# Patient Record
Sex: Female | Born: 1950 | Race: White | Hispanic: No | Marital: Single | Smoking: Former smoker
Health system: Southern US, Community
[De-identification: ages and names within clinical notes are randomized; demographics above are authoritative.]

## PROBLEM LIST (undated history)

## (undated) DIAGNOSIS — Z974 Presence of external hearing-aid: Secondary | ICD-10-CM

## (undated) DIAGNOSIS — Z972 Presence of dental prosthetic device (complete) (partial): Secondary | ICD-10-CM

## (undated) DIAGNOSIS — C801 Malignant (primary) neoplasm, unspecified: Secondary | ICD-10-CM

## (undated) DIAGNOSIS — K08109 Complete loss of teeth, unspecified cause, unspecified class: Secondary | ICD-10-CM

## (undated) DIAGNOSIS — G43909 Migraine, unspecified, not intractable, without status migrainosus: Secondary | ICD-10-CM

## (undated) DIAGNOSIS — Z973 Presence of spectacles and contact lenses: Secondary | ICD-10-CM

## (undated) DIAGNOSIS — I219 Acute myocardial infarction, unspecified: Secondary | ICD-10-CM

## (undated) DIAGNOSIS — B1920 Unspecified viral hepatitis C without hepatic coma: Secondary | ICD-10-CM

## (undated) DIAGNOSIS — I639 Cerebral infarction, unspecified: Secondary | ICD-10-CM

## (undated) DIAGNOSIS — I10 Essential (primary) hypertension: Secondary | ICD-10-CM

## (undated) HISTORY — PX: ABDOMINAL HYSTERECTOMY: SHX81

## (undated) HISTORY — PX: MULTIPLE TOOTH EXTRACTIONS: SHX2053

## (undated) HISTORY — PX: BUNIONECTOMY: SHX129

## (undated) HISTORY — PX: COLONOSCOPY: SHX174

---

## 2013-12-28 DIAGNOSIS — I639 Cerebral infarction, unspecified: Secondary | ICD-10-CM

## 2013-12-28 HISTORY — DX: Cerebral infarction, unspecified: I63.9

## 2014-09-11 ENCOUNTER — Encounter (HOSPITAL_COMMUNITY): Payer: Medicare Other | Admitting: Anesthesiology

## 2014-09-11 ENCOUNTER — Inpatient Hospital Stay (HOSPITAL_COMMUNITY): Payer: Medicare Other

## 2014-09-11 ENCOUNTER — Emergency Department (HOSPITAL_COMMUNITY): Payer: Medicare Other

## 2014-09-11 ENCOUNTER — Inpatient Hospital Stay (HOSPITAL_COMMUNITY)
Admission: EM | Admit: 2014-09-11 | Discharge: 2014-09-24 | DRG: 003 | Disposition: A | Discharge status: Rehabilitation Facility | Payer: Medicare Other | Attending: Neurosurgery | Admitting: Neurosurgery

## 2014-09-11 ENCOUNTER — Inpatient Hospital Stay (HOSPITAL_COMMUNITY): Payer: Medicare Other | Admitting: Anesthesiology

## 2014-09-11 ENCOUNTER — Encounter (HOSPITAL_COMMUNITY): Payer: Self-pay | Admitting: Emergency Medicine

## 2014-09-11 ENCOUNTER — Encounter (HOSPITAL_COMMUNITY)
Admission: EM | Disposition: A | Discharge status: Rehabilitation Facility | Payer: Self-pay | Source: Home / Self Care | Attending: Neurosurgery

## 2014-09-11 DIAGNOSIS — I509 Heart failure, unspecified: Secondary | ICD-10-CM | POA: Diagnosis present

## 2014-09-11 DIAGNOSIS — R414 Neurologic neglect syndrome: Secondary | ICD-10-CM | POA: Diagnosis present

## 2014-09-11 DIAGNOSIS — J4489 Other specified chronic obstructive pulmonary disease: Secondary | ICD-10-CM | POA: Diagnosis present

## 2014-09-11 DIAGNOSIS — R633 Feeding difficulties: Secondary | ICD-10-CM

## 2014-09-11 DIAGNOSIS — I5181 Takotsubo syndrome: Secondary | ICD-10-CM | POA: Diagnosis present

## 2014-09-11 DIAGNOSIS — I10 Essential (primary) hypertension: Secondary | ICD-10-CM | POA: Diagnosis present

## 2014-09-11 DIAGNOSIS — J449 Chronic obstructive pulmonary disease, unspecified: Secondary | ICD-10-CM | POA: Diagnosis present

## 2014-09-11 DIAGNOSIS — B1920 Unspecified viral hepatitis C without hepatic coma: Secondary | ICD-10-CM | POA: Diagnosis present

## 2014-09-11 DIAGNOSIS — E872 Acidosis, unspecified: Secondary | ICD-10-CM | POA: Diagnosis present

## 2014-09-11 DIAGNOSIS — I609 Nontraumatic subarachnoid hemorrhage, unspecified: Secondary | ICD-10-CM | POA: Diagnosis present

## 2014-09-11 DIAGNOSIS — G4700 Insomnia, unspecified: Secondary | ICD-10-CM | POA: Diagnosis not present

## 2014-09-11 DIAGNOSIS — E870 Hyperosmolality and hypernatremia: Secondary | ICD-10-CM | POA: Diagnosis not present

## 2014-09-11 DIAGNOSIS — F17200 Nicotine dependence, unspecified, uncomplicated: Secondary | ICD-10-CM | POA: Diagnosis present

## 2014-09-11 DIAGNOSIS — Z8619 Personal history of other infectious and parasitic diseases: Secondary | ICD-10-CM

## 2014-09-11 DIAGNOSIS — I214 Non-ST elevation (NSTEMI) myocardial infarction: Secondary | ICD-10-CM | POA: Diagnosis present

## 2014-09-11 DIAGNOSIS — I2789 Other specified pulmonary heart diseases: Secondary | ICD-10-CM | POA: Diagnosis present

## 2014-09-11 DIAGNOSIS — J810 Acute pulmonary edema: Secondary | ICD-10-CM

## 2014-09-11 DIAGNOSIS — J690 Pneumonitis due to inhalation of food and vomit: Secondary | ICD-10-CM | POA: Diagnosis not present

## 2014-09-11 DIAGNOSIS — G894 Chronic pain syndrome: Secondary | ICD-10-CM | POA: Diagnosis present

## 2014-09-11 DIAGNOSIS — I959 Hypotension, unspecified: Secondary | ICD-10-CM | POA: Diagnosis not present

## 2014-09-11 DIAGNOSIS — IMO0002 Reserved for concepts with insufficient information to code with codable children: Secondary | ICD-10-CM | POA: Diagnosis present

## 2014-09-11 DIAGNOSIS — I498 Other specified cardiac arrhythmias: Secondary | ICD-10-CM | POA: Diagnosis not present

## 2014-09-11 DIAGNOSIS — R7309 Other abnormal glucose: Secondary | ICD-10-CM | POA: Diagnosis present

## 2014-09-11 DIAGNOSIS — I2489 Other forms of acute ischemic heart disease: Secondary | ICD-10-CM | POA: Diagnosis present

## 2014-09-11 DIAGNOSIS — I608 Other nontraumatic subarachnoid hemorrhage: Secondary | ICD-10-CM | POA: Diagnosis present

## 2014-09-11 DIAGNOSIS — F1210 Cannabis abuse, uncomplicated: Secondary | ICD-10-CM | POA: Diagnosis present

## 2014-09-11 DIAGNOSIS — G9340 Encephalopathy, unspecified: Secondary | ICD-10-CM | POA: Diagnosis present

## 2014-09-11 DIAGNOSIS — E876 Hypokalemia: Secondary | ICD-10-CM | POA: Clinically undetermined

## 2014-09-11 DIAGNOSIS — R6339 Other feeding difficulties: Secondary | ICD-10-CM

## 2014-09-11 DIAGNOSIS — Z930 Tracheostomy status: Secondary | ICD-10-CM

## 2014-09-11 DIAGNOSIS — G8190 Hemiplegia, unspecified affecting unspecified side: Secondary | ICD-10-CM | POA: Diagnosis present

## 2014-09-11 DIAGNOSIS — F19939 Other psychoactive substance use, unspecified with withdrawal, unspecified: Secondary | ICD-10-CM | POA: Diagnosis present

## 2014-09-11 DIAGNOSIS — I2589 Other forms of chronic ischemic heart disease: Secondary | ICD-10-CM | POA: Diagnosis present

## 2014-09-11 DIAGNOSIS — I671 Cerebral aneurysm, nonruptured: Secondary | ICD-10-CM | POA: Diagnosis present

## 2014-09-11 DIAGNOSIS — G40309 Generalized idiopathic epilepsy and epileptic syndromes, not intractable, without status epilepticus: Secondary | ICD-10-CM | POA: Diagnosis present

## 2014-09-11 DIAGNOSIS — J9601 Acute respiratory failure with hypoxia: Secondary | ICD-10-CM

## 2014-09-11 DIAGNOSIS — I248 Other forms of acute ischemic heart disease: Secondary | ICD-10-CM | POA: Diagnosis present

## 2014-09-11 DIAGNOSIS — J9620 Acute and chronic respiratory failure, unspecified whether with hypoxia or hypercapnia: Secondary | ICD-10-CM | POA: Diagnosis present

## 2014-09-11 DIAGNOSIS — I428 Other cardiomyopathies: Secondary | ICD-10-CM | POA: Diagnosis present

## 2014-09-11 DIAGNOSIS — R000 Tachycardia, unspecified: Secondary | ICD-10-CM | POA: Diagnosis present

## 2014-09-11 DIAGNOSIS — I429 Cardiomyopathy, unspecified: Secondary | ICD-10-CM

## 2014-09-11 DIAGNOSIS — K449 Diaphragmatic hernia without obstruction or gangrene: Secondary | ICD-10-CM | POA: Diagnosis present

## 2014-09-11 DIAGNOSIS — F1120 Opioid dependence, uncomplicated: Secondary | ICD-10-CM | POA: Diagnosis present

## 2014-09-11 DIAGNOSIS — R4020 Unspecified coma: Secondary | ICD-10-CM

## 2014-09-11 DIAGNOSIS — R4182 Altered mental status, unspecified: Secondary | ICD-10-CM | POA: Diagnosis present

## 2014-09-11 DIAGNOSIS — I4460 Unspecified fascicular block: Secondary | ICD-10-CM | POA: Diagnosis present

## 2014-09-11 DIAGNOSIS — R40244 Other coma, without documented Glasgow coma scale score, or with partial score reported, unspecified time: Secondary | ICD-10-CM

## 2014-09-11 DIAGNOSIS — J9600 Acute respiratory failure, unspecified whether with hypoxia or hypercapnia: Secondary | ICD-10-CM

## 2014-09-11 HISTORY — DX: Malignant (primary) neoplasm, unspecified: C80.1

## 2014-09-11 HISTORY — PX: RADIOLOGY WITH ANESTHESIA: SHX6223

## 2014-09-11 HISTORY — DX: Essential (primary) hypertension: I10

## 2014-09-11 HISTORY — DX: Unspecified viral hepatitis C without hepatic coma: B19.20

## 2014-09-11 HISTORY — DX: Migraine, unspecified, not intractable, without status migrainosus: G43.909

## 2014-09-11 LAB — COMPREHENSIVE METABOLIC PANEL
ALK PHOS: 77 U/L (ref 39–117)
ALT: 10 U/L (ref 0–35)
AST: 16 U/L (ref 0–37)
Albumin: 3.5 g/dL (ref 3.5–5.2)
Anion gap: 16 — ABNORMAL HIGH (ref 5–15)
BUN: 13 mg/dL (ref 6–23)
CHLORIDE: 102 meq/L (ref 96–112)
CO2: 21 meq/L (ref 19–32)
CREATININE: 0.62 mg/dL (ref 0.50–1.10)
Calcium: 8.5 mg/dL (ref 8.4–10.5)
GFR calc Af Amer: >90 mL/min (ref >90)
Glucose, Bld: 249 mg/dL — ABNORMAL HIGH (ref 70–99)
Potassium: 3.1 mEq/L — ABNORMAL LOW (ref 3.7–5.3)
Sodium: 139 mEq/L (ref 137–147)
Total Bilirubin: 0.3 mg/dL (ref 0.3–1.2)
Total Protein: 6.5 g/dL (ref 6.0–8.3)

## 2014-09-11 LAB — I-STAT CHEM 8, ED
BUN: 13 mg/dL (ref 6–23)
BUN: 20 mg/dL (ref 6–23)
CALCIUM ION: 0.96 mmol/L — AB (ref 1.13–1.30)
CALCIUM ION: 1.06 mmol/L — AB (ref 1.13–1.30)
CHLORIDE: 105 meq/L (ref 96–112)
CREATININE: 0.7 mg/dL (ref 0.50–1.10)
Chloride: 104 mEq/L (ref 96–112)
Creatinine, Ser: 0.8 mg/dL (ref 0.50–1.10)
GLUCOSE: 258 mg/dL — AB (ref 70–99)
GLUCOSE: 249 mg/dL — AB (ref 70–99)
HCT: 45 % (ref 36.0–46.0)
HCT: 48 % — ABNORMAL HIGH (ref 36.0–46.0)
HEMOGLOBIN: 15.3 g/dL — AB (ref 12.0–15.0)
Hemoglobin: 16.3 g/dL — ABNORMAL HIGH (ref 12.0–15.0)
Potassium: 3 mEq/L — ABNORMAL LOW (ref 3.7–5.3)
Potassium: 7.5 mEq/L (ref 3.7–5.3)
Sodium: 135 mEq/L — ABNORMAL LOW (ref 137–147)
Sodium: 140 mEq/L (ref 137–147)
TCO2: 21 mmol/L (ref 0–100)
TCO2: 24 mmol/L (ref 0–100)

## 2014-09-11 LAB — POCT I-STAT 7, (LYTES, BLD GAS, ICA,H+H)
Acid-base deficit: 6 mmol/L — ABNORMAL HIGH (ref 0.0–2.0)
Bicarbonate: 22.1 mEq/L (ref 20.0–24.0)
Calcium, Ion: 1.11 mmol/L — ABNORMAL LOW (ref 1.13–1.30)
HCT: 39 % (ref 36.0–46.0)
Hemoglobin: 13.3 g/dL (ref 12.0–15.0)
O2 SAT: 99 %
PH ART: 7.248 — AB (ref 7.350–7.450)
PO2 ART: 144 mmHg — AB (ref 80.0–100.0)
Patient temperature: 36
Potassium: 2.8 mEq/L — CL (ref 3.7–5.3)
Sodium: 141 mEq/L (ref 137–147)
TCO2: 24 mmol/L (ref 0–100)
pCO2 arterial: 50 mmHg — ABNORMAL HIGH (ref 35.0–45.0)

## 2014-09-11 LAB — POCT I-STAT 3, ART BLOOD GAS (G3+)
Acid-base deficit: 9 mmol/L — ABNORMAL HIGH (ref 0.0–2.0)
Bicarbonate: 19.1 mEq/L — ABNORMAL LOW (ref 20.0–24.0)
O2 Saturation: 94 %
PH ART: 7.211 — AB (ref 7.350–7.450)
Patient temperature: 97.9
TCO2: 21 mmol/L (ref 0–100)
pCO2 arterial: 47.5 mmHg — ABNORMAL HIGH (ref 35.0–45.0)
pO2, Arterial: 87 mmHg (ref 80.0–100.0)

## 2014-09-11 LAB — I-STAT TROPONIN, ED: TROPONIN I, POC: 0.31 ng/mL — AB (ref 0.00–0.08)

## 2014-09-11 LAB — CBC WITH DIFFERENTIAL/PLATELET
BASOS ABS: 0.1 10*3/uL (ref 0.0–0.1)
Basophils Relative: 0 % (ref 0–1)
Eosinophils Absolute: 0.3 10*3/uL (ref 0.0–0.7)
Eosinophils Relative: 1 % (ref 0–5)
HEMATOCRIT: 43.3 % (ref 36.0–46.0)
HEMOGLOBIN: 14.3 g/dL (ref 12.0–15.0)
Lymphocytes Relative: 17 % (ref 12–46)
Lymphs Abs: 3.9 10*3/uL (ref 0.7–4.0)
MCH: 30.4 pg (ref 26.0–34.0)
MCHC: 33 g/dL (ref 30.0–36.0)
MCV: 91.9 fL (ref 78.0–100.0)
MONO ABS: 1.2 10*3/uL — AB (ref 0.1–1.0)
MONOS PCT: 5 % (ref 3–12)
NEUTROS ABS: 17.9 10*3/uL — AB (ref 1.7–7.7)
Neutrophils Relative %: 77 % (ref 43–77)
Platelets: 315 10*3/uL (ref 150–400)
RBC: 4.71 MIL/uL (ref 3.87–5.11)
RDW: 13.5 % (ref 11.5–15.5)
WBC: 23.4 10*3/uL — AB (ref 4.0–10.5)

## 2014-09-11 LAB — CBC
HCT: 39.8 % (ref 36.0–46.0)
Hemoglobin: 13.3 g/dL (ref 12.0–15.0)
MCH: 30.2 pg (ref 26.0–34.0)
MCHC: 33.4 g/dL (ref 30.0–36.0)
MCV: 90.5 fL (ref 78.0–100.0)
PLATELETS: 301 10*3/uL (ref 150–400)
RBC: 4.4 MIL/uL (ref 3.87–5.11)
RDW: 13.8 % (ref 11.5–15.5)
WBC: 17.8 10*3/uL — ABNORMAL HIGH (ref 4.0–10.5)

## 2014-09-11 LAB — POCT I-STAT 4, (NA,K, GLUC, HGB,HCT)
GLUCOSE: 197 mg/dL — AB (ref 70–99)
HCT: 39 % (ref 36.0–46.0)
Hemoglobin: 13.3 g/dL (ref 12.0–15.0)
POTASSIUM: 2.9 meq/L — AB (ref 3.7–5.3)
Sodium: 141 mEq/L (ref 137–147)

## 2014-09-11 LAB — PRO B NATRIURETIC PEPTIDE: Pro B Natriuretic peptide (BNP): 3137 pg/mL — ABNORMAL HIGH (ref 0–125)

## 2014-09-11 LAB — PROTIME-INR
INR: 1.04 (ref 0.00–1.49)
PROTHROMBIN TIME: 13.6 s (ref 11.6–15.2)

## 2014-09-11 LAB — APTT: aPTT: 26 seconds (ref 24–37)

## 2014-09-11 LAB — TROPONIN I: Troponin I: 5.1 ng/mL (ref <0.30)

## 2014-09-11 LAB — ETHANOL

## 2014-09-11 LAB — MRSA PCR SCREENING: MRSA by PCR: NEGATIVE

## 2014-09-11 SURGERY — RADIOLOGY WITH ANESTHESIA
Anesthesia: General

## 2014-09-11 MED ORDER — BISACODYL 10 MG RE SUPP: 10.0000 mg | Freq: Every day | RECTAL | Status: DC | PRN

## 2014-09-11 MED ORDER — LIDOCAINE HCL 1 % IJ SOLN
INTRAMUSCULAR | Status: AC
  Filled 2014-09-11: qty 20

## 2014-09-11 MED ORDER — NIMODIPINE 30 MG PO CAPS
60.0000 mg | ORAL_CAPSULE | ORAL | Status: DC
  Administered 2014-09-12 – 2014-09-19 (×13): 60 mg via ORAL
  Filled 2014-09-11 (×82): qty 2

## 2014-09-11 MED ORDER — ACETAMINOPHEN 650 MG RE SUPP
650.0000 mg | RECTAL | Status: DC | PRN
  Administered 2014-09-14 – 2014-09-21 (×7): 650 mg via RECTAL
  Filled 2014-09-11 (×8): qty 1

## 2014-09-11 MED ORDER — ALBUTEROL SULFATE (2.5 MG/3ML) 0.083% IN NEBU
2.5000 mg | INHALATION_SOLUTION | RESPIRATORY_TRACT | Status: DC | PRN
Start: 2014-09-11 — End: 2014-09-24

## 2014-09-11 MED ORDER — LEVETIRACETAM IN NACL 1000 MG/100ML IV SOLN
1000.0000 mg | Freq: Once | INTRAVENOUS | Status: DC
  Filled 2014-09-11: qty 100

## 2014-09-11 MED ORDER — FENTANYL CITRATE 0.05 MG/ML IJ SOLN
25.0000 ug | INTRAMUSCULAR | Status: DC | PRN
  Administered 2014-09-11: 100 ug via INTRAVENOUS
  Administered 2014-09-12: 50 ug via INTRAVENOUS
  Filled 2014-09-11 (×3): qty 2

## 2014-09-11 MED ORDER — PROTAMINE SULFATE 10 MG/ML IV SOLN
INTRAVENOUS | Status: DC | PRN
  Administered 2014-09-11: 20 mg via INTRAVENOUS

## 2014-09-11 MED ORDER — BUDESONIDE 0.25 MG/2ML IN SUSP
0.2500 mg | Freq: Two times a day (BID) | RESPIRATORY_TRACT | Status: DC
  Administered 2014-09-11 – 2014-09-12 (×2): 0.25 mg via RESPIRATORY_TRACT
  Filled 2014-09-11 (×4): qty 2

## 2014-09-11 MED ORDER — HYDROMORPHONE HCL PF 1 MG/ML IJ SOLN: 0.2500 mg | INTRAMUSCULAR | Status: DC | PRN

## 2014-09-11 MED ORDER — PHENYLEPHRINE HCL 10 MG/ML IJ SOLN
INTRAMUSCULAR | Status: DC | PRN
  Administered 2014-09-11 (×4): 80 ug via INTRAVENOUS

## 2014-09-11 MED ORDER — LIDOCAINE HCL (CARDIAC) 20 MG/ML IV SOLN
INTRAVENOUS | Status: DC | PRN
  Administered 2014-09-11: 50 mg via INTRAVENOUS

## 2014-09-11 MED ORDER — PANTOPRAZOLE SODIUM 40 MG IV SOLR
40.0000 mg | Freq: Every day | INTRAVENOUS | Status: DC
  Administered 2014-09-11 – 2014-09-23 (×13): 40 mg via INTRAVENOUS
  Filled 2014-09-11 (×16): qty 40

## 2014-09-11 MED ORDER — LEVETIRACETAM IN NACL 500 MG/100ML IV SOLN
500.0000 mg | Freq: Two times a day (BID) | INTRAVENOUS | Status: DC
  Administered 2014-09-12 – 2014-09-24 (×26): 500 mg via INTRAVENOUS
  Filled 2014-09-11 (×27): qty 100

## 2014-09-11 MED ORDER — HYDROMORPHONE HCL PF 1 MG/ML IJ SOLN
1.0000 mg | INTRAMUSCULAR | Status: DC | PRN
  Administered 2014-09-12 (×2): 1 mg via INTRAVENOUS
  Filled 2014-09-11 (×2): qty 1

## 2014-09-11 MED ORDER — PROPOFOL 10 MG/ML IV EMUL
5.0000 ug/kg/min | INTRAVENOUS | Status: DC
  Administered 2014-09-11: 25 ug/kg/min via INTRAVENOUS
  Filled 2014-09-11 (×2): qty 100

## 2014-09-11 MED ORDER — IPRATROPIUM-ALBUTEROL 0.5-2.5 (3) MG/3ML IN SOLN
RESPIRATORY_TRACT | Status: AC
Start: 2014-09-11 — End: 2014-09-12
  Administered 2014-09-12: 3 mL via RESPIRATORY_TRACT
  Filled 2014-09-11: qty 3

## 2014-09-11 MED ORDER — IPRATROPIUM-ALBUTEROL 0.5-2.5 (3) MG/3ML IN SOLN
3.0000 mL | RESPIRATORY_TRACT | Status: DC
  Administered 2014-09-11 – 2014-09-16 (×31): 3 mL via RESPIRATORY_TRACT
  Filled 2014-09-11 (×30): qty 3

## 2014-09-11 MED ORDER — SODIUM CHLORIDE 0.9 % IV SOLN: INTRAVENOUS | Status: DC

## 2014-09-11 MED ORDER — DOCUSATE SODIUM 100 MG PO CAPS
100.0000 mg | ORAL_CAPSULE | Freq: Two times a day (BID) | ORAL | Status: DC
  Administered 2014-09-12: 100 mg via ORAL
  Filled 2014-09-11 (×7): qty 1

## 2014-09-11 MED ORDER — POTASSIUM CHLORIDE 10 MEQ/100ML IV SOLN
INTRAVENOUS | Status: DC | PRN
  Administered 2014-09-11 (×2): 10 meq via INTRAVENOUS

## 2014-09-11 MED ORDER — LABETALOL HCL 5 MG/ML IV SOLN: 10.0000 mg | INTRAVENOUS | Status: DC | PRN

## 2014-09-11 MED ORDER — POTASSIUM CHLORIDE 20 MEQ/15ML (10%) PO LIQD
40.0000 meq | Freq: Three times a day (TID) | ORAL | Status: DC
  Administered 2014-09-11: 40 meq
  Filled 2014-09-11 (×2): qty 30

## 2014-09-11 MED ORDER — SODIUM CHLORIDE 0.9 % IV SOLN
INTRAVENOUS | Status: DC | PRN
  Administered 2014-09-11: 16:00:00 via INTRAVENOUS

## 2014-09-11 MED ORDER — POTASSIUM CHLORIDE 10 MEQ/100ML IV SOLN
10.0000 meq | INTRAVENOUS | Status: AC
  Filled 2014-09-11 (×2): qty 100

## 2014-09-11 MED ORDER — LEVETIRACETAM IN NACL 1000 MG/100ML IV SOLN
1000.0000 mg | INTRAVENOUS | Status: AC
  Administered 2014-09-11: 1000 mg via INTRAVENOUS
  Filled 2014-09-11: qty 100

## 2014-09-11 MED ORDER — PHENYLEPHRINE HCL 10 MG/ML IJ SOLN
10.0000 mg | INTRAVENOUS | Status: DC | PRN
  Administered 2014-09-11: 15 ug/min via INTRAVENOUS

## 2014-09-11 MED ORDER — PROPOFOL INFUSION 10 MG/ML OPTIME
INTRAVENOUS | Status: DC | PRN
  Administered 2014-09-11: 50 ug/kg/min via INTRAVENOUS

## 2014-09-11 MED ORDER — LEVETIRACETAM IN NACL 500 MG/100ML IV SOLN
500.0000 mg | Freq: Two times a day (BID) | INTRAVENOUS | Status: DC
  Filled 2014-09-11: qty 100

## 2014-09-11 MED ORDER — MORPHINE SULFATE 2 MG/ML IJ SOLN: 1.0000 mg | INTRAMUSCULAR | Status: DC | PRN

## 2014-09-11 MED ORDER — POLYETHYLENE GLYCOL 3350 17 G PO PACK
17.0000 g | PACK | Freq: Every day | ORAL | Status: DC | PRN
  Filled 2014-09-11: qty 1

## 2014-09-11 MED ORDER — ACETAMINOPHEN 325 MG PO TABS
650.0000 mg | ORAL_TABLET | ORAL | Status: DC | PRN
Start: 2014-09-11 — End: 2014-09-15
  Administered 2014-09-12 (×2): 650 mg via ORAL
  Filled 2014-09-11 (×2): qty 2

## 2014-09-11 MED ORDER — IOHEXOL 300 MG/ML  SOLN
150.0000 mL | Freq: Once | INTRAMUSCULAR | Status: AC | PRN
  Administered 2014-09-11: 1 mL via INTRAVENOUS

## 2014-09-11 MED ORDER — SODIUM CHLORIDE 0.9 % IV SOLN
INTRAVENOUS | Status: DC
Start: 2014-09-11 — End: 2014-09-11

## 2014-09-11 MED ORDER — SENNOSIDES-DOCUSATE SODIUM 8.6-50 MG PO TABS
1.0000 | ORAL_TABLET | Freq: Two times a day (BID) | ORAL | Status: DC
  Administered 2014-09-11 – 2014-09-12 (×2): 1 via ORAL
  Filled 2014-09-11 (×7): qty 1

## 2014-09-11 MED ORDER — PROPOFOL 10 MG/ML IV BOLUS
INTRAVENOUS | Status: DC | PRN
  Administered 2014-09-11: 100 mg via INTRAVENOUS

## 2014-09-11 MED ORDER — ROCURONIUM BROMIDE 100 MG/10ML IV SOLN
INTRAVENOUS | Status: DC | PRN
  Administered 2014-09-11 (×2): 10 mg via INTRAVENOUS
  Administered 2014-09-11: 20 mg via INTRAVENOUS
  Administered 2014-09-11: 40 mg via INTRAVENOUS
  Administered 2014-09-11: 10 mg via INTRAVENOUS

## 2014-09-11 MED ORDER — SODIUM CHLORIDE 0.9 % IV SOLN
INTRAVENOUS | Status: DC
  Administered 2014-09-11: 125 mL/h via INTRAVENOUS

## 2014-09-11 MED ORDER — ADULT MULTIVITAMIN W/MINERALS CH
1.0000 | ORAL_TABLET | Freq: Every day | ORAL | Status: DC
  Administered 2014-09-11 – 2014-09-15 (×3): 1 via ORAL
  Filled 2014-09-11 (×5): qty 1

## 2014-09-11 MED ORDER — SIMVASTATIN 80 MG PO TABS
80.0000 mg | ORAL_TABLET | Freq: Every day | ORAL | Status: DC
  Administered 2014-09-11 – 2014-09-12 (×2): 80 mg via ORAL
  Filled 2014-09-11 (×4): qty 1

## 2014-09-11 MED ORDER — ONDANSETRON HCL 4 MG/2ML IJ SOLN
4.0000 mg | Freq: Once | INTRAMUSCULAR | Status: AC
  Administered 2014-09-11: 4 mg via INTRAVENOUS

## 2014-09-11 MED ORDER — ALBUMIN HUMAN 5 % IV SOLN
INTRAVENOUS | Status: DC | PRN
  Administered 2014-09-11: 18:00:00 via INTRAVENOUS

## 2014-09-11 MED ORDER — ONDANSETRON HCL 4 MG/2ML IJ SOLN: 4.0000 mg | Freq: Once | INTRAMUSCULAR | Status: DC | PRN

## 2014-09-11 MED ORDER — NIMODIPINE 60 MG/20ML PO SOLN
60.0000 mg | ORAL | Status: DC
  Administered 2014-09-11 – 2014-09-24 (×46): 60 mg
  Filled 2014-09-11 (×99): qty 20

## 2014-09-11 MED ORDER — ONDANSETRON HCL 4 MG/2ML IJ SOLN
INTRAMUSCULAR | Status: AC
  Filled 2014-09-11: qty 2

## 2014-09-11 MED ORDER — FENTANYL CITRATE 0.05 MG/ML IJ SOLN
INTRAMUSCULAR | Status: DC | PRN
  Administered 2014-09-11: 25 ug via INTRAVENOUS
  Administered 2014-09-11: 50 ug via INTRAVENOUS
  Administered 2014-09-11: 25 ug via INTRAVENOUS
  Administered 2014-09-11: 50 ug via INTRAVENOUS

## 2014-09-11 MED ORDER — SENNA 8.6 MG PO TABS
1.0000 | ORAL_TABLET | Freq: Two times a day (BID) | ORAL | Status: DC
  Administered 2014-09-12 (×2): 8.6 mg via ORAL
  Filled 2014-09-11 (×7): qty 1

## 2014-09-11 NOTE — ED Notes (Addendum)
Per EMS, pt began having seizure like activity. Upon EMS arrival pt was having agonal breathing and assisted with BVM. Pt then starting breathing on her own at a rate of 20. Pt placed on NRB and Nasal trumpet placed, SpO2 remained at 100% in route. Upon pt arrival Dr. Alvino Chapel at the bedside. Pt not moving right side at all at this time. Pt has clonus of the right foot.

## 2014-09-11 NOTE — Progress Notes (Signed)
CRITICAL VALUE ALERT  Critical value received:  Troponin 5.1  Date of notification:  09/11/14  Time of notification:  22:31  Critical value read back:Yes.    Nurse who received alert:  Vita Barley, RN  MD notified (1st page):  Dr. Chase Caller  Time of first page:  22:32  MD notified (2nd page):  Time of second page:  Responding MD:  Dr. Chase Caller  Time MD responded:  22:32

## 2014-09-11 NOTE — Brief Op Note (Signed)
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 Consuella Lose, MD  ANESTHESIA: IV Sedation with Local  EBL: Minimal  SPECIMENS: None  COMPLICATIONS: None  CONDITION: Stable to recovery  FINDINGS: 1. 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 During placement of the framing coil, the coil appeared to be extraluminal, however there is no change in vital signs. Immediate angiogram did not demonstrate contrast extravasation. 2. there is no evidence of vasospasm at this time. 3. No other intracranial aneurysms, AVMs, or Hypofix chills are seen.

## 2014-09-11 NOTE — Progress Notes (Addendum)
[facility] Progress Note Patient Name: Mindy Mills DOB: [birth year] MRN: [id number]   Date of Service  09/11/2014  HPI/Events of Note  Elevated troponin 5.1, in the setting of recent fall, and SAH  eICU Interventions  Demand ischemia possibly, heparin not indicated at this time, trend troponin x 2 q6hrs, ekg     Intervention Category Intermediate Interventions: Other:  Jacoya Bauman 09/11/2014, 11:29 PM

## 2014-09-11 NOTE — Anesthesia Preprocedure Evaluation (Signed)
Anesthesia Evaluation  Patient identified by MRN, date of birth, ID band Patient confused    Reviewed: Patient's Chart, lab work & pertinent test results  Airway Mallampati: II TM Distance: >3 FB     Dental  (+) Edentulous Upper, Edentulous Lower   Pulmonary Current Smoker,  breath sounds clear to auscultation        Cardiovascular hypertension, Rhythm:Regular Rate:Tachycardia     Neuro/Psych    GI/Hepatic   Endo/Other    Renal/GU      Musculoskeletal   Abdominal   Peds  Hematology   Anesthesia Other Findings   Reproductive/Obstetrics                           Anesthesia Physical Anesthesia Plan  ASA: IV and emergent  Anesthesia Plan: General   Post-op Pain Management:    Induction: Intravenous  Airway Management Planned: Oral ETT  Additional Equipment: Arterial line  Intra-op Plan:   Post-operative Plan: Post-operative intubation/ventilation  Informed Consent: I have reviewed the patients History and Physical, chart, labs and discussed the procedure including the risks, benefits and alternatives for the proposed anesthesia with the patient or authorized representative who has indicated his/her understanding and acceptance.     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 Patient presented today with a syncopal episode followed by a grand mal seizure. Now moving all extremities but minimally responsive to commands. She has a history of Hepatitis C, hypertension and migraines per her records. No home meds or allergies on file Labs are notable for glucose 249 and WBC 24,000.  Plan GA with art line and subglottic ETT, with post-op ventilation.  Roberts Gaudy, MD    )        Anesthesia Quick Evaluation

## 2014-09-11 NOTE — Progress Notes (Signed)
I reviewed the patients history and spoke with her son. She apparently was in the car and had a SZ. Her son is an EMT and noted agonal breaths and gave rescue breaths. EMS arrived and brought her to [facility].  Her relevant medical history includes tobacco smoking, hypertension.  EXAM: BP 144/82  Pulse 103  Temp(Src) 98.1 F (36.7 C) (Oral)  Resp 34  SpO2 96%  Drowsy but arousable to voice Oriented to person, hospital Follows simple commands, MAE  IMAGING: CT demonstrates diffuse SAH with thick interhemispheric clot. CTA demonstrates medially and inferiorly projecting right A1-A2 jxn aneurysm, appears to be relatively narrow neck.  IMPRESSION: - 63 y.o. woman, Hunt-Hess 3, Fisher 3 SAH with likely A1-A2 junction aneurysm as source of hemorrhage  PLAN: - Diagnostic cerebral angiogram with likely coil embolization  I spoke at length with the patient and her family regarding the imaging findings thus far. I explained to them that intracranial aneurysm was the most common non-traumatic cause for [facility] and that an Acom aneurysm was seen on CTA. I also explained to them the possible treatment options for intracranial aneurysms including endovascular coiling and open clip ligation. The risks of the angiogram, coiling, and surgical clipping were also reviewed to include stroke and aneurysm re-rupture leading to weakness/paralysis/coma/death, infection, SZ, hydrocephalus. I also talked to them about the possibility of ventriculostomy for CSF drainage and the risks of this procedure.  The patient and her family understood our discussion and the provided consent to proceed with diagnostic angiogram and the appropriate treatment for any identified aneurysm.

## 2014-09-11 NOTE — Progress Notes (Signed)
Escorted to 3M05 via bed with CRNA and RT R femoral groin site c/d/i, + pulses to RLE. Endorsed to [employer]

## 2014-09-11 NOTE — Progress Notes (Addendum)
   RT called with abg results   7.21/47/87 on peep 5, Vt 420, RR 16 on vent with patient breathing at 20  Camera care shows she is sedaetd, and synch with vent without air trapping  Per RT   - patient has hx of copd  -  Plan  continue current vent settings; allow for permissive hypercapnia  start scheduled nebs  Dr. Brand Males, M.D., [facility].C.P Pulmonary and Critical Care Medicine Staff Physician [facility] Pager: [phone], If no answer or between  15:00h - 7:00h: call [phone]  09/11/2014 8:54 PM

## 2014-09-11 NOTE — ED Notes (Signed)
NOTIFIED DR Alvino Chapel OF PATIENTS PANIC LAB RESULTS OF I-STAT TNI = 0.31 ng/ml @13 :05 pm ,09/11/2014.

## 2014-09-11 NOTE — ED Notes (Signed)
Neuro Surgery at the bedside.

## 2014-09-11 NOTE — H&P (Signed)
Mindy Mills is an 63 y.o. female.   Chief Complaint: Subarachnoid hemorrhage secondary to anterior communicating artery aneurysm HPI: The patient is a 63 year old right-handed female who had a sudden syncopal episode with a moment of rigidity followed by a grand mal seizure. She was then noted to be flaccid barely breathing and then had some weakness on her left side. She has been very lethargic and agitated easily with any painful stimuli. She is now moving all 4 extremities. She will not follow commands. A CTA performed at the time of admission demonstrates the presence of a 4 mm anterior communicating artery aneurysm on the right. The CT scan also demonstrates a large amount of subarachnoid hemorrhage casting all the basilar cisterns and extending into both sylvian fissures. The ventricles are prominent but there is no obvious hydrocephalus at this time.  Past Medical History  Diagnosis Date  . Hypertension   . Migraine   . Cancer     Cervicle    Past Surgical History  Procedure Laterality Date  . Abdominal hysterectomy      No family history on file. Social History:  has no tobacco, alcohol, and drug history on file.  Allergies: Not on File   (Not in a hospital admission)  Results for orders placed during the hospital encounter of 09/11/14 (from the past 48 hour(s))  I-STAT TROPOININ, ED     Status: Abnormal   Collection Time    09/11/14 12:51 PM      Result Value Ref Range   Troponin i, poc 0.31 (*) 0.00 - 0.08 ng/mL   Comment NOTIFIED PHYSICIAN     Comment 3            Comment: Due to the release kinetics of cTnI,     a negative result within the first hours     of the onset of symptoms does not rule out     myocardial infarction with certainty.     If myocardial infarction is still suspected,     repeat the test at appropriate intervals.  I-STAT CHEM 8, ED     Status: Abnormal   Collection Time    09/11/14 12:53 PM      Result Value Ref Range   Sodium 135 (*) 137  - 147 mEq/L   Potassium 7.5 (*) 3.7 - 5.3 mEq/L   Chloride 105  96 - 112 mEq/L   BUN 20  6 - 23 mg/dL   Creatinine, Ser 0.80  0.50 - 1.10 mg/dL   Glucose, Bld 258 (*) 70 - 99 mg/dL   Calcium, Ion 0.96 (*) 1.13 - 1.30 mmol/L   TCO2 24  0 - 100 mmol/L   Hemoglobin 16.3 (*) 12.0 - 15.0 g/dL   HCT 48.0 (*) 36.0 - 46.0 %   Comment NOTIFIED PHYSICIAN    CBC WITH DIFFERENTIAL     Status: Abnormal   Collection Time    09/11/14  1:04 PM      Result Value Ref Range   WBC 23.4 (*) 4.0 - 10.5 K/uL   RBC 4.71  3.87 - 5.11 MIL/uL   Hemoglobin 14.3  12.0 - 15.0 g/dL   HCT 43.3  36.0 - 46.0 %   MCV 91.9  78.0 - 100.0 fL   MCH 30.4  26.0 - 34.0 pg   MCHC 33.0  30.0 - 36.0 g/dL   RDW 13.5  11.5 - 15.5 %   Platelets 315  150 - 400 K/uL   Neutrophils Relative % 77  43 - 77 %   Neutro Abs 17.9 (*) 1.7 - 7.7 K/uL   Lymphocytes Relative 17  12 - 46 %   Lymphs Abs 3.9  0.7 - 4.0 K/uL   Monocytes Relative 5  3 - 12 %   Monocytes Absolute 1.2 (*) 0.1 - 1.0 K/uL   Eosinophils Relative 1  0 - 5 %   Eosinophils Absolute 0.3  0.0 - 0.7 K/uL   Basophils Relative 0  0 - 1 %   Basophils Absolute 0.1  0.0 - 0.1 K/uL  COMPREHENSIVE METABOLIC PANEL     Status: Abnormal   Collection Time    09/11/14  1:04 PM      Result Value Ref Range   Sodium 139  137 - 147 mEq/L   Potassium 3.1 (*) 3.7 - 5.3 mEq/L   Chloride 102  96 - 112 mEq/L   CO2 21  19 - 32 mEq/L   Glucose, Bld 249 (*) 70 - 99 mg/dL   BUN 13  6 - 23 mg/dL   Creatinine, Ser 0.62  0.50 - 1.10 mg/dL   Calcium 8.5  8.4 - 10.5 mg/dL   Total Protein 6.5  6.0 - 8.3 g/dL   Albumin 3.5  3.5 - 5.2 g/dL   AST 16  0 - 37 U/L   ALT 10  0 - 35 U/L   Alkaline Phosphatase 77  39 - 117 U/L   Total Bilirubin 0.3  0.3 - 1.2 mg/dL   GFR calc non Af Amer >90  >90 mL/min   GFR calc Af Amer >90  >90 mL/min   Comment: (NOTE)     The eGFR has been calculated using the CKD EPI equation.     This calculation has not been validated in all clinical situations.      eGFR's persistently <90 mL/min signify possible Chronic Kidney     Disease.   Anion gap 16 (*) 5 - 15  ETHANOL     Status: None   Collection Time    09/11/14  1:04 PM      Result Value Ref Range   Alcohol, Ethyl (B) <11  0 - 11 mg/dL   Comment:            LOWEST DETECTABLE LIMIT FOR     SERUM ALCOHOL IS 11 mg/dL     FOR MEDICAL PURPOSES ONLY  I-STAT CHEM 8, ED     Status: Abnormal   Collection Time    09/11/14  1:25 PM      Result Value Ref Range   Sodium 140  137 - 147 mEq/L   Potassium 3.0 (*) 3.7 - 5.3 mEq/L   Chloride 104  96 - 112 mEq/L   BUN 13  6 - 23 mg/dL   Creatinine, Ser 0.70  0.50 - 1.10 mg/dL   Glucose, Bld 249 (*) 70 - 99 mg/dL   Calcium, Ion 1.06 (*) 1.13 - 1.30 mmol/L   TCO2 21  0 - 100 mmol/L   Hemoglobin 15.3 (*) 12.0 - 15.0 g/dL   HCT 45.0  36.0 - 46.0 %   Ct Angio Head W/cm &/or Wo Cm  09/11/2014   CLINICAL DATA:  Code stroke.  Left-sided deficit.  EXAM: CT ANGIOGRAPHY HEAD  TECHNIQUE: Multidetector CT imaging of the head was performed using the standard protocol during bolus administration of intravenous contrast. Multiplanar CT image reconstructions and MIPs were obtained to evaluate the vascular anatomy.  CONTRAST:  50 mL MultiHance IV  COMPARISON:  None.  FINDINGS: Large subarachnoid hemorrhage. Diffuse bilateral subarachnoid hemorrhage is present in the sylvian fissures and in the basilar cisterns. There is clot in the suprasellar cistern extending into the interhemispheric fissure. The largest area of clot is in the region of the anterior communicating artery.  Ventricles are not enlarged. No ventricular hemorrhage. There is calcified choroid in the fourth ventricle.  Negative for acute ischemic infarct. No shift of the midline structures.  Patient is intubated.  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 This has an irregular morphology measures 2.5 x 4 mm. The aneurysm projects inferiorly and is the source of subarachnoid hemorrhage. No  other aneurysm  No significant vasospasm.  Both vertebral arteries are patent to the basilar. PICA patent bilaterally. Basilar widely patent. Superior cerebellar arteries are widely patent  Internal carotid artery patent bilaterally. Mild atherosclerotic disease in the cavernous carotid bilaterally. Anterior and middle cerebral arteries are patent bilaterally.  Review of the MIP images confirms the above findings.  IMPRESSION: Large subarachnoid hemorrhage. 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 No significant vasospasm.  3D imaging was generated and saved to PACS.  Ventricle size is normal.  No acute ischemic infarct.  Critical Value/emergent results were called by telephone at the time of interpretation on 09/11/2014 at 1:16 pm to Dr. Davonna Belling , who verbally acknowledged these results.   Electronically Signed   By: Franchot Gallo M.D.   On: 09/11/2014 13:24   Dg Chest Port 1 View  09/11/2014   CLINICAL DATA:  Altered mental status.  EXAM: PORTABLE CHEST - 1 VIEW  COMPARISON:  None.  FINDINGS: The patient is slightly rotated to the right. Cardiac silhouette is within normal limits for size. Mild thoracic aortic calcification is noted. Multiple monitoring leads overlie the chest, predominantly on the right. There is pulmonary vascular congestion with interstitial densities bilaterally, greatest in the lung bases. No pleural effusion is identified, although the left lateral costophrenic angle is incompletely imaged. No pneumothorax is seen. No acute osseous abnormality is identified.  IMPRESSION: Pulmonary vascular congestion and bilateral lung opacities compatible with mild pulmonary edema.   Electronically Signed   By: Logan Bores   On: 09/11/2014 13:43    Review of Systems  Unable to perform ROS: acuity of condition    Blood pressure 144/82, pulse 103, temperature 98.1 F (36.7 C), temperature source Oral, resp. rate 34,  SpO2 96.00%. Physical Exam  Constitutional: She appears well-developed and well-nourished.  HENT:  Head: Normocephalic and atraumatic.  Eyes: Pupils are equal, round, and reactive to light.  Neck: Normal range of motion.  3+ meningismus  Respiratory: Effort normal and breath sounds normal.  GI: Soft. Bowel sounds are normal.  Musculoskeletal:  Moves all 4 extremities well to painful stimuli.  Neurological:  Arouses to loud voice. Does not follow commands. Localizes pain and discomfort with all 4 extremities. Eye opening to pain.  Skin: Skin is warm and dry.  Psychiatric: She has a normal mood and affect. Her behavior is normal. Judgment and thought content normal.     Assessment/Plan Subarachnoid hemorrhage secondary to right anterior communicating artery aneurysm.  The patient's comorbidities include a long-standing smoking history of a half pack a day, long-standing chronic pain syndrome managed with hydrocodone and oxycodone prescribed by the [place], history of hepatitis C positivity.  I have discussed the situation with Dr. Orson Slick who will assess the potential for endovascular treatment.  She'll be admitted to the intensive care unit in the antrum. Blood pressure appears stable at 140/80. Her neurologic status will be observed.  I discussed the patient's situation with her son and explained the severe nature of this process and the potential for further bleeding neurologic deterioration and/or death. He appears to understand these things.  Latiffany Harwick J 09/11/2014, 2:36 PM

## 2014-09-11 NOTE — ED Notes (Signed)
NOTIFIED DR. PICKERING IN PERSON FOR PATIENTS PANIC LAB RESULTS OF CHEM8+ K = 7.5 mmol/L ,GLU258 mg/dl ,@13 :00 pm ,09/11/2014.

## 2014-09-11 NOTE — Consult Note (Signed)
PULMONARY / CRITICAL CARE MEDICINE   Name: Mindy Mills MRN: [id number] DOB: August 07, 1951    ADMISSION DATE:  09/11/2014 CONSULTATION DATE:  09/11/2014  REFERRING MD :  Kathyrn Sheriff  CHIEF COMPLAINT:  Seizure  INITIAL PRESENTATION: 63 y.o. F brought to [facility] ED on 9/15 after a witnessed syncopal episode followed by a grand mal seizure while in the car.  Upon ED arrival, pt was unresponsive and not moving her right side.  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  She was intubated and underwent endovascular procedure by Dr. Leighton Ruff.  PCCM was consulted.  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  SIGNIFICANT EVENTS: 9/14 - admitted with [facility] and right ACA aneurysm, underwent endovascular repair, returned to ICU on vent.   HISTORY OF PRESENT ILLNESS:  Pt is encephalopathic; therefore, this HPI is obtained from chart review. Mindy Mills is a 63 y.o. F with PMH as outlined below, she presented to [facility] ED on 9/15 after she suffered a syncopal episode followed by a grand mal seizure while in the car earlier in the day.  She had agonal respirations following this and was given rescue breaths by her son who is an EMT.  On arrival to ED, pt was only responsive to painful stimuli.  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 Dr. Kathyrn Sheriff of neurosurgery was consulted for endovascular intervention which pt underwent afternoon of 9/15.  She returned to the ICU on the vent and PCCM was consulted.  PAST MEDICAL HISTORY :  Past Medical History  Diagnosis Date  . Hypertension   . Migraine   . Cancer     Cervicle  . Hepatitis C    Past Surgical History  Procedure Laterality Date  . Abdominal hysterectomy     Prior to Admission medications   Not on File   Allergies  Allergen Reactions  . Bee Venom Hives    FAMILY HISTORY:  No family history on file. SOCIAL HISTORY:  reports that she has been smoking  Cigarettes.  She has been smoking about 0.50 packs per day. She does not have any smokeless tobacco history on file. She reports that she drinks alcohol. She reports that she uses illicit drugs (Marijuana).  REVIEW OF SYSTEMS:  Unable to obtain as pt is encephalopathic.  SUBJECTIVE:   VITAL SIGNS: Temp:  [98.1 F (36.7 C)] 98.1 F (36.7 C) (09/15 1312) Pulse Rate:  [103-121] 114 (09/15 1515) Resp:  [18-34] 20 (09/15 1522) BP: (128-159)/(64-94) 156/86 mmHg (09/15 1515) SpO2:  [91 %-96 %] 96 % (09/15 1515) HEMODYNAMICS:   VENTILATOR SETTINGS:   INTAKE / OUTPUT: Intake/Output   None     PHYSICAL EXAMINATION: General: Chronically ill appearing elderly female, sedated and intubated, in NAD. Neuro: A&O x 3, non-focal.  HEENT: [place]/AT. PERRL, sclerae anicteric. Cardiovascular: RRR, no M/R/G.  Lungs: Respirations even and unlabored.  Coarse BS diffusely. Abdomen: BS x 4, soft, NT/ND.  Musculoskeletal: No gross deformities, no edema.  Skin: Intact, warm, no rashes.  LABS:  CBC  Recent Labs Lab 09/11/14 1253 09/11/14 1304 09/11/14 1325  WBC  --  23.4*  --   HGB 16.3* 14.3 15.3*  HCT 48.0* 43.3 45.0  PLT  --  315  --    Coag's No results found for this basename: APTT, INR,  in the last 168 hours BMET  Recent Labs Lab 09/11/14 1253 09/11/14 1304 09/11/14 1325  NA 135* 139 140  K 7.5* 3.1* 3.0*  CL 105 102 104  CO2  --  21  --   BUN 20 13 13   CREATININE 0.80 0.62 0.70  GLUCOSE 258* 249* 249*   Electrolytes  Recent Labs Lab 09/11/14 1304  CALCIUM 8.5   Sepsis Markers No results found for this basename: LATICACIDVEN, PROCALCITON, O2SATVEN,  in the last 168 hours  ABG No results found for this basename: PHART, PCO2ART, PO2ART,  in the last 168 hours  Liver Enzymes  Recent Labs Lab 09/11/14 1304  AST 16  ALT 10  ALKPHOS 77  BILITOT 0.3  ALBUMIN 3.5   Cardiac Enzymes No results found for this basename: TROPONINI, PROBNP,  in the last 168  hours  Glucose No results found for this basename: GLUCAP,  in the last 168 hours  Imaging Ct Angio Head W/cm &/or Wo Cm  09/11/2014   CLINICAL DATA:  Code stroke.  Left-sided deficit.  EXAM: CT ANGIOGRAPHY HEAD  TECHNIQUE: Multidetector CT imaging of the head was performed using the standard protocol during bolus administration of intravenous contrast. Multiplanar CT image reconstructions and MIPs were obtained to evaluate the vascular anatomy.  CONTRAST:  50 mL MultiHance IV  COMPARISON:  None.  FINDINGS: Large subarachnoid hemorrhage. Diffuse bilateral subarachnoid hemorrhage is present in the sylvian fissures and in the basilar cisterns. There is clot in the suprasellar cistern extending into the interhemispheric fissure. The largest area of clot is in the region of the anterior communicating artery.  Ventricles are not enlarged. No ventricular hemorrhage. There is calcified choroid in the fourth ventricle.  Negative for acute ischemic infarct. No shift of the midline structures.  Patient is intubated.  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 This has an irregular morphology measures 2.5 x 4 mm. The aneurysm projects inferiorly and is the source of subarachnoid hemorrhage. No other aneurysm  No significant vasospasm.  Both vertebral arteries are patent to the basilar. PICA patent bilaterally. Basilar widely patent. Superior cerebellar arteries are widely patent  Internal carotid artery patent bilaterally. Mild atherosclerotic disease in the cavernous carotid bilaterally. Anterior and middle cerebral arteries are patent bilaterally.  Review of the MIP images confirms the above findings.  IMPRESSION: Large subarachnoid hemorrhage. 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 No significant vasospasm.  3D imaging was generated and saved to PACS.  Ventricle size is normal.  No acute ischemic infarct.  Critical  Value/emergent results were called by telephone at the time of interpretation on 09/11/2014 at 1:16 pm to Dr. Davonna Belling , who verbally acknowledged these results.   Electronically Signed   By: Franchot Gallo M.D.   On: 09/11/2014 13:24   Dg Chest Port 1 View  09/11/2014   CLINICAL DATA:  Altered mental status.  EXAM: PORTABLE CHEST - 1 VIEW  COMPARISON:  None.  FINDINGS: The patient is slightly rotated to the right. Cardiac silhouette is within normal limits for size. Mild thoracic aortic calcification is noted. Multiple monitoring leads overlie the chest, predominantly on the right. There is pulmonary vascular congestion with interstitial densities bilaterally, greatest in the lung bases. No pleural effusion is identified, although the left lateral costophrenic angle is incompletely imaged. No pneumothorax is seen. No acute osseous abnormality is identified.  IMPRESSION: Pulmonary vascular congestion and bilateral lung opacities compatible with mild pulmonary edema.   Electronically Signed   By: Logan Bores   On: 09/11/2014  13:43   ASSESSMENT / PLAN:  PULMONARY OETT 9/15 >>> A: VDRF - in setting of acute SAH secondary to right ACA aneurysm Pulmonary edema on CXR P:   Full mechanical support, hold weaning til AM pending neuro exam. Flat on back for 8 hours post procedure. VAP bundle. SBT in AM. DuoNebs / Albuterol. ABG and CXR in AM.  CARDIOVASCULAR A:  Hx HTN P:  BP management per neuro / neurosurgery, target SBP of 160 mmHg. TTE. Pro BNP.  RENAL A:   Hypokalemia P:   NS @ 100, mainly for contrast use but patient already has pulmonary edema on CXR so will use IVF gingerly. BMP in AM. Replace electrolytes as indicated.  GASTROINTESTINAL A:   Nutrition GI prophylaxis Hx HCV P:   NPO. TF if remains NPO > 24 hours. SUP: Pantoprazole.  HEMATOLOGIC A:   VTE Prophylaxis P:  SCD's only. CBC in AM.  INFECTIOUS A:   Leukocytosis - favor stress response / acute  phase reactant as no clear indication of infectious etiology. P:   Monitor clinically.  ENDOCRINE A:   Hyperglycemia  P:   CBG's q4hr. SSI.  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 RASS goal: 0 to -1. Daily WUA. Keppra per neuro recs.   TODAY'S SUMMARY: 63 y.o. 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  Taken to IR for endovascular repair / embolization Kathyrn Sheriff), returned to ICU on vent, PCCM consulted for medical management.  Montey Hora, [place] Pgr: [phone]  or [phone] 09/11/2014, 6:11 PM  I have personally obtained a history, examined the patient, evaluated laboratory and imaging results, formulated the assessment and plan and placed orders.  CRITICAL CARE: The patient is critically ill with multiple organ systems failure and requires high complexity decision making for assessment and support, frequent evaluation and titration of therapies, application of advanced monitoring technologies and extensive interpretation of multiple databases. Critical Care Time devoted to patient care services described in this note is 45 minutes.   Rush Farmer, M.D. [place] [facility]. Pager: [phone]. After hours pager: [phone].

## 2014-09-11 NOTE — Consult Note (Addendum)
NEURO HOSPITALIST CONSULT NOTE    Reason for Consult: seizure  HPI:                                                                                                                                          Mindy Mills is an 63 y.o. female who was brought to the hospital after witnessed seizure followed by AMS.  On arrival she patient was unresponsive, not moving right side.  Initial CT head was obtained which showed a large diffuse subarachnoid hemorrhage is present in the sylvian fissures and in the basilar cisterns. There is clot in the suprasellar cistern extending into the interhemispheric fissure. The largest area of clot is in the region of the anterior communicating artery CTA 2.5 x 4 mm right ACA anurysm Upon returning to ED room she was more responsive, moving bilateral UE and LE, able to state her name but still not following commands. Pateint has shown no further seizure activity while in ED.  Currently receiving 1 gram of Keppra.   Past medical history unable to obtain.  No past surgical history on file.   Family History: Unable to obtain  Social History:  has no tobacco, alcohol, and drug history on file.  Allergies not on file  MEDICATIONS:                                                                                                                     Current Facility-Administered Medications  Medication Dose Route Frequency Provider Last Rate Last Dose  . levETIRAcetam (KEPPRA) IVPB 1000 mg/100 mL premix  1,000 mg Intravenous STAT Marliss Coots, PA-C      . levETIRAcetam (KEPPRA) IVPB 500 mg/100 mL premix  500 mg Intravenous Q12H Marliss Coots, PA-C       No current outpatient prescriptions on file.      ROS:  unobtainable from patient due to mental status  History obtained from unobtainable from patient due to mental status   Blood pressure  159/94, pulse 109, resp. rate 20, SpO2 96.00%.   Neurologic Examination:                                                                                                      General: NAD Mental Status: Alert, not oriented but able to state her name. Does not follow verbal commands and having significant difficulty following visual commands.  Minimal speech but states her name clearly.   Cranial Nerves: II: Discs flat bilaterally; Visual fields grossly normal, pupils equal, round, reactive to light and accommodation III,IV, VI: ptosis not present, eyes do not cross midline to the right.  Has a right gaze preference.  V,VII: face symmetric, facial light touch sensation normal bilaterally VIII: hearing normal bilaterally IX,X: gag reflex present XI: bilateral shoulder shrug XII: midline tongue extension without atrophy or fasciculations Motor: Moving all extremities antigravity with good strength Tone and bulk:normal tone throughout; no atrophy noted Sensory: withdraws from pain in all extremities Deep Tendon Reflexes:  Right: Upper Extremity   Left: Upper extremity   biceps (C-5 to C-6) 2/4   biceps (C-5 to C-6) 2/4 tricep (C7) 2/4    triceps (C7) 2/4 Brachioradialis (C6) 2/4  Brachioradialis (C6) 2/4  Lower Extremity Lower Extremity  quadriceps (L-2 to L-4) 3/4   quadriceps (L-2 to L-4) 3/4 Achilles (S1) 3/4   Achilles (S1) 3/4  Plantars: Up going bilaterally Cerebellar: Unable to obtain Gait: unable to obtain CV: pulses palpable throughout    Lab Results: Basic Metabolic Panel: No results found for this basename: NA, K, CL, CO2, GLUCOSE, BUN, CREATININE, CALCIUM, MG, PHOS,  in the last 168 hours  Liver Function Tests: No results found for this basename: AST, ALT, ALKPHOS, BILITOT, PROT, ALBUMIN,  in the last 168 hours No results found for this basename: LIPASE, AMYLASE,  in the last 168 hours No results found for this basename: AMMONIA,  in the last 168 hours  CBC: No  results found for this basename: WBC, NEUTROABS, HGB, HCT, MCV, PLT,  in the last 168 hours  Cardiac Enzymes: No results found for this basename: CKTOTAL, CKMB, CKMBINDEX, TROPONINI,  in the last 168 hours  Lipid Panel: No results found for this basename: CHOL, TRIG, HDL, CHOLHDL, VLDL, LDLCALC,  in the last 168 hours  CBG: No results found for this basename: GLUCAP,  in the last 168 hours  Microbiology: No results found for this or any previous visit.  Coagulation Studies: No results found for this basename: LABPROT, INR,  in the last 72 hours  Imaging: No results found.     Assessment and plan per attending neurologist  Etta Quill PA-C [facility] [phone]  09/11/2014, 1:04 PM   Assessment/Plan: 63 YO female presenting to ED after witnessed seizure and AMS.  Initial CT scan performed shows a large SAH with follow up  CTA of head shows a large right ACA aneurysm.  Neurosurgery has been consulted for further aneurysm management.  Neurology consulted for seizure. Exam shows no  further seizure activity, improving mentation but still unable to follow commands. Moving all extremities with a right sided neglect.  Recommend: 1) Keppra 500 mg BID IV. 2) Cerebral angiogram with neurosurgery to follow (ED has consulted) 3) BP control. 4) Please, call neurology if further issues with seizure management needed.   Patient seen and examined together with physician assistant and I concur with the assessment and plan.  Dorian Pod, MD

## 2014-09-11 NOTE — Transfer of Care (Signed)
Immediate Anesthesia Transfer of Care Note  Patient: Mindy Mills  Procedure(s) Performed: Procedure(s): Sub Arachnoid Aneurysm Coiling -RADIOLOGY WITH ANESTHESIA  (N/A)  Patient Location: PACU and NICU  Anesthesia Type:General  Level of Consciousness: sedated and Patient remains intubated per anesthesia plan  Airway & Oxygen Therapy: Patient remains intubated per anesthesia plan and Patient placed on Ventilator (see vital sign flow sheet for setting)  Post-op Assessment: Post -op Vital signs reviewed and stable  Post vital signs: Reviewed and stable  Complications: No apparent anesthesia complications

## 2014-09-12 ENCOUNTER — Inpatient Hospital Stay (HOSPITAL_COMMUNITY): Payer: Medicare Other

## 2014-09-12 ENCOUNTER — Encounter (HOSPITAL_COMMUNITY): Payer: Self-pay | Admitting: Radiology

## 2014-09-12 DIAGNOSIS — I517 Cardiomegaly: Secondary | ICD-10-CM

## 2014-09-12 LAB — POCT I-STAT 3, ART BLOOD GAS (G3+)
Acid-base deficit: 3 mmol/L — ABNORMAL HIGH (ref 0.0–2.0)
BICARBONATE: 20.9 meq/L (ref 20.0–24.0)
O2 Saturation: 85 %
PCO2 ART: 32.6 mmHg — AB (ref 35.0–45.0)
PH ART: 7.415 (ref 7.350–7.450)
PO2 ART: 49 mmHg — AB (ref 80.0–100.0)
Patient temperature: 98.6
TCO2: 22 mmol/L (ref 0–100)

## 2014-09-12 LAB — BLOOD GAS, ARTERIAL
Acid-base deficit: 6 mmol/L — ABNORMAL HIGH (ref 0.0–2.0)
BICARBONATE: 19 meq/L — AB (ref 20.0–24.0)
Drawn by: 36496
FIO2: 0.6 %
LHR: 16 {breaths}/min
O2 SAT: 96.5 %
PATIENT TEMPERATURE: 100.1
PEEP/CPAP: 5 cmH2O
TCO2: 20.2 mmol/L (ref 0–100)
VT: 420 mL
pCO2 arterial: 40.1 mmHg (ref 35.0–45.0)
pH, Arterial: 7.303 — ABNORMAL LOW (ref 7.350–7.450)
pO2, Arterial: 96.8 mmHg (ref 80.0–100.0)

## 2014-09-12 LAB — CBC
HEMATOCRIT: 36.1 % (ref 36.0–46.0)
Hemoglobin: 12.1 g/dL (ref 12.0–15.0)
MCH: 30.5 pg (ref 26.0–34.0)
MCHC: 33.5 g/dL (ref 30.0–36.0)
MCV: 90.9 fL (ref 78.0–100.0)
Platelets: 218 10*3/uL (ref 150–400)
RBC: 3.97 MIL/uL (ref 3.87–5.11)
RDW: 14.1 % (ref 11.5–15.5)
WBC: 21.3 10*3/uL — AB (ref 4.0–10.5)

## 2014-09-12 LAB — BASIC METABOLIC PANEL
Anion gap: 15 (ref 5–15)
BUN: 10 mg/dL (ref 6–23)
CO2: 19 meq/L (ref 19–32)
CREATININE: 0.56 mg/dL (ref 0.50–1.10)
Calcium: 7.5 mg/dL — ABNORMAL LOW (ref 8.4–10.5)
Chloride: 110 mEq/L (ref 96–112)
GFR calc Af Amer: >90 mL/min (ref >90)
GLUCOSE: 162 mg/dL — AB (ref 70–99)
Potassium: 4.5 mEq/L (ref 3.7–5.3)
Sodium: 144 mEq/L (ref 137–147)

## 2014-09-12 LAB — PHOSPHORUS: Phosphorus: 2.2 mg/dL — ABNORMAL LOW (ref 2.3–4.6)

## 2014-09-12 LAB — TROPONIN I
TROPONIN I: 5.4 ng/mL — AB (ref <0.30)
Troponin I: 4.43 ng/mL (ref <0.30)

## 2014-09-12 LAB — MAGNESIUM: MAGNESIUM: 1.6 mg/dL (ref 1.5–2.5)

## 2014-09-12 MED ORDER — FENTANYL CITRATE 0.05 MG/ML IJ SOLN
50.0000 ug | INTRAMUSCULAR | Status: DC | PRN
  Administered 2014-09-13 (×2): 50 ug via INTRAVENOUS
  Filled 2014-09-12 (×2): qty 2

## 2014-09-12 MED ORDER — CHLORHEXIDINE GLUCONATE 0.12 % MT SOLN
15.0000 mL | Freq: Two times a day (BID) | OROMUCOSAL | Status: DC
  Administered 2014-09-12 – 2014-09-24 (×25): 15 mL via OROMUCOSAL
  Filled 2014-09-12 (×24): qty 15

## 2014-09-12 MED ORDER — MORPHINE SULFATE 2 MG/ML IJ SOLN
1.0000 mg | INTRAMUSCULAR | Status: DC | PRN
  Administered 2014-09-12 – 2014-09-13 (×5): 2 mg via INTRAVENOUS
  Administered 2014-09-13: 1 mg via INTRAVENOUS
  Administered 2014-09-21 – 2014-09-24 (×6): 2 mg via INTRAVENOUS
  Filled 2014-09-12 (×13): qty 1

## 2014-09-12 MED ORDER — BUDESONIDE 0.25 MG/2ML IN SUSP
0.5000 mg | Freq: Two times a day (BID) | RESPIRATORY_TRACT | Status: DC
  Administered 2014-09-12 – 2014-09-15 (×7): 0.5 mg via RESPIRATORY_TRACT
  Filled 2014-09-12 (×11): qty 4

## 2014-09-12 MED ORDER — CETYLPYRIDINIUM CHLORIDE 0.05 % MT LIQD
7.0000 mL | Freq: Four times a day (QID) | OROMUCOSAL | Status: DC
  Administered 2014-09-12 – 2014-09-24 (×49): 7 mL via OROMUCOSAL

## 2014-09-12 MED ORDER — FUROSEMIDE 10 MG/ML IJ SOLN
40.0000 mg | Freq: Once | INTRAMUSCULAR | Status: AC
  Administered 2014-09-12: 40 mg via INTRAVENOUS
  Filled 2014-09-12: qty 4

## 2014-09-12 NOTE — Progress Notes (Signed)
Pt self extubated-RT placed pt on 6L [place] sats 90. RN aware

## 2014-09-12 NOTE — Progress Notes (Signed)
Placed pt on NRB due to low sats

## 2014-09-12 NOTE — Progress Notes (Signed)
Echocardiogram 2D Echocardiogram has been performed.  Mindy Mills 09/12/2014, 9:47 AM

## 2014-09-12 NOTE — Progress Notes (Addendum)
eLink Physician-Brief Progress Note Patient Name: Mindy Mills DOB: 03/03/1951 MRN: [id number]   Date of Service  09/12/2014  HPI/Events of Note  Patient with agitation and confusion, review of home meds show both oxycodone and narco, possible opoid withdrawal  eICU Interventions  Continue with morphine PRN     Intervention Category Intermediate Interventions: Pain - evaluation and management  Harl Wiechmann 09/12/2014, 11:19 PM

## 2014-09-12 NOTE — Procedures (Signed)
Extubation Procedure Note  Patient Details:   Name: Mindy Mills DOB: [birth year] MRN: [id number]   Airway Documentation:     Evaluation  O2 sats: 90 Complications: Pt self extubated Patient self extubated. Bilateral Breath Sounds: Clear Suctioning: Oral RT called to pts bedside. Pt self extubated. RN aware. Pt removed glove restraint and self extubated. Pt can follow commands. Pt has no stridor. BBS clr. RR 26, sats 90 on 6L [name]. Pt in no distress. RN aware  Constance Holster 09/12/2014, 12:11 PM

## 2014-09-12 NOTE — Progress Notes (Signed)
PULMONARY / CRITICAL CARE MEDICINE   Name: Mindy Mills MRN: [id number] DOB: May 10, 1951    ADMISSION DATE:  09/11/2014 CONSULTATION DATE:  09/12/2014  REFERRING MD :  Kathyrn Sheriff  CHIEF COMPLAINT:  Seizure  INITIAL PRESENTATION: 63 y.o. F brought to [facility] ED on 9/15 after a witnessed syncopal episode followed by a grand mal seizure while in the car.  Upon ED arrival, pt was unresponsive and not moving her right side.  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  She was intubated and underwent endovascular procedure by Dr. Leighton Ruff.  PCCM was consulted. 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  SIGNIFICANT EVENTS: 9/15 - admitted with [facility] and right ACA aneurysm, underwent endovascular repair, returned to ICU on vent. 9/15 - A com aneurysm coiled   SUBJECTIVE: Awake off propofol gtt Low grade fever Good UO  VITAL SIGNS: Temp:  [98.1 F (36.7 C)-100.1 F (37.8 C)] 99.3 F (37.4 C) (09/16 0800) Pulse Rate:  [82-121] 99 (09/16 0900) Resp:  [16-34] 19 (09/16 0900) BP: (96-159)/(57-99) 126/79 mmHg (09/16 0900) SpO2:  [91 %-100 %] 94 % (09/16 0900) Arterial Line BP: (103-145)/(63-99) 117/69 mmHg (09/16 0900) FiO2 (%):  [40 %-60 %] 40 % (09/16 0842) Weight:  [66.7 kg (147 lb 0.8 oz)-68.5 kg (151 lb 0.2 oz)] 68.5 kg (151 lb 0.2 oz) (09/16 0322) HEMODYNAMICS:   VENTILATOR SETTINGS: Vent Mode:  [-] PRVC FiO2 (%):  [40 %-60 %] 40 % Set Rate:  [16 bmp] 16 bmp Vt Set:  [420 mL-500 mL] 420 mL PEEP:  [5 cmH20] 5 cmH20 Plateau Pressure:  [13 cmH20-15 cmH20] 13 cmH20 INTAKE / OUTPUT: Intake/Output     09/15 0701 - 09/16 0700 09/16 0701 - 09/17 0700   I.V. (mL/kg) 3141.4 (45.9) 282 (4.1)   IV Piggyback 350    Total Intake(mL/kg) 3491.4 (51) 282 (4.1)   Urine (mL/kg/hr) 770    Total Output 770     Net +2721.4 +282          PHYSICAL EXAMINATION: General: Chronically ill  appearing elderly female,  intubated, in NAD. Neuro: sedated on propofol, non-focal, wakes up RASS 0 HEENT: [place]/AT. PERRL, sclerae anicteric. Cardiovascular: RRR, no M/R/G.  Lungs: Respirations even and unlabored.  decreased BS BL Abdomen: BS x 4, soft, NT/ND.  Musculoskeletal: No gross deformities, no edema.  Skin: Intact, warm, no rashes.  LABS:  CBC  Recent Labs Lab 09/11/14 1253 09/11/14 1304  09/11/14 1720 09/11/14 1729 09/11/14 2030  WBC  --  23.4*  --   --   --  17.8*  HGB 16.3* 14.3  < > 13.3 13.3 13.3  HCT 48.0* 43.3  < > 39.0 39.0 39.8  PLT  --  315  --   --   --  301  < > = values in this interval not displayed. Coag's  Recent Labs Lab 09/11/14 2030  APTT 26  INR 1.04   BMET  Recent Labs Lab 09/11/14 1253 09/11/14 1304 09/11/14 1325 09/11/14 1720 09/11/14 1729 09/12/14 0450  NA 135* 139 140 141 141 144  K 7.5* 3.1* 3.0* 2.8* 2.9* 4.5  CL 105 102 104  --   --  110  CO2  --  21  --   --   --  19  BUN 20 13 13   --   --  10  CREATININE 0.80 0.62 0.70  --   --  0.56  GLUCOSE 258* 249* 249*  --  197* 162*   Electrolytes  Recent Labs Lab 09/11/14 1304 09/12/14 0450  CALCIUM 8.5 7.5*  MG  --  1.6  PHOS  --  2.2*   Sepsis Markers No results found for this basename: LATICACIDVEN, PROCALCITON, O2SATVEN,  in the last 168 hours  ABG  Recent Labs Lab 09/11/14 1720 09/11/14 2040 09/12/14 0459  PHART 7.248* 7.211* 7.303*  PCO2ART 50.0* 47.5* 40.1  PO2ART 144.0* 87.0 96.8    Liver Enzymes  Recent Labs Lab 09/11/14 1304  AST 16  ALT 10  ALKPHOS 77  BILITOT 0.3  ALBUMIN 3.5   Cardiac Enzymes  Recent Labs Lab 09/11/14 2030 09/12/14 0215 09/12/14 0455  TROPONINI 5.10* 5.40* 4.43*  PROBNP 3137.0*  --   --     Glucose No results found for this basename: GLUCAP,  in the last 168 hours  Imaging Ct Angio Head W/cm &/or Wo Cm  09/11/2014   CLINICAL DATA:  Code stroke.  Left-sided deficit.  EXAM: CT ANGIOGRAPHY HEAD  TECHNIQUE:  Multidetector CT imaging of the head was performed using the standard protocol during bolus administration of intravenous contrast. Multiplanar CT image reconstructions and MIPs were obtained to evaluate the vascular anatomy.  CONTRAST:  50 mL MultiHance IV  COMPARISON:  None.  FINDINGS: Large subarachnoid hemorrhage. Diffuse bilateral subarachnoid hemorrhage is present in the sylvian fissures and in the basilar cisterns. There is clot in the suprasellar cistern extending into the interhemispheric fissure. The largest area of clot is in the region of the anterior communicating artery.  Ventricles are not enlarged. No ventricular hemorrhage. There is calcified choroid in the fourth ventricle.  Negative for acute ischemic infarct. No shift of the midline structures.  Patient is intubated.  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 This has an irregular morphology measures 2.5 x 4 mm. The aneurysm projects inferiorly and is the source of subarachnoid hemorrhage. No other aneurysm  No significant vasospasm.  Both vertebral arteries are patent to the basilar. PICA patent bilaterally. Basilar widely patent. Superior cerebellar arteries are widely patent  Internal carotid artery patent bilaterally. Mild atherosclerotic disease in the cavernous carotid bilaterally. Anterior and middle cerebral arteries are patent bilaterally.  Review of the MIP images confirms the above findings.  IMPRESSION: Large subarachnoid hemorrhage. 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 No significant vasospasm.  3D imaging was generated and saved to PACS.  Ventricle size is normal.  No acute ischemic infarct.  Critical Value/emergent results were called by telephone at the time of interpretation on 09/11/2014 at 1:16 pm to Dr. Davonna Belling , who verbally acknowledged these results.   Electronically Signed   By: Franchot Gallo M.D.   On: 09/11/2014 13:24    Ct Head Wo Contrast  09/12/2014   CLINICAL DATA:  63 year old female with subarachnoid hemorrhage post coiling of anterior communicating artery aneurysm.  EXAM: CT HEAD WITHOUT CONTRAST  TECHNIQUE: Contiguous axial images were obtained from the base of the skull through the vertex without intravenous contrast.  COMPARISON:  09/11/2014.  FINDINGS: Diffuse subarachnoid hemorrhage with clot seen adjacent to the coiled anterior communicating artery aneurysm.  Intraventricular blood now noted. The ventricular size may be minimally more prominent than on the prior examination and close continued surveillance recommended to help determine if the patient develops hydrocephalus.  No large thrombotic infarct currently detected.  No intracranial mass lesion noted on this  unenhanced exam.  IMPRESSION: Diffuse subarachnoid hemorrhage with clot seen adjacent to the coiled anterior communicating artery aneurysm.  Intraventricular blood now noted. The ventricular size may be minimally more prominent than on the prior examination and close continued surveillance recommended to help determine if the patient develops hydrocephalus.   Electronically Signed   By: Chauncey Cruel M.D.   On: 09/12/2014 06:24   Dg Chest Port 1 View  09/12/2014   CLINICAL DATA:  Evaluate endotracheal tube position  EXAM: PORTABLE CHEST - 1 VIEW  COMPARISON:  Portable chest x-ray of 09/11/2014  FINDINGS: There is little change in probable pulmonary vascular congestion with effusions. The heart is mildly enlarged and stable. The tip of the endotracheal tube is approximately 5.4 cm above the carina. NG tube remains.  IMPRESSION: 1. Moderate pulmonary vascular congestion with effusions. 2. Tip of endotracheal tube approximately 5.4 cm above the carina.   Electronically Signed   By: Ivar Drape M.D.   On: 09/12/2014 08:19   Dg Chest Port 1 View  09/11/2014   CLINICAL DATA:  Check endotracheal tube position.  EXAM: PORTABLE CHEST - 1 VIEW  COMPARISON:   09/11/2014 at 1332 hr.  FINDINGS: Her radiographs at 1939 hr.  Endotracheal tube is in satisfactory position. The distal tip is at the level of the clavicular heads, and 6.5 cm above the carina. Nasogastric tube courses into the stomach and below the edge of the image. Pulmonary vascular congestion and mild pulmonary edema pattern is unchanged. Underlying emphysematous changes suspected. Negative for pneumothorax. No visible pleural effusion.  IMPRESSION: Satisfactory position of endotracheal tube. No significant change in mild pulmonary edema and vascular congestion. Suspect underlying emphysema.   Electronically Signed   By: Curlene Dolphin M.D.   On: 09/11/2014 20:07   Dg Chest Port 1 View  09/11/2014   CLINICAL DATA:  Altered mental status.  EXAM: PORTABLE CHEST - 1 VIEW  COMPARISON:  None.  FINDINGS: The patient is slightly rotated to the right. Cardiac silhouette is within normal limits for size. Mild thoracic aortic calcification is noted. Multiple monitoring leads overlie the chest, predominantly on the right. There is pulmonary vascular congestion with interstitial densities bilaterally, greatest in the lung bases. No pleural effusion is identified, although the left lateral costophrenic angle is incompletely imaged. No pneumothorax is seen. No acute osseous abnormality is identified.  IMPRESSION: Pulmonary vascular congestion and bilateral lung opacities compatible with mild pulmonary edema.   Electronically Signed   By: Logan Bores   On: 09/11/2014 13:43   Dg Abd Portable 1v  09/11/2014   CLINICAL DATA:  Orogastric tube placement  EXAM: PORTABLE ABDOMEN - 1 VIEW  COMPARISON:  None.  FINDINGS: The tip of the gastric tube is in the fundus of the stomach. Iodinated contrast is present in the collecting system of the kidneys and bladder. No disproportionate dilatation of bowel. Stomach is decompressed.  IMPRESSION: NG tube tip is in the fundus of the stomach. Stomach is decompressed.   Electronically  Signed   By: Maryclare Bean M.D.   On: 09/11/2014 20:24   ASSESSMENT / PLAN:  PULMONARY OETT 9/15 >>> A: VDRF - in setting of acute SAH secondary to right ACA aneurysm Likely COPD, compensated hypercarbia P:   VAP bundle. SBT in AM - extubate when awake DuoNebs / Albuterol.   CARDIOVASCULAR A:  Hx HTN Incomplete LBBB on EKG, nstemi Acute Pulmonary edema on CXR , high BNP P:  BP management per neuro / neurosurgery, target SBP  of 160 mmHg. Fu echo   RENAL A:   Hypokalemia Mild metabolic acidosis P:   NS @ 50, mainly for contrast use but patient already has pulmonary edema on CXR so will use IVF gingerly. Lasix x1 Replace electrolytes as indicated.  GASTROINTESTINAL A:   Nutrition GI prophylaxis Hx HCV P:   NPO. SUP: Pantoprazole.  HEMATOLOGIC A:   VTE Prophylaxis P:  SCD's only. CBC in AM.  INFECTIOUS A:   Leukocytosis - favor stress response / acute phase reactant as no clear indication of infectious etiology. P:   Monitor clinically.  ENDOCRINE A:   Hyperglycemia  P:   CBG's q4hr. SSI.  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 Daily WUA. Keppra per neuro recs.  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  CRITICAL CARE: The patient is critically ill with multiple organ systems failure and requires high complexity decision making for assessment and support, frequent evaluation and titration of therapies, application of advanced monitoring technologies and extensive interpretation of multiple  databases. Critical Care Time devoted to patient care services described in this note is 40 minutes.   Kara Mead MD. Shade Flood. [facility] Pager [phone] If no response call [phone]

## 2014-09-12 NOTE — Progress Notes (Signed)
Inpatient Diabetes Program Recommendations  AACE/ADA: New Consensus Statement on Inpatient Glycemic Control (2013)  Target Ranges:  Prepandial:   less than 140 mg/dL      Peak postprandial:   less than 180 mg/dL (1-2 hours)      Critically ill patients:  140 - 180 mg/dL     Results for CLEDA, IMEL (MRN [id number]) as of 09/12/2014 08:08  Ref. Range 09/11/2014 12:53 09/11/2014 13:04 09/11/2014 13:25 09/11/2014 17:29 09/12/2014 04:50  Glucose Latest Range: 70-99 mg/dL 258 (H) 249 (H) 249 (H) 197 (H) 162 (H)    Patient admitted with Subarachnoid Hemorrhage with Aneurysm.  No History of DM noted.  Patient with elevated glucose levels by BMETs.  No insulin ordered at present.    MD- Please place order for CBG checks Q4 hours and Start Novolog Sensitive SSI Q4 hours if CBGs are elevated May want to consider ICU Glycemic Control Protocol if condition worsens   Will follow Mindy Mills Lowella Dell RN, MSN, CDE Diabetes Coordinator Inpatient Diabetes Program Team Pager: [phone] (8a-10p)

## 2014-09-12 NOTE — Progress Notes (Signed)
SLP Cancellation Note  Patient Details Name: Mindy Mills MRN: [id number] DOB: Aug 26, 1951   Cancelled treatment:       Reason Eval/Treat Not Completed: Patient not medically ready. Orders received for bedside swallow evaluation, however patient remains on the ventilator at this time. Will continue to follow to determine readiness. Discussed with RN.    Germain Osgood, M.A. CCC-SLP [phone]  Germain Osgood 09/12/2014, 9:10 AM

## 2014-09-12 NOTE — Progress Notes (Addendum)
Spoke with Dr. Kathyrn Sheriff about pt's status concerning her Nimotop.  Waiting on SLP to see pt.  If she does not pass,  orders to put in NG tube.  OK'd fluids at 50cc/hr.  Will continue to monitor pt.

## 2014-09-12 NOTE — Progress Notes (Signed)
Per dr Elsworth Soho wean to extubate

## 2014-09-12 NOTE — Evaluation (Signed)
Clinical/Bedside Swallow Evaluation Patient Details  Name: Mindy Mills MRN: [id number] Date of Birth: March 22, 1951  Today's Date: 09/12/2014 Time: 2426-8341 SLP Time Calculation (min): 24 min  Past Medical History:  Past Medical History  Diagnosis Date  . Hypertension   . Migraine   . Cancer     Cervicle  . Hepatitis C    Past Surgical History:  Past Surgical History  Procedure Laterality Date  . Abdominal hysterectomy     HPI:  63 y.o. F brought to [facility] ED on 9/15 after a witnessed syncopal episode followed by a grand mal seizure while in the car. Upon ED arrival, pt was unresponsive and not moving her right side. 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 She was intubated and underwent endovascular procedure by Dr. Leighton Ruff.   Assessment / Plan / Recommendation Clinical Impression  Pt was lethargic, however was easily aroused and agreeable to PO trials. SLP provided Mod multimodal cueing and stimulation to maintain alert state throughout intake. Pt exhibited consistent throat clearing with ice chips and thin liquids, which was not observed with nectar thick liquids or solids. Pt did not have her dentures present, and therefore did not want to try solids more advanced than Dys 2. Recommend to initiate Dys 2 textures and nectar thick liquids, to be consumed when maximally alert.    Aspiration Risk  Moderate    Diet Recommendation Dysphagia 2 (Fine chop);Nectar-thick liquid   Liquid Administration via: Cup;No straw Medication Administration: Whole meds with puree Supervision: Patient able to self feed;Full supervision/cueing for compensatory strategies Compensations: Slow rate;Small sips/bites Postural Changes and/or Swallow Maneuvers: Seated upright 90 degrees;Upright 30-60 min after meal    Other  Recommendations Oral Care Recommendations: Oral care BID Other Recommendations: Order thickener from pharmacy;Prohibited food (jello, ice cream, thin soups);Remove water  pitcher   Follow Up Recommendations  None (none anticipated)    Frequency and Duration min 2x/week  2 weeks   Pertinent Vitals/Pain n/a    SLP Swallow Goals     Swallow Study Prior Functional Status       General Date of Onset: 09/11/14 HPI: 63 y.o. F brought to [facility] ED on 9/15 after a witnessed syncopal episode followed by a grand mal seizure while in the car. Upon ED arrival, pt was unresponsive and not moving her right side. 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 She was intubated and underwent endovascular procedure by Dr. Leighton Ruff. Type of Study: Bedside swallow evaluation Previous Swallow Assessment: none in chart Diet Prior to this Study: NPO Temperature Spikes Noted: Yes Respiratory Status: Nasal cannula History of Recent Intubation: Yes Length of Intubations (days): 1 days Date extubated: 09/12/14 (self-extubated) Behavior/Cognition: Lethargic;Cooperative;Pleasant mood Oral Cavity - Dentition: Dentures, not available;Edentulous Self-Feeding Abilities: Able to feed self;Needs assist Patient Positioning: Upright in bed Baseline Vocal Quality: Low vocal intensity;Other (comment) (glottal fry) Volitional Cough: Strong Volitional Swallow: Able to elicit    Oral/Motor/Sensory Function Overall Oral Motor/Sensory Function: Impaired (mild, generalized weakness)   Ice Chips Ice chips: Impaired Presentation: Spoon Pharyngeal Phase Impairments: Suspected delayed Swallow;Throat Clearing - Immediate   Thin Liquid Thin Liquid: Impaired Presentation: Cup;Self Fed;Straw Pharyngeal  Phase Impairments: Suspected delayed Swallow;Throat Clearing - Immediate;Cough - Delayed    Nectar Thick Nectar Thick Liquid: Impaired Presentation: Cup;Self Fed Pharyngeal Phase Impairments: Suspected delayed Swallow   Honey Thick Honey Thick Liquid: Not tested   Puree Puree: Impaired Presentation: Self Fed;Spoon Oral Phase Functional Implications: Prolonged oral transit Pharyngeal  Phase Impairments: Suspected  delayed Swallow   Solid   GO    Solid: Impaired Presentation: Self Fed;Spoon Oral Phase Impairments: Impaired mastication (dentures not available) Pharyngeal Phase Impairments: Suspected delayed Swallow      Germain Osgood, M.A. CCC-SLP [phone]  Germain Osgood 09/12/2014,4:45 PM

## 2014-09-12 NOTE — Progress Notes (Signed)
Patient ID: Mindy Mills, female   DOB: 17-May-1951, 63 y.o.   MRN: [id number] Extubated, awake, follows commands. Somnolent but combative when stimulated.

## 2014-09-12 NOTE — Progress Notes (Signed)
Pt seen and examined. No issues overnight, she remained intubated on low-dose propofol drip.  EXAM: Temp:  [98.1 F (36.7 C)-100.1 F (37.8 C)] 99.3 F (37.4 C) (09/16 0800) Pulse Rate:  [82-121] 88 (09/16 0842) Resp:  [16-34] 20 (09/16 0700) BP: (96-159)/(57-99) 122/73 mmHg (09/16 0842) SpO2:  [91 %-100 %] 97 % (09/16 0842) Arterial Line BP: (103-145)/(63-99) 103/63 mmHg (09/16 0700) FiO2 (%):  [40 %-60 %] 40 % (09/16 0842) Weight:  [66.7 kg (147 lb 0.8 oz)-68.5 kg (151 lb 0.2 oz)] 68.5 kg (151 lb 0.2 oz) (09/16 0322) Intake/Output     09/15 0701 - 09/16 0700 09/16 0701 - 09/17 0700   I.V. (mL/kg) 3141.4 (45.9)    IV Piggyback 350    Total Intake(mL/kg) 3491.4 (51)    Urine (mL/kg/hr) 770    Total Output 770     Net +2721.4           On propofol : Opens eyes to voice Nods to questions Tracks to both sides Follows commands BUE/BLE Right groin site soft  LABS: Lab Results  Component Value Date   CREATININE 0.56 09/12/2014   BUN 10 09/12/2014   NA 144 09/12/2014   K 4.5 09/12/2014   CL 110 09/12/2014   CO2 19 09/12/2014   Lab Results  Component Value Date   WBC 17.8* 09/11/2014   HGB 13.3 09/11/2014   HCT 39.8 09/11/2014   MCV 90.5 09/11/2014   PLT 301 09/11/2014    IMAGING: CTH reviewed demonstrating slight layering of blood in the occipital horns. Essentially stable size of ventricles. Diffuse SAH still present.  IMPRESSION: - 63 y.o. female SAHd#2 s/p Acom coiling, neurologically stable  PLAN: - Cont vent mgmt per PCCM. With essentially stable CT and good neurologic exam, I believe we could attempt extubation if pulmonary mechanics are ok. - Spasm prophylaxis: Nimotop/Zocor, MWF TCD - Troponin bump likely sec to [facility]. Vitals remain relatively stable. Would not start anti-platelet or anticoagulation regardless. May consider cardiac echo.

## 2014-09-12 NOTE — Progress Notes (Signed)
VASCULAR LAB PRELIMINARY  PRELIMINARY  PRELIMINARY  PRELIMINARY  Transcranial Doppler  Date POD PCO2 HCT BP  MCA ACA PCA OPHT SIPH VERT Basilar  9-16- 15 SB     Right  Left   42  27   -35  -31   28  24   15  13    36  45   -18  --     -31         Right  Left                                            Right  Left                                             Right  Left                                             Right  Left                                            Right  Left                                            Right  Left                                        MCA = Middle Cerebral Artery      OPHT = Opthalmic Artery     BASILAR = Basilar Artery   ACA = Anterior Cerebral Artery     SIPH = Carotid Siphon PCA = Posterior Cerebral Artery   VERT = Verterbral Artery                   Normal MCA = 62+\-12 ACA = 50+\-12 PCA = 42+\-23         Lavaris Sexson, RVT 09/12/2014, 2:38 PM

## 2014-09-12 NOTE — Progress Notes (Signed)
UR completed.  Trevione Wert, RN BSN MHA CCM Trauma/Neuro ICU Case Manager [phone]  

## 2014-09-13 ENCOUNTER — Inpatient Hospital Stay (HOSPITAL_COMMUNITY): Payer: Medicare Other

## 2014-09-13 ENCOUNTER — Encounter (HOSPITAL_COMMUNITY): Payer: Self-pay | Admitting: Neurosurgery

## 2014-09-13 LAB — BASIC METABOLIC PANEL
Anion gap: 16 — ABNORMAL HIGH (ref 5–15)
BUN: 11 mg/dL (ref 6–23)
CHLORIDE: 105 meq/L (ref 96–112)
CO2: 21 meq/L (ref 19–32)
Calcium: 8.5 mg/dL (ref 8.4–10.5)
Creatinine, Ser: 0.45 mg/dL — ABNORMAL LOW (ref 0.50–1.10)
GFR calc Af Amer: >90 mL/min (ref >90)
GFR calc non Af Amer: >90 mL/min (ref >90)
GLUCOSE: 139 mg/dL — AB (ref 70–99)
POTASSIUM: 3.2 meq/L — AB (ref 3.7–5.3)
Sodium: 142 mEq/L (ref 137–147)

## 2014-09-13 LAB — POCT I-STAT, CHEM 8
BUN: 20 mg/dL (ref 6–23)
CREATININE: 0.8 mg/dL (ref 0.50–1.10)
Calcium, Ion: 0.96 mmol/L — ABNORMAL LOW (ref 1.13–1.30)
Chloride: 105 mEq/L (ref 96–112)
Glucose, Bld: 258 mg/dL — ABNORMAL HIGH (ref 70–99)
HCT: 48 % — ABNORMAL HIGH (ref 36.0–46.0)
Hemoglobin: 16.3 g/dL — ABNORMAL HIGH (ref 12.0–15.0)
POTASSIUM: 7.5 meq/L — AB (ref 3.7–5.3)
Sodium: 135 mEq/L — ABNORMAL LOW (ref 137–147)
TCO2: 24 mmol/L (ref 0–100)

## 2014-09-13 LAB — POCT I-STAT 3, ART BLOOD GAS (G3+)
ACID-BASE EXCESS: 3 mmol/L — AB (ref 0.0–2.0)
Bicarbonate: 26.9 mEq/L — ABNORMAL HIGH (ref 20.0–24.0)
O2 SAT: 93 %
PCO2 ART: 37.2 mmHg (ref 35.0–45.0)
TCO2: 28 mmol/L (ref 0–100)
pH, Arterial: 7.467 — ABNORMAL HIGH (ref 7.350–7.450)
pO2, Arterial: 64 mmHg — ABNORMAL LOW (ref 80.0–100.0)

## 2014-09-13 LAB — TROPONIN I: TROPONIN I: 1.62 ng/mL — AB (ref <0.30)

## 2014-09-13 MED ORDER — VITAL AF 1.2 CAL PO LIQD
1000.0000 mL | ORAL | Status: DC
  Filled 2014-09-13 (×3): qty 1000

## 2014-09-13 MED ORDER — FENTANYL CITRATE 0.05 MG/ML IJ SOLN: 50.0000 ug | Freq: Once | INTRAMUSCULAR | Status: DC

## 2014-09-13 MED ORDER — ETOMIDATE 2 MG/ML IV SOLN
0.3000 mg/kg | Freq: Once | INTRAVENOUS | Status: AC
  Administered 2014-09-13: 20 mg via INTRAVENOUS

## 2014-09-13 MED ORDER — FUROSEMIDE 10 MG/ML IJ SOLN
40.0000 mg | Freq: Once | INTRAMUSCULAR | Status: AC
  Administered 2014-09-13: 40 mg via INTRAVENOUS

## 2014-09-13 MED ORDER — CEFTRIAXONE SODIUM 1 G IJ SOLR
1.0000 g | INTRAMUSCULAR | Status: DC
  Administered 2014-09-13 – 2014-09-16 (×4): 1 g via INTRAVENOUS
  Filled 2014-09-13 (×5): qty 10

## 2014-09-13 MED ORDER — SODIUM CHLORIDE 0.9 % IV SOLN
0.0000 ug/h | INTRAVENOUS | Status: DC
  Administered 2014-09-13: 50 ug/h via INTRAVENOUS
  Administered 2014-09-14: 150 ug/h via INTRAVENOUS
  Administered 2014-09-16: 50 ug/h via INTRAVENOUS
  Filled 2014-09-13 (×3): qty 50

## 2014-09-13 MED ORDER — FENTANYL CITRATE 0.05 MG/ML IJ SOLN
INTRAMUSCULAR | Status: AC
  Administered 2014-09-13: 100 ug
  Filled 2014-09-13: qty 2

## 2014-09-13 MED ORDER — POTASSIUM CHLORIDE 10 MEQ/50ML IV SOLN
INTRAVENOUS | Status: AC
  Administered 2014-09-13: 16:00:00
  Administered 2014-09-13: 10 meq
  Filled 2014-09-13: qty 100

## 2014-09-13 MED ORDER — METHYLPREDNISOLONE SODIUM SUCC 40 MG IJ SOLR
40.0000 mg | Freq: Three times a day (TID) | INTRAMUSCULAR | Status: DC
  Administered 2014-09-13 – 2014-09-16 (×9): 40 mg via INTRAVENOUS
  Filled 2014-09-13 (×12): qty 1

## 2014-09-13 MED ORDER — FENTANYL CITRATE 0.05 MG/ML IJ SOLN
INTRAMUSCULAR | Status: AC
  Filled 2014-09-13: qty 2

## 2014-09-13 MED ORDER — VITAL HIGH PROTEIN PO LIQD
1000.0000 mL | ORAL | Status: DC
  Filled 2014-09-13 (×3): qty 1000

## 2014-09-13 MED ORDER — DEXMEDETOMIDINE HCL IN NACL 200 MCG/50ML IV SOLN
0.0000 ug/kg/h | INTRAVENOUS | Status: AC
  Administered 2014-09-13: 0.7 ug/kg/h via INTRAVENOUS
  Administered 2014-09-13: 0.1 ug/kg/h via INTRAVENOUS
  Administered 2014-09-13: 0.6 ug/kg/h via INTRAVENOUS
  Administered 2014-09-14: 0.7 ug/kg/h via INTRAVENOUS
  Administered 2014-09-14: 0 ug/kg/h via INTRAVENOUS
  Administered 2014-09-14 – 2014-09-15 (×2): 0.3 ug/kg/h via INTRAVENOUS
  Administered 2014-09-15 – 2014-09-16 (×3): 0.4 ug/kg/h via INTRAVENOUS
  Filled 2014-09-13 (×10): qty 50

## 2014-09-13 MED ORDER — DOCUSATE SODIUM 50 MG/5ML PO LIQD
100.0000 mg | Freq: Two times a day (BID) | ORAL | Status: DC | PRN
  Filled 2014-09-13: qty 10

## 2014-09-13 MED ORDER — FENTANYL BOLUS VIA INFUSION
50.0000 ug | INTRAVENOUS | Status: DC | PRN
  Administered 2014-09-14 – 2014-09-15 (×2): 100 ug via INTRAVENOUS
  Filled 2014-09-13: qty 100

## 2014-09-13 MED ORDER — HALOPERIDOL LACTATE 5 MG/ML IJ SOLN
5.0000 mg | Freq: Once | INTRAMUSCULAR | Status: DC
  Filled 2014-09-13: qty 1

## 2014-09-13 MED ORDER — POTASSIUM CHLORIDE 10 MEQ/100ML IV SOLN: 10.0000 meq | INTRAVENOUS | Status: AC

## 2014-09-13 MED ORDER — POTASSIUM CHLORIDE CRYS ER 20 MEQ PO TBCR
40.0000 meq | EXTENDED_RELEASE_TABLET | Freq: Once | ORAL | Status: AC
  Administered 2014-09-13: 40 meq via ORAL
  Filled 2014-09-13: qty 2

## 2014-09-13 MED ORDER — FENTANYL CITRATE 0.05 MG/ML IJ SOLN
25.0000 ug | INTRAMUSCULAR | Status: DC | PRN
  Administered 2014-09-13 – 2014-09-16 (×2): 50 ug via INTRAVENOUS
  Filled 2014-09-13: qty 2

## 2014-09-13 MED ORDER — MIDAZOLAM HCL 2 MG/2ML IJ SOLN
INTRAMUSCULAR | Status: AC
  Administered 2014-09-13: 4 mg
  Filled 2014-09-13: qty 4

## 2014-09-13 MED ORDER — FUROSEMIDE 10 MG/ML IJ SOLN
INTRAMUSCULAR | Status: AC
  Administered 2014-09-13: 40 mg
  Filled 2014-09-13: qty 4

## 2014-09-13 NOTE — ED Provider Notes (Signed)
CSN: [id number]     Arrival date & time 09/11/14  1219 History   First MD Initiated Contact with Patient 09/11/14 1222     Chief Complaint  Patient presents with  . Seizures   LEVEL 5 caveat due to ams  (Consider location/radiation/quality/duration/timing/severity/associated sxs/prior Treatment) Patient is a 63 y.o. female presenting with seizures. The history is provided by the patient and the EMS personnel.  Seizures  patient brought in by EMS after being unresponsive and having a seizure. Reportedly had been coughing and then tensed up and had tonic-clonic activity. Initially unresponsive. Upon my arrival she was nonverbal but did have some purposeful movements of the left side. Minimal movements of the right side. Pupils are mostly shut. Very weak gag reflex. Patient needed assisted ventilations prehospital  Past Medical History  Diagnosis Date  . Hypertension   . Migraine   . Cancer     Cervicle  . Hepatitis C    Past Surgical History  Procedure Laterality Date  . Abdominal hysterectomy     No family history on file. History  Substance Use Topics  . Smoking status: Current Every Day Smoker -- 0.50 packs/day    Types: Cigarettes  . Smokeless tobacco: Not on file  . Alcohol Use: Yes     Comment: rarely   OB History   Grav Para Term Preterm Abortions TAB SAB Ect Mult Living                 Review of Systems  Unable to perform ROS Neurological: Positive for seizures.      Allergies  Bee venom  Home Medications   Prior to Admission medications   Medication Sig Start Date End Date Taking? Authorizing Provider  butorphanol (STADOL) 10 MG/ML nasal spray Place 1 spray into the nose every 4 (four) hours as needed for headache or migraine.   Yes Historical Provider, MD  Cholecalciferol 2000 UNITS CAPS Take 2,000 Units by mouth daily.   Yes Historical Provider, MD  EPINEPHrine (EPIPEN) 0.3 mg/0.3 mL IJ SOAJ injection Inject 0.3 mg into the muscle once as needed (for  allergic reaction).   Yes Historical Provider, MD  FLUoxetine (PROZAC) 20 MG capsule Take 80 mg by mouth daily. For depression / anxiety   Yes Historical Provider, MD  HYDROcodone-acetaminophen (NORCO) 10-325 MG per tablet Take 1-2 tablets by mouth every 4 (four) hours as needed for moderate pain.   Yes Historical Provider, MD  meloxicam (MOBIC) 15 MG tablet Take 15 mg by mouth daily as needed (for joint pain).   Yes Historical Provider, MD  nortriptyline (PAMELOR) 10 MG capsule Take 10 mg by mouth daily.   Yes Historical Provider, MD  oxyCODONE (ROXICODONE) 15 MG immediate release tablet Take 15 mg by mouth 3 (three) times daily as needed (for severe / breakthrough pain).   Yes Historical Provider, MD  promethazine (PHENERGAN) 25 MG tablet Take 50 mg by mouth 2 (two) times daily as needed for nausea or vomiting.   Yes Historical Provider, MD  traZODone (DESYREL) 50 MG tablet Take 25 mg by mouth at bedtime as needed (for sleep / anxiety).   Yes Historical Provider, MD  verapamil (CALAN-SR) 180 MG CR tablet Take 180 mg by mouth daily. For migraines   Yes Historical Provider, MD   BP 112/78  Pulse 87  Temp(Src) 98.7 F (37.1 C) (Oral)  Resp 20  Ht 5\' 3"  (1.6 m)  Wt 151 lb 0.2 oz (68.5 kg)  BMI 26.76 kg/m2  SpO2  89% Physical Exam  Constitutional: She appears well-developed.  HENT:  Head: Normocephalic.  Eyes:  Pupils somewhat sluggish  Neck: Neck supple.  Cardiovascular: Normal rate.   Pulmonary/Chest: Effort normal.  Transmitted upper airway sounds  Abdominal: Soft.  Musculoskeletal: She exhibits no edema.  Neurological:  Patient will not follow commands. She will move the left side purposely but won't minimally move right side. She has clonus on bilateral lower extremities. Weak gag reflex. Breathing spontaneously  Skin: Skin is warm.    ED Course  Procedures (including critical care time) Labs Review Labs Reviewed  CBC WITH DIFFERENTIAL - Abnormal; Notable for the following:     WBC 23.4 (*)    Neutro Abs 17.9 (*)    Monocytes Absolute 1.2 (*)    All other components within normal limits  COMPREHENSIVE METABOLIC PANEL - Abnormal; Notable for the following:    Potassium 3.1 (*)    Glucose, Bld 249 (*)    Anion gap 16 (*)    All other components within normal limits  TROPONIN I - Abnormal; Notable for the following:    Troponin I 5.10 (*)    All other components within normal limits  PRO B NATRIURETIC PEPTIDE - Abnormal; Notable for the following:    Pro B Natriuretic peptide (BNP) 3137.0 (*)    All other components within normal limits  BASIC METABOLIC PANEL - Abnormal; Notable for the following:    Glucose, Bld 162 (*)    Calcium 7.5 (*)    All other components within normal limits  PHOSPHORUS - Abnormal; Notable for the following:    Phosphorus 2.2 (*)    All other components within normal limits  BLOOD GAS, ARTERIAL - Abnormal; Notable for the following:    pH, Arterial 7.303 (*)    Bicarbonate 19.0 (*)    Acid-base deficit 6.0 (*)    All other components within normal limits  CBC - Abnormal; Notable for the following:    WBC 17.8 (*)    All other components within normal limits  CBC - Abnormal; Notable for the following:    WBC 21.3 (*)    All other components within normal limits  TROPONIN I - Abnormal; Notable for the following:    Troponin I 4.43 (*)    All other components within normal limits  TROPONIN I - Abnormal; Notable for the following:    Troponin I 5.40 (*)    All other components within normal limits  I-STAT CHEM 8, ED - Abnormal; Notable for the following:    Sodium 135 (*)    Potassium 7.5 (*)    Glucose, Bld 258 (*)    Calcium, Ion 0.96 (*)    Hemoglobin 16.3 (*)    HCT 48.0 (*)    All other components within normal limits  I-STAT TROPOININ, ED - Abnormal; Notable for the following:    Troponin i, poc 0.31 (*)    All other components within normal limits  I-STAT CHEM 8, ED - Abnormal; Notable for the following:     Potassium 3.0 (*)    Glucose, Bld 249 (*)    Calcium, Ion 1.06 (*)    Hemoglobin 15.3 (*)    All other components within normal limits  POCT I-STAT 7, (LYTES, BLD GAS, ICA,H+H) - Abnormal; Notable for the following:    pH, Arterial 7.248 (*)    pCO2 arterial 50.0 (*)    pO2, Arterial 144.0 (*)    Acid-base deficit 6.0 (*)  Potassium 2.8 (*)    Calcium, Ion 1.11 (*)    All other components within normal limits  POCT I-STAT 4, (NA,K, GLUC, HGB,HCT) - Abnormal; Notable for the following:    Potassium 2.9 (*)    Glucose, Bld 197 (*)    All other components within normal limits  POCT I-STAT 3, ART BLOOD GAS (G3+) - Abnormal; Notable for the following:    pH, Arterial 7.211 (*)    pCO2 arterial 47.5 (*)    Bicarbonate 19.1 (*)    Acid-base deficit 9.0 (*)    All other components within normal limits  POCT I-STAT 3, ART BLOOD GAS (G3+) - Abnormal; Notable for the following:    pCO2 arterial 32.6 (*)    pO2, Arterial 49.0 (*)    Acid-base deficit 3.0 (*)    All other components within normal limits  MRSA PCR SCREENING  ETHANOL  MAGNESIUM  PROTIME-INR  APTT  URINE RAPID DRUG SCREEN (HOSP PERFORMED)  BASIC METABOLIC PANEL  TROPONIN I    Imaging Review Ct Angio Head W/cm &/or Wo Cm  09/11/2014   CLINICAL DATA:  Code stroke.  Left-sided deficit.  EXAM: CT ANGIOGRAPHY HEAD  TECHNIQUE: Multidetector CT imaging of the head was performed using the standard protocol during bolus administration of intravenous contrast. Multiplanar CT image reconstructions and MIPs were obtained to evaluate the vascular anatomy.  CONTRAST:  50 mL MultiHance IV  COMPARISON:  None.  FINDINGS: Large subarachnoid hemorrhage. Diffuse bilateral subarachnoid hemorrhage is present in the sylvian fissures and in the basilar cisterns. There is clot in the suprasellar cistern extending into the interhemispheric fissure. The largest area of clot is in the region of the anterior communicating artery.  Ventricles are not  enlarged. No ventricular hemorrhage. There is calcified choroid in the fourth ventricle.  Negative for acute ischemic infarct. No shift of the midline structures.  Patient is intubated.  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 This has an irregular morphology measures 2.5 x 4 mm. The aneurysm projects inferiorly and is the source of subarachnoid hemorrhage. No other aneurysm  No significant vasospasm.  Both vertebral arteries are patent to the basilar. PICA patent bilaterally. Basilar widely patent. Superior cerebellar arteries are widely patent  Internal carotid artery patent bilaterally. Mild atherosclerotic disease in the cavernous carotid bilaterally. Anterior and middle cerebral arteries are patent bilaterally.  Review of the MIP images confirms the above findings.  IMPRESSION: Large subarachnoid hemorrhage. 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 No significant vasospasm.  3D imaging was generated and saved to PACS.  Ventricle size is normal.  No acute ischemic infarct.  Critical Value/emergent results were called by telephone at the time of interpretation on 09/11/2014 at 1:16 pm to Dr. Davonna Belling , who verbally acknowledged these results.   Electronically Signed   By: Franchot Gallo M.D.   On: 09/11/2014 13:24   Ct Head Wo Contrast  09/12/2014   CLINICAL DATA:  63 year old female with subarachnoid hemorrhage post coiling of anterior communicating artery aneurysm.  EXAM: CT HEAD WITHOUT CONTRAST  TECHNIQUE: Contiguous axial images were obtained from the base of the skull through the vertex without intravenous contrast.  COMPARISON:  09/11/2014.  FINDINGS: Diffuse subarachnoid hemorrhage with clot seen adjacent to the coiled anterior communicating artery aneurysm.  Intraventricular blood now noted. The ventricular size may be minimally more prominent than on the prior examination and close continued  surveillance recommended to  help determine if the patient develops hydrocephalus.  No large thrombotic infarct currently detected.  No intracranial mass lesion noted on this unenhanced exam.  IMPRESSION: Diffuse subarachnoid hemorrhage with clot seen adjacent to the coiled anterior communicating artery aneurysm.  Intraventricular blood now noted. The ventricular size may be minimally more prominent than on the prior examination and close continued surveillance recommended to help determine if the patient develops hydrocephalus.   Electronically Signed   By: Chauncey Cruel M.D.   On: 09/12/2014 06:24   Ir Transcath/emboliz  09/12/2014   PROCEDURE: 1. DIAGNOSTIC CEREBRAL ANGIOGRAM  2. COIL EMBOLIZATION OF RIGHT ANTERIOR CEREBRAL ARTERY ANEURYSM  OPERATORS:  Dr. Consuella Lose, MD.  HISTORY:  The patient is a 63 year old woman who presented to the emergency department with altered mental status. CT scan demonstrated diffuse subarachnoid hemorrhage, and CTA demonstrated the possibility of an anterior communicating artery aneurysm which may be amenable to coil embolization. The patient now presents for diagnostic cerebral angiogram with possible coil embolization.  APPROACH:  The technical aspects of the procedure as well as its potential risks and benefits were reviewed with the patient's family. These risks included but were not limited to bleeding, infection, allergic reaction, damage to organs/vital structures, stroke, inability to repair vessel/place stent, delayed aneurysm rupture, and catastrophic outcomes of heart attack, coma, and death. With the understanding of these risks, informed consent was obtained and witnessed.  The patient was placed in the supine position on the angiography table and the skin of the right groin was prepped in the usual sterile fashion. The procedure was performed under general anesthesia provided by the anesthesiology department.  A 5 French sheath was introduced in the right  femoral artery using Seldinger technique.  HEPARIN: 0 Units  FLUOROSCOPY TIME: 37.6 combined AP and Lateral minutes.  CONTRAST: 100cc Omnipaque 300  CATHETERS AND WIRES:  5-French JB-1 glide catheter  180 cm 0.035" glidewire  6-French 90cm Shuttle sheath  6-French Shuttle Select JB-1 catheter  0.072" Navien guidecatheter  150 cm Marksman microcatheter  Synchro 2 standard microwire  Excelsior SL-10 45 degree microcatheter  56mm x 47mm TransForm [place] balloon catheter  COILS USED (MRI compatible):  3.66mm x 8cm Target 360 Ultra  71mm x 6cm Target 360 Ultra  VESSELS CATHETERIZED:  Right common femoral artery  Right common carotid artery  Right internal carotid artery  Right vertebral artery  Left vertebral artery  Left common carotid artery  Right middle cerebral artery, M3 segment  Right anterior cerebral artery, A3 segment  VESSELS STUDIED:  Right common carotid: neck: AP and lateral  Right internal carotid: head: AP, lateral, obliques  Right vertebral: AP, lateral and trans-facial  Left common carotid: neck: AP and lateral  Left common carotid: head: AP, lateral, obliques  Left vertebral: AP, lateral  Right internal carotid artery, head: magnified obliques (pre-embolization)  Right internal carotid artery, head: magnified obliques (during coiling)  Right internal carotid artery, head: magnified obliques (immediate post-embolization)  Right internal carotid artery, head: AP, lateral (final control)  PROCEDURAL NARRATIVE:  A 5-Fr JB-1 terumo glide catheter was then advanced over a 0.035 glidewire into the aortic arch and the innominate and right common carotid artery followed by the internal carotid artery were selected. Cervical and cerebral angiography were performed. The JB-1 was then withdrawn into the innominate artery and the right subclavian artery followed by the right vertebral artery were selected. Cerebral angiography was performed. The JB-1 was then withdrawn into the aortic arch and the left common carotid  artery was selected. Cervical and cerebral angiography were performed. The JB-1 catheter was then withdrawn into the aortic arch and the left subclavian artery was selected followed by the left vertebral artery. Cerebral angiography was performed. The JB-1 catheter was removed without incident  Under real-time fluoroscopy, the shuttle select catheter was advanced into the right common carotid artery over an 0.035" guidewire. The guide sheath was then advanced into the right common carotid artery. After roadmap was taken, the sheath was advanced into the proximal internal carotid artery over the support of the select catheter and guidewire.The select catheter was removed and a 072 Navien guidecatheter was coaxially introduced over the Marksman microcatheter and microwire. Pre-embolization angiography was performed.  Under roadmap guidance, the Baltazar Apo was advanced over a Synchro 2 standard microwire into the M3 segment of the superior division. The Navien catheter was then tracked over the [facility] to its final position in the horizontal cavernous internal carotid artery. The Marksman was then removed and the coiling catheter and balloon catheter were introduced over the microwire in a biaxial fashion. The balloon catheter was then navigated into the A1 A2 junction, across the neck of the aneurysm over the support of a microwire which was positioned in the A3 segment. The coiling catheter was then navigated into the aneurysm lumen over the microwire. The wire was then removed. During placement of the framing coil, a portion of the coil appear to be extraluminal. There were no changes in vital signs noted at this point. The balloon catheter was then inflated, and 20 mg of protamine was immediately administered by anesthesia. This coil was then deployed. The balloon was then deflated, and angiogram was taken which did not demonstrate any contrast extravasation. The second above coil was then deployed.  Cerebral  angiography was performed immediately post embolization. The coiling and balloon catheters were then removed and the Navien withdrawn into the cervical internal carotid artery.Final control angiography was then performed from the Navien guide catheter.  The Navien catheter and sheath were then synchronously removed without incident. The 8-Fr sheath was removed and exchanged for a 7 [place] ExoSeal closure device which was deployed successfully.  INTERPRETATION:  Right femoral artery:  The right femoral artery is unremarkable, arterial sheath in adequate position.  Right common carotid: neck:  There is no significant stenosis, occlusion, aneurysm or plaque visualized on this injection.  Right internal carotid: head:  Injection reveals the presence of a widely patent ICA, M1, and A1 segments and their branches. There is an irregularly shaped inferiorly and leftward projecting aneurysm arising from the right A1 A2 junction. Flash filling across the anterior communicating artery of the contralateral A2 segment is seen. This aneurysm measures approximately 4.2 x 3.8 mm. It appears to have a relatively narrow neck. The parenchymal and venous phases are normal. The venous sinuses are widely patent.  Left common carotid: neck:  There is no significant stenosis, occlusion, aneurysm or plaque visualized on this injection. 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  Left common carotid: head:  Injection reveals the presence of a widely patent ICA, A1, and M1 segments and their branches. There is no significant stenosis, occlusion, aneurysm, or high flow vascular malformation visualized. Filling of the anterior communicating artery aneurysm is not seen on this left-sided injection. The parenchymal and venous phases are normal. The venous sinuses are widely patent.  Left vertebral:  Injection reveals the presence of a widely patent vertebral artery. This leads to a  widely patent basilar artery  that terminates in bilateral P1. The basilar apex is normal. There is no significant stenosis, occlusion, aneurysm, or vascular malformation visualized. The parenchymal and venous phases are normal. The venous sinuses are widely patent.  Right vertebral:  Normal vessel. No PICA aneurysm. See basilar description above.  Right internal carotid artery, head (pre-embolization):  Injection reveals the presence of a widely patent ICA that leads to a patent ACA and MCA. The ICA aneurysm is again visualized as described in the diagnostic portion of the procedure.  Right internal carotid artery, head (during embolization):  Balloon catheter position is seen across the neck of the aneurysm. Coil mass is also seen within the aneurysm, however a portion of the coil mass appears to extend more inferiorly, and may be extraluminal. No contrast filling of the actual aneurysm is seen. No contrast extravasation is seen.  Right internal carotid artery, head (immediate post-embolization):  Injection reveals the presence of a widely patent ICA that leads to a patent ACA and MCA. Coil mass within the aneurysm is stable, without coil prolapse or filling defect to suggest thrombus. No aneurysm filling is seen. The distal right anterior cerebral artery is widely patent, and flash filling across the communicating artery of the left ACA is also seen.  Right internal carotid artery, head (final control):  Injection reveals the presence of a widely patent ICA that leads to a patent ACA and MCA. No thrombus is visualized. Coil mass is seen within the aneurysm without any aneurysm filling seen. The parenchymal phase does not demonstrate any perfusion deficits. Venous phase is unremarkable.  DISPOSITION:  After completion of the procedure, the femoral sheath was replaced with a closure device which was deployed successfully.  The patient was kept intubated and transferred to the neuro critical care unit for further care.  IMPRESSION:  1.  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 No aneurysm filling is seen post-embolization.   Electronically Signed   By: Consuella Lose   On: 09/12/2014 19:52   Ir Angiogram Follow Up Study  09/12/2014   PROCEDURE: 1. DIAGNOSTIC CEREBRAL ANGIOGRAM  2. COIL EMBOLIZATION OF RIGHT ANTERIOR CEREBRAL ARTERY ANEURYSM  OPERATORS:  Dr. Consuella Lose, MD.  HISTORY:  The patient is a 63 year old woman who presented to the emergency department with altered mental status. CT scan demonstrated diffuse subarachnoid hemorrhage, and CTA demonstrated the possibility of an anterior communicating artery aneurysm which may be amenable to coil embolization. The patient now presents for diagnostic cerebral angiogram with possible coil embolization.  APPROACH:  The technical aspects of the procedure as well as its potential risks and benefits were reviewed with the patient's family. These risks included but were not limited to bleeding, infection, allergic reaction, damage to organs/vital structures, stroke, inability to repair vessel/place stent, delayed aneurysm rupture, and catastrophic outcomes of heart attack, coma, and death. With the understanding of these risks, informed consent was obtained and witnessed.  The patient was placed in the supine position on the angiography table and the skin of the right groin was prepped in the usual sterile fashion. The procedure was performed under general anesthesia provided by the anesthesiology department.  A 5 French sheath was introduced in the right femoral artery using Seldinger technique.  HEPARIN: 0 Units  FLUOROSCOPY TIME: 37.6 combined AP and Lateral minutes.  CONTRAST: 100cc Omnipaque 300  CATHETERS AND WIRES:  5-French JB-1 glide catheter  180 cm 0.035" glidewire  6-French 90cm Shuttle sheath  6-French Shuttle Select JB-1 catheter  0.072" Navien guidecatheter  150 cm Marksman microcatheter  Synchro 2 standard microwire   Excelsior SL-10 45 degree microcatheter  86mm x 45mm TransForm [place] balloon catheter  COILS USED (MRI compatible):  3.41mm x 8cm Target 360 Ultra  64mm x 6cm Target 360 Ultra  VESSELS CATHETERIZED:  Right common femoral artery  Right common carotid artery  Right internal carotid artery  Right vertebral artery  Left vertebral artery  Left common carotid artery  Right middle cerebral artery, M3 segment  Right anterior cerebral artery, A3 segment  VESSELS STUDIED:  Right common carotid: neck: AP and lateral  Right internal carotid: head: AP, lateral, obliques  Right vertebral: AP, lateral and trans-facial  Left common carotid: neck: AP and lateral  Left common carotid: head: AP, lateral, obliques  Left vertebral: AP, lateral  Right internal carotid artery, head: magnified obliques (pre-embolization)  Right internal carotid artery, head: magnified obliques (during coiling)  Right internal carotid artery, head: magnified obliques (immediate post-embolization)  Right internal carotid artery, head: AP, lateral (final control)  PROCEDURAL NARRATIVE:  A 5-Fr JB-1 terumo glide catheter was then advanced over a 0.035 glidewire into the aortic arch and the innominate and right common carotid artery followed by the internal carotid artery were selected. Cervical and cerebral angiography were performed. The JB-1 was then withdrawn into the innominate artery and the right subclavian artery followed by the right vertebral artery were selected. Cerebral angiography was performed. The JB-1 was then withdrawn into the aortic arch and the left common carotid artery was selected. Cervical and cerebral angiography were performed. The JB-1 catheter was then withdrawn into the aortic arch and the left subclavian artery was selected followed by the left vertebral artery. Cerebral angiography was performed. The JB-1 catheter was removed without incident  Under real-time fluoroscopy, the shuttle select catheter was advanced into the right common  carotid artery over an 0.035" guidewire. The guide sheath was then advanced into the right common carotid artery. After roadmap was taken, the sheath was advanced into the proximal internal carotid artery over the support of the select catheter and guidewire.The select catheter was removed and a 072 Navien guidecatheter was coaxially introduced over the Marksman microcatheter and microwire. Pre-embolization angiography was performed.  Under roadmap guidance, the Baltazar Apo was advanced over a Synchro 2 standard microwire into the M3 segment of the superior division. The Navien catheter was then tracked over the [facility] to its final position in the horizontal cavernous internal carotid artery. The Marksman was then removed and the coiling catheter and balloon catheter were introduced over the microwire in a biaxial fashion. The balloon catheter was then navigated into the A1 A2 junction, across the neck of the aneurysm over the support of a microwire which was positioned in the A3 segment. The coiling catheter was then navigated into the aneurysm lumen over the microwire. The wire was then removed. During placement of the framing coil, a portion of the coil appear to be extraluminal. There were no changes in vital signs noted at this point. The balloon catheter was then inflated, and 20 mg of protamine was immediately administered by anesthesia. This coil was then deployed. The balloon was then deflated, and angiogram was taken which did not demonstrate any contrast extravasation. The second above coil was then deployed.  Cerebral angiography was performed immediately post embolization. The coiling and balloon catheters were then removed and the Navien withdrawn into the cervical internal carotid artery.Final control angiography was then performed  from the Navien guide catheter.  The Navien catheter and sheath were then synchronously removed without incident. The 8-Fr sheath was removed and exchanged for a 7 [place]  ExoSeal closure device which was deployed successfully.  INTERPRETATION:  Right femoral artery:  The right femoral artery is unremarkable, arterial sheath in adequate position.  Right common carotid: neck:  There is no significant stenosis, occlusion, aneurysm or plaque visualized on this injection.  Right internal carotid: head:  Injection reveals the presence of a widely patent ICA, M1, and A1 segments and their branches. There is an irregularly shaped inferiorly and leftward projecting aneurysm arising from the right A1 A2 junction. Flash filling across the anterior communicating artery of the contralateral A2 segment is seen. This aneurysm measures approximately 4.2 x 3.8 mm. It appears to have a relatively narrow neck. The parenchymal and venous phases are normal. The venous sinuses are widely patent.  Left common carotid: neck:  There is no significant stenosis, occlusion, aneurysm or plaque visualized on this injection. 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  Left common carotid: head:  Injection reveals the presence of a widely patent ICA, A1, and M1 segments and their branches. There is no significant stenosis, occlusion, aneurysm, or high flow vascular malformation visualized. Filling of the anterior communicating artery aneurysm is not seen on this left-sided injection. The parenchymal and venous phases are normal. The venous sinuses are widely patent.  Left vertebral:  Injection reveals the presence of a widely patent vertebral artery. This leads to a widely patent basilar artery that terminates in bilateral P1. The basilar apex is normal. There is no significant stenosis, occlusion, aneurysm, or vascular malformation visualized. The parenchymal and venous phases are normal. The venous sinuses are widely patent.  Right vertebral:  Normal vessel. No PICA aneurysm. See basilar description above.  Right internal carotid artery, head (pre-embolization):  Injection  reveals the presence of a widely patent ICA that leads to a patent ACA and MCA. The ICA aneurysm is again visualized as described in the diagnostic portion of the procedure.  Right internal carotid artery, head (during embolization):  Balloon catheter position is seen across the neck of the aneurysm. Coil mass is also seen within the aneurysm, however a portion of the coil mass appears to extend more inferiorly, and may be extraluminal. No contrast filling of the actual aneurysm is seen. No contrast extravasation is seen.  Right internal carotid artery, head (immediate post-embolization):  Injection reveals the presence of a widely patent ICA that leads to a patent ACA and MCA. Coil mass within the aneurysm is stable, without coil prolapse or filling defect to suggest thrombus. No aneurysm filling is seen. The distal right anterior cerebral artery is widely patent, and flash filling across the communicating artery of the left ACA is also seen.  Right internal carotid artery, head (final control):  Injection reveals the presence of a widely patent ICA that leads to a patent ACA and MCA. No thrombus is visualized. Coil mass is seen within the aneurysm without any aneurysm filling seen. The parenchymal phase does not demonstrate any perfusion deficits. Venous phase is unremarkable.  DISPOSITION:  After completion of the procedure, the femoral sheath was replaced with a closure device which was deployed successfully.  The patient was kept intubated and transferred to the neuro critical care unit for further care.  IMPRESSION:  1. 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 No aneurysm filling  is seen post-embolization.   Electronically Signed   By: Consuella Lose   On: 09/12/2014 19:52   Ir Angiogram Follow Up Study  09/12/2014   PROCEDURE: 1. DIAGNOSTIC CEREBRAL ANGIOGRAM  2. COIL EMBOLIZATION OF RIGHT ANTERIOR CEREBRAL ARTERY ANEURYSM  OPERATORS:  Dr. Consuella Lose, MD.  HISTORY:  The patient is a 63 year old woman who presented to the emergency department with altered mental status. CT scan demonstrated diffuse subarachnoid hemorrhage, and CTA demonstrated the possibility of an anterior communicating artery aneurysm which may be amenable to coil embolization. The patient now presents for diagnostic cerebral angiogram with possible coil embolization.  APPROACH:  The technical aspects of the procedure as well as its potential risks and benefits were reviewed with the patient's family. These risks included but were not limited to bleeding, infection, allergic reaction, damage to organs/vital structures, stroke, inability to repair vessel/place stent, delayed aneurysm rupture, and catastrophic outcomes of heart attack, coma, and death. With the understanding of these risks, informed consent was obtained and witnessed.  The patient was placed in the supine position on the angiography table and the skin of the right groin was prepped in the usual sterile fashion. The procedure was performed under general anesthesia provided by the anesthesiology department.  A 5 French sheath was introduced in the right femoral artery using Seldinger technique.  HEPARIN: 0 Units  FLUOROSCOPY TIME: 37.6 combined AP and Lateral minutes.  CONTRAST: 100cc Omnipaque 300  CATHETERS AND WIRES:  5-French JB-1 glide catheter  180 cm 0.035" glidewire  6-French 90cm Shuttle sheath  6-French Shuttle Select JB-1 catheter  0.072" Navien guidecatheter  150 cm Marksman microcatheter  Synchro 2 standard microwire  Excelsior SL-10 45 degree microcatheter  48mm x 5mm TransForm Artemus balloon catheter  COILS USED (MRI compatible):  3.34mm x 8cm Target 360 Ultra  34mm x 6cm Target 360 Ultra  VESSELS CATHETERIZED:  Right common femoral artery  Right common carotid artery  Right internal carotid artery  Right vertebral artery  Left vertebral artery  Left common carotid artery  Right middle cerebral artery, M3 segment   Right anterior cerebral artery, A3 segment  VESSELS STUDIED:  Right common carotid: neck: AP and lateral  Right internal carotid: head: AP, lateral, obliques  Right vertebral: AP, lateral and trans-facial  Left common carotid: neck: AP and lateral  Left common carotid: head: AP, lateral, obliques  Left vertebral: AP, lateral  Right internal carotid artery, head: magnified obliques (pre-embolization)  Right internal carotid artery, head: magnified obliques (during coiling)  Right internal carotid artery, head: magnified obliques (immediate post-embolization)  Right internal carotid artery, head: AP, lateral (final control)  PROCEDURAL NARRATIVE:  A 5-Fr JB-1 terumo glide catheter was then advanced over a 0.035 glidewire into the aortic arch and the innominate and right common carotid artery followed by the internal carotid artery were selected. Cervical and cerebral angiography were performed. The JB-1 was then withdrawn into the innominate artery and the right subclavian artery followed by the right vertebral artery were selected. Cerebral angiography was performed. The JB-1 was then withdrawn into the aortic arch and the left common carotid artery was selected. Cervical and cerebral angiography were performed. The JB-1 catheter was then withdrawn into the aortic arch and the left subclavian artery was selected followed by the left vertebral artery. Cerebral angiography was performed. The JB-1 catheter was removed without incident  Under real-time fluoroscopy, the shuttle select catheter was advanced into the right common carotid artery over an 0.035" guidewire. The guide  sheath was then advanced into the right common carotid artery. After roadmap was taken, the sheath was advanced into the proximal internal carotid artery over the support of the select catheter and guidewire.The select catheter was removed and a 072 Navien guidecatheter was coaxially introduced over the Marksman microcatheter and microwire.  Pre-embolization angiography was performed.  Under roadmap guidance, the Baltazar Apo was advanced over a Synchro 2 standard microwire into the M3 segment of the superior division. The Navien catheter was then tracked over the [facility] to its final position in the horizontal cavernous internal carotid artery. The Marksman was then removed and the coiling catheter and balloon catheter were introduced over the microwire in a biaxial fashion. The balloon catheter was then navigated into the A1 A2 junction, across the neck of the aneurysm over the support of a microwire which was positioned in the A3 segment. The coiling catheter was then navigated into the aneurysm lumen over the microwire. The wire was then removed. During placement of the framing coil, a portion of the coil appear to be extraluminal. There were no changes in vital signs noted at this point. The balloon catheter was then inflated, and 20 mg of protamine was immediately administered by anesthesia. This coil was then deployed. The balloon was then deflated, and angiogram was taken which did not demonstrate any contrast extravasation. The second above coil was then deployed.  Cerebral angiography was performed immediately post embolization. The coiling and balloon catheters were then removed and the Navien withdrawn into the cervical internal carotid artery.Final control angiography was then performed from the Navien guide catheter.  The Navien catheter and sheath were then synchronously removed without incident. The 8-Fr sheath was removed and exchanged for a 7 [place] ExoSeal closure device which was deployed successfully.  INTERPRETATION:  Right femoral artery:  The right femoral artery is unremarkable, arterial sheath in adequate position.  Right common carotid: neck:  There is no significant stenosis, occlusion, aneurysm or plaque visualized on this injection.  Right internal carotid: head:  Injection reveals the presence of a widely patent ICA, M1, and  A1 segments and their branches. There is an irregularly shaped inferiorly and leftward projecting aneurysm arising from the right A1 A2 junction. Flash filling across the anterior communicating artery of the contralateral A2 segment is seen. This aneurysm measures approximately 4.2 x 3.8 mm. It appears to have a relatively narrow neck. The parenchymal and venous phases are normal. The venous sinuses are widely patent.  Left common carotid: neck:  There is no significant stenosis, occlusion, aneurysm or plaque visualized on this injection. 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  Left common carotid: head:  Injection reveals the presence of a widely patent ICA, A1, and M1 segments and their branches. There is no significant stenosis, occlusion, aneurysm, or high flow vascular malformation visualized. Filling of the anterior communicating artery aneurysm is not seen on this left-sided injection. The parenchymal and venous phases are normal. The venous sinuses are widely patent.  Left vertebral:  Injection reveals the presence of a widely patent vertebral artery. This leads to a widely patent basilar artery that terminates in bilateral P1. The basilar apex is normal. There is no significant stenosis, occlusion, aneurysm, or vascular malformation visualized. The parenchymal and venous phases are normal. The venous sinuses are widely patent.  Right vertebral:  Normal vessel. No PICA aneurysm. See basilar description above.  Right internal carotid artery, head (pre-embolization):  Injection reveals the presence of  a widely patent ICA that leads to a patent ACA and MCA. The ICA aneurysm is again visualized as described in the diagnostic portion of the procedure.  Right internal carotid artery, head (during embolization):  Balloon catheter position is seen across the neck of the aneurysm. Coil mass is also seen within the aneurysm, however a portion of the coil mass appears to extend  more inferiorly, and may be extraluminal. No contrast filling of the actual aneurysm is seen. No contrast extravasation is seen.  Right internal carotid artery, head (immediate post-embolization):  Injection reveals the presence of a widely patent ICA that leads to a patent ACA and MCA. Coil mass within the aneurysm is stable, without coil prolapse or filling defect to suggest thrombus. No aneurysm filling is seen. The distal right anterior cerebral artery is widely patent, and flash filling across the communicating artery of the left ACA is also seen.  Right internal carotid artery, head (final control):  Injection reveals the presence of a widely patent ICA that leads to a patent ACA and MCA. No thrombus is visualized. Coil mass is seen within the aneurysm without any aneurysm filling seen. The parenchymal phase does not demonstrate any perfusion deficits. Venous phase is unremarkable.  DISPOSITION:  After completion of the procedure, the femoral sheath was replaced with a closure device which was deployed successfully.  The patient was kept intubated and transferred to the neuro critical care unit for further care.  IMPRESSION:  1. 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 No aneurysm filling is seen post-embolization.   Electronically Signed   By: Consuella Lose   On: 09/12/2014 19:52   Dg Chest Port 1 View  09/12/2014   CLINICAL DATA:  Evaluate endotracheal tube position  EXAM: PORTABLE CHEST - 1 VIEW  COMPARISON:  Portable chest x-ray of 09/11/2014  FINDINGS: There is little change in probable pulmonary vascular congestion with effusions. The heart is mildly enlarged and stable. The tip of the endotracheal tube is approximately 5.4 cm above the carina. NG tube remains.  IMPRESSION: 1. Moderate pulmonary vascular congestion with effusions. 2. Tip of endotracheal tube approximately 5.4 cm above the carina.   Electronically Signed   By: Ivar Drape M.D.   On:  09/12/2014 08:19   Dg Chest Port 1 View  09/11/2014   CLINICAL DATA:  Check endotracheal tube position.  EXAM: PORTABLE CHEST - 1 VIEW  COMPARISON:  09/11/2014 at 1332 hr.  FINDINGS: Her radiographs at 1939 hr.  Endotracheal tube is in satisfactory position. The distal tip is at the level of the clavicular heads, and 6.5 cm above the carina. Nasogastric tube courses into the stomach and below the edge of the image. Pulmonary vascular congestion and mild pulmonary edema pattern is unchanged. Underlying emphysematous changes suspected. Negative for pneumothorax. No visible pleural effusion.  IMPRESSION: Satisfactory position of endotracheal tube. No significant change in mild pulmonary edema and vascular congestion. Suspect underlying emphysema.   Electronically Signed   By: Curlene Dolphin M.D.   On: 09/11/2014 20:07   Dg Chest Port 1 View  09/11/2014   CLINICAL DATA:  Altered mental status.  EXAM: PORTABLE CHEST - 1 VIEW  COMPARISON:  None.  FINDINGS: The patient is slightly rotated to the right. Cardiac silhouette is within normal limits for size. Mild thoracic aortic calcification is noted. Multiple monitoring leads overlie the chest, predominantly on the right. There is pulmonary vascular congestion with interstitial densities bilaterally, greatest in the lung  bases. No pleural effusion is identified, although the left lateral costophrenic angle is incompletely imaged. No pneumothorax is seen. No acute osseous abnormality is identified.  IMPRESSION: Pulmonary vascular congestion and bilateral lung opacities compatible with mild pulmonary edema.   Electronically Signed   By: Logan Bores   On: 09/11/2014 13:43   Dg Abd Portable 1v  09/11/2014   CLINICAL DATA:  Orogastric tube placement  EXAM: PORTABLE ABDOMEN - 1 VIEW  COMPARISON:  None.  FINDINGS: The tip of the gastric tube is in the fundus of the stomach. Iodinated contrast is present in the collecting system of the kidneys and bladder. No  disproportionate dilatation of bowel. Stomach is decompressed.  IMPRESSION: NG tube tip is in the fundus of the stomach. Stomach is decompressed.   Electronically Signed   By: Maryclare Bean M.D.   On: 09/11/2014 20:24   Ir Angio Intra Extracran Sel Internal Carotid Uni R Mod Sed  09/12/2014   PROCEDURE: 1. DIAGNOSTIC CEREBRAL ANGIOGRAM  2. COIL EMBOLIZATION OF RIGHT ANTERIOR CEREBRAL ARTERY ANEURYSM  OPERATORS:  Dr. Consuella Lose, MD.  HISTORY:  The patient is a 63 year old woman who presented to the emergency department with altered mental status. CT scan demonstrated diffuse subarachnoid hemorrhage, and CTA demonstrated the possibility of an anterior communicating artery aneurysm which may be amenable to coil embolization. The patient now presents for diagnostic cerebral angiogram with possible coil embolization.  APPROACH:  The technical aspects of the procedure as well as its potential risks and benefits were reviewed with the patient's family. These risks included but were not limited to bleeding, infection, allergic reaction, damage to organs/vital structures, stroke, inability to repair vessel/place stent, delayed aneurysm rupture, and catastrophic outcomes of heart attack, coma, and death. With the understanding of these risks, informed consent was obtained and witnessed.  The patient was placed in the supine position on the angiography table and the skin of the right groin was prepped in the usual sterile fashion. The procedure was performed under general anesthesia provided by the anesthesiology department.  A 5 French sheath was introduced in the right femoral artery using Seldinger technique.  HEPARIN: 0 Units  FLUOROSCOPY TIME: 37.6 combined AP and Lateral minutes.  CONTRAST: 100cc Omnipaque 300  CATHETERS AND WIRES:  5-French JB-1 glide catheter  180 cm 0.035" glidewire  6-French 90cm Shuttle sheath  6-French Shuttle Select JB-1 catheter  0.072" Navien guidecatheter  150 cm Marksman microcatheter   Synchro 2 standard microwire  Excelsior SL-10 45 degree microcatheter  62mm x 65mm TransForm Remy balloon catheter  COILS USED (MRI compatible):  3.39mm x 8cm Target 360 Ultra  88mm x 6cm Target 360 Ultra  VESSELS CATHETERIZED:  Right common femoral artery  Right common carotid artery  Right internal carotid artery  Right vertebral artery  Left vertebral artery  Left common carotid artery  Right middle cerebral artery, M3 segment  Right anterior cerebral artery, A3 segment  VESSELS STUDIED:  Right common carotid: neck: AP and lateral  Right internal carotid: head: AP, lateral, obliques  Right vertebral: AP, lateral and trans-facial  Left common carotid: neck: AP and lateral  Left common carotid: head: AP, lateral, obliques  Left vertebral: AP, lateral  Right internal carotid artery, head: magnified obliques (pre-embolization)  Right internal carotid artery, head: magnified obliques (during coiling)  Right internal carotid artery, head: magnified obliques (immediate post-embolization)  Right internal carotid artery, head: AP, lateral (final control)  PROCEDURAL NARRATIVE:  A 5-Fr JB-1 terumo glide catheter was then  advanced over a 0.035 glidewire into the aortic arch and the innominate and right common carotid artery followed by the internal carotid artery were selected. Cervical and cerebral angiography were performed. The JB-1 was then withdrawn into the innominate artery and the right subclavian artery followed by the right vertebral artery were selected. Cerebral angiography was performed. The JB-1 was then withdrawn into the aortic arch and the left common carotid artery was selected. Cervical and cerebral angiography were performed. The JB-1 catheter was then withdrawn into the aortic arch and the left subclavian artery was selected followed by the left vertebral artery. Cerebral angiography was performed. The JB-1 catheter was removed without incident  Under real-time fluoroscopy, the shuttle select catheter was  advanced into the right common carotid artery over an 0.035" guidewire. The guide sheath was then advanced into the right common carotid artery. After roadmap was taken, the sheath was advanced into the proximal internal carotid artery over the support of the select catheter and guidewire.The select catheter was removed and a 072 Navien guidecatheter was coaxially introduced over the Marksman microcatheter and microwire. Pre-embolization angiography was performed.  Under roadmap guidance, the Baltazar Apo was advanced over a Synchro 2 standard microwire into the M3 segment of the superior division. The Navien catheter was then tracked over the [facility] to its final position in the horizontal cavernous internal carotid artery. The Marksman was then removed and the coiling catheter and balloon catheter were introduced over the microwire in a biaxial fashion. The balloon catheter was then navigated into the A1 A2 junction, across the neck of the aneurysm over the support of a microwire which was positioned in the A3 segment. The coiling catheter was then navigated into the aneurysm lumen over the microwire. The wire was then removed. During placement of the framing coil, a portion of the coil appear to be extraluminal. There were no changes in vital signs noted at this point. The balloon catheter was then inflated, and 20 mg of protamine was immediately administered by anesthesia. This coil was then deployed. The balloon was then deflated, and angiogram was taken which did not demonstrate any contrast extravasation. The second above coil was then deployed.  Cerebral angiography was performed immediately post embolization. The coiling and balloon catheters were then removed and the Navien withdrawn into the cervical internal carotid artery.Final control angiography was then performed from the Navien guide catheter.  The Navien catheter and sheath were then synchronously removed without incident. The 8-Fr sheath was removed  and exchanged for a 7 [place] ExoSeal closure device which was deployed successfully.  INTERPRETATION:  Right femoral artery:  The right femoral artery is unremarkable, arterial sheath in adequate position.  Right common carotid: neck:  There is no significant stenosis, occlusion, aneurysm or plaque visualized on this injection.  Right internal carotid: head:  Injection reveals the presence of a widely patent ICA, M1, and A1 segments and their branches. There is an irregularly shaped inferiorly and leftward projecting aneurysm arising from the right A1 A2 junction. Flash filling across the anterior communicating artery of the contralateral A2 segment is seen. This aneurysm measures approximately 4.2 x 3.8 mm. It appears to have a relatively narrow neck. The parenchymal and venous phases are normal. The venous sinuses are widely patent.  Left common carotid: neck:  There is no significant stenosis, occlusion, aneurysm or plaque visualized on this injection. 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  Left common carotid: head:  Injection reveals  the presence of a widely patent ICA, A1, and M1 segments and their branches. There is no significant stenosis, occlusion, aneurysm, or high flow vascular malformation visualized. Filling of the anterior communicating artery aneurysm is not seen on this left-sided injection. The parenchymal and venous phases are normal. The venous sinuses are widely patent.  Left vertebral:  Injection reveals the presence of a widely patent vertebral artery. This leads to a widely patent basilar artery that terminates in bilateral P1. The basilar apex is normal. There is no significant stenosis, occlusion, aneurysm, or vascular malformation visualized. The parenchymal and venous phases are normal. The venous sinuses are widely patent.  Right vertebral:  Normal vessel. No PICA aneurysm. See basilar description above.  Right internal carotid artery, head  (pre-embolization):  Injection reveals the presence of a widely patent ICA that leads to a patent ACA and MCA. The ICA aneurysm is again visualized as described in the diagnostic portion of the procedure.  Right internal carotid artery, head (during embolization):  Balloon catheter position is seen across the neck of the aneurysm. Coil mass is also seen within the aneurysm, however a portion of the coil mass appears to extend more inferiorly, and may be extraluminal. No contrast filling of the actual aneurysm is seen. No contrast extravasation is seen.  Right internal carotid artery, head (immediate post-embolization):  Injection reveals the presence of a widely patent ICA that leads to a patent ACA and MCA. Coil mass within the aneurysm is stable, without coil prolapse or filling defect to suggest thrombus. No aneurysm filling is seen. The distal right anterior cerebral artery is widely patent, and flash filling across the communicating artery of the left ACA is also seen.  Right internal carotid artery, head (final control):  Injection reveals the presence of a widely patent ICA that leads to a patent ACA and MCA. No thrombus is visualized. Coil mass is seen within the aneurysm without any aneurysm filling seen. The parenchymal phase does not demonstrate any perfusion deficits. Venous phase is unremarkable.  DISPOSITION:  After completion of the procedure, the femoral sheath was replaced with a closure device which was deployed successfully.  The patient was kept intubated and transferred to the neuro critical care unit for further care.  IMPRESSION:  1. 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 No aneurysm filling is seen post-embolization.   Electronically Signed   By: Consuella Lose   On: 09/12/2014 19:52   Ir Angio Vertebral Sel Vertebral Bilat Mod Sed  09/12/2014   PROCEDURE: 1. DIAGNOSTIC CEREBRAL ANGIOGRAM  2. COIL EMBOLIZATION OF RIGHT ANTERIOR  CEREBRAL ARTERY ANEURYSM  OPERATORS:  Dr. Consuella Lose, MD.  HISTORY:  The patient is a 63 year old woman who presented to the emergency department with altered mental status. CT scan demonstrated diffuse subarachnoid hemorrhage, and CTA demonstrated the possibility of an anterior communicating artery aneurysm which may be amenable to coil embolization. The patient now presents for diagnostic cerebral angiogram with possible coil embolization.  APPROACH:  The technical aspects of the procedure as well as its potential risks and benefits were reviewed with the patient's family. These risks included but were not limited to bleeding, infection, allergic reaction, damage to organs/vital structures, stroke, inability to repair vessel/place stent, delayed aneurysm rupture, and catastrophic outcomes of heart attack, coma, and death. With the understanding of these risks, informed consent was obtained and witnessed.  The patient was placed in the supine position on the angiography table and the skin of  the right groin was prepped in the usual sterile fashion. The procedure was performed under general anesthesia provided by the anesthesiology department.  A 5 French sheath was introduced in the right femoral artery using Seldinger technique.  HEPARIN: 0 Units  FLUOROSCOPY TIME: 37.6 combined AP and Lateral minutes.  CONTRAST: 100cc Omnipaque 300  CATHETERS AND WIRES:  5-French JB-1 glide catheter  180 cm 0.035" glidewire  6-French 90cm Shuttle sheath  6-French Shuttle Select JB-1 catheter  0.072" Navien guidecatheter  150 cm Marksman microcatheter  Synchro 2 standard microwire  Excelsior SL-10 45 degree microcatheter  34mm x 50mm TransForm [place] balloon catheter  COILS USED (MRI compatible):  3.20mm x 8cm Target 360 Ultra  72mm x 6cm Target 360 Ultra  VESSELS CATHETERIZED:  Right common femoral artery  Right common carotid artery  Right internal carotid artery  Right vertebral artery  Left vertebral artery  Left common carotid  artery  Right middle cerebral artery, M3 segment  Right anterior cerebral artery, A3 segment  VESSELS STUDIED:  Right common carotid: neck: AP and lateral  Right internal carotid: head: AP, lateral, obliques  Right vertebral: AP, lateral and trans-facial  Left common carotid: neck: AP and lateral  Left common carotid: head: AP, lateral, obliques  Left vertebral: AP, lateral  Right internal carotid artery, head: magnified obliques (pre-embolization)  Right internal carotid artery, head: magnified obliques (during coiling)  Right internal carotid artery, head: magnified obliques (immediate post-embolization)  Right internal carotid artery, head: AP, lateral (final control)  PROCEDURAL NARRATIVE:  A 5-Fr JB-1 terumo glide catheter was then advanced over a 0.035 glidewire into the aortic arch and the innominate and right common carotid artery followed by the internal carotid artery were selected. Cervical and cerebral angiography were performed. The JB-1 was then withdrawn into the innominate artery and the right subclavian artery followed by the right vertebral artery were selected. Cerebral angiography was performed. The JB-1 was then withdrawn into the aortic arch and the left common carotid artery was selected. Cervical and cerebral angiography were performed. The JB-1 catheter was then withdrawn into the aortic arch and the left subclavian artery was selected followed by the left vertebral artery. Cerebral angiography was performed. The JB-1 catheter was removed without incident  Under real-time fluoroscopy, the shuttle select catheter was advanced into the right common carotid artery over an 0.035" guidewire. The guide sheath was then advanced into the right common carotid artery. After roadmap was taken, the sheath was advanced into the proximal internal carotid artery over the support of the select catheter and guidewire.The select catheter was removed and a 072 Navien guidecatheter was coaxially introduced over  the Marksman microcatheter and microwire. Pre-embolization angiography was performed.  Under roadmap guidance, the Baltazar Apo was advanced over a Synchro 2 standard microwire into the M3 segment of the superior division. The Navien catheter was then tracked over the [facility] to its final position in the horizontal cavernous internal carotid artery. The Marksman was then removed and the coiling catheter and balloon catheter were introduced over the microwire in a biaxial fashion. The balloon catheter was then navigated into the A1 A2 junction, across the neck of the aneurysm over the support of a microwire which was positioned in the A3 segment. The coiling catheter was then navigated into the aneurysm lumen over the microwire. The wire was then removed. During placement of the framing coil, a portion of the coil appear to be extraluminal. There were no changes in vital signs noted at this point.  The balloon catheter was then inflated, and 20 mg of protamine was immediately administered by anesthesia. This coil was then deployed. The balloon was then deflated, and angiogram was taken which did not demonstrate any contrast extravasation. The second above coil was then deployed.  Cerebral angiography was performed immediately post embolization. The coiling and balloon catheters were then removed and the Navien withdrawn into the cervical internal carotid artery.Final control angiography was then performed from the Navien guide catheter.  The Navien catheter and sheath were then synchronously removed without incident. The 8-Fr sheath was removed and exchanged for a 7 [place] ExoSeal closure device which was deployed successfully.  INTERPRETATION:  Right femoral artery:  The right femoral artery is unremarkable, arterial sheath in adequate position.  Right common carotid: neck:  There is no significant stenosis, occlusion, aneurysm or plaque visualized on this injection.  Right internal carotid: head:  Injection reveals the  presence of a widely patent ICA, M1, and A1 segments and their branches. There is an irregularly shaped inferiorly and leftward projecting aneurysm arising from the right A1 A2 junction. Flash filling across the anterior communicating artery of the contralateral A2 segment is seen. This aneurysm measures approximately 4.2 x 3.8 mm. It appears to have a relatively narrow neck. The parenchymal and venous phases are normal. The venous sinuses are widely patent.  Left common carotid: neck:  There is no significant stenosis, occlusion, aneurysm or plaque visualized on this injection. 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  Left common carotid: head:  Injection reveals the presence of a widely patent ICA, A1, and M1 segments and their branches. There is no significant stenosis, occlusion, aneurysm, or high flow vascular malformation visualized. Filling of the anterior communicating artery aneurysm is not seen on this left-sided injection. The parenchymal and venous phases are normal. The venous sinuses are widely patent.  Left vertebral:  Injection reveals the presence of a widely patent vertebral artery. This leads to a widely patent basilar artery that terminates in bilateral P1. The basilar apex is normal. There is no significant stenosis, occlusion, aneurysm, or vascular malformation visualized. The parenchymal and venous phases are normal. The venous sinuses are widely patent.  Right vertebral:  Normal vessel. No PICA aneurysm. See basilar description above.  Right internal carotid artery, head (pre-embolization):  Injection reveals the presence of a widely patent ICA that leads to a patent ACA and MCA. The ICA aneurysm is again visualized as described in the diagnostic portion of the procedure.  Right internal carotid artery, head (during embolization):  Balloon catheter position is seen across the neck of the aneurysm. Coil mass is also seen within the aneurysm, however a  portion of the coil mass appears to extend more inferiorly, and may be extraluminal. No contrast filling of the actual aneurysm is seen. No contrast extravasation is seen.  Right internal carotid artery, head (immediate post-embolization):  Injection reveals the presence of a widely patent ICA that leads to a patent ACA and MCA. Coil mass within the aneurysm is stable, without coil prolapse or filling defect to suggest thrombus. No aneurysm filling is seen. The distal right anterior cerebral artery is widely patent, and flash filling across the communicating artery of the left ACA is also seen.  Right internal carotid artery, head (final control):  Injection reveals the presence of a widely patent ICA that leads to a patent ACA and MCA. No thrombus is visualized. Coil mass is seen within the aneurysm  without any aneurysm filling seen. The parenchymal phase does not demonstrate any perfusion deficits. Venous phase is unremarkable.  DISPOSITION:  After completion of the procedure, the femoral sheath was replaced with a closure device which was deployed successfully.  The patient was kept intubated and transferred to the neuro critical care unit for further care.  IMPRESSION:  1. 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 No aneurysm filling is seen post-embolization.   Electronically Signed   By: Consuella Lose   On: 09/12/2014 19:52   Ir Neuro Each Add'l After Basic Uni Right (ms)  09/12/2014   PROCEDURE: 1. DIAGNOSTIC CEREBRAL ANGIOGRAM  2. COIL EMBOLIZATION OF RIGHT ANTERIOR CEREBRAL ARTERY ANEURYSM  OPERATORS:  Dr. Consuella Lose, MD.  HISTORY:  The patient is a 63 year old woman who presented to the emergency department with altered mental status. CT scan demonstrated diffuse subarachnoid hemorrhage, and CTA demonstrated the possibility of an anterior communicating artery aneurysm which may be amenable to coil embolization. The patient now presents for  diagnostic cerebral angiogram with possible coil embolization.  APPROACH:  The technical aspects of the procedure as well as its potential risks and benefits were reviewed with the patient's family. These risks included but were not limited to bleeding, infection, allergic reaction, damage to organs/vital structures, stroke, inability to repair vessel/place stent, delayed aneurysm rupture, and catastrophic outcomes of heart attack, coma, and death. With the understanding of these risks, informed consent was obtained and witnessed.  The patient was placed in the supine position on the angiography table and the skin of the right groin was prepped in the usual sterile fashion. The procedure was performed under general anesthesia provided by the anesthesiology department.  A 5 French sheath was introduced in the right femoral artery using Seldinger technique.  HEPARIN: 0 Units  FLUOROSCOPY TIME: 37.6 combined AP and Lateral minutes.  CONTRAST: 100cc Omnipaque 300  CATHETERS AND WIRES:  5-French JB-1 glide catheter  180 cm 0.035" glidewire  6-French 90cm Shuttle sheath  6-French Shuttle Select JB-1 catheter  0.072" Navien guidecatheter  150 cm Marksman microcatheter  Synchro 2 standard microwire  Excelsior SL-10 45 degree microcatheter  67mm x 20mm TransForm [place] balloon catheter  COILS USED (MRI compatible):  3.63mm x 8cm Target 360 Ultra  9mm x 6cm Target 360 Ultra  VESSELS CATHETERIZED:  Right common femoral artery  Right common carotid artery  Right internal carotid artery  Right vertebral artery  Left vertebral artery  Left common carotid artery  Right middle cerebral artery, M3 segment  Right anterior cerebral artery, A3 segment  VESSELS STUDIED:  Right common carotid: neck: AP and lateral  Right internal carotid: head: AP, lateral, obliques  Right vertebral: AP, lateral and trans-facial  Left common carotid: neck: AP and lateral  Left common carotid: head: AP, lateral, obliques  Left vertebral: AP, lateral  Right  internal carotid artery, head: magnified obliques (pre-embolization)  Right internal carotid artery, head: magnified obliques (during coiling)  Right internal carotid artery, head: magnified obliques (immediate post-embolization)  Right internal carotid artery, head: AP, lateral (final control)  PROCEDURAL NARRATIVE:  A 5-Fr JB-1 terumo glide catheter was then advanced over a 0.035 glidewire into the aortic arch and the innominate and right common carotid artery followed by the internal carotid artery were selected. Cervical and cerebral angiography were performed. The JB-1 was then withdrawn into the innominate artery and the right subclavian artery followed by the right vertebral artery were selected. Cerebral angiography was performed.  The JB-1 was then withdrawn into the aortic arch and the left common carotid artery was selected. Cervical and cerebral angiography were performed. The JB-1 catheter was then withdrawn into the aortic arch and the left subclavian artery was selected followed by the left vertebral artery. Cerebral angiography was performed. The JB-1 catheter was removed without incident  Under real-time fluoroscopy, the shuttle select catheter was advanced into the right common carotid artery over an 0.035" guidewire. The guide sheath was then advanced into the right common carotid artery. After roadmap was taken, the sheath was advanced into the proximal internal carotid artery over the support of the select catheter and guidewire.The select catheter was removed and a 072 Navien guidecatheter was coaxially introduced over the Marksman microcatheter and microwire. Pre-embolization angiography was performed.  Under roadmap guidance, the Baltazar Apo was advanced over a Synchro 2 standard microwire into the M3 segment of the superior division. The Navien catheter was then tracked over the [facility] to its final position in the horizontal cavernous internal carotid artery. The Marksman was then removed and  the coiling catheter and balloon catheter were introduced over the microwire in a biaxial fashion. The balloon catheter was then navigated into the A1 A2 junction, across the neck of the aneurysm over the support of a microwire which was positioned in the A3 segment. The coiling catheter was then navigated into the aneurysm lumen over the microwire. The wire was then removed. During placement of the framing coil, a portion of the coil appear to be extraluminal. There were no changes in vital signs noted at this point. The balloon catheter was then inflated, and 20 mg of protamine was immediately administered by anesthesia. This coil was then deployed. The balloon was then deflated, and angiogram was taken which did not demonstrate any contrast extravasation. The second above coil was then deployed.  Cerebral angiography was performed immediately post embolization. The coiling and balloon catheters were then removed and the Navien withdrawn into the cervical internal carotid artery.Final control angiography was then performed from the Navien guide catheter.  The Navien catheter and sheath were then synchronously removed without incident. The 8-Fr sheath was removed and exchanged for a 7 [place] ExoSeal closure device which was deployed successfully.  INTERPRETATION:  Right femoral artery:  The right femoral artery is unremarkable, arterial sheath in adequate position.  Right common carotid: neck:  There is no significant stenosis, occlusion, aneurysm or plaque visualized on this injection.  Right internal carotid: head:  Injection reveals the presence of a widely patent ICA, M1, and A1 segments and their branches. There is an irregularly shaped inferiorly and leftward projecting aneurysm arising from the right A1 A2 junction. Flash filling across the anterior communicating artery of the contralateral A2 segment is seen. This aneurysm measures approximately 4.2 x 3.8 mm. It appears to have a relatively narrow neck. The  parenchymal and venous phases are normal. The venous sinuses are widely patent.  Left common carotid: neck:  There is no significant stenosis, occlusion, aneurysm or plaque visualized on this injection. 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  Left common carotid: head:  Injection reveals the presence of a widely patent ICA, A1, and M1 segments and their branches. There is no significant stenosis, occlusion, aneurysm, or high flow vascular malformation visualized. Filling of the anterior communicating artery aneurysm is not seen on this left-sided injection. The parenchymal and venous phases are normal. The venous sinuses are widely patent.  Left vertebral:  Injection reveals the presence of a widely patent vertebral artery. This leads to a widely patent basilar artery that terminates in bilateral P1. The basilar apex is normal. There is no significant stenosis, occlusion, aneurysm, or vascular malformation visualized. The parenchymal and venous phases are normal. The venous sinuses are widely patent.  Right vertebral:  Normal vessel. No PICA aneurysm. See basilar description above.  Right internal carotid artery, head (pre-embolization):  Injection reveals the presence of a widely patent ICA that leads to a patent ACA and MCA. The ICA aneurysm is again visualized as described in the diagnostic portion of the procedure.  Right internal carotid artery, head (during embolization):  Balloon catheter position is seen across the neck of the aneurysm. Coil mass is also seen within the aneurysm, however a portion of the coil mass appears to extend more inferiorly, and may be extraluminal. No contrast filling of the actual aneurysm is seen. No contrast extravasation is seen.  Right internal carotid artery, head (immediate post-embolization):  Injection reveals the presence of a widely patent ICA that leads to a patent ACA and MCA. Coil mass within the aneurysm is stable, without coil  prolapse or filling defect to suggest thrombus. No aneurysm filling is seen. The distal right anterior cerebral artery is widely patent, and flash filling across the communicating artery of the left ACA is also seen.  Right internal carotid artery, head (final control):  Injection reveals the presence of a widely patent ICA that leads to a patent ACA and MCA. No thrombus is visualized. Coil mass is seen within the aneurysm without any aneurysm filling seen. The parenchymal phase does not demonstrate any perfusion deficits. Venous phase is unremarkable.  DISPOSITION:  After completion of the procedure, the femoral sheath was replaced with a closure device which was deployed successfully.  The patient was kept intubated and transferred to the neuro critical care unit for further care.  IMPRESSION:  1. 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 No aneurysm filling is seen post-embolization.   Electronically Signed   By: Consuella Lose   On: 09/12/2014 19:52     EKG Interpretation None      MDM   Final diagnoses:  Subarachnoid hemorrhage    Patient presented after seizure and unresponsiveness. Focal neurologic deficits. Neurology immediately consult in. Head CT done and showed subarachnoid hemorrhage. CTA also done immediately and showed aneurysm. Neurosurgery consult in. They will admit the patient. Taken to interventional for embolization. During the patient's visit the mental status improved somewhat. She was moving both sides after the CT. Still minimally following commands. Still had moderate confusion. Troponin mildly elevated, likely due to intracranial hemorrhage. EKG reassuring but does have LVH. Initial troponin elevated, but likely due to hemolysis. Repeat was not elevated  CRITICAL CARE Performed by: Mackie Pai. Total critical care time: 30 Critical care time was exclusive of separately billable procedures and treating other  patients. Critical care was necessary to treat or prevent imminent or life-threatening deterioration. Critical care was time spent personally by me on the following activities: development of treatment plan with patient and/or surrogate as well as nursing, discussions with consultants, evaluation of patient's response to treatment, examination of patient, obtaining history from patient or surrogate, ordering and performing treatments and interventions, ordering and review of laboratory studies, ordering and review of radiographic studies, pulse oximetry and re-evaluation of patient's condition.    Jasper Riling. Alvino Chapel, MD 09/13/14 0020

## 2014-09-13 NOTE — Progress Notes (Signed)
[facility] Progress Note Patient Name: Mindy Mills DOB: 1951-01-20 MRN: [id number]   Date of Service  09/13/2014  HPI/Events of Note  Arterial line, not flushing, not working, attempted redressing and reposition, still not working  eICU Interventions  Arterial line discontinued        Marcos Ruelas 09/13/2014, 5:09 AM

## 2014-09-13 NOTE — Progress Notes (Signed)
Pt seen and examined. Pt self-extubated yesterday. Has been somewhat agitated overnight.  EXAM: Temp:  [97.4 F (36.3 C)-98.7 F (37.1 C)] 97.4 F (36.3 C) (09/17 0020) Pulse Rate:  [69-109] 101 (09/17 0730) Resp:  [18-31] 29 (09/17 0730) BP: (99-179)/(53-112) 139/72 mmHg (09/17 0600) SpO2:  [77 %-100 %] 100 % (09/17 0730) Arterial Line BP: (99-174)/(58-120) 141/110 mmHg (09/17 0500) FiO2 (%):  [40 %-100 %] 100 % (09/17 0730) Intake/Output     09/16 0701 - 09/17 0700 09/17 0701 - 09/18 0700   I.V. (mL/kg) 1495.5 (21.8)    IV Piggyback 200    Total Intake(mL/kg) 1695.5 (24.8)    Urine (mL/kg/hr) 2950 (1.8)    Total Output 2950     Net -1254.5          Stool Occurrence 2 x     Awake, alert, oriented to person, hospital, says year is 2007 Tracks to both sides Moves all extremities with 5/5 strength  LABS: Lab Results  Component Value Date   CREATININE 0.45* 09/12/2014   BUN 11 09/12/2014   NA 142 09/12/2014   K 3.2* 09/12/2014   CL 105 09/12/2014   CO2 21 09/12/2014   Lab Results  Component Value Date   WBC 21.3* 09/12/2014   HGB 12.1 09/12/2014   HCT 36.1 09/12/2014   MCV 90.9 09/12/2014   PLT 218 09/12/2014    IMAGING: TTE demonstrates EF 81-82%, diastolic dysfunction with moderate pulmonary HTN  TCD shows normal flow velocities  IMPRESSION: - 63 y.o. female SAHd#3 s/p Acom coiling, neurologically stable - Hypoxia and tachycardia may be related to diastolic dysfunction with fluid overload and/or narcotic withdrawl  PLAN: - Cont close neurologic observation and spasm prophylaxis with Nimotop/Zocor. - Cont MWF TCD - Diuresis per PCCM. May require re-intubation if she remains in distress - Will likely require anxiolytic, possibly low-dose precedex gtt - Cardiology c/s

## 2014-09-13 NOTE — Progress Notes (Signed)
Diuresed well with lasix Tolerated Bipap x 1 hr but then got agitated again Discussed with cards - no interventions  With persistent agitation & tachycardia, decision made to intubate Intubated uneventfully Will sedate with - fent gtt & precedex (if tolerated hemodynamically) FU CXR & ABG Will place OGT & start TFs  Addn cc time x 30 mins, independent of procedures.  Rigoberto Noel MD

## 2014-09-13 NOTE — Progress Notes (Signed)
D/w Dr Shon Hale in radiolog   NG tube stick in GE junction, prox stomach area - suspects large hiatal hernia   Plan  - ok to give nimotop solution - low risk for complication  - otherwise maintain NPO  - repeat ABd Port in AM 09/14/14 and if tube not migrated - call GI consult for pulling down panda   Dr. Brand Males, M.D., [facility].C.P Pulmonary and Critical Care Medicine Staff Physician [facility] Pager: [phone], If no answer or between  15:00h - 7:00h: call [phone]  09/13/2014 9:31 PM

## 2014-09-13 NOTE — Progress Notes (Addendum)
PULMONARY / CRITICAL CARE MEDICINE   Name: Mindy Mills MRN: [id number] DOB: 1951-07-02    ADMISSION DATE:  09/11/2014 CONSULTATION DATE:  09/13/2014  REFERRING MD :  Kathyrn Sheriff  CHIEF COMPLAINT:  Seizure  INITIAL PRESENTATION: 63 y.o. F brought to [facility] ED on 9/15 after a witnessed syncopal episode followed by a grand mal seizure while in the car.  Upon ED arrival, pt was unresponsive and not moving her right side.  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  She was intubated and underwent endovascular procedure by Dr. Leighton Ruff.  PCCM was consulted. PMH - hepc, smoker, chronic headaches on narcotics  STUDIES:  CTA Head 9/15 >>> large SAH, 2.5 x 70mm right ACA aneurysm as source of SAH. Echo 9/16 >>EF 25-30%, apical akinesis- LAD infarction pattern.Moderate diastolic dysfunction .  RVSP 50  SIGNIFICANT EVENTS: 9/15 - admitted with [facility] and right ACA aneurysm, underwent endovascular repair, returned to ICU on vent. 9/15 - A com aneurysm coiled   SUBJECTIVE: Awake & alert resp distress this am afebrile Good UO  VITAL SIGNS: Temp:  [97.4 F (36.3 C)-98.7 F (37.1 C)] 97.4 F (36.3 C) (09/17 0020) Pulse Rate:  [69-140] 140 (09/17 0900) Resp:  [17-31] 21 (09/17 0900) BP: (99-179)/(53-120) 161/97 mmHg (09/17 0900) SpO2:  [75 %-100 %] 79 % (09/17 0900) Arterial Line BP: (99-174)/(58-120) 141/110 mmHg (09/17 0500) FiO2 (%):  [40 %-100 %] 100 % (09/17 0730) HEMODYNAMICS:   VENTILATOR SETTINGS: Vent Mode:  [-] PSV;CPAP FiO2 (%):  [40 %-100 %] 100 % PEEP:  [5 cmH20] 5 cmH20 Pressure Support:  [5 cmH20] 5 cmH20 INTAKE / OUTPUT: Intake/Output     09/16 0701 - 09/17 0700 09/17 0701 - 09/18 0700   I.V. (mL/kg) 1545.5 (22.6) 100 (1.5)   Other  125   IV Piggyback 200    Total Intake(mL/kg) 1745.5 (25.5) 225 (3.3)   Urine (mL/kg/hr) 2950 (1.8)    Total Output 2950     Net -1204.5 +225        Stool Occurrence 2 x      PHYSICAL EXAMINATION: General: Chronically ill  appearing elderly female,  intubated, in NAD. Neuro: awake, alert, mild agitated, keeps taking O2 mask off, non-focal, RASS 0 to +2 HEENT: [place]/AT. PERRL, sclerae anicteric. Cardiovascular: RRR, no M/R/G.  Lungs: Respirations even and unlabored. BL diffuse rhonchi Abdomen: BS x 4, soft, NT/ND.  Musculoskeletal: No gross deformities, no edema.  Skin: Intact, warm, no rashes.  LABS:  CBC  Recent Labs Lab 09/11/14 1304  09/11/14 1729 09/11/14 2030 09/12/14 2300  WBC 23.4*  --   --  17.8* 21.3*  HGB 14.3  < > 13.3 13.3 12.1  HCT 43.3  < > 39.0 39.8 36.1  PLT 315  --   --  301 218  < > = values in this interval not displayed. Coag's  Recent Labs Lab 09/11/14 2030  APTT 26  INR 1.04   BMET  Recent Labs Lab 09/11/14 1304 09/11/14 1325  09/11/14 1729 09/12/14 0450 09/12/14 2300  NA 139 140  < > 141 144 142  K 3.1* 3.0*  < > 2.9* 4.5 3.2*  CL 102 104  --   --  110 105  CO2 21  --   --   --  19 21  BUN 13 13  --   --  10 11  CREATININE 0.62 0.70  --   --  0.56 0.45*  GLUCOSE 249* 249*  --  197* 162* 139*  < > =  values in this interval not displayed. Electrolytes  Recent Labs Lab 09/11/14 1304 09/12/14 0450 09/12/14 2300  CALCIUM 8.5 7.5* 8.5  MG  --  1.6  --   PHOS  --  2.2*  --    Sepsis Markers No results found for this basename: LATICACIDVEN, PROCALCITON, O2SATVEN,  in the last 168 hours  ABG  Recent Labs Lab 09/11/14 2040 09/12/14 0459 09/12/14 1306  PHART 7.211* 7.303* 7.415  PCO2ART 47.5* 40.1 32.6*  PO2ART 87.0 96.8 49.0*    Liver Enzymes  Recent Labs Lab 09/11/14 1304  AST 16  ALT 10  ALKPHOS 77  BILITOT 0.3  ALBUMIN 3.5   Cardiac Enzymes  Recent Labs Lab 09/11/14 2030 09/12/14 0215 09/12/14 0455 09/13/14 0524  TROPONINI 5.10* 5.40* 4.43* 1.62*  PROBNP 3137.0*  --   --   --     Glucose No results found for this basename: GLUCAP,  in the last 168 hours  Imaging CXR - BL ASD -edema pattern  ASSESSMENT /  PLAN:  PULMONARY OETT 9/15 >>>9/16 A: VDRF - in setting of acute SAH secondary to right ACA aneurysm Likely COPD Acute pulmonary edema P:   Bipap DuoNebs Lasix stat - if fails, proceed with intubation  NEUROLOGIC A:   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 Precedex gtt Keppra per neuro recs.  CARDIOVASCULAR A:  Hx HTN Incomplete LBBB on EKG, nstemi Acute Pulmonary edema on CXR , high BNP NSTEMI - LAD pattern WMA on echo P:  Cards consult Lasix now & q 12h, dc IVFs  RENAL A:   Hypokalemia Mild metabolic acidosis- resolved P:  Replace electrolytes as indicated.  GASTROINTESTINAL A:   Hx HCV P:   SUP: Pantoprazole. Npo for now  HEMATOLOGIC A:   VTE Prophylaxis P:  SCD's only. CBC in AM.  INFECTIOUS A:  Aspiration pneumonia  P:   Add ceftx/ clinda  ENDOCRINE A:   Hyperglycemia  P:   CBG's q4hr. SSI.    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  CRITICAL CARE: The patient is critically ill with multiple organ systems failure and requires high complexity decision making for assessment and support, frequent evaluation and titration of therapies, application of advanced monitoring technologies and extensive interpretation of multiple databases. Critical Care Time devoted to patient care services described in this note is 60 minutes.   Kara Mead MD. [facility] Flood. [place] [facility] Pager [phone] If no response call [phone]    09/13/2014, 9:19 AM

## 2014-09-13 NOTE — Procedures (Signed)
Central Venous Catheter Insertion Procedure Note Shiquita Collignon [id number] 1951/04/16  Procedure: Insertion of Central Venous Catheter Indications: Assessment of intravascular volume, Drug and/or fluid administration and Frequent blood sampling  Procedure Details Consent: Risks of procedure as well as the alternatives and risks of each were explained to the (patient/caregiver).  Consent for procedure obtained. Time Out: Verified patient identification, verified procedure, site/side was marked, verified correct patient position, special equipment/implants available, medications/allergies/relevent history reviewed, required imaging and test results available.  Performed  Maximum sterile technique was used including antiseptics, cap, gloves, gown, hand hygiene, mask and sheet. Skin prep: Chlorhexidine; local anesthetic administered A antimicrobial bonded/coated triple lumen catheter was placed in the right subclavian vein using the Seldinger technique.  Evaluation Blood flow good Complications: No apparent complications Patient did tolerate procedure well. Chest X-ray ordered to verify placement.  CXR: pending.  Maritssa Haughton,PETE 09/13/2014, 12:29 PM

## 2014-09-13 NOTE — Consult Note (Signed)
CARDIOLOGY CONSULT  Aliah Eriksson    [id number] [birth year]  Reason for Consult: NSTEMI  Requesting Physician: Elsworth Soho  Primary Cardiologist: new  HPI:  Ms Chipley is a 63 y.o. F who was admitted on 09/11/14 after a witnessed syncopal episode followed by a grand mal seizure while in the car. Upon ED arrival, pt was unresponsive and not moving her right side. 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  She was intubated and underwent coil embolization of the AHA aneurysm by Dr. Leighton Ruff. She has ruled in for a NSTEMI with peak troponin 5.4. ECG has shown poor anterior R wave progression and echo reveals EF 25- 30% with LAD infarct pattern and grade 2 diastolic dysfunction. BNP was elevated suggesting acute systolic and diastolic dysfunction at 5993.    Past Medical History  Diagnosis Date  . Hypertension   . Migraine   . Cancer     Cervicle  . Hepatitis C    Past Surgical History  Procedure Laterality Date  . Abdominal hysterectomy      FAMHx: No family history on file.  SOCHx:  reports that she has been smoking Cigarettes.  She has been smoking about 0.50 packs per day. She does not have any smokeless tobacco history on file. She reports that she drinks alcohol. She reports that she uses illicit drugs (Marijuana).  ALLERGIES: Allergies  Allergen Reactions  . Bee Venom Anaphylaxis and Hives    ROS:  A comprehensive review of systems was negative except for: COPD, tobacco history, h/o migraines, cervicle cancer, hepatitis C.  HOME MEDICATIONS: Prescriptions prior to admission  Medication Sig Dispense Refill  . butorphanol (STADOL) 10 MG/ML nasal spray Place 1 spray into the nose every 4 (four) hours as needed for headache or migraine.      . Cholecalciferol 2000 UNITS CAPS Take 2,000 Units by mouth daily.      Marland Kitchen EPINEPHrine (EPIPEN) 0.3 mg/0.3 mL IJ SOAJ injection Inject 0.3 mg into the muscle once as needed (for allergic  reaction).      Marland Kitchen FLUoxetine (PROZAC) 20 MG capsule Take 80 mg by mouth daily. For depression / anxiety      . HYDROcodone-acetaminophen (NORCO) 10-325 MG per tablet Take 1-2 tablets by mouth every 4 (four) hours as needed for moderate pain.      . meloxicam (MOBIC) 15 MG tablet Take 15 mg by mouth daily as needed (for joint pain).      . nortriptyline (PAMELOR) 10 MG capsule Take 10 mg by mouth daily.      Marland Kitchen oxyCODONE (ROXICODONE) 15 MG immediate release tablet Take 15 mg by mouth 3 (three) times daily as needed (for severe / breakthrough pain).      . promethazine (PHENERGAN) 25 MG tablet Take 50 mg by mouth 2 (two) times daily as needed for nausea or vomiting.      . traZODone (DESYREL) 50 MG tablet Take 25 mg by mouth at bedtime as needed (for sleep / anxiety).      . verapamil (CALAN-SR) 180 MG CR tablet Take 180 mg by mouth daily. For migraines        HOSPITAL MEDICATIONS: . antiseptic oral rinse  7 mL Mouth Rinse QID  . budesonide  0.5 mg Nebulization BID  . cefTRIAXone (ROCEPHIN)  IV  1 g Intravenous Q24H  . chlorhexidine  15 mL Mouth Rinse BID  . docusate sodium  100 mg Oral BID  .  fentaNYL      . furosemide      . haloperidol lactate  5 mg Intravenous Once  . ipratropium-albuterol  3 mL Nebulization Q4H  . levETIRAcetam  500 mg Intravenous Q12H  . multivitamin with minerals  1 tablet Oral Daily  . niMODipine  60 mg Oral Q4H   Or  . NiMODipine  60 mg Per Tube Q4H  . pantoprazole (PROTONIX) IV  40 mg Intravenous QHS  . potassium chloride  10 mEq Intravenous Q1 Hr x 2  . senna  1 tablet Oral BID  . senna-docusate  1 tablet Oral BID  . simvastatin  80 mg Oral QHS   . dexmedetomidine 0.3 mcg/kg/hr (09/13/14 1023)   VITALS: Blood pressure 161/97, pulse 140, temperature 97.2 F (36.2 C), temperature source Axillary, resp. rate 21, height _0  (1.6 m), weight 151 lb 0.2 oz (68.5 kg), SpO2 79.00%.   Physical Exam BP 161/97  Pulse 140  Temp(Src) 97.2 F (36.2 C) (Axillary)   Resp 21  Ht _1  (1.6 m)  Wt 151 lb 0.2 oz (68.5 kg)  BMI 26.76 kg/m2  SpO2 79% General: Chronically ill appearing elderly female, sedated; now on BiPAP Skin: normal turgor, multiple tattoos HEENT: Normocephalic, atraumatic. Pupils equal round and reactive to light; sclera anicteric; extraocular muscles intact;  Neck: No JVD, no carotid bruits; normal carotid upstroke Lungs:Coarse BS diffusely; diffuse rhonchi Chest wall: without tenderness to palpitation Heart: PMI not displaced, RRR, s1 s2 normal, 1/6 systolic murmur, no diastolic murmur, no rubs, gallops, thrills, or heaves Abdomen: soft, nontender; no hepatosplenomehaly, BS+; abdominal aorta nontender and not dilated by palpation. Pulses 2+ Musculoskeletal: full range of motion, normal strength, no joint deformities Extremities: no clubbing cyanosis or edema, Homan's sign negative     09/11/14 ECG: Normal sinus rhythm Left anterior fascicular block Anteroseptal infarct , age undetermined Prolonged QT - QT interval is markedly prolonged.  ECG now 09/13/14: NSR at 93 with short PR, LVH Diffuse T wave inversion with markedly prolonged QTc at 668   Echo Study Conclusions:  - Left ventricle: The cavity size was normal. Wall thickness was increased in a pattern of mild LVH. Systolic function was severely reduced. The estimated ejection fraction was in the range of 25% to 30%. Mid to apical anterior/anteroseptal/inferoseptal/inferior akinesis. Apical lateral akinesis. Akinesis of the apex. Features are consistent with a pseudonormal left ventricular filling pattern, with concomitant abnormal relaxation and increased filling pressure (grade 2 diastolic dysfunction). - Aortic valve: There was no stenosis. - Mitral valve: Moderately calcified annulus. Mildly calcified leaflets . There was trivial regurgitation. - Right ventricle: The cavity size was normal. Systolic function was normal. - Tricuspid valve: Peak RV-RA gradient  (S): 47 mm Hg. - Pulmonary arteries: PA peak pressure: 50 mm Hg (S). - Inferior vena cava: The vessel was normal in size. The respirophasic diameter changes were in the normal range (= 50%), consistent with normal central venous pressure.  Impressions:  - Normal LV size with mild LV hypertrophy. EF 25-30% with wall motion abnormalities as noted above in LAD infarction pattern. Moderate diastolic dysfunction . Normal RV size and systolic function. No significant valvular abnormalities. Moderate pulmonary hypertension.    LABS: Results for orders placed during the hospital encounter of 09/11/14 (from the past 48 hour(s))  I-STAT TROPOININ, ED     Status: Abnormal   Collection Time    09/11/14 12:51 PM      Result Value Ref Range   Troponin i, poc 0.31 (*)  0.00 - 0.08 ng/mL   Comment NOTIFIED PHYSICIAN     Comment 3            Comment: Due to the release kinetics of cTnI,     a negative result within the first hours     of the onset of symptoms does not rule out     myocardial infarction with certainty.     If myocardial infarction is still suspected,     repeat the test at appropriate intervals.  I-STAT CHEM 8, ED     Status: Abnormal   Collection Time    09/11/14 12:53 PM      Result Value Ref Range   Sodium 135 (*) 137 - 147 mEq/L   Potassium 7.5 (*) 3.7 - 5.3 mEq/L   Chloride 105  96 - 112 mEq/L   BUN 20  6 - 23 mg/dL   Creatinine, Ser 0.80  0.50 - 1.10 mg/dL   Glucose, Bld 258 (*) 70 - 99 mg/dL   Calcium, Ion 0.96 (*) 1.13 - 1.30 mmol/L   TCO2 24  0 - 100 mmol/L   Hemoglobin 16.3 (*) 12.0 - 15.0 g/dL   HCT 48.0 (*) 36.0 - 46.0 %   Comment NOTIFIED PHYSICIAN    CBC WITH DIFFERENTIAL     Status: Abnormal   Collection Time    09/11/14  1:04 PM      Result Value Ref Range   WBC 23.4 (*) 4.0 - 10.5 K/uL   RBC 4.71  3.87 - 5.11 MIL/uL   Hemoglobin 14.3  12.0 - 15.0 g/dL   HCT 43.3  36.0 - 46.0 %   MCV 91.9  78.0 - 100.0 fL   MCH 30.4  26.0 - 34.0 pg   MCHC 33.0   30.0 - 36.0 g/dL   RDW 13.5  11.5 - 15.5 %   Platelets 315  150 - 400 K/uL   Neutrophils Relative % 77  43 - 77 %   Neutro Abs 17.9 (*) 1.7 - 7.7 K/uL   Lymphocytes Relative 17  12 - 46 %   Lymphs Abs 3.9  0.7 - 4.0 K/uL   Monocytes Relative 5  3 - 12 %   Monocytes Absolute 1.2 (*) 0.1 - 1.0 K/uL   Eosinophils Relative 1  0 - 5 %   Eosinophils Absolute 0.3  0.0 - 0.7 K/uL   Basophils Relative 0  0 - 1 %   Basophils Absolute 0.1  0.0 - 0.1 K/uL  COMPREHENSIVE METABOLIC PANEL     Status: Abnormal   Collection Time    09/11/14  1:04 PM      Result Value Ref Range   Sodium 139  137 - 147 mEq/L   Potassium 3.1 (*) 3.7 - 5.3 mEq/L   Chloride 102  96 - 112 mEq/L   CO2 21  19 - 32 mEq/L   Glucose, Bld 249 (*) 70 - 99 mg/dL   BUN 13  6 - 23 mg/dL   Creatinine, Ser 0.62  0.50 - 1.10 mg/dL   Calcium 8.5  8.4 - 10.5 mg/dL   Total Protein 6.5  6.0 - 8.3 g/dL   Albumin 3.5  3.5 - 5.2 g/dL   AST 16  0 - 37 U/L   ALT 10  0 - 35 U/L   Alkaline Phosphatase 77  39 - 117 U/L   Total Bilirubin 0.3  0.3 - 1.2 mg/dL   GFR calc non Af Amer >90  >90  mL/min   GFR calc Af Amer >90  >90 mL/min   Comment: (NOTE)     The eGFR has been calculated using the CKD EPI equation.     This calculation has not been validated in all clinical situations.     eGFR's persistently <90 mL/min signify possible Chronic Kidney     Disease.   Anion gap 16 (*) 5 - 15  ETHANOL     Status: None   Collection Time    09/11/14  1:04 PM      Result Value Ref Range   Alcohol, Ethyl (B) <11  0 - 11 mg/dL   Comment:            LOWEST DETECTABLE LIMIT FOR     SERUM ALCOHOL IS 11 mg/dL     FOR MEDICAL PURPOSES ONLY  I-STAT CHEM 8, ED     Status: Abnormal   Collection Time    09/11/14  1:25 PM      Result Value Ref Range   Sodium 140  137 - 147 mEq/L   Potassium 3.0 (*) 3.7 - 5.3 mEq/L   Chloride 104  96 - 112 mEq/L   BUN 13  6 - 23 mg/dL   Creatinine, Ser 0.70  0.50 - 1.10 mg/dL   Glucose, Bld 249 (*) 70 - 99 mg/dL    Calcium, Ion 1.06 (*) 1.13 - 1.30 mmol/L   TCO2 21  0 - 100 mmol/L   Hemoglobin 15.3 (*) 12.0 - 15.0 g/dL   HCT 45.0  36.0 - 46.0 %  POCT I-STAT 7, (LYTES, BLD GAS, ICA,H+H)     Status: Abnormal   Collection Time    09/11/14  5:20 PM      Result Value Ref Range   pH, Arterial 7.248 (*) 7.350 - 7.450   pCO2 arterial 50.0 (*) 35.0 - 45.0 mmHg   pO2, Arterial 144.0 (*) 80.0 - 100.0 mmHg   Bicarbonate 22.1  20.0 - 24.0 mEq/L   TCO2 24  0 - 100 mmol/L   O2 Saturation 99.0     Acid-base deficit 6.0 (*) 0.0 - 2.0 mmol/L   Sodium 141  137 - 147 mEq/L   Potassium 2.8 (*) 3.7 - 5.3 mEq/L   Calcium, Ion 1.11 (*) 1.13 - 1.30 mmol/L   HCT 39.0  36.0 - 46.0 %   Hemoglobin 13.3  12.0 - 15.0 g/dL   Patient temperature 36.0 C     Sample type ARTERIAL    POCT I-STAT 4, (NA,K, GLUC, HGB,HCT)     Status: Abnormal   Collection Time    09/11/14  5:29 PM      Result Value Ref Range   Sodium 141  137 - 147 mEq/L   Potassium 2.9 (*) 3.7 - 5.3 mEq/L   Glucose, Bld 197 (*) 70 - 99 mg/dL   HCT 39.0  36.0 - 46.0 %   Hemoglobin 13.3  12.0 - 15.0 g/dL  MRSA PCR SCREENING     Status: None   Collection Time    09/11/14  7:44 PM      Result Value Ref Range   MRSA by PCR NEGATIVE  NEGATIVE   Comment:            The GeneXpert MRSA Assay (FDA     approved for NASAL specimens     only), is one component of a     comprehensive MRSA colonization     surveillance program. It is not  intended to diagnose MRSA     infection nor to guide or     monitor treatment for     MRSA infections.  TROPONIN I     Status: Abnormal   Collection Time    09/11/14  8:30 PM      Result Value Ref Range   Troponin I 5.10 (*) <0.30 ng/mL   Comment:            Due to the release kinetics of cTnI,     a negative result within the first hours     of the onset of symptoms does not rule out     myocardial infarction with certainty.     If myocardial infarction is still suspected,     repeat the test at appropriate intervals.      CRITICAL RESULT CALLED TO, READ BACK BY AND VERIFIED WITH:     C CRAVER,RN [id number] [address] [place]  PRO B NATRIURETIC PEPTIDE     Status: Abnormal   Collection Time    09/11/14  8:30 PM      Result Value Ref Range   Pro B Natriuretic peptide (BNP) 3137.0 (*) 0 - 125 pg/mL  PROTIME-INR     Status: None   Collection Time    09/11/14  8:30 PM      Result Value Ref Range   Prothrombin Time 13.6  11.6 - 15.2 seconds   INR 1.04  0.00 - 1.49  APTT     Status: None   Collection Time    09/11/14  8:30 PM      Result Value Ref Range   aPTT 26  24 - 37 seconds  CBC     Status: Abnormal   Collection Time    09/11/14  8:30 PM      Result Value Ref Range   WBC 17.8 (*) 4.0 - 10.5 K/uL   RBC 4.40  3.87 - 5.11 MIL/uL   Hemoglobin 13.3  12.0 - 15.0 g/dL   HCT 39.8  36.0 - 46.0 %   MCV 90.5  78.0 - 100.0 fL   MCH 30.2  26.0 - 34.0 pg   MCHC 33.4  30.0 - 36.0 g/dL   RDW 13.8  11.5 - 15.5 %   Platelets 301  150 - 400 K/uL  POCT I-STAT 3, ART BLOOD GAS (G3+)     Status: Abnormal   Collection Time    09/11/14  8:40 PM      Result Value Ref Range   pH, Arterial 7.211 (*) 7.350 - 7.450   pCO2 arterial 47.5 (*) 35.0 - 45.0 mmHg   pO2, Arterial 87.0  80.0 - 100.0 mmHg   Bicarbonate 19.1 (*) 20.0 - 24.0 mEq/L   TCO2 21  0 - 100 mmol/L   O2 Saturation 94.0     Acid-base deficit 9.0 (*) 0.0 - 2.0 mmol/L   Patient temperature 97.9 F     Collection site RADIAL, ALLEN'S TEST ACCEPTABLE     Drawn by RT     Sample type ARTERIAL    TROPONIN I     Status: Abnormal   Collection Time    09/12/14  2:15 AM      Result Value Ref Range   Troponin I 5.40 (*) <0.30 ng/mL   Comment:            Due to the release kinetics of cTnI,     a negative result within the first hours     of  the onset of symptoms does not rule out     myocardial infarction with certainty.     If myocardial infarction is still suspected,     repeat the test at appropriate intervals.     CRITICAL VALUE NOTED.  VALUE IS CONSISTENT  WITH PREVIOUSLY REPORTED AND CALLED VALUE.  BASIC METABOLIC PANEL     Status: Abnormal   Collection Time    09/12/14  4:50 AM      Result Value Ref Range   Sodium 144  137 - 147 mEq/L   Potassium 4.5  3.7 - 5.3 mEq/L   Comment: DELTA CHECK NOTED     NO VISIBLE HEMOLYSIS   Chloride 110  96 - 112 mEq/L   CO2 19  19 - 32 mEq/L   Glucose, Bld 162 (*) 70 - 99 mg/dL   BUN 10  6 - 23 mg/dL   Creatinine, Ser 0.56  0.50 - 1.10 mg/dL   Calcium 7.5 (*) 8.4 - 10.5 mg/dL   GFR calc non Af Amer >90  >90 mL/min   GFR calc Af Amer >90  >90 mL/min   Comment: (NOTE)     The eGFR has been calculated using the CKD EPI equation.     This calculation has not been validated in all clinical situations.     eGFR's persistently <90 mL/min signify possible Chronic Kidney     Disease.   Anion gap 15  5 - 15  MAGNESIUM     Status: None   Collection Time    09/12/14  4:50 AM      Result Value Ref Range   Magnesium 1.6  1.5 - 2.5 mg/dL  PHOSPHORUS     Status: Abnormal   Collection Time    09/12/14  4:50 AM      Result Value Ref Range   Phosphorus 2.2 (*) 2.3 - 4.6 mg/dL  TROPONIN I     Status: Abnormal   Collection Time    09/12/14  4:55 AM      Result Value Ref Range   Troponin I 4.43 (*) <0.30 ng/mL   Comment:            Due to the release kinetics of cTnI,     a negative result within the first hours     of the onset of symptoms does not rule out     myocardial infarction with certainty.     If myocardial infarction is still suspected,     repeat the test at appropriate intervals.     CRITICAL VALUE NOTED.  VALUE IS CONSISTENT WITH PREVIOUSLY REPORTED AND CALLED VALUE.  BLOOD GAS, ARTERIAL     Status: Abnormal   Collection Time    09/12/14  4:59 AM      Result Value Ref Range   FIO2 0.60     Delivery systems VENTILATOR     Mode PRESSURE REGULATED VOLUME CONTROL     VT 420     Rate 16     Peep/cpap 5.0     pH, Arterial 7.303 (*) 7.350 - 7.450   pCO2 arterial 40.1  35.0 - 45.0 mmHg   pO2,  Arterial 96.8  80.0 - 100.0 mmHg   Bicarbonate 19.0 (*) 20.0 - 24.0 mEq/L   TCO2 20.2  0 - 100 mmol/L   Acid-base deficit 6.0 (*) 0.0 - 2.0 mmol/L   O2 Saturation 96.5     Patient temperature 100.1     Collection site ARTERIAL LINE  Drawn by [phone]     Sample type ARTERIAL DRAW     Allens test (pass/fail) PASS  PASS  POCT I-STAT 3, ART BLOOD GAS (G3+)     Status: Abnormal   Collection Time    09/12/14  1:06 PM      Result Value Ref Range   pH, Arterial 7.415  7.350 - 7.450   pCO2 arterial 32.6 (*) 35.0 - 45.0 mmHg   pO2, Arterial 49.0 (*) 80.0 - 100.0 mmHg   Bicarbonate 20.9  20.0 - 24.0 mEq/L   TCO2 22  0 - 100 mmol/L   O2 Saturation 85.0     Acid-base deficit 3.0 (*) 0.0 - 2.0 mmol/L   Patient temperature 98.6 F     Collection site ARTERIAL LINE     Drawn by RT     Sample type ARTERIAL    CBC     Status: Abnormal   Collection Time    09/12/14 11:00 PM      Result Value Ref Range   WBC 21.3 (*) 4.0 - 10.5 K/uL   RBC 3.97  3.87 - 5.11 MIL/uL   Hemoglobin 12.1  12.0 - 15.0 g/dL   HCT 36.1  36.0 - 46.0 %   MCV 90.9  78.0 - 100.0 fL   MCH 30.5  26.0 - 34.0 pg   MCHC 33.5  30.0 - 36.0 g/dL   RDW 14.1  11.5 - 15.5 %   Platelets 218  150 - 400 K/uL   Comment: DELTA CHECK NOTED     REPEATED TO VERIFY  BASIC METABOLIC PANEL     Status: Abnormal   Collection Time    09/12/14 11:00 PM      Result Value Ref Range   Sodium 142  137 - 147 mEq/L   Potassium 3.2 (*) 3.7 - 5.3 mEq/L   Comment: DELTA CHECK NOTED   Chloride 105  96 - 112 mEq/L   CO2 21  19 - 32 mEq/L   Glucose, Bld 139 (*) 70 - 99 mg/dL   BUN 11  6 - 23 mg/dL   Creatinine, Ser 0.45 (*) 0.50 - 1.10 mg/dL   Calcium 8.5  8.4 - 10.5 mg/dL   GFR calc non Af Amer >90  >90 mL/min   GFR calc Af Amer >90  >90 mL/min   Comment: (NOTE)     The eGFR has been calculated using the CKD EPI equation.     This calculation has not been validated in all clinical situations.     eGFR's persistently <90 mL/min signify possible  Chronic Kidney     Disease.   Anion gap 16 (*) 5 - 15  TROPONIN I     Status: Abnormal   Collection Time    09/13/14  5:24 AM      Result Value Ref Range   Troponin I 1.62 (*) <0.30 ng/mL   Comment:            Due to the release kinetics of cTnI,     a negative result within the first hours     of the onset of symptoms does not rule out     myocardial infarction with certainty.     If myocardial infarction is still suspected,     repeat the test at appropriate intervals.     CRITICAL VALUE NOTED.  VALUE IS CONSISTENT WITH PREVIOUSLY REPORTED AND CALLED VALUE.    IMAGING: Ct Angio Head W/cm &/or Wo Cm  09/11/2014  CLINICAL DATA:  Code stroke.  Left-sided deficit.  EXAM: CT ANGIOGRAPHY HEAD  TECHNIQUE: Multidetector CT imaging of the head was performed using the standard protocol during bolus administration of intravenous contrast. Multiplanar CT image reconstructions and MIPs were obtained to evaluate the vascular anatomy.  CONTRAST:  50 mL MultiHance IV  COMPARISON:  None.  FINDINGS: Large subarachnoid hemorrhage. Diffuse bilateral subarachnoid hemorrhage is present in the sylvian fissures and in the basilar cisterns. There is clot in the suprasellar cistern extending into the interhemispheric fissure. The largest area of clot is in the region of the anterior communicating artery.  Ventricles are not enlarged. No ventricular hemorrhage. There is calcified choroid in the fourth ventricle.  Negative for acute ischemic infarct. No shift of the midline structures.  Patient is intubated.  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 This has an irregular morphology measures 2.5 x 4 mm. The aneurysm projects inferiorly and is the source of subarachnoid hemorrhage. No other aneurysm  No significant vasospasm.  Both vertebral arteries are patent to the basilar. PICA patent bilaterally. Basilar widely patent. Superior cerebellar arteries are widely patent  Internal carotid artery  patent bilaterally. Mild atherosclerotic disease in the cavernous carotid bilaterally. Anterior and middle cerebral arteries are patent bilaterally.  Review of the MIP images confirms the above findings.  IMPRESSION: Large subarachnoid hemorrhage. 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 No significant vasospasm.  3D imaging was generated and saved to PACS.  Ventricle size is normal.  No acute ischemic infarct.  Critical Value/emergent results were called by telephone at the time of interpretation on 09/11/2014 at 1:16 pm to Dr. Davonna Belling , who verbally acknowledged these results.   Electronically Signed   By: Franchot Gallo M.D.   On: 09/11/2014 13:24   Ct Head Wo Contrast  09/12/2014   CLINICAL DATA:  63 year old female with subarachnoid hemorrhage post coiling of anterior communicating artery aneurysm.  EXAM: CT HEAD WITHOUT CONTRAST  TECHNIQUE: Contiguous axial images were obtained from the base of the skull through the vertex without intravenous contrast.  COMPARISON:  09/11/2014.  FINDINGS: Diffuse subarachnoid hemorrhage with clot seen adjacent to the coiled anterior communicating artery aneurysm.  Intraventricular blood now noted. The ventricular size may be minimally more prominent than on the prior examination and close continued surveillance recommended to help determine if the patient develops hydrocephalus.  No large thrombotic infarct currently detected.  No intracranial mass lesion noted on this unenhanced exam.  IMPRESSION: Diffuse subarachnoid hemorrhage with clot seen adjacent to the coiled anterior communicating artery aneurysm.  Intraventricular blood now noted. The ventricular size may be minimally more prominent than on the prior examination and close continued surveillance recommended to help determine if the patient develops hydrocephalus.   Electronically Signed   By: Chauncey Cruel M.D.   On: 09/12/2014 06:24    Ir Transcath/emboliz  09/12/2014   PROCEDURE: 1. DIAGNOSTIC CEREBRAL ANGIOGRAM  2. COIL EMBOLIZATION OF RIGHT ANTERIOR CEREBRAL ARTERY ANEURYSM  OPERATORS:  Dr. Consuella Lose, MD.  HISTORY:  The patient is a 63 year old woman who presented to the emergency department with altered mental status. CT scan demonstrated diffuse subarachnoid hemorrhage, and CTA demonstrated the possibility of an anterior communicating artery aneurysm which may be amenable to coil embolization. The patient now presents for diagnostic cerebral angiogram with possible coil embolization.  APPROACH:  The technical aspects of the procedure as well as its potential risks and benefits were  reviewed with the patient's family. These risks included but were not limited to bleeding, infection, allergic reaction, damage to organs/vital structures, stroke, inability to repair vessel/place stent, delayed aneurysm rupture, and catastrophic outcomes of heart attack, coma, and death. With the understanding of these risks, informed consent was obtained and witnessed.  The patient was placed in the supine position on the angiography table and the skin of the right groin was prepped in the usual sterile fashion. The procedure was performed under general anesthesia provided by the anesthesiology department.  A 5 French sheath was introduced in the right femoral artery using Seldinger technique.  HEPARIN: 0 Units  FLUOROSCOPY TIME: 37.6 combined AP and Lateral minutes.  CONTRAST: 100cc Omnipaque 300  CATHETERS AND WIRES:  5-French JB-1 glide catheter  180 cm 0.035" glidewire  6-French 90cm Shuttle sheath  6-French Shuttle Select JB-1 catheter  0.072" Navien guidecatheter  150 cm Marksman microcatheter  Synchro 2 standard microwire  Excelsior SL-10 45 degree microcatheter  40m x 561mTransForm [place] balloon catheter  COILS USED (MRI compatible):  3.6m56m 8cm Target 360 Ultra  3mm36m6cm Target 360 Ultra  VESSELS CATHETERIZED:  Right common femoral artery   Right common carotid artery  Right internal carotid artery  Right vertebral artery  Left vertebral artery  Left common carotid artery  Right middle cerebral artery, M3 segment  Right anterior cerebral artery, A3 segment  VESSELS STUDIED:  Right common carotid: neck: AP and lateral  Right internal carotid: head: AP, lateral, obliques  Right vertebral: AP, lateral and trans-facial  Left common carotid: neck: AP and lateral  Left common carotid: head: AP, lateral, obliques  Left vertebral: AP, lateral  Right internal carotid artery, head: magnified obliques (pre-embolization)  Right internal carotid artery, head: magnified obliques (during coiling)  Right internal carotid artery, head: magnified obliques (immediate post-embolization)  Right internal carotid artery, head: AP, lateral (final control)  PROCEDURAL NARRATIVE:  A 5-Fr JB-1 terumo glide catheter was then advanced over a 0.035 glidewire into the aortic arch and the innominate and right common carotid artery followed by the internal carotid artery were selected. Cervical and cerebral angiography were performed. The JB-1 was then withdrawn into the innominate artery and the right subclavian artery followed by the right vertebral artery were selected. Cerebral angiography was performed. The JB-1 was then withdrawn into the aortic arch and the left common carotid artery was selected. Cervical and cerebral angiography were performed. The JB-1 catheter was then withdrawn into the aortic arch and the left subclavian artery was selected followed by the left vertebral artery. Cerebral angiography was performed. The JB-1 catheter was removed without incident  Under real-time fluoroscopy, the shuttle select catheter was advanced into the right common carotid artery over an 0.035" guidewire. The guide sheath was then advanced into the right common carotid artery. After roadmap was taken, the sheath was advanced into the proximal internal carotid artery over the support of  the select catheter and guidewire.The select catheter was removed and a 072 Navien guidecatheter was coaxially introduced over the Marksman microcatheter and microwire. Pre-embolization angiography was performed.  Under roadmap guidance, the MarkBaltazar Apo advanced over a Synchro 2 standard microwire into the M3 segment of the superior division. The Navien catheter was then tracked over the [facility] final position in the horizontal cavernous internal carotid artery. The Marksman was then removed and the coiling catheter and balloon catheter were introduced over the microwire in a biaxial fashion. The balloon catheter was then navigated  into the A1 A2 junction, across the neck of the aneurysm over the support of a microwire which was positioned in the A3 segment. The coiling catheter was then navigated into the aneurysm lumen over the microwire. The wire was then removed. During placement of the framing coil, a portion of the coil appear to be extraluminal. There were no changes in vital signs noted at this point. The balloon catheter was then inflated, and 20 mg of protamine was immediately administered by anesthesia. This coil was then deployed. The balloon was then deflated, and angiogram was taken which did not demonstrate any contrast extravasation. The second above coil was then deployed.  Cerebral angiography was performed immediately post embolization. The coiling and balloon catheters were then removed and the Navien withdrawn into the cervical internal carotid artery.Final control angiography was then performed from the Navien guide catheter.  The Navien catheter and sheath were then synchronously removed without incident. The 8-Fr sheath was removed and exchanged for a 7 [place] ExoSeal closure device which was deployed successfully.  INTERPRETATION:  Right femoral artery:  The right femoral artery is unremarkable, arterial sheath in adequate position.  Right common carotid: neck:  There is no  significant stenosis, occlusion, aneurysm or plaque visualized on this injection.  Right internal carotid: head:  Injection reveals the presence of a widely patent ICA, M1, and A1 segments and their branches. There is an irregularly shaped inferiorly and leftward projecting aneurysm arising from the right A1 A2 junction. Flash filling across the anterior communicating artery of the contralateral A2 segment is seen. This aneurysm measures approximately 4.2 x 3.8 mm. It appears to have a relatively narrow neck. The parenchymal and venous phases are normal. The venous sinuses are widely patent.  Left common carotid: neck:  There is no significant stenosis, occlusion, aneurysm or plaque visualized on this injection. 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  Left common carotid: head:  Injection reveals the presence of a widely patent ICA, A1, and M1 segments and their branches. There is no significant stenosis, occlusion, aneurysm, or high flow vascular malformation visualized. Filling of the anterior communicating artery aneurysm is not seen on this left-sided injection. The parenchymal and venous phases are normal. The venous sinuses are widely patent.  Left vertebral:  Injection reveals the presence of a widely patent vertebral artery. This leads to a widely patent basilar artery that terminates in bilateral P1. The basilar apex is normal. There is no significant stenosis, occlusion, aneurysm, or vascular malformation visualized. The parenchymal and venous phases are normal. The venous sinuses are widely patent.  Right vertebral:  Normal vessel. No PICA aneurysm. See basilar description above.  Right internal carotid artery, head (pre-embolization):  Injection reveals the presence of a widely patent ICA that leads to a patent ACA and MCA. The ICA aneurysm is again visualized as described in the diagnostic portion of the procedure.  Right internal carotid artery, head (during  embolization):  Balloon catheter position is seen across the neck of the aneurysm. Coil mass is also seen within the aneurysm, however a portion of the coil mass appears to extend more inferiorly, and may be extraluminal. No contrast filling of the actual aneurysm is seen. No contrast extravasation is seen.  Right internal carotid artery, head (immediate post-embolization):  Injection reveals the presence of a widely patent ICA that leads to a patent ACA and MCA. Coil mass within the aneurysm is stable, without coil prolapse or filling defect  to suggest thrombus. No aneurysm filling is seen. The distal right anterior cerebral artery is widely patent, and flash filling across the communicating artery of the left ACA is also seen.  Right internal carotid artery, head (final control):  Injection reveals the presence of a widely patent ICA that leads to a patent ACA and MCA. No thrombus is visualized. Coil mass is seen within the aneurysm without any aneurysm filling seen. The parenchymal phase does not demonstrate any perfusion deficits. Venous phase is unremarkable.  DISPOSITION:  After completion of the procedure, the femoral sheath was replaced with a closure device which was deployed successfully.  The patient was kept intubated and transferred to the neuro critical care unit for further care.  IMPRESSION:  1. 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 No aneurysm filling is seen post-embolization.   Electronically Signed   By: Consuella Lose   On: 09/12/2014 19:52   Ir Angiogram Follow Up Study  09/12/2014   PROCEDURE: 1. DIAGNOSTIC CEREBRAL ANGIOGRAM  2. COIL EMBOLIZATION OF RIGHT ANTERIOR CEREBRAL ARTERY ANEURYSM  OPERATORS:  Dr. Consuella Lose, MD.  HISTORY:  The patient is a 63 year old woman who presented to the emergency department with altered mental status. CT scan demonstrated diffuse subarachnoid hemorrhage, and CTA demonstrated the possibility of  an anterior communicating artery aneurysm which may be amenable to coil embolization. The patient now presents for diagnostic cerebral angiogram with possible coil embolization.  APPROACH:  The technical aspects of the procedure as well as its potential risks and benefits were reviewed with the patient's family. These risks included but were not limited to bleeding, infection, allergic reaction, damage to organs/vital structures, stroke, inability to repair vessel/place stent, delayed aneurysm rupture, and catastrophic outcomes of heart attack, coma, and death. With the understanding of these risks, informed consent was obtained and witnessed.  The patient was placed in the supine position on the angiography table and the skin of the right groin was prepped in the usual sterile fashion. The procedure was performed under general anesthesia provided by the anesthesiology department.  A 5 French sheath was introduced in the right femoral artery using Seldinger technique.  HEPARIN: 0 Units  FLUOROSCOPY TIME: 37.6 combined AP and Lateral minutes.  CONTRAST: 100cc Omnipaque 300  CATHETERS AND WIRES:  5-French JB-1 glide catheter  180 cm 0.035" glidewire  6-French 90cm Shuttle sheath  6-French Shuttle Select JB-1 catheter  0.072" Navien guidecatheter  150 cm Marksman microcatheter  Synchro 2 standard microwire  Excelsior SL-10 45 degree microcatheter  6m x 570mTransForm [place] balloon catheter  COILS USED (MRI compatible):  3.77m74m 8cm Target 360 Ultra  3mm46m6cm Target 360 Ultra  VESSELS CATHETERIZED:  Right common femoral artery  Right common carotid artery  Right internal carotid artery  Right vertebral artery  Left vertebral artery  Left common carotid artery  Right middle cerebral artery, M3 segment  Right anterior cerebral artery, A3 segment  VESSELS STUDIED:  Right common carotid: neck: AP and lateral  Right internal carotid: head: AP, lateral, obliques  Right vertebral: AP, lateral and trans-facial  Left common  carotid: neck: AP and lateral  Left common carotid: head: AP, lateral, obliques  Left vertebral: AP, lateral  Right internal carotid artery, head: magnified obliques (pre-embolization)  Right internal carotid artery, head: magnified obliques (during coiling)  Right internal carotid artery, head: magnified obliques (immediate post-embolization)  Right internal carotid artery, head: AP, lateral (final control)  PROCEDURAL NARRATIVE:  A  5-Fr JB-1 terumo glide catheter was then advanced over a 0.035 glidewire into the aortic arch and the innominate and right common carotid artery followed by the internal carotid artery were selected. Cervical and cerebral angiography were performed. The JB-1 was then withdrawn into the innominate artery and the right subclavian artery followed by the right vertebral artery were selected. Cerebral angiography was performed. The JB-1 was then withdrawn into the aortic arch and the left common carotid artery was selected. Cervical and cerebral angiography were performed. The JB-1 catheter was then withdrawn into the aortic arch and the left subclavian artery was selected followed by the left vertebral artery. Cerebral angiography was performed. The JB-1 catheter was removed without incident  Under real-time fluoroscopy, the shuttle select catheter was advanced into the right common carotid artery over an 0.035" guidewire. The guide sheath was then advanced into the right common carotid artery. After roadmap was taken, the sheath was advanced into the proximal internal carotid artery over the support of the select catheter and guidewire.The select catheter was removed and a 072 Navien guidecatheter was coaxially introduced over the Marksman microcatheter and microwire. Pre-embolization angiography was performed.  Under roadmap guidance, the Baltazar Apo was advanced over a Synchro 2 standard microwire into the M3 segment of the superior division. The Navien catheter was then tracked over the  [facility] to its final position in the horizontal cavernous internal carotid artery. The Marksman was then removed and the coiling catheter and balloon catheter were introduced over the microwire in a biaxial fashion. The balloon catheter was then navigated into the A1 A2 junction, across the neck of the aneurysm over the support of a microwire which was positioned in the A3 segment. The coiling catheter was then navigated into the aneurysm lumen over the microwire. The wire was then removed. During placement of the framing coil, a portion of the coil appear to be extraluminal. There were no changes in vital signs noted at this point. The balloon catheter was then inflated, and 20 mg of protamine was immediately administered by anesthesia. This coil was then deployed. The balloon was then deflated, and angiogram was taken which did not demonstrate any contrast extravasation. The second above coil was then deployed.  Cerebral angiography was performed immediately post embolization. The coiling and balloon catheters were then removed and the Navien withdrawn into the cervical internal carotid artery.Final control angiography was then performed from the Navien guide catheter.  The Navien catheter and sheath were then synchronously removed without incident. The 8-Fr sheath was removed and exchanged for a 7 [place] ExoSeal closure device which was deployed successfully.  INTERPRETATION:  Right femoral artery:  The right femoral artery is unremarkable, arterial sheath in adequate position.  Right common carotid: neck:  There is no significant stenosis, occlusion, aneurysm or plaque visualized on this injection.  Right internal carotid: head:  Injection reveals the presence of a widely patent ICA, M1, and A1 segments and their branches. There is an irregularly shaped inferiorly and leftward projecting aneurysm arising from the right A1 A2 junction. Flash filling across the anterior communicating artery of the contralateral  A2 segment is seen. This aneurysm measures approximately 4.2 x 3.8 mm. It appears to have a relatively narrow neck. The parenchymal and venous phases are normal. The venous sinuses are widely patent.  Left common carotid: neck:  There is no significant stenosis, occlusion, aneurysm or plaque visualized on this injection. 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  Left common carotid: head:  Injection reveals the presence of a widely patent ICA, A1, and M1 segments and their branches. There is no significant stenosis, occlusion, aneurysm, or high flow vascular malformation visualized. Filling of the anterior communicating artery aneurysm is not seen on this left-sided injection. The parenchymal and venous phases are normal. The venous sinuses are widely patent.  Left vertebral:  Injection reveals the presence of a widely patent vertebral artery. This leads to a widely patent basilar artery that terminates in bilateral P1. The basilar apex is normal. There is no significant stenosis, occlusion, aneurysm, or vascular malformation visualized. The parenchymal and venous phases are normal. The venous sinuses are widely patent.  Right vertebral:  Normal vessel. No PICA aneurysm. See basilar description above.  Right internal carotid artery, head (pre-embolization):  Injection reveals the presence of a widely patent ICA that leads to a patent ACA and MCA. The ICA aneurysm is again visualized as described in the diagnostic portion of the procedure.  Right internal carotid artery, head (during embolization):  Balloon catheter position is seen across the neck of the aneurysm. Coil mass is also seen within the aneurysm, however a portion of the coil mass appears to extend more inferiorly, and may be extraluminal. No contrast filling of the actual aneurysm is seen. No contrast extravasation is seen.  Right internal carotid artery, head (immediate post-embolization):  Injection reveals the  presence of a widely patent ICA that leads to a patent ACA and MCA. Coil mass within the aneurysm is stable, without coil prolapse or filling defect to suggest thrombus. No aneurysm filling is seen. The distal right anterior cerebral artery is widely patent, and flash filling across the communicating artery of the left ACA is also seen.  Right internal carotid artery, head (final control):  Injection reveals the presence of a widely patent ICA that leads to a patent ACA and MCA. No thrombus is visualized. Coil mass is seen within the aneurysm without any aneurysm filling seen. The parenchymal phase does not demonstrate any perfusion deficits. Venous phase is unremarkable.  DISPOSITION:  After completion of the procedure, the femoral sheath was replaced with a closure device which was deployed successfully.  The patient was kept intubated and transferred to the neuro critical care unit for further care.  IMPRESSION:  1. 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 No aneurysm filling is seen post-embolization.   Electronically Signed   By: Consuella Lose   On: 09/12/2014 19:52   Ir Angiogram Follow Up Study  09/12/2014   PROCEDURE: 1. DIAGNOSTIC CEREBRAL ANGIOGRAM  2. COIL EMBOLIZATION OF RIGHT ANTERIOR CEREBRAL ARTERY ANEURYSM  OPERATORS:  Dr. Consuella Lose, MD.  HISTORY:  The patient is a 63 year old woman who presented to the emergency department with altered mental status. CT scan demonstrated diffuse subarachnoid hemorrhage, and CTA demonstrated the possibility of an anterior communicating artery aneurysm which may be amenable to coil embolization. The patient now presents for diagnostic cerebral angiogram with possible coil embolization.  APPROACH:  The technical aspects of the procedure as well as its potential risks and benefits were reviewed with the patient's family. These risks included but were not limited to bleeding, infection, allergic reaction,  damage to organs/vital structures, stroke, inability to repair vessel/place stent, delayed aneurysm rupture, and catastrophic outcomes of heart attack, coma, and death. With the understanding of these risks, informed consent was obtained and witnessed.  The patient was placed in the supine position on the angiography table  and the skin of the right groin was prepped in the usual sterile fashion. The procedure was performed under general anesthesia provided by the anesthesiology department.  A 5 French sheath was introduced in the right femoral artery using Seldinger technique.  HEPARIN: 0 Units  FLUOROSCOPY TIME: 37.6 combined AP and Lateral minutes.  CONTRAST: 100cc Omnipaque 300  CATHETERS AND WIRES:  5-French JB-1 glide catheter  180 cm 0.035" glidewire  6-French 90cm Shuttle sheath  6-French Shuttle Select JB-1 catheter  0.072" Navien guidecatheter  150 cm Marksman microcatheter  Synchro 2 standard microwire  Excelsior SL-10 45 degree microcatheter  63m x 5650mTransForm [place] balloon catheter  COILS USED (MRI compatible):  3.50m50m 8cm Target 360 Ultra  3mm850m6cm Target 360 Ultra  VESSELS CATHETERIZED:  Right common femoral artery  Right common carotid artery  Right internal carotid artery  Right vertebral artery  Left vertebral artery  Left common carotid artery  Right middle cerebral artery, M3 segment  Right anterior cerebral artery, A3 segment  VESSELS STUDIED:  Right common carotid: neck: AP and lateral  Right internal carotid: head: AP, lateral, obliques  Right vertebral: AP, lateral and trans-facial  Left common carotid: neck: AP and lateral  Left common carotid: head: AP, lateral, obliques  Left vertebral: AP, lateral  Right internal carotid artery, head: magnified obliques (pre-embolization)  Right internal carotid artery, head: magnified obliques (during coiling)  Right internal carotid artery, head: magnified obliques (immediate post-embolization)  Right internal carotid artery, head: AP, lateral (final  control)  PROCEDURAL NARRATIVE:  A 5-Fr JB-1 terumo glide catheter was then advanced over a 0.035 glidewire into the aortic arch and the innominate and right common carotid artery followed by the internal carotid artery were selected. Cervical and cerebral angiography were performed. The JB-1 was then withdrawn into the innominate artery and the right subclavian artery followed by the right vertebral artery were selected. Cerebral angiography was performed. The JB-1 was then withdrawn into the aortic arch and the left common carotid artery was selected. Cervical and cerebral angiography were performed. The JB-1 catheter was then withdrawn into the aortic arch and the left subclavian artery was selected followed by the left vertebral artery. Cerebral angiography was performed. The JB-1 catheter was removed without incident  Under real-time fluoroscopy, the shuttle select catheter was advanced into the right common carotid artery over an 0.035" guidewire. The guide sheath was then advanced into the right common carotid artery. After roadmap was taken, the sheath was advanced into the proximal internal carotid artery over the support of the select catheter and guidewire.The select catheter was removed and a 072 Navien guidecatheter was coaxially introduced over the Marksman microcatheter and microwire. Pre-embolization angiography was performed.  Under roadmap guidance, the MarkBaltazar Apo advanced over a Synchro 2 standard microwire into the M3 segment of the superior division. The Navien catheter was then tracked over the [facility] final position in the horizontal cavernous internal carotid artery. The Marksman was then removed and the coiling catheter and balloon catheter were introduced over the microwire in a biaxial fashion. The balloon catheter was then navigated into the A1 A2 junction, across the neck of the aneurysm over the support of a microwire which was positioned in the A3 segment. The coiling  catheter was then navigated into the aneurysm lumen over the microwire. The wire was then removed. During placement of the framing coil, a portion of the coil appear to be extraluminal. There were no changes in vital signs  noted at this point. The balloon catheter was then inflated, and 20 mg of protamine was immediately administered by anesthesia. This coil was then deployed. The balloon was then deflated, and angiogram was taken which did not demonstrate any contrast extravasation. The second above coil was then deployed.  Cerebral angiography was performed immediately post embolization. The coiling and balloon catheters were then removed and the Navien withdrawn into the cervical internal carotid artery.Final control angiography was then performed from the Navien guide catheter.  The Navien catheter and sheath were then synchronously removed without incident. The 8-Fr sheath was removed and exchanged for a 7 [place] ExoSeal closure device which was deployed successfully.  INTERPRETATION:  Right femoral artery:  The right femoral artery is unremarkable, arterial sheath in adequate position.  Right common carotid: neck:  There is no significant stenosis, occlusion, aneurysm or plaque visualized on this injection.  Right internal carotid: head:  Injection reveals the presence of a widely patent ICA, M1, and A1 segments and their branches. There is an irregularly shaped inferiorly and leftward projecting aneurysm arising from the right A1 A2 junction. Flash filling across the anterior communicating artery of the contralateral A2 segment is seen. This aneurysm measures approximately 4.2 x 3.8 mm. It appears to have a relatively narrow neck. The parenchymal and venous phases are normal. The venous sinuses are widely patent.  Left common carotid: neck:  There is no significant stenosis, occlusion, aneurysm or plaque visualized on this injection. Note is made of a pharyngeal loop in the mid cervical internal carotid  artery.  Left common carotid: head:  Injection reveals the presence of a widely patent ICA, A1, and M1 segments and their branches. There is no significant stenosis, occlusion, aneurysm, or high flow vascular malformation visualized. Filling of the anterior communicating artery aneurysm is not seen on this left-sided injection. The parenchymal and venous phases are normal. The venous sinuses are widely patent.  Left vertebral:  Injection reveals the presence of a widely patent vertebral artery. This leads to a widely patent basilar artery that terminates in bilateral P1. The basilar apex is normal. There is no significant stenosis, occlusion, aneurysm, or vascular malformation visualized. The parenchymal and venous phases are normal. The venous sinuses are widely patent.  Right vertebral:  Normal vessel. No PICA aneurysm. See basilar description above.  Right internal carotid artery, head (pre-embolization):  Injection reveals the presence of a widely patent ICA that leads to a patent ACA and MCA. The ICA aneurysm is again visualized as described in the diagnostic portion of the procedure.  Right internal carotid artery, head (during embolization):  Balloon catheter position is seen across the neck of the aneurysm. Coil mass is also seen within the aneurysm, however a portion of the coil mass appears to extend more inferiorly, and may be extraluminal. No contrast filling of the actual aneurysm is seen. No contrast extravasation is seen.  Right internal carotid artery, head (immediate post-embolization):  Injection reveals the presence of a widely patent ICA that leads to a patent ACA and MCA. Coil mass within the aneurysm is stable, without coil prolapse or filling defect to suggest thrombus. No aneurysm filling is seen. The distal right anterior cerebral artery is widely patent, and flash filling across the communicating artery of the left ACA is also seen.  Right internal carotid artery, head (final control):   Injection reveals the presence of a widely patent ICA that leads to a patent ACA and MCA. No thrombus is visualized. Coil mass is  seen within the aneurysm without any aneurysm filling seen. The parenchymal phase does not demonstrate any perfusion deficits. Venous phase is unremarkable.  DISPOSITION:  After completion of the procedure, the femoral sheath was replaced with a closure device which was deployed successfully.  The patient was kept intubated and transferred to the neuro critical care unit for further care.  IMPRESSION:  1. 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 No aneurysm filling is seen post-embolization.   Electronically Signed   By: Consuella Lose   On: 09/12/2014 19:52   Dg Chest Port 1 View  09/13/2014   CLINICAL DATA:  Pneumonitis  EXAM: PORTABLE CHEST - 1 VIEW  COMPARISON:  09/12/2014  FINDINGS: Interval extubation.  Multifocal patchy airspace opacities bilaterally, in a perihilar distribution, favored to reflect moderate interstitial edema. This appears to have mildly progressed from the recent prior. Suspected small left pleural effusion. No pneumothorax.  The heart is top-normal in size.  IMPRESSION: Interval extubation.  Multifocal patchy airspace opacities, favored to reflect moderate interstitial edema, mildly progressed.  Suspected small left pleural effusion.   Electronically Signed   By: Julian Hy M.D.   On: 09/13/2014 08:01   Dg Chest Port 1 View  09/12/2014   CLINICAL DATA:  Evaluate endotracheal tube position  EXAM: PORTABLE CHEST - 1 VIEW  COMPARISON:  Portable chest x-ray of 09/11/2014  FINDINGS: There is little change in probable pulmonary vascular congestion with effusions. The heart is mildly enlarged and stable. The tip of the endotracheal tube is approximately 5.4 cm above the carina. NG tube remains.  IMPRESSION: 1. Moderate pulmonary vascular congestion with effusions. 2. Tip of endotracheal tube approximately  5.4 cm above the carina.   Electronically Signed   By: Ivar Drape M.D.   On: 09/12/2014 08:19   Dg Chest Port 1 View  09/11/2014   CLINICAL DATA:  Check endotracheal tube position.  EXAM: PORTABLE CHEST - 1 VIEW  COMPARISON:  09/11/2014 at 1332 hr.  FINDINGS: Her radiographs at 1939 hr.  Endotracheal tube is in satisfactory position. The distal tip is at the level of the clavicular heads, and 6.5 cm above the carina. Nasogastric tube courses into the stomach and below the edge of the image. Pulmonary vascular congestion and mild pulmonary edema pattern is unchanged. Underlying emphysematous changes suspected. Negative for pneumothorax. No visible pleural effusion.  IMPRESSION: Satisfactory position of endotracheal tube. No significant change in mild pulmonary edema and vascular congestion. Suspect underlying emphysema.   Electronically Signed   By: Curlene Dolphin M.D.   On: 09/11/2014 20:07   Dg Chest Port 1 View  09/11/2014   CLINICAL DATA:  Altered mental status.  EXAM: PORTABLE CHEST - 1 VIEW  COMPARISON:  None.  FINDINGS: The patient is slightly rotated to the right. Cardiac silhouette is within normal limits for size. Mild thoracic aortic calcification is noted. Multiple monitoring leads overlie the chest, predominantly on the right. There is pulmonary vascular congestion with interstitial densities bilaterally, greatest in the lung bases. No pleural effusion is identified, although the left lateral costophrenic angle is incompletely imaged. No pneumothorax is seen. No acute osseous abnormality is identified.  IMPRESSION: Pulmonary vascular congestion and bilateral lung opacities compatible with mild pulmonary edema.   Electronically Signed   By: Logan Bores   On: 09/11/2014 13:43   Dg Abd Portable 1v  09/11/2014   CLINICAL DATA:  Orogastric tube placement  EXAM: PORTABLE ABDOMEN - 1 VIEW  COMPARISON:  None.  FINDINGS: The tip of the gastric tube is in the fundus of the stomach. Iodinated contrast is  present in the collecting system of the kidneys and bladder. No disproportionate dilatation of bowel. Stomach is decompressed.  IMPRESSION: NG tube tip is in the fundus of the stomach. Stomach is decompressed.   Electronically Signed   By: Maryclare Bean M.D.   On: 09/11/2014 20:24   Ir Angio Intra Extracran Sel Internal Carotid Uni R Mod Sed  09/12/2014   PROCEDURE: 1. DIAGNOSTIC CEREBRAL ANGIOGRAM  2. COIL EMBOLIZATION OF RIGHT ANTERIOR CEREBRAL ARTERY ANEURYSM  OPERATORS:  Dr. Consuella Lose, MD.  HISTORY:  The patient is a 63 year old woman who presented to the emergency department with altered mental status. CT scan demonstrated diffuse subarachnoid hemorrhage, and CTA demonstrated the possibility of an anterior communicating artery aneurysm which may be amenable to coil embolization. The patient now presents for diagnostic cerebral angiogram with possible coil embolization.  APPROACH:  The technical aspects of the procedure as well as its potential risks and benefits were reviewed with the patient's family. These risks included but were not limited to bleeding, infection, allergic reaction, damage to organs/vital structures, stroke, inability to repair vessel/place stent, delayed aneurysm rupture, and catastrophic outcomes of heart attack, coma, and death. With the understanding of these risks, informed consent was obtained and witnessed.  The patient was placed in the supine position on the angiography table and the skin of the right groin was prepped in the usual sterile fashion. The procedure was performed under general anesthesia provided by the anesthesiology department.  A 5 French sheath was introduced in the right femoral artery using Seldinger technique.  HEPARIN: 0 Units  FLUOROSCOPY TIME: 37.6 combined AP and Lateral minutes.  CONTRAST: 100cc Omnipaque 300  CATHETERS AND WIRES:  5-French JB-1 glide catheter  180 cm 0.035" glidewire  6-French 90cm Shuttle sheath  6-French Shuttle Select JB-1  catheter  0.072" Navien guidecatheter  150 cm Marksman microcatheter  Synchro 2 standard microwire  Excelsior SL-10 45 degree microcatheter  73m x 551mTransForm [place] balloon catheter  COILS USED (MRI compatible):  3.45m72m 8cm Target 360 Ultra  3mm61m6cm Target 360 Ultra  VESSELS CATHETERIZED:  Right common femoral artery  Right common carotid artery  Right internal carotid artery  Right vertebral artery  Left vertebral artery  Left common carotid artery  Right middle cerebral artery, M3 segment  Right anterior cerebral artery, A3 segment  VESSELS STUDIED:  Right common carotid: neck: AP and lateral  Right internal carotid: head: AP, lateral, obliques  Right vertebral: AP, lateral and trans-facial  Left common carotid: neck: AP and lateral  Left common carotid: head: AP, lateral, obliques  Left vertebral: AP, lateral  Right internal carotid artery, head: magnified obliques (pre-embolization)  Right internal carotid artery, head: magnified obliques (during coiling)  Right internal carotid artery, head: magnified obliques (immediate post-embolization)  Right internal carotid artery, head: AP, lateral (final control)  PROCEDURAL NARRATIVE:  A 5-Fr JB-1 terumo glide catheter was then advanced over a 0.035 glidewire into the aortic arch and the innominate and right common carotid artery followed by the internal carotid artery were selected. Cervical and cerebral angiography were performed. The JB-1 was then withdrawn into the innominate artery and the right subclavian artery followed by the right vertebral artery were selected. Cerebral angiography was performed. The JB-1 was then withdrawn into the aortic arch and the left common carotid artery was selected. Cervical and cerebral angiography were performed.  The JB-1 catheter was then withdrawn into the aortic arch and the left subclavian artery was selected followed by the left vertebral artery. Cerebral angiography was performed. The JB-1 catheter was removed without  incident  Under real-time fluoroscopy, the shuttle select catheter was advanced into the right common carotid artery over an 0.035" guidewire. The guide sheath was then advanced into the right common carotid artery. After roadmap was taken, the sheath was advanced into the proximal internal carotid artery over the support of the select catheter and guidewire.The select catheter was removed and a 072 Navien guidecatheter was coaxially introduced over the Marksman microcatheter and microwire. Pre-embolization angiography was performed.  Under roadmap guidance, the Baltazar Apo was advanced over a Synchro 2 standard microwire into the M3 segment of the superior division. The Navien catheter was then tracked over the [facility] to its final position in the horizontal cavernous internal carotid artery. The Marksman was then removed and the coiling catheter and balloon catheter were introduced over the microwire in a biaxial fashion. The balloon catheter was then navigated into the A1 A2 junction, across the neck of the aneurysm over the support of a microwire which was positioned in the A3 segment. The coiling catheter was then navigated into the aneurysm lumen over the microwire. The wire was then removed. During placement of the framing coil, a portion of the coil appear to be extraluminal. There were no changes in vital signs noted at this point. The balloon catheter was then inflated, and 20 mg of protamine was immediately administered by anesthesia. This coil was then deployed. The balloon was then deflated, and angiogram was taken which did not demonstrate any contrast extravasation. The second above coil was then deployed.  Cerebral angiography was performed immediately post embolization. The coiling and balloon catheters were then removed and the Navien withdrawn into the cervical internal carotid artery.Final control angiography was then performed from the Navien guide catheter.  The Navien catheter and sheath were  then synchronously removed without incident. The 8-Fr sheath was removed and exchanged for a 7 [place] ExoSeal closure device which was deployed successfully.  INTERPRETATION:  Right femoral artery:  The right femoral artery is unremarkable, arterial sheath in adequate position.  Right common carotid: neck:  There is no significant stenosis, occlusion, aneurysm or plaque visualized on this injection.  Right internal carotid: head:  Injection reveals the presence of a widely patent ICA, M1, and A1 segments and their branches. There is an irregularly shaped inferiorly and leftward projecting aneurysm arising from the right A1 A2 junction. Flash filling across the anterior communicating artery of the contralateral A2 segment is seen. This aneurysm measures approximately 4.2 x 3.8 mm. It appears to have a relatively narrow neck. The parenchymal and venous phases are normal. The venous sinuses are widely patent.  Left common carotid: neck:  There is no significant stenosis, occlusion, aneurysm or plaque visualized on this injection. 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  Left common carotid: head:  Injection reveals the presence of a widely patent ICA, A1, and M1 segments and their branches. There is no significant stenosis, occlusion, aneurysm, or high flow vascular malformation visualized. Filling of the anterior communicating artery aneurysm is not seen on this left-sided injection. The parenchymal and venous phases are normal. The venous sinuses are widely patent.  Left vertebral:  Injection reveals the presence of a widely patent vertebral artery. This leads to a widely patent basilar artery that terminates in bilateral  P1. The basilar apex is normal. There is no significant stenosis, occlusion, aneurysm, or vascular malformation visualized. The parenchymal and venous phases are normal. The venous sinuses are widely patent.  Right vertebral:  Normal vessel. No PICA aneurysm.  See basilar description above.  Right internal carotid artery, head (pre-embolization):  Injection reveals the presence of a widely patent ICA that leads to a patent ACA and MCA. The ICA aneurysm is again visualized as described in the diagnostic portion of the procedure.  Right internal carotid artery, head (during embolization):  Balloon catheter position is seen across the neck of the aneurysm. Coil mass is also seen within the aneurysm, however a portion of the coil mass appears to extend more inferiorly, and may be extraluminal. No contrast filling of the actual aneurysm is seen. No contrast extravasation is seen.  Right internal carotid artery, head (immediate post-embolization):  Injection reveals the presence of a widely patent ICA that leads to a patent ACA and MCA. Coil mass within the aneurysm is stable, without coil prolapse or filling defect to suggest thrombus. No aneurysm filling is seen. The distal right anterior cerebral artery is widely patent, and flash filling across the communicating artery of the left ACA is also seen.  Right internal carotid artery, head (final control):  Injection reveals the presence of a widely patent ICA that leads to a patent ACA and MCA. No thrombus is visualized. Coil mass is seen within the aneurysm without any aneurysm filling seen. The parenchymal phase does not demonstrate any perfusion deficits. Venous phase is unremarkable.  DISPOSITION:  After completion of the procedure, the femoral sheath was replaced with a closure device which was deployed successfully.  The patient was kept intubated and transferred to the neuro critical care unit for further care.  IMPRESSION:  1. 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 No aneurysm filling is seen post-embolization.   Electronically Signed   By: Consuella Lose   On: 09/12/2014 19:52   Ir Angio Vertebral Sel Vertebral Bilat Mod Sed  09/12/2014   PROCEDURE: 1.  DIAGNOSTIC CEREBRAL ANGIOGRAM  2. COIL EMBOLIZATION OF RIGHT ANTERIOR CEREBRAL ARTERY ANEURYSM  OPERATORS:  Dr. Consuella Lose, MD.  HISTORY:  The patient is a 63 year old woman who presented to the emergency department with altered mental status. CT scan demonstrated diffuse subarachnoid hemorrhage, and CTA demonstrated the possibility of an anterior communicating artery aneurysm which may be amenable to coil embolization. The patient now presents for diagnostic cerebral angiogram with possible coil embolization.  APPROACH:  The technical aspects of the procedure as well as its potential risks and benefits were reviewed with the patient's family. These risks included but were not limited to bleeding, infection, allergic reaction, damage to organs/vital structures, stroke, inability to repair vessel/place stent, delayed aneurysm rupture, and catastrophic outcomes of heart attack, coma, and death. With the understanding of these risks, informed consent was obtained and witnessed.  The patient was placed in the supine position on the angiography table and the skin of the right groin was prepped in the usual sterile fashion. The procedure was performed under general anesthesia provided by the anesthesiology department.  A 5 French sheath was introduced in the right femoral artery using Seldinger technique.  HEPARIN: 0 Units  FLUOROSCOPY TIME: 37.6 combined AP and Lateral minutes.  CONTRAST: 100cc Omnipaque 300  CATHETERS AND WIRES:  5-French JB-1 glide catheter  180 cm 0.035" glidewire  6-French 90cm Shuttle sheath  6-French Shuttle Select JB-1 catheter  0.072" Navien guidecatheter  150 cm Marksman microcatheter  Synchro 2 standard microwire  Excelsior SL-10 45 degree microcatheter  57m x 536mTransForm [place] balloon catheter  COILS USED (MRI compatible):  3.52m80m 8cm Target 360 Ultra  3mm552m6cm Target 360 Ultra  VESSELS CATHETERIZED:  Right common femoral artery  Right common carotid artery  Right internal carotid  artery  Right vertebral artery  Left vertebral artery  Left common carotid artery  Right middle cerebral artery, M3 segment  Right anterior cerebral artery, A3 segment  VESSELS STUDIED:  Right common carotid: neck: AP and lateral  Right internal carotid: head: AP, lateral, obliques  Right vertebral: AP, lateral and trans-facial  Left common carotid: neck: AP and lateral  Left common carotid: head: AP, lateral, obliques  Left vertebral: AP, lateral  Right internal carotid artery, head: magnified obliques (pre-embolization)  Right internal carotid artery, head: magnified obliques (during coiling)  Right internal carotid artery, head: magnified obliques (immediate post-embolization)  Right internal carotid artery, head: AP, lateral (final control)  PROCEDURAL NARRATIVE:  A 5-Fr JB-1 terumo glide catheter was then advanced over a 0.035 glidewire into the aortic arch and the innominate and right common carotid artery followed by the internal carotid artery were selected. Cervical and cerebral angiography were performed. The JB-1 was then withdrawn into the innominate artery and the right subclavian artery followed by the right vertebral artery were selected. Cerebral angiography was performed. The JB-1 was then withdrawn into the aortic arch and the left common carotid artery was selected. Cervical and cerebral angiography were performed. The JB-1 catheter was then withdrawn into the aortic arch and the left subclavian artery was selected followed by the left vertebral artery. Cerebral angiography was performed. The JB-1 catheter was removed without incident  Under real-time fluoroscopy, the shuttle select catheter was advanced into the right common carotid artery over an 0.035" guidewire. The guide sheath was then advanced into the right common carotid artery. After roadmap was taken, the sheath was advanced into the proximal internal carotid artery over the support of the select catheter and guidewire.The select  catheter was removed and a 072 Navien guidecatheter was coaxially introduced over the Marksman microcatheter and microwire. Pre-embolization angiography was performed.  Under roadmap guidance, the MarkBaltazar Apo advanced over a Synchro 2 standard microwire into the M3 segment of the superior division. The Navien catheter was then tracked over the [facility] final position in the horizontal cavernous internal carotid artery. The Marksman was then removed and the coiling catheter and balloon catheter were introduced over the microwire in a biaxial fashion. The balloon catheter was then navigated into the A1 A2 junction, across the neck of the aneurysm over the support of a microwire which was positioned in the A3 segment. The coiling catheter was then navigated into the aneurysm lumen over the microwire. The wire was then removed. During placement of the framing coil, a portion of the coil appear to be extraluminal. There were no changes in vital signs noted at this point. The balloon catheter was then inflated, and 20 mg of protamine was immediately administered by anesthesia. This coil was then deployed. The balloon was then deflated, and angiogram was taken which did not demonstrate any contrast extravasation. The second above coil was then deployed.  Cerebral angiography was performed immediately post embolization. The coiling and balloon catheters were then removed and the Navien withdrawn into the cervical internal carotid artery.Final control angiography was then performed from the Navien guide catheter.  The Navien catheter and sheath were then synchronously removed without incident. The 8-Fr sheath was removed and exchanged for a 7 [place] ExoSeal closure device which was deployed successfully.  INTERPRETATION:  Right femoral artery:  The right femoral artery is unremarkable, arterial sheath in adequate position.  Right common carotid: neck:  There is no significant stenosis, occlusion, aneurysm or plaque  visualized on this injection.  Right internal carotid: head:  Injection reveals the presence of a widely patent ICA, M1, and A1 segments and their branches. There is an irregularly shaped inferiorly and leftward projecting aneurysm arising from the right A1 A2 junction. Flash filling across the anterior communicating artery of the contralateral A2 segment is seen. This aneurysm measures approximately 4.2 x 3.8 mm. It appears to have a relatively narrow neck. The parenchymal and venous phases are normal. The venous sinuses are widely patent.  Left common carotid: neck:  There is no significant stenosis, occlusion, aneurysm or plaque visualized on this injection. 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  Left common carotid: head:  Injection reveals the presence of a widely patent ICA, A1, and M1 segments and their branches. There is no significant stenosis, occlusion, aneurysm, or high flow vascular malformation visualized. Filling of the anterior communicating artery aneurysm is not seen on this left-sided injection. The parenchymal and venous phases are normal. The venous sinuses are widely patent.  Left vertebral:  Injection reveals the presence of a widely patent vertebral artery. This leads to a widely patent basilar artery that terminates in bilateral P1. The basilar apex is normal. There is no significant stenosis, occlusion, aneurysm, or vascular malformation visualized. The parenchymal and venous phases are normal. The venous sinuses are widely patent.  Right vertebral:  Normal vessel. No PICA aneurysm. See basilar description above.  Right internal carotid artery, head (pre-embolization):  Injection reveals the presence of a widely patent ICA that leads to a patent ACA and MCA. The ICA aneurysm is again visualized as described in the diagnostic portion of the procedure.  Right internal carotid artery, head (during embolization):  Balloon catheter position is seen across  the neck of the aneurysm. Coil mass is also seen within the aneurysm, however a portion of the coil mass appears to extend more inferiorly, and may be extraluminal. No contrast filling of the actual aneurysm is seen. No contrast extravasation is seen.  Right internal carotid artery, head (immediate post-embolization):  Injection reveals the presence of a widely patent ICA that leads to a patent ACA and MCA. Coil mass within the aneurysm is stable, without coil prolapse or filling defect to suggest thrombus. No aneurysm filling is seen. The distal right anterior cerebral artery is widely patent, and flash filling across the communicating artery of the left ACA is also seen.  Right internal carotid artery, head (final control):  Injection reveals the presence of a widely patent ICA that leads to a patent ACA and MCA. No thrombus is visualized. Coil mass is seen within the aneurysm without any aneurysm filling seen. The parenchymal phase does not demonstrate any perfusion deficits. Venous phase is unremarkable.  DISPOSITION:  After completion of the procedure, the femoral sheath was replaced with a closure device which was deployed successfully.  The patient was kept intubated and transferred to the neuro critical care unit for further care.  IMPRESSION:  1. 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 No aneurysm filling is seen post-embolization.   Electronically  Signed   By: Consuella Lose   On: 09/12/2014 19:52   Ir Neuro Each Add'l After Basic Uni Right (ms)  09/12/2014   PROCEDURE: 1. DIAGNOSTIC CEREBRAL ANGIOGRAM  2. COIL EMBOLIZATION OF RIGHT ANTERIOR CEREBRAL ARTERY ANEURYSM  OPERATORS:  Dr. Consuella Lose, MD.  HISTORY:  The patient is a 63 year old woman who presented to the emergency department with altered mental status. CT scan demonstrated diffuse subarachnoid hemorrhage, and CTA demonstrated the possibility of an anterior communicating artery  aneurysm which may be amenable to coil embolization. The patient now presents for diagnostic cerebral angiogram with possible coil embolization.  APPROACH:  The technical aspects of the procedure as well as its potential risks and benefits were reviewed with the patient's family. These risks included but were not limited to bleeding, infection, allergic reaction, damage to organs/vital structures, stroke, inability to repair vessel/place stent, delayed aneurysm rupture, and catastrophic outcomes of heart attack, coma, and death. With the understanding of these risks, informed consent was obtained and witnessed.  The patient was placed in the supine position on the angiography table and the skin of the right groin was prepped in the usual sterile fashion. The procedure was performed under general anesthesia provided by the anesthesiology department.  A 5 French sheath was introduced in the right femoral artery using Seldinger technique.  HEPARIN: 0 Units  FLUOROSCOPY TIME: 37.6 combined AP and Lateral minutes.  CONTRAST: 100cc Omnipaque 300  CATHETERS AND WIRES:  5-French JB-1 glide catheter  180 cm 0.035" glidewire  6-French 90cm Shuttle sheath  6-French Shuttle Select JB-1 catheter  0.072" Navien guidecatheter  150 cm Marksman microcatheter  Synchro 2 standard microwire  Excelsior SL-10 45 degree microcatheter  37m x 531mTransForm [place] balloon catheter  COILS USED (MRI compatible):  3.83m48m 8cm Target 360 Ultra  3mm13m6cm Target 360 Ultra  VESSELS CATHETERIZED:  Right common femoral artery  Right common carotid artery  Right internal carotid artery  Right vertebral artery  Left vertebral artery  Left common carotid artery  Right middle cerebral artery, M3 segment  Right anterior cerebral artery, A3 segment  VESSELS STUDIED:  Right common carotid: neck: AP and lateral  Right internal carotid: head: AP, lateral, obliques  Right vertebral: AP, lateral and trans-facial  Left common carotid: neck: AP and lateral  Left  common carotid: head: AP, lateral, obliques  Left vertebral: AP, lateral  Right internal carotid artery, head: magnified obliques (pre-embolization)  Right internal carotid artery, head: magnified obliques (during coiling)  Right internal carotid artery, head: magnified obliques (immediate post-embolization)  Right internal carotid artery, head: AP, lateral (final control)  PROCEDURAL NARRATIVE:  A 5-Fr JB-1 terumo glide catheter was then advanced over a 0.035 glidewire into the aortic arch and the innominate and right common carotid artery followed by the internal carotid artery were selected. Cervical and cerebral angiography were performed. The JB-1 was then withdrawn into the innominate artery and the right subclavian artery followed by the right vertebral artery were selected. Cerebral angiography was performed. The JB-1 was then withdrawn into the aortic arch and the left common carotid artery was selected. Cervical and cerebral angiography were performed. The JB-1 catheter was then withdrawn into the aortic arch and the left subclavian artery was selected followed by the left vertebral artery. Cerebral angiography was performed. The JB-1 catheter was removed without incident  Under real-time fluoroscopy, the shuttle select catheter was advanced into the right common carotid artery over an 0.035" guidewire. The guide sheath was  then advanced into the right common carotid artery. After roadmap was taken, the sheath was advanced into the proximal internal carotid artery over the support of the select catheter and guidewire.The select catheter was removed and a 072 Navien guidecatheter was coaxially introduced over the Marksman microcatheter and microwire. Pre-embolization angiography was performed.  Under roadmap guidance, the [place]zar Apo was advanced over a Synchro 2 standard microwire into the M3 segment of the superior division. The Navien catheter was then tracked over the [facility] to its final position in the  horizontal cavernous internal carotid artery. The Marksman was then removed and the coiling catheter and balloon catheter were introduced over the microwire in a biaxial fashion. The balloon catheter was then navigated into the A1 A2 junction, across the neck of the aneurysm over the support of a microwire which was positioned in the A3 segment. The coiling catheter was then navigated into the aneurysm lumen over the microwire. The wire was then removed. During placement of the framing coil, a portion of the coil appear to be extraluminal. There were no changes in vital signs noted at this point. The balloon catheter was then inflated, and 20 mg of protamine was immediately administered by anesthesia. This coil was then deployed. The balloon was then deflated, and angiogram was taken which did not demonstrate any contrast extravasation. The second above coil was then deployed.  Cerebral angiography was performed immediately post embolization. The coiling and balloon catheters were then removed and the Navien withdrawn into the cervical internal carotid artery.Final control angiography was then performed from the Navien guide catheter.  The Navien catheter and sheath were then synchronously removed without incident. The 8-Fr sheath was removed and exchanged for a 7 [place] ExoSeal closure device which was deployed successfully.  INTERPRETATION:  Right femoral artery:  The right femoral artery is unremarkable, arterial sheath in adequate position.  Right common carotid: neck:  There is no significant stenosis, occlusion, aneurysm or plaque visualized on this injection.  Right internal carotid: head:  Injection reveals the presence of a widely patent ICA, M1, and A1 segments and their branches. There is an irregularly shaped inferiorly and leftward projecting aneurysm arising from the right A1 A2 junction. Flash filling across the anterior communicating artery of the contralateral A2 segment is seen. This aneurysm  measures approximately 4.2 x 3.8 mm. It appears to have a relatively narrow neck. The parenchymal and venous phases are normal. The venous sinuses are widely patent.  Left common carotid: neck:  There is no significant stenosis, occlusion, aneurysm or plaque visualized on this injection. 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  Left common carotid: head:  Injection reveals the presence of a widely patent ICA, A1, and M1 segments and their branches. There is no significant stenosis, occlusion, aneurysm, or high flow vascular malformation visualized. Filling of the anterior communicating artery aneurysm is not seen on this left-sided injection. The parenchymal and venous phases are normal. The venous sinuses are widely patent.  Left vertebral:  Injection reveals the presence of a widely patent vertebral artery. This leads to a widely patent basilar artery that terminates in bilateral P1. The basilar apex is normal. There is no significant stenosis, occlusion, aneurysm, or vascular malformation visualized. The parenchymal and venous phases are normal. The venous sinuses are widely patent.  Right vertebral:  Normal vessel. No PICA aneurysm. See basilar description above.  Right internal carotid artery, head (pre-embolization):  Injection reveals the presence of a widely  patent ICA that leads to a patent ACA and MCA. The ICA aneurysm is again visualized as described in the diagnostic portion of the procedure.  Right internal carotid artery, head (during embolization):  Balloon catheter position is seen across the neck of the aneurysm. Coil mass is also seen within the aneurysm, however a portion of the coil mass appears to extend more inferiorly, and may be extraluminal. No contrast filling of the actual aneurysm is seen. No contrast extravasation is seen.  Right internal carotid artery, head (immediate post-embolization):  Injection reveals the presence of a widely patent ICA that  leads to a patent ACA and MCA. Coil mass within the aneurysm is stable, without coil prolapse or filling defect to suggest thrombus. No aneurysm filling is seen. The distal right anterior cerebral artery is widely patent, and flash filling across the communicating artery of the left ACA is also seen.  Right internal carotid artery, head (final control):  Injection reveals the presence of a widely patent ICA that leads to a patent ACA and MCA. No thrombus is visualized. Coil mass is seen within the aneurysm without any aneurysm filling seen. The parenchymal phase does not demonstrate any perfusion deficits. Venous phase is unremarkable.  DISPOSITION:  After completion of the procedure, the femoral sheath was replaced with a closure device which was deployed successfully.  The patient was kept intubated and transferred to the neuro critical care unit for further care.  IMPRESSION:  1. 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 No aneurysm filling is seen post-embolization.   Electronically Signed   By: Consuella Lose   On: 09/12/2014 19:52    IMPRESSION:  1. NSTEM: with LAD infarction pattern on ECG and wall motion abnormalities on echo 2. Ischemic cardiomyopathy with LAD infarction and CHF 3. Respiratory distress/Pulmonary edema now on Bi-PAP 4. Diffuse T wave changes ischemic vs contributed by [facility] 5. Markedly prolonged QTc; Continues to be hypokalemic, check Ca, Mg 6. 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 Hypokalemia 8. Aspiration Pneumonia  Had discussion with son. At present no cardiac catheterization but ultimately once stable will need to pursue. Will like to start ACE-I when BP is appropriate. Will need to watch for vasospasm and avoid progressive CHF if increased volume status is necessary. With recent SAH, no anticoagulation. K replete. Avoid QT prolonging  drugs. F/U BNP in am.  Will ultimately need cardioselective beta-blockade once appropriate from pulmonary standpoint.   Troy Sine, MD, [facility] 09/13/2014 10:32 AM

## 2014-09-13 NOTE — Progress Notes (Signed)
RN tried throuh nasal route - tube coling  CCM PA tried through oral route via glidescope - but unclear if he is able to pass through or passed and then resistance  Plan D./w radiology Dr Shon Hale - radiology to try tonight under fluoro   Dr. Brand Males, M.D., [facility].C.P Pulmonary and Critical Care Medicine Staff Physician [place] [facility] Pager: [phone], If no answer or between  15:00h - 7:00h: call [phone]  09/13/2014 7:19 PM

## 2014-09-13 NOTE — Procedures (Signed)
Supervised by me

## 2014-09-13 NOTE — Procedures (Signed)
Intubation Procedure Note Mindy Mills [id number] Jan 22, 1951  Procedure: Intubation Indications: Respiratory insufficiency  Procedure Details Consent: Risks of procedure as well as the alternatives and risks of each were explained to the (patient/caregiver).  Consent for procedure obtained. Time Out: Verified patient identification, verified procedure, site/side was marked, verified correct patient position, special equipment/implants available, medications/allergies/relevent history reviewed, required imaging and test results available.  Performed  Maximum sterile technique was used including antiseptics, cap, gloves, hand hygiene and mask.  MAC and 3    Evaluation Hemodynamic Status: BP stable throughout; O2 sats: stable throughout Patient's Current Condition: stable Complications: No apparent complications Patient did tolerate procedure well. Chest X-ray ordered to verify placement.  CXR: pending.   [name],PETE 09/13/2014

## 2014-09-13 NOTE — Progress Notes (Signed)
SLP Cancellation Note  Patient Details Name: [name] MRN: [id number] DOB: 1951/10/03   Cancelled treatment:       Reason Eval/Treat Not Completed: Medical issues which prohibited therapy. Per chart review, patient required reintubation. Will sign off at this time. Please re-order SLP when patient is extubated and ready for swallowing evaluation.    Germain Osgood, M.A. CCC-SLP [phone]  Germain Osgood 09/13/2014, 2:06 PM

## 2014-09-13 NOTE — Progress Notes (Signed)
OG placement no longer adequate. 3 RNs tried to advance and/or replace with no success.  Called Elink for new order for Panda tube.  2 separate RNs tried with no success.  Calling Elink for further advice.  Schedule nimodipine dose needs to be given. [place], [place]

## 2014-09-13 NOTE — Progress Notes (Signed)
INITIAL NUTRITION ASSESSMENT  DOCUMENTATION CODES Per approved criteria  -Not Applicable   INTERVENTION: Initiate Vital AF 1.2 @ 20 ml/hr via OG tube and increase by 10 ml every 4 hours to goal rate of 50 ml/hr.   Tube feeding regimen provides 1440 kcal (98% of needs), 90 grams of protein, and 973 ml of H2O.   NUTRITION DIAGNOSIS: Inadequate oral intake related to inability to eat as evidenced by NPO status  Goal: Pt to meet >/= 90% of their estimated nutrition needs   Monitor:  Respiratory status, TF initiation and tolerance, labs  Reason for Assessment: Consult received to initiate and manage enteral nutrition support.  63 y.o. female  Admitting Dx: SAH  ASSESSMENT: Pt admitted after witnessed grand mal seizure. 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 Pt self extubated 9/16 but re-intubated 9/17.  Patient is currently intubated on ventilator support MV: 12.2 L/min Temp (24hrs), Avg:98.1 F (36.7 C), Min:97.2 F (36.2 C), Max:98.7 F (37.1 C)  OG tube in place.  No fat or muscle depletion noted.  Potassium low on replacement.   Height: Ht Readings from Last 1 Encounters:  09/11/14 5\' 3"  (1.6 m)    Weight: Wt Readings from Last 1 Encounters:  09/12/14 151 lb 0.2 oz (68.5 kg)    Ideal Body Weight: 52.2 kg   % Ideal Body Weight: 131%  Wt Readings from Last 10 Encounters:  09/12/14 151 lb 0.2 oz (68.5 kg)  09/12/14 151 lb 0.2 oz (68.5 kg)    Usual Body Weight: unknown  % Usual Body Weight: -  BMI:  Body mass index is 26.76 kg/(m^2).  Estimated Nutritional Needs: Kcal: 1464 Protein: 85-100 grams Fluid: > 1.5 L/day  Skin: ecchymosis  Diet Order: NPO  EDUCATION NEEDS: -No education needs identified at this time   Intake/Output Summary (Last 24 hours) at 09/13/14 1546 Last data filed at 09/13/14 1506  Gross per 24 hour  Intake 1371.51 ml  Output   1925 ml  Net -553.49 ml    Last BM: 9/17    Labs:   Recent Labs Lab 09/11/14 1304 09/11/14 1325  09/11/14 1729 09/12/14 0450 09/12/14 2300  NA 139 140  < > 141 144 142  K 3.1* 3.0*  < > 2.9* 4.5 3.2*  CL 102 104  --   --  110 105  CO2 21  --   --   --  19 21  BUN 13 13  --   --  10 11  CREATININE 0.62 0.70  --   --  0.56 0.45*  CALCIUM 8.5  --   --   --  7.5* 8.5  MG  --   --   --   --  1.6  --   PHOS  --   --   --   --  2.2*  --   GLUCOSE 249* 249*  --  197* 162* 139*  < > = values in this interval not displayed.  CBG (last 3)  No results found for this basename: GLUCAP,  in the last 72 hours  Scheduled Meds: . antiseptic oral rinse  7 mL Mouth Rinse QID  . budesonide  0.5 mg Nebulization BID  . cefTRIAXone (ROCEPHIN)  IV  1 g Intravenous Q24H  . chlorhexidine  15 mL Mouth Rinse BID  . docusate sodium  100 mg Oral BID  . feeding supplement (VITAL HIGH PROTEIN)  1,000 mL Per Tube Q24H  .  fentaNYL      . fentaNYL  50 mcg Intravenous Once  . haloperidol lactate  5 mg Intravenous Once  . ipratropium-albuterol  3 mL Nebulization Q4H  . levETIRAcetam  500 mg Intravenous Q12H  . methylPREDNISolone (SOLU-MEDROL) injection  40 mg Intravenous 3 times per day  . multivitamin with minerals  1 tablet Oral Daily  . niMODipine  60 mg Oral Q4H   Or  . NiMODipine  60 mg Per Tube Q4H  . pantoprazole (PROTONIX) IV  40 mg Intravenous QHS  . senna  1 tablet Oral BID  . senna-docusate  1 tablet Oral BID  . simvastatin  80 mg Oral QHS    Continuous Infusions: . dexmedetomidine 0.6 mcg/kg/hr (09/13/14 1500)  . fentaNYL infusion INTRAVENOUS 100 mcg/hr (09/13/14 1500)    Past Medical History  Diagnosis Date  . Hypertension   . Migraine   . Cancer     Cervicle  . Hepatitis C     Past Surgical History  Procedure Laterality Date  . Abdominal hysterectomy    . Radiology with anesthesia N/A 09/11/2014    Procedure: Sub Arachnoid Aneurysm Coiling -RADIOLOGY WITH ANESTHESIA ;  Surgeon: Consuella Lose, MD;  Location: [facility];  Service: Radiology;  Laterality: N/A;    Maylon Peppers RD, [place], [place] Pager [phone] After Hours Pager

## 2014-09-13 NOTE — Progress Notes (Signed)
   Unable to pass OG due to cough  Plan Insert NG   Dr. Brand Males, M.D., [facility].C.P Pulmonary and Critical Care Medicine Staff Physician [place] [facility] Pager: [phone], If no answer or between  15:00h - 7:00h: call [phone]  09/13/2014 5:09 PM

## 2014-09-13 NOTE — Anesthesia Postprocedure Evaluation (Signed)
  Anesthesia Post-op Note  Patient: Mindy Mills  Procedure(s) Performed: Procedure(s): Sub Arachnoid Aneurysm Coiling -RADIOLOGY WITH ANESTHESIA  (N/A)  Patient Location: ICU  Anesthesia Type:General  Level of Consciousness: sedated and Patient remains intubated per anesthesia plan  Airway and Oxygen Therapy: Patient remains intubated per anesthesia plan and Patient placed on Ventilator (see vital sign flow sheet for setting)  Post-op Pain: none  Post-op Assessment: Post-op Vital signs reviewed, Patient's Cardiovascular Status Stable, Respiratory Function Stable, Patent Airway, No signs of Nausea or vomiting and Pain level controlled, pt remains intubated and sedated s/p coiling  Post-op Vital Signs: Reviewed and stable  Last Vitals:  Filed Vitals:   09/13/14 1237  BP:   Pulse: 88  Temp:   Resp: 24    Complications: No apparent anesthesia complications

## 2014-09-14 ENCOUNTER — Inpatient Hospital Stay (HOSPITAL_COMMUNITY): Payer: Medicare Other

## 2014-09-14 DIAGNOSIS — J9600 Acute respiratory failure, unspecified whether with hypoxia or hypercapnia: Secondary | ICD-10-CM

## 2014-09-14 DIAGNOSIS — I214 Non-ST elevation (NSTEMI) myocardial infarction: Secondary | ICD-10-CM

## 2014-09-14 DIAGNOSIS — R633 Feeding difficulties, unspecified: Secondary | ICD-10-CM

## 2014-09-14 DIAGNOSIS — J810 Acute pulmonary edema: Secondary | ICD-10-CM

## 2014-09-14 LAB — BASIC METABOLIC PANEL
Anion gap: 12 (ref 5–15)
BUN: 31 mg/dL — AB (ref 6–23)
CALCIUM: 8.4 mg/dL (ref 8.4–10.5)
CO2: 27 mEq/L (ref 19–32)
CREATININE: 0.85 mg/dL (ref 0.50–1.10)
Chloride: 110 mEq/L (ref 96–112)
GFR calc non Af Amer: 72 mL/min — ABNORMAL LOW (ref >90)
GFR, EST AFRICAN AMERICAN: 83 mL/min — AB (ref >90)
Glucose, Bld: 139 mg/dL — ABNORMAL HIGH (ref 70–99)
Potassium: 3.8 mEq/L (ref 3.7–5.3)
Sodium: 149 mEq/L — ABNORMAL HIGH (ref 137–147)

## 2014-09-14 LAB — GLUCOSE, CAPILLARY
GLUCOSE-CAPILLARY: 143 mg/dL — AB (ref 70–99)
Glucose-Capillary: 142 mg/dL — ABNORMAL HIGH (ref 70–99)
Glucose-Capillary: 154 mg/dL — ABNORMAL HIGH (ref 70–99)
Glucose-Capillary: 138 mg/dL — ABNORMAL HIGH (ref 70–99)
Glucose-Capillary: 151 mg/dL — ABNORMAL HIGH (ref 70–99)

## 2014-09-14 LAB — CBC
HCT: 34.8 % — ABNORMAL LOW (ref 36.0–46.0)
Hemoglobin: 11.2 g/dL — ABNORMAL LOW (ref 12.0–15.0)
MCH: 30.2 pg (ref 26.0–34.0)
MCHC: 32.2 g/dL (ref 30.0–36.0)
MCV: 93.8 fL (ref 78.0–100.0)
Platelets: 161 10*3/uL (ref 150–400)
RBC: 3.71 MIL/uL — ABNORMAL LOW (ref 3.87–5.11)
RDW: 14.7 % (ref 11.5–15.5)
WBC: 14.1 10*3/uL — ABNORMAL HIGH (ref 4.0–10.5)

## 2014-09-14 NOTE — Progress Notes (Signed)
Patient Name: Mindy Mills Date of Encounter: 09/14/2014  Active Problems:   Subarachnoid hemorrhage due to ruptured aneurysm   Acute respiratory failure   Hypokalemia   Altered mental status   Subarachnoid hemorrhage   Length of Stay: 3  SUBJECTIVE  Intubated, sedated, follows commands when allowed to awake. Troponin peaked at 5 and is rapidly decreasing. ECG shows very broad and deep anterior T wave inversion and massive QT prolongation. Echo with very extensive wall motion abnormalities involving multiple coronary territories.  CURRENT MEDS . antiseptic oral rinse  7 mL Mouth Rinse QID  . budesonide  0.5 mg Nebulization BID  . cefTRIAXone (ROCEPHIN)  IV  1 g Intravenous Q24H  . chlorhexidine  15 mL Mouth Rinse BID  . fentaNYL  50 mcg Intravenous Once  . haloperidol lactate  5 mg Intravenous Once  . ipratropium-albuterol  3 mL Nebulization Q4H  . levETIRAcetam  500 mg Intravenous Q12H  . methylPREDNISolone (SOLU-MEDROL) injection  40 mg Intravenous 3 times per day  . multivitamin with minerals  1 tablet Oral Daily  . niMODipine  60 mg Oral Q4H   Or  . NiMODipine  60 mg Per Tube Q4H  . pantoprazole (PROTONIX) IV  40 mg Intravenous QHS    OBJECTIVE   Intake/Output Summary (Last 24 hours) at 09/14/14 1040 Last data filed at 09/14/14 0800  Gross per 24 hour  Intake  771.4 ml  Output   1570 ml  Net -798.6 ml   Filed Weights   09/12/14 0322 09/13/14 1700 09/14/14 0425  Weight: 68.5 kg (151 lb 0.2 oz) 61.1 kg (134 lb 11.2 oz) 63.3 kg (139 lb 8.8 oz)    PHYSICAL EXAM Filed Vitals:   09/14/14 0427 09/14/14 0500 09/14/14 0600 09/14/14 0713  BP:  97/61 93/63   Pulse:  79 77 73  Temp:    100 F (37.8 C)  TempSrc:    Oral  Resp:  13 13 14   Height:      Weight:      SpO2: 96% 95% 95% 96%   General: Alert, oriented x3, no distress Head: no evidence of trauma, PERRL, EOMI, no exophtalmos or lid lag, no myxedema, no xanthelasma; normal ears, nose and  oropharynx Neck: normal jugular venous pulsations and no hepatojugular reflux; brisk carotid pulses without delay and no carotid bruits Chest: clear to auscultation, no signs of consolidation by percussion or palpation, normal fremitus, symmetrical and full respiratory excursions Cardiovascular: normal position and quality of the apical impulse, regular rhythm, normal first and second heart sounds, no rubs or gallops, 4-4/0 systolic murmur Abdomen: no tenderness or distention, no masses by palpation, no abnormal pulsatility or arterial bruits, normal bowel sounds, no hepatosplenomegaly Extremities: no clubbing, cyanosis or edema; 2+ radial, ulnar and brachial pulses bilaterally; 2+ right femoral, posterior tibial and dorsalis pedis pulses; 2+ left femoral, posterior tibial and dorsalis pedis pulses; no subclavian or femoral bruits Neurological: sedated  LABS  CBC  Recent Labs  09/11/14 1253 09/11/14 1304  09/12/14 2300 09/14/14 0540  WBC  --  23.4*  < > 21.3* 14.1*  NEUTROABS  --  17.9*  --   --   --   HGB 16.3*  16.3* 14.3  < > 12.1 11.2*  HCT 48.0*  48.0* 43.3  < > 36.1 34.8*  MCV  --  91.9  < > 90.9 93.8  PLT  --  315  < > 218 161  < > = values in this interval not displayed. Basic  Metabolic Panel  Recent Labs  09/11/14 1729 09/12/14 0450 09/12/14 2300 09/14/14 0540  NA 141 144 142 149*  K 2.9* 4.5 3.2* 3.8  CL  --  110 105 110  CO2  --  19 21 27   GLUCOSE 197* 162* 139* 139*  BUN  --  10 11 31*  CREATININE  --  0.56 0.45* 0.85  CALCIUM  --  7.5* 8.5 8.4  MG  --  1.6  --   --   PHOS  --  2.2*  --   --    Liver Function Tests  Recent Labs  09/11/14 1304  AST 16  ALT 10  ALKPHOS 77  BILITOT 0.3  PROT 6.5  ALBUMIN 3.5   No results found for this basename: LIPASE, AMYLASE,  in the last 72 hours Cardiac Enzymes  Recent Labs  09/12/14 0215 09/12/14 0455 09/13/14 0524  TROPONINI 5.40* 4.43* 1.62*    Radiology Studies Imaging results have been reviewed  and Dg Chest Port 1 View  09/14/2014   CLINICAL DATA:  Orogastric tube placement  EXAM: PORTABLE CHEST - 1 VIEW  COMPARISON:  Same date, 5:39 a.m.  FINDINGS: Interstitial pulmonary edema is minimally improved. Heart size normal. Endotracheal tube tip projects 1.6 cm inferior to the carina. Orogastric tube is coiled upon itself over the mid esophagus with tip projecting over the expected location of a hiatal hernia. Right subclavian central line tip terminates over the distal esophagus.  IMPRESSION: Endotracheal tube tip 1.6 cm below the carina over the right mainstem bronchus. Consider pulling back to 3 cm.  Orogastric tube coiled over itself over the esophagus.  Repositioning is recommended.  Critical Value/emergent results were called by telephone at the time of interpretation on 09/14/2014 at 9:51 am to nurse [facility] for Dr. Kara Mead , who verbally acknowledged these results.   Electronically Signed   By: Conchita Paris M.D.   On: 09/14/2014 09:51   Dg Chest Port 1 View  09/14/2014   CLINICAL DATA:  pneumonitis  EXAM: PORTABLE CHEST - 1 VIEW  COMPARISON:  the previous day's study  FINDINGS: Endotracheal tube stable. Feeding tube is pulled back to the mid esophagus. Right subclavian central line stable. No pneumothorax. Coarse airspace opacities involving bases more than apices bilaterally, perhaps slightly improved since previous exam. Suspect layering pleural effusions. Mild cardiomegaly. Retrocardiac gas density suggesting hiatal hernia.  Visualized skeletal structures are unremarkable.  IMPRESSION: 1. Malpositioned feeding tube tip, in the mid esophagus. 2. Marginal improvement in bilateral infiltrates/edema   Electronically Signed   By: Arne Cleveland M.D.   On: 09/14/2014 08:17   Portable Chest Xray  09/13/2014   CLINICAL DATA:  Endotracheal tube placement.  EXAM: PORTABLE CHEST - 1 VIEW  COMPARISON:  Same day.  FINDINGS: Endotracheal tube is in grossly good position with distal tip 5.7 cm above  the carina. Nasogastric tube is seen passing through the esophagus. Stable bilateral perihilar and basilar interstitial densities are noted consistent with pulmonary edema. Increased bilateral pleural effusions are noted. No pneumothorax is noted. Interval placement of right subclavian catheter line with distal tip in the expected position of the SVC.  IMPRESSION: Endotracheal tube in grossly good position. Right subclavian catheter line seen with distal tip in expected position of the SVC. Stable bilateral pulmonary edema.   Electronically Signed   By: Sabino Dick M.D.   On: 09/13/2014 13:51   Dg Chest Port 1 View  09/13/2014   CLINICAL DATA:  Pneumonitis  EXAM:  PORTABLE CHEST - 1 VIEW  COMPARISON:  09/12/2014  FINDINGS: Interval extubation.  Multifocal patchy airspace opacities bilaterally, in a perihilar distribution, favored to reflect moderate interstitial edema. This appears to have mildly progressed from the recent prior. Suspected small left pleural effusion. No pneumothorax.  The heart is top-normal in size.  IMPRESSION: Interval extubation.  Multifocal patchy airspace opacities, favored to reflect moderate interstitial edema, mildly progressed.  Suspected small left pleural effusion.   Electronically Signed   By: Julian Hy M.D.   On: 09/13/2014 08:01   Dg Abd Portable 1v  09/14/2014   CLINICAL DATA:  Nasogastric tube position.  EXAM: PORTABLE ABDOMEN - 1 VIEW  COMPARISON:  September 13, 2014.  FINDINGS: The bowel gas pattern is normal. Distal tip of feeding tube is seen in distal esophagus. It is well above the gastroesophageal junction and significant advancement is required. Mild dextroscoliosis of lumbar spine is noted. Left renal calculus is noted.  IMPRESSION: Tip of feeding tube is seen in distal esophagus well above the gastroesophageal junction. Significant advancement is recommended.   Electronically Signed   By: Sabino Dick M.D.   On: 09/14/2014 08:02   Dg Abd Portable  1v  09/13/2014   CLINICAL DATA:  NG tube placement verification.  EXAM: PORTABLE ABDOMEN - 1 VIEW  COMPARISON:  09/11/2014  FINDINGS: Examination is limited by prominent rightward patient rotation. An enteric tube projects just below the level of the hemidiaphragms, more proximal than on the prior study. It is difficult to precisely locate its tip due to patient rotation, however the tip is most likely in the region of the GE junction or distal esophagus. There is mild gaseous distention of a loop of bowel in the left mid abdomen without other evidence of obstruction. Left greater than right lung opacities are better evaluated on concurrent chest radiograph.  IMPRESSION: Suboptimal examination due to patient rotation. Enteric tube tip in the region of the distal esophagus/ GE junction. Recommend advancement by at least 5 cm and obtaining a repeat radiograph. If placement of side hole in the stomach is desired, advancement by at least 10 cm is recommended.   Electronically Signed   By: Logan Bores   On: 09/13/2014 13:44    TELE NSR. No ventricular arrhythmia  ECG SR versus accelerated high junctional rhythm (short PR), massive QT prolongation (QTc 670 ms)  ASSESSMENT AND PLAN  Overall, pattern is very suggestive of stress cardiomyopathy. The wall motion abnormalities are way out of proportion to the relatively small enzyme leak and they do not respect typical coronary anatomy. She is vulnerable to acute HF until this resolves and vulnerable to ventricular arrhythmia until QT shortens. Keep tight control on in/out balance and keep K and Mg well above lower limit of normal. Recheck echo early next week. If she has full recovery of LV function, may not need a heart cath, but a noninvasive evaluation for CAD. At this point, USE of CHF meds is limited by inability to administer meds enterally and the need to maintain high perfusion/flow to reduce risk of cerebral arterial spasm.   Sanda Klein, MD,  [facility] [phone] office [phone] pager 09/14/2014 10:40 AM

## 2014-09-14 NOTE — Progress Notes (Signed)
PULMONARY / CRITICAL CARE MEDICINE   Name: Mindy Mills MRN: [id number] DOB: 06-17-1951    ADMISSION DATE:  09/11/2014 CONSULTATION DATE:  09/14/2014  REFERRING MD :  Kathyrn Sheriff  CHIEF COMPLAINT:  Seizure  INITIAL PRESENTATION: 63 y.o. F brought to [facility] ED on 9/15 after a witnessed syncopal episode followed by a grand mal seizure while in the car.  Upon ED arrival, pt was unresponsive and not moving her right side.  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  She was intubated and underwent endovascular procedure by Dr. Leighton Ruff.  PCCM was consulted. PMH - hepc, smoker, chronic headaches on narcotics  STUDIES:  CTA Head 9/15 >>> large SAH, 2.5 x 85mm right ACA aneurysm as source of SAH. Echo 9/16 >>EF 25-30%, apical akinesis- LAD infarction pattern.Moderate diastolic dysfunction .  RVSP 50  SIGNIFICANT EVENTS: 9/15 - admitted with [facility] and right ACA aneurysm, underwent endovascular repair, returned to ICU on vent. 9/15 - A com aneurysm coiled 9/16 difficulty with panda   SUBJECTIVE: Awake off drips this am febrile Good UO with lasix  VITAL SIGNS: Temp:  [97.8 F (36.6 C)-102.8 F (39.3 C)] 100 F (37.8 C) (09/18 0713) Pulse Rate:  [49-108] 73 (09/18 0713) Resp:  [12-30] 14 (09/18 0713) BP: (68-148)/(38-86) 93/63 mmHg (09/18 0600) SpO2:  [91 %-100 %] 96 % (09/18 0713) FiO2 (%):  [60 %] 60 % (09/18 0713) Weight:  [61.1 kg (134 lb 11.2 oz)-63.3 kg (139 lb 8.8 oz)] 63.3 kg (139 lb 8.8 oz) (09/18 0425) HEMODYNAMICS: CVP:  [0 mmHg-14 mmHg] 6 mmHg VENTILATOR SETTINGS: Vent Mode:  [-] PRVC FiO2 (%):  [60 %] 60 % Set Rate:  [15 bmp] 15 bmp Vt Set:  [420 mL] 420 mL PEEP:  [5 cmH20] 5 cmH20 Plateau Pressure:  [3 cmH20-16 cmH20] 11 cmH20 INTAKE / OUTPUT: Intake/Output     09/17 0701 - 09/18 0700 09/18 0701 - 09/19 0700   I.V. (mL/kg) 574.1 (9.1)    Other 125    IV Piggyback 300    Total Intake(mL/kg) 999.1 (15.8)    Urine (mL/kg/hr) 1370 (0.9) 200 (1.2)   Total Output  1370 200   Net -370.9 -200        Stool Occurrence 1 x      PHYSICAL EXAMINATION: General: Chronically ill appearing elderly female,  intubated, in NAD. Neuro: awake off drips, mild agitated, non-focal, RASS 0 to +1 HEENT: [place]/AT. PERRL, sclerae anicteric. Cardiovascular: RRR, no M/R/G.  Lungs: Respirations even and unlabored. BL decreased rhonchi Abdomen: BS x 4, soft, NT/ND.  Musculoskeletal: No gross deformities, no edema.  Skin: Intact, warm, no rashes.  LABS:  CBC  Recent Labs Lab 09/11/14 2030 09/12/14 2300 09/14/14 0540  WBC 17.8* 21.3* 14.1*  HGB 13.3 12.1 11.2*  HCT 39.8 36.1 34.8*  PLT 301 218 161   Coag's  Recent Labs Lab 09/11/14 2030  APTT 26  INR 1.04   BMET  Recent Labs Lab 09/12/14 0450 09/12/14 2300 09/14/14 0540  NA 144 142 149*  K 4.5 3.2* 3.8  CL 110 105 110  CO2 19 21 27   BUN 10 11 31*  CREATININE 0.56 0.45* 0.85  GLUCOSE 162* 139* 139*   Electrolytes  Recent Labs Lab 09/12/14 0450 09/12/14 2300 09/14/14 0540  CALCIUM 7.5* 8.5 8.4  MG 1.6  --   --   PHOS 2.2*  --   --    Sepsis Markers No results found for this basename: LATICACIDVEN, PROCALCITON, O2SATVEN,  in the  last 168 hours  ABG  Recent Labs Lab 09/12/14 0459 09/12/14 1306 09/13/14 1313  PHART 7.303* 7.415 7.467*  PCO2ART 40.1 32.6* 37.2  PO2ART 96.8 49.0* 64.0*    Liver Enzymes  Recent Labs Lab 09/11/14 1304  AST 16  ALT 10  ALKPHOS 77  BILITOT 0.3  ALBUMIN 3.5   Cardiac Enzymes  Recent Labs Lab 09/11/14 2030 09/12/14 0215 09/12/14 0455 09/13/14 0524  TROPONINI 5.10* 5.40* 4.43* 1.62*  PROBNP 3137.0*  --   --   --     Glucose  Recent Labs Lab 09/14/14 0756  GLUCAP 142*    Imaging CXR - BL ASD -decreased edema pattern  ASSESSMENT / PLAN:  PULMONARY OETT 9/15 >>>9/16, 9/17 >> A: VDRF - in setting of 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 Precedex gtt Keppra per neuro recs. Ct nimodipine  CARDIOVASCULAR A:  Hx HTN Incomplete LBBB on EKG, nstemi Acute Pulmonary edema on CXR , high BNP NSTEMI - LAD pattern WMA on echo P:  Cards consult Hold Lasix , CVP goal 6-8 today, will let rise in 24h when in vasospasm range  RENAL A:   Hypokalemia Mild metabolic acidosis- resolved P:  Replace electrolytes as indicated.  GASTROINTESTINAL A:  Hx HCV Difficult panda placement P:   SUP: Pantoprazole. Npo for now - only use pAnda for nimotop  HEMATOLOGIC A:   VTE Prophylaxis P:  SCD's only.   INFECTIOUS A:  Aspiration pneumonia resp cult 9/17 >> P:   ct ceftx/ clinda  ENDOCRINE A:   Hyperglycemia  P:   CBG's q4hr. SSI.    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  CRITICAL CARE: The patient is critically ill with multiple organ systems failure and requires high complexity decision making for assessment and support, frequent evaluation and titration of therapies, application of advanced monitoring technologies and extensive interpretation of multiple databases. Critical Care Time devoted to patient care services described in this note is 35 minutes.   Kara Mead MD. Shade Flood. [place] [facility] Pager [phone] If no response call [phone]     09/13/2014, 9:19 AM

## 2014-09-14 NOTE — Procedures (Signed)
Oral gastric tube iserted - checked with air insufflation - position seems good. Will get KUB to confirm given prior difficulties.

## 2014-09-14 NOTE — Progress Notes (Signed)
VASCULAR LAB PRELIMINARY  PRELIMINARY  PRELIMINARY  PRELIMINARY  Transcranial Doppler  Date POD PCO2 HCT BP  MCA ACA PCA OPHT SIPH VERT Basilar  9-16- 15 SB     Right  Left   42  27   -35  -31   28  24   15  13    36  45   -18  --     -31    9-18- 15 SB     Right  Left   62  39   --  -41   --  --   12  12   38  39   --  -29     --         Right  Left                                             Right  Left                                             Right  Left                                            Right  Left                                            Right  Left                                        MCA = Middle Cerebral Artery      OPHT = Opthalmic Artery     BASILAR = Basilar Artery   ACA = Anterior Cerebral Artery     SIPH = Carotid Siphon PCA = Posterior Cerebral Artery   VERT = Verterbral Artery                   Normal MCA = 62+\-12 ACA = 50+\-12 PCA = 42+\-23         Barby Colvard, RVT 09/14/2014, 11:13 AM

## 2014-09-15 ENCOUNTER — Inpatient Hospital Stay (HOSPITAL_COMMUNITY): Payer: Medicare Other

## 2014-09-15 LAB — GLUCOSE, CAPILLARY
GLUCOSE-CAPILLARY: 146 mg/dL — AB (ref 70–99)
GLUCOSE-CAPILLARY: 152 mg/dL — AB (ref 70–99)
Glucose-Capillary: 139 mg/dL — ABNORMAL HIGH (ref 70–99)
Glucose-Capillary: 147 mg/dL — ABNORMAL HIGH (ref 70–99)
Glucose-Capillary: 159 mg/dL — ABNORMAL HIGH (ref 70–99)

## 2014-09-15 LAB — RAPID URINE DRUG SCREEN, HOSP PERFORMED
AMPHETAMINES: NOT DETECTED
Barbiturates: NOT DETECTED
Benzodiazepines: NOT DETECTED
Cocaine: NOT DETECTED
Opiates: NOT DETECTED
TETRAHYDROCANNABINOL: POSITIVE — AB

## 2014-09-15 LAB — BASIC METABOLIC PANEL
ANION GAP: 14 (ref 5–15)
BUN: 24 mg/dL — ABNORMAL HIGH (ref 6–23)
CO2: 25 mEq/L (ref 19–32)
Calcium: 8.5 mg/dL (ref 8.4–10.5)
Chloride: 113 mEq/L — ABNORMAL HIGH (ref 96–112)
Creatinine, Ser: 0.59 mg/dL (ref 0.50–1.10)
GFR calc Af Amer: >90 mL/min (ref >90)
Glucose, Bld: 156 mg/dL — ABNORMAL HIGH (ref 70–99)
POTASSIUM: 3.8 meq/L (ref 3.7–5.3)
Sodium: 152 mEq/L — ABNORMAL HIGH (ref 137–147)

## 2014-09-15 LAB — CBC
HCT: 35 % — ABNORMAL LOW (ref 36.0–46.0)
Hemoglobin: 11.2 g/dL — ABNORMAL LOW (ref 12.0–15.0)
MCH: 29.9 pg (ref 26.0–34.0)
MCHC: 32 g/dL (ref 30.0–36.0)
MCV: 93.3 fL (ref 78.0–100.0)
PLATELETS: 203 10*3/uL (ref 150–400)
RBC: 3.75 MIL/uL — AB (ref 3.87–5.11)
RDW: 15 % (ref 11.5–15.5)
WBC: 15.6 10*3/uL — AB (ref 4.0–10.5)

## 2014-09-15 MED ORDER — FREE WATER
200.0000 mL | Freq: Three times a day (TID) | Status: DC
  Administered 2014-09-15: 200 mL

## 2014-09-15 NOTE — Progress Notes (Signed)
PULMONARY / CRITICAL CARE MEDICINE   Name: Mindy Mills MRN: [id number] DOB: 1951-01-11    ADMISSION DATE:  09/11/2014 CONSULTATION DATE:  09/15/2014  REFERRING MD :  Kathyrn Sheriff  CHIEF COMPLAINT:  Seizure  INITIAL PRESENTATION: 63 y.o. F brought to [facility] ED on 9/15 after a witnessed syncopal episode followed by a grand mal seizure while in the car.  Upon ED arrival, pt was unresponsive and not moving her right side.  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  She was intubated and underwent endovascular procedure by Dr. Leighton Ruff.  PCCM was consulted. PMH - hepc, smoker, chronic headaches on narcotics  STUDIES:  CTA Head 9/15 >>> large SAH, 2.5 x 84mm right ACA aneurysm as source of SAH. Echo 9/16 >>EF 25-30%, apical akinesis- LAD infarction pattern.Moderate diastolic dysfunction .  RVSP 50  SIGNIFICANT EVENTS: 9/15 - admitted with [facility] and right ACA aneurysm, underwent endovascular repair, returned to ICU on vent. 9/15 - A com aneurysm coiled 9/16 difficulty with panda 9/18 OGT placed by GI   SUBJECTIVE: waking up off drips this am febrile Good UO off lasix  VITAL SIGNS: Temp:  [98.7 F (37.1 C)-101.4 F (38.6 C)] 99.5 F (37.5 C) (09/19 0337) Pulse Rate:  [63-90] 82 (09/19 0700) Resp:  [12-22] 22 (09/19 0700) BP: (101-139)/(60-97) 135/81 mmHg (09/19 0700) SpO2:  [91 %-100 %] 98 % (09/19 0739) FiO2 (%):  [50 %] 50 % (09/19 0739) Weight:  [60 kg (132 lb 4.4 oz)-61 kg (134 lb 7.7 oz)] 60 kg (132 lb 4.4 oz) (09/19 0500) HEMODYNAMICS: CVP:  [6 mmHg-18 mmHg] 10 mmHg VENTILATOR SETTINGS: Vent Mode:  [-] PRVC FiO2 (%):  [50 %] 50 % Set Rate:  [15 bmp] 15 bmp Vt Set:  [420 mL] 420 mL PEEP:  [5 cmH20] 5 cmH20 Pressure Support:  [10 cmH20] 10 cmH20 Plateau Pressure:  [10 cmH20-12 cmH20] 12 cmH20 INTAKE / OUTPUT: Intake/Output     09/18 0701 - 09/19 0700 09/19 0701 - 09/20 0700   I.V. (mL/kg) 244.4 (4.1)    Other 117.5    NG/GT 180    IV Piggyback 150    Total  Intake(mL/kg) 691.9 (11.5)    Urine (mL/kg/hr) 1525 (1.1)    Total Output 1525     Net -833.1            PHYSICAL EXAMINATION: General: Chronically ill appearing elderly female,  intubated, in NAD. Neuro: awake off drips, mild agitated, non-focal, RASS 0 to +1 HEENT: [place]/AT. PERRL, sclerae anicteric. Cardiovascular: RRR, no M/R/G.  Lungs: Respirations even and unlabored. BL decreased rhonchi Abdomen: BS x 4, soft, NT/ND.  Musculoskeletal: No gross deformities, no edema.  Skin: Intact, warm, no rashes.  LABS:  CBC  Recent Labs Lab 09/12/14 2300 09/14/14 0540 09/15/14 0520  WBC 21.3* 14.1* 15.6*  HGB 12.1 11.2* 11.2*  HCT 36.1 34.8* 35.0*  PLT 218 161 203   Coag's  Recent Labs Lab 09/11/14 2030  APTT 26  INR 1.04   BMET  Recent Labs Lab 09/12/14 2300 09/14/14 0540 09/15/14 0520  NA 142 149* 152*  K 3.2* 3.8 3.8  CL 105 110 113*  CO2 21 27 25   BUN 11 31* 24*  CREATININE 0.45* 0.85 0.59  GLUCOSE 139* 139* 156*   Electrolytes  Recent Labs Lab 09/12/14 0450 09/12/14 2300 09/14/14 0540 09/15/14 0520  CALCIUM 7.5* 8.5 8.4 8.5  MG 1.6  --   --   --   PHOS 2.2*  --   --   --  Sepsis Markers No results found for this basename: LATICACIDVEN, PROCALCITON, O2SATVEN,  in the last 168 hours  ABG  Recent Labs Lab 09/12/14 0459 09/12/14 1306 09/13/14 1313  PHART 7.303* 7.415 7.467*  PCO2ART 40.1 32.6* 37.2  PO2ART 96.8 49.0* 64.0*    Liver Enzymes  Recent Labs Lab 09/11/14 1304  AST 16  ALT 10  ALKPHOS 77  BILITOT 0.3  ALBUMIN 3.5   Cardiac Enzymes  Recent Labs Lab 09/11/14 2030 09/12/14 0215 09/12/14 0455 09/13/14 0524  TROPONINI 5.10* 5.40* 4.43* 1.62*  PROBNP 3137.0*  --   --   --     Glucose  Recent Labs Lab 09/14/14 0756 09/14/14 1210 09/14/14 1653 09/14/14 2002 09/14/14 2310 09/15/14 0336  GLUCAP 142* 143* 154* 138* 151* 139*    Imaging CXR - BL ASD -decreased edema pattern  ASSESSMENT /  PLAN:  PULMONARY OETT 9/15 >>>9/16, 9/17 >> A: VDRF - in setting of 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 Precedex gtt Keppra per neuro recs. Ct nimodipine  CARDIOVASCULAR A:  Hx HTN Incomplete LBBB on EKG, nstemi Acute Pulmonary edema on CXR , high BNP NSTEMI - LAD pattern WMA on echo P:  Cards consult Hold Lasix , CVP goal 10-12 now that in vasospasm range  RENAL A:   Hypokalemia Mild metabolic acidosis- resolved Hypernatremia   P:  Replace electrolytes as indicated. Add free water  GASTROINTESTINAL A:  Hx HCV Difficult panda placement P:   SUP: Pantoprazole. Npo for now - better placed ,only use OGT for nimotop  HEMATOLOGIC A:   VTE Prophylaxis P:  SCD's only.   INFECTIOUS A:  Aspiration pneumonia resp cult 9/17 >> P:   ct ceftx/ clinda  ENDOCRINE A:   Hyperglycemia  P:   CBG's q4hr. SSI.    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  CRITICAL CARE: The patient is critically ill with multiple organ systems failure and requires high complexity decision making for assessment and support, frequent evaluation and titration of therapies, application of advanced monitoring technologies and extensive interpretation of multiple databases. Critical Care Time devoted to  patient care services described in this note is 35 minutes.   Kara Mead MD. Shade Flood. [place] [facility] Pager [phone] If no response call [phone]   09/15/2014  7:48 AM

## 2014-09-15 NOTE — Progress Notes (Signed)
Pt seen and examined. No issues overnight. Had gastric tube placed yesterday. Remains intubated.  EXAM: Temp:  [97.9 F (36.6 C)-101.3 F (38.5 C)] 101.3 F (38.5 C) (09/19 1605) Pulse Rate:  [67-109] 97 (09/19 1600) Resp:  [14-25] 22 (09/19 1600) BP: (109-147)/(60-97) 144/85 mmHg (09/19 1600) SpO2:  [90 %-100 %] 98 % (09/19 1600) FiO2 (%):  [40 %-50 %] 50 % (09/19 1600) Weight:  [60 kg (132 lb 4.4 oz)] 60 kg (132 lb 4.4 oz) (09/19 0500) Intake/Output     09/18 0701 - 09/19 0700 09/19 0701 - 09/20 0700   I.V. (mL/kg) 244.4 (4.1) 131.1 (2.2)   Other 117.5 90   NG/GT 180 200   IV Piggyback 150 150   Total Intake(mL/kg) 691.9 (11.5) 571.1 (9.5)   Urine (mL/kg/hr) 1525 (1.1) 600 (1)   Total Output 1525 600   Net -833.1 -28.9         On low-dose precedex gtt: Opens eyes spontaneously Tracks Nods to questions Follows commands all extremities  LABS: Lab Results  Component Value Date   CREATININE 0.59 09/15/2014   BUN 24* 09/15/2014   NA 152* 09/15/2014   K 3.8 09/15/2014   CL 113* 09/15/2014   CO2 25 09/15/2014   Lab Results  Component Value Date   WBC 15.6* 09/15/2014   HGB 11.2* 09/15/2014   HCT 35.0* 09/15/2014   MCV 93.3 09/15/2014   PLT 203 09/15/2014    TCD: Velocities remain WNL  IMPRESSION: - 63 y.o. female [place] d# 5 s/p Acom coiling, appears neurologically stable - Stress cardiomyopathy  PLAN: - Extubate when able from airway/pulmonary standpoint - Will be difficult to manage fluid status given need for euvolemia or hypervolemia for spasm but diastolic dysfunction and risk of fluid overload. - Cont Nimotop/Zocor and TCD monitoring

## 2014-09-15 NOTE — Progress Notes (Signed)
[facility] Progress Note Patient Name: Lizabeth Fellner DOB: 03/09/1951 MRN: [id number]   Date of Service  09/15/2014  HPI/Events of Note   Diarrhea.   eICU Interventions   Check C. Dif      Intervention Category Minor Interventions: OtherLowry Ram, Marielouise Amey R. 09/15/2014, 9:45 PM

## 2014-09-15 NOTE — Progress Notes (Signed)
Patient Name: Mindy Mills Date of Encounter: 09/15/2014  Active Problems:   Subarachnoid hemorrhage due to ruptured aneurysm   Acute respiratory failure   Hypokalemia   Altered mental status   Subarachnoid hemorrhage   Length of Stay: 4  SUBJECTIVE  Remains intubated and sedated. No further ventricular arrhythmia, occ PACs. Marked improvement in QT interval and overall repol abnormalities. No reliable enteral access, feeding tube looped up. Mild pulmonary interstitial edema by CXR. No real problems oxygenating. Balanced in/out fluid  CURRENT MEDS . antiseptic oral rinse  7 mL Mouth Rinse QID  . budesonide  0.5 mg Nebulization BID  . cefTRIAXone (ROCEPHIN)  IV  1 g Intravenous Q24H  . chlorhexidine  15 mL Mouth Rinse BID  . fentaNYL  50 mcg Intravenous Once  . free water  200 mL Per Tube 3 times per day  . haloperidol lactate  5 mg Intravenous Once  . ipratropium-albuterol  3 mL Nebulization Q4H  . levETIRAcetam  500 mg Intravenous Q12H  . methylPREDNISolone (SOLU-MEDROL) injection  40 mg Intravenous 3 times per day  . niMODipine  60 mg Oral Q4H   Or  . NiMODipine  60 mg Per Tube Q4H  . pantoprazole (PROTONIX) IV  40 mg Intravenous QHS    OBJECTIVE   Intake/Output Summary (Last 24 hours) at 09/15/14 1158 Last data filed at 09/15/14 1003  Gross per 24 hour  Intake  755.2 ml  Output   1750 ml  Net -994.8 ml   Filed Weights   09/14/14 0425 09/14/14 1300 09/15/14 0500  Weight: 63.3 kg (139 lb 8.8 oz) 61 kg (134 lb 7.7 oz) 60 kg (132 lb 4.4 oz)    PHYSICAL EXAM Filed Vitals:   09/15/14 1000 09/15/14 1025 09/15/14 1100 09/15/14 1143  BP: 131/63  142/81   Pulse: 94 98 89   Temp:    97.9 F (36.6 C)  TempSrc:    Axillary  Resp: 19 20 18    Height:      Weight:      SpO2: 94% 98% 90%    General: intubated, sedated, easy to awake Head: no evidence of trauma, PERRL, EOMI, no exophtalmos or lid lag, no myxedema, no xanthelasma; normal ears, nose and  oropharynx Neck: normal jugular venous pulsations and no hepatojugular reflux; brisk carotid pulses without delay and no carotid bruits Chest: clear to auscultation, no signs of consolidation by percussion or palpation, normal fremitus, symmetrical and full respiratory excursions Cardiovascular: normal position and quality of the apical impulse, regular rhythm, normal first and second heart sounds, no rubs or gallops, no murmur Abdomen: no tenderness or distention, no masses by palpation, no abnormal pulsatility or arterial bruits, normal bowel sounds, no hepatosplenomegaly Extremities: no clubbing, cyanosis or edema; 2+ radial, ulnar and brachial pulses bilaterally; 2+ right femoral, posterior tibial and dorsalis pedis pulses; 2+ left femoral, posterior tibial and dorsalis pedis pulses; no subclavian or femoral bruits   LABS  CBC  Recent Labs  09/14/14 0540 09/15/14 0520  WBC 14.1* 15.6*  HGB 11.2* 11.2*  HCT 34.8* 35.0*  MCV 93.8 93.3  PLT 161 469   Basic Metabolic Panel  Recent Labs  09/14/14 0540 09/15/14 0520  NA 149* 152*  K 3.8 3.8  CL 110 113*  CO2 27 25  GLUCOSE 139* 156*  BUN 31* 24*  CREATININE 0.85 0.59  CALCIUM 8.4 8.5   Liver Function Tests No results found for this basename: AST, ALT, ALKPHOS, BILITOT, PROT, ALBUMIN,  in the last  72 hours No results found for this basename: LIPASE, AMYLASE,  in the last 72 hours Cardiac Enzymes  Recent Labs  09/13/14 0524  TROPONINI 1.62*   Radiology Studies   TELE NSR, PACs  ECG Improved QTc still prolonged  ASSESSMENT AND PLAN ECG evolution c/w SAH-related "cerebral T waves" / stress cardiomyopathy. Recheck echo in a few days - if EF normal would fit this diagnosis and may not need a cardiac cath. She is a little "wet", but avoid diuretics and reduction in CO - no diuretics now, until risk of cerebral spasm is over.   Sanda Klein, MD, [facility] [phone] office [phone]  pager 09/15/2014 11:58 AM

## 2014-09-16 ENCOUNTER — Inpatient Hospital Stay (HOSPITAL_COMMUNITY): Payer: Medicare Other

## 2014-09-16 LAB — BASIC METABOLIC PANEL
ANION GAP: 12 (ref 5–15)
Anion gap: 11 (ref 5–15)
BUN: 23 mg/dL (ref 6–23)
BUN: 20 mg/dL (ref 6–23)
CALCIUM: 8.5 mg/dL (ref 8.4–10.5)
CALCIUM: 8.3 mg/dL — AB (ref 8.4–10.5)
CO2: 26 mEq/L (ref 19–32)
CO2: 24 mEq/L (ref 19–32)
CREATININE: 0.58 mg/dL (ref 0.50–1.10)
CREATININE: 0.5 mg/dL (ref 0.50–1.10)
Chloride: 117 mEq/L — ABNORMAL HIGH (ref 96–112)
Chloride: 118 mEq/L — ABNORMAL HIGH (ref 96–112)
GFR calc Af Amer: >90 mL/min (ref >90)
GFR calc non Af Amer: >90 mL/min (ref >90)
Glucose, Bld: 165 mg/dL — ABNORMAL HIGH (ref 70–99)
Glucose, Bld: 157 mg/dL — ABNORMAL HIGH (ref 70–99)
Potassium: 3.9 mEq/L (ref 3.7–5.3)
Potassium: 3.3 mEq/L — ABNORMAL LOW (ref 3.7–5.3)
SODIUM: 154 meq/L — AB (ref 137–147)
Sodium: 154 mEq/L — ABNORMAL HIGH (ref 137–147)

## 2014-09-16 LAB — POCT I-STAT 3, ART BLOOD GAS (G3+)
Acid-base deficit: 1 mmol/L (ref 0.0–2.0)
Bicarbonate: 24.3 mEq/L — ABNORMAL HIGH (ref 20.0–24.0)
O2 Saturation: 96 %
PH ART: 7.393 (ref 7.350–7.450)
TCO2: 26 mmol/L (ref 0–100)
pCO2 arterial: 39.9 mmHg (ref 35.0–45.0)
pO2, Arterial: 80 mmHg (ref 80.0–100.0)

## 2014-09-16 LAB — CBC
HCT: 34.8 % — ABNORMAL LOW (ref 36.0–46.0)
Hemoglobin: 11 g/dL — ABNORMAL LOW (ref 12.0–15.0)
MCH: 30 pg (ref 26.0–34.0)
MCHC: 31.6 g/dL (ref 30.0–36.0)
MCV: 94.8 fL (ref 78.0–100.0)
Platelets: 198 10*3/uL (ref 150–400)
RBC: 3.67 MIL/uL — ABNORMAL LOW (ref 3.87–5.11)
RDW: 15.1 % (ref 11.5–15.5)
WBC: 12.1 10*3/uL — AB (ref 4.0–10.5)

## 2014-09-16 LAB — CLOSTRIDIUM DIFFICILE BY PCR: CDIFFPCR: NEGATIVE

## 2014-09-16 LAB — GLUCOSE, CAPILLARY
Glucose-Capillary: 135 mg/dL — ABNORMAL HIGH (ref 70–99)
Glucose-Capillary: 135 mg/dL — ABNORMAL HIGH (ref 70–99)
Glucose-Capillary: 143 mg/dL — ABNORMAL HIGH (ref 70–99)
Glucose-Capillary: 143 mg/dL — ABNORMAL HIGH (ref 70–99)
Glucose-Capillary: 155 mg/dL — ABNORMAL HIGH (ref 70–99)
Glucose-Capillary: 159 mg/dL — ABNORMAL HIGH (ref 70–99)

## 2014-09-16 MED ORDER — DEXTROSE 5 % IV SOLN
INTRAVENOUS | Status: DC
  Administered 2014-09-17 – 2014-09-20 (×2): via INTRAVENOUS
  Administered 2014-09-21: 10 mL via INTRAVENOUS
  Administered 2014-09-24: 06:00:00 via INTRAVENOUS

## 2014-09-16 MED ORDER — FUROSEMIDE 10 MG/ML IJ SOLN
40.0000 mg | Freq: Four times a day (QID) | INTRAMUSCULAR | Status: AC
  Administered 2014-09-16 – 2014-09-17 (×2): 40 mg via INTRAVENOUS
  Filled 2014-09-16 (×2): qty 4

## 2014-09-16 MED ORDER — ACETAMINOPHEN 160 MG/5ML PO SOLN
650.0000 mg | ORAL | Status: DC | PRN
  Administered 2014-09-17 – 2014-09-18 (×2): 650 mg
  Filled 2014-09-16 (×2): qty 20.3

## 2014-09-16 MED ORDER — DEXTROSE-NACL 5-0.45 % IV SOLN
INTRAVENOUS | Status: DC
  Administered 2014-09-16: 07:00:00 via INTRAVENOUS

## 2014-09-16 MED ORDER — METHYLPREDNISOLONE SODIUM SUCC 40 MG IJ SOLR
40.0000 mg | Freq: Two times a day (BID) | INTRAMUSCULAR | Status: DC
  Administered 2014-09-16 – 2014-09-24 (×16): 40 mg via INTRAVENOUS
  Filled 2014-09-16 (×18): qty 1

## 2014-09-16 MED ORDER — IPRATROPIUM-ALBUTEROL 0.5-2.5 (3) MG/3ML IN SOLN
3.0000 mL | Freq: Four times a day (QID) | RESPIRATORY_TRACT | Status: DC
  Administered 2014-09-17 – 2014-09-24 (×28): 3 mL via RESPIRATORY_TRACT
  Filled 2014-09-16 (×30): qty 3

## 2014-09-16 MED ORDER — BUDESONIDE 0.5 MG/2ML IN SUSP
0.5000 mg | Freq: Two times a day (BID) | RESPIRATORY_TRACT | Status: DC
  Administered 2014-09-16 – 2014-09-24 (×17): 0.5 mg via RESPIRATORY_TRACT
  Filled 2014-09-16 (×19): qty 2

## 2014-09-16 NOTE — Progress Notes (Signed)
Patient ID: Mindy Mills, female   DOB: [birth year], 63 y.o.   MRN: [id number] Neuro doing much better. Moves all4 extremities, talking, f/c

## 2014-09-16 NOTE — Progress Notes (Signed)
Pt's 02 sats in the mid 80s on [place] 4L.  [place] increased to 6L without improvement.  Pt not complaining of pain or shortness of breath.  Pt placed on venti mask at 55% on 14L with 02 sats increasing to 89-90%.  MD (CCM- Joya Gaskins) notified.  Orders given for Bipap and stat PCXR.  RT at bedside.  Bipap applied and pt's 02 sats increased to upper 90s.  Will continue to monitor.

## 2014-09-16 NOTE — Progress Notes (Signed)
PULMONARY / CRITICAL CARE MEDICINE   Name: Mindy Mills MRN: [id number] DOB: Jan 12, 1951    ADMISSION DATE:  09/11/2014 CONSULTATION DATE:  09/16/2014  REFERRING MD :  Kathyrn Sheriff  CHIEF COMPLAINT:  Seizure  INITIAL PRESENTATION: 63 y.o. F brought to [facility] ED on 9/15 after a witnessed syncopal episode followed by a grand mal seizure while in the car.  Upon ED arrival, pt was unresponsive and not moving her right side.  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  She was intubated and underwent A com aneurysm coiling by  Dr. Leighton Ruff.  PCCM was consulted. She was reintubated 9/17 for pulmonary edema & hypoxia induced delerium PMH - hepc, smoker, chronic headaches on narcotics  STUDIES:  CTA Head 9/15 >>> large SAH, 2.5 x 74mm right ACA aneurysm as source of SAH. Echo 9/16 >>EF 25-30%, apical akinesis- LAD infarction pattern.Moderate diastolic dysfunction .  RVSP 50  SIGNIFICANT EVENTS: 9/15 - admitted with [facility] and right ACA aneurysm, underwent endovascular repair, returned to ICU on vent. 9/15 - A com aneurysm coiled 9/16 difficulty with panda 9/18 OGT placed by GI   SUBJECTIVE: Awake off drips this am febrile Good UO off lasix CVP 1-12 range  VITAL SIGNS: Temp:  [98.1 F (36.7 C)-101.3 F (38.5 C)] 98.3 F (36.8 C) (09/20 1152) Pulse Rate:  [69-103] 73 (09/20 1100) Resp:  [13-23] 14 (09/20 1300) BP: (101-144)/(44-111) 140/75 mmHg (09/20 1300) SpO2:  [94 %-100 %] 94 % (09/20 1300) FiO2 (%):  [40 %-50 %] 40 % (09/20 0900) Weight:  [58.9 kg (129 lb 13.6 oz)] 58.9 kg (129 lb 13.6 oz) (09/20 0500) HEMODYNAMICS: CVP:  [8 mmHg-13 mmHg] 11 mmHg VENTILATOR SETTINGS: Vent Mode:  [-] PSV;CPAP FiO2 (%):  [40 %-50 %] 40 % Set Rate:  [15 bmp] 15 bmp Vt Set:  [420 mL] 420 mL PEEP:  [5 cmH20-8 cmH20] 5 cmH20 Pressure Support:  [5 cmH20] 5 cmH20 Plateau Pressure:  [14 cmH20-17 cmH20] 14 cmH20 INTAKE / OUTPUT: Intake/Output     09/19 0701 - 09/20 0700 09/20 0701 - 09/21 0700    I.V. (mL/kg) 309.6 (5.3) 300 (5.1)   Other 230    NG/GT 500    IV Piggyback 250 150   Total Intake(mL/kg) 1289.6 (21.9) 450 (7.6)   Urine (mL/kg/hr) 1500 (1.1) 250 (0.6)   Total Output 1500 250   Net -210.4 +200        Stool Occurrence 5 x      PHYSICAL EXAMINATION: General: Chronically ill appearing elderly female,  intubated, in NAD. Neuro: awake off drips, mild agitated, non-focal, RASS 0 to +1 HEENT: [place]/AT. PERRL, sclerae anicteric. Cardiovascular: RRR, no M/R/G.  Lungs: Respirations even and unlabored. BL decreased, no rhonchi Abdomen: BS x 4, soft, NT/ND.  Musculoskeletal: No gross deformities, no edema.  Skin: Intact, warm, no rashes.  LABS:  CBC  Recent Labs Lab 09/14/14 0540 09/15/14 0520 09/16/14 0415  WBC 14.1* 15.6* 12.1*  HGB 11.2* 11.2* 11.0*  HCT 34.8* 35.0* 34.8*  PLT 161 203 198   Coag's  Recent Labs Lab 09/11/14 2030  APTT 26  INR 1.04   BMET  Recent Labs Lab 09/14/14 0540 09/15/14 0520 09/16/14 0415  NA 149* 152* 154*  K 3.8 3.8 3.9  CL 110 113* 117*  CO2 27 25 26   BUN 31* 24* 23  CREATININE 0.85 0.59 0.58  GLUCOSE 139* 156* 165*   Electrolytes  Recent Labs Lab 09/12/14 0450  09/14/14 0540 09/15/14 0520 09/16/14 4098  CALCIUM 7.5*  < > 8.4 8.5 8.5  MG 1.6  --   --   --   --   PHOS 2.2*  --   --   --   --   < > = values in this interval not displayed. Sepsis Markers No results found for this basename: LATICACIDVEN, PROCALCITON, O2SATVEN,  in the last 168 hours  ABG  Recent Labs Lab 09/12/14 1306 09/13/14 1313 09/16/14 0840  PHART 7.415 7.467* 7.393  PCO2ART 32.6* 37.2 39.9  PO2ART 49.0* 64.0* 80.0    Liver Enzymes  Recent Labs Lab 09/11/14 1304  AST 16  ALT 10  ALKPHOS 77  BILITOT 0.3  ALBUMIN 3.5   Cardiac Enzymes  Recent Labs Lab 09/11/14 2030 09/12/14 0215 09/12/14 0455 09/13/14 0524  TROPONINI 5.10* 5.40* 4.43* 1.62*  PROBNP 3137.0*  --   --   --     Glucose  Recent Labs Lab  09/15/14 1602 09/15/14 2003 09/15/14 2358 09/16/14 0401 09/16/14 0813 09/16/14 1150  GLUCAP 152* 159* 155* 159* 135* 135*    Imaging CXR - BL ASD -decreased edema pattern  ASSESSMENT / PLAN:  PULMONARY OETT 9/15 >>>9/16, 9/17 >> A: VDRF - in setting of 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 Precedex gtt -can taper to off  Keppra per neuro recs. Ct nimodipine  CARDIOVASCULAR A:  Hx HTN Incomplete LBBB on EKG, nstemi Acute Pulmonary edema  NSTEMI - LAD pattern WMA on echo vs Takatsubo's P:  Cards consult Hold Lasix , CVP goal 10-12 now that in vasospasm range  RENAL A:   Hypokalemia Mild metabolic acidosis- resolved Hypernatremia   P:  Replace electrolytes as indicated. D5W @ 50/h  GASTROINTESTINAL A:  Hx HCV Difficult panda placement P:   SUP: Pantoprazole.   HEMATOLOGIC A:   VTE Prophylaxis P:  SCD's only.   INFECTIOUS A:  Aspiration pneumonia resp cult 9/17 >> P:   ct ceftx/ clinda  ENDOCRINE A:   Hyperglycemia  P:   CBG's q4hr. SSI.    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 stress cardiomyopathy)in this smoker with likely COPD -re-intubated   I have personally obtained a history, examined the patient, evaluated laboratory and imaging results, formulated the assessment and plan and placed orders.  CRITICAL CARE: The patient is critically ill with multiple organ systems failure and requires high complexity decision making for assessment and support, frequent evaluation and titration of therapies, application of advanced monitoring technologies  and extensive interpretation of multiple databases. Critical Care Time devoted to patient care services described in this note is 35 minutes.   Kara Mead MD. [place] Flood. [facility] Pager [phone] If no response call [phone]   09/16/2014  2:11 PM

## 2014-09-16 NOTE — Progress Notes (Signed)
[facility] Progress Note Patient Name: Mindy Mills DOB: Aug 30, 1951 MRN: [id number]   Date of Service  09/16/2014  HPI/Events of Note  Pt now desaturating, acute pulm edema on cxr  eICU Interventions  ivf kvo, give lasix      Intervention Category Major Interventions: Respiratory failure - evaluation and management  Asencion Noble 09/16/2014, 10:35 PM

## 2014-09-16 NOTE — Procedures (Signed)
Extubation Procedure Note  Patient Details:   Name: Mindy Mills DOB: 1951-07-06 MRN: [id number]   Airway Documentation:     Evaluation  O2 sats: stable throughout Complications: No apparent complications Patient did tolerate procedure well. Bilateral Breath Sounds: Clear;Diminished Suctioning: Oral Yes.  Extubated per Dr. Bari Mantis orders.  Placed on 5L sats are 98.  Instructed on IS.  Abrahm Mancia V 09/16/2014, 9:47 AM

## 2014-09-16 NOTE — Progress Notes (Signed)
Patient Name: Mindy Mills Date of Encounter: 09/16/2014  Active Problems:   Subarachnoid hemorrhage due to ruptured aneurysm   Acute respiratory failure   Hypokalemia   Altered mental status   Subarachnoid hemorrhage   Length of Stay: 5  SUBJECTIVE  Just extubated, looks comfortable,but on 5L O2  CURRENT MEDS . antiseptic oral rinse  7 mL Mouth Rinse QID  . budesonide  0.5 mg Nebulization BID  . cefTRIAXone (ROCEPHIN)  IV  1 g Intravenous Q24H  . chlorhexidine  15 mL Mouth Rinse BID  . haloperidol lactate  5 mg Intravenous Once  . ipratropium-albuterol  3 mL Nebulization Q4H  . levETIRAcetam  500 mg Intravenous Q12H  . methylPREDNISolone (SOLU-MEDROL) injection  40 mg Intravenous 3 times per day  . niMODipine  60 mg Oral Q4H   Or  . NiMODipine  60 mg Per Tube Q4H  . pantoprazole (PROTONIX) IV  40 mg Intravenous QHS    OBJECTIVE   Intake/Output Summary (Last 24 hours) at 09/16/14 1103 Last data filed at 09/16/14 0900  Gross per 24 hour  Intake 1045.2 ml  Output   1025 ml  Net   20.2 ml   Filed Weights   09/14/14 1300 09/15/14 0500 09/16/14 0500  Weight: 61 kg (134 lb 7.7 oz) 60 kg (132 lb 4.4 oz) 58.9 kg (129 lb 13.6 oz)    PHYSICAL EXAM Filed Vitals:   09/16/14 0700 09/16/14 0741 09/16/14 0800 09/16/14 0900  BP: 108/64  114/73 120/71  Pulse: 73  74 69  Temp:   98.1 F (36.7 C)   TempSrc:   Oral   Resp: 17  19 14   Height:      Weight:      SpO2: 100% 100% 100% 99%   General: Sleepy, no distress Head: no evidence of trauma, PERRL, EOMI, no exophtalmos or lid lag, no myxedema, no xanthelasma; normal ears, nose and oropharynx Neck: normal jugular venous pulsations and no hepatojugular reflux; brisk carotid pulses without delay and no carotid bruits Chest: clear to auscultation, no signs of consolidation by percussion or palpation, normal fremitus, symmetrical and full respiratory excursions Cardiovascular: normal position and quality of the apical  impulse, regular rhythm, normal first and second heart sounds, no rubs or gallops, no murmur Abdomen: no tenderness or distention, no masses by palpation, no abnormal pulsatility or arterial bruits, normal bowel sounds, no hepatosplenomegaly Extremities: no clubbing, cyanosis or edema; 2+ radial, ulnar and brachial pulses bilaterally; 2+ right femoral, posterior tibial and dorsalis pedis pulses; 2+ left femoral, posterior tibial and dorsalis pedis pulses; no subclavian or femoral bruits   LABS  CBC  Recent Labs  09/15/14 0520 09/16/14 0415  WBC 15.6* 12.1*  HGB 11.2* 11.0*  HCT 35.0* 34.8*  MCV 93.3 94.8  PLT 203 382   Basic Metabolic Panel  Recent Labs  09/15/14 0520 09/16/14 0415  NA 152* 154*  K 3.8 3.9  CL 113* 117*  CO2 25 26  GLUCOSE 156* 165*  BUN 24* 23  CREATININE 0.59 0.58  CALCIUM 8.5 8.5    Radiology Studies Imaging results have been reviewed and Dg Chest Port 1 View  09/16/2014   CLINICAL DATA:  Ventilator dependent respiratory failure. Followup interstitial edema. Subarachnoid hemorrhage secondary to aneurysm rupture this admission.  EXAM: PORTABLE CHEST - 1 VIEW  COMPARISON:  Portable chest x-rays 09/14/2014 dating back to 09/11/2014.  FINDINGS: Endotracheal tube tip in satisfactory position projecting approximately 6-7 cm above the carina. Right subclavian central venous catheter  tip projects over the mid SVC, unchanged. Nasogastric tube looped in the stomach with its tip in the distal esophagus, though the side hole does appear to lie within the proximal stomach.  Cardiac silhouette mildly enlarged but stable. Interstitial opacities throughout both lungs most consistent with edema, slightly improved since yesterday. No new pulmonary parenchymal abnormalities.  IMPRESSION: 1. Nasogastric tube looped in the stomach with its tip up in the distal esophagus. The side hole appears to be within the proximal stomach. 2. Remaining support apparatus satisfactory. 3.  Interval slight improvement in interstitial and airspace edema, though mild edema persists. 4. No new abnormalities.   Electronically Signed   By: Evangeline Dakin M.D.   On: 09/16/2014 07:28   Dg Chest Port 1 View  09/15/2014   CLINICAL DATA:  Pneumonitis.  EXAM: PORTABLE CHEST - 1 VIEW  COMPARISON:  09/14/2014  FINDINGS: Endotracheal tube remains in place with tip approximately 3.5 cm above the carina. Right subclavian central venous catheter remains with tip overlying the lower SVC. Enteric tube is incompletely visualized but looped on itself with tip directed cranially overlying the lower mediastinum. Mild interstitial pulmonary edema does not appear significantly changed. No definite pleural effusion or pneumothorax is identified. Old left clavicle fracture is noted.  IMPRESSION: 1. Endotracheal tube 3.5 cm above the carina. 2. Unchanged appearance of interstitial edema. 3. Enteric tube looped on itself with tip over the lower mediastinum. Please see report from concurrent abdominal radiograph for further evaluation.   Electronically Signed   By: Logan Bores   On: 09/15/2014 07:56   Dg Abd Portable 1v  09/15/2014   CLINICAL DATA:  Evaluate OG tube placement  EXAM: PORTABLE ABDOMEN - 1 VIEW  COMPARISON:  09/14/2014 (multiple prior radiographs); chest radiograph-earlier same day  FINDINGS: Interval advancement of enteric however the tip now points retrograde overlying the distal esophagus with side-port overlies the expected location of the gastroesophageal junction.  Nonobstructive bowel-gas pattern. No supine evidence of pneumoperitoneum. No pneumatosis or portal venous gas.  Limited visualization lower thorax demonstrates the tip of a central venous catheter overlying the superior cavoatrial junction. Grossly unchanged enlarged cardiac silhouette. Ill-defined heterogeneous nodular opacities are unchanged.  No acute osseus abnormalities.  IMPRESSION: 1. Interval advancement of enteric tube however tip  and side-port overlying the distal esophagus and gastroesophageal junction. Repositioning is advised. 2. Nonobstructive bowel gas pattern.   Electronically Signed   By: Sandi Mariscal M.D.   On: 09/15/2014 07:54   Dg Abd Portable 1v  09/14/2014   CLINICAL DATA:  Advancement of the orogastric tube  EXAM: PORTABLE ABDOMEN - 1 VIEW  COMPARISON:  Portable chest x-ray of today's date at 8:33 p.m.  FINDINGS: The orogastric tube tip lies at the level of the GE junction approximately 2 cm more distal than on the previous study. The proximal port remains above the level of the GE junction.  IMPRESSION: The nasogastric tube has been advanced approximately 2 cm more distally than on the previous study. Advancement by an additional 10 cm is recommended to assure that the proximal port lies well below the GE junction.   Electronically Signed   By: David  Martinique   On: 09/14/2014 20:54   Dg Abd Portable 1v  09/14/2014   CLINICAL DATA:  Orogastric tube placement  EXAM: PORTABLE ABDOMEN - 1 VIEW  COMPARISON:  09/14/2014 at 2023 hr  FINDINGS: Orogastric tube tip now lies in the distal esophagus just above the gastroesophageal junction.  IMPRESSION: Orogastric tube tip is  in the distal esophagus. Tube no longer curls on itself. It will need to be inserted at least 15 cm to allow the tip to fully into the stomach.   Electronically Signed   By: Lajean Manes M.D.   On: 09/14/2014 20:45   Dg Abd Portable 1v  09/14/2014   CLINICAL DATA:  Repositioning of nasogastric to.  EXAM: PORTABLE ABDOMEN - 1 VIEW 2023 hr:  COMPARISON:  Portable abdomen x-ray earlier same date 1916 hr.  FINDINGS: Nasogastric tube remains looped in the proximal stomach with its tip back up in the distal esophagus. Visualized bowel gas pattern unremarkable.  IMPRESSION: Nasogastric tube looped in the proximal stomach with its tip back up in the distal esophagus. It may be difficult to eliminate the loop without removing and replacing the tube.  Results were  telephoned to [facility], the nurse caring for the patient in the neuro ICU at the time of interpretation on 09/14/2014 at 2035 hr.   Electronically Signed   By: Evangeline Dakin M.D.   On: 09/14/2014 20:36   Dg Abd Portable 1v  09/14/2014   CLINICAL DATA:  Orogastric tube placement.  EXAM: PORTABLE ABDOMEN - 1 VIEW  COMPARISON:  09/14/2014 at 5:42 a.m.  FINDINGS: Orogastric tube passes below the diaphragm into the stomach but thin curls upon itself to have the tip extend back into the distal esophagus.  Patchy areas of airspace and interstitial opacity noted in the lungs, without change. Normal bowel gas pattern.  IMPRESSION: 1. Orogastric tube curls on itself within the stomach to have its tip extend back into the distal esophagus.   Electronically Signed   By: Lajean Manes M.D.   On: 09/14/2014 19:31    TELE NSR  ECG NSR, LVH. ST-T changes gradually improving and QTc almost back to normal.  ASSESSMENT AND PLAN Repeat echo tomorrow. Suspect improved LVEF / recovering SAH-related cardiomyopathy. BP rather low for recent [facility], will refrain from additional CHF meds/diuetics until echo reviewed.   Sanda Klein, MD, [facility] [phone] office [phone] pager 09/16/2014 11:03 AM

## 2014-09-16 NOTE — Progress Notes (Signed)
Pt. Extubated at [place] without difficulty. Pt. Tolerated action well. Restraints removed. Oriented, following commands, and no longer interfering with lines, drains, or care. Restraint orders discontinued after removal of restraints. Will continue to monitor.

## 2014-09-17 ENCOUNTER — Inpatient Hospital Stay (HOSPITAL_COMMUNITY): Payer: Medicare Other

## 2014-09-17 DIAGNOSIS — I2589 Other forms of chronic ischemic heart disease: Secondary | ICD-10-CM

## 2014-09-17 DIAGNOSIS — I2489 Other forms of acute ischemic heart disease: Secondary | ICD-10-CM

## 2014-09-17 DIAGNOSIS — I248 Other forms of acute ischemic heart disease: Secondary | ICD-10-CM

## 2014-09-17 DIAGNOSIS — I429 Cardiomyopathy, unspecified: Secondary | ICD-10-CM | POA: Diagnosis present

## 2014-09-17 DIAGNOSIS — I428 Other cardiomyopathies: Secondary | ICD-10-CM

## 2014-09-17 LAB — CBC
HEMATOCRIT: 40.6 % (ref 36.0–46.0)
HEMOGLOBIN: 12.8 g/dL (ref 12.0–15.0)
MCH: 30.2 pg (ref 26.0–34.0)
MCHC: 31.5 g/dL (ref 30.0–36.0)
MCV: 95.8 fL (ref 78.0–100.0)
Platelets: 274 10*3/uL (ref 150–400)
RBC: 4.24 MIL/uL (ref 3.87–5.11)
RDW: 14.9 % (ref 11.5–15.5)
WBC: 19.1 10*3/uL — ABNORMAL HIGH (ref 4.0–10.5)

## 2014-09-17 LAB — BLOOD GAS, ARTERIAL
ACID-BASE EXCESS: 3.6 mmol/L — AB (ref 0.0–2.0)
BICARBONATE: 27.4 meq/L — AB (ref 20.0–24.0)
Drawn by: 313941
FIO2: 1 %
O2 Saturation: 99.9 %
PCO2 ART: 42.2 mmHg (ref 35.0–45.0)
PEEP: 5 cmH2O
PH ART: 7.434 (ref 7.350–7.450)
PO2 ART: 230 mmHg — AB (ref 80.0–100.0)
Patient temperature: 100.6
RATE: 14 resp/min
TCO2: 28.6 mmol/L (ref 0–100)
VT: 500 mL

## 2014-09-17 LAB — URINALYSIS, ROUTINE W REFLEX MICROSCOPIC
BILIRUBIN URINE: NEGATIVE
Glucose, UA: NEGATIVE mg/dL
KETONES UR: NEGATIVE mg/dL
Leukocytes, UA: NEGATIVE
NITRITE: NEGATIVE
PH: 6 (ref 5.0–8.0)
Protein, ur: NEGATIVE mg/dL
Specific Gravity, Urine: 1.015 (ref 1.005–1.030)
Urobilinogen, UA: 0.2 mg/dL (ref 0.0–1.0)

## 2014-09-17 LAB — GLUCOSE, CAPILLARY
GLUCOSE-CAPILLARY: 122 mg/dL — AB (ref 70–99)
GLUCOSE-CAPILLARY: 127 mg/dL — AB (ref 70–99)
Glucose-Capillary: 145 mg/dL — ABNORMAL HIGH (ref 70–99)
Glucose-Capillary: 149 mg/dL — ABNORMAL HIGH (ref 70–99)
Glucose-Capillary: 152 mg/dL — ABNORMAL HIGH (ref 70–99)
Glucose-Capillary: 170 mg/dL — ABNORMAL HIGH (ref 70–99)

## 2014-09-17 LAB — BASIC METABOLIC PANEL
Anion gap: 14 (ref 5–15)
BUN: 19 mg/dL (ref 6–23)
CO2: 29 mEq/L (ref 19–32)
CREATININE: 0.6 mg/dL (ref 0.50–1.10)
Calcium: 8.8 mg/dL (ref 8.4–10.5)
Chloride: 109 mEq/L (ref 96–112)
GFR calc Af Amer: >90 mL/min (ref >90)
Glucose, Bld: 148 mg/dL — ABNORMAL HIGH (ref 70–99)
Potassium: 3.1 mEq/L — ABNORMAL LOW (ref 3.7–5.3)
SODIUM: 152 meq/L — AB (ref 137–147)

## 2014-09-17 LAB — URINE MICROSCOPIC-ADD ON: RBC / HPF: NONE SEEN RBC/hpf (ref <3)

## 2014-09-17 LAB — MAGNESIUM: Magnesium: 1.8 mg/dL (ref 1.5–2.5)

## 2014-09-17 LAB — PHOSPHORUS: PHOSPHORUS: 4.1 mg/dL (ref 2.3–4.6)

## 2014-09-17 MED ORDER — PROPOFOL 10 MG/ML IV EMUL
INTRAVENOUS | Status: AC
  Administered 2014-09-17: 10 ug/kg/min via INTRAVENOUS
  Filled 2014-09-17: qty 100

## 2014-09-17 MED ORDER — POTASSIUM CHLORIDE 10 MEQ/50ML IV SOLN
10.0000 meq | INTRAVENOUS | Status: AC
  Administered 2014-09-17 (×4): 10 meq via INTRAVENOUS
  Filled 2014-09-17 (×4): qty 50

## 2014-09-17 MED ORDER — MIDAZOLAM HCL 2 MG/2ML IJ SOLN
INTRAMUSCULAR | Status: AC
  Administered 2014-09-17: 2 mg via INTRAVENOUS
  Filled 2014-09-17: qty 4

## 2014-09-17 MED ORDER — PROPOFOL 10 MG/ML IV EMUL
5.0000 ug/kg/min | INTRAVENOUS | Status: DC
  Administered 2014-09-17 – 2014-09-19 (×3): 10 ug/kg/min via INTRAVENOUS
  Filled 2014-09-17: qty 100

## 2014-09-17 MED ORDER — ETOMIDATE 2 MG/ML IV SOLN
20.0000 mg | Freq: Once | INTRAVENOUS | Status: AC
  Administered 2014-09-17: 20 mg via INTRAVENOUS

## 2014-09-17 MED ORDER — VANCOMYCIN HCL IN DEXTROSE 750-5 MG/150ML-% IV SOLN
750.0000 mg | Freq: Two times a day (BID) | INTRAVENOUS | Status: AC
  Administered 2014-09-17 – 2014-09-23 (×14): 750 mg via INTRAVENOUS
  Filled 2014-09-17 (×14): qty 150

## 2014-09-17 MED ORDER — FENTANYL CITRATE 0.05 MG/ML IJ SOLN
100.0000 ug | Freq: Once | INTRAMUSCULAR | Status: AC
  Administered 2014-09-17: 100 ug via INTRAVENOUS

## 2014-09-17 MED ORDER — VITAL HIGH PROTEIN PO LIQD: 1000.0000 mL | ORAL | Status: DC

## 2014-09-17 MED ORDER — PIPERACILLIN-TAZOBACTAM 3.375 G IVPB
3.3750 g | Freq: Three times a day (TID) | INTRAVENOUS | Status: AC
  Administered 2014-09-17 – 2014-09-23 (×20): 3.375 g via INTRAVENOUS
  Filled 2014-09-17 (×20): qty 50

## 2014-09-17 MED ORDER — FENTANYL CITRATE 0.05 MG/ML IJ SOLN
25.0000 ug/h | INTRAMUSCULAR | Status: DC
  Administered 2014-09-17: 25 ug/h via INTRAVENOUS
  Filled 2014-09-17: qty 50

## 2014-09-17 MED ORDER — SODIUM CHLORIDE 0.9 % IV BOLUS (SEPSIS)
500.0000 mL | INTRAVENOUS | Status: DC | PRN
  Administered 2014-09-17 (×4): 500 mL via INTRAVENOUS
  Administered 2014-09-18: via INTRAVENOUS

## 2014-09-17 MED ORDER — ROCURONIUM BROMIDE 50 MG/5ML IV SOLN
50.0000 mg | Freq: Once | INTRAVENOUS | Status: AC
  Administered 2014-09-17: 50 mg via INTRAVENOUS

## 2014-09-17 MED ORDER — FENTANYL CITRATE 0.05 MG/ML IJ SOLN
INTRAMUSCULAR | Status: AC
  Administered 2014-09-17: 100 ug via INTRAVENOUS
  Filled 2014-09-17: qty 4

## 2014-09-17 MED ORDER — MIDAZOLAM HCL 2 MG/2ML IJ SOLN
2.0000 mg | Freq: Once | INTRAMUSCULAR | Status: AC
  Administered 2014-09-17: 2 mg via INTRAVENOUS

## 2014-09-17 MED ORDER — INSULIN ASPART 100 UNIT/ML ~~LOC~~ SOLN
0.0000 [IU] | SUBCUTANEOUS | Status: DC
  Administered 2014-09-17: 3 [IU] via SUBCUTANEOUS
  Administered 2014-09-17: 2 [IU] via SUBCUTANEOUS
  Administered 2014-09-18: 3 [IU] via SUBCUTANEOUS
  Administered 2014-09-18: 2 [IU] via SUBCUTANEOUS
  Administered 2014-09-18: 3 [IU] via SUBCUTANEOUS
  Administered 2014-09-18 – 2014-09-23 (×11): 2 [IU] via SUBCUTANEOUS
  Administered 2014-09-23: 3 [IU] via SUBCUTANEOUS
  Administered 2014-09-23 – 2014-09-24 (×2): 2 [IU] via SUBCUTANEOUS
  Administered 2014-09-24: 3 [IU] via SUBCUTANEOUS
  Administered 2014-09-24 (×2): 2 [IU] via SUBCUTANEOUS

## 2014-09-17 MED ORDER — VITAL AF 1.2 CAL PO LIQD
1000.0000 mL | ORAL | Status: DC
  Administered 2014-09-17 – 2014-09-23 (×4): 1000 mL
  Filled 2014-09-17 (×10): qty 1000

## 2014-09-17 MED ORDER — SODIUM CHLORIDE 0.9 % IV BOLUS (SEPSIS)
1000.0000 mL | Freq: Once | INTRAVENOUS | Status: AC
  Administered 2014-09-17: 1000 mL via INTRAVENOUS

## 2014-09-17 NOTE — Progress Notes (Signed)
[facility] ADULT ICU REPLACEMENT PROTOCOL FOR AM LAB REPLACEMENT ONLY  The patient does apply for the [facility] Adult ICU Electrolyte Replacment Protocol based on the criteria listed below:   1. Is GFR >/= 40 ml/min? Yes.    Patient's GFR today is >90 2. Is urine output >/= 0.5 ml/kg/hr for the last 6 hours? Yes.   Patient's UOP is 3.2 ml/kg/hr 3. Is BUN < 60 mg/dL? Yes.    Patient's BUN today is 19 4. Abnormal electrolyte(s): K 3.1 5. Ordered repletion with: per protocol 6. If a panic level lab has been reported, has the CCM MD in charge been notified? No..   Physician:    Ronda Fairly A 09/17/2014 6:24 AM

## 2014-09-17 NOTE — Progress Notes (Signed)
Pt seen and examined. Pt was extubated yesterday, but has gotten progressively labored and generally weak.   EXAM: Temp:  [97.8 F (36.6 C)-102.2 F (39 C)] 102.2 F (39 C) (09/21 0800) Pulse Rate:  [73-105] 94 (09/21 0815) Resp:  [13-27] 22 (09/21 0815) BP: (112-155)/(55-111) 138/75 mmHg (09/21 0700) SpO2:  [86 %-100 %] 96 % (09/21 0700) FiO2 (%):  [50 %-60 %] 50 % (09/21 0815) Weight:  [56.1 kg (123 lb 10.9 oz)] 56.1 kg (123 lb 10.9 oz) (09/21 0400) Intake/Output     09/20 0701 - 09/21 0700 09/21 0701 - 09/22 0700   I.V. (mL/kg) 1055 (18.8)    Other     NG/GT     IV Piggyback 300    Total Intake(mL/kg) 1355 (24.2)    Urine (mL/kg/hr) 3975 (3)    Stool 350 (0.3)    Total Output 4325     Net -2970           Awake, alert Responds appropriately to questions Moves all extremities with good strength, symetric  LABS: Lab Results  Component Value Date   CREATININE 0.60 09/17/2014   BUN 19 09/17/2014   NA 152* 09/17/2014   K 3.1* 09/17/2014   CL 109 09/17/2014   CO2 29 09/17/2014   Lab Results  Component Value Date   WBC 19.1* 09/17/2014   HGB 12.8 09/17/2014   HCT 40.6 09/17/2014   MCV 95.8 09/17/2014   PLT 274 09/17/2014    IMPRESSION: - 63 y.o. female Nelson d# 7 s/p Acom coiling with likely stress-cardiomyopathy. Neurologically stable without clinical vasospasm - Progressive dyspnea requiring re-intubation poss sec to Aspiration Pna - Now likely hypovolemic  PLAN: - Cont close neurologic observation for spasm with MWF TCD. Nimotop/Zocor for prophylaxis - Plan on increasing IVF, monitor CVP - Broaden abx coverage - Gastric tube placement

## 2014-09-17 NOTE — Progress Notes (Addendum)
Patient Profile: Mindy Mills is a 63 y.o. F who was admitted on 09/11/14 after a witnessed syncopal episode followed by a grand mal seizure while in the car. Upon ED arrival, pt was unresponsive and not moving her right side. 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 She was intubated and underwent coil embolization of the AHA aneurysm by Dr. Leighton Ruff. She ruled in for a NSTEMI with peak troponin 5.4. ECG has shown poor anterior R wave progression and echo reveals EF 25- 30% with LAD infarct pattern and grade 2 diastolic dysfunction.  BNP was elevated suggesting acute systolic and diastolic dysfunction at 1517.    Subjective: Patient re-intubated this am.   Objective: Vital signs in last 24 hours: Temp:  [97.8 F (36.6 C)-102.2 F (39 C)] 101 F (38.3 C) (09/21 1141) Pulse Rate:  [86-121] 86 (09/21 1300) Resp:  [14-27] 21 (09/21 1300) BP: (105-155)/(53-95) 114/70 mmHg (09/21 1300) SpO2:  [86 %-100 %] 98 % (09/21 1300) FiO2 (%):  [50 %-100 %] 50 % (09/21 1300) Weight:  [123 lb 10.9 oz (56.1 kg)] 123 lb 10.9 oz (56.1 kg) (09/21 0400) Last BM Date: 09/17/14  Intake/Output from previous day: 09/20 0701 - 09/21 0700 In: 1355 [I.V.:1055; IV Piggyback:300] Out: 4325 [Urine:3975; Stool:350] Intake/Output this shift: Total I/O In: 1387.2 [I.V.:37.2; IV Piggyback:1350] Out: 975 [Urine:975]  Medications Current Facility-Administered Medications  Medication Dose Route Frequency Provider Last Rate Last Dose  . acetaminophen (TYLENOL) solution 650 mg  650 mg Per Tube Q4H PRN Elsie Stain, MD   650 mg at 09/17/14 1343  . acetaminophen (TYLENOL) suppository 650 mg  650 mg Rectal Q4H PRN Consuella Lose, MD   650 mg at 09/16/14 0419  . albuterol (PROVENTIL) (2.5 MG/3ML) 0.083% nebulizer solution 2.5 mg  2.5 mg Nebulization Q2H PRN Kristeen Miss, MD      . antiseptic oral rinse (CPC / CETYLPYRIDINIUM CHLORIDE 0.05%) solution 7 mL  7 mL Mouth  Rinse QID Consuella Lose, MD   7 mL at 09/17/14 1200  . bisacodyl (DULCOLAX) suppository 10 mg  10 mg Rectal Daily PRN Kristeen Miss, MD      . budesonide (PULMICORT) nebulizer solution 0.5 mg  0.5 mg Nebulization BID Consuella Lose, MD   0.5 mg at 09/17/14 0815  . chlorhexidine (PERIDEX) 0.12 % solution 15 mL  15 mL Mouth Rinse BID Consuella Lose, MD   15 mL at 09/17/14 0828  . dextrose 5 % solution   Intravenous Continuous Elsie Stain, MD      . feeding supplement (VITAL AF 1.2 CAL) liquid 1,000 mL  1,000 mL Per Tube Continuous Heather Cornelison Pitts, RD      . fentaNYL (SUBLIMAZE) 2,500 mcg in sodium chloride 0.9 % 250 mL (10 mcg/mL) infusion  25-400 mcg/hr Intravenous Continuous Rush Farmer, MD 2.5 mL/hr at 09/17/14 1032 25 mcg/hr at 09/17/14 1032  . fentaNYL (SUBLIMAZE) injection 25-50 mcg  25-50 mcg Intravenous Q2H PRN Kara Mead V, MD   50 mcg at 09/16/14 1641  . haloperidol lactate (HALDOL) injection 5 mg  5 mg Intravenous Once Kara Mead V, MD      . ipratropium-albuterol (DUONEB) 0.5-2.5 (3) MG/3ML nebulizer solution 3 mL  3 mL Nebulization Q6H Consuella Lose, MD   3 mL at 09/17/14 0813  . labetalol (NORMODYNE,TRANDATE) injection 10-40 mg  10-40 mg Intravenous Q10 min PRN Consuella Lose, MD      . levETIRAcetam (  KEPPRA) IVPB 500 mg/100 mL premix  500 mg Intravenous Q12H Nathan R. Pickering, MD   500 mg at 09/17/14 1356  . methylPREDNISolone sodium succinate (SOLU-MEDROL) 40 mg/mL injection 40 mg  40 mg Intravenous Q12H Kara Mead V, MD   40 mg at 09/17/14 0557  . morphine 2 MG/ML injection 1-2 mg  1-2 mg Intravenous Q2H PRN Rigoberto Noel, MD   2 mg at 09/13/14 0858  . niMODipine (NIMOTOP) capsule 60 mg  60 mg Oral Q4H Consuella Lose, MD   60 mg at 09/17/14 0442   Or  . NiMODipine (NYMALIZE) 60 MG/20ML oral solution 60 mg  60 mg Per Tube Q4H Consuella Lose, MD   60 mg at 09/17/14 1343  . pantoprazole (PROTONIX) injection 40 mg  40 mg Intravenous QHS Consuella Lose, MD   40 mg at 09/16/14 2155  . piperacillin-tazobactam (ZOSYN) IVPB 3.375 g  3.375 g Intravenous Q8H Consuella Lose, MD   3.375 g at 09/17/14 1143  . propofol (DIPRIVAN) 10 mg/ml infusion  5-70 mcg/kg/min Intravenous Continuous Rush Farmer, MD 3.4 mL/hr at 09/17/14 0934 10 mcg/kg/min at 09/17/14 0934  . vancomycin (VANCOCIN) IVPB 750 mg/150 ml premix  750 mg Intravenous Q12H Consuella Lose, MD   750 mg at 09/17/14 1037    PE: General appearance: intubated Lungs: clear to auscultation bilaterally Heart: regular rate and rhythm Extremities: no LEE Pulses: 2+ and symmetric Skin: warm and dry Neurologic: intubated  Lab Results:   Recent Labs  09/15/14 0520 09/16/14 0415 09/17/14 0459  WBC 15.6* 12.1* 19.1*  HGB 11.2* 11.0* 12.8  HCT 35.0* 34.8* 40.6  PLT 203 198 274   BMET  Recent Labs  09/16/14 0415 09/16/14 1830 09/17/14 0459  NA 154* 154* 152*  K 3.9 3.3* 3.1*  CL 117* 118* 109  CO2 26 24 29   GLUCOSE 165* 157* 148*  BUN 23 20 19   CREATININE 0.58 0.50 0.60  CALCIUM 8.5 8.3* 8.8   Assessment/Plan  Active Problems:   Subarachnoid hemorrhage due to ruptured aneurysm   Cardiomyopathy, 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 Cardiomyopathy: EF 25-30% on admit. Initial 2D echo 9/16 demonstrated a pattern very suggestive of stress cardiomyopathy with wall motion abnormalities way out of proportion to the relatively small enzyme leak and they do not respect typical coronary anatomy.  Plan is for repeat 2D echo today to reassess LVF. CHF meds were placed on hold yesterday due to low BP. Will re-evaluate need for continuation of meds based on echo results.   2. Elevated Cardiac Enzymes: Further eval will depend on repeat 2D echo results. If EF not significantly improved  then will plan for LHC. If significantly improved, can opt for a noninvasive NST once acute illness resolves.    3. Progressive Dyspnea: Pt required re-intubation earlier today. Suspected aspiration PNA. On antibiotics per critical care.      LOS: 6 days    Mindy Mills 09/17/2014 2:30 PM  I have seen and evaluated the patient this PM along with Mindy Henri, PA. I agree with her findings, examination as well as impression recommendations.  Admitted with SAH - troponin + with low EF --> plan will be to recheck Echo - ? If LAD WMA & + Trop is related to CAD vs.  [place] CM.  Would very much like to delay cardiac cath until [facility] stable as PCI would only be possible if no active bleeding.  With low EF - pulmonary edema must be considered, low threshold for trial of diuretic.  BP & HR are stable - avoid hypotension. - pending results of relook Echo - will need to consider +/- addition of CHF meds.   Will follow.  Mindy Mills, M.D., M.S. Interventional Cardiologist   Pager # [phone]

## 2014-09-17 NOTE — Progress Notes (Signed)
NUTRITION FOLLOW-UP  INTERVENTION: Vital AF 1.2 @ 50 ml/hr via OG tube   Tube feeding regimen provides 1440 kcal (100% of needs), 90 grams of protein, and 973 ml of H2O.   NUTRITION DIAGNOSIS: Inadequate oral intake related to inability to eat as evidenced by NPO status; ongoing.   Goal: Pt to meet >/= 90% of their estimated nutrition needs; not met.    Monitor:  Respiratory status, TF tolerance, labs  ASSESSMENT: Pt admitted after witnessed grand mal seizure. 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 Pt self extubated 9/16 but re-intubated 9/17. Pt extubated 9/20 but re-intubated 9/21.   Patient is currently intubated on ventilator support MV: 7.5 L/min Temp (24hrs), Avg:100.3 F (37.9 C), Min:99 F (37.2 C), Max:102.2 F (39 C)  OG tube in place.  No fat or muscle depletion noted.  Potassium low on replacement.   Height: Ht Readings from Last 1 Encounters:  09/13/14 $RemoveB'5\' 3"'[id number]$  (1.6 m)    Weight: Wt Readings from Last 1 Encounters:  09/17/14 123 lb 10.9 oz (56.1 kg)    BMI:  Body mass index is 21.91 kg/(m^2).  Estimated Nutritional Needs: Kcal: 1400 Protein: 85-100 grams Fluid: > 1.5 L/day  Skin: ecchymosis  Diet Order: NPO   Intake/Output Summary (Last 24 hours) at 09/17/14 1639 Last data filed at 09/17/14 1600  Gross per 24 hour  Intake 2259.87 ml  Output   5050 ml  Net -2790.13 ml    Last BM: 9/20  Labs:   Recent Labs Lab 09/11/14 1729 09/12/14 0450  09/16/14 0415 09/16/14 1830 09/17/14 0459 09/17/14 1050  NA 141 144  < > 154* 154* 152*  --   K 2.9* 4.5  < > 3.9 3.3* 3.1*  --   CL  --  110  < > 117* 118* 109  --   CO2  --  19  < > $R'26 24 29  'aB$ --   BUN  --  10  < > $R'23 20 19  'wU$ --   CREATININE  --  0.56  < > 0.58 0.50 0.60  --   CALCIUM  --  7.5*  < > 8.5 8.3* 8.8  --   MG  --  1.6  --   --   --   --  1.8  PHOS  --  2.2*  --   --   --   --  4.1  GLUCOSE 197* 162*  < > 165* 157* 148*  --   < > = values in  this interval not displayed.  CBG (last 3)   Recent Labs  09/17/14 0434 09/17/14 1153 09/17/14 1558  GLUCAP 145* 170* 152*    Scheduled Meds: . antiseptic oral rinse  7 mL Mouth Rinse QID  . budesonide  0.5 mg Nebulization BID  . chlorhexidine  15 mL Mouth Rinse BID  . haloperidol lactate  5 mg Intravenous Once  . insulin aspart  0-15 Units Subcutaneous 6 times per day  . ipratropium-albuterol  3 mL Nebulization Q6H  . levETIRAcetam  500 mg Intravenous Q12H  . methylPREDNISolone (SOLU-MEDROL) injection  40 mg Intravenous Q12H  . niMODipine  60 mg Oral Q4H   Or  . NiMODipine  60 mg Per Tube Q4H  . pantoprazole (PROTONIX) IV  40 mg Intravenous QHS  . piperacillin-tazobactam (ZOSYN)  IV  3.375 g Intravenous Q8H  . vancomycin  750 mg Intravenous Q12H    Continuous Infusions: . dextrose Stopped (  09/17/14 0700)  . feeding supplement (VITAL AF 1.2 CAL) 1,000 mL (09/17/14 1552)  . fentaNYL infusion INTRAVENOUS 25 mcg/hr (09/17/14 1032)  . propofol 10 mcg/kg/min (09/17/14 0934)    [place], [place], [place] Pager [phone] After Hours Pager

## 2014-09-17 NOTE — Progress Notes (Signed)
SLP Cancellation Note  Patient Details Name: Mindy Mills MRN: [id number] DOB: [birth year]   Cancelled treatment:       Reason Eval/Treat Not Completed: Medical issues which prohibited therapy. Patient on Bipap. Requiring re-intubation per RN. Signing off. Please re-consult when appropriate.  [place], CCC-SLP [phone]   Kadance Mccuistion Meryl 09/17/2014, 9:01 AM

## 2014-09-17 NOTE — Progress Notes (Signed)
PT Cancellation Note  Patient Details Name: Tyger Oka MRN: [id number] DOB: [birth year]   Cancelled Treatment:    Reason Eval/Treat Not Completed: Medical issues which prohibited therapy (pt to be reintubated, will sign off and request new orders). Please re-consult when medically appropriate.   Duncan Dull 09/17/2014, 9:01 AM Alben Deeds, PT DPT  [phone]

## 2014-09-17 NOTE — Progress Notes (Signed)
Sputum sample obtained (small, tan, thick) and sent down to lab by RT.

## 2014-09-17 NOTE — Procedures (Signed)
Intubation Procedure Note Mindy Mills [id number] 22-Oct-1951  Procedure: Intubation Indications: Airway protection and maintenance  Procedure Details Consent: Risks of procedure as well as the alternatives and risks of each were explained to the (patient/caregiver).  Consent for procedure obtained. Time Out: Verified patient identification, verified procedure, site/side was marked, verified correct patient position, special equipment/implants available, medications/allergies/relevent history reviewed, required imaging and test results available.  Performed  Maximum sterile technique was used including antiseptics, gloves, hand hygiene and mask.  MAC    Evaluation Hemodynamic Status: BP stable throughout; O2 sats: stable throughout Patient's Current Condition: stable Complications: No apparent complications Patient did tolerate procedure well. Chest X-ray ordered to verify placement.  CXR: pending.   Mindy Mills 09/17/2014

## 2014-09-17 NOTE — Progress Notes (Signed)
ANTIBIOTIC CONSULT NOTE - INITIAL  Pharmacy Consult for Vancomycin, Zosyn Indication: pneumonia  Allergies  Allergen Reactions  . Bee Venom Anaphylaxis and Hives    Patient Measurements: Height: 5\' 3"  (160 cm) Weight: 123 lb 10.9 oz (56.1 kg) IBW/kg (Calculated) : 52.4  Labs:  Recent Labs  09/15/14 0520 09/16/14 0415 09/16/14 1830 09/17/14 0459  WBC 15.6* 12.1*  --  19.1*  HGB 11.2* 11.0*  --  12.8  PLT 203 198  --  274  CREATININE 0.59 0.58 0.50 0.60   Microbiology: Recent Results (from the past 720 hour(s))  MRSA PCR SCREENING     Status: None   Collection Time    09/11/14  7:44 PM      Result Value Ref Range Status   MRSA by PCR NEGATIVE  NEGATIVE Final   Comment:            The GeneXpert MRSA Assay (FDA     approved for NASAL specimens     only), is one component of a     comprehensive MRSA colonization     surveillance program. It is not     intended to diagnose MRSA     infection nor to guide or     monitor treatment for     MRSA infections.  CULTURE, RESPIRATORY (NON-EXPECTORATED)     Status: None   Collection Time    09/15/14  5:24 PM      Result Value Ref Range Status   Specimen Description ENDOTRACHEAL   Final   Special Requests Normal   Final   Gram Stain     Final   Value: RARE WBC PRESENT, PREDOMINANTLY PMN     MODERATE SQUAMOUS EPITHELIAL CELLS PRESENT     FEW YEAST     ABUNDANT GRAM NEGATIVE RODS     MODERATE GRAM POSITIVE RODS   Culture     Final   Value: Culture reincubated for better growth     Performed at [facility]   Report Status PENDING   Incomplete  CLOSTRIDIUM DIFFICILE BY PCR     Status: None   Collection Time    09/15/14 10:09 PM      Result Value Ref Range Status   C difficile by pcr NEGATIVE  NEGATIVE Final    Medical History: Past Medical History  Diagnosis Date  . Hypertension   . Migraine   . Cancer     Cervicle  . Hepatitis C     Assessment: 63 year old female [place] now pod #7 Acom  coiling Extubated 9/20 Now with progressive dyspnea requiring re-intubation today 9/21 possibly secondary to aspiration pneumonia  Pharmacy asked to begin Vancomycin and Zosyn  Goal of Therapy:  Vancomycin trough level 15-20 mcg/ml Appropriate Zosyn dosing  Plan:  1) Zosyn 3.375 grams iv Q 8 hours 2) Vancomycin 750 mg iv Q 12 hours 3) Continue to follow progress, fever trend, cultures  Thank you. Anette Guarneri, PharmD [phone]  09/17/2014,9:49 AM

## 2014-09-17 NOTE — Progress Notes (Signed)
Patient intubated by Dr. Nelda Marseille.  Intubated with 7.5 ETT secured at 24 at the lip.  Patient tolerated well.  No complications noted.  Will continue to monitor.

## 2014-09-17 NOTE — Progress Notes (Signed)
eLink Physician-Brief Progress Note Patient Name: Mindy Mills DOB: 15-Sep-1951 MRN: [id number]   Date of Service  09/17/2014  HPI/Events of Note  Mild hyperglycemia Relative hypotension in setting of abn CTDs Low CVP   eICU Interventions  SSI ordered NS boluses PRN to maintain CVP > 8     Intervention Category Intermediate Interventions: Hyperglycemia - evaluation and treatment;Hypotension - evaluation and management  Merton Border 09/17/2014, 4:08 PM

## 2014-09-17 NOTE — Progress Notes (Signed)
PULMONARY / CRITICAL CARE MEDICINE   Name: Mindy Mills MRN: [id number] DOB: September 22, 1951    ADMISSION DATE:  09/11/2014 CONSULTATION DATE:  09/17/2014  REFERRING MD :  Kathyrn Sheriff  CHIEF COMPLAINT:  Seizure  INITIAL PRESENTATION: 63 y.o. F brought to [facility] ED on 9/15 after a witnessed syncopal episode followed by a grand mal seizure while in the car.  Upon ED arrival, pt was unresponsive and not moving her right side.  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  She was intubated and underwent A com aneurysm coiling by  Dr. Leighton Ruff.  PCCM was consulted.  She was reintubated 9/17 for pulmonary edema & hypoxia induced delerium PMH - hepc, smoker, chronic headaches on narcotics  STUDIES:  CTA Head 9/15 >>> large SAH, 2.5 x 59mm right ACA aneurysm as source of SAH. Echo 9/16 >>EF 25-30%, apical akinesis- LAD infarction pattern.Moderate diastolic dysfunction .  RVSP 50  SIGNIFICANT EVENTS: 9/15 - admitted with [facility] and right ACA aneurysm, underwent endovascular repair, returned to ICU on vent. 9/15 - A com aneurysm coiled 9/16 difficulty with panda 9/18 OGT placed by GI 9/21 VDRF, BiPAP overnight, required   SUBJECTIVE: Awake off drips this am Febrile Good UO off lasix CVP 1-12 range  VITAL SIGNS: Temp:  [97.8 F (36.6 C)-102.2 F (39 C)] 102.2 F (39 C) (09/21 0800) Pulse Rate:  [69-105] 94 (09/21 0815) Resp:  [13-27] 22 (09/21 0815) BP: (112-155)/(55-111) 138/75 mmHg (09/21 0700) SpO2:  [86 %-100 %] 96 % (09/21 0700) FiO2 (%):  [40 %-60 %] 50 % (09/21 0815) Weight:  [56.1 kg (123 lb 10.9 oz)] 56.1 kg (123 lb 10.9 oz) (09/21 0400)  HEMODYNAMICS: CVP:  [0 mmHg-13 mmHg] 8 mmHg  VENTILATOR SETTINGS: Vent Mode:  [-] BIPAP FiO2 (%):  [40 %-60 %] 50 % Set Rate:  [8 bmp] 8 bmp PEEP:  [5 cmH20] 5 cmH20  INTAKE / OUTPUT: Intake/Output     09/20 0701 - 09/21 0700 09/21 0701 - 09/22 0700   I.V. (mL/kg) 1055 (18.8)    Other     NG/GT     IV Piggyback 300    Total  Intake(mL/kg) 1355 (24.2)    Urine (mL/kg/hr) 3975 (3)    Stool 350 (0.3)    Total Output 4325     Net -2970           PHYSICAL EXAMINATION: General: Chronically ill appearing elderly female,  Extubated but lethargic, on BiPAP. Neuro: lethargic off drips, non-focal, RASS +1 HEENT: [place]/AT. PERRL, sclerae anicteric. Cardiovascular: RRR, no M/R/G.  Lungs: Respirations even and unlabored. BL decreased, no rhonchi, on BiPAP Abdomen: BS x 4, soft, NT/ND.  Musculoskeletal: No gross deformities, no edema.  Skin: Intact, warm, no rashes.  LABS:  CBC  Recent Labs Lab 09/15/14 0520 09/16/14 0415 09/17/14 0459  WBC 15.6* 12.1* 19.1*  HGB 11.2* 11.0* 12.8  HCT 35.0* 34.8* 40.6  PLT 203 198 274   Coag's  Recent Labs Lab 09/11/14 2030  APTT 26  INR 1.04   BMET  Recent Labs Lab 09/16/14 0415 09/16/14 1830 09/17/14 0459  NA 154* 154* 152*  K 3.9 3.3* 3.1*  CL 117* 118* 109  CO2 26 24 29   BUN 23 20 19   CREATININE 0.58 0.50 0.60  GLUCOSE 165* 157* 148*   Electrolytes  Recent Labs Lab 09/12/14 0450  09/16/14 0415 09/16/14 1830 09/17/14 0459  CALCIUM 7.5*  < > 8.5 8.3* 8.8  MG 1.6  --   --   --   --  PHOS 2.2*  --   --   --   --   < > = values in this interval not displayed. Sepsis Markers No results found for this basename: LATICACIDVEN, PROCALCITON, O2SATVEN,  in the last 168 hours  ABG  Recent Labs Lab 09/12/14 1306 09/13/14 1313 09/16/14 0840  PHART 7.415 7.467* 7.393  PCO2ART 32.6* 37.2 39.9  PO2ART 49.0* 64.0* 80.0    Liver Enzymes  Recent Labs Lab 09/11/14 1304  AST 16  ALT 10  ALKPHOS 77  BILITOT 0.3  ALBUMIN 3.5   Cardiac Enzymes  Recent Labs Lab 09/11/14 2030 09/12/14 0215 09/12/14 0455 09/13/14 0524  TROPONINI 5.10* 5.40* 4.43* 1.62*  PROBNP 3137.0*  --   --   --     Glucose  Recent Labs Lab 09/16/14 0813 09/16/14 1150 09/16/14 1541 09/16/14 1916 09/17/14 0032 09/17/14 0434  GLUCAP 135* 135* 143* 143* 149* 145*     Imaging CXR - BL ASD -decreased edema pattern  ASSESSMENT / PLAN:  PULMONARY OETT 9/15 >>>9/16, 9/17 >> A: VDRF - in setting of 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 Aspiration pneumonia P:   Reintubate today. DuoNebs. Ct solumedrol 40 q 24 for now.  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 Propofol drip. Keppra per neuro recs. Ct nimodipine  CARDIOVASCULAR A:  Hx HTN Incomplete LBBB on EKG, nstemi Acute Pulmonary edema  NSTEMI - LAD pattern WMA on echo vs Takatsubo's P:  Cards consult appreciated Hold Lasix , CVP goal 10-12 now that in vasospasm range 1L NS fluid bolus now  RENAL A:   Hypokalemia Mild metabolic acidosis- resolved Hypernatremia   P:  Replace electrolytes as indicated. KVO IVF BMET in AM.  GASTROINTESTINAL A:  Hx HCV Difficult panda placement P:   SUP: Pantoprazole. Consult nutrition for TF  HEMATOLOGIC A:   VTE Prophylaxis P:  SCD's only.  INFECTIOUS A:  Aspiration pneumonia resp cult 9/17 >> P:   D/C ceftx/ clinda Vanc 9/21>>> Zosyn 9/21>>> Pan culture 9/21>>>  ENDOCRINE A:   Hyperglycemia  P:   CBG's q4hr. SSI.  Summary - Suspect aspiration event, will reintubate, mechanically ventilate, broaden abx spectrum, hold further diuresis, give some fluid back and follow CVP.  Son updated bedside.  I have personally obtained a history, examined the patient, evaluated laboratory and imaging results, formulated the assessment and plan and placed orders.  CRITICAL CARE: The patient is critically ill with multiple organ systems failure and requires high complexity decision making for assessment and support, frequent evaluation and titration of therapies, application of advanced monitoring technologies and extensive interpretation of multiple  databases. Critical Care Time devoted to patient care services described in this note is 35 minutes.   Rush Farmer, M.D. [facility] Pulmonary/Critical Care Medicine. Pager: [phone]. After hours pager: [phone].  09/17/2014  8:44 AM

## 2014-09-17 NOTE — Progress Notes (Signed)
PT Cancellation Note  Patient Details Name: Mindy Mills MRN: [id number] DOB: February 12, 1951   Cancelled Treatment:    Reason Eval/Treat Not Completed: Patient not medically ready (currently on BiPap, spoke with nsg, will hold for now) Will follow up when appropriate for therapies.   Duncan Dull 09/17/2014, 7:54 AM Alben Deeds, PT DPT  [phone]

## 2014-09-17 NOTE — Progress Notes (Signed)
VASCULAR LAB PRELIMINARY  PRELIMINARY  PRELIMINARY  PRELIMINARY  Transcranial Doppler  Date POD PCO2 HCT BP  MCA ACA PCA OPHT SIPH VERT Basilar  9-16- 15 SB     Right  Left   42  27   -35  -31   28  24   15  13    36  45   -18  --     -31    9-18- 15 SB     Right  Left   62  39   --  -41   --  --   12  12   38  39   --  -29     --    9/21/ 15 MS  42.2 40.6 113/62 Right  Left   54  66   -100  -25   42  44   19  28   47  36   -22  -28     -28          Right  Left                                             Right  Left                                            Right  Left                                            Right  Left                                        MCA = Middle Cerebral Artery      OPHT = Opthalmic Artery     BASILAR = Basilar Artery   ACA = Anterior Cerebral Artery     SIPH = Carotid Siphon PCA = Posterior Cerebral Artery   VERT = Verterbral Artery                   Normal MCA = 62+\-12 ACA = 50+\-12 PCA = 42+\-23       09/17/2014 12:34 PM Maudry Mayhew, RVT, RDCS, RDMS

## 2014-09-17 NOTE — Progress Notes (Signed)
  Echocardiogram 2D Echocardiogram has been performed.  Mindy Mills M 09/17/2014, 3:09 PM

## 2014-09-17 NOTE — Progress Notes (Signed)
Dr. Kathyrn Sheriff notified of [place] . Pt awake, MAE against gravity.

## 2014-09-17 NOTE — Progress Notes (Deleted)
Echocardiogram 2D Echocardiogram has been performed.  Mindy Mills M 09/17/2014, 2:42 PM

## 2014-09-18 ENCOUNTER — Inpatient Hospital Stay (HOSPITAL_COMMUNITY): Payer: Medicare Other

## 2014-09-18 LAB — GLUCOSE, CAPILLARY
GLUCOSE-CAPILLARY: 141 mg/dL — AB (ref 70–99)
GLUCOSE-CAPILLARY: 120 mg/dL — AB (ref 70–99)
Glucose-Capillary: 115 mg/dL — ABNORMAL HIGH (ref 70–99)
Glucose-Capillary: 186 mg/dL — ABNORMAL HIGH (ref 70–99)
Glucose-Capillary: 122 mg/dL — ABNORMAL HIGH (ref 70–99)

## 2014-09-18 LAB — BASIC METABOLIC PANEL
Anion gap: 10 (ref 5–15)
BUN: 18 mg/dL (ref 6–23)
CHLORIDE: 118 meq/L — AB (ref 96–112)
CO2: 25 meq/L (ref 19–32)
CREATININE: 0.56 mg/dL (ref 0.50–1.10)
Calcium: 7.6 mg/dL — ABNORMAL LOW (ref 8.4–10.5)
GFR calc non Af Amer: >90 mL/min (ref >90)
Glucose, Bld: 125 mg/dL — ABNORMAL HIGH (ref 70–99)
POTASSIUM: 3.3 meq/L — AB (ref 3.7–5.3)
SODIUM: 153 meq/L — AB (ref 137–147)

## 2014-09-18 LAB — CULTURE, RESPIRATORY W GRAM STAIN: Special Requests: NORMAL

## 2014-09-18 LAB — BLOOD GAS, ARTERIAL
ACID-BASE DEFICIT: 0.2 mmol/L (ref 0.0–2.0)
Bicarbonate: 23.6 mEq/L (ref 20.0–24.0)
Drawn by: 40415
FIO2: 0.4 %
O2 Saturation: 92.4 %
PATIENT TEMPERATURE: 98.6
PCO2 ART: 35.7 mmHg (ref 35.0–45.0)
PEEP: 5 cmH2O
RATE: 14 resp/min
TCO2: 24.6 mmol/L (ref 0–100)
VT: 500 mL
pH, Arterial: 7.435 (ref 7.350–7.450)
pO2, Arterial: 65.4 mmHg — ABNORMAL LOW (ref 80.0–100.0)

## 2014-09-18 LAB — CULTURE, RESPIRATORY

## 2014-09-18 LAB — CBC
HEMATOCRIT: 36.5 % (ref 36.0–46.0)
HEMOGLOBIN: 11.4 g/dL — AB (ref 12.0–15.0)
MCH: 30.1 pg (ref 26.0–34.0)
MCHC: 31.2 g/dL (ref 30.0–36.0)
MCV: 96.3 fL (ref 78.0–100.0)
Platelets: 228 10*3/uL (ref 150–400)
RBC: 3.79 MIL/uL — ABNORMAL LOW (ref 3.87–5.11)
RDW: 15.2 % (ref 11.5–15.5)
WBC: 17.3 10*3/uL — AB (ref 4.0–10.5)

## 2014-09-18 LAB — URINE CULTURE
CULTURE: NO GROWTH
Colony Count: NO GROWTH

## 2014-09-18 LAB — PHOSPHORUS: PHOSPHORUS: 2.6 mg/dL (ref 2.3–4.6)

## 2014-09-18 LAB — MAGNESIUM: MAGNESIUM: 1.9 mg/dL (ref 1.5–2.5)

## 2014-09-18 MED ORDER — FREE WATER
250.0000 mL | Freq: Four times a day (QID) | Status: DC
  Administered 2014-09-18 – 2014-09-24 (×14): 250 mL

## 2014-09-18 MED ORDER — POTASSIUM CHLORIDE 20 MEQ/15ML (10%) PO LIQD
40.0000 meq | Freq: Three times a day (TID) | ORAL | Status: AC
  Administered 2014-09-18 (×2): 40 meq
  Filled 2014-09-18 (×2): qty 30

## 2014-09-18 NOTE — Progress Notes (Signed)
Pt seen and examined. No issues overnight, was reintubated yesterday am.  EXAM: Temp:  [99.1 F (37.3 C)-101 F (38.3 C)] 99.5 F (37.5 C) (09/22 0340) Pulse Rate:  [69-121] 74 (09/22 0730) Resp:  [12-24] 17 (09/22 0730) BP: (87-131)/(48-79) 115/59 mmHg (09/22 0700) SpO2:  [94 %-100 %] 96 % (09/22 0803) FiO2 (%):  [40 %-100 %] 40 % (09/22 0803) Weight:  [60.4 kg (133 lb 2.5 oz)] 60.4 kg (133 lb 2.5 oz) (09/22 0356) Intake/Output     09/21 0701 - 09/22 0700 09/22 0701 - 09/23 0700   I.V. (mL/kg) 93.3 (1.5)    NG/GT 540    IV Piggyback 4800    Total Intake(mL/kg) 5433.3 (90)    Urine (mL/kg/hr) 2125 (1.5)    Stool 100 (0.1)    Total Output 2225     Net +3208.3           Awake, alert, nods to questions Tracks on both sides Intubated, breathes over vent Moving all extremities symetrically to command  LABS: Lab Results  Component Value Date   CREATININE 0.56 09/18/2014   BUN 18 09/18/2014   NA 153* 09/18/2014   K 3.3* 09/18/2014   CL 118* 09/18/2014   CO2 25 09/18/2014   Lab Results  Component Value Date   WBC 17.3* 09/18/2014   HGB 11.4* 09/18/2014   HCT 36.5 09/18/2014   MCV 96.3 09/18/2014   PLT 228 09/18/2014    IMAGING: TCD - increased LACA mean velocity to 100cm/s 2D ECHO - improvement in LV hypokinesis, EF~35%  IMPRESSION: - 63 y.o. female SAHd# 8 s/p Acom coiling, neurologically stable and intact. - Increased ACA velocity on TCD but exam remains stable - ?Aspiration Pna with VDRF - Slightly improved cardiomyopathy c/w Takotsubo  PLAN: - Cont close neurologic observation. Would attempt to d/c all sedatives if she can tolerate it for vent synchrony and agitation to better assess changes in neurologic exam given increase in TCD velocity yesterday. - Fluid mgmt for euvolemia, CVP goal > 8 - On broad spectrum Abx for pna

## 2014-09-18 NOTE — Progress Notes (Signed)
Patient Profile:  Mindy Mills is a 63 y.o. F who was admitted on 09/11/14 after a witnessed syncopal episode followed by a grand mal seizure while in the car. Upon ED arrival, pt was unresponsive and not moving her right side. 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 She was intubated and underwent coil embolization of the AHA aneurysm by Dr. Leighton Ruff. She ruled in for a NSTEMI with peak troponin 5.4. ECG has shown poor anterior R wave progression and echo reveals EF 25- 30% with LAD infarct pattern and grade 2 diastolic dysfunction. BNP was elevated suggesting acute systolic and diastolic dysfunction at 1594   Subjective: Intubated, full vent support- adjusting to bipap, on solumedrol on sedation   Objective: Vital signs in last 24 hours: Temp:  [99.1 F (37.3 C)-101.9 F (38.8 C)] 100 F (37.8 C) (09/22 1200) Pulse Rate:  [66-97] 66 (09/22 1159) Resp:  [12-25] 20 (09/22 1159) BP: (87-126)/(46-70) 105/55 mmHg (09/22 1159) SpO2:  [94 %-100 %] 98 % (09/22 1159) FiO2 (%):  [40 %-50 %] 40 % (09/22 1159) Weight:  [133 lb 2.5 oz (60.4 kg)] 133 lb 2.5 oz (60.4 kg) (09/22 0356) Weight change: 9 lb 7.7 oz (4.3 kg) Last BM Date: 09/17/14 Intake/Output from previous day: +3218 wt up 10 pounds since yesterday.    09/21 0701 - 09/22 0700 In: 5433.3 [I.V.:93.3; NG/GT:540; IV Piggyback:4800] Out: 2225 [Urine:2125; Stool:100] Intake/Output this shift: Total I/O In: 750 [Other:500; NG/GT:50; IV Piggyback:200] Out: 500 [Urine:500]  PE: General: NAD, sedated but opens eyes when name called Skin:Warm and dry, brisk capillary refill HEENT:normocephalic, mucus membranes moist Heart:S1S2 RRR without murmur, gallup, rub or click Lungs:clear without rales, rhonchi, or wheezes, diminished in bases VOP:FYTW, non tender, + BS, do not palpate liver spleen or masses Ext:no lower ext edema, 2+ pedal pulses, 2+ radial pulses Neuro:lethargic on sedation,     Lab Results:  Recent Labs  09/17/14 0459 09/18/14 0520  WBC 19.1* 17.3*  HGB 12.8 11.4*  HCT 40.6 36.5  PLT 274 228   BMET  Recent Labs  09/17/14 0459 09/18/14 0520  NA 152* 153*  K 3.1* 3.3*  CL 109 118*  CO2 29 25  GLUCOSE 148* 125*  BUN 19 18  CREATININE 0.60 0.56  CALCIUM 8.8 7.6*      Studies/Results: Dg Chest Port 1 View  09/18/2014   CLINICAL DATA:  Acute respiratory failure.  EXAM: PORTABLE CHEST - 1 VIEW  COMPARISON:  09/17/2014  FINDINGS: Endotracheal tube, central catheter, and NG tube appear in good position. Heart size and vascularity are normal. Interstitial accentuation has resolved. Small bilateral pleural effusions, essentially unchanged.  IMPRESSION: Interstitial accentuation seen on the prior study which may have represented slight pulmonary edema has resolved. Small bilateral pleural effusions.   Electronically Signed   By: Rozetta Nunnery M.D.   On: 09/18/2014 07:50   Dg Chest Port 1 View  09/17/2014   CLINICAL DATA:  Intubation.  EXAM: PORTABLE CHEST - 1 VIEW  COMPARISON:  09/17/2014.  FINDINGS: Endotracheal tube noted with its tip 4.6 cm above the carina. Right subclavian central line in stable position. NG tube noted with tip below the hemidiaphragm. Although the NG tube tip is noted projecting to the lateral upper abdominal wall, it is most likely in the fundus of the stomach. Progressive bilateral pulmonary interstitial edema and pleural effusions noted consistent with congestive heart failure. No pneumothorax.  No acute bony abnormality. Old left clavicular fracture is present.  IMPRESSION: 1. Progressive congestive heart failure and interstitial edema. 2. Endotracheal tube tip in good anatomic position. NG tube as described above. Right subclavian central line in stable position.   Electronically Signed   By: Marcello Moores  Register   On: 09/17/2014 10:42   Dg Chest Port 1 View  09/17/2014   CLINICAL DATA:  Pneumonitis  EXAM: PORTABLE CHEST - 1 VIEW   COMPARISON:  09/16/2014  FINDINGS: A right central venous line is again identified and stable appearance. The cardiac shadow is stable. The degree of vascular congestion is improve slightly in the interval. Persistent bibasilar densities are seen. Portion of this is related to pleural effusion. No pneumothorax is noted.  IMPRESSION: Slight decrease in the degree of vascular congestion. Persistent pleural effusions are seen.   Electronically Signed   By: Inez Catalina M.D.   On: 09/17/2014 07:41   Dg Chest Port 1 View  09/16/2014   CLINICAL DATA:  Oxygen desaturation.  EXAM: PORTABLE CHEST - 1 VIEW  COMPARISON:  Chest x-ray from the same day at 519 am  FINDINGS: Interval tracheal and esophageal extubation. Right subclavian central line remains in good position.  Diffuse worsening in lung aeration with interstitial and airspace opacities increase in the lower lungs. Small bilateral pleural effusions which are increasing. No pneumothorax.  IMPRESSION: Worsening pulmonary edema and increasing pleural effusions.   Electronically Signed   By: Jorje Guild M.D.   On: 09/16/2014 22:26   Dg Abd Portable 1v  09/17/2014   CLINICAL DATA:  OG tube placement  EXAM: PORTABLE ABDOMEN - 1 VIEW  COMPARISON:  09/15/2014 451 hr  FINDINGS: NG tube extends into the gastric fundus. No evidence of bowel obstruction.  IMPRESSION: NG tube in gastric fundus in good position.   Electronically Signed   By: Suzy Bouchard M.D.   On: 09/17/2014 11:06   2 D Echo: 09/12/14 Left ventricle: The cavity size was normal. Wall thickness was increased in a pattern of mild LVH. Systolic function was severely reduced. The estimated ejection fraction was in the range of 25% to 30%. Mid to apical anterior/anteroseptal/inferoseptal/inferior akinesis. Apical lateral akinesis. Akinesis of the apex. Features are consistent with a pseudonormal left ventricular filling pattern, with concomitant abnormal relaxation and increased filling  pressure (grade 2 diastolic dysfunction). - Aortic valve: There was no stenosis. - Mitral valve: Moderately calcified annulus. Mildly calcified leaflets . There was trivial regurgitation. - Right ventricle: The cavity size was normal. Systolic function was normal. - Tricuspid valve: Peak RV-RA gradient (S): 47 mm Hg. - Pulmonary arteries: PA peak pressure: 50 mm Hg (S). - Inferior vena cava: The vessel was normal in size. The respirophasic diameter changes were in the normal range (= 50%), consistent with normal central venous pressure.  Impressions:  - Normal LV size with mild LV hypertrophy. EF 25-30% with wall motion abnormalities as noted above in LAD infarction pattern. Moderate diastolic dysfunction . Normal RV size and systolic function. No significant valvular abnormalities. Moderate pulmonary hypertension.   2 D Echo: 09/17/14 Left ventricle: The base of the LV moves. There is hypokinesis of the mid ventricle and the apex. There has been some improvement since the recent echo. The motion abnormalities may could be consistent with a Takotsubo event. The estimated ejection fraction was 35%. - Mitral valve: Calcified annulus.    Medications: I have reviewed the patient's current medications. Scheduled Meds: . antiseptic oral rinse  7 mL Mouth  Rinse QID  . budesonide  0.5 mg Nebulization BID  . chlorhexidine  15 mL Mouth Rinse BID  . free water  250 mL Per Tube Q6H  . haloperidol lactate  5 mg Intravenous Once  . insulin aspart  0-15 Units Subcutaneous 6 times per day  . ipratropium-albuterol  3 mL Nebulization Q6H  . levETIRAcetam  500 mg Intravenous Q12H  . methylPREDNISolone (SOLU-MEDROL) injection  40 mg Intravenous Q12H  . niMODipine  60 mg Oral Q4H   Or  . NiMODipine  60 mg Per Tube Q4H  . pantoprazole (PROTONIX) IV  40 mg Intravenous QHS  . piperacillin-tazobactam (ZOSYN)  IV  3.375 g Intravenous Q8H  . potassium chloride  40 mEq Per Tube TID  . vancomycin  750  mg Intravenous Q12H   Continuous Infusions: . dextrose 10 mL/hr at 09/17/14 1742  . feeding supplement (VITAL AF 1.2 CAL) 1,000 mL (09/17/14 1552)  . fentaNYL infusion INTRAVENOUS 50 mcg/hr (09/18/14 1100)  . 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 Cardiomyopathy: EF 25-30% on admit. Initial 2D echo 9/16 demonstrated a pattern very suggestive of stress cardiomyopathy with wall motion abnormalities way out of proportion to the relatively small enzyme leak and they do not respect typical coronary anatomy. CHF meds were placed on hold yesterday due to low BP. BP still borderline. Will re-evaluate need for continuation of meds based on echo results. Follow up echo with improvement of EF to 35%  The motion abnormalities may could be consistent with a Takotsubo event  2. Elevated Cardiac Enzymes: Pk troponin 5.40 If EF not significantly improved then will plan for LHC once pt has improved.  We could just do diagnostic cath to eval.  If significantly improves, can opt for a noninvasive NST once acute illness resolves.   3. Progressive Dyspnea: Pt required re-intubation yesterday. Suspected aspiration PNA. On antibiotics per critical care.     LOS: 7 days   Time spent with pt. :15 minutes. [facility] R  Nurse Practitioner Certified Pager [phone] or after 5pm and on weekends call [phone] 09/18/2014, 12:08  PM  I have seen and evaluated the patient this PM  along with Cecilie Kicks, NP-C. I agree with her findings, examination as well as impression recommendations.  Remains intubated - but weaning well. Echo results reviewed - EF has improved some -- WMA is suggestive of possible TakoTsubo CM although cannot r/o LAD infarct. With no active CHF Sx - I am more inclined to think non-ischemic CM vs. Ischemic despite Troponin.   Would not consider LHC-Angio until we are sure that she is stable from a [facility] & respiratory status.  The difficult decision would be what to do if there is a LAD lesion with recent [place].     Recommendation for now is expectant management.  As BP increases will consider adding afterload reduction (but would need significant increase in BP to do this post CVA-SAH.  No plan to cath for now.  If no  c/o Anginal CP once stable & extubated - can discuss LHC-Angio vs. Myoview ST. NO ASA or anticoagulation with recent SAH.   Leonie Man, M.D., M.S. Interventional Cardiologist   Pager # [phone]

## 2014-09-18 NOTE — Progress Notes (Signed)
PT Cancellation Note  Patient Details Name: Mindy Mills MRN: [id number] DOB: [birth year]   Cancelled Treatment:    Reason Eval/Treat Not Completed: Medical issues which prohibited therapy (will sign off, request new orders when medically appropriate)   Duncan Dull 09/18/2014, 8:10 AM Alben Deeds, PT DPT  [phone]

## 2014-09-18 NOTE — Progress Notes (Signed)
PULMONARY / CRITICAL CARE MEDICINE   Name: Mindy Mills MRN: [id number] DOB: April 20, 1951    ADMISSION DATE:  09/11/2014 CONSULTATION DATE:  09/18/2014  REFERRING MD :  Kathyrn Sheriff  CHIEF COMPLAINT:  Seizure  INITIAL PRESENTATION: 63 y.o. F brought to [facility] ED on 9/15 after a witnessed syncopal episode followed by a grand mal seizure while in the car.  Upon ED arrival, pt was unresponsive and not moving her right side.  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  She was intubated and underwent A com aneurysm coiling by  Dr. Leighton Ruff.  PCCM was consulted.  She was reintubated 9/17 for pulmonary edema & hypoxia induced delerium PMH - hepc, smoker, chronic headaches on narcotics  STUDIES:  CTA Head 9/15 >>> large SAH, 2.5 x 45mm right ACA aneurysm as source of SAH. Echo 9/16 >>EF 25-30%, apical akinesis- LAD infarction pattern.Moderate diastolic dysfunction .  RVSP 50  SIGNIFICANT EVENTS: 9/15 - admitted with [facility] and right ACA aneurysm, underwent endovascular repair, returned to ICU on vent. 9/15 - A com aneurysm coiled 9/16 difficulty with panda 9/18 OGT placed by GI 9/21 VDRF, BiPAP overnight, required   SUBJECTIVE: Awake off drips this am Febrile Good UO off lasix CVP 1-12 range  VITAL SIGNS: Temp:  [99.1 F (37.3 C)-102.2 F (39 C)] 99.5 F (37.5 C) (09/22 0340) Pulse Rate:  [69-121] 69 (09/22 0700) Resp:  [12-24] 18 (09/22 0700) BP: (87-140)/(48-79) 115/59 mmHg (09/22 0700) SpO2:  [94 %-100 %] 99 % (09/22 0700) FiO2 (%):  [40 %-100 %] 40 % (09/22 0700) Weight:  [60.4 kg (133 lb 2.5 oz)] 60.4 kg (133 lb 2.5 oz) (09/22 0356)  HEMODYNAMICS: CVP:  [3 mmHg-7 mmHg] 6 mmHg  VENTILATOR SETTINGS: Vent Mode:  [-] PRVC FiO2 (%):  [40 %-100 %] 40 % Set Rate:  [8 bmp-14 bmp] 14 bmp Vt Set:  [500 mL] 500 mL PEEP:  [5 cmH20] 5 cmH20 Plateau Pressure:  [12 cmH20-16 cmH20] 14 cmH20  INTAKE / OUTPUT: Intake/Output     09/21 0701 - 09/22 0700 09/22 0701 - 09/23 0700   I.V.  (mL/kg) 93.3 (1.5)    NG/GT 490    IV Piggyback 4800    Total Intake(mL/kg) 5383.3 (89.1)    Urine (mL/kg/hr) 2075 (1.4)    Stool 100 (0.1)    Total Output 2175     Net +3208.3           PHYSICAL EXAMINATION: General: Chronically ill appearing elderly female,  Extubated but lethargic, on BiPAP. Neuro: lethargic off drips, non-focal, RASS +1 HEENT: [place]/AT. PERRL, sclerae anicteric. Cardiovascular: RRR, no M/R/G.  Lungs: Respirations even and unlabored. BL decreased, no rhonchi, on BiPAP Abdomen: BS x 4, soft, NT/ND.  Musculoskeletal: No gross deformities, no edema.  Skin: Intact, warm, no rashes.  LABS:  CBC  Recent Labs Lab 09/16/14 0415 09/17/14 0459 09/18/14 0520  WBC 12.1* 19.1* 17.3*  HGB 11.0* 12.8 11.4*  HCT 34.8* 40.6 36.5  PLT 198 274 228   Coag's  Recent Labs Lab 09/11/14 2030  APTT 26  INR 1.04   BMET  Recent Labs Lab 09/16/14 1830 09/17/14 0459 09/18/14 0520  NA 154* 152* 153*  K 3.3* 3.1* 3.3*  CL 118* 109 118*  CO2 24 29 25   BUN 20 19 18   CREATININE 0.50 0.60 0.56  GLUCOSE 157* 148* 125*   Electrolytes  Recent Labs Lab 09/12/14 0450  09/16/14 1830 09/17/14 0459 09/17/14 1050 09/18/14 0520  CALCIUM 7.5*  < >  8.3* 8.8  --  7.6*  MG 1.6  --   --   --  1.8 1.9  PHOS 2.2*  --   --   --  4.1 2.6  < > = values in this interval not displayed. Sepsis Markers No results found for this basename: LATICACIDVEN, PROCALCITON, O2SATVEN,  in the last 168 hours  ABG  Recent Labs Lab 09/16/14 0840 09/17/14 1035 09/18/14 0503  PHART 7.393 7.434 7.435  PCO2ART 39.9 42.2 35.7  PO2ART 80.0 230.0* 65.4*    Liver Enzymes  Recent Labs Lab 09/11/14 1304  AST 16  ALT 10  ALKPHOS 77  BILITOT 0.3  ALBUMIN 3.5   Cardiac Enzymes  Recent Labs Lab 09/11/14 2030 09/12/14 0215 09/12/14 0455 09/13/14 0524  TROPONINI 5.10* 5.40* 4.43* 1.62*  PROBNP 3137.0*  --   --   --     Glucose  Recent Labs Lab 09/17/14 0434 09/17/14 1153  09/17/14 1558 09/17/14 1956 09/17/14 2334 09/18/14 0339  GLUCAP 145* 170* 152* 122* 127* 141*    Imaging CXR - BL ASD -decreased edema pattern  ASSESSMENT / PLAN:  PULMONARY OETT 9/15 >>>9/16, 9/17 >>9/19, 9/21>>> A: VDRF - in setting of 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 Aspiration pneumonia P:   Reintubated, will continue full vent support for now. DuoNebs. Ct solumedrol 40 q 24 for now, once respiratory status improves will consider tapering to off.  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 Propofol drip. Keppra per neuro recs. Ct nimodipine Further recommendations per neuro surg.  CARDIOVASCULAR A:  Hx HTN Incomplete LBBB on EKG, nstemi Acute Pulmonary edema  NSTEMI - LAD pattern WMA on echo vs Takatsubo's P:  Cards consult appreciated Continue to hold lasix to avoid vasospasm.  RENAL A:   Hypokalemia Mild metabolic acidosis- resolved Hypernatremia   P:  Replace electrolytes as indicated. KVO IVF BMET in AM. Start free water for hypernatremia, allow to drift down slowly.  GASTROINTESTINAL A:  Hx HCV Difficult panda placement P:   SUP: Pantoprazole. TF per nutrition  HEMATOLOGIC A:   VTE Prophylaxis P:  SCD's only.  INFECTIOUS A:  Aspiration pneumonia resp cult 9/17 >> P:   D/C ceftx/ clinda Vanc 9/21>>> Zosyn 9/21>>> Pan culture 9/21>>>  ENDOCRINE A:   Hyperglycemia  P:   CBG's q4hr. SSI.  Summary - Suspect aspiration event, reintubated, now more stable from a respiratory standpoint, suspect will need a tracheostomy before this is over, continue broad spectrum abx as above, address metabolic concerns and will f/u, hold weaning trials for today.  I have personally obtained a history, examined the patient, evaluated laboratory and imaging results,  formulated the assessment and plan and placed orders.  CRITICAL CARE: The patient is critically ill with multiple organ systems failure and requires high complexity decision making for assessment and support, frequent evaluation and titration of therapies, application of advanced monitoring technologies and extensive interpretation of multiple databases. Critical Care Time devoted to patient care services described in this note is 35 minutes.   Rush Farmer, M.D. [facility] Pulmonary/Critical Care Medicine. Pager: [phone]. After hours pager: [phone].  09/18/2014  7:26 AM

## 2014-09-19 ENCOUNTER — Inpatient Hospital Stay (HOSPITAL_COMMUNITY): Payer: Medicare Other

## 2014-09-19 ENCOUNTER — Encounter (HOSPITAL_COMMUNITY): Payer: Self-pay | Admitting: *Deleted

## 2014-09-19 DIAGNOSIS — J9600 Acute respiratory failure, unspecified whether with hypoxia or hypercapnia: Secondary | ICD-10-CM

## 2014-09-19 LAB — PHOSPHORUS: Phosphorus: 3.3 mg/dL (ref 2.3–4.6)

## 2014-09-19 LAB — BLOOD GAS, ARTERIAL
Acid-base deficit: 0.2 mmol/L (ref 0.0–2.0)
Acid-base deficit: 0.3 mmol/L (ref 0.0–2.0)
BICARBONATE: 24.1 meq/L — AB (ref 20.0–24.0)
Bicarbonate: 23.8 mEq/L (ref 20.0–24.0)
Drawn by: 404151
Drawn by: 40662
FIO2: 0.4 %
FIO2: 0.4 %
MECHVT: 500 mL
MECHVT: 500 mL
O2 Saturation: 99.1 %
O2 Saturation: 97.8 %
PATIENT TEMPERATURE: 98.6
PCO2 ART: 38.3 mmHg (ref 35.0–45.0)
PEEP/CPAP: 5 cmH2O
PEEP/CPAP: 5 cmH2O
PH ART: 7.383 (ref 7.350–7.450)
PO2 ART: 152 mmHg — AB (ref 80.0–100.0)
PO2 ART: 112 mmHg — AB (ref 80.0–100.0)
Patient temperature: 98.6
RATE: 14 resp/min
RATE: 14 resp/min
TCO2: 25 mmol/L (ref 0–100)
TCO2: 25.4 mmol/L (ref 0–100)
pCO2 arterial: 41.4 mmHg (ref 35.0–45.0)
pH, Arterial: 7.41 (ref 7.350–7.450)

## 2014-09-19 LAB — BASIC METABOLIC PANEL
Anion gap: 11 (ref 5–15)
BUN: 16 mg/dL (ref 6–23)
CALCIUM: 8.1 mg/dL — AB (ref 8.4–10.5)
CO2: 25 mEq/L (ref 19–32)
Chloride: 116 mEq/L — ABNORMAL HIGH (ref 96–112)
Creatinine, Ser: 0.56 mg/dL (ref 0.50–1.10)
GFR calc Af Amer: >90 mL/min (ref >90)
Glucose, Bld: 98 mg/dL (ref 70–99)
Potassium: 4.3 mEq/L (ref 3.7–5.3)
SODIUM: 152 meq/L — AB (ref 137–147)

## 2014-09-19 LAB — GLUCOSE, CAPILLARY
Glucose-Capillary: 106 mg/dL — ABNORMAL HIGH (ref 70–99)
Glucose-Capillary: 111 mg/dL — ABNORMAL HIGH (ref 70–99)
Glucose-Capillary: 115 mg/dL — ABNORMAL HIGH (ref 70–99)
Glucose-Capillary: 129 mg/dL — ABNORMAL HIGH (ref 70–99)
Glucose-Capillary: 138 mg/dL — ABNORMAL HIGH (ref 70–99)
Glucose-Capillary: 161 mg/dL — ABNORMAL HIGH (ref 70–99)
Glucose-Capillary: 177 mg/dL — ABNORMAL HIGH (ref 70–99)

## 2014-09-19 LAB — CBC
HCT: 37.5 % (ref 36.0–46.0)
Hemoglobin: 11.5 g/dL — ABNORMAL LOW (ref 12.0–15.0)
MCH: 30.4 pg (ref 26.0–34.0)
MCHC: 30.7 g/dL (ref 30.0–36.0)
MCV: 99.2 fL (ref 78.0–100.0)
PLATELETS: 215 10*3/uL (ref 150–400)
RBC: 3.78 MIL/uL — ABNORMAL LOW (ref 3.87–5.11)
RDW: 15.2 % (ref 11.5–15.5)
WBC: 18.2 10*3/uL — AB (ref 4.0–10.5)

## 2014-09-19 LAB — MAGNESIUM: Magnesium: 1.9 mg/dL (ref 1.5–2.5)

## 2014-09-19 LAB — CULTURE, RESPIRATORY

## 2014-09-19 LAB — CULTURE, RESPIRATORY W GRAM STAIN: Gram Stain: NONE SEEN

## 2014-09-19 MED ORDER — FENTANYL BOLUS VIA INFUSION
50.0000 ug | INTRAVENOUS | Status: DC | PRN
  Filled 2014-09-19: qty 100

## 2014-09-19 MED ORDER — ETOMIDATE 2 MG/ML IV SOLN
0.3000 mg/kg | Freq: Once | INTRAVENOUS | Status: AC
  Administered 2014-09-19: 20 mg via INTRAVENOUS

## 2014-09-19 MED ORDER — CARVEDILOL 3.125 MG PO TABS
3.1250 mg | ORAL_TABLET | Freq: Two times a day (BID) | ORAL | Status: DC
  Filled 2014-09-19 (×4): qty 1

## 2014-09-19 MED ORDER — IOHEXOL 350 MG/ML SOLN
50.0000 mL | Freq: Once | INTRAVENOUS | Status: AC | PRN
  Administered 2014-09-19: 50 mL via INTRAVENOUS

## 2014-09-19 MED ORDER — MIDAZOLAM HCL 2 MG/2ML IJ SOLN
INTRAMUSCULAR | Status: AC
  Administered 2014-09-19: 4 mg via INTRAVENOUS
  Filled 2014-09-19: qty 2

## 2014-09-19 MED ORDER — SODIUM CHLORIDE 0.9 % IV SOLN
0.0000 ug/h | INTRAVENOUS | Status: DC
  Administered 2014-09-19: 75 ug/h via INTRAVENOUS
  Administered 2014-09-21: 100 ug/h via INTRAVENOUS
  Administered 2014-09-21: 50 ug/h via INTRAVENOUS
  Administered 2014-09-21: 75 ug/h via INTRAVENOUS
  Filled 2014-09-19 (×2): qty 50

## 2014-09-19 MED ORDER — FUROSEMIDE 20 MG PO TABS
20.0000 mg | ORAL_TABLET | Freq: Every day | ORAL | Status: DC
  Filled 2014-09-19 (×3): qty 1

## 2014-09-19 MED ORDER — FENTANYL CITRATE 0.05 MG/ML IJ SOLN
INTRAMUSCULAR | Status: AC
  Filled 2014-09-19: qty 2

## 2014-09-19 MED ORDER — ACETAMINOPHEN 10 MG/ML IV SOLN
1000.0000 mg | Freq: Once | INTRAVENOUS | Status: AC
  Administered 2014-09-19: 1000 mg via INTRAVENOUS
  Filled 2014-09-19: qty 100

## 2014-09-19 MED ORDER — MIDAZOLAM HCL 2 MG/2ML IJ SOLN
4.0000 mg | Freq: Once | INTRAMUSCULAR | Status: AC
  Administered 2014-09-19: 4 mg via INTRAVENOUS

## 2014-09-19 MED ORDER — MIDAZOLAM HCL 2 MG/2ML IJ SOLN
INTRAMUSCULAR | Status: AC
  Filled 2014-09-19: qty 2

## 2014-09-19 MED ORDER — FENTANYL CITRATE 0.05 MG/ML IJ SOLN
50.0000 ug | Freq: Once | INTRAMUSCULAR | Status: AC
  Administered 2014-09-19: 50 ug via INTRAVENOUS

## 2014-09-19 MED ORDER — FENTANYL CITRATE 0.05 MG/ML IJ SOLN: 200.0000 ug | Freq: Once | INTRAMUSCULAR | Status: DC

## 2014-09-19 NOTE — Progress Notes (Signed)
VASCULAR LAB PRELIMINARY  PRELIMINARY  PRELIMINARY  PRELIMINARY  Transcranial Doppler  Date POD PCO2 HCT BP  MCA ACA PCA OPHT SIPH VERT Basilar  9-16- 15 SB     Right  Left   42  27   -35  -31   28  24   15  13    36  45   -18  --     -31    9-18- 15 SB     Right  Left   62  39   --  -41   --  --   12  12   38  39   --  -29     --    9/21/ 15 MS  42.2 40.6 113/62 Right  Left   54  66   -100  -25   42  44   19  28   47  36   -22  -28     -28     09/19/14 MS  38.3 37.5 128/65 Right  Left   55  42   -35  -37   37  34   34  21   *  26   -21  -38   -31            Right  Left                                            Right  Left                                            Right  Left                                        MCA = Middle Cerebral Artery      OPHT = Opthalmic Artery     BASILAR = Basilar Artery   ACA = Anterior Cerebral Artery     SIPH = Carotid Siphon PCA = Posterior Cerebral Artery   VERT = Verterbral Artery                   Normal MCA = 62+\-12 ACA = 50+\-12 PCA = 42+\-23   *Unable to insonate    09/19/2014 2:32 PM Maudry Mayhew, RVT, RDCS, RDMS

## 2014-09-19 NOTE — Procedures (Signed)
Extubation Procedure Note  Patient Details:   Name: Mindy Mills DOB: 08/10/1951 MRN: [id number]   Airway Documentation:     Evaluation  O2 sats: stable throughout Complications: No apparent complications Patient did tolerate procedure well. Bilateral Breath Sounds: Rhonchi Suctioning: Oral;Airway Yes  Pt extubated to 3L [place]. No complications. Pt able to speak her name and DOB post extubation. No stridor noted. Diminished BS bilaterally. Little to no secretions post extubation. Incentive Spirometer Initiated by Stage manager aware.   [name] 09/19/2014, 12:13 PM

## 2014-09-19 NOTE — Progress Notes (Signed)
Pt not on bipap at this time

## 2014-09-19 NOTE — Evaluation (Signed)
Clinical/Bedside Swallow Evaluation Patient Details  Name: Mindy Mills MRN: [id number] Date of Birth: [birth year]  Today's Date: 09/19/2014 Time: 2458-0998 SLP Time Calculation (min): 0 min  Past Medical History:  Past Medical History  Diagnosis Date  . Hypertension   . Migraine   . Cancer     Cervicle  . Hepatitis C    Past Surgical History:  Past Surgical History  Procedure Laterality Date  . Abdominal hysterectomy    . Radiology with anesthesia N/A 09/11/2014    Procedure: Sub Arachnoid Aneurysm Coiling -RADIOLOGY WITH ANESTHESIA ;  Surgeon: Consuella Lose, MD;  Location: [place];  Service: Radiology;  Laterality: N/A;   HPI:  63 y.o. F brought to [facility] ED on 9/15 after a witnessed syncopal episode followed by a grand mal seizure while in the car. Upon ED arrival, pt was unresponsive and not moving her right side. 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 She was intubated and underwent endovascular procedure by Dr. Leighton Ruff.  Pt was intubated on admit, self extubated 9/16, reintubated 9/17, weaned and extubated 9/20, and reintubated 9/21, and weaned/extubated this am 09/19/14.     Assessment / Plan / Recommendation Clinical Impression  Pt was noted to be drooling from the right while sleeping.  Pt very lethargic, requiring max stimulation to arouse, then quickly falls back to sleep.  Pt did not f/c for oral motor exam, and required constant stimulation with wet cloth to face and sternal rub to remain awake for po's.  Held initial bolus orally and would not swallow even with max encouragement, and verbal/tactile cues.  SLP suctioned bolus from mouth.  Pt did swallow a small piece of ice, with delayed throat clearing noted.  Pt took 1/2 tsp of water from spoon with immediate wet/congested cough.  Recommend NPO with MBS or FEES when pt is alert and able to participate.  SLP will f/u 9/24 to assess for readiness for objective swallow study.  MD, please orderMBS or FEES if you  agree.    Aspiration Risk  Severe    Diet Recommendation NPO        Other  Recommendations Recommended Consults: MBS;FEES   Follow Up Recommendations       Frequency and Duration        Pertinent Vitals/Pain Temp spikes noted; Mild bibasilar pulmonary edema and small pleural effusions. Per CXR         Swallow Study Prior Functional Status       General HPI: 63 y.o. F brought to [facility] ED on 9/15 after a witnessed syncopal episode followed by a grand mal seizure while in the car. Upon ED arrival, pt was unresponsive and not moving her right side. 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 She was intubated and underwent endovascular procedure by Dr. Leighton Ruff.  Pt was intubated on admit, self extubated 9/16, reintubated 9/17, weaned and extubated 9/20, and reintubated 9/21, and weaned/extubated this am 09/19/14.   Type of Study: Bedside swallow evaluation Previous Swallow Assessment: BSE 9/16, (after only 1 intubation) Dys 2 Nectar recommended Diet Prior to this Study: NPO Temperature Spikes Noted: Yes Respiratory Status: Nasal cannula History of Recent Intubation: Yes Length of Intubations (days): 7 days Date extubated: 09/19/14 Behavior/Cognition: Lethargic;Requires cueing;Doesn't follow directions;Decreased sustained attention Oral Cavity - Dentition: Dentures, not available;Edentulous Self-Feeding Abilities: Total assist Patient Positioning: Upright in bed Baseline Vocal Quality: Low vocal intensity;Other (comment) Volitional Cough: Cognitively unable to elicit Volitional Swallow: Unable to elicit    Oral/Motor/Sensory  Function Overall Oral Motor/Sensory Function: Impaired Labial ROM: Reduced right;Reduced left Labial Symmetry:  (Unable to fully assess due to lethargy) Labial Strength: Reduced Lingual ROM: Reduced right;Reduced left Lingual Strength: Reduced   Ice Chips Ice chips: Impaired Presentation: Spoon Oral Phase Impairments: Reduced labial  seal;Reduced lingual movement/coordination;Impaired anterior to posterior transit Oral Phase Functional Implications: Oral holding;Oral residue Pharyngeal Phase Impairments: Suspected delayed Swallow;Decreased hyoid-laryngeal movement;Throat Clearing - Delayed   Thin Liquid Thin Liquid: Impaired Presentation: Spoon Pharyngeal  Phase Impairments: Suspected delayed Swallow;Decreased hyoid-laryngeal movement;Cough - Immediate    Nectar Thick Nectar Thick Liquid: Not tested   Honey Thick Honey Thick Liquid: Not tested   Puree Puree: Impaired Presentation: Spoon Oral Phase Impairments: Impaired anterior to posterior transit;Reduced lingual movement/coordination;Poor awareness of bolus Oral Phase Functional Implications: Prolonged oral transit;Oral holding;Oral residue Pharyngeal Phase Impairments: Suspected delayed Swallow;Multiple swallows   Solid   GO    Solid: Not tested       Quinn Axe T 09/19/2014,4:44 PM

## 2014-09-19 NOTE — Progress Notes (Signed)
Pt seen and examined this am prior to extubation. No issues overnight.   EXAM: Temp:  [97.7 F (36.5 C)-102.6 F (39.2 C)] 102.6 F (39.2 C) (09/23 1207) Pulse Rate:  [66-86] 86 (09/23 1207) Resp:  [12-19] 19 (09/23 1207) BP: (103-131)/(50-70) 116/54 mmHg (09/23 1207) SpO2:  [89 %-100 %] 89 % (09/23 1207) FiO2 (%):  [40 %] 40 % (09/23 1100) Weight:  [58.9 kg (129 lb 13.6 oz)] 58.9 kg (129 lb 13.6 oz) (09/23 0500) Intake/Output     09/22 0701 - 09/23 0700 09/23 0701 - 09/24 0700   I.V. (mL/kg) 604.3 (10.3) 70 (1.2)   Other 1000    NG/GT 1500 200   IV Piggyback 650 175   Total Intake(mL/kg) 3754.3 (63.7) 445 (7.6)   Urine (mL/kg/hr) 1690 (1.2) 425 (1.3)   Stool     Total Output 1690 425   Net +2064.3 +20         On low-dose sedation: Drowsy but arousable Opens eyes, nods to questions. Moves all extremities to command ?slightly weaker in RUE/RLE but generlized weakness is present  LABS: Lab Results  Component Value Date   CREATININE 0.56 09/19/2014   BUN 16 09/19/2014   NA 152* 09/19/2014   K 4.3 09/19/2014   CL 116* 09/19/2014   CO2 25 09/19/2014   Lab Results  Component Value Date   WBC 18.2* 09/19/2014   HGB 11.5* 09/19/2014   HCT 37.5 09/19/2014   MCV 99.2 09/19/2014   PLT 215 09/19/2014    IMPRESSION:  - 63 y.o. female SAHd# 9 s/p Acom coiling, appears generally stable from neurologic standpoint, however subtle asymetry in strength is more apparent today.  - ?Aspiration Pna with VDRF, extubated today - Slightly improved cardiomyopathy c/w Takotsubo   PLAN:  - Cont close neurologic observation. Now extubated, seadatives discontinued will allow more accurate neurologic exam. - I have ordered CTA/CTP to evaluate for spasm - Fluid mgmt for euvolemia, CVP goal > 8  - On broad spectrum Abx for pna

## 2014-09-19 NOTE — Progress Notes (Signed)
CTA reviewed. Demonstrates widely patent bilateral ICA, bilateral MCA, and bilateral VA/BA. There does appear to be signal loss in the mid-distal right A1 but this may be related to artifact from the adjacent coils. There is good filling of the bilateral distal A2 segments.  Will plan on maintaining euvolemia and normotension for now. I suspect her depressed mental status is due more to possible infectious etiology and/or sedatives she has been on for the past few days. Will hold sedatives unless she requires them for adequate ventilator synchrony.

## 2014-09-19 NOTE — Procedures (Signed)
Procedure Note:  Orotracheal Intubation Implied consent due to emergent nature of patient's condition. Correct Patient, Name & ID confirmed.  The patient was pre-oxygenated and then, under direct visualization, a 7.5 mm cuffed endotracheal tube was placed through the vocal cords into the trachea. Total attempts made 1, using glidescope, no swelling for VC, mild edema of false cords, thick\white\prulent secretions from the ETT.  During intubation an assistant applied gentle pressure to the cricoid cartilage.  Position confirmed by auscultation of lungs (good breath sounds bilaterally) and no stomach sounds. Tube secured at 23 cm. Pulse ox 100 %. CO2 detector in place with appropriate color change.  Pt tolerated procedure well.  No complications were noted.   Intubation medication Versed - 4mg  Fentanyl - 226mcg Etomidate - 20mg     Vilinda Boehringer, MD [place] [facility] Pager [phone] On Call Pager [phone]

## 2014-09-19 NOTE — Progress Notes (Signed)
Transported pt to CT and radiology for CXR on ventilator. No complications.

## 2014-09-19 NOTE — Progress Notes (Signed)
OG/NG attempted x3 with no success. Dr. Alva Garnet and Dr. Christella Noa notified. MD's both okay with waiting until morning to address.

## 2014-09-19 NOTE — Progress Notes (Addendum)
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 F admitted on 09/11/14 after a witnessed syncopal episode followed by a grand mal seizure. Unresponsive w/ R hemiparesis on admit. CTA head revealed large SAH and 2.5 x 4 mm right ACA aneurysm as the source of the [facility] s/p intubation & coil embolization of the aneurysm 09/15. She ruled in for a NSTEMI with peak troponin 5.4. ECG has shown poor anterior R wave progression and initial echo w/ EF 25- 30% with LAD infarct pattern and grade 2 diastolic dysfunction. BNP 3137. Cards following for NSTEMI, CM.    SUBJECTIVE: Pt just extubated, not talking yet. Awake and responds to questions.  OBJECTIVE Filed Vitals:   09/19/14 0700 09/19/14 0800 09/19/14 0811 09/19/14 0900  BP: 115/50 107/62 107/62 118/56  Pulse: 73 73 78 72  Temp: 97.7 F (36.5 C)     TempSrc: Oral     Resp: 18 19 19 17   Height:      Weight:      SpO2: 96% 95% 98% 94%    Intake/Output Summary (Last 24 hours) at 09/19/14 1145 Last data filed at 09/19/14 1100  Gross per 24 hour  Intake 3429.3 ml  Output   1615 ml  Net 1814.3 ml   Filed Weights   09/17/14 0400 09/18/14 0356 09/19/14 0500  Weight: 123 lb 10.9 oz (56.1 kg) 133 lb 2.5 oz (60.4 kg) 129 lb 13.6 oz (58.9 kg)    PHYSICAL EXAM General: Well developed, well nourished, female in no acute distress. Head: Normocephalic, atraumatic.  Neck: Supple without bruits, JVD 9-10 cm. Lungs:  Resp regular and unlabored, rales bases. Heart: RRR, S1, S2, no S3, S4, or murmur; no rub. Abdomen: Soft, non-tender, non-distended, BS + x 4.  Extremities: No clubbing, cyanosis,  edema.  Neuro: Alert and oriented X 3. Moves all extremities spontaneously. Psych: Normal affect.  LABS: CBC: Recent Labs  09/18/14 0520 09/19/14 0613  WBC 17.3* 18.2*  HGB 11.4* 11.5*  HCT 36.5 37.5  MCV 96.3 99.2  PLT 228 716   Basic Metabolic Panel: Recent Labs  09/18/14 0520 09/19/14 0613  NA 153* 152*  K 3.3* 4.3  CL 118* 116*  CO2 25 25  GLUCOSE 125* 98  BUN 18 16  CREATININE 0.56 0.56  CALCIUM 7.6* 8.1*  MG 1.9 1.9  PHOS 2.6 3.3   BNP: Pro B Natriuretic peptide (BNP)  Date/Time Value Ref Range Status  09/11/2014  8:30 PM 3137.0* 0 - 125 pg/mL Final   TELE:  SR, ST, 4 bt run NSVT, but very little ectopy in general    ECHO: 09/17/2014  Study Conclusions - Left ventricle: The base of the LV moves. There is hypokinesis of the mid ventricle and the apex. There has been some improvement since the recent echo. The motion abnormalities may could be consistent with a Takotsubo event. The estimated ejection fraction was 35%. - Mitral valve: Calcified annulus.   Radiology/Studies: Dg Chest Port 1 View 09/19/2014   CLINICAL DATA:  Intubation.  EXAM: PORTABLE CHEST - 1 VIEW  COMPARISON:  09/19/2014.  09/16/2014.  FINDINGS: Endotracheal tube, NG tube,  right PICC are in stable position. Small bilateral pulmonary edema and small pleural effusions. Stable cardiomegaly. Note pneumothorax. Old left clavicular fracture.  IMPRESSION: 1.  Lines and tubes in stable position.  2.  Mild bibasilar pulmonary edema and small pleural effusions.   Electronically Signed   By: Marcello Moores  Register   On: 09/19/2014 07:59   Dg Chest Port 1 View 09/18/2014   CLINICAL DATA:  Acute respiratory failure.  EXAM: PORTABLE CHEST - 1 VIEW  COMPARISON:  09/17/2014  FINDINGS: Endotracheal tube, central catheter, and NG tube appear in good position. Heart size and vascularity are normal. Interstitial accentuation has resolved. Small bilateral pleural effusions, essentially unchanged.  IMPRESSION:  Interstitial accentuation seen on the prior study which may have represented slight pulmonary edema has resolved. Small bilateral pleural effusions.   Electronically Signed   By: Rozetta Nunnery M.D.   On: 09/18/2014 07:50     Current Medications:  . antiseptic oral rinse  7 mL Mouth Rinse QID  . budesonide  0.5 mg Nebulization BID  . chlorhexidine  15 mL Mouth Rinse BID  . free water  250 mL Per Tube Q6H  . haloperidol lactate  5 mg Intravenous Once  . insulin aspart  0-15 Units Subcutaneous 6 times per day  . ipratropium-albuterol  3 mL Nebulization Q6H  . levETIRAcetam  500 mg Intravenous Q12H  . methylPREDNISolone (SOLU-MEDROL) injection  40 mg Intravenous Q12H  . niMODipine  60 mg Oral Q4H   Or  . NiMODipine  60 mg Per Tube Q4H  . pantoprazole (PROTONIX) IV  40 mg Intravenous QHS  . piperacillin-tazobactam (ZOSYN)  IV  3.375 g Intravenous Q8H  . vancomycin  750 mg Intravenous Q12H   . dextrose 10 mL/hr at 09/19/14 0500  . feeding supplement (VITAL AF 1.2 CAL) 1,000 mL (09/19/14 0500)  . fentaNYL infusion INTRAVENOUS Stopped (09/19/14 1115)  . propofol Stopped (09/19/14 0800)    ASSESSMENT AND PLAN: Active Problems:   Subarachnoid hemorrhage due to ruptured aneurysm - per CCM, NS    Acute respiratory failure - per CCM, high risk for re-intubation (again); has some extra volume by CXR and exam, diuretics per MD.    Hypokalemia - per CCM, improved    Altered mental status - per CCM    Subarachnoid hemorrhage - see above    Demand ischemia of myocardium: in setting of CVA - No ASA/anticoagulation due to [place], BP has been low at times, so not previously on BB, MD advise on starting Lopressor 2.5 mg q 6 hr till taking POs. No statin since NPO.    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  Otherwise, per CCM.  Signed, Rosaria Ferries , PA-C 11:45  AM 09/19/2014  have seen and evaluated the patient this PM along with Rosaria Ferries , PA-C. I agree with her findings, examination as well as impression recommendations.  Extubated today - but weaning well.   Echo results reviewed - EF has improved some -- WMA is suggestive of possible TakoTsubo CM although cannot r/o LAD infarct.  With no active CHF Sx - I am more inclined to think non-ischemic CM vs. Ischemic despite Troponin.  Would not consider LHC-Angio until we are sure that she is stable from a [facility] & respiratory status. The difficult decision would be what to do if there is a LAD lesion with recent [place].  Recommendation for now  is expectant management.   CXR this AM with mild edema - may need low dose standing diuretic to avoid volume overload..  As BP starts to stabilized -- consider adding low dose BB   No plan to cath for now. If no c/o Anginal CP once stable & extubated - can discuss LHC-Angio vs. Myoview ST -- most prudent COA would be recheck Echo +/- Myoview ~ 1 month post d/c, provided she remains stable from a CV standpoint. .  NO ASA or anticoagulation with recent SAH.   Leonie Man, M.D., M.S. Interventional Cardiologist   Pager # [phone]

## 2014-09-19 NOTE — Procedures (Signed)
Intubation Procedure Note Mindy Mills [id number] Oct 18, 1951  Procedure: Intubation Indications: Airway protection and maintenance  Procedure Details Consent: Unable to obtain consent because of altered level of consciousness. Time Out: Verified patient identification, verified procedure, site/side was marked, verified correct patient position, special equipment/implants available, medications/allergies/relevent history reviewed, required imaging and test results available.  Performed  Maximum sterile technique was used including gloves, gown and hand hygiene.  3    Evaluation Hemodynamic Status: BP stable throughout; O2 sats: stable throughout Patient's Current Condition: stable Complications: No apparent complications Patient did tolerate procedure well. Chest X-ray ordered to verify placement.  CXR: pending.   Pt reintubated for airway protection by Dr Shearon Stalls using a glidescope #3 with 7.5 ett on 1st attempt. No complications. Pt has copious amounts of pale yellow/white secretions through ett post intubation. Ett placement confirmed with etco2 detector, bilateral breath sounds, with direct visualization through the vocal cords. CXR pending. ETT secured with tube holder.  Pt resumed on previous settings PRVC 500/R14/+5/40%.    Mindy Mills 09/19/2014

## 2014-09-19 NOTE — Progress Notes (Signed)
PULMONARY / CRITICAL CARE MEDICINE   Name: Mindy Mills MRN: [id number] DOB: 09/15/1951    ADMISSION DATE:  09/11/2014 CONSULTATION DATE:  09/19/2014  REFERRING MD :  Kathyrn Sheriff  CHIEF COMPLAINT:  Seizure  INITIAL PRESENTATION: 63 y.o. F brought to [facility] ED on 9/15 after a witnessed syncopal episode followed by a grand mal seizure while in the car.  Upon ED arrival, pt was unresponsive and not moving her right side.  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  She was intubated and underwent A com aneurysm coiling by  Dr. Leighton Ruff.  PCCM was consulted.  She was reintubated 9/17 for pulmonary edema & hypoxia induced delerium PMH - hepc, smoker, chronic headaches on narcotics  STUDIES:  CTA Head 9/15 >>> large SAH, 2.5 x 84mm right ACA aneurysm as source of SAH. Echo 9/16 >>EF 25-30%, apical akinesis- LAD infarction pattern.Moderate diastolic dysfunction .  RVSP 50  SIGNIFICANT EVENTS: 9/15 - admitted with [facility] and right ACA aneurysm, underwent endovascular repair, returned to ICU on vent. 9/15 - A com aneurysm coiled 9/16 difficulty with panda 9/18 OGT placed by GI 9/21 VDRF, BiPAP overnight, required   SUBJECTIVE: Awake off drips this am Febrile Good UO off lasix  VITAL SIGNS: Temp:  [97.7 F (36.5 C)-100.1 F (37.8 C)] 97.7 F (36.5 C) (09/23 0700) Pulse Rate:  [65-78] 72 (09/23 0900) Resp:  [12-21] 17 (09/23 0900) BP: (103-131)/(50-70) 118/56 mmHg (09/23 0900) SpO2:  [94 %-100 %] 94 % (09/23 0900) FiO2 (%):  [40 %] 40 % (09/23 0900) Weight:  [58.9 kg (129 lb 13.6 oz)] 58.9 kg (129 lb 13.6 oz) (09/23 0500)  HEMODYNAMICS: CVP:  [4 mmHg-6 mmHg] 5 mmHg  VENTILATOR SETTINGS: Vent Mode:  [-] CPAP;PSV FiO2 (%):  [40 %] 40 % Set Rate:  [14 bmp] 14 bmp Vt Set:  [500 mL] 500 mL PEEP:  [5 cmH20] 5 cmH20 Pressure Support:  [8 cmH20] 8 cmH20 Plateau Pressure:  [11 cmH20-14 cmH20] 12 cmH20  INTAKE / OUTPUT: Intake/Output     09/22 0701 - 09/23 0700 09/23 0701 - 09/24  0700   I.V. (mL/kg) 604.3 (10.3) 20 (0.3)   Other 1000    NG/GT 1500 100   IV Piggyback 650    Total Intake(mL/kg) 3754.3 (63.7) 120 (2)   Urine (mL/kg/hr) 1690 (1.2) 325 (1.4)   Stool     Total Output 1690 325   Net +2064.3 -205         PHYSICAL EXAMINATION: General: Chronically ill appearing elderly female,  Intubated but following commands. Neuro: Follows commands, alert and interactive HEENT: [place]/AT. PERRL, sclerae anicteric. Cardiovascular: RRR, no M/R/G.  Lungs: Coarse BS diffusely but clearing Abdomen: BS x 4, soft, NT/ND.  Musculoskeletal: No gross deformities, no edema.  Skin: Intact, warm, no rashes.  LABS:  CBC  Recent Labs Lab 09/17/14 0459 09/18/14 0520 09/19/14 0613  WBC 19.1* 17.3* 18.2*  HGB 12.8 11.4* 11.5*  HCT 40.6 36.5 37.5  PLT 274 228 215   Coag's No results found for this basename: APTT, INR,  in the last 168 hours  BMET  Recent Labs Lab 09/17/14 0459 09/18/14 0520 09/19/14 0613  NA 152* 153* 152*  K 3.1* 3.3* 4.3  CL 109 118* 116*  CO2 29 25 25   BUN 19 18 16   CREATININE 0.60 0.56 0.56  GLUCOSE 148* 125* 98   Electrolytes  Recent Labs Lab 09/17/14 0459 09/17/14 1050 09/18/14 0520 09/19/14 0613  CALCIUM 8.8  --  7.6* 8.1*  MG  --  1.8 1.9 1.9  PHOS  --  4.1 2.6 3.3   Sepsis Markers No results found for this basename: LATICACIDVEN, PROCALCITON, O2SATVEN,  in the last 168 hours  ABG  Recent Labs Lab 09/17/14 1035 09/18/14 0503 09/19/14 0358  PHART 7.434 7.435 7.410  PCO2ART 42.2 35.7 38.3  PO2ART 230.0* 65.4* 152.0*    Liver Enzymes No results found for this basename: AST, ALT, ALKPHOS, BILITOT, ALBUMIN,  in the last 168 hours Cardiac Enzymes  Recent Labs Lab 09/13/14 0524  TROPONINI 1.62*   Glucose  Recent Labs Lab 09/18/14 0748 09/18/14 1158 09/18/14 1558 09/18/14 2325 09/19/14 0333 09/19/14 0742  GLUCAP 115* 186* 120* 122* 129* 138*    Imaging CXR - BL ASD -decreased edema  pattern  ASSESSMENT / PLAN:  PULMONARY OETT 9/15 >>>9/16, 9/17 >>9/19, 9/21>>> A: VDRF - in setting of 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 Aspiration pneumonia P:   Extubate today, spoke with family, ok with trach, if fails will reintubate and trach later this week. DuoNebs. Ct solumedrol 40 q 12 for now, once respiratory status improves will consider tapering to off.  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 Keppra per neuro recs. Ct nimodipine Further recommendations per neuro surg.  CARDIOVASCULAR A:  Hx HTN Incomplete LBBB on EKG, nstemi Acute Pulmonary edema  NSTEMI - LAD pattern WMA on echo vs Takatsubo's P:  Cards consult appreciated Continue to hold lasix to avoid vasospasm.  RENAL A:   Hypokalemia Mild metabolic acidosis- resolved Hypernatremia   P:  Replace electrolytes as indicated. KVO IVF BMET in AM. Cont free water for hypernatremia, allow to drift down slowly.  GASTROINTESTINAL A:  Hx HCV Difficult panda placement P:   SUP: Pantoprazole. Hold TF for extubation, will need swallow evaluation today  HEMATOLOGIC A:   VTE Prophylaxis P:  SCD's only.  INFECTIOUS A:  Aspiration pneumonia resp cult 9/17 >> P:   D/C ceftx/ clinda Vanc 9/21>>> Zosyn 9/21>>> Pan culture 9/21>>>NTD  ENDOCRINE A:   Hyperglycemia  P:   CBG's q4hr. SSI.  Summary - Extubate today, if fails, will reintubate then trach later this week per family's wishes.  I have personally obtained a history, examined the patient, evaluated laboratory and imaging results, formulated the assessment and plan and placed orders.  CRITICAL CARE: The patient is critically ill with multiple organ systems failure and requires high complexity decision making for assessment and support, frequent evaluation  and titration of therapies, application of advanced monitoring technologies and extensive interpretation of multiple databases. Critical Care Time devoted to patient care services described in this note is 35 minutes.   Rush Farmer, M.D. [facility] Pulmonary/Critical Care Medicine. Pager: [phone]. After hours pager: [phone].  09/19/2014  11:00 AM

## 2014-09-19 NOTE — Progress Notes (Signed)
Patient with worsening mentation and respiratory status. She was very somnolent, would wake up the sternal rub, but quickly fall back asleep during mid conversation.  Noted snoring and pursed lip breathing.   Patient intubated due to inability to protect her airway, worsening mentation, unable to clear secretions.     Vilinda Boehringer, MD [place] [facility] Pager [phone] On Call Pager [phone]

## 2014-09-19 NOTE — Progress Notes (Signed)
Patient was extubated and sedation orders discontinued. Fentanyl drip stopped. 40 ml of Fentanyl, with a concentration of 50 mg in 250 ml bag, wasted in sink and witnessed by Tammy B., RN.

## 2014-09-20 ENCOUNTER — Inpatient Hospital Stay (HOSPITAL_COMMUNITY): Payer: Medicare Other

## 2014-09-20 LAB — GLUCOSE, CAPILLARY
GLUCOSE-CAPILLARY: 145 mg/dL — AB (ref 70–99)
GLUCOSE-CAPILLARY: 117 mg/dL — AB (ref 70–99)
Glucose-Capillary: 108 mg/dL — ABNORMAL HIGH (ref 70–99)
Glucose-Capillary: 111 mg/dL — ABNORMAL HIGH (ref 70–99)
Glucose-Capillary: 146 mg/dL — ABNORMAL HIGH (ref 70–99)
Glucose-Capillary: 102 mg/dL — ABNORMAL HIGH (ref 70–99)

## 2014-09-20 LAB — CBC
HEMATOCRIT: 36.4 % (ref 36.0–46.0)
Hemoglobin: 11.2 g/dL — ABNORMAL LOW (ref 12.0–15.0)
MCH: 29.3 pg (ref 26.0–34.0)
MCHC: 30.8 g/dL (ref 30.0–36.0)
MCV: 95.3 fL (ref 78.0–100.0)
Platelets: 237 10*3/uL (ref 150–400)
RBC: 3.82 MIL/uL — ABNORMAL LOW (ref 3.87–5.11)
RDW: 14.5 % (ref 11.5–15.5)
WBC: 15.5 10*3/uL — ABNORMAL HIGH (ref 4.0–10.5)

## 2014-09-20 LAB — BASIC METABOLIC PANEL
Anion gap: 10 (ref 5–15)
BUN: 13 mg/dL (ref 6–23)
CHLORIDE: 110 meq/L (ref 96–112)
CO2: 27 mEq/L (ref 19–32)
CREATININE: 0.51 mg/dL (ref 0.50–1.10)
Calcium: 8.1 mg/dL — ABNORMAL LOW (ref 8.4–10.5)
GFR calc non Af Amer: >90 mL/min (ref >90)
Glucose, Bld: 105 mg/dL — ABNORMAL HIGH (ref 70–99)
Potassium: 3.7 mEq/L (ref 3.7–5.3)
Sodium: 147 mEq/L (ref 137–147)

## 2014-09-20 LAB — MAGNESIUM: Magnesium: 1.8 mg/dL (ref 1.5–2.5)

## 2014-09-20 LAB — PHOSPHORUS: Phosphorus: 3.5 mg/dL (ref 2.3–4.6)

## 2014-09-20 MED ORDER — ETOMIDATE 2 MG/ML IV SOLN
40.0000 mg | Freq: Once | INTRAVENOUS | Status: AC
Start: 2014-09-21 — End: 2014-09-21
  Administered 2014-09-21: 20 mg via INTRAVENOUS
  Filled 2014-09-20: qty 20

## 2014-09-20 MED ORDER — VECURONIUM BROMIDE 10 MG IV SOLR
10.0000 mg | Freq: Once | INTRAVENOUS | Status: AC
Start: 2014-09-21 — End: 2014-09-21
  Administered 2014-09-21: 7 mg via INTRAVENOUS
  Filled 2014-09-20: qty 10

## 2014-09-20 MED ORDER — PROPOFOL 10 MG/ML IV EMUL: 5.0000 ug/kg/min | Freq: Once | INTRAVENOUS | Status: DC

## 2014-09-20 MED ORDER — MIDAZOLAM HCL 2 MG/2ML IJ SOLN: 4.0000 mg | Freq: Once | INTRAMUSCULAR | Status: DC

## 2014-09-20 MED ORDER — FENTANYL CITRATE 0.05 MG/ML IJ SOLN: 200.0000 ug | Freq: Once | INTRAMUSCULAR | Status: DC

## 2014-09-20 MED ORDER — PROPOFOL 10 MG/ML IV EMUL
5.0000 ug/kg/min | Freq: Once | INTRAVENOUS | Status: AC
  Administered 2014-09-21: 10 ug/kg/min via INTRAVENOUS

## 2014-09-20 MED ORDER — ETOMIDATE 2 MG/ML IV SOLN
40.0000 mg | Freq: Once | INTRAVENOUS | Status: DC
  Filled 2014-09-20: qty 20

## 2014-09-20 MED ORDER — MIDAZOLAM HCL 2 MG/2ML IJ SOLN
4.0000 mg | Freq: Once | INTRAMUSCULAR | Status: AC
Start: 2014-09-21 — End: 2014-09-21
  Administered 2014-09-21: 2 mg via INTRAVENOUS
  Filled 2014-09-20: qty 4

## 2014-09-20 MED ORDER — FENTANYL CITRATE 0.05 MG/ML IJ SOLN
200.0000 ug | Freq: Once | INTRAMUSCULAR | Status: AC
  Filled 2014-09-20: qty 4

## 2014-09-20 MED ORDER — VECURONIUM BROMIDE 10 MG IV SOLR
10.0000 mg | Freq: Once | INTRAVENOUS | Status: AC
  Administered 2014-09-21: 10 mg via INTRAVENOUS
  Filled 2014-09-20 (×2): qty 10

## 2014-09-20 NOTE — Progress Notes (Signed)
[place] for Vancomycin, Zosyn Indication: pneumonia  Allergies  Allergen Reactions  . Bee Venom Anaphylaxis and Hives   Assessment: 63 year old female [place] now s/p Acom coiling Extubated 9/20 Now with progressive dyspnea requiring re-intubation today 9/21 possibly secondary to aspiration pneumonia  Trach and PEG planned for 9/25 Cultures negative   Goal of Therapy:  Vancomycin trough level 15-20 mcg/ml Appropriate Zosyn dosing  Plan:  1) Continue Zosyn 3.375 grams iv Q 8 hours 2) Continue Vancomycin 750 mg iv Q 12 hours - consider dc? 3) Continue to follow progress, fever trend, cultures      Patient Measurements: Height: 5\' 3"  (160 cm) Weight: 127 lb 10.3 oz (57.9 kg) IBW/kg (Calculated) : 52.4  Labs:  Recent Labs  09/18/14 0520 09/19/14 0613 09/20/14 0540  WBC 17.3* 18.2* 15.5*  HGB 11.4* 11.5* 11.2*  PLT 228 215 237  CREATININE 0.56 0.56 0.51    Thank you. Anette Guarneri, PharmD [phone]  09/20/2014,11:11 AM

## 2014-09-20 NOTE — Progress Notes (Signed)
PT Cancellation Note  Patient Details Name: Mindy Mills MRN: [id number] DOB: Apr 04, 1951   Cancelled Treatment:    Reason Eval/Treat Not Completed: Medical issues which prohibited therapy (remains intubated)   Duncan Dull 09/20/2014, 3:04 PM Alben Deeds, [place] DPT  [phone]

## 2014-09-20 NOTE — Progress Notes (Signed)
Patient Name: Mindy Mills Date of Encounter: 09/20/2014   Patient Profile: 63 y.o. F admitted on 09/11/14 after a witnessed syncopal episode followed by a grand mal seizure. Unresponsive w/ R hemiparalysis on admit. 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 She ruled in for a NSTEMI with peak troponin 5.4. ECG has shown poor anterior R wave progression and initial echo w/ EF 25- 30% with LAD infarct pattern and grade 2 diastolic dysfunction. BNP 3137. Cards following for NSTEMI, CM.  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 No ASA in setting of SAH, MD advise on statin. No ischemic eval planned until after recovery.    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  -- if BP tolerates, may consider afterload reduction.    Bradycardia - MD review telemetry, bradycardia is sinus at times, other times P waves are inverted or PR is narrow. D/c BB for now.     SUBJECTIVE: Awake on the vent, breathing OK and no chest pain  OBJECTIVE Filed Vitals:   09/20/14 0720 09/20/14 0721 09/20/14 0800 09/20/14  0900  BP: 114/56  118/53 123/63  Pulse: 59  65 67  Temp:   100.9 F (38.3 C)   TempSrc:   Axillary   Resp: 15  15 12   Height:      Weight:      SpO2: 96% 96% 97% 97%    Intake/Output Summary (Last 24 hours) at 09/20/14 1024 Last data filed at 09/20/14 0900  Gross per 24 hour  Intake 911.25 ml  Output   1085 ml  Net -173.75 ml   Filed Weights   09/18/14 0356 09/19/14 0500 09/20/14 0050  Weight: 133 lb 2.5 oz (60.4 kg) 129 lb 13.6 oz (58.9 kg) 127 lb 10.3 oz (57.9 kg)    PHYSICAL EXAM General: Well developed, well nourished, female in no acute distress. Head: Normocephalic, atraumatic.  Neck: Supple without bruits, JVD not elevated. Lungs:  Resp regular and unlabored, few rales bases. Heart: RRR, S1, S2, no S3, S4, or murmur; no rub. Abdomen: Soft, non-tender, non-distended, BS + x 4.  Extremities: No clubbing, cyanosis, edema.  Neuro: Alert and oriented X 3. Moves all extremities spontaneously. Psych: Normal affect.  LABS: CBC: Recent Labs  09/19/14 0613 09/20/14 0540  WBC 18.2* 15.5*  HGB 11.5* 11.2*  HCT 37.5 36.4  MCV 99.2 95.3  PLT 215 425   Basic Metabolic Panel: Recent Labs  09/19/14 0613 09/20/14 0540  NA 152* 147  K 4.3 3.7  CL 116*  110  CO2 25 27  GLUCOSE 98 105*  BUN 16 13  CREATININE 0.56 0.51  CALCIUM 8.1* 8.1*  MG 1.9 1.8  PHOS 3.3 3.5   BNP: Pro B Natriuretic peptide (BNP)  Date/Time Value Ref Range Status  09/11/2014  8:30 PM 3137.0* 0 - 125 pg/mL Final    TELE:  SR, ST, also with short PR complexes and inverted P waves at times. HR not sustained < 50.    Radiology/Studies: Ct Angio Head W/cm &/or Wo Cm 09/19/2014   CLINICAL DATA:  Vasospasm. Recent subarachnoid hemorrhage with coil embolization of right ACA aneurysm.  EXAM: CT ANGIOGRAPHY HEAD  TECHNIQUE: Multidetector CT imaging of the head was performed using the standard protocol during bolus administration of intravenous contrast. Multiplanar CT image reconstructions and MIPs  were obtained to evaluate the vascular anatomy.  CONTRAST:  38mL OMNIPAQUE IOHEXOL 350 MG/ML SOLN  COMPARISON:  Head CTA 09/11/2014. Cerebral angiogram 09/11/2014. Head CT 09/12/2014.  FINDINGS: Diffuse subarachnoid hemorrhage has decreased from the prior CT. Blood in the occipital horns of the lateral ventricles has also mildly decreased. Ventricles are stable to minimally decreased in size compared to the prior CT. There is no midline shift. No definite acute large territory infarct is identified. Aneurysm coils are seen in the region the proximal ACAs. No abnormal enhancement is identified. Orbits are unremarkable. Endotracheal tube is partially visualized. Mastoid air cells and paranasal sinuses are clear.  Visualized distal vertebral arteries are patent and codominant. PICA origins are patent. A ICA and SCA origins are patent. Basilar artery is patent without stenosis. PCAs are unremarkable. Posterior communicating arteries are not clearly identified.  Internal carotid arteries are patent from skullbase to carotid termini. Mild narrowing of the distal features/proximal cavernous ICA on the left is unchanged. Mild atherosclerotic calcification is noted of the cavernous carotids bilaterally, with mild diffuse left cavernous carotid irregularity.  Evaluation of the proximal ACAs is partially limited by streak artifact from coils at the site of previously described anterior communicating artery aneurysm. There is moderate, diffuse narrowing of the mid and distal A1 segments bilaterally which appears more prominent than on the prior CTA, particularly on the right. No clear residual aneurysm is identified at the site of coronal embolization, but evaluation is limited by artifact. A2 segments appear patent. There is moderate irregular narrowing of the proximal left A2 segment which is slightly more prominent than on the prior CTA. Mild to moderate irregular narrowing is present in both A2 segments more distally. The  MCAs are unremarkable.  Review of the MIP images confirms the above findings.  IMPRESSION: 1. 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 2. No major intracranial arterial occlusion. Moderate narrowing of the mid to distal A1 segments and intermittent narrowing of both A2 segments appears mildly increased from the prior CTA and may reflect a degree of vasospasm.   Electronically Signed   By: Logan Bores   On: 09/19/2014 20:02   Dg Chest 1 View 09/19/2014   CLINICAL DATA:  63 year old female with intubation.  EXAM: CHEST - 1 VIEW  COMPARISON:  09/19/2014.  Multiple prior plain film  FINDINGS: Cardiomediastinal silhouette unchanged in size and contour. Atherosclerotic calcifications of the aortic arch.  Endotracheal tube remains in position, terminating approximately 5 cm above the carina. Right-sided subclavian central line appears to terminate in superior vena cava. Interval removal of gastric tube. Overlying EKG leads.  Persisting left basilar opacity. Improving aeration at the right  base compared to the prior.  No displaced fracture.  IMPRESSION: Endotracheal tube remains in place, terminating suitably above the carina.  Persisting left basilar opacity, likely combination of pleural effusion, atelectasis, and/or consolidation.  Improving aeration at the right base.  Interval removal of gastric tube.  Signed,  Dulcy Fanny. Earleen Newport, DO  [facility]  [facility]   Electronically Signed   By: Corrie Mckusick O.D.   On: 09/19/2014 19:07   Portable Chest Xray In Am 09/20/2014   CLINICAL DATA:  Hypoxia  EXAM: PORTABLE CHEST - 1 VIEW  COMPARISON:  September 19, 2014  FINDINGS: Endotracheal tube tip is 4.6 cm above the carina. Central catheter tip is in the superior vena cava. No apparent pneumothorax. There is left effusion with left base atelectasis. Lungs elsewhere clear. Heart is upper normal  in size with pulmonary vascularity within normal limits. No adenopathy.  IMPRESSION: Tube and catheter positions as described without pneumothorax. Left effusion with left base atelectatic change. Right lung clear.   Electronically Signed   By: Lowella Grip M.D.   On: 09/20/2014 07:08   Dg Chest Port 1 View 09/19/2014   CLINICAL DATA:  Intubation.  EXAM: PORTABLE CHEST - 1 VIEW  COMPARISON:  09/19/2014.  09/16/2014.  FINDINGS: Endotracheal tube, NG tube, right PICC are in stable position. Small bilateral pulmonary edema and small pleural effusions. Stable cardiomegaly. Note pneumothorax. Old left clavicular fracture.  IMPRESSION: 1.  Lines and tubes in stable position.  2.  Mild bibasilar pulmonary edema and small pleural effusions.   Electronically Signed   By: Marcello Moores  Register   On: 09/19/2014 07:59     Current Medications:  . antiseptic oral rinse  7 mL Mouth Rinse QID  . budesonide  0.5 mg Nebulization BID  . carvedilol  3.125 mg Oral BID WC  . chlorhexidine  15 mL Mouth Rinse BID  . fentaNYL  200 mcg Intravenous Once  . free water  250 mL Per Tube Q6H  . furosemide  20 mg Oral Daily  . haloperidol lactate  5 mg Intravenous Once  . insulin aspart  0-15 Units Subcutaneous 6 times per day  . ipratropium-albuterol  3 mL Nebulization Q6H  . levETIRAcetam  500 mg Intravenous Q12H  . methylPREDNISolone (SOLU-MEDROL) injection  40 mg Intravenous Q12H  . niMODipine  60 mg Oral Q4H   Or  . NiMODipine  60 mg Per Tube Q4H  . pantoprazole (PROTONIX) IV  40 mg Intravenous QHS  . piperacillin-tazobactam (ZOSYN)  IV  3.375 g Intravenous Q8H  . vancomycin  750 mg Intravenous Q12H   . dextrose 10 mL/hr at 09/20/14 0118  . feeding supplement (VITAL AF 1.2 CAL) Stopped (09/19/14 1100)  . fentaNYL infusion INTRAVENOUS Stopped (09/20/14 0830)     Signed, Rosaria Ferries , PA-C 10:24 AM 09/20/2014  Pt. Seen & examined along with Ms. Ahmed Prima, PA-C.   Available data in the Epic Chart reviewed.     Re-intubated yesterday PM.  Tele with what appears to be ? Retrograde P waves from probably junctional foci -- BB d/c'd BP has remained more stable - may be able to consider afterload reduction with ACE/ARB, but wait until post Trach/Peg.  Still unsure of etiology for + Troponin -- ? True ACE NSTEMI vs. Demand Ischemia mediated myonecrosis.  At this point, would defer evaluation until she proves more stable.  Depending upon her clinical condition, consider Myoview prior to d/c vs. Re-look echo in ~3-4 weeks.  No  arrhythmias.  Leonie Man, M.D., M.S. Interventional Cardiologist   Pager # [phone]

## 2014-09-20 NOTE — Progress Notes (Signed)
Pt's son discussed plan for tracheostomy w/ Dr. Nelda Marseille & PEG w/ Dr. Grandville Silos. Consent obtained w/ RN witness.   Latrelle Dodrill

## 2014-09-20 NOTE — Progress Notes (Signed)
No gastric access for meds. D/w CCM & neurosurgery. Plan for PEG tomorrow per MD.

## 2014-09-20 NOTE — Consult Note (Signed)
Reason for Consult:Need for enteral access Referring Physician: Odena Mcquaid is an 63 y.o. female.  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 She underwent endovascular treatment of the aneurysm. Her stay is been complicated by a non-STEMI. She remains ventilator dependent. She failed a trial of extubation recently. CCM is proceeding with tracheostomy tomorrow. She needs enteral access and we were asked to consult for PEG placement.  Past Medical History  Diagnosis Date  . Hypertension   . Migraine   . Cancer     Cervicle  . Hepatitis C     Past Surgical History  Procedure Laterality Date  . Abdominal hysterectomy    . Radiology with anesthesia N/A 09/11/2014    Procedure: Sub Arachnoid Aneurysm Coiling -RADIOLOGY WITH ANESTHESIA ;  Surgeon: Consuella Lose, MD;  Location: [facility];  Service: Radiology;  Laterality: N/A;    No family history on file.  Social History:  reports that she has been smoking Cigarettes.  She has been smoking about 0.50 packs per day. She does not have any smokeless tobacco history on file. She reports that she drinks alcohol. She reports that she uses illicit drugs (Marijuana).  Allergies:  Allergies  Allergen Reactions  . Bee Venom Anaphylaxis and Hives    Medications:  Scheduled: . antiseptic oral rinse  7 mL Mouth Rinse QID  . budesonide  0.5 mg Nebulization BID  . carvedilol  3.125 mg Oral BID WC  . chlorhexidine  15 mL Mouth Rinse BID  . fentaNYL  200 mcg Intravenous Once  . free water  250 mL Per Tube Q6H  . furosemide  20 mg Oral Daily  . haloperidol lactate  5 mg Intravenous Once  . insulin aspart  0-15 Units Subcutaneous 6 times per day  . ipratropium-albuterol  3 mL Nebulization Q6H  . levETIRAcetam  500 mg Intravenous Q12H  . methylPREDNISolone (SOLU-MEDROL) injection  40 mg Intravenous Q12H  . niMODipine  60 mg Oral Q4H   Or  . NiMODipine  60 mg Per Tube Q4H  . pantoprazole  (PROTONIX) IV  40 mg Intravenous QHS  . piperacillin-tazobactam (ZOSYN)  IV  3.375 g Intravenous Q8H  . vancomycin  750 mg Intravenous Q12H   Continuous: . dextrose 10 mL/hr at 09/20/14 0118  . feeding supplement (VITAL AF 1.2 CAL) Stopped (09/19/14 1100)  . fentaNYL infusion INTRAVENOUS Stopped (09/20/14 0830)   MGN:[id number], albuterol, bisacodyl, fentaNYL, labetalol, morphine injection, sodium chloride  Results for orders placed during the hospital encounter of 09/11/14 (from the past 48 hour(s))  GLUCOSE, CAPILLARY     Status: Abnormal   Collection Time    09/18/14 11:58 AM      Result Value Ref Range   Glucose-Capillary 186 (*) 70 - 99 mg/dL   Comment 1 Notify RN     Comment 2 Documented in Chart    GLUCOSE, CAPILLARY     Status: Abnormal   Collection Time    09/18/14  3:58 PM      Result Value Ref Range   Glucose-Capillary 120 (*) 70 - 99 mg/dL   Comment 1 Notify RN     Comment 2 Documented in Chart    GLUCOSE, CAPILLARY     Status: Abnormal   Collection Time    09/18/14  9:12 PM      Result Value Ref Range   Glucose-Capillary 177 (*) 70 - 99 mg/dL  GLUCOSE, CAPILLARY     Status: Abnormal   Collection Time  09/18/14 11:25 PM      Result Value Ref Range   Glucose-Capillary 122 (*) 70 - 99 mg/dL   Comment 1 Documented in Chart     Comment 2 Notify RN    GLUCOSE, CAPILLARY     Status: Abnormal   Collection Time    09/19/14  3:33 AM      Result Value Ref Range   Glucose-Capillary 129 (*) 70 - 99 mg/dL   Comment 1 Documented in Chart     Comment 2 Notify RN    BLOOD GAS, ARTERIAL     Status: Abnormal   Collection Time    09/19/14  3:58 AM      Result Value Ref Range   FIO2 0.40     Delivery systems VENTILATOR     Mode PRESSURE REGULATED VOLUME CONTROL     VT 500     Rate 14     Peep/cpap 5.0     pH, Arterial 7.410  7.350 - 7.450   pCO2 arterial 38.3  35.0 - 45.0 mmHg   pO2, Arterial 152.0 (*) 80.0 - 100.0 mmHg   Bicarbonate 23.8  20.0 - 24.0 mEq/L    TCO2 25.0  0 - 100 mmol/L   Acid-base deficit 0.2  0.0 - 2.0 mmol/L   O2 Saturation 99.1     Patient temperature 98.6     Collection site RIGHT RADIAL     Drawn by [id number]     Sample type ARTERIAL     Allens test (pass/fail) PASS  PASS   Comment: Performed at [facility] PANEL     Status: Abnormal   Collection Time    09/19/14  6:13 AM      Result Value Ref Range   Sodium 152 (*) 137 - 147 mEq/L   Potassium 4.3  3.7 - 5.3 mEq/L   Chloride 116 (*) 96 - 112 mEq/L   CO2 25  19 - 32 mEq/L   Glucose, Bld 98  70 - 99 mg/dL   BUN 16  6 - 23 mg/dL   Creatinine, Ser 0.56  0.50 - 1.10 mg/dL   Calcium 8.1 (*) 8.4 - 10.5 mg/dL   GFR calc non Af Amer >90  >90 mL/min   GFR calc Af Amer >90  >90 mL/min   Comment: (NOTE)     The eGFR has been calculated using the CKD EPI equation.     This calculation has not been validated in all clinical situations.     eGFR's persistently <90 mL/min signify possible Chronic Kidney     Disease.   Anion gap 11  5 - 15  CBC     Status: Abnormal   Collection Time    09/19/14  6:13 AM      Result Value Ref Range   WBC 18.2 (*) 4.0 - 10.5 K/uL   RBC 3.78 (*) 3.87 - 5.11 MIL/uL   Hemoglobin 11.5 (*) 12.0 - 15.0 g/dL   HCT 37.5  36.0 - 46.0 %   MCV 99.2  78.0 - 100.0 fL   MCH 30.4  26.0 - 34.0 pg   MCHC 30.7  30.0 - 36.0 g/dL   RDW 15.2  11.5 - 15.5 %   Platelets 215  150 - 400 K/uL  MAGNESIUM     Status: None   Collection Time    09/19/14  6:13 AM      Result Value Ref Range   Magnesium 1.9  1.5 -  2.5 mg/dL  PHOSPHORUS     Status: None   Collection Time    09/19/14  6:13 AM      Result Value Ref Range   Phosphorus 3.3  2.3 - 4.6 mg/dL  GLUCOSE, CAPILLARY     Status: Abnormal   Collection Time    09/19/14  7:42 AM      Result Value Ref Range   Glucose-Capillary 138 (*) 70 - 99 mg/dL  GLUCOSE, CAPILLARY     Status: Abnormal   Collection Time    09/19/14 12:05 PM      Result Value Ref Range   Glucose-Capillary  161 (*) 70 - 99 mg/dL  GLUCOSE, CAPILLARY     Status: Abnormal   Collection Time    09/19/14  4:06 PM      Result Value Ref Range   Glucose-Capillary 106 (*) 70 - 99 mg/dL  BLOOD GAS, ARTERIAL     Status: Abnormal   Collection Time    09/19/14  6:00 PM      Result Value Ref Range   FIO2 0.40     Delivery systems VENTILATOR     Mode PRESSURE REGULATED VOLUME CONTROL     VT 500     Rate 14     Peep/cpap 5.0     pH, Arterial 7.383  7.350 - 7.450   pCO2 arterial 41.4  35.0 - 45.0 mmHg   pO2, Arterial 112.0 (*) 80.0 - 100.0 mmHg   Bicarbonate 24.1 (*) 20.0 - 24.0 mEq/L   TCO2 25.4  0 - 100 mmol/L   Acid-base deficit 0.3  0.0 - 2.0 mmol/L   O2 Saturation 97.8     Patient temperature 98.6     Collection site RIGHT RADIAL     Drawn by [id number]     Sample type ARTERIAL     Allens test (pass/fail) PASS  PASS  GLUCOSE, CAPILLARY     Status: Abnormal   Collection Time    09/19/14  8:15 PM      Result Value Ref Range   Glucose-Capillary 115 (*) 70 - 99 mg/dL   Comment 1 Notify RN    GLUCOSE, CAPILLARY     Status: Abnormal   Collection Time    09/19/14 11:42 PM      Result Value Ref Range   Glucose-Capillary 111 (*) 70 - 99 mg/dL   Comment 1 Documented in Chart     Comment 2 Notify RN    GLUCOSE, CAPILLARY     Status: Abnormal   Collection Time    09/20/14  3:27 AM      Result Value Ref Range   Glucose-Capillary 108 (*) 70 - 99 mg/dL   Comment 1 Documented in Chart     Comment 2 Notify RN    CBC     Status: Abnormal   Collection Time    09/20/14  5:40 AM      Result Value Ref Range   WBC 15.5 (*) 4.0 - 10.5 K/uL   RBC 3.82 (*) 3.87 - 5.11 MIL/uL   Hemoglobin 11.2 (*) 12.0 - 15.0 g/dL   HCT 36.4  36.0 - 46.0 %   MCV 95.3  78.0 - 100.0 fL   MCH 29.3  26.0 - 34.0 pg   MCHC 30.8  30.0 - 36.0 g/dL   RDW 14.5  11.5 - 15.5 %   Platelets 237  150 - 400 K/uL  BASIC METABOLIC PANEL     Status: Abnormal  Collection Time    09/20/14  5:40 AM      Result Value Ref Range   Sodium  147  137 - 147 mEq/L   Potassium 3.7  3.7 - 5.3 mEq/L   Chloride 110  96 - 112 mEq/L   CO2 27  19 - 32 mEq/L   Glucose, Bld 105 (*) 70 - 99 mg/dL   BUN 13  6 - 23 mg/dL   Creatinine, Ser 0.51  0.50 - 1.10 mg/dL   Calcium 8.1 (*) 8.4 - 10.5 mg/dL   GFR calc non Af Amer >90  >90 mL/min   GFR calc Af Amer >90  >90 mL/min   Comment: (NOTE)     The eGFR has been calculated using the CKD EPI equation.     This calculation has not been validated in all clinical situations.     eGFR's persistently <90 mL/min signify possible Chronic Kidney     Disease.   Anion gap 10  5 - 15  MAGNESIUM     Status: None   Collection Time    09/20/14  5:40 AM      Result Value Ref Range   Magnesium 1.8  1.5 - 2.5 mg/dL  PHOSPHORUS     Status: None   Collection Time    09/20/14  5:40 AM      Result Value Ref Range   Phosphorus 3.5  2.3 - 4.6 mg/dL  GLUCOSE, CAPILLARY     Status: Abnormal   Collection Time    09/20/14  8:16 AM      Result Value Ref Range   Glucose-Capillary 111 (*) 70 - 99 mg/dL   Comment 1 Notify RN     Comment 2 Documented in Chart      Ct Angio Head W/cm &/or Wo Cm  09/19/2014   CLINICAL DATA:  Vasospasm. Recent subarachnoid hemorrhage with coil embolization of right ACA aneurysm.  EXAM: CT ANGIOGRAPHY HEAD  TECHNIQUE: Multidetector CT imaging of the head was performed using the standard protocol during bolus administration of intravenous contrast. Multiplanar CT image reconstructions and MIPs were obtained to evaluate the vascular anatomy.  CONTRAST:  89m OMNIPAQUE IOHEXOL 350 MG/ML SOLN  COMPARISON:  Head CTA 09/11/2014. Cerebral angiogram 09/11/2014. Head CT 09/12/2014.  FINDINGS: Diffuse subarachnoid hemorrhage has decreased from the prior CT. Blood in the occipital horns of the lateral ventricles has also mildly decreased. Ventricles are stable to minimally decreased in size compared to the prior CT. There is no midline shift. No definite acute large territory infarct is identified.  Aneurysm coils are seen in the region the proximal ACAs. No abnormal enhancement is identified. Orbits are unremarkable. Endotracheal tube is partially visualized. Mastoid air cells and paranasal sinuses are clear.  Visualized distal vertebral arteries are patent and codominant. PICA origins are patent. A ICA and SCA origins are patent. Basilar artery is patent without stenosis. PCAs are unremarkable. Posterior communicating arteries are not clearly identified.  Internal carotid arteries are patent from skullbase to carotid termini. Mild narrowing of the distal features/proximal cavernous ICA on the left is unchanged. Mild atherosclerotic calcification is noted of the cavernous carotids bilaterally, with mild diffuse left cavernous carotid irregularity.  Evaluation of the proximal ACAs is partially limited by streak artifact from coils at the site of previously described anterior communicating artery aneurysm. There is moderate, diffuse narrowing of the mid and distal A1 segments bilaterally which appears more prominent than on the prior CTA, particularly on the right. No clear residual  aneurysm is identified at the site of coronal embolization, but evaluation is limited by artifact. A2 segments appear patent. There is moderate irregular narrowing of the proximal left A2 segment which is slightly more prominent than on the prior CTA. Mild to moderate irregular narrowing is present in both A2 segments more distally. The MCAs are unremarkable.  Review of the MIP images confirms the above findings.  IMPRESSION: 1. 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 2. No major intracranial arterial occlusion. Moderate narrowing of the mid to distal A1 segments and intermittent narrowing of both A2 segments appears mildly increased from the prior CTA and may reflect a degree of vasospasm.   Electronically Signed   By: Logan Bores   On:  09/19/2014 20:02   Dg Chest 1 View  09/19/2014   CLINICAL DATA:  63 year old female with intubation.  EXAM: CHEST - 1 VIEW  COMPARISON:  09/19/2014.  Multiple prior plain film  FINDINGS: Cardiomediastinal silhouette unchanged in size and contour. Atherosclerotic calcifications of the aortic arch.  Endotracheal tube remains in position, terminating approximately 5 cm above the carina. Right-sided subclavian central line appears to terminate in superior vena cava. Interval removal of gastric tube. Overlying EKG leads.  Persisting left basilar opacity. Improving aeration at the right base compared to the prior.  No displaced fracture.  IMPRESSION: Endotracheal tube remains in place, terminating suitably above the carina.  Persisting left basilar opacity, likely combination of pleural effusion, atelectasis, and/or consolidation.  Improving aeration at the right base.  Interval removal of gastric tube.  Signed,  Dulcy Fanny. Earleen Newport, DO  [facility]  [facility]   Electronically Signed   By: Corrie Mckusick O.D.   On: 09/19/2014 19:07   Portable Chest Xray In Am  09/20/2014   CLINICAL DATA:  Hypoxia  EXAM: PORTABLE CHEST - 1 VIEW  COMPARISON:  September 19, 2014  FINDINGS: Endotracheal tube tip is 4.6 cm above the carina. Central catheter tip is in the superior vena cava. No apparent pneumothorax. There is left effusion with left base atelectasis. Lungs elsewhere clear. Heart is upper normal in size with pulmonary vascularity within normal limits. No adenopathy.  IMPRESSION: Tube and catheter positions as described without pneumothorax. Left effusion with left base atelectatic change. Right lung clear.   Electronically Signed   By: Lowella Grip M.D.   On: 09/20/2014 07:08   Dg Chest Port 1 View  09/19/2014   CLINICAL DATA:  Intubation.  EXAM: PORTABLE CHEST - 1 VIEW  COMPARISON:  09/19/2014.  09/16/2014.  FINDINGS: Endotracheal tube, NG tube, right PICC are in stable  position. Small bilateral pulmonary edema and small pleural effusions. Stable cardiomegaly. Note pneumothorax. Old left clavicular fracture.  IMPRESSION: 1.  Lines and tubes in stable position.  2.  Mild bibasilar pulmonary edema and small pleural effusions.   Electronically Signed   By: Marcello Moores  Register   On: 09/19/2014 07:59    Review of Systems  Unable to perform ROS: intubated   Blood pressure 123/63, pulse 67, temperature 100.9 F (38.3 C), temperature source Axillary, resp. rate 12, height _0  (1.6 m), weight 127 lb 10.3 oz (57.9 kg), SpO2 97.00%. Physical Exam  Constitutional: She appears well-developed and well-nourished. No distress.  HENT:  Head: Normocephalic.  Endotracheal tube orally  Neck: Normal range of motion. Neck supple.  Cardiovascular: Normal rate and normal heart sounds.   Respiratory: Effort normal and breath sounds normal. No respiratory  distress. She has no wheezes. She has no rales.  GI: Soft. She exhibits no distension. There is no tenderness.  No significant scars in the upper midline  Neurological:  Awake on vent, follows commands to move extremities  Skin: Skin is warm.    Assessment/Plan: Status post subarachnoid hemorrhage with need for enteral access. We'll proceed with percutaneous endoscopic gastrostomy tube placement tomorrow. We'll obtain consent. I did speak with the patient about this as well and she seems to have some understanding.  Toron Bowring E 09/20/2014, 10:00 AM

## 2014-09-20 NOTE — Progress Notes (Signed)
PULMONARY / CRITICAL CARE MEDICINE   Name: Mindy Mills MRN: [id number] DOB: September 03, 1951    ADMISSION DATE:  09/11/2014 CONSULTATION DATE:  09/20/2014  REFERRING MD :  Kathyrn Sheriff  CHIEF COMPLAINT:  Seizure  INITIAL PRESENTATION: 63 y.o. F brought to [facility] ED on 9/15 after a witnessed syncopal episode followed by a grand mal seizure while in the car.  Upon ED arrival, pt was unresponsive and not moving her right side.  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  She was intubated and underwent A com aneurysm coiling by  Dr. Leighton Ruff.  PCCM was consulted.  She was reintubated 9/17 for pulmonary edema & hypoxia induced delerium PMH - hepc, smoker, chronic headaches on narcotics  STUDIES:  CTA Head 9/15 >>> large SAH, 2.5 x 1mm right ACA aneurysm as source of SAH. Echo 9/16 >>EF 25-30%, apical akinesis- LAD infarction pattern.Moderate diastolic dysfunction .  RVSP 50  SIGNIFICANT EVENTS: 9/15 - admitted with [facility] and right ACA aneurysm, underwent endovascular repair, returned to ICU on vent. 9/15 - A com aneurysm coiled 9/16 difficulty with panda 9/18 OGT placed by GI 9/21 VDRF, BiPAP overnight, required   SUBJECTIVE: Failed extubation overnight, reintubated.  VITAL SIGNS: Temp:  [99.4 F (37.4 C)-102.6 F (39.2 C)] 100.9 F (38.3 C) (09/24 0800) Pulse Rate:  [52-89] 67 (09/24 0900) Resp:  [8-23] 12 (09/24 0900) BP: (102-134)/(51-72) 123/63 mmHg (09/24 0900) SpO2:  [90 %-100 %] 97 % (09/24 0900) FiO2 (%):  [40 %] 40 % (09/24 0900) Weight:  [57.9 kg (127 lb 10.3 oz)] 57.9 kg (127 lb 10.3 oz) (09/24 0050)  HEMODYNAMICS: CVP:  [4 mmHg-6 mmHg] 6 mmHg  VENTILATOR SETTINGS: Vent Mode:  [-] CPAP;PSV FiO2 (%):  [40 %] 40 % Set Rate:  [14 bmp] 14 bmp Vt Set:  [500 mL] 500 mL PEEP:  [5 cmH20] 5 cmH20 Pressure Support:  [8 cmH20] 8 cmH20 Plateau Pressure:  [12 cmH20-14 cmH20] 13 cmH20  INTAKE / OUTPUT: Intake/Output     09/23 0701 - 09/24 0700 09/24 0701 - 09/25 0700   I.V.  (mL/kg) 313.8 (5.4) 25 (0.4)   Other     NG/GT 450    IV Piggyback 575    Total Intake(mL/kg) 1338.8 (23.1) 25 (0.4)   Urine (mL/kg/hr) 1350 (1) 60 (0.3)   Total Output 1350 60   Net -11.3 -35         PHYSICAL EXAMINATION: General: Chronically ill appearing elderly female,  Intubated but following commands. Neuro: Follows commands, alert and interactive HEENT: Albert/AT. PERRL, sclerae anicteric. Cardiovascular: RRR, no M/R/G.  Lungs: Coarse BS diffusely but clearing Abdomen: BS x 4, soft, NT/ND.  Musculoskeletal: No gross deformities, no edema.  Skin: Intact, warm, no rashes.  LABS:  CBC  Recent Labs Lab 09/18/14 0520 09/19/14 0613 09/20/14 0540  WBC 17.3* 18.2* 15.5*  HGB 11.4* 11.5* 11.2*  HCT 36.5 37.5 36.4  PLT 228 215 237   Coag's No results found for this basename: APTT, INR,  in the last 168 hours  BMET  Recent Labs Lab 09/18/14 0520 09/19/14 0613 09/20/14 0540  NA 153* 152* 147  K 3.3* 4.3 3.7  CL 118* 116* 110  CO2 25 25 27   BUN 18 16 13   CREATININE 0.56 0.56 0.51  GLUCOSE 125* 98 105*   Electrolytes  Recent Labs Lab 09/18/14 0520 09/19/14 0613 09/20/14 0540  CALCIUM 7.6* 8.1* 8.1*  MG 1.9 1.9 1.8  PHOS 2.6 3.3 3.5   Sepsis Markers No results  found for this basename: LATICACIDVEN, PROCALCITON, O2SATVEN,  in the last 168 hours  ABG  Recent Labs Lab 09/18/14 0503 09/19/14 0358 09/19/14 1800  PHART 7.435 7.410 7.383  PCO2ART 35.7 38.3 41.4  PO2ART 65.4* 152.0* 112.0*    Liver Enzymes No results found for this basename: AST, ALT, ALKPHOS, BILITOT, ALBUMIN,  in the last 168 hours Cardiac Enzymes No results found for this basename: TROPONINI, PROBNP,  in the last 168 hours Glucose  Recent Labs Lab 09/19/14 1205 09/19/14 1606 09/19/14 2015 09/19/14 2342 09/20/14 0327 09/20/14 0816  GLUCAP 161* 106* 115* 111* 108* 111*    Imaging CXR - BL ASD -decreased edema pattern  ASSESSMENT / PLAN:  PULMONARY OETT 9/15 >>>9/16,  9/17 >>9/19, 9/21>>> A: VDRF - in setting of 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 Aspiration pneumonia P:   Extubated then reintubated due to inability to protect airway, will plan on tracheostomy in AM. D/C solumedrol, this is an airway protection issue. PS for now and hopefully to come of the vent in AM post trach at 10:30.  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 Ct nimodipine Further recommendations per neuro surg. PRN sedation, appears comfortable.  CARDIOVASCULAR A:  Hx HTN Incomplete LBBB on EKG, nstemi Acute Pulmonary edema  NSTEMI - LAD pattern WMA on echo vs Takatsubo's P:  Cards consult appreciated Continue to hold lasix to avoid vasospasm.  RENAL A:   Hypokalemia Mild metabolic acidosis- resolved Hypernatremia   P:  Replace electrolytes as indicated. KVO IVF BMET in AM. Hold free water given no OGT, will likely replace post trach in AM for TF and free water.  GASTROINTESTINAL A:  Hx HCV Difficult panda placement P:   SUP: Pantoprazole. Keep off TF for today, will restart post trach in AM.  HEMATOLOGIC A:   VTE Prophylaxis P:  SCD's only.  INFECTIOUS A:  Aspiration pneumonia resp cult 9/17 >> P:   D/C ceftx/ clinda Vanc 9/21>>> Zosyn 9/21>>> Pan culture 9/21>>>NTD  ENDOCRINE A:   Hyperglycemia  P:   CBG's q4hr. SSI.  Summary - Failed extubation again, plan for trach in AM then PEG after.  I have personally obtained a history, examined the patient, evaluated laboratory and imaging results, formulated the assessment and plan and placed orders.  CRITICAL CARE: The patient is critically ill with multiple organ systems failure and requires high complexity decision making for assessment and support, frequent evaluation and titration of therapies,  application of advanced monitoring technologies and extensive interpretation of multiple databases. Critical Care Time devoted to patient care services described in this note is 35 minutes.   Rush Farmer, M.D. [facility] Pulmonary/Critical Care Medicine. Pager: [phone]. After hours pager: [phone].  09/20/2014  10:17 AM

## 2014-09-20 NOTE — Progress Notes (Signed)
UR completed.  Trach/PEG planned for patient.   Sandi Mariscal, RN BSN [place] CCM Trauma/Neuro ICU Case Manager [phone]

## 2014-09-20 NOTE — Progress Notes (Signed)
SLP Cancellation Note  Patient Details Name: Mindy Mills MRN: [id number] DOB: Jun 29, 1951   Cancelled treatment:       Reason Eval/Treat Not Completed: Medical issues which prohibited therapy   Elmo Shumard, Katherene Ponto 09/20/2014, 9:04 AM

## 2014-09-20 NOTE — Progress Notes (Signed)
Pt seen and examined. No issues overnight. Pt required re-intubation yesterday.  EXAM: Temp:  [99.4 F (37.4 C)-102.6 F (39.2 C)] 100.9 F (38.3 C) (09/24 0800) Pulse Rate:  [52-89] 65 (09/24 0800) Resp:  [8-23] 15 (09/24 0800) BP: (102-134)/(51-72) 118/53 mmHg (09/24 0800) SpO2:  [90 %-100 %] 97 % (09/24 0800) FiO2 (%):  [40 %] 40 % (09/24 0800) Weight:  [57.9 kg (127 lb 10.3 oz)] 57.9 kg (127 lb 10.3 oz) (09/24 0050) Intake/Output     09/23 0701 - 09/24 0700 09/24 0701 - 09/25 0700   I.V. (mL/kg) 313.8 (5.4) 15 (0.3)   Other     NG/GT 450    IV Piggyback 575    Total Intake(mL/kg) 1338.8 (23.1) 15 (0.3)   Urine (mL/kg/hr) 1350 (1) 60 (0.6)   Total Output 1350 60   Net -11.3 -45         Awake, alert Nods appropriately to questions Good strength BLE, appears symmetric Good strength LUE, appears slightly weaker RUE  LABS: Lab Results  Component Value Date   CREATININE 0.51 09/20/2014   BUN 13 09/20/2014   NA 147 09/20/2014   K 3.7 09/20/2014   CL 110 09/20/2014   CO2 27 09/20/2014   Lab Results  Component Value Date   WBC 15.5* 09/20/2014   HGB 11.2* 09/20/2014   HCT 36.4 09/20/2014   MCV 95.3 09/20/2014   PLT 237 09/20/2014    IMAGING: CTA yesterday demonstrates some narrowing of right A1 around coil mass, may represent some spasm vs artifact. No significant spasm of either MCA or posterior circulation  IMPRESSION: - 63 y.o. female SAHd# 10 s/p Acom coiling, neurologically stable - Will require trach/PEG given inability to protect airway  PLAN: - Cont close observation. - Plan on trach/PEG tomorrow - Net I/O overnight +2L. Will keep IVF at [facility] for now - Hypernatremia has resolved, can stop free H2O.

## 2014-09-20 NOTE — Progress Notes (Signed)
Pt currently intubated and on vent. bipap on S/B

## 2014-09-21 ENCOUNTER — Inpatient Hospital Stay (HOSPITAL_COMMUNITY): Payer: Medicare Other

## 2014-09-21 ENCOUNTER — Encounter (HOSPITAL_COMMUNITY)
Admission: EM | Disposition: A | Discharge status: Rehabilitation Facility | Payer: Self-pay | Source: Home / Self Care | Attending: Neurosurgery

## 2014-09-21 DIAGNOSIS — I609 Nontraumatic subarachnoid hemorrhage, unspecified: Principal | ICD-10-CM

## 2014-09-21 HISTORY — PX: PEG PLACEMENT: SHX5437

## 2014-09-21 LAB — GLUCOSE, CAPILLARY
GLUCOSE-CAPILLARY: 107 mg/dL — AB (ref 70–99)
GLUCOSE-CAPILLARY: 124 mg/dL — AB (ref 70–99)
GLUCOSE-CAPILLARY: 129 mg/dL — AB (ref 70–99)
GLUCOSE-CAPILLARY: 143 mg/dL — AB (ref 70–99)
Glucose-Capillary: 133 mg/dL — ABNORMAL HIGH (ref 70–99)
Glucose-Capillary: 89 mg/dL (ref 70–99)
Glucose-Capillary: 120 mg/dL — ABNORMAL HIGH (ref 70–99)

## 2014-09-21 LAB — BLOOD GAS, ARTERIAL
Acid-Base Excess: 2.5 mmol/L — ABNORMAL HIGH (ref 0.0–2.0)
Bicarbonate: 26.2 mEq/L — ABNORMAL HIGH (ref 20.0–24.0)
Drawn by: 31101
FIO2: 0.4 %
MECHVT: 500 mL
O2 SAT: 96.9 %
PATIENT TEMPERATURE: 98.6
PEEP: 5 cmH2O
RATE: 14 resp/min
TCO2: 27.4 mmol/L (ref 0–100)
pCO2 arterial: 38.8 mmHg (ref 35.0–45.0)
pH, Arterial: 7.445 (ref 7.350–7.450)
pO2, Arterial: 91 mmHg (ref 80.0–100.0)

## 2014-09-21 LAB — BASIC METABOLIC PANEL
Anion gap: 10 (ref 5–15)
BUN: 13 mg/dL (ref 6–23)
CO2: 29 mEq/L (ref 19–32)
Calcium: 8.3 mg/dL — ABNORMAL LOW (ref 8.4–10.5)
Chloride: 107 mEq/L (ref 96–112)
Creatinine, Ser: 0.5 mg/dL (ref 0.50–1.10)
GFR calc Af Amer: >90 mL/min (ref >90)
GFR calc non Af Amer: >90 mL/min (ref >90)
GLUCOSE: 97 mg/dL (ref 70–99)
POTASSIUM: 3.8 meq/L (ref 3.7–5.3)
SODIUM: 146 meq/L (ref 137–147)

## 2014-09-21 LAB — CBC
HEMATOCRIT: 34.6 % — AB (ref 36.0–46.0)
Hemoglobin: 11.2 g/dL — ABNORMAL LOW (ref 12.0–15.0)
MCH: 30.3 pg (ref 26.0–34.0)
MCHC: 32.4 g/dL (ref 30.0–36.0)
MCV: 93.5 fL (ref 78.0–100.0)
Platelets: 249 10*3/uL (ref 150–400)
RBC: 3.7 MIL/uL — AB (ref 3.87–5.11)
RDW: 13.6 % (ref 11.5–15.5)
WBC: 15.1 10*3/uL — ABNORMAL HIGH (ref 4.0–10.5)

## 2014-09-21 LAB — PROTIME-INR
INR: 1.12 (ref 0.00–1.49)
Prothrombin Time: 14.4 seconds (ref 11.6–15.2)

## 2014-09-21 LAB — APTT: aPTT: 27 seconds (ref 24–37)

## 2014-09-21 LAB — MAGNESIUM: MAGNESIUM: 1.9 mg/dL (ref 1.5–2.5)

## 2014-09-21 LAB — PHOSPHORUS: PHOSPHORUS: 3.1 mg/dL (ref 2.3–4.6)

## 2014-09-21 SURGERY — INSERTION, PEG TUBE
Anesthesia: Choice

## 2014-09-21 SURGERY — INSERTION, PEG TUBE

## 2014-09-21 MED ORDER — MAGNESIUM SULFATE 40 MG/ML IJ SOLN
2.0000 g | Freq: Once | INTRAMUSCULAR | Status: AC
  Administered 2014-09-21: 2 g via INTRAVENOUS
  Filled 2014-09-21: qty 50

## 2014-09-21 MED ORDER — FENTANYL CITRATE 0.05 MG/ML IJ SOLN
50.0000 ug | INTRAMUSCULAR | Status: DC | PRN
  Administered 2014-09-21 – 2014-09-24 (×14): 50 ug via INTRAVENOUS
  Filled 2014-09-21 (×14): qty 2

## 2014-09-21 NOTE — Procedures (Signed)
Percutaneous Tracheostomy Placement  Consent from family.  Patient sedated, paralyzed and positioned.  Area cleaned, lidocaine/epi injected, skin incision done followed by blunt dissection, airway entered and wire passed and visualized bronchoscopically, airway crushed and dilated.  Size 6 cuffed shiley trach was placed without difficulty.  Minimal blood loss.  Patient tolerated the procedure well.  Post trach orderset placed.  Rush Farmer, M.D. [facility] Pulmonary/Critical Care Medicine. Pager: [phone]. After hours pager: [phone].

## 2014-09-21 NOTE — Progress Notes (Signed)
NUTRITION FOLLOW-UP  INTERVENTION: Once PEG ready for use re-start Vital AF 1.2 @ 50 ml/hr via PEG.   Tube feeding regimen provides 1440 kcal (100% of needs), 90 grams of protein, and 973 ml of H2O.   NUTRITION DIAGNOSIS: Inadequate oral intake related to inability to eat as evidenced by NPO status; ongoing.   Goal: Pt to meet >/= 90% of their estimated nutrition needs; currently not met.    Monitor:  Respiratory status, TF tolerance, labs  ASSESSMENT: Pt admitted after witnessed grand mal seizure. 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 Pt self extubated 9/16 but re-intubated 9/17. Pt extubated 9/20 but re-intubated 9/21.   Patient is currently intubated on ventilator support MV: 7.5 L/min Temp (24hrs), Avg:99 F (37.2 C), Min:98.6 F (37 C), Max:100.4 F (38 C)  Pt extubated 9/23 but was re-intubated same day. TF held in anticipation of extubation on 9/23 and not restarted with planned trach/PEG.  Pt is now s/p trach and PEG 9/25.   Height: Ht Readings from Last 1 Encounters:  09/13/14 $RemoveB'5\' 3"'[id number]$  (1.6 m)    Weight: Wt Readings from Last 1 Encounters:  09/21/14 127 lb 3.3 oz (57.7 kg)    BMI:  Body mass index is 22.54 kg/(m^2).  Estimated Nutritional Needs: Kcal: 1400 Protein: 85-100 grams Fluid: > 1.5 L/day  Skin: ecchymosis  Diet Order: NPO   Intake/Output Summary (Last 24 hours) at 09/21/14 1411 Last data filed at 09/21/14 0700  Gross per 24 hour  Intake    540 ml  Output   1250 ml  Net   -710 ml    Last BM: 9/23  Labs:   Recent Labs Lab 09/19/14 0613 09/20/14 0540 09/21/14 0508  NA 152* 147 146  K 4.3 3.7 3.8  CL 116* 110 107  CO2 $Re'25 27 29  'JFx$ BUN $R'16 13 13  'nj$ CREATININE 0.56 0.51 0.50  CALCIUM 8.1* 8.1* 8.3*  MG 1.9 1.8 1.9  PHOS 3.3 3.5 3.1  GLUCOSE 98 105* 97    CBG (last 3)   Recent Labs  09/21/14 0422 09/21/14 0808 09/21/14 1152  GLUCAP 107* 89 143*    Scheduled Meds: . antiseptic oral  rinse  7 mL Mouth Rinse QID  . budesonide  0.5 mg Nebulization BID  . chlorhexidine  15 mL Mouth Rinse BID  . etomidate  40 mg Intravenous Once  . fentaNYL  200 mcg Intravenous Once  . fentaNYL  200 mcg Intravenous Once  . free water  250 mL Per Tube Q6H  . furosemide  20 mg Oral Daily  . haloperidol lactate  5 mg Intravenous Once  . insulin aspart  0-15 Units Subcutaneous 6 times per day  . ipratropium-albuterol  3 mL Nebulization Q6H  . levETIRAcetam  500 mg Intravenous Q12H  . magnesium sulfate 1 - 4 g bolus IVPB  2 g Intravenous Once  . methylPREDNISolone (SOLU-MEDROL) injection  40 mg Intravenous Q12H  . midazolam  4 mg Intravenous Once  . niMODipine  60 mg Oral Q4H   Or  . NiMODipine  60 mg Per Tube Q4H  . pantoprazole (PROTONIX) IV  40 mg Intravenous QHS  . piperacillin-tazobactam (ZOSYN)  IV  3.375 g Intravenous Q8H  . propofol  5-70 mcg/kg/min Intravenous Once  . vancomycin  750 mg Intravenous Q12H    Continuous Infusions: . dextrose 10 mL/hr at 09/20/14 0118  . feeding supplement (VITAL AF 1.2 CAL) Stopped (09/19/14 1100)  . fentaNYL  infusion INTRAVENOUS Stopped (09/21/14 1350)    [place], [place], [place] Pager [phone] After Hours Pager

## 2014-09-21 NOTE — Progress Notes (Signed)
Pt seen and examined. No issues overnight. Pt just had bedside trach  EXAM: Temp:  [98.6 F (37 C)-100.5 F (38.1 C)] 98.8 F (37.1 C) (09/25 0800) Pulse Rate:  [42-105] 65 (09/25 1119) Resp:  [12-20] 14 (09/25 1119) BP: (99-134)/(53-66) 112/63 mmHg (09/25 1119) SpO2:  [89 %-100 %] 100 % (09/25 1119) FiO2 (%):  [40 %] 40 % (09/25 1119) Weight:  [57.7 kg (127 lb 3.3 oz)] 57.7 kg (127 lb 3.3 oz) (09/25 0428) Intake/Output     09/24 0701 - 09/25 0700 09/25 0701 - 09/26 0700   I.V. (mL/kg) 305 (5.3)    NG/GT     IV Piggyback 625    Total Intake(mL/kg) 930 (16.1)    Urine (mL/kg/hr) 1635 (1.2)    Total Output 1635     Net -705           Pt sedated from trach  Per RN, pt was awake, alert, FC prior to trach. Slight RUE weakness compared to LUE, stable from yesterday  LABS: Lab Results  Component Value Date   CREATININE 0.50 09/21/2014   BUN 13 09/21/2014   NA 146 09/21/2014   K 3.8 09/21/2014   CL 107 09/21/2014   CO2 29 09/21/2014   Lab Results  Component Value Date   WBC 15.1* 09/21/2014   HGB 11.2* 09/21/2014   HCT 34.6* 09/21/2014   MCV 93.5 09/21/2014   PLT 249 09/21/2014   IMPRESSION: - 63 y.o. female SAHd# 11 s/p Acom coiling, neurologically stable - VDRF - Stress cardiomyopathy  PLAN: - Cont observation in ICU for entire vasospasm period (through [facility] 14) - Trach done at bedside. Plan on PEG this pm - Cont to maintain normotension and euvolemia.

## 2014-09-21 NOTE — Procedures (Signed)
Bedside Tracheostomy Insertion Procedure Note   Patient Details:   Name: Mindy Mills DOB: 1951-05-17 MRN: [id number]  Procedure: Tracheostomy  Pre Procedure Assessment: ET Tube Size:7.5 ET Tube secured at lip (cm):23 Bite block in place: No Breath Sounds: Clear  Post Procedure Assessment: BP 112/63  Pulse 65  Temp(Src) 98.8 F (37.1 C) (Axillary)  Resp 14  Ht 5\' 3"  (1.6 m)  Wt 127 lb 3.3 oz (57.7 kg)  BMI 22.54 kg/m2  SpO2 100% O2 sats: stable throughout Complications: No apparent complications Patient did tolerate procedure well Tracheostomy Brand:Shiley Tracheostomy Style:Cuffed Tracheostomy Size: 6.0 Tracheostomy Secured YSA:[id number], velcro Tracheostomy Placement Confirmation:Trach cuff visualized and in place, cxr taken for placement    Lorianna Spadaccini Ann 09/21/2014, 11:34 AM

## 2014-09-21 NOTE — Progress Notes (Signed)
PULMONARY / CRITICAL CARE MEDICINE   Name: Mindy Mills MRN: [id number] DOB: 06-28-1951    ADMISSION DATE:  09/11/2014 CONSULTATION DATE:  09/21/2014  REFERRING MD :  Kathyrn Sheriff  CHIEF COMPLAINT:  Seizure  INITIAL PRESENTATION: 63 y.o. F brought to [facility] ED on 9/15 after a witnessed syncopal episode followed by a grand mal seizure while in the car.  Upon ED arrival, pt was unresponsive and not moving her right side.  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  She was intubated and underwent A com aneurysm coiling by  Dr. Leighton Ruff.  PCCM was consulted.  She was reintubated 9/17 for pulmonary edema & hypoxia induced delerium PMH - hepc, smoker, chronic headaches on narcotics  STUDIES:  CTA Head 9/15 >>> large SAH, 2.5 x 68mm right ACA aneurysm as source of SAH. Echo 9/16 >>EF 25-30%, apical akinesis- LAD infarction pattern.Moderate diastolic dysfunction .  RVSP 50  SIGNIFICANT EVENTS: 9/15 - admitted with [facility] and right ACA aneurysm, underwent endovascular repair, returned to ICU on vent. 9/15 - A com aneurysm coiled 9/16 difficulty with panda 9/18 OGT placed by GI 9/21 VDRF, BiPAP overnight, required  9/23 extubated and reintubated 9/25 trach (JY)  SUBJECTIVE: Failed extubation overnight, reintubated, plan to trach today.  VITAL SIGNS: Temp:  [98.6 F (37 C)-100.5 F (38.1 C)] 98.8 F (37.1 C) (09/25 0800) Pulse Rate:  [42-105] 50 (09/25 0900) Resp:  [12-20] 16 (09/25 0900) BP: (99-134)/(53-66) 114/60 mmHg (09/25 0900) SpO2:  [89 %-100 %] 97 % (09/25 0900) FiO2 (%):  [40 %] 40 % (09/25 0719) Weight:  [57.7 kg (127 lb 3.3 oz)] 57.7 kg (127 lb 3.3 oz) (09/25 0428)  HEMODYNAMICS: CVP:  [0 mmHg-9 mmHg] 3 mmHg  VENTILATOR SETTINGS: Vent Mode:  [-] PRVC FiO2 (%):  [40 %] 40 % Set Rate:  [14 bmp] 14 bmp Vt Set:  [500 mL] 500 mL PEEP:  [5 cmH20] 5 cmH20 Pressure Support:  [8 cmH20] 8 cmH20 Plateau Pressure:  [12 cmH20-24 cmH20] 14 cmH20  INTAKE /  OUTPUT: Intake/Output     09/24 0701 - 09/25 0700 09/25 0701 - 09/26 0700   I.V. (mL/kg) 305 (5.3)    NG/GT     IV Piggyback 625    Total Intake(mL/kg) 930 (16.1)    Urine (mL/kg/hr) 1635 (1.2)    Total Output 1635     Net -705           PHYSICAL EXAMINATION: General: Chronically ill appearing elderly female,  Intubated but following commands. Neuro: Follows commands, alert and interactive HEENT: [place]/AT. PERRL, sclerae anicteric. Cardiovascular: RRR, no M/R/G.  Lungs: Coarse BS diffusely but clearing Abdomen: BS x 4, soft, NT/ND.  Musculoskeletal: No gross deformities, no edema.  Skin: Intact, warm, no rashes.  LABS:  CBC  Recent Labs Lab 09/19/14 0613 09/20/14 0540 09/21/14 0508  WBC 18.2* 15.5* 15.1*  HGB 11.5* 11.2* 11.2*  HCT 37.5 36.4 34.6*  PLT 215 237 249   Coag's  Recent Labs Lab 09/21/14 0508  APTT 27  INR 1.12   BMET  Recent Labs Lab 09/19/14 0613 09/20/14 0540 09/21/14 0508  NA 152* 147 146  K 4.3 3.7 3.8  CL 116* 110 107  CO2 25 27 29   BUN 16 13 13   CREATININE 0.56 0.51 0.50  GLUCOSE 98 105* 97   Electrolytes  Recent Labs Lab 09/19/14 0613 09/20/14 0540 09/21/14 0508  CALCIUM 8.1* 8.1* 8.3*  MG 1.9 1.8 1.9  PHOS 3.3 3.5 3.1  Sepsis Markers No results found for this basename: LATICACIDVEN, PROCALCITON, O2SATVEN,  in the last 168 hours  ABG  Recent Labs Lab 09/19/14 0358 09/19/14 1800 09/21/14 0254  PHART 7.410 7.383 7.445  PCO2ART 38.3 41.4 38.8  PO2ART 152.0* 112.0* 91.0   Liver Enzymes No results found for this basename: AST, ALT, ALKPHOS, BILITOT, ALBUMIN,  in the last 168 hours  Cardiac Enzymes No results found for this basename: TROPONINI, PROBNP,  in the last 168 hours  Glucose  Recent Labs Lab 09/20/14 1323 09/20/14 1609 09/20/14 2011 09/21/14 0015 09/21/14 0422 09/21/14 0808  GLUCAP 146* 102* 117* 133* 107* 89    Imaging CXR - BL ASD -decreased edema pattern  ASSESSMENT /  PLAN:  PULMONARY OETT 9/15 >>>9/16, 9/17 >>9/19, 9/21>>> A: VDRF - in setting of 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 Aspiration pneumonia P:   Hold weaning for now, trach today then proceed to TC quickly. D/Ced solumedrol, this is an airway protection issue. TC post trach once paralytics are off.  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 Ct nimodipine Further recommendations per neuro surg. PRN sedation until post trach.  CARDIOVASCULAR A:  Hx HTN Incomplete LBBB on EKG, nstemi Acute Pulmonary edema  NSTEMI - LAD pattern WMA on echo vs Takatsubo's P:  Cards consult appreciated. Continue to hold lasix to avoid vasospasm.  RENAL A:   Hypokalemia Mild metabolic acidosis- resolved Hypernatremia   P:  Replace electrolytes as indicated. KVO IVF BMET in AM.  GASTROINTESTINAL A:  Hx HCV Difficult panda placement P:   SUP: Pantoprazole. Restart TF post PEG placement today when OK with surgery.  HEMATOLOGIC A:   VTE Prophylaxis P:  SCD's only.  INFECTIOUS A:  Aspiration pneumonia resp cult 9/17 >> P:   D/Ced ceftx/ clinda Vanc 9/21>>> Zosyn 9/21>>> Pan culture 9/21>>>NTD  ENDOCRINE A:   Hyperglycemia  P:   CBG's q4hr. SSI.  Summary - Trach and PEG placement today, consult social work for LTAC placement once ok with NS.  Proceed with weaning to TC quickly post trach.  I have personally obtained a history, examined the patient, evaluated laboratory and imaging results, formulated the assessment and plan and placed orders.  CRITICAL CARE: The patient is critically ill with multiple organ systems failure and requires high complexity decision making for assessment and support, frequent evaluation and titration of therapies, application of advanced monitoring technologies  and extensive interpretation of multiple databases. Critical Care Time devoted to patient care services described in this note is 35 minutes.   Rush Farmer, M.D. [facility] Pulmonary/Critical Care Medicine. Pager: [phone]. After hours pager: [phone].  09/21/2014  9:43 AM

## 2014-09-21 NOTE — Progress Notes (Signed)
SLP Cancellation Note  Patient Details Name: [name] MRN: [id number] DOB: January 16, 1951   Cancelled treatment:       Reason Eval/Treat Not Completed: Medical issues which prohibited therapy.  Will sign off, await new orders.    Rey Dansby, Katherene Ponto 09/21/2014, 7:22 AM

## 2014-09-21 NOTE — Op Note (Signed)
09/11/2014  1:28 PM  PATIENT:  Mindy Mills  63 y.o. female  PRE-OPERATIVE DIAGNOSIS:  Status post CVA with need for enteral access  POST-OPERATIVE DIAGNOSIS:  Status post CVA with need for enteral access  PROCEDURE:  Procedure(s): Percutaneous endoscopic gastrostomy tube placement  SURGEON:  Georganna Skeans, M.D.  ASSISTANTS: Coralie Keens, PAC   ANESTHESIA:   local and IV sedation  EBL:     BLOOD ADMINISTERED:none  DRAINS: none   SPECIMEN:  No Specimen  DISPOSITION OF SPECIMEN:  N/A  COUNTS:  YES  DICTATION: .Dragon Dictation patient was identified in the neurotrauma intensive care unit. She remained monitored throughout. She received intravenous sedation, pain medication, and muscle relaxation. Time out procedure was performed. Esophagogastroduodenoscope was inserted via her mouth into her esophagus. There were no gross lesions. Scope was advanced under her stomach. The stomach was insufflated. First portion the duodenum was entered and there were no ulcers or lesions. The scope was withdrawn into the stomach and it was further insufflated. An excellent poke site was noted. This area was prepped and draped in sterile fashion. Local anesthetic was injected. Angiocath was inserted under direct vision followed by a guidewire which was grasped with the endoscopic snare without difficulty. Guidewire was brought out through the mouth and the PEG tube was attached. PEG tube was pulled out through the abdominal wall in standard fashion under direct endoscopic visualization. The flange was attached and appropriate snugness was achieved. Picture was taken. Stomach was evacuated. It was placed to gravity. Sterile dressing and antibiotic ointment were applied.OK to use for meds today, tube feeds tomorrow. We will place an abdominal binder as well. This completed the procedure and she tolerated without any complications.  PATIENT DISPOSITION:  ICU - intubated and hemodynamically stable.   Delay  start of Pharmacological VTE agent (>24hrs) due to surgical blood loss or risk of bleeding:  not applicable  Georganna Skeans, MD, MPH, FACS Pager: [phone]  9/25/20151:28 PM

## 2014-09-21 NOTE — Progress Notes (Signed)
PT Cancellation Note  Patient Details Name: Mindy Mills MRN: [id number] DOB: 11-Sep-1951   Cancelled Treatment:    Reason Eval/Treat Not Completed: Medical issues which prohibited therapy; undergoing PEG and trach today.  RN reports likely ready for OOB tomorrow.  Will check then.   WYNN,CYNDI 09/21/2014, 12:04 PM

## 2014-09-21 NOTE — Progress Notes (Signed)
[place] for Vancomycin, Zosyn Indication: pneumonia  Allergies  Allergen Reactions  . Bee Venom Anaphylaxis and Hives   Assessment: 63 year old female [place] now s/p Acom coiling Extubated 9/20 Now with progressive dyspnea requiring re-intubation today 9/21 possibly secondary to aspiration pneumonia  Trach and PEG planned for 9/25 / today Cultures negative   Goal of Therapy:  Vancomycin trough level 15-20 mcg/ml Appropriate Zosyn dosing  Plan:  1) Continue Zosyn 3.375 grams iv Q 8 hours 2) Continue Vancomycin 750 mg iv Q 12 hours - consider dc? 3) Continue to follow progress, fever trend, cultures      Patient Measurements: Height: 5\' 3"  (160 cm) Weight: 127 lb 3.3 oz (57.7 kg) IBW/kg (Calculated) : 52.4  Labs:  Recent Labs  09/19/14 0613 09/20/14 0540 09/21/14 0508  WBC 18.2* 15.5* 15.1*  HGB 11.5* 11.2* 11.2*  PLT 215 237 249  CREATININE 0.56 0.51 0.50    Thank you. Anette Guarneri, PharmD [phone]  09/21/2014,8:56 AM

## 2014-09-21 NOTE — Progress Notes (Signed)
MD spoke with pt's family about LTACH referral.  I continued the explanation and presented choices to them.  They chose [facility].  I have notified them of the referral and MD's preference of Monday for transfer day in order to allow other consulting MD's to consent as well.  Family is aware of the potential timing of the transfer.  Also spoke with them about the possibilities for care after LTACH, CIR vs SNF.

## 2014-09-21 NOTE — Progress Notes (Signed)
VASCULAR LAB PRELIMINARY  PRELIMINARY  PRELIMINARY  PRELIMINARY  Transcranial Doppler  Date POD PCO2 HCT BP  MCA ACA PCA OPHT SIPH VERT Basilar  9-16- 15 SB     Right  Left   42  27   -35  -31   28  24   15  13    36  45   -18  --     -31    9-18- 15 SB     Right  Left   62  39   --  -41   --  --   12  12   38  39   --  -29     --    9/21/ 15 MS  42.2 40.6 113/62 Right  Left   54  66   -100  -25   42  44   19  28   47  36   -22  -28     -28     09/19/14 MS  38.3 37.5 128/65 Right  Left   55  42   -35  -37   37  34   34  21   *  26   -21  -38   -31      9-25- 15 SB      Right  Left   72  37   --  -25   --  --   17  8   34  44   --  -20     -47         Right  Left                                            Right  Left                                        MCA = Middle Cerebral Artery      OPHT = Opthalmic Artery     BASILAR = Basilar Artery   ACA = Anterior Cerebral Artery     SIPH = Carotid Siphon PCA = Posterior Cerebral Artery   VERT = Verterbral Artery                   Normal MCA = 62+\-12 ACA = 50+\-12 PCA = 42+\-23   *Unable to insonate    09/21/2014 1:56 PM Michelle Simonetti, RVT, RDCS, RDMS

## 2014-09-21 NOTE — Procedures (Signed)
Procedure done by P Babcock ACNP-BC, under direct supervision of Dr Lake Bells. At first bronch was introduce through ET tube and structures of tracheal rings, carina identified for operator of tracheostomy who was Dr Nelda Marseille. Light of bronch passed through trachea and skin for indentification of tracheal rings for tracheostomy puncture. After this, under bronchoscopy guidance,  ET tube was pulled back sufficiently and very carefully. The ET tube was  pulled back enough to give room for tracheostomy operator and yet at same time to to ensure a secured airway. After this was accomplished, bronchoscope was withdrawn into the ET tube. After this,  Dr Nelda Marseille then performed tracheostomy under video visual provided by flexible video bronchoscopy. Followng introduction of tracheostomy,  the bronchoscope was removed from ET tube and introduced through tracheostomy. Correct position of tracheostomy was ensured, with enough room between carina and distal tracheostomy and no evidence of bleeding. The bronchoscope was then withdrawn. Respiratory therapist was then instructed to remove the ET tube.  Dr Nelda Marseille then proceeded to complete the tracheostomy with stay sutures   No complications   I supervised the procedure  Roselie Awkward, MD [place] PCCM Pager: [phone] Cell: [phone] If no response, call [phone]

## 2014-09-22 DIAGNOSIS — Z930 Tracheostomy status: Secondary | ICD-10-CM

## 2014-09-22 LAB — CBC
HCT: 37.2 % (ref 36.0–46.0)
HEMOGLOBIN: 11.7 g/dL — AB (ref 12.0–15.0)
MCH: 30.1 pg (ref 26.0–34.0)
MCHC: 31.5 g/dL (ref 30.0–36.0)
MCV: 95.6 fL (ref 78.0–100.0)
Platelets: 282 10*3/uL (ref 150–400)
RBC: 3.89 MIL/uL (ref 3.87–5.11)
RDW: 13.9 % (ref 11.5–15.5)
WBC: 18.2 10*3/uL — ABNORMAL HIGH (ref 4.0–10.5)

## 2014-09-22 LAB — BASIC METABOLIC PANEL
Anion gap: 12 (ref 5–15)
BUN: 14 mg/dL (ref 6–23)
CALCIUM: 8.2 mg/dL — AB (ref 8.4–10.5)
CO2: 28 mEq/L (ref 19–32)
Chloride: 107 mEq/L (ref 96–112)
Creatinine, Ser: 0.49 mg/dL — ABNORMAL LOW (ref 0.50–1.10)
GFR calc Af Amer: >90 mL/min (ref >90)
GFR calc non Af Amer: >90 mL/min (ref >90)
Glucose, Bld: 103 mg/dL — ABNORMAL HIGH (ref 70–99)
Potassium: 3.1 mEq/L — ABNORMAL LOW (ref 3.7–5.3)
SODIUM: 147 meq/L (ref 137–147)

## 2014-09-22 LAB — GLUCOSE, CAPILLARY
GLUCOSE-CAPILLARY: 114 mg/dL — AB (ref 70–99)
GLUCOSE-CAPILLARY: 134 mg/dL — AB (ref 70–99)
GLUCOSE-CAPILLARY: 96 mg/dL (ref 70–99)
Glucose-Capillary: 107 mg/dL — ABNORMAL HIGH (ref 70–99)
Glucose-Capillary: 99 mg/dL (ref 70–99)
Glucose-Capillary: 118 mg/dL — ABNORMAL HIGH (ref 70–99)

## 2014-09-22 LAB — PHOSPHORUS: PHOSPHORUS: 3.3 mg/dL (ref 2.3–4.6)

## 2014-09-22 LAB — MAGNESIUM: MAGNESIUM: 2.1 mg/dL (ref 1.5–2.5)

## 2014-09-22 MED ORDER — LOPERAMIDE HCL 1 MG/5ML PO LIQD
4.0000 mg | ORAL | Status: DC | PRN
  Administered 2014-09-22 – 2014-09-23 (×2): 4 mg
  Filled 2014-09-22 (×3): qty 20

## 2014-09-22 MED ORDER — ACETAMINOPHEN 160 MG/5ML PO SOLN
650.0000 mg | ORAL | Status: DC | PRN
  Administered 2014-09-22 – 2014-09-23 (×2): 650 mg
  Filled 2014-09-22 (×2): qty 20.3

## 2014-09-22 MED ORDER — HALOPERIDOL LACTATE 5 MG/ML IJ SOLN
2.0000 mg | Freq: Four times a day (QID) | INTRAMUSCULAR | Status: DC | PRN
  Administered 2014-09-22 (×2): 2 mg via INTRAVENOUS
  Administered 2014-09-23: 09:00:00 via INTRAVENOUS
  Administered 2014-09-23 – 2014-09-24 (×2): 2 mg via INTRAVENOUS
  Filled 2014-09-22 (×4): qty 1

## 2014-09-22 MED ORDER — POTASSIUM CHLORIDE 20 MEQ/15ML (10%) PO LIQD
40.0000 meq | Freq: Three times a day (TID) | ORAL | Status: AC
  Administered 2014-09-22 (×2): 40 meq
  Filled 2014-09-22 (×3): qty 30

## 2014-09-22 NOTE — Progress Notes (Signed)
Subjective: Patient reports On trach collar appears agitated.  Objective: Vital signs in last 24 hours: Temp:  [98.2 F (36.8 C)-100.2 F (37.9 C)] 98.4 F (36.9 C) (09/26 0800) Pulse Rate:  [50-89] 89 (09/26 0852) Resp:  [11-28] 19 (09/26 0852) BP: (90-129)/(46-69) 116/68 mmHg (09/26 0852) SpO2:  [92 %-100 %] 99 % (09/26 0852) FiO2 (%):  [40 %] 40 % (09/26 0852) Weight:  [57.5 kg (126 lb 12.2 oz)] 57.5 kg (126 lb 12.2 oz) (09/26 0347)  Intake/Output from previous day: 09/25 0701 - 09/26 0700 In: 1070 [I.V.:250; IV Piggyback:700] Out: 1750 [Urine:1650; Stool:100] Intake/Output this shift: Total I/O In: 510 [I.V.:10; NG/GT:250; IV Piggyback:250] Out: -   Moves extremities well. Has been requiring significant pain medication.  Lab Results:  Recent Labs  09/21/14 0508 09/22/14 0419  WBC 15.1* 18.2*  HGB 11.2* 11.7*  HCT 34.6* 37.2  PLT 249 282   BMET  Recent Labs  09/21/14 0508 09/22/14 0419  NA 146 147  K 3.8 3.1*  CL 107 107  CO2 29 28  GLUCOSE 97 103*  BUN 13 14  CREATININE 0.50 0.49*  CALCIUM 8.3* 8.2*    Studies/Results: Chest Portable 1 View To Assess Tube Placement And Rule-out Pneumothorax  09/21/2014   CLINICAL DATA:  Status post tube placement; assess for pneumothorax.  EXAM: PORTABLE CHEST - 1 VIEW  COMPARISON:  Portable chest x-ray of today's date at September 21, 2014  FINDINGS: The endotracheal tube is been removed and replaced with a tracheostomy appliance. The tip of the tracheostomy appliance lies at the level of the superior margin of the clavicular heads and is 6.7 cm above the crotch of the carina. There is no pneumothorax. The right subclavian venous catheter tip overlies the midportion of the SVC and is unchanged. The lungs are adequately inflated. The heart and pulmonary vascularity are normal. There is no pleural effusion.  IMPRESSION: There is no postprocedure complication following placement of the tracheostomy appliance. No significant  changes demonstrated within the chest elsewhere.   Electronically Signed   By: David  Martinique   On: 09/21/2014 12:50   Dg Chest Port 1 View  09/21/2014   CLINICAL DATA:  Syncope, seizure.  Subarachnoid hemorrhage.  EXAM: PORTABLE CHEST - 1 VIEW  COMPARISON:  Single view of the chest 09/20/2014 and 09/19/2014.  FINDINGS: Endotracheal tube and right subclavian catheter remain in place. There is a trace right pleural effusion. Small left pleural effusion and basilar atelectasis have decreased. No pneumothorax. Heart size is normal.  IMPRESSION: Decrease left pleural effusion. No other change. Decreased left pleural effusion and basilar atelectasis.   Electronically Signed   By: Inge Rise M.D.   On: 09/21/2014 07:24    Assessment/Plan: Status post subarachnoid hemorrhage day 11. Status post coiling of anterior communicating artery aneurysm. Mobilizing slowly. Observe for development of vasospasm  LOS: 11 days  Continue supportive care with fluid management ventilatory support and treatment for vasospasm.   Valina Maes J 09/22/2014, 10:03 AM

## 2014-09-22 NOTE — Progress Notes (Signed)
PT Cancellation Note  Patient Details Name: Mindy Mills MRN: [id number] DOB: April 16, 1951   Cancelled Treatment:    Reason Eval/Treat Not Completed: Patient's level of consciousness. Spoke with RN, pt recently administered haldol due to agitation. Will re-attempt to see pt when pt is more alert.   Elie Confer [place], [place] 09/22/2014, 12:29 PM

## 2014-09-22 NOTE — Progress Notes (Signed)
PULMONARY / CRITICAL CARE MEDICINE   Name: Mindy Mills MRN: [id number] DOB: 1951-05-30    ADMISSION DATE:  09/11/2014 CONSULTATION DATE:  09/22/2014  REFERRING MD :  Kathyrn Sheriff  CHIEF COMPLAINT:  Seizure  INITIAL PRESENTATION: 63 y.o. F brought to [facility] ED on 9/15 after a witnessed syncopal episode followed by a grand mal seizure while in the car.  Upon ED arrival, pt was unresponsive and not moving her right side.  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  She was intubated and underwent A com aneurysm coiling by  Dr. Leighton Ruff.  PCCM was consulted.  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 Echo 9/16 >>EF 25-30%, apical akinesis- LAD infarction pattern.Moderate diastolic dysfunction .  RVSP 50  SIGNIFICANT EVENTS: 9/15 - admitted with [facility] and right ACA aneurysm, underwent endovascular repair, returned to ICU on vent. 9/15 - A com aneurysm coiled 9/16 difficulty with panda 9/18 OGT placed by GI 9/21 VDRF, BiPAP overnight, required  9/23 extubated and reintubated 9/25 trach Hyman Bible)  SUBJECTIVE: Tolerating PS, alert and interactive.  VITAL SIGNS: Temp:  [98.2 F (36.8 C)-100.2 F (37.9 C)] 98.4 F (36.9 C) (09/26 0800) Pulse Rate:  [50-89] 89 (09/26 0852) Resp:  [11-28] 19 (09/26 0852) BP: (90-129)/(46-69) 116/68 mmHg (09/26 0852) SpO2:  [92 %-100 %] 99 % (09/26 0852) FiO2 (%):  [40 %] 40 % (09/26 0852) Weight:  [57.5 kg (126 lb 12.2 oz)] 57.5 kg (126 lb 12.2 oz) (09/26 0347)  HEMODYNAMICS:    VENTILATOR SETTINGS: Vent Mode:  [-] CPAP;PSV FiO2 (%):  [40 %] 40 % Set Rate:  [14 bmp] 14 bmp Vt Set:  [500 mL] 500 mL PEEP:  [5 cmH20] 5 cmH20 Pressure Support:  [8 cmH20] 8 cmH20 Plateau Pressure:  [13 cmH20-14 cmH20] 14 cmH20  INTAKE / OUTPUT: Intake/Output     09/25 0701 - 09/26 0700 09/26 0701 -  09/27 0700   I.V. (mL/kg) 250 (4.3) 10 (0.2)   Other 120    NG/GT  250   IV Piggyback 700 250   Total Intake(mL/kg) 1070 (18.6) 510 (8.9)   Urine (mL/kg/hr) 1650 (1.2)    Stool 100 (0.1)    Total Output 1750     Net -680 +510         PHYSICAL EXAMINATION: General: Chronically ill appearing elderly female, trach in place, tolerating PS well this AM. Neuro: Follows commands, alert and interactive. HEENT: [place]/AT. PERRL, sclerae anicteric. Cardiovascular: RRR, no M/R/G.  Lungs: Coarse BS diffusely but clearing Abdomen: BS x 4, soft, NT/ND.  Musculoskeletal: No gross deformities, no edema.  Skin: Intact, warm, no rashes.  LABS:  CBC  Recent Labs Lab 09/20/14 0540 09/21/14 0508 09/22/14 0419  WBC 15.5* 15.1* 18.2*  HGB 11.2* 11.2* 11.7*  HCT 36.4 34.6* 37.2  PLT 237 249 282   Coag's  Recent Labs Lab 09/21/14 0508  APTT 27  INR 1.12   BMET  Recent Labs Lab 09/20/14 0540 09/21/14 0508 09/22/14 0419  NA 147 146 147  K 3.7 3.8 3.1*  CL 110 107 107  CO2 27 29 28   BUN 13 13 14   CREATININE 0.51 0.50 0.49*  GLUCOSE 105* 97 103*   Electrolytes  Recent Labs Lab 09/20/14 0540 09/21/14 0508 09/22/14 0419  CALCIUM 8.1* 8.3* 8.2*  MG 1.8 1.9 2.1  PHOS 3.5 3.1 3.3   Sepsis Markers No results found for this basename: LATICACIDVEN, PROCALCITON, O2SATVEN,  in the last 168 hours  ABG  Recent Labs Lab 09/19/14 0358 09/19/14 1800 09/21/14 0254  PHART 7.410 7.383 7.445  PCO2ART 38.3 41.4 38.8  PO2ART 152.0* 112.0* 91.0   Liver Enzymes No results found for this basename: AST, ALT, ALKPHOS, BILITOT, ALBUMIN,  in the last 168 hours  Cardiac Enzymes No results found for this basename: TROPONINI, PROBNP,  in the last 168 hours  Glucose  Recent Labs Lab 09/21/14 1152 09/21/14 1535 09/21/14 2030 09/21/14 2335 09/22/14 0346 09/22/14 0809  GLUCAP 143* 124* 120* 129* 107* 99   Imaging CXR - BL ASD -decreased edema pattern  ASSESSMENT /  PLAN:  PULMONARY OETT 9/15 >>>9/16, 9/17 >>9/19, 9/21>>> A: VDRF - in setting of 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 Aspiration pneumonia P:   Attempt to get to [facility] today. D/Ced solumedrol, this is an airway protection issue.  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 Ct nimodipine. Further recommendations per neuro surg. PRN Haldol ordered.  CARDIOVASCULAR A:  Hx HTN Incomplete LBBB on EKG, nstemi Acute Pulmonary edema  NSTEMI - LAD pattern WMA on echo vs Takatsubo's P:  Cards consult appreciated. D/C lasix to avoid vasospasm.  RENAL A:   Hypokalemia Mild metabolic acidosis- resolved Hypernatremia   P:  Replace electrolytes as indicated. KVO IVF. BMET in AM.  GASTROINTESTINAL A:  Hx HCV Difficult panda placement P:   SUP: Pantoprazole. Continue TF.  HEMATOLOGIC A:   VTE Prophylaxis P:  SCD's only.  INFECTIOUS A:  Aspiration pneumonia resp cult 9/17 >> P:   D/Ced ceftx/ clinda Vanc 9/21>>> Zosyn 9/21>>> Pan culture 9/21>>>NTD  ENDOCRINE A:   Hyperglycemia  P:   CBG's q4hr. SSI.  Summary - Trach in place, advance to TC as tolerated, restart TF through PEG and will continue to monitor.  I have personally obtained a history, examined the patient, evaluated laboratory and imaging results, formulated the assessment and plan and placed orders.  CRITICAL CARE: The patient is critically ill with multiple organ systems failure and requires high complexity decision making for assessment and support, frequent evaluation and titration of therapies, application of advanced monitoring technologies and extensive interpretation of multiple databases. Critical Care Time devoted to patient care services described in this note is 35 minutes.   Rush Farmer, M.D. [facility]  Pulmonary/Critical Care Medicine. Pager: [phone]. After hours pager: [phone].  09/22/2014  10:05 AM

## 2014-09-22 NOTE — Progress Notes (Signed)
Pt placed on 28% ATC and tolerating well.  Vital signs stable, sats 97%.  RT will continue to monitor.

## 2014-09-22 NOTE — Progress Notes (Signed)
Patient ID: Mindy Mills, female   DOB: 12/19/1951, 63 y.o.   MRN: [id number] PEG site OK. Abdomen soft. OK to start TF. Keep binder on at all times. Georganna Skeans, MD, MPH, FACS Trauma: [phone] General Surgery: [phone]

## 2014-09-22 NOTE — Evaluation (Signed)
Physical Therapy Evaluation Patient Details Name: Mindy Mills MRN: [id number] DOB: February 24, 1951 Today's Date: 09/22/2014   History of Present Illness  63 y.o. F brought to [facility] ED on 9/15 after a witnessed syncopal episode followed by a grand mal seizure while in the car.  Upon ED arrival, pt was unresponsive and not moving her right side.  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  She was intubated and underwent A com aneurysm coiling by  Dr. Leighton Ruff.  PCCM was consulted.  She was reintubated 9/17 for pulmonary edema & hypoxia induced delerium. Pt with trach placed on 9/25.   Clinical Impression  Pt adm due to above. PTA pt was independent and working. Pt presents with deficits indicated below (see PT problem list). Pt to benefit from skilled acute PT to address deficits and maximize functional mobility. Pt reports she has good family support and will live with son upon D/C. Recommend CIR for further rehab to increase independence and decrease caregiver burden.     Follow Up Recommendations CIR    Equipment Recommendations  Other (comment) (TBD)    Recommendations for Other Services Rehab consult;OT consult     Precautions / Restrictions Precautions Precautions: Fall Precaution Comments: multiple lines/leads Restrictions Weight Bearing Restrictions: No      Mobility  Bed Mobility Overal bed mobility: Needs Assistance Bed Mobility: Supine to Sit     Supine to sit: Min assist;HOB elevated     General bed mobility comments: cues for safety and sequencing; pt appears to be neglectful and lack of purposeful movements on Rt side  Transfers Overall transfer level: Needs assistance Equipment used: 2 person hand held assist Transfers: Sit to/from [facility] Sit to Stand: +2 physical assistance;Mod assist Stand pivot transfers: +2 physical assistance;Mod assist       General transfer comment: pt with Rt LE blocking; required blocking of Rt LE; max  cues for safety and 2 person (A) to facilitate pivotal steps to chair  Ambulation/Gait             General Gait Details: pivotal steps only  Stairs            Wheelchair Mobility    Modified Rankin (Stroke Patients Only) Modified Rankin (Stroke Patients Only) Pre-Morbid Rankin Score: No symptoms Modified Rankin: Moderately severe disability     Balance Overall balance assessment: Needs assistance Sitting-balance support: Feet supported;Single extremity supported Sitting balance-Leahy Scale: Poor Sitting balance - Comments: required min (A) to maintain upright posture; pt with poor trunk control and LOB to Lt and Rt; sat EOB ~5 min; VSS Postural control: Left lateral lean;Right lateral lean Standing balance support: During functional activity;Bilateral upper extremity supported Standing balance-Leahy Scale: Zero Standing balance comment: 2 person (A) to balance ; Rt LE buckling                             Pertinent Vitals/Pain Pain Assessment: No/denies pain    Home Living Family/patient expects to be discharged to:: Inpatient rehab Living Arrangements: Children               Additional Comments: PTA pt lives with son     Prior Function Level of Independence: Independent         Comments: pt was working in [employer]" and independent with all ADLs     Hand Dominance        Extremity/Trunk Assessment   Upper Extremity Assessment: Defer to OT  evaluation           Lower Extremity Assessment: Difficult to assess due to impaired cognition (pt moving LEs non-purposeflly; neglectful of Lt UE/LE)      Cervical / Trunk Assessment: Normal  Communication   Communication: Other (comment) (trach collar; pt mouthing responses)  Cognition Arousal/Alertness: Awake/alert Behavior During Therapy: Impulsive;Restless Overall Cognitive Status: Impaired/Different from baseline Area of Impairment: Orientation;Attention;Following  commands;Safety/judgement;Awareness;Problem solving Orientation Level: Disoriented to;Place;Situation Current Attention Level: Sustained   Following Commands: Follows one step commands with increased time Safety/Judgement: Decreased awareness of deficits;Decreased awareness of safety Awareness: Intellectual Problem Solving: Slow processing;Difficulty sequencing;Requires verbal cues;Requires tactile cues General Comments: pt attempting to exit bed with all lines and leads upon PT entering room; pt restless and pulling at lines but can be calemd and refocused with cueing; verbalized by mouthing words due to trach collar    General Comments General comments (skin integrity, edema, etc.): pt with difficulty scanning room on Rt side; Looking Lt majority of session    Exercises        Assessment/Plan    PT Assessment Patient needs continued PT services  PT Diagnosis Difficulty walking;Generalized weakness   PT Problem List Decreased strength;Decreased activity tolerance;Decreased balance;Decreased mobility;Decreased cognition;Decreased knowledge of use of DME;Decreased safety awareness  PT Treatment Interventions DME instruction;Gait training;Stair training;Functional mobility training;Therapeutic activities;Therapeutic exercise;Balance training;Neuromuscular re-education;Cognitive remediation;Patient/family education;Wheelchair mobility training   PT Goals (Current goals can be found in the Care Plan section) Acute Rehab PT Goals Patient Stated Goal: none stated PT Goal Formulation: Patient unable to participate in goal setting Time For Goal Achievement: 09/29/14 Potential to Achieve Goals: Good    Frequency Min 4X/week   Barriers to discharge Inaccessible home environment has 8 STE house; reports son can be with ehr 24/7     Co-evaluation               End of Session Equipment Utilized During Treatment: Gait belt;Oxygen Activity Tolerance: Patient tolerated treatment  well Patient left: in chair;with call bell/phone within reach;with chair alarm set Nurse Communication: Mobility status;Precautions         Time: 2620-3559 PT Time Calculation (min): 22 min   Charges:   PT Evaluation $Initial PT Evaluation Tier I: 1 Procedure PT Treatments $Therapeutic Activity: 8-22 mins   PT G CodesGustavus Bryant, [place]  [phone] 09/22/2014, 4:02 PM

## 2014-09-23 LAB — GLUCOSE, CAPILLARY
GLUCOSE-CAPILLARY: 142 mg/dL — AB (ref 70–99)
GLUCOSE-CAPILLARY: 85 mg/dL (ref 70–99)
Glucose-Capillary: 109 mg/dL — ABNORMAL HIGH (ref 70–99)
Glucose-Capillary: 153 mg/dL — ABNORMAL HIGH (ref 70–99)
Glucose-Capillary: 126 mg/dL — ABNORMAL HIGH (ref 70–99)

## 2014-09-23 LAB — BASIC METABOLIC PANEL
Anion gap: 14 (ref 5–15)
BUN: 13 mg/dL (ref 6–23)
CHLORIDE: 107 meq/L (ref 96–112)
CO2: 25 meq/L (ref 19–32)
CREATININE: 0.51 mg/dL (ref 0.50–1.10)
Calcium: 8.6 mg/dL (ref 8.4–10.5)
GFR calc Af Amer: >90 mL/min (ref >90)
GFR calc non Af Amer: >90 mL/min (ref >90)
GLUCOSE: 140 mg/dL — AB (ref 70–99)
POTASSIUM: 3.2 meq/L — AB (ref 3.7–5.3)
Sodium: 146 mEq/L (ref 137–147)

## 2014-09-23 LAB — CULTURE, BLOOD (ROUTINE X 2)
CULTURE: NO GROWTH
Culture: NO GROWTH

## 2014-09-23 LAB — CBC
HEMATOCRIT: 36.1 % (ref 36.0–46.0)
HEMOGLOBIN: 11.9 g/dL — AB (ref 12.0–15.0)
MCH: 30.4 pg (ref 26.0–34.0)
MCHC: 33 g/dL (ref 30.0–36.0)
MCV: 92.1 fL (ref 78.0–100.0)
Platelets: 348 10*3/uL (ref 150–400)
RBC: 3.92 MIL/uL (ref 3.87–5.11)
RDW: 14.2 % (ref 11.5–15.5)
WBC: 22.5 10*3/uL — AB (ref 4.0–10.5)

## 2014-09-23 LAB — PHOSPHORUS: PHOSPHORUS: 3.3 mg/dL (ref 2.3–4.6)

## 2014-09-23 LAB — MAGNESIUM: Magnesium: 1.9 mg/dL (ref 1.5–2.5)

## 2014-09-23 MED ORDER — POTASSIUM CHLORIDE 20 MEQ/15ML (10%) PO LIQD
30.0000 meq | ORAL | Status: AC
  Administered 2014-09-23 (×2): 30 meq
  Filled 2014-09-23 (×2): qty 22.5

## 2014-09-23 NOTE — Progress Notes (Signed)
PULMONARY / CRITICAL CARE MEDICINE   Name: Mindy Mills MRN: [id number] DOB: 1951/07/14    ADMISSION DATE:  09/11/2014 CONSULTATION DATE:  09/23/2014  REFERRING MD :  Kathyrn Sheriff  CHIEF COMPLAINT:  Seizure  INITIAL PRESENTATION: 63 y.o. F brought to [facility] ED on 9/15 after a witnessed syncopal episode followed by a grand mal seizure while in the car.  Upon ED arrival, pt was unresponsive and not moving her right side.  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  She was intubated and underwent A com aneurysm coiling by  Dr. Leighton Ruff.  PCCM was consulted.  She was reintubated 9/17 for pulmonary edema & hypoxia induced delerium PMH - hepc, smoker, chronic headaches on narcotics  STUDIES:  CTA Head 9/15 >>> large SAH, 2.5 x 44mm right ACA aneurysm as source of SAH. Echo 9/16 >>EF 25-30%, apical akinesis- LAD infarction pattern.Moderate diastolic dysfunction .  RVSP 50  SIGNIFICANT EVENTS: 9/15 - admitted with [facility] and right ACA aneurysm, underwent endovascular repair, returned to ICU on vent. 9/15 - A com aneurysm coiled 9/16 difficulty with panda 9/18 OGT placed by GI 9/21 VDRF, BiPAP overnight, required  9/23 extubated and reintubated 9/25 trach Hyman Bible)  SUBJECTIVE: Tolerating PS, alert and interactive.  VITAL SIGNS: Temp:  [98 F (36.7 C)-100.5 F (38.1 C)] 98 F (36.7 C) (09/27 0744) Pulse Rate:  [28-86] 70 (09/27 0900) Resp:  [14-22] 21 (09/27 1000) BP: (97-134)/(43-67) 114/46 mmHg (09/27 1000) SpO2:  [82 %-100 %] 91 % (09/27 0900) FiO2 (%):  [21 %-40 %] 28 % (09/27 0853) Weight:  [56.2 kg (123 lb 14.4 oz)] 56.2 kg (123 lb 14.4 oz) (09/27 0408)  HEMODYNAMICS:    VENTILATOR SETTINGS: Vent Mode:  [-] Stand-by FiO2 (%):  [21 %-40 %] 28 % Set Rate:  [14 bmp] 14 bmp Vt Set:  [500 mL] 500 mL PEEP:  [5 cmH20] 5 cmH20 Plateau Pressure:  [13 cmH20-14 cmH20] 14 cmH20  INTAKE / OUTPUT: Intake/Output     09/26 0701 - 09/27 0700 09/27 0701 - 09/28 0700   I.V. (mL/kg) 240  (4.3)    Other 90 60   NG/GT 1070 400   IV Piggyback 700 200   Total Intake(mL/kg) 2100 (37.4) 660 (11.7)   Urine (mL/kg/hr) 1660 (1.2)    Stool     Total Output 1660     Net +440 +660         PHYSICAL EXAMINATION: General: Chronically ill appearing elderly female, trach in place, tolerating TC well this AM. Neuro: Follows commands, alert and interactive. HEENT: /AT. PERRL, sclerae anicteric. Cardiovascular: RRR, no M/R/G.  Lungs: Coarse BS diffusely but clearing Abdomen: BS x 4, soft, NT/ND.  Musculoskeletal: No gross deformities, no edema.  Skin: Intact, warm, no rashes.  LABS:  CBC  Recent Labs Lab 09/21/14 0508 09/22/14 0419 09/23/14 0358  WBC 15.1* 18.2* 22.5*  HGB 11.2* 11.7* 11.9*  HCT 34.6* 37.2 36.1  PLT 249 282 348   Coag's  Recent Labs Lab 09/21/14 0508  APTT 27  INR 1.12   BMET  Recent Labs Lab 09/21/14 0508 09/22/14 0419 09/23/14 0358  NA 146 147 146  K 3.8 3.1* 3.2*  CL 107 107 107  CO2 29 28 25   BUN 13 14 13   CREATININE 0.50 0.49* 0.51  GLUCOSE 97 103* 140*   Electrolytes  Recent Labs Lab 09/21/14 0508 09/22/14 0419 09/23/14 0358  CALCIUM 8.3* 8.2* 8.6  MG 1.9 2.1 1.9  PHOS 3.1 3.3 3.3  Sepsis Markers No results found for this basename: LATICACIDVEN, PROCALCITON, O2SATVEN,  in the last 168 hours  ABG  Recent Labs Lab 09/19/14 0358 09/19/14 1800 09/21/14 0254  PHART 7.410 7.383 7.445  PCO2ART 38.3 41.4 38.8  PO2ART 152.0* 112.0* 91.0   Liver Enzymes No results found for this basename: AST, ALT, ALKPHOS, BILITOT, ALBUMIN,  in the last 168 hours  Cardiac Enzymes No results found for this basename: TROPONINI, PROBNP,  in the last 168 hours  Glucose  Recent Labs Lab 09/22/14 1213 09/22/14 1559 09/22/14 2026 09/22/14 2338 09/23/14 0438 09/23/14 0742  GLUCAP 118* 114* 134* 96 142* 109*   Imaging CXR - BL ASD -decreased edema pattern  ASSESSMENT / PLAN:  PULMONARY OETT 9/15 >>>9/16, 9/17 >>9/19,  9/21>>> A: VDRF - in setting of 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 Aspiration pneumonia P:   Maintain on TC as tolerated PT/OT OOB D/Ced solumedrol, this is an airway protection issue.  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 Ct nimodipine. Further recommendations per neuro surg. PRN Haldol ordered. Hold in the ICU til Tuesday then will likely be ready for LTAC/SDU as vasospasm will no longer be an issue.  CARDIOVASCULAR A:  Hx HTN Incomplete LBBB on EKG, nstemi Acute Pulmonary edema  NSTEMI - LAD pattern WMA on echo vs Takatsubo's P:  Cards consult appreciated. D/C lasix to avoid vasospasm.  RENAL A:   Hypokalemia Mild metabolic acidosis- resolved Hypernatremia   P:  Replace electrolytes as indicated. KVO IVF. BMET in AM.  GASTROINTESTINAL A:  Hx HCV Difficult panda placement P:   SUP: Pantoprazole. Continue TF.  HEMATOLOGIC A:   VTE Prophylaxis P:  SCD's only.  INFECTIOUS A:  Aspiration pneumonia resp cult 9/17 >> P:   D/Ced ceftx/ clinda Vanc 9/21>>> Zosyn 9/21>>> Pan culture 9/21>>>NTD  ENDOCRINE A:   Hyperglycemia  P:   CBG's q4hr. SSI.  Summary - Trach in place, attempt TC again today and hopefully will be able to continue for 24 hours.  Replace electrolytes, LTAC consult in place but not ready to leave til Tuesday.  I have personally obtained a history, examined the patient, evaluated laboratory and imaging results, formulated the assessment and plan and placed orders.  CRITICAL CARE: The patient is critically ill with multiple organ systems failure and requires high complexity decision making for assessment and support, frequent evaluation and titration of therapies, application of advanced monitoring technologies and extensive interpretation of  multiple databases. Critical Care Time devoted to patient care services described in this note is 35 minutes.   Rush Farmer, M.D. [facility] Pulmonary/Critical Care Medicine. Pager: [phone]. After hours pager: [phone].  09/23/2014  11:04 AM

## 2014-09-23 NOTE — Progress Notes (Signed)
Placed pt. Back on vent due to pt. desatting and pt. Appeared to be breathing heavier. Pt. Resting well now on full support.

## 2014-09-23 NOTE — Progress Notes (Signed)
[facility] ADULT ICU REPLACEMENT PROTOCOL FOR AM LAB REPLACEMENT ONLY  The patient does apply for the [facility] Adult ICU Electrolyte Replacment Protocol based on the criteria listed below:   1. Is GFR >/= 40 ml/min? Yes.    Patient's GFR today is >90 2. Is urine output >/= 0.5 ml/kg/hr for the last 6 hours? Yes.   Patient's UOP is 0.9 ml/kg/hr 3. Is BUN < 60 mg/dL? Yes.    Patient's BUN today is 13 4. Abnormal electrolyte(s): K 3.2 5. Ordered repletion with: per protocol 6. If a panic level lab has been reported, has the CCM MD in charge been notified? No..   Physician:    Ronda Fairly A 09/23/2014 4:56 AM

## 2014-09-23 NOTE — Progress Notes (Signed)
Patient ID: Mindy Mills, female   DOB: 29-May-1951, 63 y.o.   MRN: [id number] Patient appears alert oriented but agitated Breathing comfortably through trach Being observed for potential of vasospasm. Continues fluid supplementation.

## 2014-09-24 ENCOUNTER — Encounter (HOSPITAL_COMMUNITY): Payer: Self-pay | Admitting: General Surgery

## 2014-09-24 ENCOUNTER — Inpatient Hospital Stay
Admit: 2014-09-24 | Discharge: 2014-10-15 | Disposition: A | Discharge status: Skilled Nursing Facility | Payer: Self-pay | Source: Ambulatory Visit | Attending: Internal Medicine | Admitting: Internal Medicine

## 2014-09-24 ENCOUNTER — Other Ambulatory Visit (HOSPITAL_COMMUNITY): Payer: Self-pay

## 2014-09-24 DIAGNOSIS — I2489 Other forms of acute ischemic heart disease: Secondary | ICD-10-CM | POA: Diagnosis present

## 2014-09-24 DIAGNOSIS — I608 Other nontraumatic subarachnoid hemorrhage: Secondary | ICD-10-CM | POA: Diagnosis present

## 2014-09-24 DIAGNOSIS — I428 Other cardiomyopathies: Secondary | ICD-10-CM

## 2014-09-24 DIAGNOSIS — I248 Other forms of acute ischemic heart disease: Secondary | ICD-10-CM | POA: Diagnosis present

## 2014-09-24 DIAGNOSIS — R58 Hemorrhage, not elsewhere classified: Secondary | ICD-10-CM

## 2014-09-24 DIAGNOSIS — I609 Nontraumatic subarachnoid hemorrhage, unspecified: Secondary | ICD-10-CM

## 2014-09-24 DIAGNOSIS — R4182 Altered mental status, unspecified: Secondary | ICD-10-CM | POA: Diagnosis present

## 2014-09-24 DIAGNOSIS — J9600 Acute respiratory failure, unspecified whether with hypoxia or hypercapnia: Secondary | ICD-10-CM | POA: Diagnosis present

## 2014-09-24 DIAGNOSIS — J9620 Acute and chronic respiratory failure, unspecified whether with hypoxia or hypercapnia: Secondary | ICD-10-CM | POA: Diagnosis present

## 2014-09-24 DIAGNOSIS — Z930 Tracheostomy status: Secondary | ICD-10-CM

## 2014-09-24 DIAGNOSIS — I429 Cardiomyopathy, unspecified: Secondary | ICD-10-CM

## 2014-09-24 LAB — BASIC METABOLIC PANEL
ANION GAP: 13 (ref 5–15)
BUN: 14 mg/dL (ref 6–23)
CHLORIDE: 110 meq/L (ref 96–112)
CO2: 23 mEq/L (ref 19–32)
Calcium: 8.5 mg/dL (ref 8.4–10.5)
Creatinine, Ser: 0.43 mg/dL — ABNORMAL LOW (ref 0.50–1.10)
GFR calc Af Amer: >90 mL/min (ref >90)
GFR calc non Af Amer: >90 mL/min (ref >90)
Glucose, Bld: 140 mg/dL — ABNORMAL HIGH (ref 70–99)
Potassium: 3.1 mEq/L — ABNORMAL LOW (ref 3.7–5.3)
Sodium: 146 mEq/L (ref 137–147)

## 2014-09-24 LAB — GLUCOSE, CAPILLARY
Glucose-Capillary: 124 mg/dL — ABNORMAL HIGH (ref 70–99)
Glucose-Capillary: 139 mg/dL — ABNORMAL HIGH (ref 70–99)
Glucose-Capillary: 147 mg/dL — ABNORMAL HIGH (ref 70–99)
Glucose-Capillary: 154 mg/dL — ABNORMAL HIGH (ref 70–99)

## 2014-09-24 LAB — COMPREHENSIVE METABOLIC PANEL
ALBUMIN: 2.9 g/dL — AB (ref 3.5–5.2)
ALK PHOS: 60 U/L (ref 39–117)
ALT: 20 U/L (ref 0–35)
AST: 17 U/L (ref 0–37)
Anion gap: 15 (ref 5–15)
BUN: 11 mg/dL (ref 6–23)
CALCIUM: 8.7 mg/dL (ref 8.4–10.5)
CO2: 22 mEq/L (ref 19–32)
Chloride: 108 mEq/L (ref 96–112)
Creatinine, Ser: 0.4 mg/dL — ABNORMAL LOW (ref 0.50–1.10)
GFR calc non Af Amer: >90 mL/min (ref >90)
Glucose, Bld: 122 mg/dL — ABNORMAL HIGH (ref 70–99)
POTASSIUM: 3.7 meq/L (ref 3.7–5.3)
SODIUM: 145 meq/L (ref 137–147)
TOTAL PROTEIN: 6.8 g/dL (ref 6.0–8.3)
Total Bilirubin: 0.6 mg/dL (ref 0.3–1.2)

## 2014-09-24 LAB — PHOSPHORUS
PHOSPHORUS: 2.6 mg/dL (ref 2.3–4.6)
Phosphorus: 3 mg/dL (ref 2.3–4.6)

## 2014-09-24 LAB — CBC
HCT: 35 % — ABNORMAL LOW (ref 36.0–46.0)
HEMOGLOBIN: 11.3 g/dL — AB (ref 12.0–15.0)
MCH: 30 pg (ref 26.0–34.0)
MCHC: 32.3 g/dL (ref 30.0–36.0)
MCV: 92.8 fL (ref 78.0–100.0)
PLATELETS: 391 10*3/uL (ref 150–400)
RBC: 3.77 MIL/uL — AB (ref 3.87–5.11)
RDW: 14.6 % (ref 11.5–15.5)
WBC: 23.5 10*3/uL — ABNORMAL HIGH (ref 4.0–10.5)

## 2014-09-24 LAB — MAGNESIUM
MAGNESIUM: 1.8 mg/dL (ref 1.5–2.5)
Magnesium: 2 mg/dL (ref 1.5–2.5)

## 2014-09-24 MED ORDER — LEVETIRACETAM 100 MG/ML PO SOLN
500.0000 mg | Freq: Two times a day (BID) | ORAL | Status: DC
  Filled 2014-09-24: qty 5

## 2014-09-24 MED ORDER — VITAL AF 1.2 CAL PO LIQD: 1000.0000 mL | ORAL | Status: DC

## 2014-09-24 MED ORDER — BUDESONIDE 0.5 MG/2ML IN SUSP: 0.5000 mg | Freq: Two times a day (BID) | RESPIRATORY_TRACT | Status: DC

## 2014-09-24 MED ORDER — PANTOPRAZOLE SODIUM 40 MG PO PACK
40.0000 mg | PACK | Freq: Every day | ORAL | Status: DC
  Filled 2014-09-24: qty 20

## 2014-09-24 MED ORDER — ZOLPIDEM TARTRATE 5 MG PO TABS: 5.0000 mg | ORAL_TABLET | Freq: Every evening | ORAL | Status: DC | PRN

## 2014-09-24 MED ORDER — LEVETIRACETAM IN NACL 500 MG/100ML IV SOLN: 500.0000 mg | Freq: Two times a day (BID) | INTRAVENOUS | Status: DC

## 2014-09-24 MED ORDER — FREE WATER: 250.0000 mL | Freq: Four times a day (QID) | Status: DC

## 2014-09-24 MED ORDER — POTASSIUM CHLORIDE 20 MEQ/15ML (10%) PO LIQD
40.0000 meq | Freq: Once | ORAL | Status: AC
  Administered 2014-09-24: 40 meq
  Filled 2014-09-24: qty 30

## 2014-09-24 MED ORDER — PANTOPRAZOLE SODIUM 40 MG IV SOLR: 40.0000 mg | Freq: Every day | INTRAVENOUS | Status: DC

## 2014-09-24 MED ORDER — ALBUTEROL SULFATE (2.5 MG/3ML) 0.083% IN NEBU: 2.5000 mg | INHALATION_SOLUTION | RESPIRATORY_TRACT | Status: DC | PRN

## 2014-09-24 MED ORDER — BISACODYL 10 MG RE SUPP: 10.0000 mg | Freq: Every day | RECTAL | Status: DC | PRN

## 2014-09-24 MED ORDER — IPRATROPIUM-ALBUTEROL 0.5-2.5 (3) MG/3ML IN SOLN: 3.0000 mL | Freq: Four times a day (QID) | RESPIRATORY_TRACT | Status: DC

## 2014-09-24 MED ORDER — MAGNESIUM SULFATE 40 MG/ML IJ SOLN
2.0000 g | Freq: Once | INTRAMUSCULAR | Status: AC
  Administered 2014-09-24: 2 g via INTRAVENOUS
  Filled 2014-09-24 (×2): qty 50

## 2014-09-24 MED ORDER — NIMODIPINE 60 MG/20ML PO SOLN: 60.0000 mg | ORAL | Status: DC

## 2014-09-24 NOTE — Progress Notes (Signed)
Pt seen and examined. No issues overnight.   EXAM: Temp:  [98.1 F (36.7 C)-98.8 F (37.1 C)] 98.8 F (37.1 C) (09/28 0800) Pulse Rate:  [56-98] 98 (09/28 0700) Resp:  [15-35] 27 (09/28 0700) BP: (76-150)/(42-105) 134/55 mmHg (09/28 0700) SpO2:  [91 %-100 %] 94 % (09/28 0700) FiO2 (%):  [28 %-40 %] 28 % (09/28 0644) Weight:  [57.6 kg (126 lb 15.8 oz)] 57.6 kg (126 lb 15.8 oz) (09/28 0455) Intake/Output     09/27 0701 - 09/28 0700 09/28 0701 - 09/29 0700   I.V. (mL/kg) 140 (2.4)    Other 250    NG/GT 950    IV Piggyback 450    Total Intake(mL/kg) 1790 (31.1)    Urine (mL/kg/hr) 2050 (1.5)    Stool 400 (0.3)    Total Output 2450     Net -660           On trach collar Awake, alert Nods appropriately to questions CN grossly intact, Face symmetric Good strength throughout, subtle RUE drift  LABS: Lab Results  Component Value Date   CREATININE 0.43* 09/24/2014   BUN 14 09/24/2014   NA 146 09/24/2014   K 3.1* 09/24/2014   CL 110 09/24/2014   CO2 23 09/24/2014   Lab Results  Component Value Date   WBC 23.5* 09/24/2014   HGB 11.3* 09/24/2014   HCT 35.0* 09/24/2014   MCV 92.8 09/24/2014   PLT 391 09/24/2014    IMPRESSION: - 62 y.o. female SAH d#14 s/p Acom coiling, neurologically stable - VDRF, s/p trach. Tolerating TC for now - Stress cardiomyopathy  PLAN: - Has done well from neurologic standpoint. Can consider transfer from ICU to stepdown/LTACH once stable from respiratory standpoint - Cont nimotop for total of 21 days - Will cont Keppra

## 2014-09-24 NOTE — Progress Notes (Addendum)
PULMONARY / CRITICAL CARE MEDICINE   Name: Mindy Mills MRN: [id number] DOB: 04-20-1951    ADMISSION DATE:  09/11/2014 CONSULTATION DATE:  09/24/2014  REFERRING MD :  Kathyrn Sheriff  CHIEF COMPLAINT:  Seizure  INITIAL PRESENTATION: 63 y.o. F brought to [facility] ED on 9/15 after a witnessed syncopal episode followed by a grand mal seizure while in the car.  Upon ED arrival, pt was unresponsive and not moving her right side.  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  She was intubated and underwent A com aneurysm coiling by  Dr. Leighton Ruff.  PCCM was consulted.  She was reintubated 9/17 for pulmonary edema & hypoxia induced delerium PMH - hepc, smoker, chronic headaches on narcotics  STUDIES:  CTA Head 9/15 >>> large SAH, 2.5 x 64mm right ACA aneurysm as source of SAH. Echo 9/16 >>EF 25-30%, apical akinesis- LAD infarction pattern.Moderate diastolic dysfunction .  RVSP 50  SIGNIFICANT EVENTS: 9/15 - admitted with [facility] and right ACA aneurysm, underwent endovascular repair, returned to ICU on vent. 9/15 - A com aneurysm coiled 9/16 difficulty with panda 9/18 OGT placed by GI 9/21 VDRF, BiPAP overnight, required  9/23 extubated and reintubated 9/25 trach Hyman Bible) 9/27:  Tolerating PS, alert and interactive.    SUBJECTIVE/OVERNIGHT/INTERVAL HX 09/24/14: Insomnia overnight and restless this AM.RX with opioids but ddid not help.  But this is unchanged past few days. However, WBC up. Rested on vent overnight. TC this Am  VITAL SIGNS: Temp:  [98.1 F (36.7 C)-98.8 F (37.1 C)] 98.8 F (37.1 C) (09/28 0800) Pulse Rate:  [56-98] 98 (09/28 0700) Resp:  [15-35] 27 (09/28 0700) BP: (76-150)/(42-105) 134/55 mmHg (09/28 0700) SpO2:  [92 %-100 %] 94 % (09/28 0700) FiO2 (%):  [28 %-40 %] 28 % (09/28 0644) Weight:  [57.6 kg (126 lb 15.8 oz)] 57.6 kg (126 lb 15.8 oz) (09/28 0455)  HEMODYNAMICS:    VENTILATOR SETTINGS: Vent Mode:  [-] PRVC FiO2 (%):  [28 %-40 %] 28 % Set Rate:  [14 bmp] 14  bmp Vt Set:  [500 mL] 500 mL PEEP:  [5 cmH20] 5 cmH20 Plateau Pressure:  [9 cmH20-19 cmH20] 9 cmH20  INTAKE / OUTPUT: Intake/Output     09/27 0701 - 09/28 0700 09/28 0701 - 09/29 0700   I.V. (mL/kg) 140 (2.4)    Other 250    NG/GT 950    IV Piggyback 450    Total Intake(mL/kg) 1790 (31.1)    Urine (mL/kg/hr) 2050 (1.5)    Stool 400 (0.3)    Total Output 2450     Net -660           PHYSICAL EXAMINATION: General: Chronically ill appearing elderly female, trach in place, tolerating TC well this AM. Neuro: Follows commands, alert and interactive but at same time restless and moveing all 4s - RASS + 2 equivalent HEENT: [place]/AT. PERRL, sclerae anicteric. TRach site clean Cardiovascular: RRR, no M/R/G.  Lungs: Coarse BS diffusely , no distress Abdomen: BS x 4, soft, NT/ND.  Musculoskeletal: No gross deformities, no edema.  Skin: Intact, warm, no rashes.  LABS:  PULMONARY  Recent Labs Lab 09/17/14 1035 09/18/14 0503 09/19/14 0358 09/19/14 1800 09/21/14 0254  PHART 7.434 7.435 7.410 7.383 7.445  PCO2ART 42.2 35.7 38.3 41.4 38.8  PO2ART 230.0* 65.4* 152.0* 112.0* 91.0  HCO3 27.4* 23.6 23.8 24.1* 26.2*  TCO2 28.6 24.6 25.0 25.4 27.4  O2SAT 99.9 92.4 99.1 97.8 96.9    CBC  Recent Labs Lab 09/22/14 0419 09/23/14 0358  09/24/14 0510  HGB 11.7* 11.9* 11.3*  HCT 37.2 36.1 35.0*  WBC 18.2* 22.5* 23.5*  PLT 282 348 391    COAGULATION  Recent Labs Lab 09/21/14 0508  INR 1.12    CARDIAC  No results found for this basename: TROPONINI,  in the last 168 hours No results found for this basename: PROBNP,  in the last 168 hours   CHEMISTRY  Recent Labs Lab 09/20/14 0540 09/21/14 0508 09/22/14 0419 09/23/14 0358 09/24/14 0510  NA 147 146 147 146 146  K 3.7 3.8 3.1* 3.2* 3.1*  CL 110 107 107 107 110  CO2 27 29 28 25 23   GLUCOSE 105* 97 103* 140* 140*  BUN 13 13 14 13 14   CREATININE 0.51 0.50 0.49* 0.51 0.43*  CALCIUM 8.1* 8.3* 8.2* 8.6 8.5  MG 1.8 1.9 2.1  1.9 1.8  PHOS 3.5 3.1 3.3 3.3 3.0   Estimated Creatinine Clearance: 60.3 ml/min (by C-G formula based on Cr of 0.43).   LIVER  Recent Labs Lab 09/21/14 0508  INR 1.12     INFECTIOUS No results found for this basename: LATICACIDVEN, PROCALCITON,  in the last 168 hours   ENDOCRINE CBG (last 3)   Recent Labs  09/24/14 0017 09/24/14 0452 09/24/14 0814  GLUCAP 147* 124* 139*         IMAGING x48h No results found.     ASSESSMENT / PLAN:  PULMONARY OETT 9/15 >>>9/16, 9/17 >>9/19, 9/21 >>>09/21/14 (Trach by Nelda Marseille) A: Baseline: Likely COPD VDRF - in setting of acute SAH secondary to right ACA aneurysm  - course complicated by Acute pulmonary edema and ? 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 Ct nimodipine. Further recommendations per neuro surg. PRN Haldol ordered. Prn ambient at night starting 09/24/14 Hold in the ICU til Tuesday then will likely be ready for LTAC/SDU as vasospasm will no longer be an issue.  CARDIOVASCULAR A:  Hx HTN Incomplete LBBB on EKG, nstemi Acute Pulmonary edema  NSTEMI - LAD pattern WMA on echo vs Takatsubo's.    - [facility] cards consult 09/20/14 recommends relook echo around 10/11/14 - 10/20/14 and Myoview at some point in future   P:  Off  lasix to avoid vasospasm.  RENAL A:   Hypokalemia Hypomag  P:  Replace electrolytes as indicated; done 09/24/14 [facility] IVF. BMET as needed  GASTROINTESTINAL A:  Hx HCV Difficult panda placement - due to ? Hiatal hernia at admit S/p PEG 09/21/14  P:   SUP:  Pantoprazole. Continue TF.  HEMATOLOGIC A:   VTE Prophylaxis P:  SCD's only.  INFECTIOUS A:  Aspiration pneumonia resp cult 9/17 >>negative     P:   cefti 9/17 >.9/27 Vanc 9/21>>> 9/27 Zosyn 9/21>>> 9/27    ENDOCRINE A:   Hyperglycemia  P:   CBG's q4hr. SSI.  Summary - For LTAC 09/24/2014. CCM [facility] Updated son over phone: he wants to make sure echo is repeated in few weeks    Dr. Brand Males, M.D., [facility].C.P Pulmonary and Critical Care Medicine  Staff [place] [facility] Pager: [phone], If no answer or between  15:00h - 7:00h: call [phone]  09/24/2014 9:45 AM

## 2014-09-24 NOTE — Progress Notes (Signed)
Rehab Admissions Coordinator Note:  Patient was screened by Retta Diones for appropriateness for an Inpatient Acute Rehab Consult.  At this time, we are recommending Inpatient Rehab consult.  Retta Diones 09/24/2014, 9:49 AM  I can be reached at [phone].

## 2014-09-24 NOTE — Progress Notes (Signed)
Patient discharged to [facility] per wheelchair with oxygen via trach collar with trach inplace   with nurses x 2 with no noted distress.

## 2014-09-24 NOTE — Progress Notes (Signed)
Physical Therapy Treatment Patient Details Name: Mindy Mills MRN: [id number] DOB: [birth year] Today's Date: 09/24/2014    History of Present Illness 64 y.o. F brought to [facility] ED on 9/15 after a witnessed syncopal episode followed by a grand mal seizure while in the car.  Upon ED arrival, pt was unresponsive and not moving her right side.  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  She was intubated and underwent A com aneurysm coiling by  Dr. Leighton Ruff.  PCCM was consulted.  She was reintubated 9/17 for pulmonary edema & hypoxia induced delerium. Pt with trach placed on 9/25.     PT Comments    Pt continues to be restless and impulsive.  Pt is eager for mobility, but with poor awareness of deficits.  Feel pt would be great CIR candidate for continued rehab.  Will continue to follow.    Follow Up Recommendations  CIR     Equipment Recommendations   (TBD)    Recommendations for Other Services Rehab consult;OT consult     Precautions / Restrictions Precautions Precautions: Fall Precaution Comments: G-tube with abdominal binder and flexiseal.   Restrictions Weight Bearing Restrictions: No    Mobility  Bed Mobility Overal bed mobility: Needs Assistance Bed Mobility: Supine to Sit     Supine to sit: Min assist;HOB elevated     General bed mobility comments: A with management of lines and for balance.    Transfers Overall transfer level: Needs assistance Equipment used: 2 person hand held assist Transfers: Sit to/from [facility] Sit to Stand: +2 physical assistance;Mod assist Stand pivot transfers: +2 physical assistance;Mod assist       General transfer comment: pt impulsive and begins to lean anteriorly with mention of coming to stand.  Needs cueing to wait for A.  pt able to stand x 13mins for peri hygiene as flexiseal was leaking.  R LE blocked.  A with wt shifting and movement of R LE through pivot towards L side.    Ambulation/Gait                  Stairs            Wheelchair Mobility    Modified Rankin (Stroke Patients Only) Modified Rankin (Stroke Patients Only) Pre-Morbid Rankin Score: No symptoms Modified Rankin: Moderately severe disability     Balance Overall balance assessment: Needs assistance Sitting-balance support: Feet supported;Bilateral upper extremity supported Sitting balance-Leahy Scale: Poor Sitting balance - Comments: pt only able to maintain balance for <62min without A.  pt tends to fluctuate between leaning anteriorly and posteriorly.     Standing balance support: Bilateral upper extremity supported;During functional activity Standing balance-Leahy Scale: Poor                      Cognition Arousal/Alertness: Awake/alert Behavior During Therapy: Restless;Impulsive Overall Cognitive Status: Impaired/Different from baseline                 General Comments: Difficult to assess cognition as pt with trach, however appears impulsive and restless.  pt attempts to mouth words to PT and only some understandable.  pt needs increased time to follow directions.      Exercises      General Comments        Pertinent Vitals/Pain Pain Assessment: No/denies pain    Home Living                      Prior  Function            PT Goals (current goals can now be found in the care plan section) Acute Rehab PT Goals Patient Stated Goal: none stated PT Goal Formulation: Patient unable to participate in goal setting Time For Goal Achievement: 09/29/14 Potential to Achieve Goals: Good Progress towards PT goals: Progressing toward goals    Frequency  Min 4X/week    PT Plan Current plan remains appropriate    Co-evaluation             End of Session Equipment Utilized During Treatment: Gait belt;Oxygen Activity Tolerance: Patient tolerated treatment well Patient left: in chair;with call bell/phone within reach;with chair alarm set     Time:  6945-0388 PT Time Calculation (min): 29 min  Charges:  $Therapeutic Activity: 23-37 mins                    G CodesCatarina Hartshorn, [place] 09/24/2014, 12:27 PM

## 2014-09-24 NOTE — Progress Notes (Signed)
UR completed. Medicare IM (Important Message) delivered to patient's son today by me in anticipation of discharge.    Sandi Mariscal, RN BSN [place] CCM Trauma/Neuro ICU Case Manager [phone]

## 2014-09-24 NOTE — Progress Notes (Signed)
NUTRITION FOLLOW-UP  INTERVENTION: Continue Vital AF 1.2 @ 50 ml/hr via PEG.   Tube feeding regimen provides 1440 kcal (100% of needs), 90 grams of protein, and 973 ml of H2O.   NUTRITION DIAGNOSIS: Inadequate oral intake related to inability to eat as evidenced by NPO status; ongoing.   Goal: Pt to meet >/= 90% of their estimated nutrition needs; met.    Monitor:  Respiratory status, TF tolerance, labs  ASSESSMENT: Pt admitted after witnessed grand mal seizure. 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 Pt self extubated 9/16 but re-intubated 9/17. Pt extubated 9/20 but re-intubated 9/21.   Pt extubated 9/23 but was re-intubated same day. TF held in anticipation of extubation on 9/23 and not restarted with planned trach/PEG.  Pt is now s/p trach and PEG 9/25, TF resumed 9/26. Per RT pt doing well on trach collar, today's goal is trach collar x 24 hrs.  Pt discussed during ICU rounds and with RN.  Per RN pt keeps taking off her trach collar.    Potassium low.  Vital AF 1.2 @ 50 provides: 1440 kcal, 90 grams protein, and 973 ml H2O. Free water: 200 ml every 6 hours Total free water: 1773 ml    Height: Ht Readings from Last 1 Encounters:  09/13/14 '5\' 3"'  (1.6 m)    Weight: Wt Readings from Last 1 Encounters:  09/24/14 126 lb 15.8 oz (57.6 kg)  Weight range: 123-151 lb   BMI:  Body mass index is 22.5 kg/(m^2).  Estimated Nutritional Needs: Kcal: 1400-1600 Protein: 85-100 grams Fluid: > 1.5 L/day  Skin: intact  Diet Order: NPO   Intake/Output Summary (Last 24 hours) at 09/24/14 1023 Last data filed at 09/24/14 3335  Gross per 24 hour  Intake   1130 ml  Output   2450 ml  Net  -1320 ml    Last BM: 9/27 via rectal pouch (inserted 9/25) 400 ml 9/27 0 documented 9/26 100 ml 9/25  Labs:   Recent Labs Lab 09/22/14 0419 09/23/14 0358 09/24/14 0510  NA 147 146 146  K 3.1* 3.2* 3.1*  CL 107 107 110  CO2 '28 25 23  ' BUN  '14 13 14  ' CREATININE 0.49* 0.51 0.43*  CALCIUM 8.2* 8.6 8.5  MG 2.1 1.9 1.8  PHOS 3.3 3.3 3.0  GLUCOSE 103* 140* 140*    CBG (last 3)   Recent Labs  09/24/14 0017 09/24/14 0452 09/24/14 0814  GLUCAP 147* 124* 139*    Scheduled Meds: . antiseptic oral rinse  7 mL Mouth Rinse QID  . budesonide  0.5 mg Nebulization BID  . chlorhexidine  15 mL Mouth Rinse BID  . etomidate  40 mg Intravenous Once  . fentaNYL  200 mcg Intravenous Once  . free water  250 mL Per Tube Q6H  . haloperidol lactate  5 mg Intravenous Once  . insulin aspart  0-15 Units Subcutaneous 6 times per day  . ipratropium-albuterol  3 mL Nebulization Q6H  . levETIRAcetam  500 mg Intravenous Q12H  . magnesium sulfate 1 - 4 g bolus IVPB  2 g Intravenous Once  . methylPREDNISolone (SOLU-MEDROL) injection  40 mg Intravenous Q12H  . midazolam  4 mg Intravenous Once  . niMODipine  60 mg Oral Q4H   Or  . NiMODipine  60 mg Per Tube Q4H  . pantoprazole (PROTONIX) IV  40 mg Intravenous QHS  . propofol  5-70 mcg/kg/min Intravenous Once    Continuous Infusions: .  dextrose 10 mL/hr at 09/24/14 0624  . feeding supplement (VITAL AF 1.2 CAL) 1,000 mL (09/23/14 1106)    [place], [place], [place] Pager [phone] After Hours Pager

## 2014-09-24 NOTE — Discharge Summary (Signed)
Physician Discharge Summary  Patient ID: Mindy Mills MRN: [id number] DOB/AGE: Apr 16, 1951 63 y.o.  Admit date: 09/11/2014 Discharge date: 09/24/2014  Admission Diagnoses:  1. Subarachnoid Hemorrhage  Discharge Diagnoses: Same Active Problems:   Subarachnoid hemorrhage due to ruptured aneurysm   Acute respiratory failure   Hypokalemia   Altered mental status   Subarachnoid hemorrhage   Demand ischemia of myocardium: in setting of CVA   Cardiomyopathy, idiopathic - in setting of [place], LAD WMA on Echo & demand ischemia-infarction   Tracheostomy status   Acute on chronic respiratory failure   Discharged Condition: Stable  Hospital Course:  Mindy Mills is a 63 y.o. female initially admitted with onset of a seizure and headache.  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  She underwent coil embolization of this aneurysm.  She remained in the neuro intensive care unit for close observation.  Neurologically, she may remained relatively stable, without evidence of delayed ischemic neurologic deficit.  She did however require multiple repeat intubations after failing extubation, eventually leading to tracheostomy tube and PEG tube placement.  This was likely related to her underlying COPD, as well as possible aspiration pneumonia.  After 14 days of observation in the intensive care unit, she remained neurologically stable, tolerating trach collar, and was therefore stable for transfer to a longer term acute care hospital.  Treatments: Surgery - coil embolization of anterior communicating artery aneurysm  Discharge Exam: Blood pressure 126/65, pulse 100, temperature 99.9 F (37.7 C), temperature source Oral, resp. rate 28, height 5\' 3"  (1.6 m), weight 57.6 kg (126 lb 15.8 oz), SpO2 100.00%. Awake, alert,  Nods to questions  appropriates Breathing spontaneously on trach collar CN grossly intact 5/5 BUE/BLE with subtle RUE drift  Follow-up: Follow-up in my office [facility] [phone]) in 4 weeks  Disposition: Final discharge disposition not confirmed     Medication List         albuterol (2.5 MG/3ML) 0.083% nebulizer solution  Commonly known as:  PROVENTIL  Take 3 mLs (2.5 mg total) by nebulization every 2 (two) hours as needed for wheezing.     bisacodyl 10 MG suppository  Commonly known as:  DULCOLAX  Place 1 suppository (10 mg total) rectally daily as needed for moderate constipation.     budesonide 0.5 MG/2ML nebulizer solution  Commonly known as:  PULMICORT  Take 2 mLs (0.5 mg total) by nebulization 2 (two) times daily.     butorphanol 10 MG/ML nasal spray  Commonly known as:  STADOL  Place 1 spray into the nose every 4 (four) hours as needed for headache or migraine.     Cholecalciferol 2000 UNITS Caps  Take 2,000 Units by mouth daily.     EPIPEN 0.3 mg/0.3 mL Soaj injection  Generic drug:  EPINEPHrine  Inject 0.3 mg into the muscle once as needed (for allergic reaction).     feeding supplement (VITAL AF 1.2 CAL) Liqd  Place 1,000 mLs into feeding tube continuous.     FLUoxetine 20 MG capsule  Commonly known as:  PROZAC  Take 80 mg by mouth daily. For depression / anxiety     free water Soln  Place 250 mLs into feeding tube every 6 (six) hours.     HYDROcodone-acetaminophen 10-325 MG per tablet  Commonly known as:  NORCO  Take 1-2 tablets by mouth every 4 (four) hours as needed  for moderate pain.     ipratropium-albuterol 0.5-2.5 (3) MG/3ML Soln  Commonly known as:  DUONEB  Take 3 mLs by nebulization every 6 (six) hours.     levETIRAcetam 500 MG/100ML Soln  Commonly known as:  KEPRRA  Inject 100 mLs (500 mg total) into the vein every 12 (twelve) hours.     meloxicam 15 MG tablet  Commonly known as:  MOBIC  Take 15 mg by mouth daily as needed (for  joint pain).     NiMODipine 60 MG/20ML Soln  Commonly known as:  NYMALIZE  Place 20 mLs (60 mg total) into feeding tube every 4 (four) hours.     nortriptyline 10 MG capsule  Commonly known as:  PAMELOR  Take 10 mg by mouth daily.     oxyCODONE 15 MG immediate release tablet  Commonly known as:  ROXICODONE  Take 15 mg by mouth 3 (three) times daily as needed (for severe / breakthrough pain).     pantoprazole 40 MG injection  Commonly known as:  PROTONIX  Inject 40 mg into the vein at bedtime.     promethazine 25 MG tablet  Commonly known as:  PHENERGAN  Take 50 mg by mouth 2 (two) times daily as needed for nausea or vomiting.     traZODone 50 MG tablet  Commonly known as:  DESYREL  Take 25 mg by mouth at bedtime as needed (for sleep / anxiety).     verapamil 180 MG CR tablet  Commonly known as:  CALAN-SR  Take 180 mg by mouth daily. For migraines         Signed: Consuella Lose, C 09/24/2014, 1:23 PM

## 2014-09-24 NOTE — Progress Notes (Signed)
Placed pt. On trache collar. Pt. Tolerating well at this time.

## 2014-09-25 ENCOUNTER — Other Ambulatory Visit (HOSPITAL_COMMUNITY): Payer: Self-pay

## 2014-09-25 LAB — FERRITIN: Ferritin: 198 ng/mL (ref 10–291)

## 2014-09-25 LAB — CBC WITH DIFFERENTIAL/PLATELET
BASOS ABS: 0 10*3/uL (ref 0.0–0.1)
Basophils Relative: 0 % (ref 0–1)
Eosinophils Absolute: 0 10*3/uL (ref 0.0–0.7)
Eosinophils Relative: 0 % (ref 0–5)
HCT: 37 % (ref 36.0–46.0)
Hemoglobin: 12 g/dL (ref 12.0–15.0)
LYMPHS ABS: 0.3 10*3/uL — AB (ref 0.7–4.0)
LYMPHS PCT: 2 % — AB (ref 12–46)
MCH: 30 pg (ref 26.0–34.0)
MCHC: 32.4 g/dL (ref 30.0–36.0)
MCV: 92.5 fL (ref 78.0–100.0)
Monocytes Absolute: 0.4 10*3/uL (ref 0.1–1.0)
Monocytes Relative: 3 % (ref 3–12)
NEUTROS ABS: 17 10*3/uL — AB (ref 1.7–7.7)
Neutrophils Relative %: 95 % — ABNORMAL HIGH (ref 43–77)
Platelets: 439 10*3/uL — ABNORMAL HIGH (ref 150–400)
RBC: 4 MIL/uL (ref 3.87–5.11)
RDW: 14.8 % (ref 11.5–15.5)
WBC: 17.8 10*3/uL — AB (ref 4.0–10.5)

## 2014-09-25 LAB — LIPID PANEL
CHOLESTEROL: 231 mg/dL — AB (ref 0–200)
HDL: 39 mg/dL — ABNORMAL LOW (ref >39)
LDL CALC: 170 mg/dL — AB (ref 0–99)
Total CHOL/HDL Ratio: 5.9 RATIO
Triglycerides: 110 mg/dL (ref <150)
VLDL: 22 mg/dL (ref 0–40)

## 2014-09-25 LAB — HEMOGLOBIN A1C
Hgb A1c MFr Bld: 5.9 % — ABNORMAL HIGH (ref <5.7)
MEAN PLASMA GLUCOSE: 123 mg/dL — AB (ref <117)

## 2014-09-25 LAB — BLOOD GAS, ARTERIAL
Acid-base deficit: 0.4 mmol/L (ref 0.0–2.0)
Bicarbonate: 22.8 mEq/L (ref 20.0–24.0)
FIO2: 0.28 %
O2 SAT: 95 %
PCO2 ART: 30.5 mmHg — AB (ref 35.0–45.0)
PO2 ART: 67 mmHg — AB (ref 80.0–100.0)
Patient temperature: 97.8
TCO2: 23.7 mmol/L (ref 0–100)
pH, Arterial: 7.483 — ABNORMAL HIGH (ref 7.350–7.450)

## 2014-09-25 LAB — COMPREHENSIVE METABOLIC PANEL
ALK PHOS: 60 U/L (ref 39–117)
ALT: 19 U/L (ref 0–35)
AST: 13 U/L (ref 0–37)
Albumin: 2.8 g/dL — ABNORMAL LOW (ref 3.5–5.2)
Anion gap: 12 (ref 5–15)
BILIRUBIN TOTAL: 0.5 mg/dL (ref 0.3–1.2)
BUN: 14 mg/dL (ref 6–23)
CHLORIDE: 107 meq/L (ref 96–112)
CO2: 25 mEq/L (ref 19–32)
Calcium: 9.1 mg/dL (ref 8.4–10.5)
Creatinine, Ser: 0.34 mg/dL — ABNORMAL LOW (ref 0.50–1.10)
GFR calc Af Amer: >90 mL/min (ref >90)
Glucose, Bld: 178 mg/dL — ABNORMAL HIGH (ref 70–99)
POTASSIUM: 4 meq/L (ref 3.7–5.3)
Sodium: 144 mEq/L (ref 137–147)
Total Protein: 6.3 g/dL (ref 6.0–8.3)

## 2014-09-25 LAB — IRON AND TIBC
Iron: 24 ug/dL — ABNORMAL LOW (ref 42–135)
SATURATION RATIOS: 11 % — AB (ref 20–55)
TIBC: 227 ug/dL — ABNORMAL LOW (ref 250–470)
UIBC: 203 ug/dL (ref 125–400)

## 2014-09-25 LAB — CK TOTAL AND CKMB (NOT AT ARMC)
CK, MB: 2.3 ng/mL (ref 0.3–4.0)
RELATIVE INDEX: INVALID (ref 0.0–2.5)
Total CK: 59 U/L (ref 7–177)

## 2014-09-25 LAB — TSH: TSH: 0.119 u[IU]/mL — AB (ref 0.350–4.500)

## 2014-09-25 LAB — PROTIME-INR
INR: 1.02 (ref 0.00–1.49)
PROTHROMBIN TIME: 13.4 s (ref 11.6–15.2)

## 2014-09-25 LAB — TROPONIN I: Troponin I: <0.3 ng/mL (ref <0.30)

## 2014-09-25 LAB — VITAMIN B12: VITAMIN B 12: 482 pg/mL (ref 211–911)

## 2014-09-25 LAB — T4, FREE: Free T4: 1.27 ng/dL (ref 0.80–1.80)

## 2014-09-25 LAB — PROCALCITONIN: PROCALCITONIN: 0.13 ng/mL

## 2014-09-25 LAB — PREALBUMIN: PREALBUMIN: 22.9 mg/dL (ref 17.0–34.0)

## 2014-09-25 LAB — MAGNESIUM: Magnesium: 2 mg/dL (ref 1.5–2.5)

## 2014-09-26 LAB — CBC WITH DIFFERENTIAL/PLATELET
BASOS ABS: 0 10*3/uL (ref 0.0–0.1)
BASOS PCT: 0 % (ref 0–1)
EOS ABS: 0 10*3/uL (ref 0.0–0.7)
EOS PCT: 0 % (ref 0–5)
HCT: 37.6 % (ref 36.0–46.0)
Hemoglobin: 12.1 g/dL (ref 12.0–15.0)
Lymphocytes Relative: 2 % — ABNORMAL LOW (ref 12–46)
Lymphs Abs: 0.4 10*3/uL — ABNORMAL LOW (ref 0.7–4.0)
MCH: 29.9 pg (ref 26.0–34.0)
MCHC: 32.2 g/dL (ref 30.0–36.0)
MCV: 92.8 fL (ref 78.0–100.0)
Monocytes Absolute: 0.8 10*3/uL (ref 0.1–1.0)
Monocytes Relative: 4 % (ref 3–12)
NEUTROS PCT: 94 % — AB (ref 43–77)
Neutro Abs: 16.7 10*3/uL — ABNORMAL HIGH (ref 1.7–7.7)
PLATELETS: 468 10*3/uL — AB (ref 150–400)
RBC: 4.05 MIL/uL (ref 3.87–5.11)
RDW: 15.1 % (ref 11.5–15.5)
WBC: 17.9 10*3/uL — ABNORMAL HIGH (ref 4.0–10.5)

## 2014-09-26 LAB — BASIC METABOLIC PANEL
ANION GAP: 11 (ref 5–15)
BUN: 13 mg/dL (ref 6–23)
CALCIUM: 8.9 mg/dL (ref 8.4–10.5)
CO2: 28 mEq/L (ref 19–32)
Chloride: 105 mEq/L (ref 96–112)
Creatinine, Ser: 0.34 mg/dL — ABNORMAL LOW (ref 0.50–1.10)
GLUCOSE: 146 mg/dL — AB (ref 70–99)
POTASSIUM: 4.3 meq/L (ref 3.7–5.3)
SODIUM: 144 meq/L (ref 137–147)

## 2014-09-26 LAB — MAGNESIUM: MAGNESIUM: 1.9 mg/dL (ref 1.5–2.5)

## 2014-09-26 LAB — PHOSPHORUS: PHOSPHORUS: 3.4 mg/dL (ref 2.3–4.6)

## 2014-09-26 LAB — FOLATE RBC: RBC Folate: 569 ng/mL (ref >280)

## 2014-09-27 ENCOUNTER — Other Ambulatory Visit (HOSPITAL_COMMUNITY): Payer: Self-pay

## 2014-09-29 LAB — BASIC METABOLIC PANEL
Anion gap: 12 (ref 5–15)
BUN: 17 mg/dL (ref 6–23)
CALCIUM: 8.8 mg/dL (ref 8.4–10.5)
CO2: 27 mEq/L (ref 19–32)
Chloride: 104 mEq/L (ref 96–112)
Creatinine, Ser: 0.48 mg/dL — ABNORMAL LOW (ref 0.50–1.10)
GFR calc non Af Amer: >90 mL/min (ref >90)
GLUCOSE: 166 mg/dL — AB (ref 70–99)
Potassium: 4.3 mEq/L (ref 3.7–5.3)
SODIUM: 143 meq/L (ref 137–147)

## 2014-09-29 LAB — CBC
HCT: 37.3 % (ref 36.0–46.0)
HEMOGLOBIN: 12.2 g/dL (ref 12.0–15.0)
MCH: 30.3 pg (ref 26.0–34.0)
MCHC: 32.7 g/dL (ref 30.0–36.0)
MCV: 92.8 fL (ref 78.0–100.0)
Platelets: 411 10*3/uL — ABNORMAL HIGH (ref 150–400)
RBC: 4.02 MIL/uL (ref 3.87–5.11)
RDW: 14.5 % (ref 11.5–15.5)
WBC: 19 10*3/uL — ABNORMAL HIGH (ref 4.0–10.5)

## 2014-10-01 DIAGNOSIS — I609 Nontraumatic subarachnoid hemorrhage, unspecified: Secondary | ICD-10-CM

## 2014-10-01 DIAGNOSIS — Z930 Tracheostomy status: Secondary | ICD-10-CM

## 2014-10-01 LAB — PROCALCITONIN

## 2014-10-01 LAB — COMPREHENSIVE METABOLIC PANEL
ALK PHOS: 76 U/L (ref 39–117)
ALT: 35 U/L (ref 0–35)
ANION GAP: 11 (ref 5–15)
AST: 20 U/L (ref 0–37)
Albumin: 2.9 g/dL — ABNORMAL LOW (ref 3.5–5.2)
BUN: 18 mg/dL (ref 6–23)
CALCIUM: 9.4 mg/dL (ref 8.4–10.5)
CO2: 29 meq/L (ref 19–32)
Chloride: 102 mEq/L (ref 96–112)
Creatinine, Ser: 0.48 mg/dL — ABNORMAL LOW (ref 0.50–1.10)
GFR calc Af Amer: >90 mL/min (ref >90)
GFR calc non Af Amer: >90 mL/min (ref >90)
Glucose, Bld: 131 mg/dL — ABNORMAL HIGH (ref 70–99)
Potassium: 4.5 mEq/L (ref 3.7–5.3)
Sodium: 142 mEq/L (ref 137–147)
TOTAL PROTEIN: 7 g/dL (ref 6.0–8.3)
Total Bilirubin: 0.4 mg/dL (ref 0.3–1.2)

## 2014-10-01 LAB — CBC WITH DIFFERENTIAL/PLATELET
Basophils Absolute: 0 10*3/uL (ref 0.0–0.1)
Basophils Relative: 0 % (ref 0–1)
EOS ABS: 0 10*3/uL (ref 0.0–0.7)
Eosinophils Relative: 0 % (ref 0–5)
HEMATOCRIT: 38.9 % (ref 36.0–46.0)
Hemoglobin: 12.6 g/dL (ref 12.0–15.0)
LYMPHS ABS: 0.6 10*3/uL — AB (ref 0.7–4.0)
LYMPHS PCT: 3 % — AB (ref 12–46)
MCH: 30.3 pg (ref 26.0–34.0)
MCHC: 32.4 g/dL (ref 30.0–36.0)
MCV: 93.5 fL (ref 78.0–100.0)
MONO ABS: 0.6 10*3/uL (ref 0.1–1.0)
MONOS PCT: 3 % (ref 3–12)
Neutro Abs: 17.5 10*3/uL — ABNORMAL HIGH (ref 1.7–7.7)
Neutrophils Relative %: 94 % — ABNORMAL HIGH (ref 43–77)
Platelets: 390 10*3/uL (ref 150–400)
RBC: 4.16 MIL/uL (ref 3.87–5.11)
RDW: 14.1 % (ref 11.5–15.5)
WBC: 18.7 10*3/uL — ABNORMAL HIGH (ref 4.0–10.5)

## 2014-10-01 LAB — PREALBUMIN: Prealbumin: 27.5 mg/dL (ref 17.0–34.0)

## 2014-10-01 NOTE — Consult Note (Signed)
Name: Mindy Mills MRN: [id number] DOB: 11/18/1951    ADMISSION DATE:  09/24/2014 CONSULTATION DATE: 10/5 REFERRING MD : [facility]   BRIEF PATIENT DESCRIPTION: 63 yo post Rt ACA with SAH now with trach not requiring vent support.   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  Echo 9/16 >>EF 25-30%, apical akinesis- LAD infarction pattern.Moderate diastolic dysfunction . RVSP 50     HISTORY OF PRESENT ILLNESS:   63 y.o. F brought to [facility] ED on 9/15 after a witnessed syncopal episode followed by a grand mal seizure while in the car. Upon ED arrival, pt was unresponsive and not moving her right side. 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 She was intubated and underwent A com aneurysm coiling by Dr. Leighton Ruff. PCCM was consulted. She was reintubated 9/17 for pulmonary edema & hypoxia induced delerium  PMH - hepc, smoker, chronic headaches on narcotics  Discharged on 9/28 to [facility] with tracheostomy. PCCM consulted 10/5 for possible decannulation.    SIGNIFICANT EVENTS:  9/15 - admitted with [facility] and right ACA aneurysm, underwent endovascular repair, returned to ICU on vent.  9/15 - A com aneurysm coiled  9/16 difficulty with panda  9/18 OGT placed by GI  9/21 VDRF, BiPAP overnight, required  9/23 extubated and reintubated  9/25 trach [facility])   PAST MEDICAL HISTORY :   has a past medical history of Hypertension; Migraine; Cancer; and Hepatitis C.  has past surgical history that includes Abdominal hysterectomy; Radiology with anesthesia (N/A, 09/11/2014); and PEG placement (N/A, 09/21/2014). Prior to Admission medications   Medication Sig Start Date End Date Taking? Authorizing Provider  albuterol (PROVENTIL) (2.5 MG/3ML) 0.083% nebulizer solution Take 3 mLs (2.5 mg total) by nebulization every 2 (two) hours as needed for wheezing. 09/24/14   Consuella Lose, MD  bisacodyl (DULCOLAX) 10 MG suppository Place 1 suppository (10 mg total)  rectally daily as needed for moderate constipation. 09/24/14   Consuella Lose, MD  budesonide (PULMICORT) 0.5 MG/2ML nebulizer solution Take 2 mLs (0.5 mg total) by nebulization 2 (two) times daily. 09/24/14   Consuella Lose, MD  butorphanol (STADOL) 10 MG/ML nasal spray Place 1 spray into the nose every 4 (four) hours as needed for headache or migraine.    Historical Provider, MD  Cholecalciferol 2000 UNITS CAPS Take 2,000 Units by mouth daily.    Historical Provider, MD  EPINEPHrine (EPIPEN) 0.3 mg/0.3 mL IJ SOAJ injection Inject 0.3 mg into the muscle once as needed (for allergic reaction).    Historical Provider, MD  FLUoxetine (PROZAC) 20 MG capsule Take 80 mg by mouth daily. For depression / anxiety    Historical Provider, MD  HYDROcodone-acetaminophen (NORCO) 10-325 MG per tablet Take 1-2 tablets by mouth every 4 (four) hours as needed for moderate pain.    Historical Provider, MD  ipratropium-albuterol (DUONEB) 0.5-2.5 (3) MG/3ML SOLN Take 3 mLs by nebulization every 6 (six) hours. 09/24/14   Consuella Lose, MD  levETIRAcetam (KEPRRA) 500 MG/100ML SOLN Inject 100 mLs (500 mg total) into the vein every 12 (twelve) hours. 09/24/14   Consuella Lose, MD  meloxicam (MOBIC) 15 MG tablet Take 15 mg by mouth daily as needed (for joint pain).    Historical Provider, MD  NiMODipine (NYMALIZE) 60 MG/20ML SOLN Place 20 mLs (60 mg total) into feeding tube every 4 (four) hours. 09/24/14 10/01/14  Consuella Lose, MD  nortriptyline (PAMELOR) 10 MG capsule Take 10 mg by mouth  daily.    Historical Provider, MD  Nutritional Supplements (FEEDING SUPPLEMENT, VITAL AF 1.2 CAL,) LIQD Place 1,000 mLs into feeding tube continuous. 09/24/14   Consuella Lose, MD  oxyCODONE (ROXICODONE) 15 MG immediate release tablet Take 15 mg by mouth 3 (three) times daily as needed (for severe / breakthrough pain).    Historical Provider, MD  pantoprazole (PROTONIX) 40 MG injection Inject 40 mg into the vein at bedtime.  09/24/14   Consuella Lose, MD  promethazine (PHENERGAN) 25 MG tablet Take 50 mg by mouth 2 (two) times daily as needed for nausea or vomiting.    Historical Provider, MD  traZODone (DESYREL) 50 MG tablet Take 25 mg by mouth at bedtime as needed (for sleep / anxiety).    Historical Provider, MD  verapamil (CALAN-SR) 180 MG CR tablet Take 180 mg by mouth daily. For migraines    Historical Provider, MD  Water For Irrigation, Sterile (FREE WATER) SOLN Place 250 mLs into feeding tube every 6 (six) hours. 09/24/14   Consuella Lose, MD   Allergies  Allergen Reactions  . Bee Venom Anaphylaxis and Hives    FAMILY HISTORY:  family history is not on file. SOCIAL HISTORY:  reports that she has been smoking Cigarettes.  She has been smoking about 0.50 packs per day. She does not have any smokeless tobacco history on file. She reports that she drinks alcohol. She reports that she uses illicit drugs (Marijuana).  REVIEW OF SYSTEMS:   Vital signs reviewed. Abnormal values will appear under impression plan section.   SUBJECTIVE:  NAD VITAL SIGNS: Vital signs reviewed. Abnormal values will appear under impression plan section.   PHYSICAL EXAMINATION: General:  Awake and alert. Confused at times. Restless Neuro: Intact as to person, place , time but strange affect HEENT:  Trach with PMV and on room air Cardiovascular:  HSR RRR Lungs: CTA Abdomen:+bs Musculoskeletal: intact Skin:  Warm and dry   Recent Labs Lab 09/26/14 0600 09/29/14 0500 10/01/14 0530  NA 144 143 142  K 4.3 4.3 4.5  CL 105 104 102  CO2 28 27 29   BUN 13 17 18   CREATININE 0.34* 0.48* 0.48*  GLUCOSE 146* 166* 131*    Recent Labs Lab 09/26/14 0600 09/29/14 0500 10/01/14 0530  HGB 12.1 12.2 12.6  HCT 37.6 37.3 38.9  WBC 17.9* 19.0* 18.7*  PLT 468* 411* 390   No results found.  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 Decannulate as she has pulled trach out and had it replaced. She should tolerate trach removal.All other issues per [facility] and Neurology.  Richardson Landry Minor ACNP Maryanna Shape PCCM Pager [phone] till 3 pm If no answer page [phone] 10/01/2014, 10:58 AM  If tolerates capping of trach tube, may be decannulated  Merton Border, MD ; [facility] service Mobile [phone].  After 5:30 PM or weekends, call [phone]

## 2014-10-02 NOTE — Progress Notes (Signed)
   Name: Mindy Mills MRN: [id number] DOB: 12/09/1951    ADMISSION DATE:  09/24/2014 CONSULTATION DATE: 10/5 REFERRING MD : [facility]   BRIEF PATIENT DESCRIPTION: 63 yo post Rt ACA with SAH now with trach not requiring vent support.   STUDIES:  CTA Head 9/15 >>> large SAH, 2.5 x 96mm right ACA aneurysm as source of SAH.  Echo 9/16 >>EF 25-30%, apical akinesis- LAD infarction pattern.Moderate diastolic dysfunction . RVSP 50     HISTORY OF PRESENT ILLNESS:   63 y.o. F brought to [facility] ED on 9/15 after a witnessed syncopal episode followed by a grand mal seizure while in the car. Upon ED arrival, pt was unresponsive and not moving her right side. 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 She was intubated and underwent A com aneurysm coiling by Dr. Leighton Ruff. PCCM was consulted. She was reintubated 9/17 for pulmonary edema & hypoxia induced delerium  PMH - hepc, smoker, chronic headaches on narcotics  Discharged on 9/28 to [facility] with tracheostomy. PCCM consulted 10/5 for possible decannulation.    SIGNIFICANT EVENTS:  9/15 - admitted with [facility] and right ACA aneurysm, underwent endovascular repair, returned to ICU on vent.  9/15 - A com aneurysm coiled  9/16 difficulty with panda  9/18 OGT placed by GI  9/21 VDRF, BiPAP overnight, required  9/23 extubated and reintubated  9/25 trach (JY)>>10/6 10/6 order for decannulation written  SUBJECTIVE:  NAD VITAL SIGNS: Vital signs reviewed. Abnormal values will appear under impression plan section.   PHYSICAL EXAMINATION: General:  Awake and alert. Confused at times. Restless Neuro: Intact as to person, place , time but strange affect HEENT:  Trach capped x 24 hours Cardiovascular:  HSR RRR Lungs: CTA Abdomen:+bs Musculoskeletal: intact Skin:  Warm and dry   Recent Labs Lab 09/26/14 0600 09/29/14 0500 10/01/14 0530  NA 144 143 142  K 4.3 4.3 4.5  CL 105 104 102  CO2 28 27 29   BUN 13 17 18   CREATININE 0.34* 0.48* 0.48*    GLUCOSE 146* 166* 131*    Recent Labs Lab 09/26/14 0600 09/29/14 0500 10/01/14 0530  HGB 12.1 12.2 12.6  HCT 37.6 37.3 38.9  WBC 17.9* 19.0* 18.7*  PLT 468* 411* 390   No results found.  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 Decannulate 10/6 2. PCCM will sign off   Richardson Landry Minor ACNP Maryanna Shape PCCM Pager [phone] till 3 pm If no answer page [phone] 10/02/2014, 10:53 AM  Merton Border, MD ; [facility] service Mobile [phone].  After 5:30 PM or weekends, call [phone]

## 2014-10-04 LAB — BASIC METABOLIC PANEL
Anion gap: 15 (ref 5–15)
BUN: 19 mg/dL (ref 6–23)
CALCIUM: 9.1 mg/dL (ref 8.4–10.5)
CO2: 25 mEq/L (ref 19–32)
CREATININE: 0.65 mg/dL (ref 0.50–1.10)
Chloride: 104 mEq/L (ref 96–112)
Glucose, Bld: 100 mg/dL — ABNORMAL HIGH (ref 70–99)
Potassium: 4.2 mEq/L (ref 3.7–5.3)
Sodium: 144 mEq/L (ref 137–147)

## 2014-10-04 LAB — CBC WITH DIFFERENTIAL/PLATELET
BASOS PCT: 0 % (ref 0–1)
Basophils Absolute: 0 10*3/uL (ref 0.0–0.1)
EOS PCT: 4 % (ref 0–5)
Eosinophils Absolute: 0.5 10*3/uL (ref 0.0–0.7)
HEMATOCRIT: 45.9 % (ref 36.0–46.0)
Hemoglobin: 15 g/dL (ref 12.0–15.0)
LYMPHS PCT: 19 % (ref 12–46)
Lymphs Abs: 2.4 10*3/uL (ref 0.7–4.0)
MCH: 30.2 pg (ref 26.0–34.0)
MCHC: 32.7 g/dL (ref 30.0–36.0)
MCV: 92.4 fL (ref 78.0–100.0)
MONO ABS: 1.2 10*3/uL — AB (ref 0.1–1.0)
Monocytes Relative: 9 % (ref 3–12)
Neutro Abs: 8.9 10*3/uL — ABNORMAL HIGH (ref 1.7–7.7)
Neutrophils Relative %: 68 % (ref 43–77)
PLATELETS: 285 10*3/uL (ref 150–400)
RBC: 4.97 MIL/uL (ref 3.87–5.11)
RDW: 13.9 % (ref 11.5–15.5)
WBC: 13 10*3/uL — AB (ref 4.0–10.5)

## 2014-10-07 ENCOUNTER — Other Ambulatory Visit (HOSPITAL_COMMUNITY): Payer: Medicare Other

## 2014-10-07 LAB — PHOSPHORUS: Phosphorus: 3.4 mg/dL (ref 2.3–4.6)

## 2014-10-07 LAB — CBC WITH DIFFERENTIAL/PLATELET
BASOS ABS: 0 10*3/uL (ref 0.0–0.1)
BASOS PCT: 0 % (ref 0–1)
Eosinophils Absolute: 0.3 10*3/uL (ref 0.0–0.7)
Eosinophils Relative: 2 % (ref 0–5)
HCT: 45.2 % (ref 36.0–46.0)
Hemoglobin: 14.9 g/dL (ref 12.0–15.0)
LYMPHS PCT: 29 % (ref 12–46)
Lymphs Abs: 3.2 10*3/uL (ref 0.7–4.0)
MCH: 30.5 pg (ref 26.0–34.0)
MCHC: 33 g/dL (ref 30.0–36.0)
MCV: 92.6 fL (ref 78.0–100.0)
MONO ABS: 0.8 10*3/uL (ref 0.1–1.0)
Monocytes Relative: 7 % (ref 3–12)
NEUTROS PCT: 62 % (ref 43–77)
Neutro Abs: 6.7 10*3/uL (ref 1.7–7.7)
PLATELETS: 268 10*3/uL (ref 150–400)
RBC: 4.88 MIL/uL (ref 3.87–5.11)
RDW: 14 % (ref 11.5–15.5)
WBC: 10.9 10*3/uL — AB (ref 4.0–10.5)

## 2014-10-07 LAB — BASIC METABOLIC PANEL
ANION GAP: 10 (ref 5–15)
BUN: 18 mg/dL (ref 6–23)
CHLORIDE: 105 meq/L (ref 96–112)
CO2: 28 meq/L (ref 19–32)
CREATININE: 0.66 mg/dL (ref 0.50–1.10)
Calcium: 9.1 mg/dL (ref 8.4–10.5)
GFR calc non Af Amer: >90 mL/min (ref >90)
Glucose, Bld: 87 mg/dL (ref 70–99)
POTASSIUM: 4.2 meq/L (ref 3.7–5.3)
SODIUM: 143 meq/L (ref 137–147)

## 2014-10-07 LAB — MAGNESIUM: MAGNESIUM: 2.2 mg/dL (ref 1.5–2.5)

## 2014-10-09 LAB — BASIC METABOLIC PANEL
Anion gap: 14 (ref 5–15)
BUN: 18 mg/dL (ref 6–23)
CO2: 24 mEq/L (ref 19–32)
Calcium: 9 mg/dL (ref 8.4–10.5)
Chloride: 107 mEq/L (ref 96–112)
Creatinine, Ser: 0.63 mg/dL (ref 0.50–1.10)
GFR calc Af Amer: >90 mL/min (ref >90)
GFR calc non Af Amer: >90 mL/min (ref >90)
GLUCOSE: 94 mg/dL (ref 70–99)
Potassium: 4.6 mEq/L (ref 3.7–5.3)
Sodium: 145 mEq/L (ref 137–147)

## 2014-10-09 LAB — CBC WITH DIFFERENTIAL/PLATELET
Basophils Absolute: 0 10*3/uL (ref 0.0–0.1)
Basophils Relative: 0 % (ref 0–1)
EOS ABS: 0.2 10*3/uL (ref 0.0–0.7)
EOS PCT: 2 % (ref 0–5)
HCT: 44.6 % (ref 36.0–46.0)
Hemoglobin: 14.3 g/dL (ref 12.0–15.0)
LYMPHS PCT: 25 % (ref 12–46)
Lymphs Abs: 2.8 10*3/uL (ref 0.7–4.0)
MCH: 29.9 pg (ref 26.0–34.0)
MCHC: 32.1 g/dL (ref 30.0–36.0)
MCV: 93.1 fL (ref 78.0–100.0)
Monocytes Absolute: 1.2 10*3/uL — ABNORMAL HIGH (ref 0.1–1.0)
Monocytes Relative: 11 % (ref 3–12)
NEUTROS PCT: 62 % (ref 43–77)
Neutro Abs: 7.2 10*3/uL (ref 1.7–7.7)
PLATELETS: 217 10*3/uL (ref 150–400)
RBC: 4.79 MIL/uL (ref 3.87–5.11)
RDW: 13.9 % (ref 11.5–15.5)
WBC: 11.4 10*3/uL — AB (ref 4.0–10.5)

## 2014-10-11 LAB — CBC
HCT: 45.6 % (ref 36.0–46.0)
Hemoglobin: 14.9 g/dL (ref 12.0–15.0)
MCH: 30.2 pg (ref 26.0–34.0)
MCHC: 32.7 g/dL (ref 30.0–36.0)
MCV: 92.5 fL (ref 78.0–100.0)
Platelets: 192 10*3/uL (ref 150–400)
RBC: 4.93 MIL/uL (ref 3.87–5.11)
RDW: 13.8 % (ref 11.5–15.5)
WBC: 10.9 10*3/uL — ABNORMAL HIGH (ref 4.0–10.5)

## 2014-10-14 LAB — BASIC METABOLIC PANEL
Anion gap: 11 (ref 5–15)
BUN: 18 mg/dL (ref 6–23)
CALCIUM: 8.9 mg/dL (ref 8.4–10.5)
CO2: 29 mEq/L (ref 19–32)
CREATININE: 0.53 mg/dL (ref 0.50–1.10)
Chloride: 104 mEq/L (ref 96–112)
GFR calc non Af Amer: >90 mL/min (ref >90)
Glucose, Bld: 82 mg/dL (ref 70–99)
Potassium: 4.2 mEq/L (ref 3.7–5.3)
Sodium: 144 mEq/L (ref 137–147)

## 2014-10-14 LAB — CBC
HEMATOCRIT: 44.1 % (ref 36.0–46.0)
Hemoglobin: 13.7 g/dL (ref 12.0–15.0)
MCH: 29.1 pg (ref 26.0–34.0)
MCHC: 31.1 g/dL (ref 30.0–36.0)
MCV: 93.8 fL (ref 78.0–100.0)
Platelets: 199 10*3/uL (ref 150–400)
RBC: 4.7 MIL/uL (ref 3.87–5.11)
RDW: 13.9 % (ref 11.5–15.5)
WBC: 11 10*3/uL — ABNORMAL HIGH (ref 4.0–10.5)

## 2015-08-08 IMAGING — CR DG CHEST 1V PORT
1 series · 1 of 1 positions shown · non-contrast
Comparison: 09/16/2014

CLINICAL DATA: Pneumonitis

EXAM:
PORTABLE CHEST - 1 VIEW

[AP]
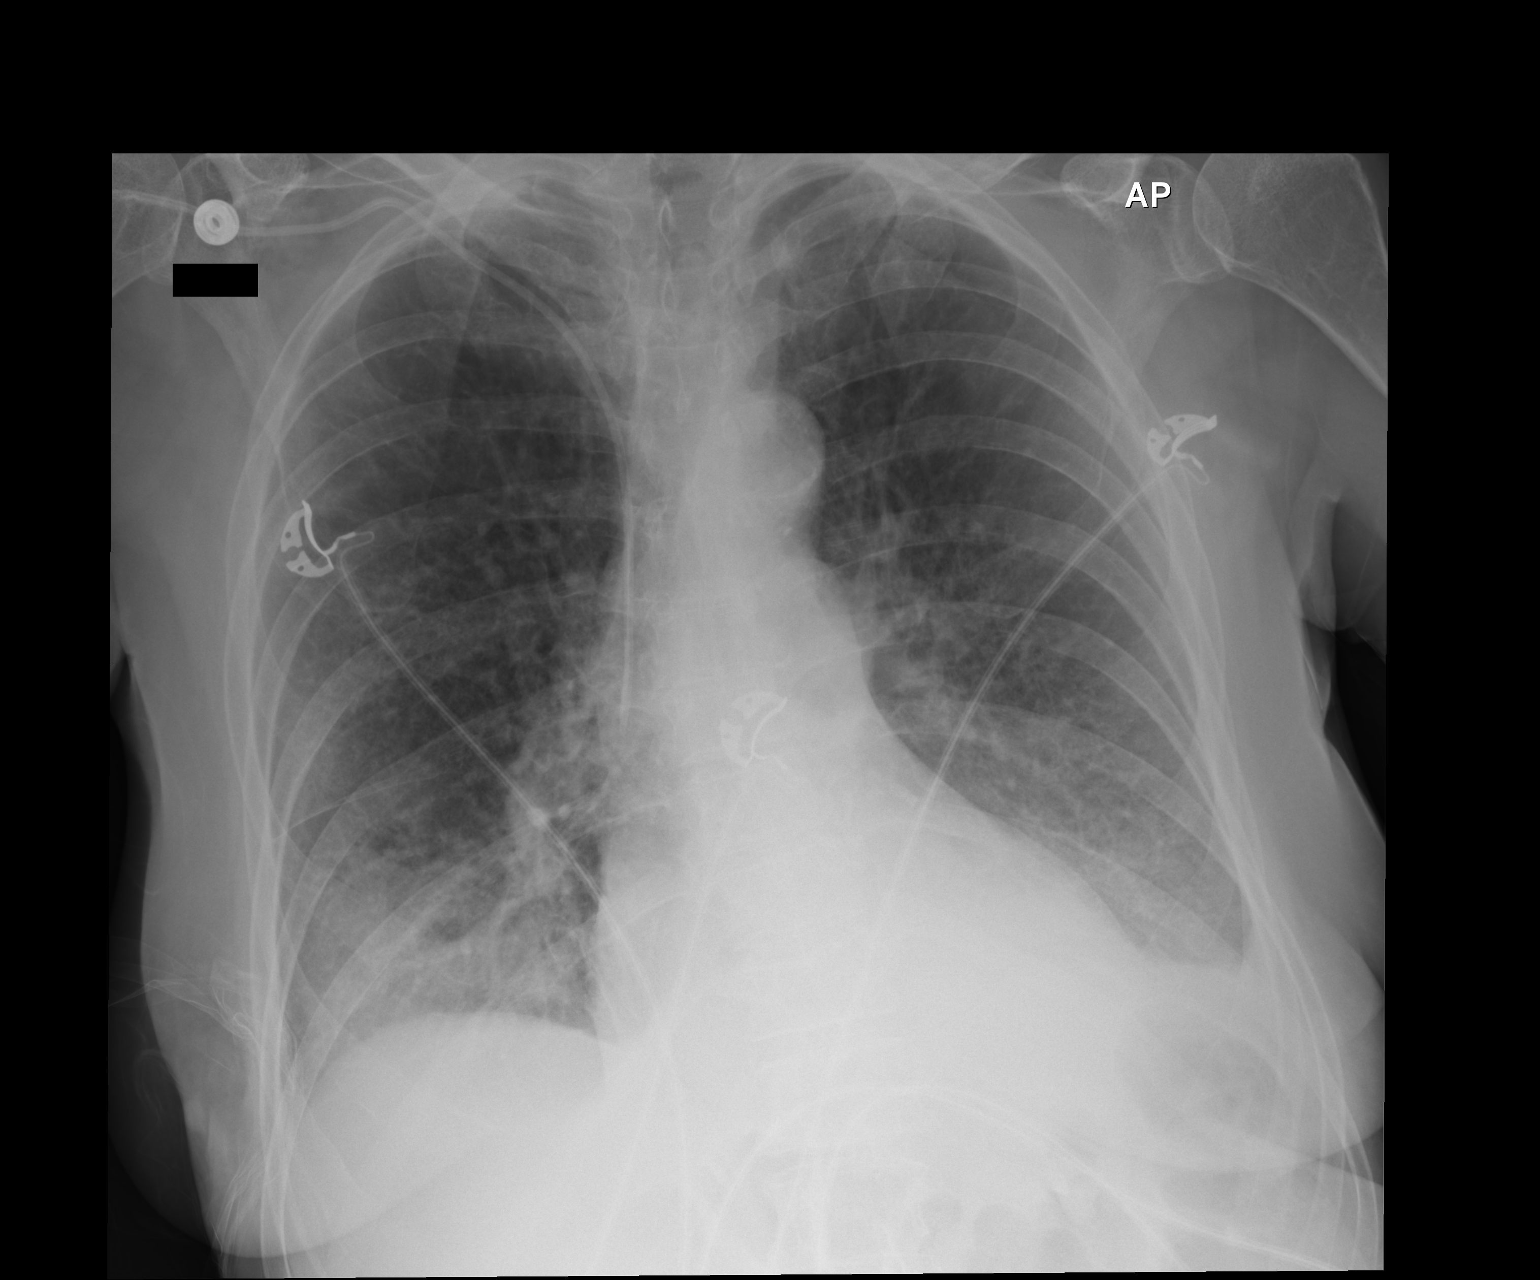

[1 of 1 positions shown; findings below may reference images not displayed]

FINDINGS: A right central venous line is again identified and stable
appearance. The cardiac shadow is stable. The degree of vascular
congestion is improve slightly in the interval. Persistent bibasilar
densities are seen. Portion of this is related to pleural effusion.
No pneumothorax is noted.
IMPRESSION: Slight decrease in the degree of vascular congestion. Persistent
pleural effusions are seen.

## 2015-08-09 IMAGING — CR DG CHEST 1V PORT
1 series · 1 of 1 positions shown · non-contrast
Comparison: 09/17/2014

CLINICAL DATA: Acute respiratory failure.

EXAM:
PORTABLE CHEST - 1 VIEW

[AP]
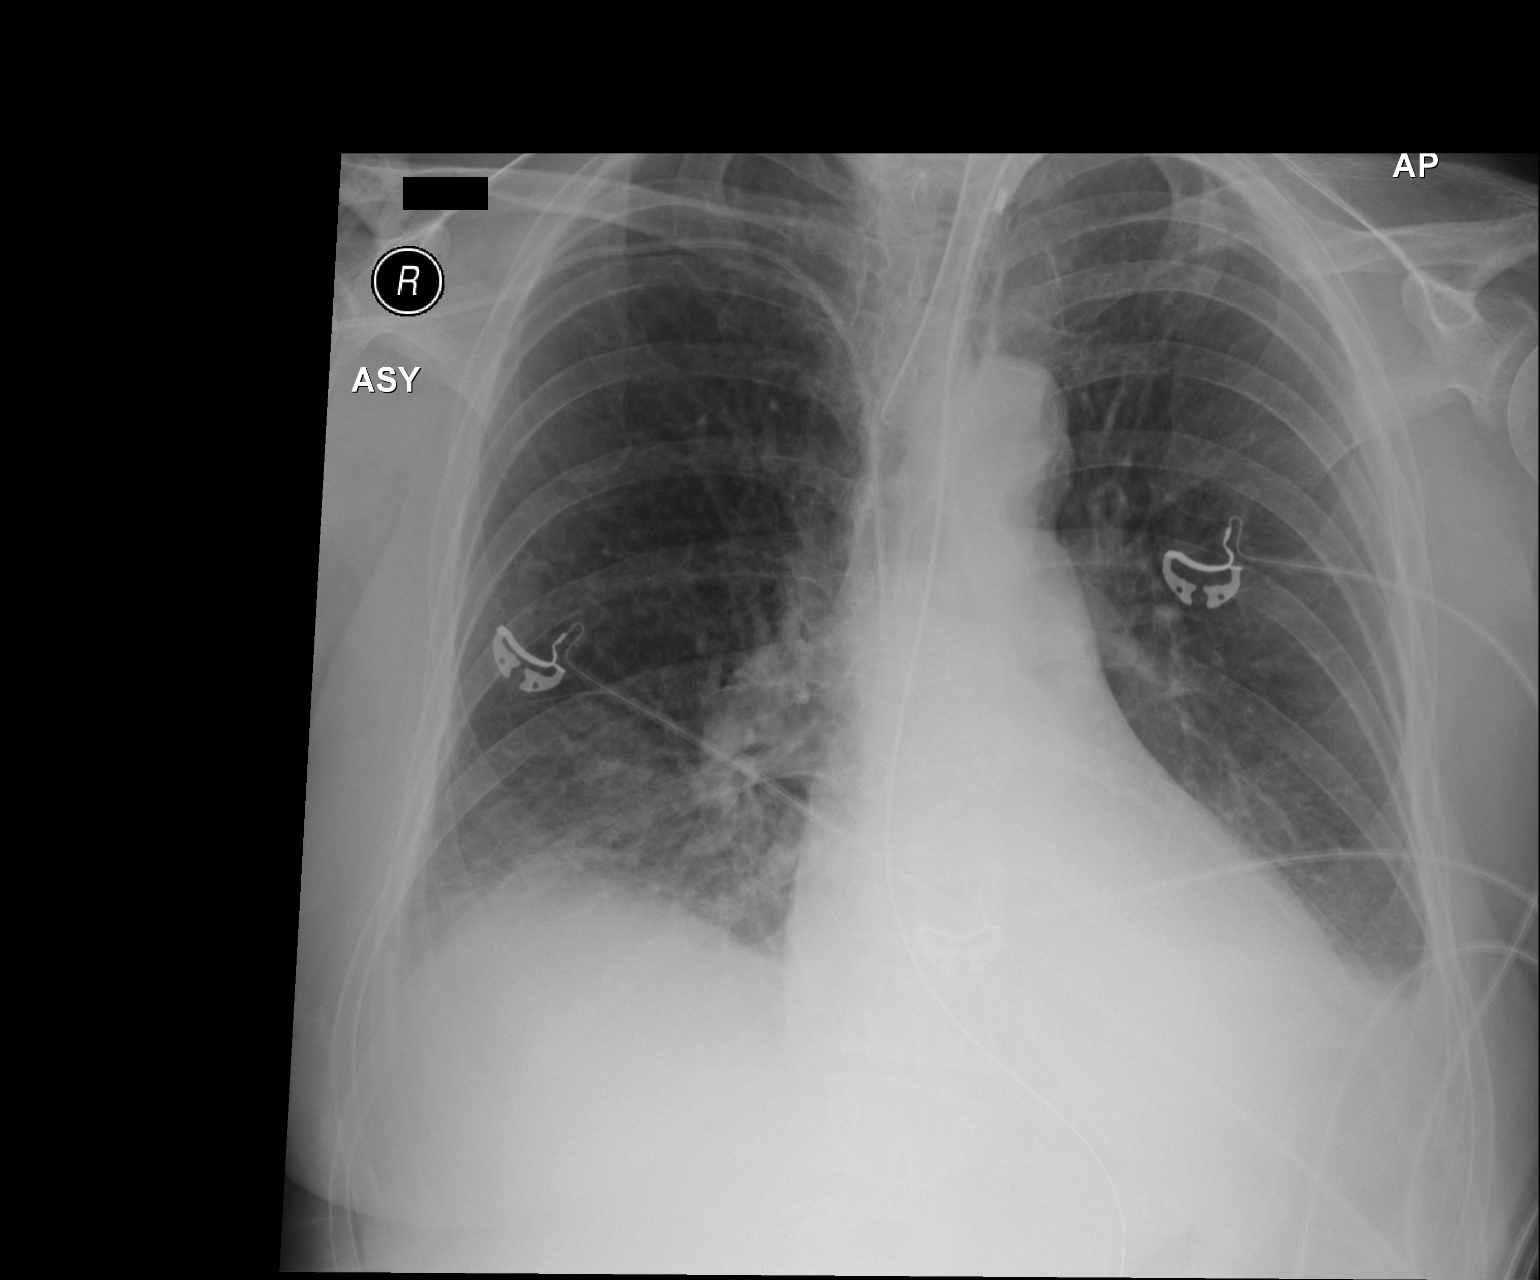

[1 of 1 positions shown; findings below may reference images not displayed]

FINDINGS: Endotracheal tube, central catheter, and NG tube appear in good
position. Heart size and vascularity are normal. Interstitial
accentuation has resolved. Small bilateral pleural effusions,
essentially unchanged.
IMPRESSION: Interstitial accentuation seen on the prior study which may have
represented slight pulmonary edema has resolved. Small bilateral
pleural effusions.

## 2015-08-10 IMAGING — CR DG CHEST 1V PORT
1 series · 1 of 1 positions shown · non-contrast
Comparison: 09/19/2014.  09/16/2014.

CLINICAL DATA: Intubation.

EXAM:
PORTABLE CHEST - 1 VIEW

[AP]
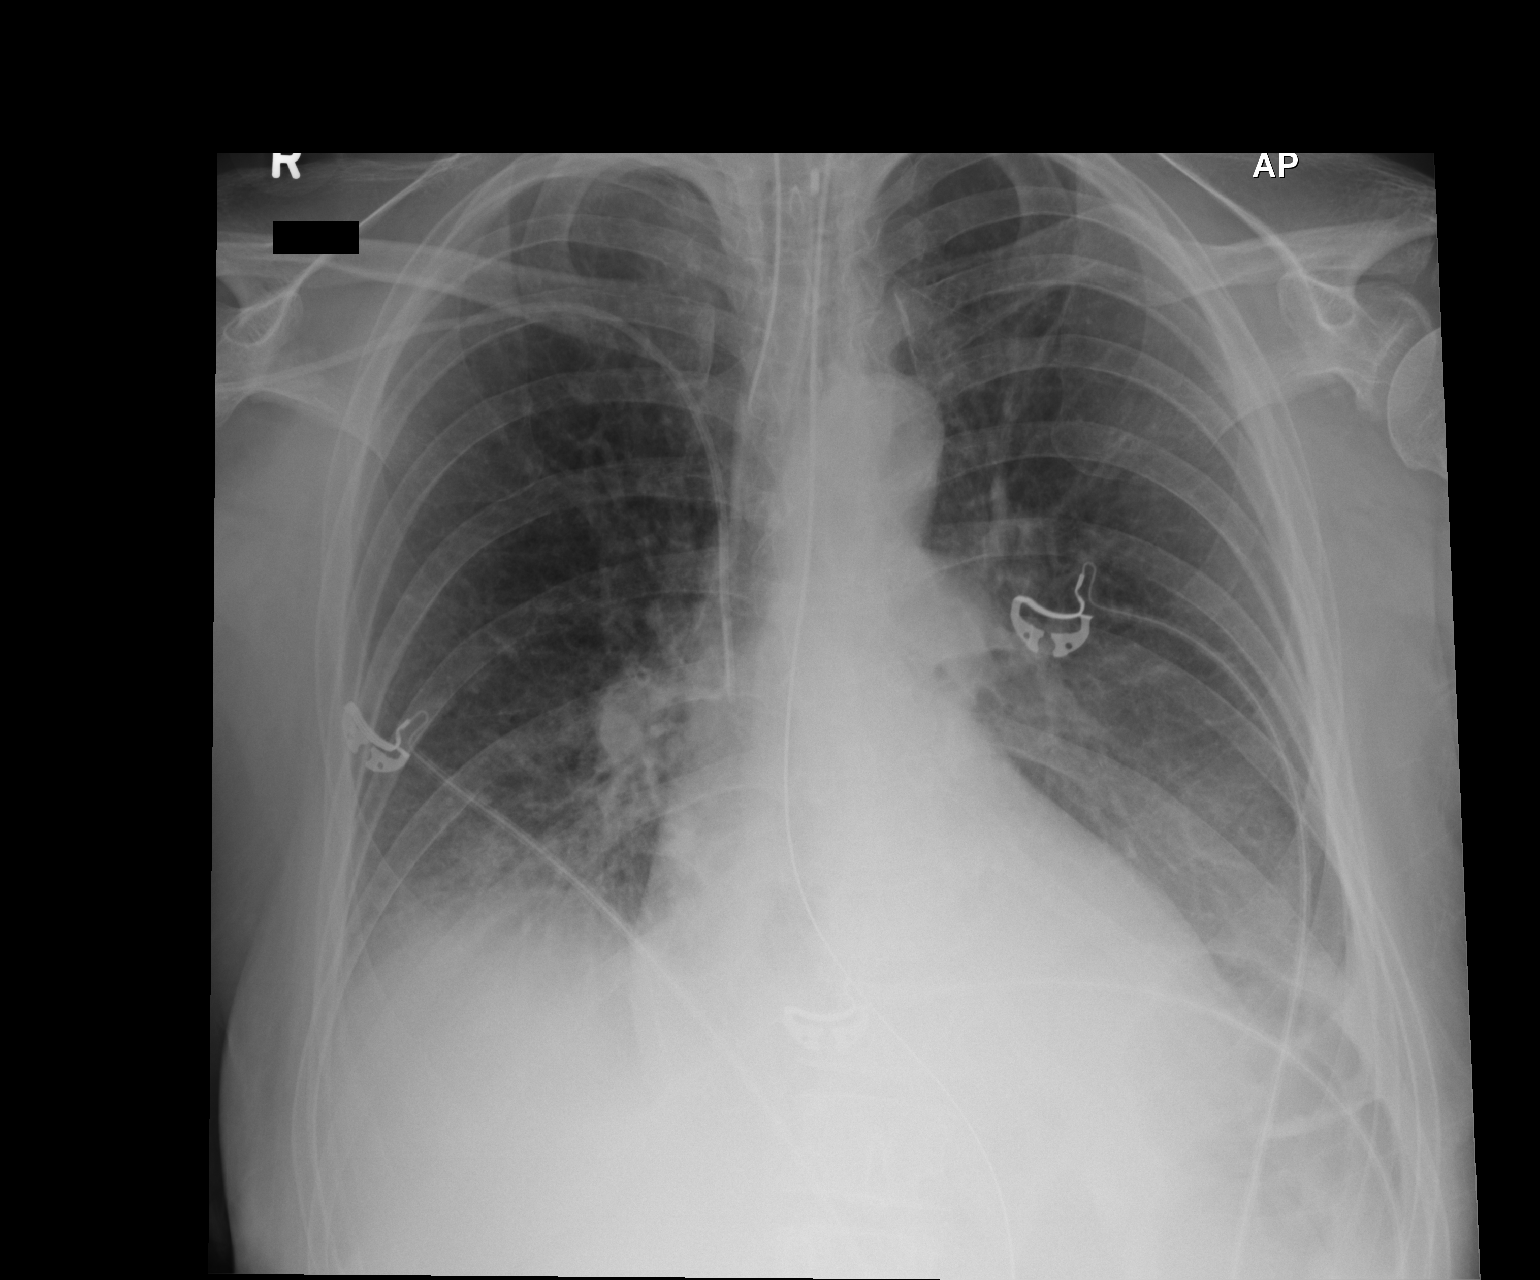

[1 of 1 positions shown; findings below may reference images not displayed]

FINDINGS: Endotracheal tube, NG tube, right PICC are in stable position. Small
bilateral pulmonary edema and small pleural effusions. Stable
cardiomegaly. Note pneumothorax. Old left clavicular fracture.
IMPRESSION: 1.  Lines and tubes in stable position.

2.  Mild bibasilar pulmonary edema and small pleural effusions.

## 2015-08-11 IMAGING — CR DG CHEST 1V PORT
1 series · 1 of 1 positions shown · non-contrast
Comparison: September 19, 2014

CLINICAL DATA: Hypoxia

EXAM:
PORTABLE CHEST - 1 VIEW

[AP]
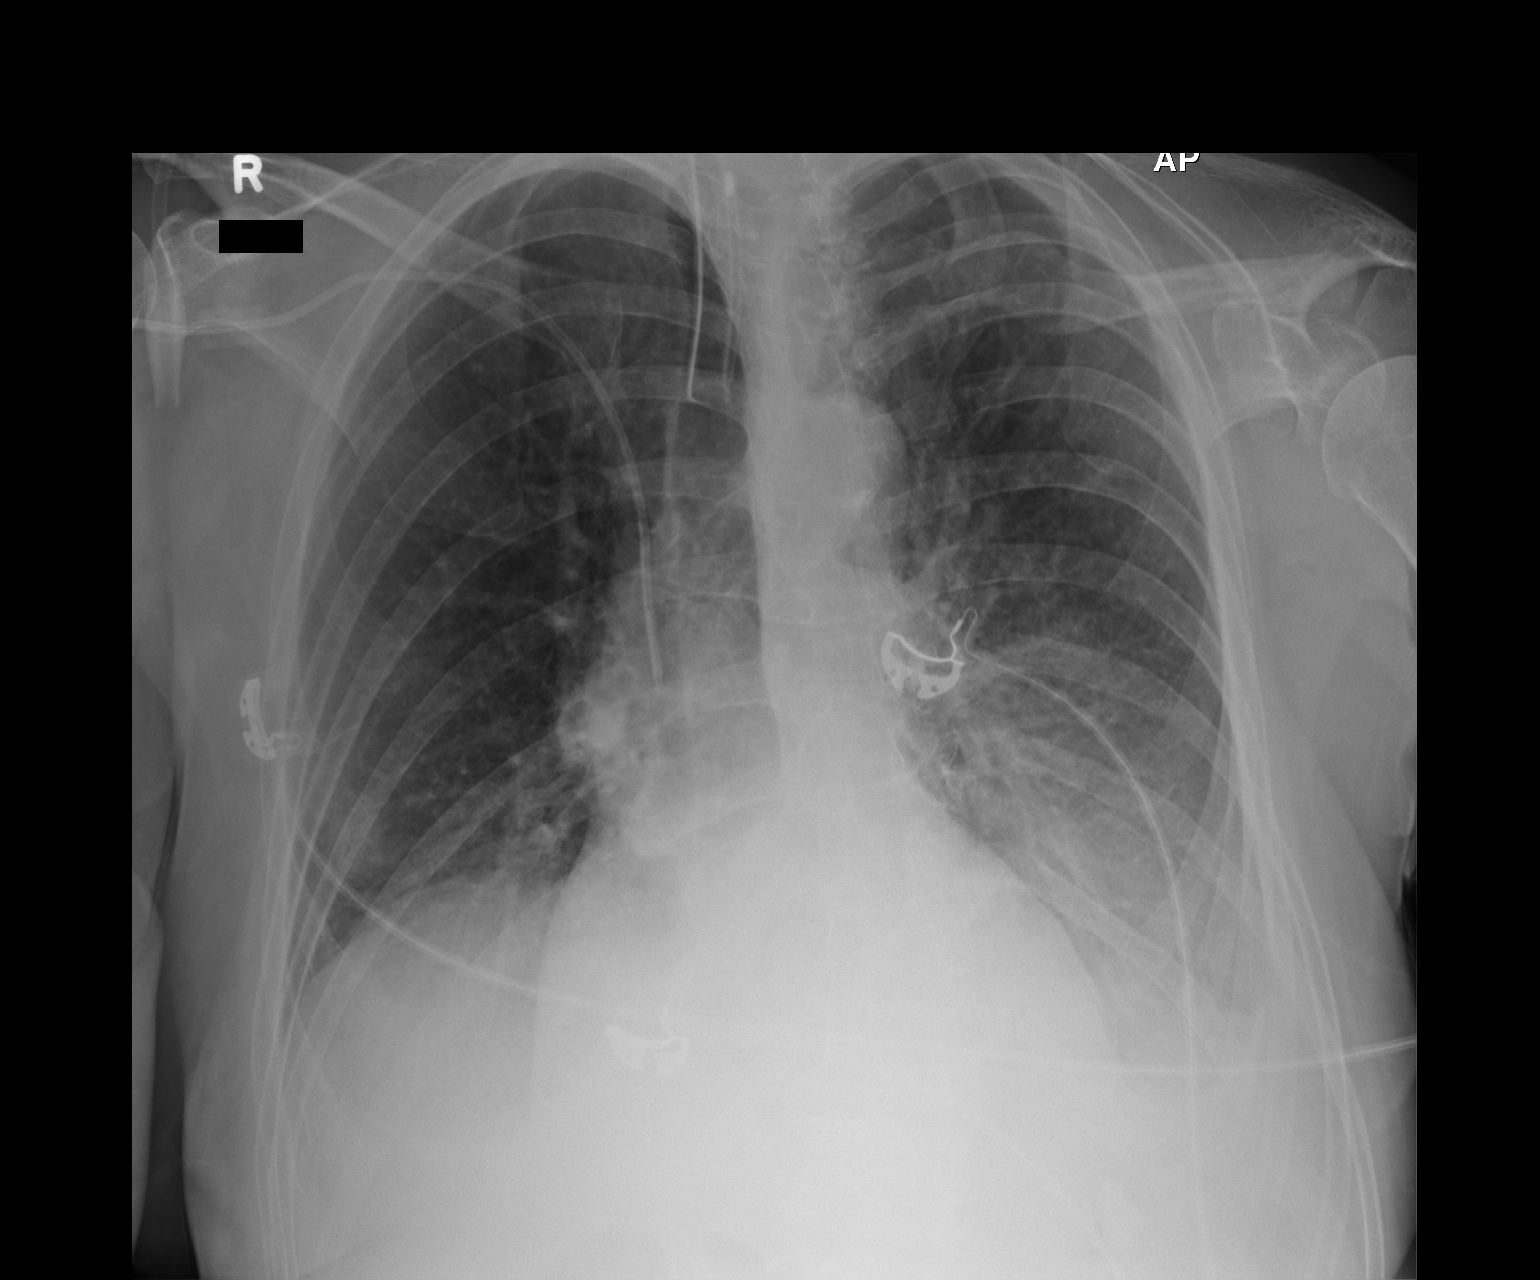

[1 of 1 positions shown; findings below may reference images not displayed]

FINDINGS: Endotracheal tube tip is 4.6 cm above the carina. Central catheter
tip is in the superior vena cava. No apparent pneumothorax. There is
left effusion with left base atelectasis. Lungs elsewhere clear.
Heart is upper normal in size with pulmonary vascularity within
normal limits. No adenopathy.
IMPRESSION: Tube and catheter positions as described without pneumothorax. Left
effusion with left base atelectatic change. Right lung clear.

## 2015-08-12 IMAGING — CR DG CHEST 1V PORT
1 series · 1 of 1 positions shown · non-contrast
Comparison: Single view of the chest 09/20/2014 and 09/19/2014.

CLINICAL DATA: Syncope, seizure.  Subarachnoid hemorrhage.

EXAM:
PORTABLE CHEST - 1 VIEW

[AP]
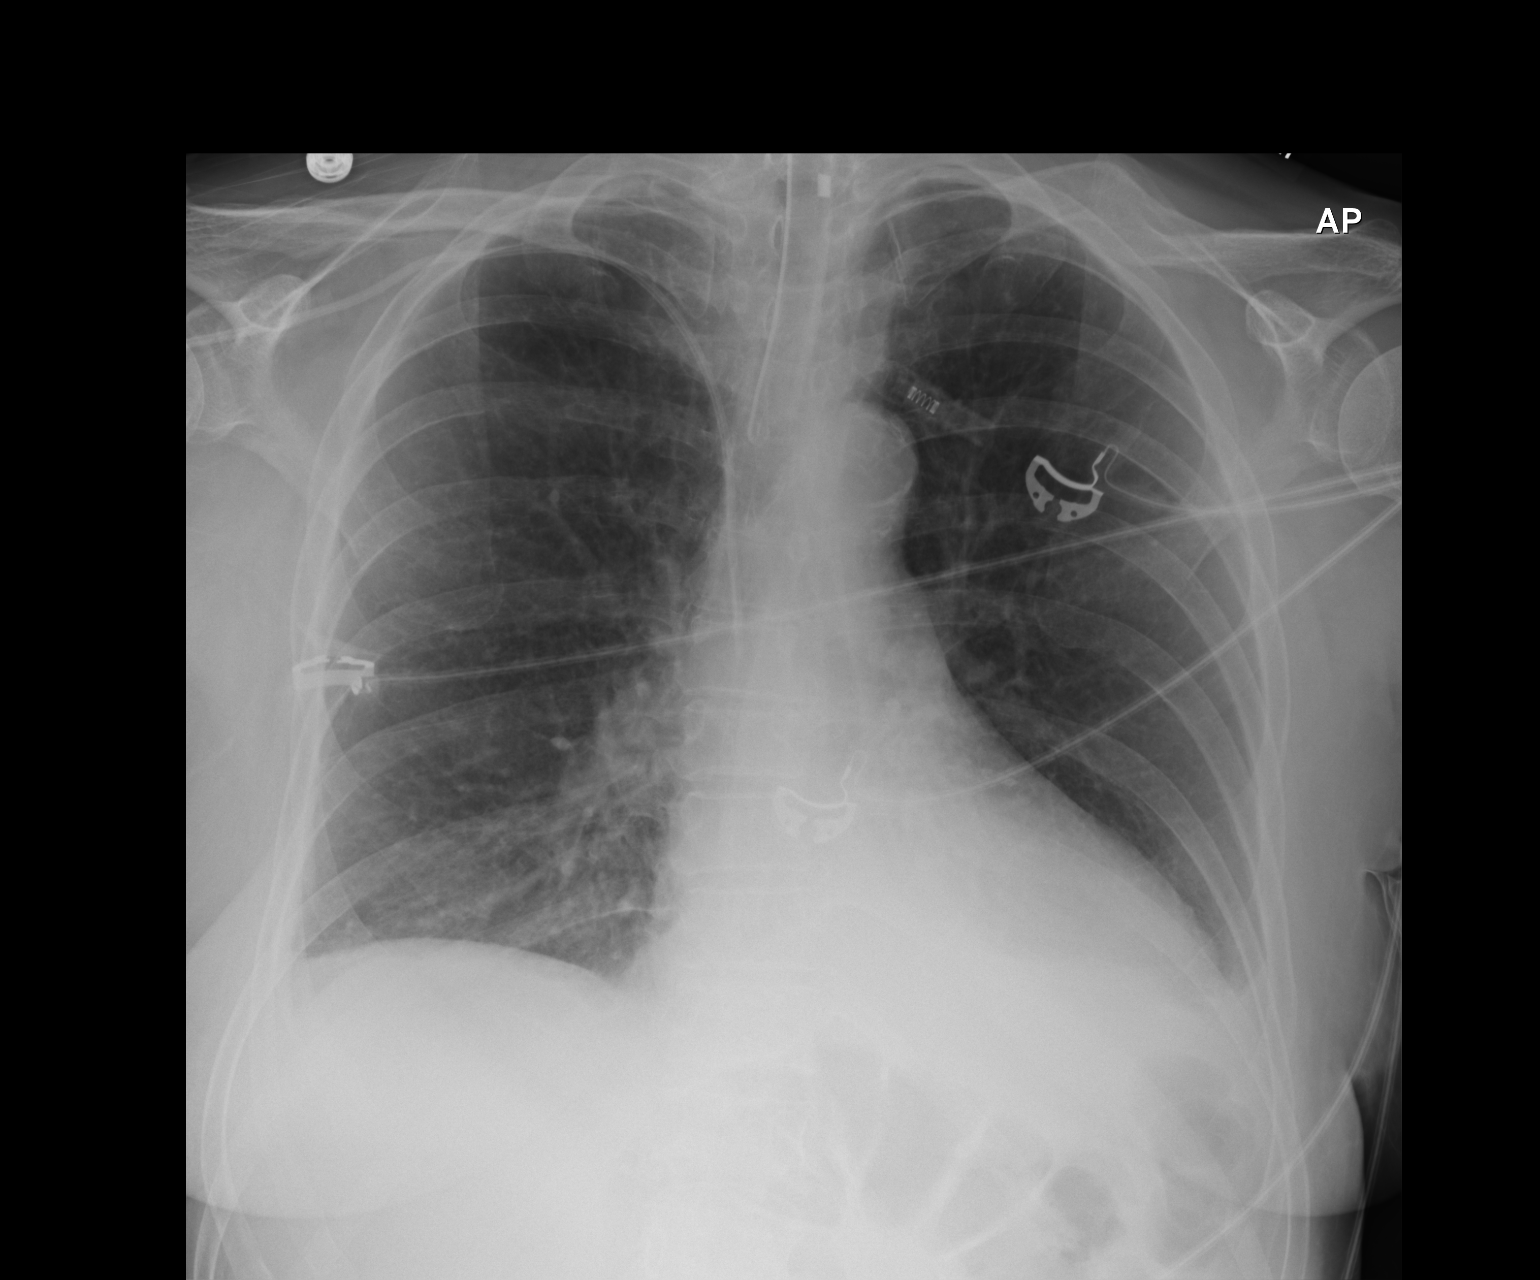

[1 of 1 positions shown; findings below may reference images not displayed]

FINDINGS: Endotracheal tube and right subclavian catheter remain in place.
There is a trace right pleural effusion. Small left pleural effusion
and basilar atelectasis have decreased. No pneumothorax. Heart size
is normal.
IMPRESSION: Decrease left pleural effusion. No other change. Decreased left
pleural effusion and basilar atelectasis.

## 2015-09-08 ENCOUNTER — Emergency Department (HOSPITAL_COMMUNITY): Payer: Medicare Other

## 2015-09-08 ENCOUNTER — Encounter (HOSPITAL_COMMUNITY): Payer: Self-pay

## 2015-09-08 ENCOUNTER — Inpatient Hospital Stay (HOSPITAL_COMMUNITY)
Admission: EM | Admit: 2015-09-08 | Discharge: 2015-09-11 | DRG: 872 | Disposition: A | Discharge status: Skilled Nursing Facility | Payer: Medicare Other | Attending: Internal Medicine | Admitting: Internal Medicine

## 2015-09-08 ENCOUNTER — Inpatient Hospital Stay (HOSPITAL_COMMUNITY): Payer: Medicare Other

## 2015-09-08 DIAGNOSIS — W19XXXA Unspecified fall, initial encounter: Secondary | ICD-10-CM | POA: Diagnosis not present

## 2015-09-08 DIAGNOSIS — R410 Disorientation, unspecified: Secondary | ICD-10-CM | POA: Diagnosis not present

## 2015-09-08 DIAGNOSIS — Z7952 Long term (current) use of systemic steroids: Secondary | ICD-10-CM | POA: Diagnosis not present

## 2015-09-08 DIAGNOSIS — Z8673 Personal history of transient ischemic attack (TIA), and cerebral infarction without residual deficits: Secondary | ICD-10-CM | POA: Diagnosis not present

## 2015-09-08 DIAGNOSIS — R29810 Facial weakness: Secondary | ICD-10-CM | POA: Diagnosis present

## 2015-09-08 DIAGNOSIS — Z7982 Long term (current) use of aspirin: Secondary | ICD-10-CM | POA: Diagnosis not present

## 2015-09-08 DIAGNOSIS — E860 Dehydration: Secondary | ICD-10-CM | POA: Diagnosis not present

## 2015-09-08 DIAGNOSIS — E785 Hyperlipidemia, unspecified: Secondary | ICD-10-CM | POA: Diagnosis not present

## 2015-09-08 DIAGNOSIS — Z87891 Personal history of nicotine dependence: Secondary | ICD-10-CM

## 2015-09-08 DIAGNOSIS — N390 Urinary tract infection, site not specified: Secondary | ICD-10-CM | POA: Diagnosis not present

## 2015-09-08 DIAGNOSIS — I5022 Chronic systolic (congestive) heart failure: Secondary | ICD-10-CM | POA: Diagnosis not present

## 2015-09-08 DIAGNOSIS — Z79899 Other long term (current) drug therapy: Secondary | ICD-10-CM | POA: Diagnosis not present

## 2015-09-08 DIAGNOSIS — A419 Sepsis, unspecified organism: Secondary | ICD-10-CM | POA: Diagnosis not present

## 2015-09-08 DIAGNOSIS — F419 Anxiety disorder, unspecified: Secondary | ICD-10-CM | POA: Diagnosis present

## 2015-09-08 DIAGNOSIS — B1920 Unspecified viral hepatitis C without hepatic coma: Secondary | ICD-10-CM | POA: Diagnosis present

## 2015-09-08 DIAGNOSIS — G459 Transient cerebral ischemic attack, unspecified: Secondary | ICD-10-CM | POA: Diagnosis present

## 2015-09-08 DIAGNOSIS — I5020 Unspecified systolic (congestive) heart failure: Secondary | ICD-10-CM | POA: Diagnosis present

## 2015-09-08 DIAGNOSIS — Z7951 Long term (current) use of inhaled steroids: Secondary | ICD-10-CM

## 2015-09-08 DIAGNOSIS — R4182 Altered mental status, unspecified: Secondary | ICD-10-CM | POA: Diagnosis not present

## 2015-09-08 DIAGNOSIS — F329 Major depressive disorder, single episode, unspecified: Secondary | ICD-10-CM | POA: Diagnosis present

## 2015-09-08 DIAGNOSIS — Z91030 Bee allergy status: Secondary | ICD-10-CM | POA: Diagnosis not present

## 2015-09-08 DIAGNOSIS — G43909 Migraine, unspecified, not intractable, without status migrainosus: Secondary | ICD-10-CM | POA: Diagnosis not present

## 2015-09-08 DIAGNOSIS — G9340 Encephalopathy, unspecified: Secondary | ICD-10-CM | POA: Diagnosis not present

## 2015-09-08 DIAGNOSIS — I10 Essential (primary) hypertension: Secondary | ICD-10-CM | POA: Diagnosis not present

## 2015-09-08 DIAGNOSIS — Z8541 Personal history of malignant neoplasm of cervix uteri: Secondary | ICD-10-CM

## 2015-09-08 HISTORY — DX: Cerebral infarction, unspecified: I63.9

## 2015-09-08 LAB — COMPREHENSIVE METABOLIC PANEL
ALT: 21 U/L (ref 14–54)
AST: 21 U/L (ref 15–41)
Albumin: 3.7 g/dL (ref 3.5–5.0)
Alkaline Phosphatase: 86 U/L (ref 38–126)
Anion gap: 11 (ref 5–15)
BILIRUBIN TOTAL: 0.6 mg/dL (ref 0.3–1.2)
BUN: 16 mg/dL (ref 6–20)
CHLORIDE: 108 mmol/L (ref 101–111)
CO2: 21 mmol/L — ABNORMAL LOW (ref 22–32)
Calcium: 8.9 mg/dL (ref 8.9–10.3)
Creatinine, Ser: 1.01 mg/dL — ABNORMAL HIGH (ref 0.44–1.00)
GFR calc Af Amer: >60 mL/min (ref >60)
GFR calc non Af Amer: 58 mL/min — ABNORMAL LOW (ref >60)
GLUCOSE: 106 mg/dL — AB (ref 65–99)
POTASSIUM: 4 mmol/L (ref 3.5–5.1)
Sodium: 140 mmol/L (ref 135–145)
TOTAL PROTEIN: 6.9 g/dL (ref 6.5–8.1)

## 2015-09-08 LAB — DIFFERENTIAL
Basophils Absolute: 0 10*3/uL (ref 0.0–0.1)
Basophils Relative: 0 % (ref 0–1)
Eosinophils Absolute: 0 10*3/uL (ref 0.0–0.7)
Eosinophils Relative: 0 % (ref 0–5)
Lymphocytes Relative: 8 % — ABNORMAL LOW (ref 12–46)
Lymphs Abs: 1.7 10*3/uL (ref 0.7–4.0)
MONO ABS: 1.3 10*3/uL — AB (ref 0.1–1.0)
Monocytes Relative: 6 % (ref 3–12)
Neutro Abs: 17.9 10*3/uL — ABNORMAL HIGH (ref 1.7–7.7)
Neutrophils Relative %: 86 % — ABNORMAL HIGH (ref 43–77)

## 2015-09-08 LAB — I-STAT CHEM 8, ED
BUN: 19 mg/dL (ref 6–20)
CHLORIDE: 108 mmol/L (ref 101–111)
Calcium, Ion: 1.15 mmol/L (ref 1.13–1.30)
Creatinine, Ser: 1 mg/dL (ref 0.44–1.00)
Glucose, Bld: 107 mg/dL — ABNORMAL HIGH (ref 65–99)
HCT: 47 % — ABNORMAL HIGH (ref 36.0–46.0)
Hemoglobin: 16 g/dL — ABNORMAL HIGH (ref 12.0–15.0)
POTASSIUM: 4.1 mmol/L (ref 3.5–5.1)
SODIUM: 143 mmol/L (ref 135–145)
TCO2: 21 mmol/L (ref 0–100)

## 2015-09-08 LAB — CBC
HCT: 45.1 % (ref 36.0–46.0)
HEMOGLOBIN: 15.1 g/dL — AB (ref 12.0–15.0)
MCH: 29.7 pg (ref 26.0–34.0)
MCHC: 33.5 g/dL (ref 30.0–36.0)
MCV: 88.6 fL (ref 78.0–100.0)
Platelets: 222 10*3/uL (ref 150–400)
RBC: 5.09 MIL/uL (ref 3.87–5.11)
RDW: 13.2 % (ref 11.5–15.5)
WBC: 20.9 10*3/uL — ABNORMAL HIGH (ref 4.0–10.5)

## 2015-09-08 LAB — URINALYSIS, ROUTINE W REFLEX MICROSCOPIC
Bilirubin Urine: NEGATIVE
Glucose, UA: NEGATIVE mg/dL
Ketones, ur: NEGATIVE mg/dL
NITRITE: NEGATIVE
PROTEIN: 30 mg/dL — AB
Specific Gravity, Urine: 1.038 — ABNORMAL HIGH (ref 1.005–1.030)
UROBILINOGEN UA: 1 mg/dL (ref 0.0–1.0)
pH: 6.5 (ref 5.0–8.0)

## 2015-09-08 LAB — I-STAT CG4 LACTIC ACID, ED: Lactic Acid, Venous: 1.29 mmol/L (ref 0.5–2.0)

## 2015-09-08 LAB — I-STAT TROPONIN, ED: Troponin i, poc: 0 ng/mL (ref 0.00–0.08)

## 2015-09-08 LAB — URINE MICROSCOPIC-ADD ON

## 2015-09-08 LAB — CBG MONITORING, ED: Glucose-Capillary: 115 mg/dL — ABNORMAL HIGH (ref 65–99)

## 2015-09-08 LAB — APTT: APTT: 28 s (ref 24–37)

## 2015-09-08 LAB — PROTIME-INR
INR: 1 (ref 0.00–1.49)
Prothrombin Time: 13.4 seconds (ref 11.6–15.2)

## 2015-09-08 MED ORDER — ASPIRIN 300 MG RE SUPP: 300.0000 mg | Freq: Every day | RECTAL | Status: DC

## 2015-09-08 MED ORDER — STROKE: EARLY STAGES OF RECOVERY BOOK
Freq: Once | Status: AC
  Administered 2015-09-08: 23:00:00
  Filled 2015-09-08: qty 1

## 2015-09-08 MED ORDER — ACETAMINOPHEN 650 MG RE SUPP: 650.0000 mg | RECTAL | Status: DC | PRN

## 2015-09-08 MED ORDER — SODIUM CHLORIDE 0.9 % IV BOLUS (SEPSIS)
1000.0000 mL | Freq: Once | INTRAVENOUS | Status: AC
  Administered 2015-09-08: 1000 mL via INTRAVENOUS

## 2015-09-08 MED ORDER — PANTOPRAZOLE SODIUM 40 MG IV SOLR
40.0000 mg | Freq: Every day | INTRAVENOUS | Status: DC
  Administered 2015-09-09: 40 mg via INTRAVENOUS
  Filled 2015-09-08: qty 40

## 2015-09-08 MED ORDER — VENLAFAXINE HCL ER 75 MG PO CP24
225.0000 mg | ORAL_CAPSULE | Freq: Every day | ORAL | Status: DC
  Administered 2015-09-09 – 2015-09-11 (×3): 225 mg via ORAL
  Filled 2015-09-08 (×3): qty 3

## 2015-09-08 MED ORDER — DEXTROSE 5 % IV SOLN
1.0000 g | Freq: Once | INTRAVENOUS | Status: AC
  Administered 2015-09-08: 1 g via INTRAVENOUS
  Filled 2015-09-08: qty 10

## 2015-09-08 MED ORDER — LEVETIRACETAM 500 MG PO TABS
500.0000 mg | ORAL_TABLET | Freq: Three times a day (TID) | ORAL | Status: DC
  Administered 2015-09-09 – 2015-09-11 (×8): 500 mg via ORAL
  Filled 2015-09-08 (×8): qty 1

## 2015-09-08 MED ORDER — SENNOSIDES-DOCUSATE SODIUM 8.6-50 MG PO TABS: 1.0000 | ORAL_TABLET | Freq: Every evening | ORAL | Status: DC | PRN

## 2015-09-08 MED ORDER — IOHEXOL 300 MG/ML  SOLN
100.0000 mL | Freq: Once | INTRAMUSCULAR | Status: AC | PRN
  Administered 2015-09-08: 100 mL via INTRAVENOUS

## 2015-09-08 MED ORDER — ACETAMINOPHEN 325 MG PO TABS
650.0000 mg | ORAL_TABLET | ORAL | Status: DC | PRN
  Filled 2015-09-08: qty 2

## 2015-09-08 MED ORDER — DEXTROSE 5 % IV SOLN
1.0000 g | INTRAVENOUS | Status: DC
  Administered 2015-09-09 – 2015-09-10 (×2): 1 g via INTRAVENOUS
  Filled 2015-09-08 (×3): qty 10

## 2015-09-08 MED ORDER — TRAZODONE HCL 50 MG PO TABS
50.0000 mg | ORAL_TABLET | Freq: Every day | ORAL | Status: DC
  Administered 2015-09-09 – 2015-09-10 (×3): 50 mg via ORAL
  Filled 2015-09-08 (×3): qty 1

## 2015-09-08 MED ORDER — ASPIRIN EC 325 MG PO TBEC: 325.0000 mg | DELAYED_RELEASE_TABLET | Freq: Every day | ORAL | Status: DC

## 2015-09-08 MED ORDER — PREDNISONE 5 MG PO TABS
10.0000 mg | ORAL_TABLET | Freq: Every day | ORAL | Status: DC
  Administered 2015-09-09 – 2015-09-11 (×3): 10 mg via ORAL
  Filled 2015-09-08 (×3): qty 2

## 2015-09-08 MED ORDER — CLONAZEPAM 0.5 MG PO TABS
0.5000 mg | ORAL_TABLET | Freq: Two times a day (BID) | ORAL | Status: DC
  Administered 2015-09-09 – 2015-09-11 (×6): 0.5 mg via ORAL
  Filled 2015-09-08 (×6): qty 1

## 2015-09-08 MED ORDER — ATORVASTATIN CALCIUM 20 MG PO TABS
20.0000 mg | ORAL_TABLET | Freq: Every day | ORAL | Status: DC
  Administered 2015-09-09 – 2015-09-10 (×2): 20 mg via ORAL
  Filled 2015-09-08 (×2): qty 1
  Filled 2015-09-08: qty 2
  Filled 2015-09-08: qty 1
  Filled 2015-09-08: qty 2

## 2015-09-08 MED ORDER — SODIUM CHLORIDE 0.9 % IV SOLN
INTRAVENOUS | Status: DC
  Administered 2015-09-09: 01:00:00 via INTRAVENOUS

## 2015-09-08 MED ORDER — TOPIRAMATE 25 MG PO TABS
50.0000 mg | ORAL_TABLET | Freq: Every day | ORAL | Status: DC
  Administered 2015-09-09 – 2015-09-10 (×3): 50 mg via ORAL
  Filled 2015-09-08 (×3): qty 2

## 2015-09-08 MED ORDER — ASPIRIN 325 MG PO TABS
325.0000 mg | ORAL_TABLET | Freq: Every day | ORAL | Status: DC
  Administered 2015-09-09 – 2015-09-11 (×3): 325 mg via ORAL
  Filled 2015-09-08 (×3): qty 1

## 2015-09-08 MED ORDER — MIRTAZAPINE 15 MG PO TABS
15.0000 mg | ORAL_TABLET | Freq: Every day | ORAL | Status: DC
  Administered 2015-09-09 – 2015-09-10 (×3): 15 mg via ORAL
  Filled 2015-09-08 (×3): qty 1

## 2015-09-08 NOTE — ED Notes (Signed)
Pt here with son who reports pt was discharged from [facility] 9 days ago after ischemic stroke.  In the past 24 hours son says she is having more memory gaps, confusion, shuffling of her feet when walking.  Pt's deficits from previous strokes include: short term memory gaps, crossing feet when walking , left pedal pressure less than right, strength did not return fully.

## 2015-09-08 NOTE — ED Provider Notes (Signed)
CSN: [id number]     Arrival date & time 09/08/15  1513 History   First MD Initiated Contact with Patient 09/08/15 1602     Chief Complaint  Patient presents with  . Neurologic Problem     (Consider location/radiation/quality/duration/timing/severity/associated sxs/prior Treatment) HPI Comments: Diagnosed with 2 lacunar/ischemic strokes 3wk ago confirmed CT/MRI at [facility], also had UTI, completed abx After [facility] last September, has not returned to normal physical activity (80%), PT has helped, then it worsens. At [facility] had improved with PT Over last 24hr slurred speech, shuffling feet, memory gaps, not remembering when things happened.  Slurred speech and shuffling gait are new in last 24hr. Lower right side and hip pain, question unwitnessed fall given memory issues--at home during day alone After initial SAH deficits include decreased L pedal pressure (no facial droop or other weakness, and short term memory loss) In last 24hr memory difficult, at 600AM woke family up saying time to get daughter Lexi, increased confusion. Baseline and now can tell month, year but not date. Has not had physical therapy.  Went to [facility]-didn't recognize son, worsened memory issues, had witnessed fall  Patient is a 64 y.o. female presenting with neurologic complaint.  Neurologic Problem Associated symptoms include abdominal pain (RLQ). Pertinent negatives include no chest pain, no headaches and no shortness of breath.    Past Medical History  Diagnosis Date  . Hypertension   . Migraine   . Cancer     Cervicle  . Hepatitis C   . Stroke    Past Surgical History  Procedure Laterality Date  . Abdominal hysterectomy    . Radiology with anesthesia N/A 09/11/2014    Procedure: Sub Arachnoid Aneurysm Coiling -RADIOLOGY WITH ANESTHESIA ;  Surgeon: Consuella Lose, MD;  Location: [place];  Service: Radiology;  Laterality: N/A;  . Peg placement N/A 09/21/2014    Procedure: PERCUTANEOUS ENDOSCOPIC GASTROSTOMY (PEG)  PLACEMENT (BED SIDE);  Surgeon: Georganna Skeans, MD;  Location: [facility] ENDOSCOPY;  Service: General;  Laterality: N/A;   Family History  Problem Relation Age of Onset  . Alcohol abuse Father    Social History  Substance Use Topics  . Smoking status: Former Smoker -- 0.50 packs/day    Types: Cigarettes    Quit date: 09/06/2015  . Smokeless tobacco: None  . Alcohol Use: Yes     Comment: rarely   OB History    No data available     Review of Systems  Constitutional: Negative for fever.  HENT: Negative for sore throat.   Eyes: Negative for visual disturbance.  Respiratory: Negative for cough and shortness of breath.   Cardiovascular: Negative for chest pain.  Gastrointestinal: Positive for abdominal pain (RLQ). Negative for nausea and vomiting.  Genitourinary: Negative for difficulty urinating.  Musculoskeletal: Positive for arthralgias (R side/r hip). Negative for back pain and neck pain.  Skin: Negative for rash.  Neurological: Negative for syncope and headaches.      Allergies  Bee venom  Home Medications   Prior to Admission medications   Medication Sig Start Date End Date Taking? Authorizing Provider  aspirin 325 MG EC tablet Take 325 mg by mouth daily.   Yes Historical Provider, MD  atorvastatin (LIPITOR) 20 MG tablet Take 20 mg by mouth daily at 6 PM.   Yes Historical Provider, MD  clonazePAM (KLONOPIN) 0.5 MG tablet Take 0.5 mg by mouth 2 (two) times daily.   Yes Historical Provider, MD  EPINEPHrine (EPIPEN) 0.3 mg/0.3 mL IJ SOAJ injection Inject 0.3 mg  into the muscle once as needed (for allergic reaction).   Yes Historical Provider, MD  levETIRAcetam (KEPPRA) 500 MG tablet Take 500 mg by mouth 3 (three) times daily.   Yes Historical Provider, MD  meloxicam (MOBIC) 15 MG tablet Take 15 mg by mouth daily.    Yes Historical Provider, MD  mirtazapine (REMERON) 15 MG tablet Take 15 mg by mouth at bedtime.   Yes Historical Provider, MD  pantoprazole (PROTONIX) 40 MG  injection Inject 40 mg into the vein at bedtime. 09/24/14  Yes Consuella Lose, MD  predniSONE (DELTASONE) 5 MG tablet Take 5 mg by mouth daily with breakfast.   Yes Historical Provider, MD  promethazine (PHENERGAN) 25 MG tablet Take 50 mg by mouth 2 (two) times daily.    Yes Historical Provider, MD  topiramate (TOPAMAX) 25 MG tablet Take 50 mg by mouth at bedtime.   Yes Historical Provider, MD  traZODone (DESYREL) 50 MG tablet Take 50 mg by mouth at bedtime.    Yes Historical Provider, MD  venlafaxine XR (EFFEXOR-XR) 75 MG 24 hr capsule Take 225 mg by mouth daily with breakfast.   Yes Historical Provider, MD  albuterol (PROVENTIL) (2.5 MG/3ML) 0.083% nebulizer solution Take 3 mLs (2.5 mg total) by nebulization every 2 (two) hours as needed for wheezing. Patient not taking: Reported on 09/08/2015 09/24/14   Consuella Lose, MD  bisacodyl (DULCOLAX) 10 MG suppository Place 1 suppository (10 mg total) rectally daily as needed for moderate constipation. Patient not taking: Reported on 09/08/2015 09/24/14   Consuella Lose, MD  budesonide (PULMICORT) 0.5 MG/2ML nebulizer solution Take 2 mLs (0.5 mg total) by nebulization 2 (two) times daily. Patient not taking: Reported on 09/08/2015 09/24/14   Consuella Lose, MD  ipratropium-albuterol (DUONEB) 0.5-2.5 (3) MG/3ML SOLN Take 3 mLs by nebulization every 6 (six) hours. Patient not taking: Reported on 09/08/2015 09/24/14   Consuella Lose, MD  levETIRAcetam (KEPRRA) 500 MG/100ML SOLN Inject 100 mLs (500 mg total) into the vein every 12 (twelve) hours. Patient not taking: Reported on 09/08/2015 09/24/14   Consuella Lose, MD  NiMODipine (NYMALIZE) 60 MG/20ML SOLN Place 20 mLs (60 mg total) into feeding tube every 4 (four) hours. 09/24/14 10/01/14  Consuella Lose, MD  Nutritional Supplements (FEEDING SUPPLEMENT, VITAL AF 1.2 CAL,) LIQD Place 1,000 mLs into feeding tube continuous. Patient not taking: Reported on 09/08/2015 09/24/14   Consuella Lose, MD   Water For Irrigation, Sterile (FREE WATER) SOLN Place 250 mLs into feeding tube every 6 (six) hours. Patient not taking: Reported on 09/08/2015 09/24/14   Consuella Lose, MD   BP 117/51 mmHg  Pulse 79  Temp(Src) 97.7 F (36.5 C) (Oral)  Resp 18  Ht 5\' 3"  (1.6 m)  Wt 148 lb 2.4 oz (67.2 kg)  BMI 26.25 kg/m2  SpO2 96% Physical Exam  Constitutional: She appears well-developed and well-nourished. No distress.  HENT:  Head: Normocephalic and atraumatic.  Mouth/Throat: No oropharyngeal exudate.  Eyes: Conjunctivae and EOM are normal.  Neck: Normal range of motion.  Cardiovascular: Normal rate, regular rhythm, normal heart sounds and intact distal pulses.  Exam reveals no gallop and no friction rub.   No murmur heard. Pulmonary/Chest: Effort normal and breath sounds normal. No respiratory distress. She has no wheezes. She has no rales.  Abdominal: Soft. She exhibits no distension. There is no tenderness. There is no guarding.  Musculoskeletal: She exhibits no edema.       Right hip: She exhibits tenderness. She exhibits normal range of motion.  Right knee: She exhibits normal range of motion. Tenderness found.       Left knee: She exhibits normal range of motion.  Neurological: She is alert. She has normal strength. No sensory deficit. Cranial nerve deficit: mild left facial droop (son believes this is new in last 24hr) Coordination normal. GCS eye subscore is 4. GCS verbal subscore is 4. GCS motor subscore is 6.  Oriented x2   Skin: Skin is warm and dry. No rash noted. She is not diaphoretic. No erythema.  Nursing note and vitals reviewed.   ED Course  Procedures (including critical care time) Labs Review Labs Reviewed  CBC - Abnormal; Notable for the following:    WBC 20.9 (*)    Hemoglobin 15.1 (*)    All other components within normal limits  DIFFERENTIAL - Abnormal; Notable for the following:    Neutrophils Relative % 86 (*)    Lymphocytes Relative 8 (*)    Neutro  Abs 17.9 (*)    Monocytes Absolute 1.3 (*)    All other components within normal limits  COMPREHENSIVE METABOLIC PANEL - Abnormal; Notable for the following:    CO2 21 (*)    Glucose, Bld 106 (*)    Creatinine, Ser 1.01 (*)    GFR calc non Af Amer 58 (*)    All other components within normal limits  URINALYSIS, ROUTINE W REFLEX MICROSCOPIC (NOT AT [facility]) - Abnormal; Notable for the following:    Specific Gravity, Urine 1.038 (*)    Hgb urine dipstick LARGE (*)    Protein, ur 30 (*)    Leukocytes, UA SMALL (*)    All other components within normal limits  CBG MONITORING, ED - Abnormal; Notable for the following:    Glucose-Capillary 115 (*)    All other components within normal limits  I-STAT CHEM 8, ED - Abnormal; Notable for the following:    Glucose, Bld 107 (*)    Hemoglobin 16.0 (*)    HCT 47.0 (*)    All other components within normal limits  URINE CULTURE  CULTURE, BLOOD (ROUTINE X 2)  CULTURE, BLOOD (ROUTINE X 2)  PROTIME-INR  APTT  URINE MICROSCOPIC-ADD ON  HEMOGLOBIN A1C  LIPID PANEL  I-STAT TROPOININ, ED  I-STAT CG4 LACTIC ACID, ED    Imaging Review Dg Chest 2 View  09/08/2015   CLINICAL DATA:  Confusion.  EXAM: CHEST  2 VIEW  COMPARISON:  September 24, 2014.  FINDINGS: The heart size and mediastinal contours are within normal limits. No pneumothorax is noted. Left lung is clear. No significant pleural effusion is noted. Mild right basilar opacity is noted concerning for subsegmental atelectasis. The visualized skeletal structures are unremarkable.  IMPRESSION: Probable mild right basilar subsegmental atelectasis.   Electronically Signed   By: Marijo Conception, M.D.   On: 09/08/2015 17:43   Ct Head Wo Contrast  09/08/2015   CLINICAL DATA:  Difficulty walking, recent ischemic stroke 9 days ago  EXAM: CT HEAD WITHOUT CONTRAST  TECHNIQUE: Contiguous axial images were obtained from the base of the skull through the vertex without intravenous contrast.  COMPARISON:   10/07/2014  FINDINGS: The bony calvarium is intact. There are changes consistent with prior coiling which are stable from the prior exam. Scattered areas of decreased attenuation are noted consistent with prior white matter ischemic change. No findings to suggest acute hemorrhage, acute infarction or space-occupying mass lesion are noted.  IMPRESSION: Chronic ischemic changes and changes of prior aneurysm coiling. No acute abnormality  noted.   Electronically Signed   By: Inez Catalina M.D.   On: 09/08/2015 17:23   Mr Brain Wo Contrast  09/09/2015   CLINICAL DATA:  Initial evaluation for left-sided facial droop.  EXAM: MRI HEAD WITHOUT CONTRAST  MRA HEAD WITHOUT CONTRAST  TECHNIQUE: Multiplanar, multiecho pulse sequences of the brain and surrounding structures were obtained without intravenous contrast. Angiographic images of the head were obtained using MRA technique without contrast.  COMPARISON:  Prior CT from earlier the same day.  FINDINGS: MRI HEAD FINDINGS  There are several scattered small patchy foci of high signal intensity on DWI sequence involving the deep white matter of the bilateral centrum semi ovale, suspicious for possible small acute ischemic changes. There are is probable corresponding signal loss on ADC map seen about several of these foci, although some of these likely reflect T2 shine through, particularly within the left centrum semi ovale. Few scattered chronic lacunar infarcts present within this region. No associated hemorrhage or mass effect. No other infarct. Normal intravascular flow voids maintained.  Mild to moderate chronic small vessel ischemic changes present within the periventricular and deep white matter. No mass lesion, midline shift, or mass effect. No hydrocephalus. No extra-axial fluid collection. Scattered chronic hemosiderin staining seen about the sylvian fissures bilaterally as well as at the occipital horns of both lateral ventricles, likely related to episode of  previous subarachnoid hemorrhage.  Craniocervical junction within normal limits. Mild degenerative changes noted within the partially visualized upper cervical spine. Incidental note made of a partially empty sella.  No acute abnormality about the orbits.  Paranasal sinuses are clear. No mastoid effusion. Inner ear structures grossly normal.  Bone marrow signal intensity within normal limits. Scalp soft tissues unremarkable.  MRA HEAD FINDINGS  ANTERIOR CIRCULATION:  Visualized distal cervical segments of the internal carotid arteries are patent with antegrade flow. Petrous segments widely patent. Cavernous and supraclinoid segments well opacified. A1 segments widely patent. 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 No residual aneurysm appreciated. Anterior cerebral arteries well opacified.  M1 segments patent without occlusion or high-grade stenosis. Mild atheromatous irregularity within the M1 segments bilaterally. MCA bifurcations normal. Distal MCA branches symmetric.  POSTERIOR CIRCULATION:  Vertebral arteries widely patent to the vertebrobasilar junction. Posterior inferior cerebral arteries well opacified. Basilar artery widely patent. Superior cerebellar arteries opacified proximally. Both the posterior cerebral arteries arise from the basilar artery and are well opacified to their distal aspects.  No new aneurysm or vascular malformation.  IMPRESSION: MRI HEAD IMPRESSION:  1. Patchy high signal intensity within the deep white matter of the bilateral centrum semi ovale, suspicious for possible small vessel ischemia. Please note that a portion of these findings main reflect T2 shine through/artifact from underlying chronic small vessel ischemic disease. Correlation with symptomatology and physical exam recommended. 2. Mild to moderate chronic small vessel ischemic disease.  MRA HEAD IMPRESSION:  1. No large or proximal arterial branch occlusion identified within the  intracranial circulation. No high-grade or correctable stenosis identified. 2. 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 No residual aneurysm identified.   Electronically Signed   By: Jeannine Boga M.D.   On: 09/09/2015 01:38   Ct Abdomen Pelvis W Contrast  09/08/2015   CLINICAL DATA:  Right lower quadrant pain  EXAM: CT ABDOMEN AND PELVIS WITH CONTRAST  TECHNIQUE: Multidetector CT imaging of the abdomen and pelvis was performed using the standard protocol following bolus administration of intravenous contrast.  CONTRAST:  126mL  OMNIPAQUE IOHEXOL 300 MG/ML  SOLN  COMPARISON:  None.  FINDINGS: Lower chest and abdominal wall: Large sliding hiatal hernia with retention of oral contrast.  Patchy ground-glass opacity and bandlike opacities in the right lower lobe most consistent with atelectasis.  Extensive coronary atherosclerotic calcification.  Hepatobiliary: No focal liver abnormality.No evidence of biliary obstruction or stone.  Pancreas: Borderline main duct enlargement at 3 mm. No visible mass or inflammation.  Spleen: Unremarkable.  Adrenals/Urinary Tract: Negative adrenals. Left nephrolithiasis, including a 20 mm nonobstructing, branching stone in the pelvis. Smooth urothelial left renal pelvis asymmetric thickening, presumably from irritation. Unremarkable bladder.  Reproductive:Hysterectomy and possible oophorectomies.  Stomach/Bowel: No obstruction. Appendix not identified. No indication of appendicitis.  Vascular/Lymphatic: Diffuse atherosclerotic calcification. No acute vascular abnormality. No mass or adenopathy.  Peritoneal: No ascites or pneumoperitoneum.  Musculoskeletal: No acute abnormalities. Multilevel facet arthropathy and disc degeneration.  IMPRESSION: 1. No explanation for right lower quadrant pain. 2. Urothelial thickening at the left renal pelvis, presumably related to branching 2 cm calculus. Urinalysis correlation recommended. 3. Right lower lobe atelectasis. 4. Large  sliding hiatal hernia.   Electronically Signed   By: Monte Fantasia M.D.   On: 09/08/2015 17:35   Dg Knee Complete 4 Views Right  09/08/2015   CLINICAL DATA:  Confusion.  Shaft wing gait and slurred speech.  EXAM: RIGHT KNEE - COMPLETE 4+ VIEW  COMPARISON:  None  FINDINGS: No joint effusion. No fracture or subluxation. 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 There is no fracture or subluxation identified.  IMPRESSION: 1. No acute findings. 2. Osteoarthritis.   Electronically Signed   By: Kerby Moors M.D.   On: 09/08/2015 17:45   Mr Jodene Nam Head/brain Wo Cm  09/09/2015   CLINICAL DATA:  Initial evaluation for left-sided facial droop.  EXAM: MRI HEAD WITHOUT CONTRAST  MRA HEAD WITHOUT CONTRAST  TECHNIQUE: Multiplanar, multiecho pulse sequences of the brain and surrounding structures were obtained without intravenous contrast. Angiographic images of the head were obtained using MRA technique without contrast.  COMPARISON:  Prior CT from earlier the same day.  FINDINGS: MRI HEAD FINDINGS  There are several scattered small patchy foci of high signal intensity on DWI sequence involving the deep white matter of the bilateral centrum semi ovale, suspicious for possible small acute ischemic changes. There are is probable corresponding signal loss on ADC map seen about several of these foci, although some of these likely reflect T2 shine through, particularly within the left centrum semi ovale. Few scattered chronic lacunar infarcts present within this region. No associated hemorrhage or mass effect. No other infarct. Normal intravascular flow voids maintained.  Mild to moderate chronic small vessel ischemic changes present within the periventricular and deep white matter. No mass lesion, midline shift, or mass effect. No hydrocephalus. No extra-axial fluid collection. Scattered chronic hemosiderin staining seen about the sylvian fissures bilaterally as well as at the occipital horns of both lateral  ventricles, likely related to episode of previous subarachnoid hemorrhage.  Craniocervical junction within normal limits. Mild degenerative changes noted within the partially visualized upper cervical spine. Incidental note made of a partially empty sella.  No acute abnormality about the orbits.  Paranasal sinuses are clear. No mastoid effusion. Inner ear structures grossly normal.  Bone marrow signal intensity within normal limits. Scalp soft tissues unremarkable.  MRA HEAD FINDINGS  ANTERIOR CIRCULATION:  Visualized distal cervical segments of the internal carotid arteries are patent with antegrade flow. Petrous segments widely patent. Cavernous and supraclinoid segments well  opacified. A1 segments widely patent. 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 No residual aneurysm appreciated. Anterior cerebral arteries well opacified.  M1 segments patent without occlusion or high-grade stenosis. Mild atheromatous irregularity within the M1 segments bilaterally. MCA bifurcations normal. Distal MCA branches symmetric.  POSTERIOR CIRCULATION:  Vertebral arteries widely patent to the vertebrobasilar junction. Posterior inferior cerebral arteries well opacified. Basilar artery widely patent. Superior cerebellar arteries opacified proximally. Both the posterior cerebral arteries arise from the basilar artery and are well opacified to their distal aspects.  No new aneurysm or vascular malformation.  IMPRESSION: MRI HEAD IMPRESSION:  1. Patchy high signal intensity within the deep white matter of the bilateral centrum semi ovale, suspicious for possible small vessel ischemia. Please note that a portion of these findings main reflect T2 shine through/artifact from underlying chronic small vessel ischemic disease. Correlation with symptomatology and physical exam recommended. 2. Mild to moderate chronic small vessel ischemic disease.  MRA HEAD IMPRESSION:  1. No large or proximal arterial  branch occlusion identified within the intracranial circulation. No high-grade or correctable stenosis identified. 2. 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 No residual aneurysm identified.   Electronically Signed   By: Jeannine Boga M.D.   On: 09/09/2015 01:38   Dg Hip Unilat With Pelvis 2-3 Views Right  09/08/2015   CLINICAL DATA:  Shuffling gait and recent fall with right hip pain, initial encounter  EXAM: DG HIP (WITH OR WITHOUT PELVIS) 2-3V RIGHT  COMPARISON:  None.  FINDINGS: Contrast material is noted within the collecting systems and bladder related to the recent CT examination. The pelvic ring is intact. No acute fracture or dislocation is noted. Mild degenerative changes of the right hip joint are seen. No soft tissue abnormality is noted.  IMPRESSION: No acute abnormality seen.   Electronically Signed   By: Inez Catalina M.D.   On: 09/08/2015 17:43   I have personally reviewed and evaluated these images and lab results as part of my medical decision-making.   EKG Interpretation None      MDM   Final diagnoses:  Fall    64yo female with history of SAH, recent admission to [facility] for increasing confusion found to have lacunar infarcts, remote hx of cervical cancer, knee problems, migraines, hypertension, Hepatitis C presents with concern of a shuffling gait, slurred speech, confusion.  CT head showed no acute abnormalities.  Patient also reported right lower quadrant pain and a CT abdomen was done which showed left sided urothelial thickening and nonobstructing stone. Lab work was concerning for a white blood cell count of 20.9, signs of urinary tract infection.  Chest x-ray showed likely atelectasis.  Blood cultures were ordered and patient was given Rocephin for concern of UTI.  Lactate was within normal limits.  Hospitalist was consulted for admission for sepsis likely secondary to urinary source.  Consulted neurology for concern of worsening neurologic symptoms as  well as evidence of possible new (per son) left-sided facial droop on exam.  Pt admitted to hospitalist with neuro consult.     Gareth Morgan, MD 09/09/15 [phone]

## 2015-09-08 NOTE — ED Notes (Signed)
Pt recently was treated with UTI.

## 2015-09-08 NOTE — H&P (Signed)
PCP:  Ave Filter   Referring provider  [place]   Chief Complaint:  confusion  HPI: Mindy Mills is a 64 y.o. female   has a past medical history of Hypertension; Migraine; Cancer; Hepatitis C; and Stroke.   Presented with  24 hours of confusion. Since her hemorrhagic stroke she have had mild confusion.  She woke up this Am a bit more confused. She seemed lethargic. Denies any fever. But low grade up to 99 in eR.Denies Disuria, She does endorse some tinnitus in right ear. It is unclear when patient fell but family was concerned that she broke her ribs.  Today she was walking with shuffling gate. Family noted today she had a left facial droop.  Chest evaluation and probable mild right basilar subsegmental atelectasis, UA was significant for UTI Patient was complaining of abdominal pain CT scan of abdomen show no acute findings. Given neurological complains CT scan of the head was done showing chronic ischemic changes and changes prior aneurysmal coiling but no acute abnormality noted.  Neurology has been notified and will see patient in consult Ordered MRI patient in the past have had an MRI despite having coiling. 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 She was also found to have cardiomyopathy with diminished EF 35% foot secondary to takotsubo     Hospitalist was called for admission for UTi and encephalopathy  Review of Systems:    Pertinent positives include:  Right abdominal pain, confusion  Constitutional:  No weight loss, night sweats, Fevers, chills, fatigue, weight loss  HEENT:  No headaches, Difficulty swallowing,Tooth/dental problems,Sore throat,  No sneezing, itching, ear ache, nasal congestion, post nasal  drip,  Cardio-vascular:  No chest pain, Orthopnea, PND, anasarca, dizziness, palpitations.no Bilateral lower extremity swelling  GI:  No heartburn, indigestion, abdominal pain, nausea, vomiting, diarrhea, change in bowel habits, loss of appetite, melena, blood in stool, hematemesis Resp:  no shortness of breath at rest. No dyspnea on exertion, No excess mucus, no productive cough, No non-productive cough, No coughing up of blood.No change in color of mucus.No wheezing. Skin:  no rash or lesions. No jaundice GU:  no dysuria, change in color of urine, no urgency or frequency. No straining to urinate.  No flank pain.  Musculoskeletal:  No joint pain or no joint swelling. No decreased range of motion. No back pain.  Psych:  No change in mood or affect. No depression or anxiety. No memory loss.  Neuro: no localizing neurological complaints, no tingling, no weakness, no double vision, no gait abnormality, no slurred speech, no   Otherwise ROS are negative except for above, 10 systems were reviewed  Past Medical History: Past Medical History  Diagnosis Date  . Hypertension   . Migraine   . Cancer     Cervicle  . Hepatitis C   . Stroke    Past Surgical History  Procedure Laterality Date  . Abdominal hysterectomy    . Radiology with anesthesia N/A 09/11/2014    Procedure: Sub Arachnoid Aneurysm Coiling -RADIOLOGY WITH ANESTHESIA ;  Surgeon: Consuella Lose, MD;  Location: [facility];  Service: Radiology;  Laterality: N/A;  . Peg placement N/A 09/21/2014    Procedure: PERCUTANEOUS ENDOSCOPIC GASTROSTOMY (PEG) PLACEMENT (BED SIDE);  Surgeon: Georganna Skeans, MD;  Location: [facility]  ENDOSCOPY;  Service: General;  Laterality: N/A;     Medications: Prior to Admission medications   Medication Sig Start Date End Date Taking? Authorizing Provider  aspirin 325 MG EC tablet Take 325 mg by mouth daily.   Yes Historical Provider, MD  atorvastatin (LIPITOR) 20 MG tablet Take 20 mg by mouth daily at 6 PM.    Yes Historical Provider, MD  clonazePAM (KLONOPIN) 0.5 MG tablet Take 0.5 mg by mouth 2 (two) times daily.   Yes Historical Provider, MD  EPINEPHrine (EPIPEN) 0.3 mg/0.3 mL IJ SOAJ injection Inject 0.3 mg into the muscle once as needed (for allergic reaction).   Yes Historical Provider, MD  levETIRAcetam (KEPPRA) 500 MG tablet Take 500 mg by mouth 3 (three) times daily.   Yes Historical Provider, MD  meloxicam (MOBIC) 15 MG tablet Take 15 mg by mouth daily.    Yes Historical Provider, MD  mirtazapine (REMERON) 15 MG tablet Take 15 mg by mouth at bedtime.   Yes Historical Provider, MD  pantoprazole (PROTONIX) 40 MG injection Inject 40 mg into the vein at bedtime. 09/24/14  Yes Consuella Lose, MD  predniSONE (DELTASONE) 5 MG tablet Take 5 mg by mouth daily with breakfast.   Yes Historical Provider, MD  promethazine (PHENERGAN) 25 MG tablet Take 50 mg by mouth 2 (two) times daily.    Yes Historical Provider, MD  topiramate (TOPAMAX) 25 MG tablet Take 50 mg by mouth at bedtime.   Yes Historical Provider, MD  traZODone (DESYREL) 50 MG tablet Take 50 mg by mouth at bedtime.    Yes Historical Provider, MD  venlafaxine XR (EFFEXOR-XR) 75 MG 24 hr capsule Take 225 mg by mouth daily with breakfast.   Yes Historical Provider, MD  albuterol (PROVENTIL) (2.5 MG/3ML) 0.083% nebulizer solution Take 3 mLs (2.5 mg total) by nebulization every 2 (two) hours as needed for wheezing. Patient not taking: Reported on 09/08/2015 09/24/14   Consuella Lose, MD  bisacodyl (DULCOLAX) 10 MG suppository Place 1 suppository (10 mg total) rectally daily as needed for moderate constipation. Patient not taking: Reported on 09/08/2015 09/24/14   Consuella Lose, MD  budesonide (PULMICORT) 0.5 MG/2ML nebulizer solution Take 2 mLs (0.5 mg total) by nebulization 2 (two) times daily. Patient not taking: Reported on 09/08/2015 09/24/14   Consuella Lose, MD  ipratropium-albuterol (DUONEB) 0.5-2.5 (3) MG/3ML SOLN Take 3 mLs by  nebulization every 6 (six) hours. Patient not taking: Reported on 09/08/2015 09/24/14   Consuella Lose, MD  levETIRAcetam (KEPRRA) 500 MG/100ML SOLN Inject 100 mLs (500 mg total) into the vein every 12 (twelve) hours. Patient not taking: Reported on 09/08/2015 09/24/14   Consuella Lose, MD  NiMODipine (NYMALIZE) 60 MG/20ML SOLN Place 20 mLs (60 mg total) into feeding tube every 4 (four) hours. 09/24/14 10/01/14  Consuella Lose, MD  Nutritional Supplements (FEEDING SUPPLEMENT, VITAL AF 1.2 CAL,) LIQD Place 1,000 mLs into feeding tube continuous. Patient not taking: Reported on 09/08/2015 09/24/14   Consuella Lose, MD  Water For Irrigation, Sterile (FREE WATER) SOLN Place 250 mLs into feeding tube every 6 (six) hours. Patient not taking: Reported on 09/08/2015 09/24/14   Consuella Lose, MD    Allergies:   Allergies  Allergen Reactions  . Bee Venom Anaphylaxis and Hives    Social History:  Ambulatory  independently   Lives at home With family     reports that she quit smoking 2 days ago. Her smoking use included Cigarettes. She smoked 0.50 packs per day. She does not have  any smokeless tobacco history on file. She reports that she drinks alcohol. She reports that she uses illicit drugs (Marijuana).    Family History: family history includes Alcohol abuse in her father.    Physical Exam: Patient Vitals for the past 24 hrs:  BP Temp Temp src Pulse Resp SpO2 Height  09/08/15 1944 - 98 F (36.7 C) - - - - -  09/08/15 1930 96/59 mmHg - - 84 - 96 % -  09/08/15 1915 97/67 mmHg - - 86 - 95 % -  09/08/15 1900 100/75 mmHg - - 86 20 95 % -  09/08/15 1615 111/78 mmHg - - 98 (!) 28 95 % -  09/08/15 1600 109/82 mmHg - - 98 22 95 % -  09/08/15 1555 111/73 mmHg - - 96 20 94 % -  09/08/15 1534 111/72 mmHg 99 F (37.2 C) Oral 105 16 91 % 5\' 3"  (1.6 m)    1. General:  in No Acute distress 2. Psychological: Alert and  Oriented 3. Head/ENT:   Dry Mucous Membranes                           Head Non traumatic, neck supple                         Poor Dentition 4. SKIN:  decreased Skin turgor,  Skin clean Dry and intact no rash 5. Heart: Regular rate and rhythm no Murmur, Rub or gallop 6. Lungs: Clear to auscultation bilaterally, no wheezes or crackles   7. Abdomen: Soft, non-tender, Non distended 8. Lower extremities: no clubbing, cyanosis, or edema 9. Neurologically has somewhat diminished right leg strength which she states is chronic, no pronator drift, grips equal bilaterally, cranial nerves significant for left facial droop which family states is new 19. MSK: Normal range of motion  body mass index is unknown because there is no weight on file.   Labs on Admission:   Results for orders placed or performed during the hospital encounter of 09/08/15 (from the past 24 hour(s))  CBG monitoring, ED     Status: Abnormal   Collection Time: 09/08/15  3:55 PM  Result Value Ref Range   Glucose-Capillary 115 (H) 65 - 99 mg/dL   Comment 1 Notify RN   I-stat troponin, ED (not at [facility], [facility])     Status: None   Collection Time: 09/08/15  3:57 PM  Result Value Ref Range   Troponin i, poc 0.00 0.00 - 0.08 ng/mL   Comment 3          I-Stat Chem 8, ED  (not at [facility], [facility])     Status: Abnormal   Collection Time: 09/08/15  3:58 PM  Result Value Ref Range   Sodium 143 135 - 145 mmol/L   Potassium 4.1 3.5 - 5.1 mmol/L   Chloride 108 101 - 111 mmol/L   BUN 19 6 - 20 mg/dL   Creatinine, Ser 1.00 0.44 - 1.00 mg/dL   Glucose, Bld 107 (H) 65 - 99 mg/dL   Calcium, Ion 1.15 1.13 - 1.30 mmol/L   TCO2 21 0 - 100 mmol/L   Hemoglobin 16.0 (H) 12.0 - 15.0 g/dL   HCT 47.0 (H) 36.0 - 46.0 %  Protime-INR     Status: None   Collection Time: 09/08/15  4:07 PM  Result Value Ref Range   Prothrombin Time 13.4 11.6 - 15.2 seconds   INR 1.00  0.00 - 1.49  APTT     Status: None   Collection Time: 09/08/15  4:07 PM  Result Value Ref Range   aPTT 28 24 - 37 seconds  CBC     Status: Abnormal    Collection Time: 09/08/15  4:07 PM  Result Value Ref Range   WBC 20.9 (H) 4.0 - 10.5 K/uL   RBC 5.09 3.87 - 5.11 MIL/uL   Hemoglobin 15.1 (H) 12.0 - 15.0 g/dL   HCT 45.1 36.0 - 46.0 %   MCV 88.6 78.0 - 100.0 fL   MCH 29.7 26.0 - 34.0 pg   MCHC 33.5 30.0 - 36.0 g/dL   RDW 13.2 11.5 - 15.5 %   Platelets 222 150 - 400 K/uL  Differential     Status: Abnormal   Collection Time: 09/08/15  4:07 PM  Result Value Ref Range   Neutrophils Relative % 86 (H) 43 - 77 %   Lymphocytes Relative 8 (L) 12 - 46 %   Monocytes Relative 6 3 - 12 %   Eosinophils Relative 0 0 - 5 %   Basophils Relative 0 0 - 1 %   Neutro Abs 17.9 (H) 1.7 - 7.7 K/uL   Lymphs Abs 1.7 0.7 - 4.0 K/uL   Monocytes Absolute 1.3 (H) 0.1 - 1.0 K/uL   Eosinophils Absolute 0.0 0.0 - 0.7 K/uL   Basophils Absolute 0.0 0.0 - 0.1 K/uL   WBC Morphology ATYPICAL LYMPHOCYTES   Comprehensive metabolic panel     Status: Abnormal   Collection Time: 09/08/15  4:07 PM  Result Value Ref Range   Sodium 140 135 - 145 mmol/L   Potassium 4.0 3.5 - 5.1 mmol/L   Chloride 108 101 - 111 mmol/L   CO2 21 (L) 22 - 32 mmol/L   Glucose, Bld 106 (H) 65 - 99 mg/dL   BUN 16 6 - 20 mg/dL   Creatinine, Ser 1.01 (H) 0.44 - 1.00 mg/dL   Calcium 8.9 8.9 - 10.3 mg/dL   Total Protein 6.9 6.5 - 8.1 g/dL   Albumin 3.7 3.5 - 5.0 g/dL   AST 21 15 - 41 U/L   ALT 21 14 - 54 U/L   Alkaline Phosphatase 86 38 - 126 U/L   Total Bilirubin 0.6 0.3 - 1.2 mg/dL   GFR calc non Af Amer 58 (L) >60 mL/min   GFR calc Af Amer >60 >60 mL/min   Anion gap 11 5 - 15  Urinalysis, Routine w reflex microscopic     Status: Abnormal   Collection Time: 09/08/15  6:17 PM  Result Value Ref Range   Color, Urine YELLOW YELLOW   APPearance CLEAR CLEAR   Specific Gravity, Urine 1.038 (H) 1.005 - 1.030   pH 6.5 5.0 - 8.0   Glucose, UA NEGATIVE NEGATIVE mg/dL   Hgb urine dipstick LARGE (A) NEGATIVE   Bilirubin Urine NEGATIVE NEGATIVE   Ketones, ur NEGATIVE NEGATIVE mg/dL   Protein, ur  30 (A) NEGATIVE mg/dL   Urobilinogen, UA 1.0 0.0 - 1.0 mg/dL   Nitrite NEGATIVE NEGATIVE   Leukocytes, UA SMALL (A) NEGATIVE  Urine microscopic-add on     Status: None   Collection Time: 09/08/15  6:17 PM  Result Value Ref Range   WBC, UA 21-50 <3 WBC/hpf   RBC / HPF 21-50 <3 RBC/hpf  I-Stat CG4 Lactic Acid, ED     Status: None   Collection Time: 09/08/15  6:39 PM  Result Value Ref Range   Lactic Acid,  Venous 1.29 0.5 - 2.0 mmol/L    UA UTI  Lab Results  Component Value Date   HGBA1C 5.9* 09/25/2014    CrCl cannot be calculated (Unknown ideal weight.).  BNP (last 3 results)  Recent Labs  09/11/14 2030  PROBNP 3137.0*    Other results:  I have pearsonaly reviewed this: ECG REPORT  Rate: 99  Rhythm: SR ST&T Change: no acute changes QTC 515  There were no vitals filed for this visit.   Cultures:    Component Value Date/Time   SDES TRACHEAL ASPIRATE 09/17/2014 1127   SPECREQUEST NONE 09/17/2014 1127   CULT  09/17/2014 1127    Non-Pathogenic Oropharyngeal-type Flora Isolated. Performed at [facility] 09/19/2014 FINAL 09/17/2014 1127     Radiological Exams on Admission: Dg Chest 2 View  09/08/2015   CLINICAL DATA:  Confusion.  EXAM: CHEST  2 VIEW  COMPARISON:  September 24, 2014.  FINDINGS: The heart size and mediastinal contours are within normal limits. No pneumothorax is noted. Left lung is clear. No significant pleural effusion is noted. Mild right basilar opacity is noted concerning for subsegmental atelectasis. The visualized skeletal structures are unremarkable.  IMPRESSION: Probable mild right basilar subsegmental atelectasis.   Electronically Signed   By: Marijo Conception, M.D.   On: 09/08/2015 17:43   Ct Head Wo Contrast  09/08/2015   CLINICAL DATA:  Difficulty walking, recent ischemic stroke 9 days ago  EXAM: CT HEAD WITHOUT CONTRAST  TECHNIQUE: Contiguous axial images were obtained from the base of the skull through the vertex  without intravenous contrast.  COMPARISON:  10/07/2014  FINDINGS: The bony calvarium is intact. There are changes consistent with prior coiling which are stable from the prior exam. Scattered areas of decreased attenuation are noted consistent with prior white matter ischemic change. No findings to suggest acute hemorrhage, acute infarction or space-occupying mass lesion are noted.  IMPRESSION: Chronic ischemic changes and changes of prior aneurysm coiling. No acute abnormality noted.   Electronically Signed   By: Inez Catalina M.D.   On: 09/08/2015 17:23   Ct Abdomen Pelvis W Contrast  09/08/2015   CLINICAL DATA:  Right lower quadrant pain  EXAM: CT ABDOMEN AND PELVIS WITH CONTRAST  TECHNIQUE: Multidetector CT imaging of the abdomen and pelvis was performed using the standard protocol following bolus administration of intravenous contrast.  CONTRAST:  171mL OMNIPAQUE IOHEXOL 300 MG/ML  SOLN  COMPARISON:  None.  FINDINGS: Lower chest and abdominal wall: Large sliding hiatal hernia with retention of oral contrast.  Patchy ground-glass opacity and bandlike opacities in the right lower lobe most consistent with atelectasis.  Extensive coronary atherosclerotic calcification.  Hepatobiliary: No focal liver abnormality.No evidence of biliary obstruction or stone.  Pancreas: Borderline main duct enlargement at 3 mm. No visible mass or inflammation.  Spleen: Unremarkable.  Adrenals/Urinary Tract: Negative adrenals. Left nephrolithiasis, including a 20 mm nonobstructing, branching stone in the pelvis. Smooth urothelial left renal pelvis asymmetric thickening, presumably from irritation. Unremarkable bladder.  Reproductive:Hysterectomy and possible oophorectomies.  Stomach/Bowel: No obstruction. Appendix not identified. No indication of appendicitis.  Vascular/Lymphatic: Diffuse atherosclerotic calcification. No acute vascular abnormality. No mass or adenopathy.  Peritoneal: No ascites or pneumoperitoneum.  Musculoskeletal:  No acute abnormalities. Multilevel facet arthropathy and disc degeneration.  IMPRESSION: 1. No explanation for right lower quadrant pain. 2. Urothelial thickening at the left renal pelvis, presumably related to branching 2 cm calculus. Urinalysis correlation recommended. 3. Right lower lobe atelectasis. 4. Large sliding hiatal  hernia.   Electronically Signed   By: Monte Fantasia M.D.   On: 09/08/2015 17:35   Dg Knee Complete 4 Views Right  09/08/2015   CLINICAL DATA:  Confusion.  Shaft wing gait and slurred speech.  EXAM: RIGHT KNEE - COMPLETE 4+ VIEW  COMPARISON:  None  FINDINGS: No joint effusion. No fracture or subluxation. 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 There is no fracture or subluxation identified.  IMPRESSION: 1. No acute findings. 2. Osteoarthritis.   Electronically Signed   By: Kerby Moors M.D.   On: 09/08/2015 17:45   Dg Hip Unilat With Pelvis 2-3 Views Right  09/08/2015   CLINICAL DATA:  Shuffling gait and recent fall with right hip pain, initial encounter  EXAM: DG HIP (WITH OR WITHOUT PELVIS) 2-3V RIGHT  COMPARISON:  None.  FINDINGS: Contrast material is noted within the collecting systems and bladder related to the recent CT examination. The pelvic ring is intact. No acute fracture or dislocation is noted. Mild degenerative changes of the right hip joint are seen. No soft tissue abnormality is noted.  IMPRESSION: No acute abnormality seen.   Electronically Signed   By: Inez Catalina M.D.   On: 09/08/2015 17:43    Chart has been reviewed  Family   at  Bedside  plan of care was discussed with   Jewel Baize [phone] Also wife phone Cyril Mourning Daughter in law [phone] (prefered number for tomorrow)   Assessment/Plan 64 year old female with history of subarachnoid bleeding again to ruptured aneurysm status post coiling here with encephalopathy, UTI, and left facial droop worrisome for TIA   Present on Admission:  . Fall it is unclear if patient  actually had a fall, at this point appears to be nontraumatic  . UTI (lower urinary tract infection) we'll treat with Rocephin await results of urine culture  . Dehydration administrated foods been mindful history of systolic heart failure  . Sepsis rehydrate administer IV antibiotics and monitor  . Systolic heart failure is his chronic repeat echo gram to see if the EF has improved since part of TIA workup  . Confusion encephalopathy likely related to UTI but given acute neurological changes will evaluate with MRI  . TIA (transient ischemic attack) - - will admit based on TIA/CVA protocol, await results of MRA/MRI, Carotid Doppler and Echo, obtain cardiac enzymes,  ECG,   Lipid panel, TSH. Order PT/OT evaluation. Will make sure patient is on antiplatelet agent.   Neurology consulted.       Prophylaxis: SCD   CODE STATUS:  FULL CODE   as per patient    Disposition:   To home once workup is complete and patient is stable  Other plan as per orders.  I have spent a total of 55 min on this admission  Tuyet Bader 09/08/2015, 9:16 PM  [facility]  Pager [phone]   after 2 AM please page floor coverage PA If 7AM-7PM, please contact the day team taking care of the patient  [email or web address]  Password [email or web address]

## 2015-09-08 NOTE — ED Notes (Signed)
Patient to go to MRI from here then we will take her to her room.

## 2015-09-08 NOTE — ED Notes (Signed)
At MRI at this time.

## 2015-09-09 ENCOUNTER — Encounter (HOSPITAL_COMMUNITY): Payer: Self-pay | Admitting: *Deleted

## 2015-09-09 DIAGNOSIS — R410 Disorientation, unspecified: Secondary | ICD-10-CM

## 2015-09-09 LAB — LIPID PANEL
CHOLESTEROL: 159 mg/dL (ref 0–200)
HDL: 52 mg/dL (ref >40)
LDL Cholesterol: 89 mg/dL (ref 0–99)
TRIGLYCERIDES: 90 mg/dL (ref <150)
Total CHOL/HDL Ratio: 3.1 RATIO
VLDL: 18 mg/dL (ref 0–40)

## 2015-09-09 MED ORDER — TRAMADOL HCL 50 MG PO TABS
50.0000 mg | ORAL_TABLET | Freq: Four times a day (QID) | ORAL | Status: DC | PRN
  Administered 2015-09-09 – 2015-09-10 (×4): 50 mg via ORAL
  Filled 2015-09-09 (×4): qty 1

## 2015-09-09 MED ORDER — PANTOPRAZOLE SODIUM 40 MG PO TBEC
40.0000 mg | DELAYED_RELEASE_TABLET | Freq: Every day | ORAL | Status: DC
  Administered 2015-09-09 – 2015-09-10 (×2): 40 mg via ORAL
  Filled 2015-09-09 (×2): qty 1

## 2015-09-09 MED ORDER — ENSURE ENLIVE PO LIQD
237.0000 mL | ORAL | Status: DC
  Administered 2015-09-09 – 2015-09-10 (×2): 237 mL via ORAL
  Filled 2015-09-09 (×3): qty 237

## 2015-09-09 MED ORDER — INFLUENZA VAC SPLIT QUAD 0.5 ML IM SUSY
0.5000 mL | PREFILLED_SYRINGE | INTRAMUSCULAR | Status: AC
  Administered 2015-09-10: 0.5 mL via INTRAMUSCULAR
  Filled 2015-09-09: qty 0.5

## 2015-09-09 MED ORDER — TRAZODONE HCL 50 MG PO TABS: 50.0000 mg | ORAL_TABLET | Freq: Every evening | ORAL | Status: DC | PRN

## 2015-09-09 NOTE — Clinical Social Work Placement (Signed)
   CLINICAL SOCIAL WORK PLACEMENT  NOTE  Date:  09/09/2015  Patient Details  Name: SAHORY NORDLING MRN: [id number] Date of Birth: November 26, 1951  Clinical Social Work is seeking post-discharge placement for this patient at the [place] level of care (*CSW will initial, date and re-position this form in  chart as items are completed):  Yes   Patient/family provided with [place] Work Department's list of facilities offering this level of care within the geographic area requested by the patient (or if unable, by the patient's family).  Yes   Patient/family informed of their freedom to choose among providers that offer the needed level of care, that participate in Medicare, Medicaid or managed care program needed by the patient, have an available bed and are willing to accept the patient.  Yes   Patient/family informed of [facility]'s ownership interest in [facility] and [facility], as well as of the fact that they are under no obligation to receive care at these facilities.  PASRR submitted to EDS on       PASRR number received on       Existing PASRR number confirmed on 09/09/15     FL2 transmitted to all facilities in geographic area requested by pt/family on 09/09/15     FL2 transmitted to all facilities within larger geographic area on       Patient informed that his/her managed care company has contracts with or will negotiate with certain facilities, including the following:            Patient/family informed of bed offers received.  Patient chooses bed at       Physician recommends and patient chooses bed at      Patient to be transferred to   on  .  Patient to be transferred to facility by       Patient family notified on   of transfer.  Name of family member notified:        PHYSICIAN Please sign FL2     Additional Comment:    _______________________________________________ Glendon Axe, MSW, [place] [phone] 09/09/2015 3:34 PM

## 2015-09-09 NOTE — Progress Notes (Signed)
Patient arrived from ED via tech. Reporting no pain with VSS. Oriented to room and just took to restroom. Will continue to monitor. Mindy Mills, [place] 09/09/2015 [phone]

## 2015-09-09 NOTE — Clinical Social Work Note (Signed)
Clinical Social Work Assessment  Patient Details  Name: Mindy Mills MRN: [id number] Date of Birth: 1951-10-25  Date of referral:  09/09/15               Reason for consult:  Facility Placement, Discharge Planning                Permission sought to share information with:  Case Manager, Facility Sport and exercise psychologist, Family Supports Permission granted to share information::  Yes, Verbal Permission Granted  Name::      Jenny Reichmann and [employer])  Agency::   (SNF's )  Relationship::   (Son and Daughter-in-law)  Sport and exercise psychologist Information:   [phone] and [phone])  Housing/Transportation Living arrangements for the past 2 months:  Single Family Home Source of Information:  Patient, Adult Children Patient Interpreter Needed:  None Criminal Activity/Legal Involvement Pertinent to Current Situation/Hospitalization:  No - Comment as needed Significant Relationships:  Adult Children, Other Family Members Lives with:  Adult Children Do you feel safe going back to the place where you live?  Yes Need for family participation in patient care:  Yes (Comment)  Care giving concerns: Patient requiring 24 hour supervision and assistance at SNF.    Social Worker assessment / plan: Holiday representative met with patient and dtr-in-law at bedside in reference to post-acute placement for SNF. CSW introduced CSW role and SNF process. CSW also reviewed and provided SNF list. Patient's dtr-in-law reported patient was a short-term resident of [place] in 2015 and recently at the [place] after suffering from a stroke. Pt's dtr-in-law indicated that patient did NOT receive therapy at [facility] SN[facility]. Pt's dtr-in-law stated that pt has become forgetful (i.e. leaving stove on). Pt's dtr-in-law stated patient is NOT safe alone at home and does NOT remember falling. Pt's dtr-in-law also rstated the ultimate goal is to get the patient into an ALF. Pt's dtr-in-law stated that she and pt's son, Jenny Reichmann has  already started researching ALF's. Pt has complete Medicaid application. Medicaid pending #[id number] Q. CSW further explained short-term SNF and ALF process. Patient and family agreeable to short-term placement at SNF and to initiate ALF search. No further concerns reported by family at this time. CSW will continue to follow pt and pt's family for continued support and facilitate pt's discharge needs once medically stable.   Employment status:  Retired Forensic scientist:  Medicare PT Recommendations:  [place] / Referral to community resources:  [place]  Patient/Family's Response to care:  Patient lying in bed alert and oriented x4. Pt and pt's family agreeable to short-term SNF and ultimately ALF placement. Pt and family pleasant and appreciated social work intervention.   Patient/Family's Understanding of and Emotional Response to Diagnosis, Current Treatment, and Prognosis:  Pt and family understanding of continued medical intervention. Pt's acknowledged that she is unsafe at home alone. CSW remains available.   Emotional Assessment Appearance:  Appears stated age Attitude/Demeanor/Rapport:   (Pleasant ) Affect (typically observed):  Accepting, Appropriate, Pleasant Orientation:  Oriented to Self, Oriented to Place, Oriented to  Time, Oriented to Situation Alcohol / Substance use:  Not Applicable Psych involvement (Current and /or in the community):  No (Comment)  Discharge Needs  Concerns to be addressed:  Care Coordination Readmission within the last 30 days:  No Current discharge risk:  Dependent with Mobility, Cognitively Impaired Barriers to Discharge:  Continued Medical Work up   [employer], MSW, LCSWA [phone] 09/09/2015 3:34 PM

## 2015-09-09 NOTE — Clinical Social Work Note (Signed)
Clinical Social Worker has assessed patient with dtr-in-law at bedside. Full psychosocial assessment to follow.  CSW remains available as needed.   Glendon Axe, MSW, LCSWA [phone] 09/09/2015 2:49 PM

## 2015-09-09 NOTE — Consult Note (Signed)
Referring Physician: Roel Cluck    Chief Complaint: Difficulty with gait, confusion  HPI: Mindy Mills is an 64 y.o. female who was brought in by he son this evening.  It appears that the patient was recently admitted to the [place] (3 weeks ago) and treated for acute infarcts and UTI.  Per son patient awakened this morning and was more confused than at baseline.  Described as memory gaps and not remembering things that happened.  Also patient with slurred speech and shuffling gait.   Patient with a history of SAH s/p coiling.  Has memory and gait issues at baseline.     Date last known well: Date: 09/07/2015 Time last known well: Unable to determine tPA Given: No: Outside time window, unclear LKW, h/o [facility]  Past Medical History  Diagnosis Date  . Hypertension   . Migraine   . Cancer     Cervicle  . Hepatitis C   . Stroke     Past Surgical History  Procedure Laterality Date  . Abdominal hysterectomy    . Radiology with anesthesia N/A 09/11/2014    Procedure: Sub Arachnoid Aneurysm Coiling -RADIOLOGY WITH ANESTHESIA ;  Surgeon: Consuella Lose, MD;  Location: [place];  Service: Radiology;  Laterality: N/A;  . Peg placement N/A 09/21/2014    Procedure: PERCUTANEOUS ENDOSCOPIC GASTROSTOMY (PEG) PLACEMENT (BED SIDE);  Surgeon: Georganna Skeans, MD;  Location: [facility] ENDOSCOPY;  Service: General;  Laterality: N/A;    Family History  Problem Relation Age of Onset  . Alcohol abuse Father    Social History:  reports that she quit smoking 3 days ago. Her smoking use included Cigarettes. She smoked 0.50 packs per day. She does not have any smokeless tobacco history on file. She reports that she drinks alcohol. She reports that she uses illicit drugs (Marijuana).  Allergies:  Allergies  Allergen Reactions  . Bee Venom Anaphylaxis and Hives    Medications:  I have reviewed the patient's current medications. Prior to Admission:  Prescriptions prior to admission  Medication Sig Dispense Refill  Last Dose  . aspirin 325 MG EC tablet Take 325 mg by mouth daily.   09/07/2015 at Unknown time  . atorvastatin (LIPITOR) 20 MG tablet Take 20 mg by mouth daily at 6 PM.   09/07/2015 at Unknown time  . clonazePAM (KLONOPIN) 0.5 MG tablet Take 0.5 mg by mouth 2 (two) times daily.   09/07/2015 at Unknown time  . EPINEPHrine (EPIPEN) 0.3 mg/0.3 mL IJ SOAJ injection Inject 0.3 mg into the muscle once as needed (for allergic reaction).   Never  . levETIRAcetam (KEPPRA) 500 MG tablet Take 500 mg by mouth 3 (three) times daily.   09/07/2015 at Unknown time  . meloxicam (MOBIC) 15 MG tablet Take 15 mg by mouth daily.    09/07/2015 at Unknown time  . mirtazapine (REMERON) 15 MG tablet Take 15 mg by mouth at bedtime.   09/07/2015 at Unknown time  . pantoprazole (PROTONIX) 40 MG injection Inject 40 mg into the vein at bedtime. 1 each  09/07/2015 at Unknown time  . predniSONE (DELTASONE) 5 MG tablet Take 5 mg by mouth daily with breakfast.   09/08/2015 at Unknown time  . promethazine (PHENERGAN) 25 MG tablet Take 50 mg by mouth 2 (two) times daily.    09/07/2015 at Unknown time  . topiramate (TOPAMAX) 25 MG tablet Take 50 mg by mouth at bedtime.   09/07/2015 at Unknown time  . traZODone (DESYREL) 50 MG tablet Take 50 mg by  mouth at bedtime.    09/07/2015 at Unknown time  . venlafaxine XR (EFFEXOR-XR) 75 MG 24 hr capsule Take 225 mg by mouth daily with breakfast.   09/08/2015 at Unknown time  . albuterol (PROVENTIL) (2.5 MG/3ML) 0.083% nebulizer solution Take 3 mLs (2.5 mg total) by nebulization every 2 (two) hours as needed for wheezing. (Patient not taking: Reported on 09/08/2015) 75 mL 12 Not Taking at Unknown time  . bisacodyl (DULCOLAX) 10 MG suppository Place 1 suppository (10 mg total) rectally daily as needed for moderate constipation. (Patient not taking: Reported on 09/08/2015) 12 suppository 0 Not Taking at Unknown time  . budesonide (PULMICORT) 0.5 MG/2ML nebulizer solution Take 2 mLs (0.5 mg total) by nebulization  2 (two) times daily. (Patient not taking: Reported on 09/08/2015) 2 mL 12 Not Taking at Unknown time  . ipratropium-albuterol (DUONEB) 0.5-2.5 (3) MG/3ML SOLN Take 3 mLs by nebulization every 6 (six) hours. (Patient not taking: Reported on 09/08/2015) 360 mL  Not Taking at Unknown time  . levETIRAcetam (KEPRRA) 500 MG/100ML SOLN Inject 100 mLs (500 mg total) into the vein every 12 (twelve) hours. (Patient not taking: Reported on 09/08/2015) 4000 mL  Not Taking at Unknown time  . NiMODipine (NYMALIZE) 60 MG/20ML SOLN Place 20 mLs (60 mg total) into feeding tube every 4 (four) hours. 2520 mL    . Nutritional Supplements (FEEDING SUPPLEMENT, VITAL AF 1.2 CAL,) LIQD Place 1,000 mLs into feeding tube continuous. (Patient not taking: Reported on 09/08/2015)   Not Taking at Unknown time  . Water For Irrigation, Sterile (FREE WATER) SOLN Place 250 mLs into feeding tube every 6 (six) hours. (Patient not taking: Reported on 09/08/2015)   Not Taking at Unknown time   Scheduled: . aspirin  300 mg Rectal Daily   Or  . aspirin  325 mg Oral Daily  . atorvastatin  20 mg Oral q1800  . cefTRIAXone (ROCEPHIN)  IV  1 g Intravenous Q24H  . clonazePAM  0.5 mg Oral BID  . levETIRAcetam  500 mg Oral TID  . mirtazapine  15 mg Oral QHS  . pantoprazole  40 mg Intravenous QHS  . predniSONE  10 mg Oral Q breakfast  . topiramate  50 mg Oral QHS  . traZODone  50 mg Oral QHS  . venlafaxine XR  225 mg Oral Q breakfast    ROS: History obtained from the patient  General ROS: negative for - chills, fatigue, fever, night sweats, weight gain or weight loss Psychological ROS: memory difficulties Ophthalmic ROS: negative for - blurry vision, double vision, eye pain or loss of vision ENT ROS:  tinnitus  Allergy and Immunology ROS: negative for - hives or itchy/watery eyes Hematological and Lymphatic ROS: negative for - bleeding problems, bruising or swollen lymph nodes Endocrine ROS: negative for - galactorrhea, hair pattern  changes, polydipsia/polyuria or temperature intolerance Respiratory ROS: negative for - cough, hemoptysis, shortness of breath or wheezing Cardiovascular ROS: negative for - chest pain, dyspnea on exertion, edema or irregular heartbeat Gastrointestinal ROS: negative for - abdominal pain, diarrhea, hematemesis, nausea/vomiting or stool incontinence Genito-Urinary ROS: negative for - dysuria, hematuria, incontinence or urinary frequency/urgency Musculoskeletal ROS: right knee pain Neurological ROS: as noted in HPI Dermatological ROS: negative for rash and skin lesion changes  Physical Examination: Blood pressure 112/48, pulse 79, temperature 98 F (36.7 C), temperature source Oral, resp. rate 20, height 5\' 3"  (1.6 m), weight 67.2 kg (148 lb 2.4 oz), SpO2 95 %.  HEENT-  Normocephalic, no lesions, without  obvious abnormality.  Normal external eye and conjunctiva.  Normal TM's bilaterally.  Normal auditory canals and external ears. Normal external nose, mucus membranes and septum.  Normal pharynx. Cardiovascular- S1, S2 normal, pulses palpable throughout   Lungs- chest clear, no wheezing, rales, normal symmetric air entry Abdomen- soft, non-tender; bowel sounds normal; no masses,  no organomegaly Extremities- no edema Lymph-no adenopathy palpable Musculoskeletal-right knee tenderness Skin-warm and dry, no hyperpigmentation, vitiligo, or suspicious lesions  Neurological Examination Mental Status: Alert. Oriented to name, place, month and year.  Knew the significance of 9/11.  Speech fluent without evidence of aphasia with some mild slurring.  Able to follow 3 step commands without difficulty. Cranial Nerves: II: Discs flat bilaterally; Visual fields grossly normal, pupils equal, round, reactive to light and accommodation III,IV, VI: ptosis not present, extra-ocular motions intact bilaterally V,VII: smile symmetric, facial light touch sensation normal bilaterally VIII: hearing normal  bilaterally IX,X: gag reflex present XI: bilateral shoulder shrug XII: midline tongue extension Motor: Right : Upper extremity   5/5      Left:     Upper extremity   5/5  Lower extremity   4/5 proximally due to pain    Lower extremity   5/5 Tone and bulk:normal tone throughout; no atrophy noted Sensory: Pinprick and light touch intact throughout, bilaterally Deep Tendon Reflexes: 2+ and symmetric throughout with 1+ AJ's bilaterally Plantars: Right: upgoing   Left: downgoing Cerebellar: normal finger-to-nose and normal heel-to-shin testing bilaterally Gait: not tested due to safety concerns   Laboratory Studies:  Basic Metabolic Panel:  Recent Labs Lab 09/08/15 1558 09/08/15 1607  NA 143 140  K 4.1 4.0  CL 108 108  CO2  --  21*  GLUCOSE 107* 106*  BUN 19 16  CREATININE 1.00 1.01*  CALCIUM  --  8.9    Liver Function Tests:  Recent Labs Lab 09/08/15 1607  AST 21  ALT 21  ALKPHOS 86  BILITOT 0.6  PROT 6.9  ALBUMIN 3.7   No results for input(s): LIPASE, AMYLASE in the last 168 hours. No results for input(s): AMMONIA in the last 168 hours.  CBC:  Recent Labs Lab 09/08/15 1558 09/08/15 1607  WBC  --  20.9*  NEUTROABS  --  17.9*  HGB 16.0* 15.1*  HCT 47.0* 45.1  MCV  --  88.6  PLT  --  222    Cardiac Enzymes: No results for input(s): CKTOTAL, CKMB, CKMBINDEX, TROPONINI in the last 168 hours.  BNP: Invalid input(s): POCBNP  CBG:  Recent Labs Lab 09/08/15 1555  GLUCAP 115*    Microbiology: Results for orders placed or performed during the hospital encounter of 09/11/14  MRSA PCR Screening     Status: None   Collection Time: 09/11/14  7:44 PM  Result Value Ref Range Status   MRSA by PCR NEGATIVE NEGATIVE Final    Comment:        The GeneXpert MRSA Assay (FDA approved for NASAL specimens only), is one component of a comprehensive MRSA colonization surveillance program. It is not intended to diagnose MRSA infection nor to guide or monitor  treatment for MRSA infections.  Culture, respiratory (NON-Expectorated)     Status: None   Collection Time: 09/15/14  5:24 PM  Result Value Ref Range Status   Specimen Description ENDOTRACHEAL  Final   Special Requests Normal  Final   Gram Stain   Final    RARE WBC PRESENT, PREDOMINANTLY PMN MODERATE SQUAMOUS EPITHELIAL CELLS PRESENT FEW YEAST ABUNDANT GRAM NEGATIVE  RODS MODERATE GRAM POSITIVE RODS   Culture   Final    Non-Pathogenic Oropharyngeal-type Flora Isolated. Performed at [facility]   Report Status 09/18/2014 FINAL  Final  Clostridium Difficile by PCR     Status: None   Collection Time: 09/15/14 10:09 PM  Result Value Ref Range Status   Toxigenic C Difficile by pcr NEGATIVE NEGATIVE Final  Urine culture     Status: None   Collection Time: 09/17/14 10:09 AM  Result Value Ref Range Status   Specimen Description URINE, CATHETERIZED  Final   Special Requests NONE  Final   Culture  Setup Time   Final    09/17/2014 14:30 Performed at [place] Performed at [facility]  Final   Culture NO GROWTH Performed at [facility]  Final   Report Status 09/18/2014 FINAL  Final  Culture, blood (routine x 2)     Status: None   Collection Time: 09/17/14 10:56 AM  Result Value Ref Range Status   Specimen Description BLOOD RIGHT ANTECUBITAL  Final   Special Requests   Final    BOTTLES DRAWN AEROBIC AND ANAEROBIC 10CC AER 5CC ANA   Culture  Setup Time   Final    09/17/2014 14:30 Performed at [facility]   Culture   Final    NO GROWTH 5 DAYS Performed at [facility]   Report Status 09/23/2014 FINAL  Final  Culture, blood (routine x 2)     Status: None   Collection Time: 09/17/14 11:10 AM  Result Value Ref Range Status   Specimen Description BLOOD RIGHT FOREARM  Final   Special Requests BOTTLES DRAWN AEROBIC ONLY [facility] [place]  Final   Culture  Setup Time   Final    09/17/2014 14:30 Performed at [facility]   Culture   Final    NO GROWTH 5 DAYS Performed at [facility]   Report Status 09/23/2014 FINAL  Final  Culture, respiratory (NON-Expectorated)     Status: None   Collection Time: 09/17/14 11:27 AM  Result Value Ref Range Status   Specimen Description TRACHEAL ASPIRATE  Final   Special Requests NONE  Final   Gram Stain   Final    NO WBC SEEN RARE SQUAMOUS EPITHELIAL CELLS PRESENT NO ORGANISMS SEEN Performed at [facility]   Culture   Final    Non-Pathogenic Oropharyngeal-type Flora Isolated. Performed at [facility]   Report Status 09/19/2014 FINAL  Final    Coagulation Studies:  Recent Labs  09/08/15 1607  LABPROT 13.4  INR 1.00    Urinalysis:  Recent Labs Lab 09/08/15 1817  COLORURINE YELLOW  LABSPEC 1.038*  PHURINE 6.5  GLUCOSEU NEGATIVE  HGBUR LARGE*  BILIRUBINUR NEGATIVE  KETONESUR NEGATIVE  PROTEINUR 30*  UROBILINOGEN 1.0  NITRITE NEGATIVE  LEUKOCYTESUR SMALL*    Lipid Panel:    Component Value Date/Time   CHOL 231* 09/25/2014 0500   TRIG 110 09/25/2014 0500   HDL 39* 09/25/2014 0500   CHOLHDL 5.9 09/25/2014 0500   VLDL 22 09/25/2014 0500   LDLCALC 170* 09/25/2014 0500    HgbA1C:  Lab Results  Component Value Date   HGBA1C 5.9* 09/25/2014    Urine Drug Screen:     Component Value Date/Time   LABOPIA NONE DETECTED 09/15/2014 2208   COCAINSCRNUR NONE DETECTED 09/15/2014 2208   LABBENZ NONE DETECTED 09/15/2014 2208   AMPHETMU NONE DETECTED 09/15/2014 2208   THCU  POSITIVE* 09/15/2014 2208   LABBARB NONE DETECTED 09/15/2014 2208    Alcohol Level: No results for input(s): ETH in the last 168 hours.  Other results: EKG: normal sinus rhythm at 78 bpm.  Imaging: Dg Chest 2 View  09/08/2015   CLINICAL DATA:  Confusion.  EXAM: CHEST  2 VIEW  COMPARISON:  September 24, 2014.  FINDINGS: The heart size and mediastinal contours are within normal limits. No pneumothorax is noted. Left lung is clear. No  significant pleural effusion is noted. Mild right basilar opacity is noted concerning for subsegmental atelectasis. The visualized skeletal structures are unremarkable.  IMPRESSION: Probable mild right basilar subsegmental atelectasis.   Electronically Signed   By: Marijo Conception, M.D.   On: 09/08/2015 17:43   Ct Head Wo Contrast  09/08/2015   CLINICAL DATA:  Difficulty walking, recent ischemic stroke 9 days ago  EXAM: CT HEAD WITHOUT CONTRAST  TECHNIQUE: Contiguous axial images were obtained from the base of the skull through the vertex without intravenous contrast.  COMPARISON:  10/07/2014  FINDINGS: The bony calvarium is intact. There are changes consistent with prior coiling which are stable from the prior exam. Scattered areas of decreased attenuation are noted consistent with prior white matter ischemic change. No findings to suggest acute hemorrhage, acute infarction or space-occupying mass lesion are noted.  IMPRESSION: Chronic ischemic changes and changes of prior aneurysm coiling. No acute abnormality noted.   Electronically Signed   By: Inez Catalina M.D.   On: 09/08/2015 17:23   Mr Brain Wo Contrast  09/09/2015   CLINICAL DATA:  Initial evaluation for left-sided facial droop.  EXAM: MRI HEAD WITHOUT CONTRAST  MRA HEAD WITHOUT CONTRAST  TECHNIQUE: Multiplanar, multiecho pulse sequences of the brain and surrounding structures were obtained without intravenous contrast. Angiographic images of the head were obtained using MRA technique without contrast.  COMPARISON:  Prior CT from earlier the same day.  FINDINGS: MRI HEAD FINDINGS  There are several scattered small patchy foci of high signal intensity on DWI sequence involving the deep white matter of the bilateral centrum semi ovale, suspicious for possible small acute ischemic changes. There are is probable corresponding signal loss on ADC map seen about several of these foci, although some of these likely reflect T2 shine through, particularly  within the left centrum semi ovale. Few scattered chronic lacunar infarcts present within this region. No associated hemorrhage or mass effect. No other infarct. Normal intravascular flow voids maintained.  Mild to moderate chronic small vessel ischemic changes present within the periventricular and deep white matter. No mass lesion, midline shift, or mass effect. No hydrocephalus. No extra-axial fluid collection. Scattered chronic hemosiderin staining seen about the sylvian fissures bilaterally as well as at the occipital horns of both lateral ventricles, likely related to episode of previous subarachnoid hemorrhage.  Craniocervical junction within normal limits. Mild degenerative changes noted within the partially visualized upper cervical spine. Incidental note made of a partially empty sella.  No acute abnormality about the orbits.  Paranasal sinuses are clear. No mastoid effusion. Inner ear structures grossly normal.  Bone marrow signal intensity within normal limits. Scalp soft tissues unremarkable.  MRA HEAD FINDINGS  ANTERIOR CIRCULATION:  Visualized distal cervical segments of the internal carotid arteries are patent with antegrade flow. Petrous segments widely patent. Cavernous and supraclinoid segments well opacified. A1 segments widely patent. 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 No residual aneurysm appreciated. Anterior cerebral arteries well opacified.  M1 segments patent  without occlusion or high-grade stenosis. Mild atheromatous irregularity within the M1 segments bilaterally. MCA bifurcations normal. Distal MCA branches symmetric.  POSTERIOR CIRCULATION:  Vertebral arteries widely patent to the vertebrobasilar junction. Posterior inferior cerebral arteries well opacified. Basilar artery widely patent. Superior cerebellar arteries opacified proximally. Both the posterior cerebral arteries arise from the basilar artery and are well opacified to their distal  aspects.  No new aneurysm or vascular malformation.  IMPRESSION: MRI HEAD IMPRESSION:  1. Patchy high signal intensity within the deep white matter of the bilateral centrum semi ovale, suspicious for possible small vessel ischemia. Please note that a portion of these findings main reflect T2 shine through/artifact from underlying chronic small vessel ischemic disease. Correlation with symptomatology and physical exam recommended. 2. Mild to moderate chronic small vessel ischemic disease.  MRA HEAD IMPRESSION:  1. No large or proximal arterial branch occlusion identified within the intracranial circulation. No high-grade or correctable stenosis identified. 2. 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 No residual aneurysm identified.   Electronically Signed   By: Jeannine Boga M.D.   On: 09/09/2015 01:38   Ct Abdomen Pelvis W Contrast  09/08/2015   CLINICAL DATA:  Right lower quadrant pain  EXAM: CT ABDOMEN AND PELVIS WITH CONTRAST  TECHNIQUE: Multidetector CT imaging of the abdomen and pelvis was performed using the standard protocol following bolus administration of intravenous contrast.  CONTRAST:  12mL OMNIPAQUE IOHEXOL 300 MG/ML  SOLN  COMPARISON:  None.  FINDINGS: Lower chest and abdominal wall: Large sliding hiatal hernia with retention of oral contrast.  Patchy ground-glass opacity and bandlike opacities in the right lower lobe most consistent with atelectasis.  Extensive coronary atherosclerotic calcification.  Hepatobiliary: No focal liver abnormality.No evidence of biliary obstruction or stone.  Pancreas: Borderline main duct enlargement at 3 mm. No visible mass or inflammation.  Spleen: Unremarkable.  Adrenals/Urinary Tract: Negative adrenals. Left nephrolithiasis, including a 20 mm nonobstructing, branching stone in the pelvis. Smooth urothelial left renal pelvis asymmetric thickening, presumably from irritation. Unremarkable bladder.  Reproductive:Hysterectomy and possible  oophorectomies.  Stomach/Bowel: No obstruction. Appendix not identified. No indication of appendicitis.  Vascular/Lymphatic: Diffuse atherosclerotic calcification. No acute vascular abnormality. No mass or adenopathy.  Peritoneal: No ascites or pneumoperitoneum.  Musculoskeletal: No acute abnormalities. Multilevel facet arthropathy and disc degeneration.  IMPRESSION: 1. No explanation for right lower quadrant pain. 2. Urothelial thickening at the left renal pelvis, presumably related to branching 2 cm calculus. Urinalysis correlation recommended. 3. Right lower lobe atelectasis. 4. Large sliding hiatal hernia.   Electronically Signed   By: Monte Fantasia M.D.   On: 09/08/2015 17:35   Dg Knee Complete 4 Views Right  09/08/2015   CLINICAL DATA:  Confusion.  Shaft wing gait and slurred speech.  EXAM: RIGHT KNEE - COMPLETE 4+ VIEW  COMPARISON:  None  FINDINGS: No joint effusion. No fracture or subluxation. 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 There is no fracture or subluxation identified.  IMPRESSION: 1. No acute findings. 2. Osteoarthritis.   Electronically Signed   By: Kerby Moors M.D.   On: 09/08/2015 17:45   Mr Jodene Nam Head/brain Wo Cm  09/09/2015   CLINICAL DATA:  Initial evaluation for left-sided facial droop.  EXAM: MRI HEAD WITHOUT CONTRAST  MRA HEAD WITHOUT CONTRAST  TECHNIQUE: Multiplanar, multiecho pulse sequences of the brain and surrounding structures were obtained without intravenous contrast. Angiographic images of the head were obtained using MRA technique without contrast.  COMPARISON:  Prior CT from earlier the same day.  FINDINGS: MRI HEAD FINDINGS  There are several scattered small patchy foci of high signal intensity on DWI sequence involving the deep white matter of the bilateral centrum semi ovale, suspicious for possible small acute ischemic changes. There are is probable corresponding signal loss on ADC map seen about several of these foci, although some of these  likely reflect T2 shine through, particularly within the left centrum semi ovale. Few scattered chronic lacunar infarcts present within this region. No associated hemorrhage or mass effect. No other infarct. Normal intravascular flow voids maintained.  Mild to moderate chronic small vessel ischemic changes present within the periventricular and deep white matter. No mass lesion, midline shift, or mass effect. No hydrocephalus. No extra-axial fluid collection. Scattered chronic hemosiderin staining seen about the sylvian fissures bilaterally as well as at the occipital horns of both lateral ventricles, likely related to episode of previous subarachnoid hemorrhage.  Craniocervical junction within normal limits. Mild degenerative changes noted within the partially visualized upper cervical spine. Incidental note made of a partially empty sella.  No acute abnormality about the orbits.  Paranasal sinuses are clear. No mastoid effusion. Inner ear structures grossly normal.  Bone marrow signal intensity within normal limits. Scalp soft tissues unremarkable.  MRA HEAD FINDINGS  ANTERIOR CIRCULATION:  Visualized distal cervical segments of the internal carotid arteries are patent with antegrade flow. Petrous segments widely patent. Cavernous and supraclinoid segments well opacified. A1 segments widely patent. 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 No residual aneurysm appreciated. Anterior cerebral arteries well opacified.  M1 segments patent without occlusion or high-grade stenosis. Mild atheromatous irregularity within the M1 segments bilaterally. MCA bifurcations normal. Distal MCA branches symmetric.  POSTERIOR CIRCULATION:  Vertebral arteries widely patent to the vertebrobasilar junction. Posterior inferior cerebral arteries well opacified. Basilar artery widely patent. Superior cerebellar arteries opacified proximally. Both the posterior cerebral arteries arise from the basilar  artery and are well opacified to their distal aspects.  No new aneurysm or vascular malformation.  IMPRESSION: MRI HEAD IMPRESSION:  1. Patchy high signal intensity within the deep white matter of the bilateral centrum semi ovale, suspicious for possible small vessel ischemia. Please note that a portion of these findings main reflect T2 shine through/artifact from underlying chronic small vessel ischemic disease. Correlation with symptomatology and physical exam recommended. 2. Mild to moderate chronic small vessel ischemic disease.  MRA HEAD IMPRESSION:  1. No large or proximal arterial branch occlusion identified within the intracranial circulation. No high-grade or correctable stenosis identified. 2. 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 No residual aneurysm identified.   Electronically Signed   By: Jeannine Boga M.D.   On: 09/09/2015 01:38   Dg Hip Unilat With Pelvis 2-3 Views Right  09/08/2015   CLINICAL DATA:  Shuffling gait and recent fall with right hip pain, initial encounter  EXAM: DG HIP (WITH OR WITHOUT PELVIS) 2-3V RIGHT  COMPARISON:  None.  FINDINGS: Contrast material is noted within the collecting systems and bladder related to the recent CT examination. The pelvic ring is intact. No acute fracture or dislocation is noted. Mild degenerative changes of the right hip joint are seen. No soft tissue abnormality is noted.  IMPRESSION: No acute abnormality seen.   Electronically Signed   By: Inez Catalina M.D.   On: 09/08/2015 17:43    Assessment: 64 y.o. female presenting with increased confusion, slurred speech and increased difficulty with gait.  Has had recent infarcts which puts her at risk for  further strokes.  MRI of the brain personally reviewed and shows deep white matter diffusion weighted areas. Unclear if they have an ADC correlation.  May represent her recent infarcts.  Lab work shows evidence of infection.  It may be more likely that she has unmasking of symptoms  due to her concurrent infection.    Stroke Risk Factors - hypertension  Plan: 1. Records to be obtained from the [place].   Imaging will be helpful for comparison. 2. Continue ASA 3. Agree with treatment of infection 4. Frequent neuro checks 5. Telemetry monitoring 6. PT/OT consults   Alexis Goodell, MD [facility] [phone] 09/09/2015, 1:45 AM

## 2015-09-09 NOTE — Care Management Note (Signed)
Case Management Note  Patient Details  Name: Mindy Mills MRN: [id number] Date of Birth: 1951-09-10  Subjective/Objective:                    Action/Plan: Pt admitted with falls. Pt is from home with family. CM will continue to follow for discharge needs.   Expected Discharge Date:                  Expected Discharge Plan:  Home/Self Care  In-House Referral:     Discharge planning Services     Post Acute Care Choice:    Choice offered to:     DME Arranged:    DME Agency:     HH Arranged:    HH Agency:     Status of Service:  In process, will continue to follow  Medicare Important Message Given:    Date Medicare IM Given:    Medicare IM give by:    Date Additional Medicare IM Given:    Additional Medicare Important Message give by:     If discussed at [place] of Stay Meetings, dates discussed:    Additional Comments:  Ollen Gross, RN 09/09/2015, 11:04 AM

## 2015-09-09 NOTE — Progress Notes (Signed)
   09/09/15 1350  Clinical Encounter Type  Visited With Patient and family together;Health care provider  Visit Type Initial  Referral From Nurse   Chaplain responded to a request to create an advanced directive, and left information about HCPOA with patient's daughter-in-law. Daughter-in-law was also requesting information about completing a durable power of attorney. Chaplain will follow-up, and is available as needed. Pager - [place], Chaplain 09/09/2015 1:52 PM

## 2015-09-09 NOTE — Evaluation (Signed)
Physical Therapy Evaluation Patient Details Name: Mindy Mills MRN: [id number] DOB: 1951/11/13 Today's Date: 09/09/2015   History of Present Illness  Mindy Mills is an 64 y.o. female who was brought in by he son this evening. It appears that the patient was recently admitted to the [place] (3 weeks ago) and treated for acute infarcts and UTI. Per son patient awakened this morning and was more confused than at baseline. Described as memory gaps and not remembering things that happened. Also patient with slurred speech and shuffling gait. .  Clinical Impression  Pt presenting with above. Pt with decreased insight to deficits, impaired safety awareness, significant balance impairement and R sided weakness. Spoke extensively with son who reports pt falls minimally 2-3x/day and requires assistance getting in/out of the tub. Pt with sedentary lives style, staying in bed 22 hours a day. Pt however currently unsafe to stay at home alone due to both cognitive and functional deficits. Pt is alone during the day as her son and dtr-in-law work. Pt remains at a high risk of falls and would benefit from ST-SNF to maximize functional recovery. Ultimately pt would benefit from an ALF s/p stay in SNF as pt has had recurrent strokes and falls and steady decline in cognitive function. Due to both the cognitive and functional impairment suspect pt will not be safe to return home alone.    Follow Up Recommendations SNF;Supervision/Assistance - 24 hour    Equipment Recommendations  None recommended by PT    Recommendations for Other Services OT consult     Precautions / Restrictions Precautions Precautions: Fall Restrictions Weight Bearing Restrictions: No      Mobility  Bed Mobility Overal bed mobility: Needs Assistance Bed Mobility: Supine to Sit     Supine to sit: Min guard     General bed mobility comments: definite use of hands on bed rail  Transfers Overall transfer level: Needs  assistance Equipment used: Straight cane Transfers: Sit to/from Stand Sit to Stand: Min assist         General transfer comment: pt with posterior lean upon standing requiring bracing against bed and minA from PT to prevent fall  Ambulation/Gait Ambulation/Gait assistance: Min assist;Mod assist Ambulation Distance (Feet): 75 Feet Assistive device: Straight cane Gait Pattern/deviations: Step-through pattern;Decreased stride length;Shuffle;Wide base of support Gait velocity: slow Gait velocity interpretation: Below normal speed for age/gender General Gait Details: pt extremely unsteady and had episodes of LOB x3 requiring mod/maxA to prevent fall. Pt unable to use cane safely and would benefit from RW. Pt did improve with cane safety after max directional v/c's and using in L hand however still had episodes of LOB.  Stairs            Wheelchair Mobility    Modified Rankin (Stroke Patients Only) Modified Rankin (Stroke Patients Only) Pre-Morbid Rankin Score: Moderate disability Modified Rankin: Moderately severe disability     Balance Overall balance assessment: Needs assistance;History of Falls Sitting-balance support: Feet supported;No upper extremity supported Sitting balance-Leahy Scale: Fair     Standing balance support: During functional activity;No upper extremity supported Standing balance-Leahy Scale: Poor Standing balance comment: posterior lean                              Pertinent Vitals/Pain Pain Assessment: No/denies pain    Home Living Family/patient expects to be discharged to:: Private residence Living Arrangements: Children Available Help at Discharge: Family;Available 24 hours/day (son and dtr  in law work during the day) Type of Home: House Home Access: Stairs to enter Entrance Stairs-Rails: Right Entrance Stairs-Number of Steps: 2 Home Layout: One level Home Equipment: [place] - single point;Wheelchair - power;Shower  seat Additional Comments: son reports she does not use and AD despite recommendations to use them and pt has difficulty getting in/out of the tub    Prior Function Level of Independence: Needs assistance   Gait / Transfers Assistance Needed: history of falls, is suppose to be using a cane but per son doesn't  ADL's / Homemaking Assistance Needed: requires assist to get in/out of tub        Hand Dominance   Dominant Hand: Right    Extremity/Trunk Assessment   Upper Extremity Assessment: RUE deficits/detail RUE Deficits / Details: grossly 4/5         Lower Extremity Assessment: RLE deficits/detail RLE Deficits / Details: grossly 4/5    Cervical / Trunk Assessment: Normal  Communication   Communication: No difficulties  Cognition Arousal/Alertness: Awake/alert Behavior During Therapy: Flat affect Overall Cognitive Status: History of cognitive impairments - at baseline       Memory: Decreased short-term memory ("memory lapse")              General Comments General comments (skin integrity, edema, etc.): spoke extensively with son regarding pt's PLOF and home set up    Exercises        Assessment/Plan    PT Assessment Patient needs continued PT services  PT Diagnosis Difficulty walking   PT Problem List Decreased strength;Decreased activity tolerance;Decreased balance;Decreased mobility;Decreased cognition;Decreased safety awareness;Decreased knowledge of use of DME  PT Treatment Interventions DME instruction;Gait training;Stair training;Functional mobility training;Balance training;Therapeutic exercise;Therapeutic activities   PT Goals (Current goals can be found in the Care Plan section) Acute Rehab PT Goals Patient Stated Goal: home PT Goal Formulation: With patient/family Time For Goal Achievement: 09/23/15 Potential to Achieve Goals: Fair    Frequency Min 4X/week   Barriers to discharge Decreased caregiver support pt alone during the day     Co-evaluation               End of Session Equipment Utilized During Treatment: Gait belt Activity Tolerance: Patient tolerated treatment well Patient left: in chair Nurse Communication: Mobility status         Time: 0826-0905 PT Time Calculation (min) (ACUTE ONLY): 39 min   Charges:   PT Evaluation $Initial PT Evaluation Tier I: 1 Procedure PT Treatments $Gait Training: 8-22 mins $Therapeutic Activity: 8-22 mins   PT G CodesKingsley Callander 09/09/2015, 9:18 AM   Kittie Plater, PT, DPT Pager #: [phone] Office #: [phone]

## 2015-09-09 NOTE — Progress Notes (Signed)
[facility] PROGRESS NOTE  Mindy Mills IRC:[id number] DOB: [birth year] DOA: 09/08/2015 PCP: No primary care provider on file. Interim summary: Mindy Mills is a 64 y.o. female with a past medical history of Hypertension; Migraine; Cancer; Hepatitis C; and Stroke presented with  24 hours of confusion Assessment/Plan: 1. Confusion, acute encephalopathy: unclear if this is her baseline dementia vs from the recent stroke.  - she is alert and oriented to person and place only. She is being worked up for TIA. Work up in progress.  Neurology consulted and awaiting for records from [place].  Resume aspirin.   H/o UTI last week, with abnormal UA; as per family at bedside, she received 7 days of IV rocephin at [facility] center.  Recommend stopping the rocephin and await urine cultures.    Dehydration:  Hydrate and monitor.   Chronic systolic heart failure:  Appears to be compensated.  Repeat echo ordered.    Leukocytosis: Low grade temp of 99 yesterday.  Blood cultures,  Repeat CBC.   Code Status: FULL CODE. Family Communication:  Family at bedside.  Disposition Plan:pending PT EVAL.     Consultants: Neurology.   Procedures:  none  Antibiotics:  Rocephin  HPI/Subjective:denies any new complaints.   Objective: Filed Vitals:   09/09/15 1420  BP: 124/68  Pulse: 80  Temp: 98.1 F (36.7 C)  Resp: 18    Intake/Output Summary (Last 24 hours) at 09/09/15 1752 Last data filed at 09/09/15 0900  Gross per 24 hour  Intake    240 ml  Output      0 ml  Net    240 ml   Filed Weights   09/09/15 0046  Weight: 67.2 kg (148 lb 2.4 oz)    Exam:   General:  Alert afebrile confused  Cardiovascular: s1s2  Respiratory: ctab, no wheezing  Or rhonchi  Abdomen:soft NT nd bs+  Musculoskeletal: no pedal edema.   Data Reviewed: Basic Metabolic Panel:  Recent Labs Lab 09/08/15 1558 09/08/15 1607  NA 143 140  K 4.1 4.0  CL 108 108  CO2  --  21*  GLUCOSE 107* 106*   BUN 19 16  CREATININE 1.00 1.01*  CALCIUM  --  8.9   Liver Function Tests:  Recent Labs Lab 09/08/15 1607  AST 21  ALT 21  ALKPHOS 86  BILITOT 0.6  PROT 6.9  ALBUMIN 3.7   No results for input(s): LIPASE, AMYLASE in the last 168 hours. No results for input(s): AMMONIA in the last 168 hours. CBC:  Recent Labs Lab 09/08/15 1558 09/08/15 1607  WBC  --  20.9*  NEUTROABS  --  17.9*  HGB 16.0* 15.1*  HCT 47.0* 45.1  MCV  --  88.6  PLT  --  222   Cardiac Enzymes: No results for input(s): CKTOTAL, CKMB, CKMBINDEX, TROPONINI in the last 168 hours. BNP (last 3 results) No results for input(s): BNP in the last 8760 hours.  ProBNP (last 3 results)  Recent Labs  09/11/14 2030  PROBNP 3137.0*    CBG:  Recent Labs Lab 09/08/15 1555  GLUCAP 115*    Recent Results (from the past 240 hour(s))  Blood culture (routine x 2)     Status: None (Preliminary result)   Collection Time: 09/08/15  6:00 PM  Result Value Ref Range Status   Specimen Description BLOOD LEFT HAND  Final   Special Requests BOTTLES DRAWN AEROBIC AND ANAEROBIC 4CC  Final   Culture NO GROWTH < 24 HOURS  Final  Report Status PENDING  Incomplete  Urine culture     Status: None (Preliminary result)   Collection Time: 09/08/15  6:17 PM  Result Value Ref Range Status   Specimen Description URINE, CLEAN CATCH  Final   Special Requests NONE  Final   Culture CULTURE REINCUBATED FOR BETTER GROWTH  Final   Report Status PENDING  Incomplete     Studies: Dg Chest 2 View  09/08/2015   CLINICAL DATA:  Confusion.  EXAM: CHEST  2 VIEW  COMPARISON:  September 24, 2014.  FINDINGS: The heart size and mediastinal contours are within normal limits. No pneumothorax is noted. Left lung is clear. No significant pleural effusion is noted. Mild right basilar opacity is noted concerning for subsegmental atelectasis. The visualized skeletal structures are unremarkable.  IMPRESSION: Probable mild right basilar subsegmental  atelectasis.   Electronically Signed   By: Marijo Conception, M.D.   On: 09/08/2015 17:43   Ct Head Wo Contrast  09/08/2015   CLINICAL DATA:  Difficulty walking, recent ischemic stroke 9 days ago  EXAM: CT HEAD WITHOUT CONTRAST  TECHNIQUE: Contiguous axial images were obtained from the base of the skull through the vertex without intravenous contrast.  COMPARISON:  10/07/2014  FINDINGS: The bony calvarium is intact. There are changes consistent with prior coiling which are stable from the prior exam. Scattered areas of decreased attenuation are noted consistent with prior white matter ischemic change. No findings to suggest acute hemorrhage, acute infarction or space-occupying mass lesion are noted.  IMPRESSION: Chronic ischemic changes and changes of prior aneurysm coiling. No acute abnormality noted.   Electronically Signed   By: Inez Catalina M.D.   On: 09/08/2015 17:23   Mr Brain Wo Contrast  09/09/2015   ADDENDUM REPORT: 09/09/2015 07:46  ADDENDUM: In addition to the initially described findings, these scattered foci of high signal intensity seen on DWI sequence within the deep white matter of both cerebral hemispheres could potentially reflect subacute infarcts as well, as no definite ADC correlate is seen, although many of these foci are so small that a discrete ADC correlate would be difficult to discern. Correlation with history and comparison with prior imaging if available may be helpful in this regard.   Electronically Signed   By: Jeannine Boga M.D.   On: 09/09/2015 07:46   09/09/2015   CLINICAL DATA:  Initial evaluation for left-sided facial droop.  EXAM: MRI HEAD WITHOUT CONTRAST  MRA HEAD WITHOUT CONTRAST  TECHNIQUE: Multiplanar, multiecho pulse sequences of the brain and surrounding structures were obtained without intravenous contrast. Angiographic images of the head were obtained using MRA technique without contrast.  COMPARISON:  Prior CT from earlier the same day.  FINDINGS: MRI HEAD  FINDINGS  There are several scattered small patchy foci of high signal intensity on DWI sequence involving the deep white matter of the bilateral centrum semi ovale, suspicious for possible small acute ischemic changes. There are is probable corresponding signal loss on ADC map seen about several of these foci, although some of these likely reflect T2 shine through, particularly within the left centrum semi ovale. Few scattered chronic lacunar infarcts present within this region. No associated hemorrhage or mass effect. No other infarct. Normal intravascular flow voids maintained.  Mild to moderate chronic small vessel ischemic changes present within the periventricular and deep white matter. No mass lesion, midline shift, or mass effect. No hydrocephalus. No extra-axial fluid collection. Scattered chronic hemosiderin staining seen about the sylvian fissures bilaterally as well as at  the occipital horns of both lateral ventricles, likely related to episode of previous subarachnoid hemorrhage.  Craniocervical junction within normal limits. Mild degenerative changes noted within the partially visualized upper cervical spine. Incidental note made of a partially empty sella.  No acute abnormality about the orbits.  Paranasal sinuses are clear. No mastoid effusion. Inner ear structures grossly normal.  Bone marrow signal intensity within normal limits. Scalp soft tissues unremarkable.  MRA HEAD FINDINGS  ANTERIOR CIRCULATION:  Visualized distal cervical segments of the internal carotid arteries are patent with antegrade flow. Petrous segments widely patent. Cavernous and supraclinoid segments well opacified. A1 segments widely patent. 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 No residual aneurysm appreciated. Anterior cerebral arteries well opacified.  M1 segments patent without occlusion or high-grade stenosis. Mild atheromatous irregularity within the M1 segments bilaterally. MCA  bifurcations normal. Distal MCA branches symmetric.  POSTERIOR CIRCULATION:  Vertebral arteries widely patent to the vertebrobasilar junction. Posterior inferior cerebral arteries well opacified. Basilar artery widely patent. Superior cerebellar arteries opacified proximally. Both the posterior cerebral arteries arise from the basilar artery and are well opacified to their distal aspects.  No new aneurysm or vascular malformation.  IMPRESSION: MRI HEAD IMPRESSION:  1. Patchy high signal intensity within the deep white matter of the bilateral centrum semi ovale, suspicious for possible small vessel ischemia. Please note that a portion of these findings main reflect T2 shine through/artifact from underlying chronic small vessel ischemic disease. Correlation with symptomatology and physical exam recommended. 2. Mild to moderate chronic small vessel ischemic disease.  MRA HEAD IMPRESSION:  1. No large or proximal arterial branch occlusion identified within the intracranial circulation. No high-grade or correctable stenosis identified. 2. 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 No residual aneurysm identified.  Electronically Signed: By: Jeannine Boga M.D. On: 09/09/2015 01:38   Ct Abdomen Pelvis W Contrast  09/08/2015   CLINICAL DATA:  Right lower quadrant pain  EXAM: CT ABDOMEN AND PELVIS WITH CONTRAST  TECHNIQUE: Multidetector CT imaging of the abdomen and pelvis was performed using the standard protocol following bolus administration of intravenous contrast.  CONTRAST:  169mL OMNIPAQUE IOHEXOL 300 MG/ML  SOLN  COMPARISON:  None.  FINDINGS: Lower chest and abdominal wall: Large sliding hiatal hernia with retention of oral contrast.  Patchy ground-glass opacity and bandlike opacities in the right lower lobe most consistent with atelectasis.  Extensive coronary atherosclerotic calcification.  Hepatobiliary: No focal liver abnormality.No evidence of biliary obstruction or stone.  Pancreas:  Borderline main duct enlargement at 3 mm. No visible mass or inflammation.  Spleen: Unremarkable.  Adrenals/Urinary Tract: Negative adrenals. Left nephrolithiasis, including a 20 mm nonobstructing, branching stone in the pelvis. Smooth urothelial left renal pelvis asymmetric thickening, presumably from irritation. Unremarkable bladder.  Reproductive:Hysterectomy and possible oophorectomies.  Stomach/Bowel: No obstruction. Appendix not identified. No indication of appendicitis.  Vascular/Lymphatic: Diffuse atherosclerotic calcification. No acute vascular abnormality. No mass or adenopathy.  Peritoneal: No ascites or pneumoperitoneum.  Musculoskeletal: No acute abnormalities. Multilevel facet arthropathy and disc degeneration.  IMPRESSION: 1. No explanation for right lower quadrant pain. 2. Urothelial thickening at the left renal pelvis, presumably related to branching 2 cm calculus. Urinalysis correlation recommended. 3. Right lower lobe atelectasis. 4. Large sliding hiatal hernia.   Electronically Signed   By: Monte Fantasia M.D.   On: 09/08/2015 17:35   Dg Knee Complete 4 Views Right  09/08/2015   CLINICAL DATA:  Confusion.  Shaft wing gait and slurred speech.  EXAM: RIGHT KNEE - COMPLETE 4+ VIEW  COMPARISON:  None  FINDINGS: No joint effusion. No fracture or subluxation. 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 There is no fracture or subluxation identified.  IMPRESSION: 1. No acute findings. 2. Osteoarthritis.   Electronically Signed   By: Kerby Moors M.D.   On: 09/08/2015 17:45   Mr Jodene Nam Head/brain Wo Cm  09/09/2015   ADDENDUM REPORT: 09/09/2015 07:46  ADDENDUM: In addition to the initially described findings, these scattered foci of high signal intensity seen on DWI sequence within the deep white matter of both cerebral hemispheres could potentially reflect subacute infarcts as well, as no definite ADC correlate is seen, although many of these foci are so small that a discrete ADC  correlate would be difficult to discern. Correlation with history and comparison with prior imaging if available may be helpful in this regard.   Electronically Signed   By: Jeannine Boga M.D.   On: 09/09/2015 07:46   09/09/2015   CLINICAL DATA:  Initial evaluation for left-sided facial droop.  EXAM: MRI HEAD WITHOUT CONTRAST  MRA HEAD WITHOUT CONTRAST  TECHNIQUE: Multiplanar, multiecho pulse sequences of the brain and surrounding structures were obtained without intravenous contrast. Angiographic images of the head were obtained using MRA technique without contrast.  COMPARISON:  Prior CT from earlier the same day.  FINDINGS: MRI HEAD FINDINGS  There are several scattered small patchy foci of high signal intensity on DWI sequence involving the deep white matter of the bilateral centrum semi ovale, suspicious for possible small acute ischemic changes. There are is probable corresponding signal loss on ADC map seen about several of these foci, although some of these likely reflect T2 shine through, particularly within the left centrum semi ovale. Few scattered chronic lacunar infarcts present within this region. No associated hemorrhage or mass effect. No other infarct. Normal intravascular flow voids maintained.  Mild to moderate chronic small vessel ischemic changes present within the periventricular and deep white matter. No mass lesion, midline shift, or mass effect. No hydrocephalus. No extra-axial fluid collection. Scattered chronic hemosiderin staining seen about the sylvian fissures bilaterally as well as at the occipital horns of both lateral ventricles, likely related to episode of previous subarachnoid hemorrhage.  Craniocervical junction within normal limits. Mild degenerative changes noted within the partially visualized upper cervical spine. Incidental note made of a partially empty sella.  No acute abnormality about the orbits.  Paranasal sinuses are clear. No mastoid effusion. Inner ear  structures grossly normal.  Bone marrow signal intensity within normal limits. Scalp soft tissues unremarkable.  MRA HEAD FINDINGS  ANTERIOR CIRCULATION:  Visualized distal cervical segments of the internal carotid arteries are patent with antegrade flow. Petrous segments widely patent. Cavernous and supraclinoid segments well opacified. A1 segments widely patent. 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 No residual aneurysm appreciated. Anterior cerebral arteries well opacified.  M1 segments patent without occlusion or high-grade stenosis. Mild atheromatous irregularity within the M1 segments bilaterally. MCA bifurcations normal. Distal MCA branches symmetric.  POSTERIOR CIRCULATION:  Vertebral arteries widely patent to the vertebrobasilar junction. Posterior inferior cerebral arteries well opacified. Basilar artery widely patent. Superior cerebellar arteries opacified proximally. Both the posterior cerebral arteries arise from the basilar artery and are well opacified to their distal aspects.  No new aneurysm or vascular malformation.  IMPRESSION: MRI HEAD IMPRESSION:  1. Patchy high signal intensity within the deep white matter of the bilateral centrum semi ovale, suspicious for possible  small vessel ischemia. Please note that a portion of these findings main reflect T2 shine through/artifact from underlying chronic small vessel ischemic disease. Correlation with symptomatology and physical exam recommended. 2. Mild to moderate chronic small vessel ischemic disease.  MRA HEAD IMPRESSION:  1. No large or proximal arterial branch occlusion identified within the intracranial circulation. No high-grade or correctable stenosis identified. 2. 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 No residual aneurysm identified.  Electronically Signed: By: Jeannine Boga M.D. On: 09/09/2015 01:38   Dg Hip Unilat With Pelvis 2-3 Views Right  09/08/2015   CLINICAL DATA:   Shuffling gait and recent fall with right hip pain, initial encounter  EXAM: DG HIP (WITH OR WITHOUT PELVIS) 2-3V RIGHT  COMPARISON:  None.  FINDINGS: Contrast material is noted within the collecting systems and bladder related to the recent CT examination. The pelvic ring is intact. No acute fracture or dislocation is noted. Mild degenerative changes of the right hip joint are seen. No soft tissue abnormality is noted.  IMPRESSION: No acute abnormality seen.   Electronically Signed   By: Inez Catalina M.D.   On: 09/08/2015 17:43    Scheduled Meds: . aspirin  300 mg Rectal Daily   Or  . aspirin  325 mg Oral Daily  . atorvastatin  20 mg Oral q1800  . cefTRIAXone (ROCEPHIN)  IV  1 g Intravenous Q24H  . clonazePAM  0.5 mg Oral BID  . feeding supplement (ENSURE ENLIVE)  237 mL Oral Q24H  . [START ON 09/10/2015] Influenza vac split quadrivalent PF  0.5 mL Intramuscular Tomorrow-1000  . levETIRAcetam  500 mg Oral TID  . mirtazapine  15 mg Oral QHS  . pantoprazole  40 mg Oral QHS  . predniSONE  10 mg Oral Q breakfast  . topiramate  50 mg Oral QHS  . traZODone  50 mg Oral QHS  . venlafaxine XR  225 mg Oral Q breakfast   Continuous Infusions: . sodium chloride 75 mL/hr at 09/09/15 0112    Active Problems:   Fall   UTI (lower urinary tract infection)   Dehydration   Sepsis   Systolic heart failure   Confusion   TIA (transient ischemic attack)    Time spent: 35 min.     [facility] Pager [phone] If 7PM-7AM, please contact night-coverage at [email or web address], password [facility] 09/09/2015, 5:52 PM  LOS: 1 day

## 2015-09-09 NOTE — Progress Notes (Signed)
Initial Nutrition Assessment   INTERVENTION:  Provide Ensure Enlive po once daily, each supplement provides 350 kcal and 20 grams of protein Change diet to Dysphagia 3 (Mechanical Soft) Provide handouts with lists of easy to chew healthful foods   NUTRITION DIAGNOSIS:   Inadequate oral intake related to other (see comment) (edentulous/poor-fitting dentures) as evidenced by per patient/family report, mild depletion of body fat, mild depletion of muscle mass.   GOAL:   Patient will meet greater than or equal to 90% of their needs   MONITOR:   PO intake, Supplement acceptance, Weight trends, Labs  REASON FOR ASSESSMENT:   Malnutrition Screening Tool    ASSESSMENT:   64 y.o. female with a past medical history of Hypertension, Migraines, Cancer, Hepatitis C, and Stroke. Presents with 24 hours of confusion.  Pt states that in December of 2015 she had teeth pulled and received dentures but, dentures do not fit well therefore she only eats soft foods. She reports eating poorly since due to limited food selection; she occasionally drinks Boost/Ensure. She reports eating oatmeal for breakfast and a meat/vegetable/starch that her daughter-in-law prepares for dinner. She is unsure how much she has lost. She reports having a good appetite and eating 80-100% of meals since admission. Weight history shows that patient has actually gained from 126 lbs to 148 lbs in the past year.  RD provided "Nutrition Therapy for Difficulty Eating" handout and "Soft Foods Nutrition Therapy" handout from the Academy of [employer]etics with food lists by food group of recommended easy to chew foods, and sample menus.   Labs reviewed.   Diet Order:  DIET SOFT Room service appropriate?: Yes; Fluid consistency:: Thin  Skin:  Reviewed, no issues  Last BM:  9/10  Height:   Ht Readings from Last 1 Encounters:  09/09/15 5\' 3"  (1.6 m)    Weight:   Wt Readings from Last 1 Encounters:  09/09/15  148 lb 2.4 oz (67.2 kg)    Ideal Body Weight:  52.3 kg  BMI:  Body mass index is 26.25 kg/(m^2).  Estimated Nutritional Needs:   Kcal:  1600-1800  Protein:  70-80 grams  Fluid:  1.6-1.8 L/day  EDUCATION NEEDS:   Education needs addressed  Scarlette Ar RD, LDN Inpatient Clinical Dietitian Pager: [phone] After Hours Pager: [phone]

## 2015-09-10 ENCOUNTER — Ambulatory Visit (HOSPITAL_COMMUNITY): Payer: Medicare Other

## 2015-09-10 DIAGNOSIS — G459 Transient cerebral ischemic attack, unspecified: Secondary | ICD-10-CM

## 2015-09-10 DIAGNOSIS — E860 Dehydration: Secondary | ICD-10-CM

## 2015-09-10 DIAGNOSIS — I5022 Chronic systolic (congestive) heart failure: Secondary | ICD-10-CM

## 2015-09-10 DIAGNOSIS — R4182 Altered mental status, unspecified: Secondary | ICD-10-CM

## 2015-09-10 DIAGNOSIS — N390 Urinary tract infection, site not specified: Secondary | ICD-10-CM

## 2015-09-10 DIAGNOSIS — A419 Sepsis, unspecified organism: Secondary | ICD-10-CM | POA: Diagnosis not present

## 2015-09-10 LAB — BASIC METABOLIC PANEL
ANION GAP: 13 (ref 5–15)
BUN: 26 mg/dL — ABNORMAL HIGH (ref 6–20)
CHLORIDE: 103 mmol/L (ref 101–111)
CO2: 20 mmol/L — AB (ref 22–32)
Calcium: 8.2 mg/dL — ABNORMAL LOW (ref 8.9–10.3)
Creatinine, Ser: 1.19 mg/dL — ABNORMAL HIGH (ref 0.44–1.00)
GFR calc non Af Amer: 48 mL/min — ABNORMAL LOW (ref >60)
GFR, EST AFRICAN AMERICAN: 55 mL/min — AB (ref >60)
Glucose, Bld: 215 mg/dL — ABNORMAL HIGH (ref 65–99)
Potassium: 3.5 mmol/L (ref 3.5–5.1)
Sodium: 136 mmol/L (ref 135–145)

## 2015-09-10 LAB — CBC
HEMATOCRIT: 40.4 % (ref 36.0–46.0)
HEMOGLOBIN: 12.8 g/dL (ref 12.0–15.0)
MCH: 28.4 pg (ref 26.0–34.0)
MCHC: 31.7 g/dL (ref 30.0–36.0)
MCV: 89.6 fL (ref 78.0–100.0)
Platelets: 203 10*3/uL (ref 150–400)
RBC: 4.51 MIL/uL (ref 3.87–5.11)
RDW: 13.2 % (ref 11.5–15.5)
WBC: 14.7 10*3/uL — AB (ref 4.0–10.5)

## 2015-09-10 LAB — HEMOGLOBIN A1C
Hgb A1c MFr Bld: 5.7 % — ABNORMAL HIGH (ref 4.8–5.6)
MEAN PLASMA GLUCOSE: 117 mg/dL

## 2015-09-10 LAB — URINE CULTURE

## 2015-09-10 NOTE — Clinical Social Work Note (Signed)
Clinical Social Worker contacted pt's daughter-in-law and presented bed offers for SNF placement. Pt's dtr-in-law stated she will review bed offers with her husband (pt's son) and contact CSW once decision is made.  Pt's dtr-in-law also made inquiry of durable power of attorney and CSW advised pt's family to contact an attorney to assist with process.   CSW will continue to follow pt and pt's family for continued support and to facilitate pt's discharge needs once medically stable.   FL-2 on chart for MD signature.  Glendon Axe, MSW, LCSWA [phone] 09/10/2015 12:28 PM

## 2015-09-10 NOTE — Progress Notes (Signed)
  Echocardiogram 2D Echocardiogram has been performed.  Jennette Dubin 09/10/2015, 4:12 PM

## 2015-09-10 NOTE — Progress Notes (Signed)
OT Cancellation Note  Patient Details Name: Mindy Mills MRN: [id number] DOB: March 23, 1951   Cancelled Treatment:    Reason Eval/Treat Not Completed: Other (comment) Pt is Medicare and current D/C plan is SNF. No apparent immediate acute care OT needs, therefore will defer OT to SNF. If OT eval is needed please call Acute Rehab Dept. at [phone] or text page OT at [phone].    Almon Register [id number] 09/10/2015, 7:34 AM

## 2015-09-10 NOTE — Progress Notes (Signed)
Physical Therapy Treatment Patient Details Name: Mindy Mills MRN: [id number] DOB: 06-26-1951 Today's Date: 09/10/2015    History of Present Illness Mindy Mills is an 64 y.o. female who was brought in by he son this evening. It appears that the patient was recently admitted to the [place] (3 weeks ago) and treated for acute infarcts and UTI. Per son patient awakened this morning and was more confused than at baseline. Described as memory gaps and not remembering things that happened. Also patient with slurred speech and shuffling gait. .    PT Comments    Pt remains to be at significant falls risk as indicated by DGI score of 15. Pt con't with deceased insight to deficits, decreased safety awareness, and memory impairment. Pt also with R LE weakness limiting ambulation tolerance and independence. Cont' to recommend SNF upon d/c to achieve safe mod I level of function.   Follow Up Recommendations  SNF;Supervision/Assistance - 24 hour     Equipment Recommendations  None recommended by PT    Recommendations for Other Services       Precautions / Restrictions Precautions Precautions: Fall Restrictions Weight Bearing Restrictions: No    Mobility  Bed Mobility Overal bed mobility: Needs Assistance Bed Mobility: Supine to Sit     Supine to sit: Supervision     General bed mobility comments: definite use of hands, increased time  Transfers Overall transfer level: Needs assistance Equipment used: Rolling walker (2 wheeled);None Transfers: Sit to/from Stand Sit to Stand: Min guard         General transfer comment: pt with improved standing ability but remains to have ant/posterior sway but didn not have to brace on bed b/c she had RW  Ambulation/Gait Ambulation/Gait assistance: Min guard;Min assist Ambulation Distance (Feet): 150 Feet Assistive device: Rolling walker (2 wheeled);None Gait Pattern/deviations: Step-through pattern;Decreased stride length;Shuffle  (decreased R LE step height) Gait velocity: slow Gait velocity interpretation: Below normal speed for age/gender General Gait Details: pt ambulated 75 without AD and 75 with RW. pt with increased stability with RW but still had 2 episodes of  LOB requiring minA. When amb without AD pt with increased episodes of LOB due to inability to clear R foot and tripping over it.requiring minA to maintain balance. pt also with L/R lateral sway   Stairs            Wheelchair Mobility    Modified Rankin (Stroke Patients Only) Modified Rankin (Stroke Patients Only) Pre-Morbid Rankin Score: Moderate disability Modified Rankin: Moderate disability     Balance Overall balance assessment: Needs assistance Sitting-balance support: Feet supported Sitting balance-Leahy Scale: Fair     Standing balance support: During functional activity Standing balance-Leahy Scale: Poor Standing balance comment: lateral sway, ant/posterior sway                    Cognition Arousal/Alertness: Awake/alert Behavior During Therapy: Flat affect Overall Cognitive Status: History of cognitive impairments - at baseline       Memory: Decreased short-term memory              Exercises      General Comments General comments (skin integrity, edema, etc.): spoke with patient regarding home situation and her frustration. Pt report of her daily activities inconsistent with son's report. unsure of patience accuracy      Pertinent Vitals/Pain Pain Assessment: No/denies pain    Home Living  Prior Function            PT Goals (current goals can now be found in the care plan section) Acute Rehab PT Goals Patient Stated Goal: home Progress towards PT goals: Progressing toward goals    Frequency  Min 3X/week    PT Plan Frequency needs to be updated    Co-evaluation             End of Session Equipment Utilized During Treatment: Gait belt Activity Tolerance:  Patient tolerated treatment well Patient left: in chair     Time: 6712-4580 PT Time Calculation (min) (ACUTE ONLY): 26 min  Charges:  $Gait Training: 23-37 mins                    G Codes:      Kingsley Callander 09/10/2015, 10:29 AM   Kittie Plater, PT, DPT Pager #: [phone] Office #: [phone]

## 2015-09-10 NOTE — Progress Notes (Signed)
PATIENT DETAILS Name: Mindy Mills Age: 64 y.o. Sex: female Date of Birth: 08/27/1951 Admit Date: 09/08/2015 Admitting Physician Toy Baker, MD PCP:No primary care provider on file.  Subjective: Awake and alert.  Assessment/Plan: Active Problems: Acute encephalopathy: Suspect secondary to UTI, resolved with IV antibiotics. Currently awake and alert. Doubt CVA/TIA, neurology following-await further recommendations.  UTI: Continue IV Rocephin, encephalopathy resolved. Remains afebrile and without leukocytosis. Await urine cultures  Dehydration: Resolved, stop IV fluids  Prior history of ? Stress cardiomyopathy with history of chronic systolic heart failure: Repeat echo. Clinically compensated.  History of seizures: Continue Keppra at seizure-free.  Discharge migraine headaches: Stable, continue with Topamax.  History of depression/anxiety: Stable continue with Effexor and trazodone. Also on Klonopin.  Dyslipidemia: Continue with statin.  Prior history of subarachnoid hemorrhage-s/p coiling 2015.  Disposition: Remain inpatient-SNF on discharge  Antimicrobial agents  See below  Anti-infectives    Start     Dose/Rate Route Frequency Ordered Stop   09/09/15 2000  cefTRIAXone (ROCEPHIN) 1 g in dextrose 5 % 50 mL IVPB     1 g 100 mL/hr over 30 Minutes Intravenous Every 24 hours 09/08/15 2257     09/08/15 1830  cefTRIAXone (ROCEPHIN) 1 g in dextrose 5 % 50 mL IVPB     1 g 100 mL/hr over 30 Minutes Intravenous  Once 09/08/15 1821 09/08/15 1947      DVT Prophylaxis: SCD's  Code Status: Full code   Family Communication None at bedside  Procedures: None  CONSULTS:  neurology  Time spent 30 minutes-Greater than 50% of this time was spent in counseling, explanation of diagnosis, planning of further management, and coordination of care.  MEDICATIONS: Scheduled Meds: . aspirin  300 mg Rectal Daily   Or  . aspirin  325 mg Oral Daily    . atorvastatin  20 mg Oral q1800  . cefTRIAXone (ROCEPHIN)  IV  1 g Intravenous Q24H  . clonazePAM  0.5 mg Oral BID  . feeding supplement (ENSURE ENLIVE)  237 mL Oral Q24H  . levETIRAcetam  500 mg Oral TID  . mirtazapine  15 mg Oral QHS  . pantoprazole  40 mg Oral QHS  . predniSONE  10 mg Oral Q breakfast  . topiramate  50 mg Oral QHS  . traZODone  50 mg Oral QHS  . venlafaxine XR  225 mg Oral Q breakfast   Continuous Infusions: . sodium chloride 75 mL/hr at 09/09/15 0112   PRN Meds:.acetaminophen **OR** acetaminophen, senna-docusate, traMADol, traZODone    PHYSICAL EXAM: Vital signs in last 24 hours: Filed Vitals:   09/09/15 2115 09/10/15 0214 09/10/15 0631 09/10/15 1016  BP: 135/67 121/64 141/76 132/67  Pulse: 83 85 90 96  Temp: 98.5 F (36.9 C) 98.3 F (36.8 C) 98.9 F (37.2 C) 99.5 F (37.5 C)  TempSrc: Oral Oral Oral Oral  Resp: 18 16 18 20   Height:      Weight:      SpO2: 99% 95% 94% 97%    Weight change:  Filed Weights   09/09/15 0046  Weight: 67.2 kg (148 lb 2.4 oz)   Body mass index is 26.25 kg/(m^2).   Gen Exam: Awake and alert with clear speech.  Neck: Supple, No JVD.   Chest: B/L Clear.   CVS: S1 S2 Regular, no murmurs. Abdomen: soft, BS +, non tender, non distended.  Extremities: no edema, lower extremities warm to touch Neurologic:  Non Focal.   Skin: No Rash.   Wounds: N/A.    Intake/Output from previous day:  Intake/Output Summary (Last 24 hours) at 09/10/15 1231 Last data filed at 09/10/15 0900  Gross per 24 hour  Intake    240 ml  Output      2 ml  Net    238 ml     LAB RESULTS: CBC  Recent Labs Lab 09/08/15 1558 09/08/15 1607 09/10/15 0624  WBC  --  20.9* 14.7*  HGB 16.0* 15.1* 12.8  HCT 47.0* 45.1 40.4  PLT  --  222 203  MCV  --  88.6 89.6  MCH  --  29.7 28.4  MCHC  --  33.5 31.7  RDW  --  13.2 13.2  LYMPHSABS  --  1.7  --   MONOABS  --  1.3*  --   EOSABS  --  0.0  --   BASOSABS  --  0.0  --     Chemistries    Recent Labs Lab 09/08/15 1558 09/08/15 1607 09/10/15 0940  NA 143 140 136  K 4.1 4.0 3.5  CL 108 108 103  CO2  --  21* 20*  GLUCOSE 107* 106* 215*  BUN 19 16 26*  CREATININE 1.00 1.01* 1.19*  CALCIUM  --  8.9 8.2*    CBG:  Recent Labs Lab 09/08/15 1555  GLUCAP 115*    GFR Estimated Creatinine Clearance: 44.5 mL/min (by C-G formula based on Cr of 1.19).  Coagulation profile  Recent Labs Lab 09/08/15 1607  INR 1.00    Cardiac Enzymes No results for input(s): CKMB, TROPONINI, MYOGLOBIN in the last 168 hours.  Invalid input(s): CK  Invalid input(s): POCBNP No results for input(s): DDIMER in the last 72 hours.  Recent Labs  09/09/15 0532  HGBA1C 5.7*    Recent Labs  09/09/15 0532  CHOL 159  HDL 52  LDLCALC 89  TRIG 90  CHOLHDL 3.1   No results for input(s): TSH, T4TOTAL, T3FREE, THYROIDAB in the last 72 hours.  Invalid input(s): FREET3 No results for input(s): VITAMINB12, FOLATE, FERRITIN, TIBC, IRON, RETICCTPCT in the last 72 hours. No results for input(s): LIPASE, AMYLASE in the last 72 hours.  Urine Studies No results for input(s): UHGB, CRYS in the last 72 hours.  Invalid input(s): UACOL, UAPR, USPG, UPH, UTP, UGL, UKET, UBIL, UNIT, UROB, ULEU, UEPI, UWBC, URBC, UBAC, CAST, UCOM, BILUA  MICROBIOLOGY: Recent Results (from the past 240 hour(s))  Blood culture (routine x 2)     Status: None (Preliminary result)   Collection Time: 09/08/15  6:00 PM  Result Value Ref Range Status   Specimen Description BLOOD LEFT HAND  Final   Special Requests BOTTLES DRAWN AEROBIC AND ANAEROBIC 4CC  Final   Culture NO GROWTH < 24 HOURS  Final   Report Status PENDING  Incomplete  Urine culture     Status: None (Preliminary result)   Collection Time: 09/08/15  6:17 PM  Result Value Ref Range Status   Specimen Description URINE, CLEAN CATCH  Final   Special Requests NONE  Final   Culture CULTURE REINCUBATED FOR BETTER GROWTH  Final   Report Status PENDING   Incomplete    RADIOLOGY STUDIES/RESULTS: Dg Chest 2 View  09/08/2015   CLINICAL DATA:  Confusion.  EXAM: CHEST  2 VIEW  COMPARISON:  September 24, 2014.  FINDINGS: The heart size and mediastinal contours are within normal limits. No pneumothorax is noted. Left lung is clear. No significant  pleural effusion is noted. Mild right basilar opacity is noted concerning for subsegmental atelectasis. The visualized skeletal structures are unremarkable.  IMPRESSION: Probable mild right basilar subsegmental atelectasis.   Electronically Signed   By: Marijo Conception, M.D.   On: 09/08/2015 17:43   Ct Head Wo Contrast  09/08/2015   CLINICAL DATA:  Difficulty walking, recent ischemic stroke 9 days ago  EXAM: CT HEAD WITHOUT CONTRAST  TECHNIQUE: Contiguous axial images were obtained from the base of the skull through the vertex without intravenous contrast.  COMPARISON:  10/07/2014  FINDINGS: The bony calvarium is intact. There are changes consistent with prior coiling which are stable from the prior exam. Scattered areas of decreased attenuation are noted consistent with prior white matter ischemic change. No findings to suggest acute hemorrhage, acute infarction or space-occupying mass lesion are noted.  IMPRESSION: Chronic ischemic changes and changes of prior aneurysm coiling. No acute abnormality noted.   Electronically Signed   By: Inez Catalina M.D.   On: 09/08/2015 17:23   Mr Brain Wo Contrast  09/09/2015   ADDENDUM REPORT: 09/09/2015 07:46  ADDENDUM: In addition to the initially described findings, these scattered foci of high signal intensity seen on DWI sequence within the deep white matter of both cerebral hemispheres could potentially reflect subacute infarcts as well, as no definite ADC correlate is seen, although many of these foci are so small that a discrete ADC correlate would be difficult to discern. Correlation with history and comparison with prior imaging if available may be helpful in this regard.    Electronically Signed   By: Jeannine Boga M.D.   On: 09/09/2015 07:46   09/09/2015   CLINICAL DATA:  Initial evaluation for left-sided facial droop.  EXAM: MRI HEAD WITHOUT CONTRAST  MRA HEAD WITHOUT CONTRAST  TECHNIQUE: Multiplanar, multiecho pulse sequences of the brain and surrounding structures were obtained without intravenous contrast. Angiographic images of the head were obtained using MRA technique without contrast.  COMPARISON:  Prior CT from earlier the same day.  FINDINGS: MRI HEAD FINDINGS  There are several scattered small patchy foci of high signal intensity on DWI sequence involving the deep white matter of the bilateral centrum semi ovale, suspicious for possible small acute ischemic changes. There are is probable corresponding signal loss on ADC map seen about several of these foci, although some of these likely reflect T2 shine through, particularly within the left centrum semi ovale. Few scattered chronic lacunar infarcts present within this region. No associated hemorrhage or mass effect. No other infarct. Normal intravascular flow voids maintained.  Mild to moderate chronic small vessel ischemic changes present within the periventricular and deep white matter. No mass lesion, midline shift, or mass effect. No hydrocephalus. No extra-axial fluid collection. Scattered chronic hemosiderin staining seen about the sylvian fissures bilaterally as well as at the occipital horns of both lateral ventricles, likely related to episode of previous subarachnoid hemorrhage.  Craniocervical junction within normal limits. Mild degenerative changes noted within the partially visualized upper cervical spine. Incidental note made of a partially empty sella.  No acute abnormality about the orbits.  Paranasal sinuses are clear. No mastoid effusion. Inner ear structures grossly normal.  Bone marrow signal intensity within normal limits. Scalp soft tissues unremarkable.  MRA HEAD FINDINGS  ANTERIOR  CIRCULATION:  Visualized distal cervical segments of the internal carotid arteries are patent with antegrade flow. Petrous segments widely patent. Cavernous and supraclinoid segments well opacified. A1 segments widely patent. Patient appears to be status post  coil embolization for prior A-comm aneurysm. No residual aneurysm appreciated. Anterior cerebral arteries well opacified.  M1 segments patent without occlusion or high-grade stenosis. Mild atheromatous irregularity within the M1 segments bilaterally. MCA bifurcations normal. Distal MCA branches symmetric.  POSTERIOR CIRCULATION:  Vertebral arteries widely patent to the vertebrobasilar junction. Posterior inferior cerebral arteries well opacified. Basilar artery widely patent. Superior cerebellar arteries opacified proximally. Both the posterior cerebral arteries arise from the basilar artery and are well opacified to their distal aspects.  No new aneurysm or vascular malformation.  IMPRESSION: MRI HEAD IMPRESSION:  1. Patchy high signal intensity within the deep white matter of the bilateral centrum semi ovale, suspicious for possible small vessel ischemia. Please note that a portion of these findings main reflect T2 shine through/artifact from underlying chronic small vessel ischemic disease. Correlation with symptomatology and physical exam recommended. 2. Mild to moderate chronic small vessel ischemic disease.  MRA HEAD IMPRESSION:  1. No large or proximal arterial branch occlusion identified within the intracranial circulation. No high-grade or correctable stenosis identified. 2. 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 No residual aneurysm identified.  Electronically Signed: By: Jeannine Boga M.D. On: 09/09/2015 01:38   Ct Abdomen Pelvis W Contrast  09/08/2015   CLINICAL DATA:  Right lower quadrant pain  EXAM: CT ABDOMEN AND PELVIS WITH CONTRAST  TECHNIQUE: Multidetector CT imaging of the abdomen and pelvis was performed using the  standard protocol following bolus administration of intravenous contrast.  CONTRAST:  125mL OMNIPAQUE IOHEXOL 300 MG/ML  SOLN  COMPARISON:  None.  FINDINGS: Lower chest and abdominal wall: Large sliding hiatal hernia with retention of oral contrast.  Patchy ground-glass opacity and bandlike opacities in the right lower lobe most consistent with atelectasis.  Extensive coronary atherosclerotic calcification.  Hepatobiliary: No focal liver abnormality.No evidence of biliary obstruction or stone.  Pancreas: Borderline main duct enlargement at 3 mm. No visible mass or inflammation.  Spleen: Unremarkable.  Adrenals/Urinary Tract: Negative adrenals. Left nephrolithiasis, including a 20 mm nonobstructing, branching stone in the pelvis. Smooth urothelial left renal pelvis asymmetric thickening, presumably from irritation. Unremarkable bladder.  Reproductive:Hysterectomy and possible oophorectomies.  Stomach/Bowel: No obstruction. Appendix not identified. No indication of appendicitis.  Vascular/Lymphatic: Diffuse atherosclerotic calcification. No acute vascular abnormality. No mass or adenopathy.  Peritoneal: No ascites or pneumoperitoneum.  Musculoskeletal: No acute abnormalities. Multilevel facet arthropathy and disc degeneration.  IMPRESSION: 1. No explanation for right lower quadrant pain. 2. Urothelial thickening at the left renal pelvis, presumably related to branching 2 cm calculus. Urinalysis correlation recommended. 3. Right lower lobe atelectasis. 4. Large sliding hiatal hernia.   Electronically Signed   By: Monte Fantasia M.D.   On: 09/08/2015 17:35   Dg Knee Complete 4 Views Right  09/08/2015   CLINICAL DATA:  Confusion.  Shaft wing gait and slurred speech.  EXAM: RIGHT KNEE - COMPLETE 4+ VIEW  COMPARISON:  None  FINDINGS: No joint effusion. No fracture or subluxation. 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 There is no fracture or subluxation identified.  IMPRESSION: 1. No acute findings.  2. Osteoarthritis.   Electronically Signed   By: Kerby Moors M.D.   On: 09/08/2015 17:45   Mr Jodene Nam Head/brain Wo Cm  09/09/2015   ADDENDUM REPORT: 09/09/2015 07:46  ADDENDUM: In addition to the initially described findings, these scattered foci of high signal intensity seen on DWI sequence within the deep white matter of both cerebral hemispheres could potentially reflect subacute infarcts as well, as no definite ADC correlate  is seen, although many of these foci are so small that a discrete ADC correlate would be difficult to discern. Correlation with history and comparison with prior imaging if available may be helpful in this regard.   Electronically Signed   By: Jeannine Boga M.D.   On: 09/09/2015 07:46   09/09/2015   CLINICAL DATA:  Initial evaluation for left-sided facial droop.  EXAM: MRI HEAD WITHOUT CONTRAST  MRA HEAD WITHOUT CONTRAST  TECHNIQUE: Multiplanar, multiecho pulse sequences of the brain and surrounding structures were obtained without intravenous contrast. Angiographic images of the head were obtained using MRA technique without contrast.  COMPARISON:  Prior CT from earlier the same day.  FINDINGS: MRI HEAD FINDINGS  There are several scattered small patchy foci of high signal intensity on DWI sequence involving the deep white matter of the bilateral centrum semi ovale, suspicious for possible small acute ischemic changes. There are is probable corresponding signal loss on ADC map seen about several of these foci, although some of these likely reflect T2 shine through, particularly within the left centrum semi ovale. Few scattered chronic lacunar infarcts present within this region. No associated hemorrhage or mass effect. No other infarct. Normal intravascular flow voids maintained.  Mild to moderate chronic small vessel ischemic changes present within the periventricular and deep white matter. No mass lesion, midline shift, or mass effect. No hydrocephalus. No extra-axial fluid  collection. Scattered chronic hemosiderin staining seen about the sylvian fissures bilaterally as well as at the occipital horns of both lateral ventricles, likely related to episode of previous subarachnoid hemorrhage.  Craniocervical junction within normal limits. Mild degenerative changes noted within the partially visualized upper cervical spine. Incidental note made of a partially empty sella.  No acute abnormality about the orbits.  Paranasal sinuses are clear. No mastoid effusion. Inner ear structures grossly normal.  Bone marrow signal intensity within normal limits. Scalp soft tissues unremarkable.  MRA HEAD FINDINGS  ANTERIOR CIRCULATION:  Visualized distal cervical segments of the internal carotid arteries are patent with antegrade flow. Petrous segments widely patent. Cavernous and supraclinoid segments well opacified. A1 segments widely patent. 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 No residual aneurysm appreciated. Anterior cerebral arteries well opacified.  M1 segments patent without occlusion or high-grade stenosis. Mild atheromatous irregularity within the M1 segments bilaterally. MCA bifurcations normal. Distal MCA branches symmetric.  POSTERIOR CIRCULATION:  Vertebral arteries widely patent to the vertebrobasilar junction. Posterior inferior cerebral arteries well opacified. Basilar artery widely patent. Superior cerebellar arteries opacified proximally. Both the posterior cerebral arteries arise from the basilar artery and are well opacified to their distal aspects.  No new aneurysm or vascular malformation.  IMPRESSION: MRI HEAD IMPRESSION:  1. Patchy high signal intensity within the deep white matter of the bilateral centrum semi ovale, suspicious for possible small vessel ischemia. Please note that a portion of these findings main reflect T2 shine through/artifact from underlying chronic small vessel ischemic disease. Correlation with symptomatology and  physical exam recommended. 2. Mild to moderate chronic small vessel ischemic disease.  MRA HEAD IMPRESSION:  1. No large or proximal arterial branch occlusion identified within the intracranial circulation. No high-grade or correctable stenosis identified. 2. 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 No residual aneurysm identified.  Electronically Signed: By: Jeannine Boga M.D. On: 09/09/2015 01:38   Dg Hip Unilat With Pelvis 2-3 Views Right  09/08/2015   CLINICAL DATA:  Shuffling gait and recent fall with right hip pain, initial  encounter  EXAM: DG HIP (WITH OR WITHOUT PELVIS) 2-3V RIGHT  COMPARISON:  None.  FINDINGS: Contrast material is noted within the collecting systems and bladder related to the recent CT examination. The pelvic ring is intact. No acute fracture or dislocation is noted. Mild degenerative changes of the right hip joint are seen. No soft tissue abnormality is noted.  IMPRESSION: No acute abnormality seen.   Electronically Signed   By: Inez Catalina M.D.   On: 09/08/2015 17:43    Oren Binet, MD  [facility] Pager:[phone]  If 7PM-7AM, please contact night-coverage [email or web address] Password [email or web address] 09/10/2015, 12:31 PM   LOS: 2 days

## 2015-09-10 NOTE — Progress Notes (Signed)
Subjective: patient feels her confusion and her gait has improved.   Objective: Current vital signs: BP 141/76 mmHg  Pulse 90  Temp(Src) 98.9 F (37.2 C) (Oral)  Resp 18  Ht 5\' 3"  (1.6 m)  Wt 67.2 kg (148 lb 2.4 oz)  BMI 26.25 kg/m2  SpO2 94% Vital signs in last 24 hours: Temp:  [98.1 F (36.7 C)-98.9 F (37.2 C)] 98.9 F (37.2 C) (09/13 0631) Pulse Rate:  [80-90] 90 (09/13 0631) Resp:  [16-18] 18 (09/13 0631) BP: (114-141)/(52-76) 141/76 mmHg (09/13 0631) SpO2:  [94 %-99 %] 94 % (09/13 0631)  Intake/Output from previous day: 09/12 0701 - 09/13 0700 In: 240 [P.O.:240] Out: 1 [Stool:1] Intake/Output this shift: Total I/O In: 240 [P.O.:240] Out: 1 [Stool:1] Nutritional status: DIET DYS 3 Room service appropriate?: Yes; Fluid consistency:: Thin  Neurologic Exam:  Mental Status: Alert, oriented, thought content appropriate.  Speech fluent without evidence of aphasia.  Able to follow 3 step commands without difficulty. Cranial Nerves: II: Discs flat bilaterally; Visual fields grossly normal, pupils equal, round, reactive to light and accommodation III,IV, VI: ptosis not present, extra-ocular motions intact bilaterally V,VII: smile symmetric, facial light touch sensation normal bilaterally VIII: hearing normal bilaterally IX,X: uvula rises symmetrically XI: bilateral shoulder shrug XII: midline tongue extension without atrophy or fasciculations  Motor: Right : Upper extremity   5/5    Left:     Upper extremity   5/5  Lower extremity   5/5     Lower extremity   5/5 Tone and bulk:normal tone throughout; no atrophy noted Sensory: Pinprick and light touch intact throughout, bilaterally Deep Tendon Reflexes:  Right: Upper Extremity   Left: Upper extremity   biceps (C-5 to C-6) 2/4   biceps (C-5 to C-6) 2/4 tricep (C7) 2/4    triceps (C7) 2/4 Brachioradialis (C6) 2/4  Brachioradialis (C6) 2/4  Lower Extremity Lower Extremity  quadriceps (L-2 to L-4) 2/4   quadriceps  (L-2 to L-4) 2/4 Achilles (S1) 2/4   Achilles (S1) 2/4  Plantars: Right: up going   Left: downgoing Cerebellar: normal finger-to-nose,  normal heel-to-shin test    Lab Results: Basic Metabolic Panel:  Recent Labs Lab 09/08/15 1558 09/08/15 1607  NA 143 140  K 4.1 4.0  CL 108 108  CO2  --  21*  GLUCOSE 107* 106*  BUN 19 16  CREATININE 1.00 1.01*  CALCIUM  --  8.9    Liver Function Tests:  Recent Labs Lab 09/08/15 1607  AST 21  ALT 21  ALKPHOS 86  BILITOT 0.6  PROT 6.9  ALBUMIN 3.7   No results for input(s): LIPASE, AMYLASE in the last 168 hours. No results for input(s): AMMONIA in the last 168 hours.  CBC:  Recent Labs Lab 09/08/15 1558 09/08/15 1607 09/10/15 0624  WBC  --  20.9* 14.7*  NEUTROABS  --  17.9*  --   HGB 16.0* 15.1* 12.8  HCT 47.0* 45.1 40.4  MCV  --  88.6 89.6  PLT  --  222 203    Cardiac Enzymes: No results for input(s): CKTOTAL, CKMB, CKMBINDEX, TROPONINI in the last 168 hours.  Lipid Panel:  Recent Labs Lab 09/09/15 0532  CHOL 159  TRIG 90  HDL 52  CHOLHDL 3.1  VLDL 18  LDLCALC 89    CBG:  Recent Labs Lab 09/08/15 1555  GLUCAP 115*    Microbiology: Results for orders placed or performed during the hospital encounter of 09/08/15  Blood culture (routine x 2)  Status: None (Preliminary result)   Collection Time: 09/08/15  6:00 PM  Result Value Ref Range Status   Specimen Description BLOOD LEFT HAND  Final   Special Requests BOTTLES DRAWN AEROBIC AND ANAEROBIC 4CC  Final   Culture NO GROWTH < 24 HOURS  Final   Report Status PENDING  Incomplete  Urine culture     Status: None (Preliminary result)   Collection Time: 09/08/15  6:17 PM  Result Value Ref Range Status   Specimen Description URINE, CLEAN CATCH  Final   Special Requests NONE  Final   Culture CULTURE REINCUBATED FOR BETTER GROWTH  Final   Report Status PENDING  Incomplete    Coagulation Studies:  Recent Labs  09/08/15 1607  LABPROT 13.4   INR 1.00    Imaging: Dg Chest 2 View  09/08/2015   CLINICAL DATA:  Confusion.  EXAM: CHEST  2 VIEW  COMPARISON:  September 24, 2014.  FINDINGS: The heart size and mediastinal contours are within normal limits. No pneumothorax is noted. Left lung is clear. No significant pleural effusion is noted. Mild right basilar opacity is noted concerning for subsegmental atelectasis. The visualized skeletal structures are unremarkable.  IMPRESSION: Probable mild right basilar subsegmental atelectasis.   Electronically Signed   By: Marijo Conception, M.D.   On: 09/08/2015 17:43   Ct Head Wo Contrast  09/08/2015   CLINICAL DATA:  Difficulty walking, recent ischemic stroke 9 days ago  EXAM: CT HEAD WITHOUT CONTRAST  TECHNIQUE: Contiguous axial images were obtained from the base of the skull through the vertex without intravenous contrast.  COMPARISON:  10/07/2014  FINDINGS: The bony calvarium is intact. There are changes consistent with prior coiling which are stable from the prior exam. Scattered areas of decreased attenuation are noted consistent with prior white matter ischemic change. No findings to suggest acute hemorrhage, acute infarction or space-occupying mass lesion are noted.  IMPRESSION: Chronic ischemic changes and changes of prior aneurysm coiling. No acute abnormality noted.   Electronically Signed   By: Inez Catalina M.D.   On: 09/08/2015 17:23   Mr Brain Wo Contrast  09/09/2015   ADDENDUM REPORT: 09/09/2015 07:46  ADDENDUM: In addition to the initially described findings, these scattered foci of high signal intensity seen on DWI sequence within the deep white matter of both cerebral hemispheres could potentially reflect subacute infarcts as well, as no definite ADC correlate is seen, although many of these foci are so small that a discrete ADC correlate would be difficult to discern. Correlation with history and comparison with prior imaging if available may be helpful in this regard.   Electronically  Signed   By: Jeannine Boga M.D.   On: 09/09/2015 07:46   09/09/2015   CLINICAL DATA:  Initial evaluation for left-sided facial droop.  EXAM: MRI HEAD WITHOUT CONTRAST  MRA HEAD WITHOUT CONTRAST  TECHNIQUE: Multiplanar, multiecho pulse sequences of the brain and surrounding structures were obtained without intravenous contrast. Angiographic images of the head were obtained using MRA technique without contrast.  COMPARISON:  Prior CT from earlier the same day.  FINDINGS: MRI HEAD FINDINGS  There are several scattered small patchy foci of high signal intensity on DWI sequence involving the deep white matter of the bilateral centrum semi ovale, suspicious for possible small acute ischemic changes. There are is probable corresponding signal loss on ADC map seen about several of these foci, although some of these likely reflect T2 shine through, particularly within the left centrum semi ovale. Few  scattered chronic lacunar infarcts present within this region. No associated hemorrhage or mass effect. No other infarct. Normal intravascular flow voids maintained.  Mild to moderate chronic small vessel ischemic changes present within the periventricular and deep white matter. No mass lesion, midline shift, or mass effect. No hydrocephalus. No extra-axial fluid collection. Scattered chronic hemosiderin staining seen about the sylvian fissures bilaterally as well as at the occipital horns of both lateral ventricles, likely related to episode of previous subarachnoid hemorrhage.  Craniocervical junction within normal limits. Mild degenerative changes noted within the partially visualized upper cervical spine. Incidental note made of a partially empty sella.  No acute abnormality about the orbits.  Paranasal sinuses are clear. No mastoid effusion. Inner ear structures grossly normal.  Bone marrow signal intensity within normal limits. Scalp soft tissues unremarkable.  MRA HEAD FINDINGS  ANTERIOR CIRCULATION:  Visualized  distal cervical segments of the internal carotid arteries are patent with antegrade flow. Petrous segments widely patent. Cavernous and supraclinoid segments well opacified. A1 segments widely patent. 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 No residual aneurysm appreciated. Anterior cerebral arteries well opacified.  M1 segments patent without occlusion or high-grade stenosis. Mild atheromatous irregularity within the M1 segments bilaterally. MCA bifurcations normal. Distal MCA branches symmetric.  POSTERIOR CIRCULATION:  Vertebral arteries widely patent to the vertebrobasilar junction. Posterior inferior cerebral arteries well opacified. Basilar artery widely patent. Superior cerebellar arteries opacified proximally. Both the posterior cerebral arteries arise from the basilar artery and are well opacified to their distal aspects.  No new aneurysm or vascular malformation.  IMPRESSION: MRI HEAD IMPRESSION:  1. Patchy high signal intensity within the deep white matter of the bilateral centrum semi ovale, suspicious for possible small vessel ischemia. Please note that a portion of these findings main reflect T2 shine through/artifact from underlying chronic small vessel ischemic disease. Correlation with symptomatology and physical exam recommended. 2. Mild to moderate chronic small vessel ischemic disease.  MRA HEAD IMPRESSION:  1. No large or proximal arterial branch occlusion identified within the intracranial circulation. No high-grade or correctable stenosis identified. 2. 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 No residual aneurysm identified.  Electronically Signed: By: Jeannine Boga M.D. On: 09/09/2015 01:38   Ct Abdomen Pelvis W Contrast  09/08/2015   CLINICAL DATA:  Right lower quadrant pain  EXAM: CT ABDOMEN AND PELVIS WITH CONTRAST  TECHNIQUE: Multidetector CT imaging of the abdomen and pelvis was performed using the standard protocol  following bolus administration of intravenous contrast.  CONTRAST:  165mL OMNIPAQUE IOHEXOL 300 MG/ML  SOLN  COMPARISON:  None.  FINDINGS: Lower chest and abdominal wall: Large sliding hiatal hernia with retention of oral contrast.  Patchy ground-glass opacity and bandlike opacities in the right lower lobe most consistent with atelectasis.  Extensive coronary atherosclerotic calcification.  Hepatobiliary: No focal liver abnormality.No evidence of biliary obstruction or stone.  Pancreas: Borderline main duct enlargement at 3 mm. No visible mass or inflammation.  Spleen: Unremarkable.  Adrenals/Urinary Tract: Negative adrenals. Left nephrolithiasis, including a 20 mm nonobstructing, branching stone in the pelvis. Smooth urothelial left renal pelvis asymmetric thickening, presumably from irritation. Unremarkable bladder.  Reproductive:Hysterectomy and possible oophorectomies.  Stomach/Bowel: No obstruction. Appendix not identified. No indication of appendicitis.  Vascular/Lymphatic: Diffuse atherosclerotic calcification. No acute vascular abnormality. No mass or adenopathy.  Peritoneal: No ascites or pneumoperitoneum.  Musculoskeletal: No acute abnormalities. Multilevel facet arthropathy and disc degeneration.  IMPRESSION: 1. No explanation for right lower quadrant pain. 2.  Urothelial thickening at the left renal pelvis, presumably related to branching 2 cm calculus. Urinalysis correlation recommended. 3. Right lower lobe atelectasis. 4. Large sliding hiatal hernia.   Electronically Signed   By: Monte Fantasia M.D.   On: 09/08/2015 17:35   Dg Knee Complete 4 Views Right  09/08/2015   CLINICAL DATA:  Confusion.  Shaft wing gait and slurred speech.  EXAM: RIGHT KNEE - COMPLETE 4+ VIEW  COMPARISON:  None  FINDINGS: No joint effusion. No fracture or subluxation. 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 There is no fracture or subluxation identified.  IMPRESSION: 1. No acute findings. 2. Osteoarthritis.    Electronically Signed   By: Kerby Moors M.D.   On: 09/08/2015 17:45   Mr Jodene Nam Head/brain Wo Cm  09/09/2015   ADDENDUM REPORT: 09/09/2015 07:46  ADDENDUM: In addition to the initially described findings, these scattered foci of high signal intensity seen on DWI sequence within the deep white matter of both cerebral hemispheres could potentially reflect subacute infarcts as well, as no definite ADC correlate is seen, although many of these foci are so small that a discrete ADC correlate would be difficult to discern. Correlation with history and comparison with prior imaging if available may be helpful in this regard.   Electronically Signed   By: Jeannine Boga M.D.   On: 09/09/2015 07:46   09/09/2015   CLINICAL DATA:  Initial evaluation for left-sided facial droop.  EXAM: MRI HEAD WITHOUT CONTRAST  MRA HEAD WITHOUT CONTRAST  TECHNIQUE: Multiplanar, multiecho pulse sequences of the brain and surrounding structures were obtained without intravenous contrast. Angiographic images of the head were obtained using MRA technique without contrast.  COMPARISON:  Prior CT from earlier the same day.  FINDINGS: MRI HEAD FINDINGS  There are several scattered small patchy foci of high signal intensity on DWI sequence involving the deep white matter of the bilateral centrum semi ovale, suspicious for possible small acute ischemic changes. There are is probable corresponding signal loss on ADC map seen about several of these foci, although some of these likely reflect T2 shine through, particularly within the left centrum semi ovale. Few scattered chronic lacunar infarcts present within this region. No associated hemorrhage or mass effect. No other infarct. Normal intravascular flow voids maintained.  Mild to moderate chronic small vessel ischemic changes present within the periventricular and deep white matter. No mass lesion, midline shift, or mass effect. No hydrocephalus. No extra-axial fluid collection. Scattered  chronic hemosiderin staining seen about the sylvian fissures bilaterally as well as at the occipital horns of both lateral ventricles, likely related to episode of previous subarachnoid hemorrhage.  Craniocervical junction within normal limits. Mild degenerative changes noted within the partially visualized upper cervical spine. Incidental note made of a partially empty sella.  No acute abnormality about the orbits.  Paranasal sinuses are clear. No mastoid effusion. Inner ear structures grossly normal.  Bone marrow signal intensity within normal limits. Scalp soft tissues unremarkable.  MRA HEAD FINDINGS  ANTERIOR CIRCULATION:  Visualized distal cervical segments of the internal carotid arteries are patent with antegrade flow. Petrous segments widely patent. Cavernous and supraclinoid segments well opacified. A1 segments widely patent. 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 No residual aneurysm appreciated. Anterior cerebral arteries well opacified.  M1 segments patent without occlusion or high-grade stenosis. Mild atheromatous irregularity within the M1 segments bilaterally. MCA bifurcations normal. Distal MCA branches symmetric.  POSTERIOR CIRCULATION:  Vertebral arteries widely patent to the  vertebrobasilar junction. Posterior inferior cerebral arteries well opacified. Basilar artery widely patent. Superior cerebellar arteries opacified proximally. Both the posterior cerebral arteries arise from the basilar artery and are well opacified to their distal aspects.  No new aneurysm or vascular malformation.  IMPRESSION: MRI HEAD IMPRESSION:  1. Patchy high signal intensity within the deep white matter of the bilateral centrum semi ovale, suspicious for possible small vessel ischemia. Please note that a portion of these findings main reflect T2 shine through/artifact from underlying chronic small vessel ischemic disease. Correlation with symptomatology and physical exam  recommended. 2. Mild to moderate chronic small vessel ischemic disease.  MRA HEAD IMPRESSION:  1. No large or proximal arterial branch occlusion identified within the intracranial circulation. No high-grade or correctable stenosis identified. 2. 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 No residual aneurysm identified.  Electronically Signed: By: Jeannine Boga M.D. On: 09/09/2015 01:38   Dg Hip Unilat With Pelvis 2-3 Views Right  09/08/2015   CLINICAL DATA:  Shuffling gait and recent fall with right hip pain, initial encounter  EXAM: DG HIP (WITH OR WITHOUT PELVIS) 2-3V RIGHT  COMPARISON:  None.  FINDINGS: Contrast material is noted within the collecting systems and bladder related to the recent CT examination. The pelvic ring is intact. No acute fracture or dislocation is noted. Mild degenerative changes of the right hip joint are seen. No soft tissue abnormality is noted.  IMPRESSION: No acute abnormality seen.   Electronically Signed   By: Inez Catalina M.D.   On: 09/08/2015 17:43    Medications:  Scheduled: . aspirin  300 mg Rectal Daily   Or  . aspirin  325 mg Oral Daily  . atorvastatin  20 mg Oral q1800  . cefTRIAXone (ROCEPHIN)  IV  1 g Intravenous Q24H  . clonazePAM  0.5 mg Oral BID  . feeding supplement (ENSURE ENLIVE)  237 mL Oral Q24H  . Influenza vac split quadrivalent PF  0.5 mL Intramuscular Tomorrow-1000  . levETIRAcetam  500 mg Oral TID  . mirtazapine  15 mg Oral QHS  . pantoprazole  40 mg Oral QHS  . predniSONE  10 mg Oral Q breakfast  . topiramate  50 mg Oral QHS  . traZODone  50 mg Oral QHS  . venlafaxine XR  225 mg Oral Q breakfast    Assessment/Plan: 64 YO female with increased confusion and gait difficulty.  After receiving ABX she states her confusion has cleared and gait is much more stable. Likely infection unmasking previous symptoms of old stroke.   Recommend: 1) continue ASA 2) Continue to treat infection 3) Telemetry   Etta Quill  PA-C [facility] [phone]  09/10/2015, 9:27 AM  I personally participated in this patient's evaluation and management, including formulating the above clinical impression and management recommendations.  Rush Farmer M.D. [facility] [phone]

## 2015-09-11 ENCOUNTER — Ambulatory Visit (HOSPITAL_COMMUNITY): Payer: Medicare Other

## 2015-09-11 DIAGNOSIS — G9340 Encephalopathy, unspecified: Secondary | ICD-10-CM

## 2015-09-11 DIAGNOSIS — E785 Hyperlipidemia, unspecified: Secondary | ICD-10-CM

## 2015-09-11 DIAGNOSIS — G459 Transient cerebral ischemic attack, unspecified: Secondary | ICD-10-CM

## 2015-09-11 DIAGNOSIS — G43919 Migraine, unspecified, intractable, without status migrainosus: Secondary | ICD-10-CM

## 2015-09-11 MED ORDER — CLONAZEPAM 0.5 MG PO TABS: 0.5000 mg | ORAL_TABLET | Freq: Two times a day (BID) | ORAL | Status: DC

## 2015-09-11 MED ORDER — CEPHALEXIN 500 MG PO CAPS: 500.0000 mg | ORAL_CAPSULE | Freq: Three times a day (TID) | ORAL | Status: DC

## 2015-09-11 MED ORDER — ENSURE ENLIVE PO LIQD: 237.0000 mL | ORAL | Status: AC

## 2015-09-11 MED ORDER — PANTOPRAZOLE SODIUM 40 MG PO TBEC: 40.0000 mg | DELAYED_RELEASE_TABLET | Freq: Every day | ORAL | Status: AC

## 2015-09-11 NOTE — Progress Notes (Signed)
Pt ready for discharged per MD. Report were called and given to Fleet Contras at [facility]. All patient belongings gathered. All questions answered. Pt ready to be transported by [facility].   Teryl Lucy  09/11/2015 3:25 PM

## 2015-09-11 NOTE — Clinical Social Work Note (Signed)
Clinical Social Worker facilitated patient discharge including contacting patient family and facility to confirm patient discharge plans.  Clinical information faxed to facility and family agreeable with plan.  CSW arranged ambulance transport via [facility] to [facility].  RN to call report prior to discharge [phone].  DC packet prepared and on chart for transport with number for report.   Clinical Social Worker will sign off for now as social work intervention is no longer needed. Please consult [place] again if new need arises.  Glendon Axe, MSW, LCSWA [phone] 09/11/2015 3:05 PM

## 2015-09-11 NOTE — Care Management Important Message (Signed)
Important Message  Patient Details  Name: Mindy Mills MRN: [id number] Date of Birth: 03-17-1951   Medicare Important Message Given:  Yes-second notification given    Delorse Lek 09/11/2015, 2:09 PM

## 2015-09-11 NOTE — Clinical Social Work Note (Signed)
Clinical Social Worker notified that [facility] no longer has short-term bed to offer. CSW informed family. Family now choosing [facility] and [facility]. CSW has notified facility and awaiting response.   CSW remains available as needed.   Glendon Axe, MSW, LCSWA [phone] 09/11/2015 10:41 AM

## 2015-09-11 NOTE — Progress Notes (Signed)
*  PRELIMINARY RESULTS* Vascular Ultrasound Carotid Duplex (Doppler) has been completed. Findings suggest 1-39% internal carotid artery stenosis bilaterally. Vertebral arteries are patent with antegrade flow.  09/11/2015 1:24 PM Maudry Mayhew, RVT, RDCS, RDMS

## 2015-09-11 NOTE — H&P (Signed)
PATIENT DETAILS Name: Mindy Mills Age: 64 y.o. Sex: female Date of Birth: April 25, 1951 MRN: [id number]. Admitting Physician: Toy Baker, MD PCP:No primary care provider on file.  Admit Date: 09/08/2015 Discharge date: 09/11/2015  Recommendations for Outpatient Follow-up:  1.  Please check CBC and chemistries in 1 week.   PRIMARY DISCHARGE DIAGNOSIS:  Active Problems:   Fall   UTI (lower urinary tract infection)   Dehydration   Sepsis   Systolic heart failure   Confusion   TIA (transient ischemic attack)      PAST MEDICAL HISTORY: Past Medical History  Diagnosis Date  . Hypertension   . Migraine   . Cancer     Cervicle  . Hepatitis C   . Stroke     DISCHARGE MEDICATIONS: Current Discharge Medication List    START taking these medications   Details  cephALEXin (KEFLEX) 500 MG capsule Take 1 capsule (500 mg total) by mouth 3 (three) times daily. For 3 more days from 9/14 and then stop    pantoprazole (PROTONIX) 40 MG tablet Take 1 tablet (40 mg total) by mouth at bedtime.      CONTINUE these medications which have CHANGED   Details  clonazePAM (KLONOPIN) 0.5 MG tablet Take 1 tablet (0.5 mg total) by mouth 2 (two) times daily. Qty: 20 tablet, Refills: 0    feeding supplement, ENSURE ENLIVE, (ENSURE ENLIVE) LIQD Take 237 mLs by mouth daily.      CONTINUE these medications which have NOT CHANGED   Details  aspirin 325 MG EC tablet Take 325 mg by mouth daily.    atorvastatin (LIPITOR) 20 MG tablet Take 20 mg by mouth daily at 6 PM.    EPINEPHrine (EPIPEN) 0.3 mg/0.3 mL IJ SOAJ injection Inject 0.3 mg into the muscle once as needed (for allergic reaction).    levETIRAcetam (KEPPRA) 500 MG tablet Take 500 mg by mouth 3 (three) times daily.    mirtazapine (REMERON) 15 MG tablet Take 15 mg by mouth at bedtime.    predniSONE (DELTASONE) 5 MG tablet Take 5 mg by mouth daily with breakfast.    topiramate (TOPAMAX) 25 MG tablet Take 50 mg by mouth at  bedtime.    traZODone (DESYREL) 50 MG tablet Take 50 mg by mouth at bedtime.     venlafaxine XR (EFFEXOR-XR) 75 MG 24 hr capsule Take 225 mg by mouth daily with breakfast.      STOP taking these medications     meloxicam (MOBIC) 15 MG tablet      pantoprazole (PROTONIX) 40 MG injection      promethazine (PHENERGAN) 25 MG tablet      albuterol (PROVENTIL) (2.5 MG/3ML) 0.083% nebulizer solution      bisacodyl (DULCOLAX) 10 MG suppository      budesonide (PULMICORT) 0.5 MG/2ML nebulizer solution      ipratropium-albuterol (DUONEB) 0.5-2.5 (3) MG/3ML SOLN      levETIRAcetam (KEPRRA) 500 MG/100ML SOLN      NiMODipine (NYMALIZE) 60 MG/20ML SOLN      Water For Irrigation, Sterile (FREE WATER) SOLN         ALLERGIES:   Allergies  Allergen Reactions  . Bee Venom Anaphylaxis and Hives    BRIEF HPI:  See H&P, Labs, Consult and Test reports for all details in brief, patient was admitted for evaluation of encephalopathy.  CONSULTATIONS:   neurology  PERTINENT RADIOLOGIC STUDIES: Dg Chest 2 View  09/08/2015   CLINICAL DATA:  Confusion.  EXAM: CHEST  2 VIEW  COMPARISON:  September 24, 2014.  FINDINGS: The heart size and mediastinal contours are within normal limits. No pneumothorax is noted. Left lung is clear. No significant pleural effusion is noted. Mild right basilar opacity is noted concerning for subsegmental atelectasis. The visualized skeletal structures are unremarkable.  IMPRESSION: Probable mild right basilar subsegmental atelectasis.   Electronically Signed   By: Marijo Conception, M.D.   On: 09/08/2015 17:43   Ct Head Wo Contrast  09/08/2015   CLINICAL DATA:  Difficulty walking, recent ischemic stroke 9 days ago  EXAM: CT HEAD WITHOUT CONTRAST  TECHNIQUE: Contiguous axial images were obtained from the base of the skull through the vertex without intravenous contrast.  COMPARISON:  10/07/2014  FINDINGS: The bony calvarium is intact. There are changes consistent with prior  coiling which are stable from the prior exam. Scattered areas of decreased attenuation are noted consistent with prior white matter ischemic change. No findings to suggest acute hemorrhage, acute infarction or space-occupying mass lesion are noted.  IMPRESSION: Chronic ischemic changes and changes of prior aneurysm coiling. No acute abnormality noted.   Electronically Signed   By: Inez Catalina M.D.   On: 09/08/2015 17:23   Mr Brain Wo Contrast  09/09/2015   ADDENDUM REPORT: 09/09/2015 07:46  ADDENDUM: In addition to the initially described findings, these scattered foci of high signal intensity seen on DWI sequence within the deep white matter of both cerebral hemispheres could potentially reflect subacute infarcts as well, as no definite ADC correlate is seen, although many of these foci are so small that a discrete ADC correlate would be difficult to discern. Correlation with history and comparison with prior imaging if available may be helpful in this regard.   Electronically Signed   By: Jeannine Boga M.D.   On: 09/09/2015 07:46   09/09/2015   CLINICAL DATA:  Initial evaluation for left-sided facial droop.  EXAM: MRI HEAD WITHOUT CONTRAST  MRA HEAD WITHOUT CONTRAST  TECHNIQUE: Multiplanar, multiecho pulse sequences of the brain and surrounding structures were obtained without intravenous contrast. Angiographic images of the head were obtained using MRA technique without contrast.  COMPARISON:  Prior CT from earlier the same day.  FINDINGS: MRI HEAD FINDINGS  There are several scattered small patchy foci of high signal intensity on DWI sequence involving the deep white matter of the bilateral centrum semi ovale, suspicious for possible small acute ischemic changes. There are is probable corresponding signal loss on ADC map seen about several of these foci, although some of these likely reflect T2 shine through, particularly within the left centrum semi ovale. Few scattered chronic lacunar infarcts  present within this region. No associated hemorrhage or mass effect. No other infarct. Normal intravascular flow voids maintained.  Mild to moderate chronic small vessel ischemic changes present within the periventricular and deep white matter. No mass lesion, midline shift, or mass effect. No hydrocephalus. No extra-axial fluid collection. Scattered chronic hemosiderin staining seen about the sylvian fissures bilaterally as well as at the occipital horns of both lateral ventricles, likely related to episode of previous subarachnoid hemorrhage.  Craniocervical junction within normal limits. Mild degenerative changes noted within the partially visualized upper cervical spine. Incidental note made of a partially empty sella.  No acute abnormality about the orbits.  Paranasal sinuses are clear. No mastoid effusion. Inner ear structures grossly normal.  Bone marrow signal intensity within normal limits. Scalp soft tissues unremarkable.  MRA HEAD FINDINGS  ANTERIOR CIRCULATION:  Visualized distal cervical segments of the internal  carotid arteries are patent with antegrade flow. Petrous segments widely patent. Cavernous and supraclinoid segments well opacified. A1 segments widely patent. 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 No residual aneurysm appreciated. Anterior cerebral arteries well opacified.  M1 segments patent without occlusion or high-grade stenosis. Mild atheromatous irregularity within the M1 segments bilaterally. MCA bifurcations normal. Distal MCA branches symmetric.  POSTERIOR CIRCULATION:  Vertebral arteries widely patent to the vertebrobasilar junction. Posterior inferior cerebral arteries well opacified. Basilar artery widely patent. Superior cerebellar arteries opacified proximally. Both the posterior cerebral arteries arise from the basilar artery and are well opacified to their distal aspects.  No new aneurysm or vascular malformation.  IMPRESSION: MRI HEAD  IMPRESSION:  1. Patchy high signal intensity within the deep white matter of the bilateral centrum semi ovale, suspicious for possible small vessel ischemia. Please note that a portion of these findings main reflect T2 shine through/artifact from underlying chronic small vessel ischemic disease. Correlation with symptomatology and physical exam recommended. 2. Mild to moderate chronic small vessel ischemic disease.  MRA HEAD IMPRESSION:  1. No large or proximal arterial branch occlusion identified within the intracranial circulation. No high-grade or correctable stenosis identified. 2. 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 No residual aneurysm identified.  Electronically Signed: By: Jeannine Boga M.D. On: 09/09/2015 01:38   Ct Abdomen Pelvis W Contrast  09/08/2015   CLINICAL DATA:  Right lower quadrant pain  EXAM: CT ABDOMEN AND PELVIS WITH CONTRAST  TECHNIQUE: Multidetector CT imaging of the abdomen and pelvis was performed using the standard protocol following bolus administration of intravenous contrast.  CONTRAST:  151mL OMNIPAQUE IOHEXOL 300 MG/ML  SOLN  COMPARISON:  None.  FINDINGS: Lower chest and abdominal wall: Large sliding hiatal hernia with retention of oral contrast.  Patchy ground-glass opacity and bandlike opacities in the right lower lobe most consistent with atelectasis.  Extensive coronary atherosclerotic calcification.  Hepatobiliary: No focal liver abnormality.No evidence of biliary obstruction or stone.  Pancreas: Borderline main duct enlargement at 3 mm. No visible mass or inflammation.  Spleen: Unremarkable.  Adrenals/Urinary Tract: Negative adrenals. Left nephrolithiasis, including a 20 mm nonobstructing, branching stone in the pelvis. Smooth urothelial left renal pelvis asymmetric thickening, presumably from irritation. Unremarkable bladder.  Reproductive:Hysterectomy and possible oophorectomies.  Stomach/Bowel: No obstruction. Appendix not identified. No  indication of appendicitis.  Vascular/Lymphatic: Diffuse atherosclerotic calcification. No acute vascular abnormality. No mass or adenopathy.  Peritoneal: No ascites or pneumoperitoneum.  Musculoskeletal: No acute abnormalities. Multilevel facet arthropathy and disc degeneration.  IMPRESSION: 1. No explanation for right lower quadrant pain. 2. Urothelial thickening at the left renal pelvis, presumably related to branching 2 cm calculus. Urinalysis correlation recommended. 3. Right lower lobe atelectasis. 4. Large sliding hiatal hernia.   Electronically Signed   By: Monte Fantasia M.D.   On: 09/08/2015 17:35   Dg Knee Complete 4 Views Right  09/08/2015   CLINICAL DATA:  Confusion.  Shaft wing gait and slurred speech.  EXAM: RIGHT KNEE - COMPLETE 4+ VIEW  COMPARISON:  None  FINDINGS: No joint effusion. No fracture or subluxation. 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 There is no fracture or subluxation identified.  IMPRESSION: 1. No acute findings. 2. Osteoarthritis.   Electronically Signed   By: Kerby Moors M.D.   On: 09/08/2015 17:45   Mr Jodene Nam Head/brain Wo Cm  09/09/2015   ADDENDUM REPORT: 09/09/2015 07:46  ADDENDUM: In addition to the initially described findings, these scattered foci of high  signal intensity seen on DWI sequence within the deep white matter of both cerebral hemispheres could potentially reflect subacute infarcts as well, as no definite ADC correlate is seen, although many of these foci are so small that a discrete ADC correlate would be difficult to discern. Correlation with history and comparison with prior imaging if available may be helpful in this regard.   Electronically Signed   By: Jeannine Boga M.D.   On: 09/09/2015 07:46   09/09/2015   CLINICAL DATA:  Initial evaluation for left-sided facial droop.  EXAM: MRI HEAD WITHOUT CONTRAST  MRA HEAD WITHOUT CONTRAST  TECHNIQUE: Multiplanar, multiecho pulse sequences of the brain and surrounding structures were  obtained without intravenous contrast. Angiographic images of the head were obtained using MRA technique without contrast.  COMPARISON:  Prior CT from earlier the same day.  FINDINGS: MRI HEAD FINDINGS  There are several scattered small patchy foci of high signal intensity on DWI sequence involving the deep white matter of the bilateral centrum semi ovale, suspicious for possible small acute ischemic changes. There are is probable corresponding signal loss on ADC map seen about several of these foci, although some of these likely reflect T2 shine through, particularly within the left centrum semi ovale. Few scattered chronic lacunar infarcts present within this region. No associated hemorrhage or mass effect. No other infarct. Normal intravascular flow voids maintained.  Mild to moderate chronic small vessel ischemic changes present within the periventricular and deep white matter. No mass lesion, midline shift, or mass effect. No hydrocephalus. No extra-axial fluid collection. Scattered chronic hemosiderin staining seen about the sylvian fissures bilaterally as well as at the occipital horns of both lateral ventricles, likely related to episode of previous subarachnoid hemorrhage.  Craniocervical junction within normal limits. Mild degenerative changes noted within the partially visualized upper cervical spine. Incidental note made of a partially empty sella.  No acute abnormality about the orbits.  Paranasal sinuses are clear. No mastoid effusion. Inner ear structures grossly normal.  Bone marrow signal intensity within normal limits. Scalp soft tissues unremarkable.  MRA HEAD FINDINGS  ANTERIOR CIRCULATION:  Visualized distal cervical segments of the internal carotid arteries are patent with antegrade flow. Petrous segments widely patent. Cavernous and supraclinoid segments well opacified. A1 segments widely patent. 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 No residual  aneurysm appreciated. Anterior cerebral arteries well opacified.  M1 segments patent without occlusion or high-grade stenosis. Mild atheromatous irregularity within the M1 segments bilaterally. MCA bifurcations normal. Distal MCA branches symmetric.  POSTERIOR CIRCULATION:  Vertebral arteries widely patent to the vertebrobasilar junction. Posterior inferior cerebral arteries well opacified. Basilar artery widely patent. Superior cerebellar arteries opacified proximally. Both the posterior cerebral arteries arise from the basilar artery and are well opacified to their distal aspects.  No new aneurysm or vascular malformation.  IMPRESSION: MRI HEAD IMPRESSION:  1. Patchy high signal intensity within the deep white matter of the bilateral centrum semi ovale, suspicious for possible small vessel ischemia. Please note that a portion of these findings main reflect T2 shine through/artifact from underlying chronic small vessel ischemic disease. Correlation with symptomatology and physical exam recommended. 2. Mild to moderate chronic small vessel ischemic disease.  MRA HEAD IMPRESSION:  1. No large or proximal arterial branch occlusion identified within the intracranial circulation. No high-grade or correctable stenosis identified. 2. 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 No residual aneurysm identified.  Electronically Signed: By: Jeannine Boga M.D. On: 09/09/2015 01:38  Dg Hip Unilat With Pelvis 2-3 Views Right  09/08/2015   CLINICAL DATA:  Shuffling gait and recent fall with right hip pain, initial encounter  EXAM: DG HIP (WITH OR WITHOUT PELVIS) 2-3V RIGHT  COMPARISON:  None.  FINDINGS: Contrast material is noted within the collecting systems and bladder related to the recent CT examination. The pelvic ring is intact. No acute fracture or dislocation is noted. Mild degenerative changes of the right hip joint are seen. No soft tissue abnormality is noted.  IMPRESSION: No acute abnormality  seen.   Electronically Signed   By: Inez Catalina M.D.   On: 09/08/2015 17:43     PERTINENT LAB RESULTS: CBC:  Recent Labs  09/08/15 1607 09/10/15 0624  WBC 20.9* 14.7*  HGB 15.1* 12.8  HCT 45.1 40.4  PLT 222 203   CMET CMP     Component Value Date/Time   NA 136 09/10/2015 0940   K 3.5 09/10/2015 0940   CL 103 09/10/2015 0940   CO2 20* 09/10/2015 0940   GLUCOSE 215* 09/10/2015 0940   BUN 26* 09/10/2015 0940   CREATININE 1.19* 09/10/2015 0940   CALCIUM 8.2* 09/10/2015 0940   PROT 6.9 09/08/2015 1607   ALBUMIN 3.7 09/08/2015 1607   AST 21 09/08/2015 1607   ALT 21 09/08/2015 1607   ALKPHOS 86 09/08/2015 1607   BILITOT 0.6 09/08/2015 1607   GFRNONAA 48* 09/10/2015 0940   GFRAA 55* 09/10/2015 0940    GFR Estimated Creatinine Clearance: 44.5 mL/min (by C-G formula based on Cr of 1.19). No results for input(s): LIPASE, AMYLASE in the last 72 hours. No results for input(s): CKTOTAL, CKMB, CKMBINDEX, TROPONINI in the last 72 hours. Invalid input(s): POCBNP No results for input(s): DDIMER in the last 72 hours.  Recent Labs  09/09/15 0532  HGBA1C 5.7*    Recent Labs  09/09/15 0532  CHOL 159  HDL 52  LDLCALC 89  TRIG 90  CHOLHDL 3.1   No results for input(s): TSH, T4TOTAL, T3FREE, THYROIDAB in the last 72 hours.  Invalid input(s): FREET3 No results for input(s): VITAMINB12, FOLATE, FERRITIN, TIBC, IRON, RETICCTPCT in the last 72 hours. Coags:  Recent Labs  09/08/15 1607  INR 1.00   Microbiology: Recent Results (from the past 240 hour(s))  Blood culture (routine x 2)     Status: None (Preliminary result)   Collection Time: 09/08/15  6:00 PM  Result Value Ref Range Status   Specimen Description BLOOD LEFT HAND  Final   Special Requests BOTTLES DRAWN AEROBIC AND ANAEROBIC 4CC  Final   Culture NO GROWTH 2 DAYS  Final   Report Status PENDING  Incomplete  Urine culture     Status: None   Collection Time: 09/08/15  6:17 PM  Result Value Ref Range Status     Specimen Description URINE, CLEAN CATCH  Final   Special Requests NONE  Final   Culture MULTIPLE SPECIES PRESENT, SUGGEST RECOLLECTION  Final   Report Status 09/10/2015 FINAL  Final  Blood culture (routine x 2)     Status: None (Preliminary result)   Collection Time: 09/09/15  1:24 AM  Result Value Ref Range Status   Specimen Description BLOOD LEFT ANTECUBITAL  Final   Special Requests BOTTLES DRAWN AEROBIC ONLY 5CC  Final   Culture NO GROWTH 1 DAY  Final   Report Status PENDING  Incomplete     BRIEF HOSPITAL COURSE:  Acute encephalopathy: Suspect secondary to UTI, resolved with IV antibiotics. Currently awake and alert. Doubt CVA/TIA-MRI  and negative for acute CVA (see MRI report) neurology consulted during this hospital stay. No further recommendations from neurology at this time. She recently had a CVA (few weeks prior to admission), will continue with aspirin and statin. She has a nonfocal exam.  UTI: Managed IV Rocephin, encephalopathy resolved. Urine culture shows multiple bacterial morphotypes, since already several days into IV antibiotics-doubt repeat culture would be useful. Since he has responded to Rocephin, we will transition to Keflex for a few days. She is significantly improved,  without fever and without leukocytosis.   Dehydration: Resolved with IV fluids  Prior history of ? Stress cardiomyopathy with history of chronic systolic heart failure: Repeat echo showed significant improvement in ejection fraction. Clinically compensated.  History of seizures: Continue Keppra - seizure-free during this admission  Discharge migraine headaches: Stable, continue with Topamax.  History of depression/anxiety: Stable continue with Effexor and trazodone. Also on Klonopin.  Dyslipidemia: Continue with statin.  Chronic steroids:? Reason-patient also not aware. Continue prednisone at a dose-deferred to PCP and outpatient MDs-since on it for the past several months to years- will  need to be slowly tapered if decision is made to discontinue.  Prior history of subarachnoid hemorrhage-s/p coiling 2015.  TODAY-DAY OF DISCHARGE:  Subjective:   [place] today has no headache,no chest abdominal pain,no new weakness tingling or numbness, feels much better wants to go home today.   Objective:   Blood pressure 130/59, pulse 91, temperature 98.4 F (36.9 C), temperature source Oral, resp. rate 16, height 5\' 3"  (1.6 m), weight 67.2 kg (148 lb 2.4 oz), SpO2 94 %.  Intake/Output Summary (Last 24 hours) at 09/11/15 1034 Last data filed at 09/11/15 0827  Gross per 24 hour  Intake    240 ml  Output      0 ml  Net    240 ml   Filed Weights   09/09/15 0046  Weight: 67.2 kg (148 lb 2.4 oz)    Exam Awake Alert, Oriented *3, No new F.N deficits, Normal affect [place].AT,PERRAL Supple Neck,No JVD, No cervical lymphadenopathy appriciated.  Symmetrical Chest wall movement, Good air movement bilaterally, CTAB RRR,No Gallops,Rubs or new Murmurs, No Parasternal Heave +ve B.Sounds, Abd Soft, Non tender, No organomegaly appriciated, No rebound -guarding or rigidity. No Cyanosis, Clubbing or edema, No new Rash or bruise  DISCHARGE CONDITION: Stable  DISPOSITION: SNF   DISCHARGE INSTRUCTIONS:    Activity:  As tolerated with Full fall precautions use walker/cane & assistance as needed  Get Medicines reviewed and adjusted: Please take all your medications with you for your next visit with your Primary MD  Please request your Primary MD to go over all hospital tests and procedure/radiological results at the follow up, please ask your Primary MD to get all Hospital records sent to his/her office.  If you experience worsening of your admission symptoms, develop shortness of breath, life threatening emergency, suicidal or homicidal thoughts you must seek medical attention immediately by calling 911 or calling your MD immediately  if symptoms less severe.  You must read  complete instructions/literature along with all the possible adverse reactions/side effects for all the Medicines you take and that have been prescribed to you. Take any new Medicines after you have completely understood and accpet all the possible adverse reactions/side effects.   Do not drive when taking Pain medications.   Do not take more than prescribed Pain, Sleep and Anxiety Medications  Special Instructions: If you have smoked or chewed Tobacco  in the last 2  yrs please stop smoking, stop any regular Alcohol  and or any Recreational drug use.  Wear Seat belts while driving.  Please note  You were cared for by a hospitalist during your hospital stay. Once you are discharged, your primary care physician will handle any further medical issues. Please note that NO REFILLS for any discharge medications will be authorized once you are discharged, as it is imperative that you return to your primary care physician (or establish a relationship with a primary care physician if you do not have one) for your aftercare needs so that they can reassess your need for medications and monitor your lab values.   Diet recommendation: Diabetic Diet Heart Healthy diet  Discharge Instructions    Call MD for:  persistant nausea and vomiting    Complete by:  As directed      Call MD for:  severe uncontrolled pain    Complete by:  As directed      Diet Carb Modified    Complete by:  As directed      Increase activity slowly    Complete by:  As directed            Follow-up Information    Please follow up.   Why:  2 weeks after discharge from SNF   Contact information:   PCP     Total Time spent on discharge equals  45 minutes.  SignedOren Binet 09/11/2015 10:34 AM

## 2015-09-11 NOTE — Discharge Summary (Signed)
PATIENT DETAILS Name: Mindy Mills Age: 64 y.o. Sex: female Date of Birth: Oct 24, 1951 MRN: [id number]. Admitting Physician: Toy Baker, MD PCP:No primary care provider on file.  Admit Date: 09/08/2015 Discharge date: 09/11/2015  Recommendations for Outpatient Follow-up:  1. Please check CBC and chemistries in 1 week.  PRIMARY DISCHARGE DIAGNOSIS: Active Problems:  Fall  UTI (lower urinary tract infection)  Dehydration  Sepsis  Systolic heart failure  Confusion  TIA (transient ischemic attack)    PAST MEDICAL HISTORY: Past Medical History  Diagnosis Date  . Hypertension   . Migraine   . Cancer     Cervicle  . Hepatitis C   . Stroke     DISCHARGE MEDICATIONS: Current Discharge Medication List    START taking these medications   Details  cephALEXin (KEFLEX) 500 MG capsule Take 1 capsule (500 mg total) by mouth 3 (three) times daily. For 3 more days from 9/14 and then stop    pantoprazole (PROTONIX) 40 MG tablet Take 1 tablet (40 mg total) by mouth at bedtime.      CONTINUE these medications which have CHANGED   Details  clonazePAM (KLONOPIN) 0.5 MG tablet Take 1 tablet (0.5 mg total) by mouth 2 (two) times daily. Qty: 20 tablet, Refills: 0    feeding supplement, ENSURE ENLIVE, (ENSURE ENLIVE) LIQD Take 237 mLs by mouth daily.      CONTINUE these medications which have NOT CHANGED   Details  aspirin 325 MG EC tablet Take 325 mg by mouth daily.    atorvastatin (LIPITOR) 20 MG tablet Take 20 mg by mouth daily at 6 PM.    EPINEPHrine (EPIPEN) 0.3 mg/0.3 mL IJ SOAJ injection Inject 0.3 mg into the muscle once as needed (for allergic reaction).    levETIRAcetam (KEPPRA) 500 MG tablet Take 500 mg by mouth 3 (three) times daily.    mirtazapine (REMERON) 15 MG tablet Take 15 mg by mouth at bedtime.    predniSONE (DELTASONE) 5 MG tablet Take 5 mg by mouth daily with breakfast.     topiramate (TOPAMAX) 25 MG tablet Take 50 mg by mouth at bedtime.    traZODone (DESYREL) 50 MG tablet Take 50 mg by mouth at bedtime.     venlafaxine XR (EFFEXOR-XR) 75 MG 24 hr capsule Take 225 mg by mouth daily with breakfast.      STOP taking these medications     meloxicam (MOBIC) 15 MG tablet      pantoprazole (PROTONIX) 40 MG injection      promethazine (PHENERGAN) 25 MG tablet      albuterol (PROVENTIL) (2.5 MG/3ML) 0.083% nebulizer solution      bisacodyl (DULCOLAX) 10 MG suppository      budesonide (PULMICORT) 0.5 MG/2ML nebulizer solution      ipratropium-albuterol (DUONEB) 0.5-2.5 (3) MG/3ML SOLN      levETIRAcetam (KEPRRA) 500 MG/100ML SOLN      NiMODipine (NYMALIZE) 60 MG/20ML SOLN      Water For Irrigation, Sterile (FREE WATER) SOLN         ALLERGIES:  Allergies  Allergen Reactions  . Bee Venom Anaphylaxis and Hives    BRIEF HPI: See H&P, Labs, Consult and Test reports for all details in brief, patient was admitted for evaluation of encephalopathy.  CONSULTATIONS:  neurology  PERTINENT RADIOLOGIC STUDIES:  Imaging Results    Dg Chest 2 View  09/08/2015 CLINICAL DATA: Confusion. EXAM: CHEST 2 VIEW COMPARISON: September 24, 2014. FINDINGS: The heart size and mediastinal contours are within normal limits.  No pneumothorax is noted. Left lung is clear. No significant pleural effusion is noted. Mild right basilar opacity is noted concerning for subsegmental atelectasis. The visualized skeletal structures are unremarkable. IMPRESSION: Probable mild right basilar subsegmental atelectasis. Electronically Signed By: Marijo Conception, M.D. On: 09/08/2015 17:43   Ct Head Wo Contrast  09/08/2015 CLINICAL DATA: Difficulty walking, recent ischemic stroke 9 days ago EXAM: CT HEAD WITHOUT CONTRAST TECHNIQUE: Contiguous axial images were obtained from the base of the skull through the  vertex without intravenous contrast. COMPARISON: 10/07/2014 FINDINGS: The bony calvarium is intact. There are changes consistent with prior coiling which are stable from the prior exam. Scattered areas of decreased attenuation are noted consistent with prior white matter ischemic change. No findings to suggest acute hemorrhage, acute infarction or space-occupying mass lesion are noted. IMPRESSION: Chronic ischemic changes and changes of prior aneurysm coiling. No acute abnormality noted. Electronically Signed By: Inez Catalina M.D. On: 09/08/2015 17:23   Mr Brain Wo Contrast  09/09/2015 ADDENDUM REPORT: 09/09/2015 07:46 ADDENDUM: In addition to the initially described findings, these scattered foci of high signal intensity seen on DWI sequence within the deep white matter of both cerebral hemispheres could potentially reflect subacute infarcts as well, as no definite ADC correlate is seen, although many of these foci are so small that a discrete ADC correlate would be difficult to discern. Correlation with history and comparison with prior imaging if available may be helpful in this regard. Electronically Signed By: Jeannine Boga M.D. On: 09/09/2015 07:46   09/09/2015 CLINICAL DATA: Initial evaluation for left-sided facial droop. EXAM: MRI HEAD WITHOUT CONTRAST MRA HEAD WITHOUT CONTRAST TECHNIQUE: Multiplanar, multiecho pulse sequences of the brain and surrounding structures were obtained without intravenous contrast. Angiographic images of the head were obtained using MRA technique without contrast. COMPARISON: Prior CT from earlier the same day. FINDINGS: MRI HEAD FINDINGS There are several scattered small patchy foci of high signal intensity on DWI sequence involving the deep white matter of the bilateral centrum semi ovale, suspicious for possible small acute ischemic changes. There are is probable corresponding signal loss on ADC map seen about several of these foci,  although some of these likely reflect T2 shine through, particularly within the left centrum semi ovale. Few scattered chronic lacunar infarcts present within this region. No associated hemorrhage or mass effect. No other infarct. Normal intravascular flow voids maintained. Mild to moderate chronic small vessel ischemic changes present within the periventricular and deep white matter. No mass lesion, midline shift, or mass effect. No hydrocephalus. No extra-axial fluid collection. Scattered chronic hemosiderin staining seen about the sylvian fissures bilaterally as well as at the occipital horns of both lateral ventricles, likely related to episode of previous subarachnoid hemorrhage. Craniocervical junction within normal limits. Mild degenerative changes noted within the partially visualized upper cervical spine. Incidental note made of a partially empty sella. No acute abnormality about the orbits. Paranasal sinuses are clear. No mastoid effusion. Inner ear structures grossly normal. Bone marrow signal intensity within normal limits. Scalp soft tissues unremarkable. MRA HEAD FINDINGS ANTERIOR CIRCULATION: Visualized distal cervical segments of the internal carotid arteries are patent with antegrade flow. Petrous segments widely patent. Cavernous and supraclinoid segments well opacified. A1 segments widely patent. 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 No residual aneurysm appreciated. Anterior cerebral arteries well opacified. M1 segments patent without occlusion or high-grade stenosis. Mild atheromatous irregularity within the M1 segments bilaterally. MCA bifurcations normal. Distal MCA branches symmetric. POSTERIOR CIRCULATION: Vertebral arteries  widely patent to the vertebrobasilar junction. Posterior inferior cerebral arteries well opacified. Basilar artery widely patent. Superior cerebellar arteries opacified proximally. Both the posterior cerebral arteries  arise from the basilar artery and are well opacified to their distal aspects. No new aneurysm or vascular malformation. IMPRESSION: MRI HEAD IMPRESSION: 1. Patchy high signal intensity within the deep white matter of the bilateral centrum semi ovale, suspicious for possible small vessel ischemia. Please note that a portion of these findings main reflect T2 shine through/artifact from underlying chronic small vessel ischemic disease. Correlation with symptomatology and physical exam recommended. 2. Mild to moderate chronic small vessel ischemic disease. MRA HEAD IMPRESSION: 1. No large or proximal arterial branch occlusion identified within the intracranial circulation. No high-grade or correctable stenosis identified. 2. 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 No residual aneurysm identified. Electronically Signed: By: Jeannine Boga M.D. On: 09/09/2015 01:38   Ct Abdomen Pelvis W Contrast  09/08/2015 CLINICAL DATA: Right lower quadrant pain EXAM: CT ABDOMEN AND PELVIS WITH CONTRAST TECHNIQUE: Multidetector CT imaging of the abdomen and pelvis was performed using the standard protocol following bolus administration of intravenous contrast. CONTRAST: 156mL OMNIPAQUE IOHEXOL 300 MG/ML SOLN COMPARISON: None. FINDINGS: Lower chest and abdominal wall: Large sliding hiatal hernia with retention of oral contrast. Patchy ground-glass opacity and bandlike opacities in the right lower lobe most consistent with atelectasis. Extensive coronary atherosclerotic calcification. Hepatobiliary: No focal liver abnormality.No evidence of biliary obstruction or stone. Pancreas: Borderline main duct enlargement at 3 mm. No visible mass or inflammation. Spleen: Unremarkable. Adrenals/Urinary Tract: Negative adrenals. Left nephrolithiasis, including a 20 mm nonobstructing, branching stone in the pelvis. Smooth urothelial left renal pelvis asymmetric thickening, presumably from irritation.  Unremarkable bladder. Reproductive:Hysterectomy and possible oophorectomies. Stomach/Bowel: No obstruction. Appendix not identified. No indication of appendicitis. Vascular/Lymphatic: Diffuse atherosclerotic calcification. No acute vascular abnormality. No mass or adenopathy. Peritoneal: No ascites or pneumoperitoneum. Musculoskeletal: No acute abnormalities. Multilevel facet arthropathy and disc degeneration. IMPRESSION: 1. No explanation for right lower quadrant pain. 2. Urothelial thickening at the left renal pelvis, presumably related to branching 2 cm calculus. Urinalysis correlation recommended. 3. Right lower lobe atelectasis. 4. Large sliding hiatal hernia. Electronically Signed By: Monte Fantasia M.D. On: 09/08/2015 17:35   Dg Knee Complete 4 Views Right  09/08/2015 CLINICAL DATA: Confusion. Shaft wing gait and slurred speech. EXAM: RIGHT KNEE - COMPLETE 4+ VIEW COMPARISON: None FINDINGS: No joint effusion. No fracture or subluxation. 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 There is no fracture or subluxation identified. IMPRESSION: 1. No acute findings. 2. Osteoarthritis. Electronically Signed By: Kerby Moors M.D. On: 09/08/2015 17:45   Mr Jodene Nam Head/brain Wo Cm  09/09/2015 ADDENDUM REPORT: 09/09/2015 07:46 ADDENDUM: In addition to the initially described findings, these scattered foci of high signal intensity seen on DWI sequence within the deep white matter of both cerebral hemispheres could potentially reflect subacute infarcts as well, as no definite ADC correlate is seen, although many of these foci are so small that a discrete ADC correlate would be difficult to discern. Correlation with history and comparison with prior imaging if available may be helpful in this regard. Electronically Signed By: Jeannine Boga M.D. On: 09/09/2015 07:46   09/09/2015 CLINICAL DATA: Initial evaluation for left-sided facial droop. EXAM: MRI HEAD  WITHOUT CONTRAST MRA HEAD WITHOUT CONTRAST TECHNIQUE: Multiplanar, multiecho pulse sequences of the brain and surrounding structures were obtained without intravenous contrast. Angiographic images of the head were obtained using MRA technique without contrast. COMPARISON: Prior CT from earlier the same  day. FINDINGS: MRI HEAD FINDINGS There are several scattered small patchy foci of high signal intensity on DWI sequence involving the deep white matter of the bilateral centrum semi ovale, suspicious for possible small acute ischemic changes. There are is probable corresponding signal loss on ADC map seen about several of these foci, although some of these likely reflect T2 shine through, particularly within the left centrum semi ovale. Few scattered chronic lacunar infarcts present within this region. No associated hemorrhage or mass effect. No other infarct. Normal intravascular flow voids maintained. Mild to moderate chronic small vessel ischemic changes present within the periventricular and deep white matter. No mass lesion, midline shift, or mass effect. No hydrocephalus. No extra-axial fluid collection. Scattered chronic hemosiderin staining seen about the sylvian fissures bilaterally as well as at the occipital horns of both lateral ventricles, likely related to episode of previous subarachnoid hemorrhage. Craniocervical junction within normal limits. Mild degenerative changes noted within the partially visualized upper cervical spine. Incidental note made of a partially empty sella. No acute abnormality about the orbits. Paranasal sinuses are clear. No mastoid effusion. Inner ear structures grossly normal. Bone marrow signal intensity within normal limits. Scalp soft tissues unremarkable. MRA HEAD FINDINGS ANTERIOR CIRCULATION: Visualized distal cervical segments of the internal carotid arteries are patent with antegrade flow. Petrous segments widely patent. Cavernous and supraclinoid segments  well opacified. A1 segments widely patent. 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 No residual aneurysm appreciated. Anterior cerebral arteries well opacified. M1 segments patent without occlusion or high-grade stenosis. Mild atheromatous irregularity within the M1 segments bilaterally. MCA bifurcations normal. Distal MCA branches symmetric. POSTERIOR CIRCULATION: Vertebral arteries widely patent to the vertebrobasilar junction. Posterior inferior cerebral arteries well opacified. Basilar artery widely patent. Superior cerebellar arteries opacified proximally. Both the posterior cerebral arteries arise from the basilar artery and are well opacified to their distal aspects. No new aneurysm or vascular malformation. IMPRESSION: MRI HEAD IMPRESSION: 1. Patchy high signal intensity within the deep white matter of the bilateral centrum semi ovale, suspicious for possible small vessel ischemia. Please note that a portion of these findings main reflect T2 shine through/artifact from underlying chronic small vessel ischemic disease. Correlation with symptomatology and physical exam recommended. 2. Mild to moderate chronic small vessel ischemic disease. MRA HEAD IMPRESSION: 1. No large or proximal arterial branch occlusion identified within the intracranial circulation. No high-grade or correctable stenosis identified. 2. 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 No residual aneurysm identified. Electronically Signed: By: Jeannine Boga M.D. On: 09/09/2015 01:38   Dg Hip Unilat With Pelvis 2-3 Views Right  09/08/2015 CLINICAL DATA: Shuffling gait and recent fall with right hip pain, initial encounter EXAM: DG HIP (WITH OR WITHOUT PELVIS) 2-3V RIGHT COMPARISON: None. FINDINGS: Contrast material is noted within the collecting systems and bladder related to the recent CT examination. The pelvic ring is intact. No acute fracture or dislocation is  noted. Mild degenerative changes of the right hip joint are seen. No soft tissue abnormality is noted. IMPRESSION: No acute abnormality seen. Electronically Signed By: Inez Catalina M.D. On: 09/08/2015 17:43      PERTINENT LAB RESULTS: CBC:  Recent Labs (last 2 labs)      Recent Labs  09/08/15 1607 09/10/15 0624  WBC 20.9* 14.7*  HGB 15.1* 12.8  HCT 45.1 40.4  PLT 222 203     CMET CMP  Labs (Brief)       Component Value Date/Time   NA 136 09/10/2015 0940  K 3.5 09/10/2015 0940   CL 103 09/10/2015 0940   CO2 20* 09/10/2015 0940   GLUCOSE 215* 09/10/2015 0940   BUN 26* 09/10/2015 0940   CREATININE 1.19* 09/10/2015 0940   CALCIUM 8.2* 09/10/2015 0940   PROT 6.9 09/08/2015 1607   ALBUMIN 3.7 09/08/2015 1607   AST 21 09/08/2015 1607   ALT 21 09/08/2015 1607   ALKPHOS 86 09/08/2015 1607   BILITOT 0.6 09/08/2015 1607   GFRNONAA 48* 09/10/2015 0940   GFRAA 55* 09/10/2015 0940      GFR Estimated Creatinine Clearance: 44.5 mL/min (by C-G formula based on Cr of 1.19).  Recent Labs (last 2 labs)     No results for input(s): LIPASE, AMYLASE in the last 72 hours.    Recent Labs (last 2 labs)     No results for input(s): CKTOTAL, CKMB, CKMBINDEX, TROPONINI in the last 72 hours.    Recent Labs (last 2 labs)     Invalid input(s): POCBNP    Recent Labs (last 2 labs)     No results for input(s): DDIMER in the last 72 hours.    Recent Labs (last 2 labs)      Recent Labs  09/09/15 0532  HGBA1C 5.7*      Recent Labs (last 2 labs)      Recent Labs  09/09/15 0532  CHOL 159  HDL 52  LDLCALC 89  TRIG 90  CHOLHDL 3.1      Recent Labs (last 2 labs)     No results for input(s): TSH, T4TOTAL, T3FREE, THYROIDAB in the last 72 hours.  Invalid input(s): FREET3    Recent Labs (last 2 labs)     No results for input(s): VITAMINB12, FOLATE, FERRITIN, TIBC, IRON,  RETICCTPCT in the last 72 hours.   Coags:  Recent Labs (last 2 labs)      Recent Labs  09/08/15 1607  INR 1.00     Microbiology: Recent Results (from the past 240 hour(s))  Blood culture (routine x 2) Status: None (Preliminary result)   Collection Time: 09/08/15 6:00 PM  Result Value Ref Range Status   Specimen Description BLOOD LEFT HAND  Final   Special Requests BOTTLES DRAWN AEROBIC AND ANAEROBIC 4CC  Final   Culture NO GROWTH 2 DAYS  Final   Report Status PENDING  Incomplete  Urine culture Status: None   Collection Time: 09/08/15 6:17 PM  Result Value Ref Range Status   Specimen Description URINE, CLEAN CATCH  Final   Special Requests NONE  Final   Culture MULTIPLE SPECIES PRESENT, SUGGEST RECOLLECTION  Final   Report Status 09/10/2015 FINAL  Final  Blood culture (routine x 2) Status: None (Preliminary result)   Collection Time: 09/09/15 1:24 AM  Result Value Ref Range Status   Specimen Description BLOOD LEFT ANTECUBITAL  Final   Special Requests BOTTLES DRAWN AEROBIC ONLY 5CC  Final   Culture NO GROWTH 1 DAY  Final   Report Status PENDING  Incomplete     BRIEF HOSPITAL COURSE:  Acute encephalopathy: Suspect secondary to UTI, resolved with IV antibiotics. Currently awake and alert. Doubt CVA/TIA-MRI and negative for acute CVA (see MRI report) neurology consulted during this hospital stay. No further recommendations from neurology at this time. She recently had a CVA (few weeks prior to admission), will continue with aspirin and statin. She has a nonfocal exam.  UTI: Managed IV Rocephin, encephalopathy resolved. Urine culture shows multiple bacterial morphotypes, since already several days into IV antibiotics-doubt repeat culture would  be useful. Since he has responded to Rocephin, we will transition to Keflex for a few days. She is significantly improved, without fever  and without leukocytosis.   Dehydration: Resolved with IV fluids  Prior history of ? Stress cardiomyopathy with history of chronic systolic heart failure: Repeat echo showed significant improvement in ejection fraction. Clinically compensated.  History of seizures: Continue Keppra - seizure-free during this admission  Discharge migraine headaches: Stable, continue with Topamax.  History of depression/anxiety: Stable continue with Effexor and trazodone. Also on Klonopin.  Dyslipidemia: Continue with statin.  Chronic steroids:? Reason-patient also not aware. Continue prednisone at a dose-deferred to PCP and outpatient MDs-since on it for the past several months to years- will need to be slowly tapered if decision is made to discontinue.  Prior history of subarachnoid hemorrhage-s/p coiling 2015.  TODAY-DAY OF DISCHARGE:  Subjective:   Marjani Kobel today has no headache,no chest abdominal pain,no new weakness tingling or numbness, feels much better wants to go home today.   Objective:   Blood pressure 130/59, pulse 91, temperature 98.4 F (36.9 C), temperature source Oral, resp. rate 16, height 5\' 3"  (1.6 m), weight 67.2 kg (148 lb 2.4 oz), SpO2 94 %.  Intake/Output Summary (Last 24 hours) at 09/11/15 1034 Last data filed at 09/11/15 0827  Gross per 24 hour  Intake  240 ml  Output  0 ml  Net  240 ml   Filed Weights   09/09/15 0046  Weight: 67.2 kg (148 lb 2.4 oz)    Exam Awake Alert, Oriented *3, No new F.N deficits, Normal affect [place].AT,PERRAL Supple Neck,No JVD, No cervical lymphadenopathy appriciated.  Symmetrical Chest wall movement, Good air movement bilaterally, CTAB RRR,No Gallops,Rubs or new Murmurs, No Parasternal Heave +ve B.Sounds, Abd Soft, Non tender, No organomegaly appriciated, No rebound -guarding or rigidity. No Cyanosis, Clubbing or edema, No new Rash or bruise  DISCHARGE  CONDITION: Stable  DISPOSITION: SNF   DISCHARGE INSTRUCTIONS:  Activity:  As tolerated with Full fall precautions use walker/cane & assistance as needed  Get Medicines reviewed and adjusted: Please take all your medications with you for your next visit with your Primary MD  Please request your Primary MD to go over all hospital tests and procedure/radiological results at the follow up, please ask your Primary MD to get all Hospital records sent to his/her office.  If you experience worsening of your admission symptoms, develop shortness of breath, life threatening emergency, suicidal or homicidal thoughts you must seek medical attention immediately by calling 911 or calling your MD immediately if symptoms less severe.  You must read complete instructions/literature along with all the possible adverse reactions/side effects for all the Medicines you take and that have been prescribed to you. Take any new Medicines after you have completely understood and accpet all the possible adverse reactions/side effects.   Do not drive when taking Pain medications.   Do not take more than prescribed Pain, Sleep and Anxiety Medications  Special Instructions: If you have smoked or chewed Tobacco in the last 2 yrs please stop smoking, stop any regular Alcohol and or any Recreational drug use.  Wear Seat belts while driving.  Please note  You were cared for by a hospitalist during your hospital stay. Once you are discharged, your primary care physician will handle any further medical issues. Please note that NO REFILLS for any discharge medications will be authorized once you are discharged, as it is imperative that you return to your primary care physician (or establish  a relationship with a primary care physician if you do not have one) for your aftercare needs so that they can reassess your need for medications and monitor your lab values.   Diet recommendation: Diabetic Diet Heart Healthy  diet  Discharge Instructions    Call MD for: persistant nausea and vomiting  Complete by: As directed      Call MD for: severe uncontrolled pain  Complete by: As directed      Diet Carb Modified  Complete by: As directed      Increase activity slowly  Complete by: As directed            Follow-up Information    Please follow up.   Why: 2 weeks after discharge from SNF   Contact information:   PCP     Total Time spent on discharge equals 45 minutes.  SignedOren Binet 09/11/2015 10:34 AM

## 2015-09-11 NOTE — Clinical Social Work Placement (Signed)
   CLINICAL SOCIAL WORK PLACEMENT  NOTE  Date:  09/11/2015  Patient Details  Name: Mindy Mills MRN: [id number] Date of Birth: 1951-07-05  Clinical Social Work is seeking post-discharge placement for this patient at the [place] level of care (*CSW will initial, date and re-position this form in  chart as items are completed):  Yes   Patient/family provided with [facility] Work Department's list of facilities offering this level of care within the geographic area requested by the patient (or if unable, by the patient's family).  Yes   Patient/family informed of their freedom to choose among providers that offer the needed level of care, that participate in Medicare, Medicaid or managed care program needed by the patient, have an available bed and are willing to accept the patient.  Yes   Patient/family informed of [facility]'s ownership interest in [facility] and [facility], as well as of the fact that they are under no obligation to receive care at these facilities.  PASRR submitted to EDS on       PASRR number received on       Existing PASRR number confirmed on 09/09/15     FL2 transmitted to all facilities in geographic area requested by pt/family on 09/09/15     FL2 transmitted to all facilities within larger geographic area on       Patient informed that his/her managed care company has contracts with or will negotiate with certain facilities, including the following:        Yes   Patient/family informed of bed offers received.  Patient chooses bed at  [facility] )     Physician recommends and patient chooses bed at      Patient to be transferred to  [facility] ) on 09/11/15.  Patient to be transferred to facility by  [facility] )     Patient family notified on 09/11/15 of transfer.  Name of family member notified:   (Pt's dtr-in-law, Polo Riley )     PHYSICIAN Please sign FL2,  Please prepare priority discharge summary, including medications     Additional Comment:    _______________________________________________ Glendon Axe, MSW, LCSWA [phone] 09/11/2015 11:00 AM

## 2015-09-11 NOTE — Clinical Social Work Note (Signed)
Patient's family chooses bed at [facility]. CSW has notified facility.   CSW remains available as needed.   Glendon Axe, MSW, LCSWA [phone] 09/11/2015 10:07 AM

## 2015-09-13 LAB — CULTURE, BLOOD (ROUTINE X 2): Culture: NO GROWTH

## 2015-09-14 LAB — CULTURE, BLOOD (ROUTINE X 2): Culture: NO GROWTH

## 2017-01-11 ENCOUNTER — Ambulatory Visit (INDEPENDENT_AMBULATORY_CARE_PROVIDER_SITE_OTHER): Payer: Medicare Other

## 2017-01-11 ENCOUNTER — Ambulatory Visit (HOSPITAL_COMMUNITY)
Admission: EM | Admit: 2017-01-11 | Discharge: 2017-01-11 | Disposition: A | Discharge status: Home / Self Care | Payer: Medicare Other | Attending: Emergency Medicine | Admitting: Emergency Medicine

## 2017-01-11 ENCOUNTER — Encounter (HOSPITAL_COMMUNITY): Payer: Self-pay | Admitting: *Deleted

## 2017-01-11 DIAGNOSIS — S40011A Contusion of right shoulder, initial encounter: Secondary | ICD-10-CM

## 2017-01-11 DIAGNOSIS — S60221A Contusion of right hand, initial encounter: Secondary | ICD-10-CM | POA: Diagnosis not present

## 2017-01-11 DIAGNOSIS — S40211A Abrasion of right shoulder, initial encounter: Secondary | ICD-10-CM | POA: Diagnosis not present

## 2017-01-11 DIAGNOSIS — W010XXA Fall on same level from slipping, tripping and stumbling without subsequent striking against object, initial encounter: Secondary | ICD-10-CM

## 2017-01-11 MED ORDER — POVIDONE-IODINE 10 % EX SOLN
CUTANEOUS | Status: AC
  Filled 2017-01-11: qty 236

## 2017-01-11 MED ORDER — BACITRACIN ZINC 500 UNIT/GM EX OINT
TOPICAL_OINTMENT | CUTANEOUS | Status: AC
  Filled 2017-01-11: qty 9

## 2017-01-11 NOTE — ED Notes (Signed)
Abrasion on patient's right hand cleaned with wound cleanser and dressed with a band aid.

## 2017-01-11 NOTE — ED Provider Notes (Signed)
CSN: [id number]     Arrival date & time 01/11/17  1215 History   First MD Initiated Contact with Patient 01/11/17 1316     Chief Complaint  Patient presents with  . Hand Injury   (Consider location/radiation/quality/duration/timing/severity/associated sxs/prior Treatment) 66 year old female states that she was exiting her car with a bag of groceries fell down onto her right hand yesterday. She is complaining of pain to the right hand primarily along the fifth metacarpal and digit. She is able to make a fist, extend the digits, flex the wrist and extend the wrist. She also complains of bruising to the right anterior shoulder. There is localized tenderness but she is able to provide full range of motion.      Past Medical History:  Diagnosis Date  . 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 Hepatitis C   . Hypertension   . Migraine   . Stroke [facility])    Past Surgical History:  Procedure Laterality Date  . ABDOMINAL HYSTERECTOMY    . PEG PLACEMENT N/A 09/21/2014   Procedure: PERCUTANEOUS ENDOSCOPIC GASTROSTOMY (PEG) PLACEMENT (BED SIDE);  Surgeon: Georganna Skeans, MD;  Location: [place];  Service: General;  Laterality: N/A;  . RADIOLOGY WITH ANESTHESIA N/A 09/11/2014   Procedure: Sub Arachnoid Aneurysm Coiling -RADIOLOGY WITH ANESTHESIA ;  Surgeon: Consuella Lose, MD;  Location: [facility];  Service: Radiology;  Laterality: N/A;   Family History  Problem Relation Age of Onset  . Alcohol abuse Father    Social History  Substance Use Topics  . Smoking status: Former Smoker    Packs/day: 0.50    Types: Cigarettes    Quit date: 09/06/2015  . Smokeless tobacco: Not on file  . Alcohol use Yes     Comment: rarely   OB History    No data available     Review of Systems  Constitutional: Negative for activity change, chills and fever.  HENT: Negative.   Respiratory: Negative.   Cardiovascular: Negative.   Musculoskeletal: Negative for back pain, neck pain and neck stiffness.       As per HPI   Skin: Negative for color change, pallor and rash.  Neurological: Negative.   All other systems reviewed and are negative.   Allergies  Bee venom  Home Medications   Prior to Admission medications   Medication Sig Start Date End Date Taking? Authorizing Provider  aspirin 325 MG EC tablet Take 325 mg by mouth daily.    Historical Provider, MD  atorvastatin (LIPITOR) 20 MG tablet Take 20 mg by mouth daily at 6 PM.    Historical Provider, MD  cephALEXin (KEFLEX) 500 MG capsule Take 1 capsule (500 mg total) by mouth 3 (three) times daily. For 3 more days from 9/14 and then stop 09/11/15   Jonetta Osgood, MD  clonazePAM (KLONOPIN) 0.5 MG tablet Take 1 tablet (0.5 mg total) by mouth 2 (two) times daily. 09/11/15   Shanker Kristeen Mans, MD  EPINEPHrine (EPIPEN) 0.3 mg/0.3 mL IJ SOAJ injection Inject 0.3 mg into the muscle once as needed (for allergic reaction).    Historical Provider, MD  feeding supplement, ENSURE ENLIVE, (ENSURE ENLIVE) LIQD Take 237 mLs by mouth daily. 09/11/15   Shanker Kristeen Mans, MD  levETIRAcetam (KEPPRA) 500 MG tablet Take 500 mg by mouth 3 (three) times daily.    Historical Provider, MD  mirtazapine (REMERON) 15 MG tablet Take 15 mg by mouth at bedtime.    Historical Provider, MD  pantoprazole (PROTONIX) 40 MG tablet  Take 1 tablet (40 mg total) by mouth at bedtime. 09/11/15   Shanker Kristeen Mans, MD  predniSONE (DELTASONE) 5 MG tablet Take 5 mg by mouth daily with breakfast.    Historical Provider, MD  topiramate (TOPAMAX) 25 MG tablet Take 50 mg by mouth at bedtime.    Historical Provider, MD  traZODone (DESYREL) 50 MG tablet Take 50 mg by mouth at bedtime.     Historical Provider, MD  venlafaxine XR (EFFEXOR-XR) 75 MG 24 hr capsule Take 225 mg by mouth daily with breakfast.    Historical Provider, MD   Meds Ordered and Administered this Visit  Medications - No data to display  BP 130/70 (BP Location: Right Arm)   Pulse 78   Temp 98.6 F (37 C) (Oral)   Resp 18   SpO2  100%  No data found.   Physical Exam  Constitutional: She is oriented to person, place, and time. She appears well-developed and well-nourished. No distress.  HENT:  Head: Normocephalic and atraumatic.  Eyes: EOM are normal.  Neck: Normal range of motion. Neck supple.  Cardiovascular: Normal rate.   Pulmonary/Chest: Effort normal.  Musculoskeletal: She exhibits edema and tenderness.  Right hand tenderness over the fifth metacarpal and fifth MCP and proximal phalanx. Patient able to oppose thumb completely. Able to flex and extend digits, flex and extend wrist. Distal neurovascular motor sensory is grossly intact. Right shoulder with anterior ecchymosis just distal to the joint line over the head of the humerus. No deformity or swelling. Able to abduct arm to chest above 90 able to extend arm completely forward 90 and place arm behind her back. No shoulder asymmetry.  Lymphadenopathy:    She has no cervical adenopathy.  Neurological: She is alert and oriented to person, place, and time. No cranial nerve deficit.  Skin: Skin is warm and dry.  Abrasion to the right anterior shoulder.  Psychiatric: She has a normal mood and affect.  Nursing note and vitals reviewed.   Urgent Care Course   Clinical Course     Procedures (including critical care time)  Labs Review Labs Reviewed - No data to display  Imaging Review Dg Hand Complete Right  Result Date: 01/11/2017 CLINICAL DATA:  Patient fell yesterday placing the all her weight on her right hand. Pain and tenderness along the lateral side of hand. EXAM: RIGHT HAND - COMPLETE 3+ VIEW COMPARISON:  None. FINDINGS: There is mild soft tissue prominence along the ulnar side of the hand and wrist. There is joint space narrowing at the base of the first and fifth metacarpals likely degenerative in etiology as well as the triscaphe articulation of the wrist. Lesser degree of joint space narrowing of the DIP and PIP joints of the second through  fifth digits. No acute fracture nor bone destruction. IMPRESSION: No acute osseous abnormality. Mild soft tissue prominence/swelling along the ulnar aspect of the hand and wrist. Electronically Signed   By: Ashley Royalty M.D.   On: 01/11/2017 13:59     Visual Acuity Review  Right Eye Distance:   Left Eye Distance:   Bilateral Distance:    Right Eye Near:   Left Eye Near:    Bilateral Near:         MDM   1. Contusion of right hand, initial encounter   2. Fall from slip, trip, or stumble, initial encounter   3. Contusion of right shoulder, initial encounter   4. Abrasion of right shoulder, initial encounter    If  the abrasion clean and dry with soap and water. Wear the splint to the right hand and wrist for about 3 days or so. Keep it elevated and apply ice. He may remove the brace periodically to move the fingers and the hand around somewhat does not get stiff. Follow-up with your primary care doctor as needed. For worsening may return.     Janne Napoleon, NP 01/11/17 1454

## 2017-01-11 NOTE — Discharge Instructions (Signed)
If the abrasion clean and dry with soap and water. Wear the splint to the right hand and wrist for about 3 days or so. Keep it elevated and apply ice. He may remove the brace periodically to move the fingers and the hand around somewhat does not get stiff. Follow-up with your primary care doctor as needed. For worsening may return.

## 2017-01-11 NOTE — ED Triage Notes (Signed)
Mindy Mills   And  inj  r  Hand    Pain  And   Swelling  With a  Little  Bit  Of a  Bruise

## 2017-06-21 ENCOUNTER — Emergency Department (HOSPITAL_BASED_OUTPATIENT_CLINIC_OR_DEPARTMENT_OTHER): Payer: Medicare Other

## 2017-06-21 ENCOUNTER — Emergency Department (HOSPITAL_BASED_OUTPATIENT_CLINIC_OR_DEPARTMENT_OTHER)
Admission: EM | Admit: 2017-06-21 | Discharge: 2017-06-21 | Disposition: A | Discharge status: Home / Self Care | Payer: Medicare Other | Attending: Emergency Medicine | Admitting: Emergency Medicine

## 2017-06-21 ENCOUNTER — Encounter (HOSPITAL_BASED_OUTPATIENT_CLINIC_OR_DEPARTMENT_OTHER): Payer: Self-pay

## 2017-06-21 DIAGNOSIS — S80211A Abrasion, right knee, initial encounter: Secondary | ICD-10-CM | POA: Insufficient documentation

## 2017-06-21 DIAGNOSIS — Z87891 Personal history of nicotine dependence: Secondary | ICD-10-CM | POA: Diagnosis not present

## 2017-06-21 DIAGNOSIS — Y9301 Activity, walking, marching and hiking: Secondary | ICD-10-CM | POA: Insufficient documentation

## 2017-06-21 DIAGNOSIS — Z79899 Other long term (current) drug therapy: Secondary | ICD-10-CM | POA: Diagnosis not present

## 2017-06-21 DIAGNOSIS — I110 Hypertensive heart disease with heart failure: Secondary | ICD-10-CM | POA: Diagnosis not present

## 2017-06-21 DIAGNOSIS — S02631A Fracture of coronoid process of right mandible, initial encounter for closed fracture: Secondary | ICD-10-CM | POA: Diagnosis not present

## 2017-06-21 DIAGNOSIS — Y929 Unspecified place or not applicable: Secondary | ICD-10-CM | POA: Insufficient documentation

## 2017-06-21 DIAGNOSIS — Z23 Encounter for immunization: Secondary | ICD-10-CM | POA: Insufficient documentation

## 2017-06-21 DIAGNOSIS — Y999 Unspecified external cause status: Secondary | ICD-10-CM | POA: Insufficient documentation

## 2017-06-21 DIAGNOSIS — Z7982 Long term (current) use of aspirin: Secondary | ICD-10-CM | POA: Insufficient documentation

## 2017-06-21 DIAGNOSIS — S02612A Fracture of condylar process of left mandible, initial encounter for closed fracture: Secondary | ICD-10-CM | POA: Insufficient documentation

## 2017-06-21 DIAGNOSIS — S0993XA Unspecified injury of face, initial encounter: Secondary | ICD-10-CM | POA: Diagnosis present

## 2017-06-21 DIAGNOSIS — S02611A Fracture of condylar process of right mandible, initial encounter for closed fracture: Secondary | ICD-10-CM | POA: Insufficient documentation

## 2017-06-21 DIAGNOSIS — Z8541 Personal history of malignant neoplasm of cervix uteri: Secondary | ICD-10-CM | POA: Diagnosis not present

## 2017-06-21 DIAGNOSIS — W19XXXA Unspecified fall, initial encounter: Secondary | ICD-10-CM

## 2017-06-21 DIAGNOSIS — W0110XA Fall on same level from slipping, tripping and stumbling with subsequent striking against unspecified object, initial encounter: Secondary | ICD-10-CM | POA: Insufficient documentation

## 2017-06-21 DIAGNOSIS — I5020 Unspecified systolic (congestive) heart failure: Secondary | ICD-10-CM | POA: Insufficient documentation

## 2017-06-21 MED ORDER — ACETAMINOPHEN 160 MG/5ML PO SOLN
650.0000 mg | Freq: Once | ORAL | Status: AC
  Administered 2017-06-21: 650 mg via ORAL
  Filled 2017-06-21: qty 20.3

## 2017-06-21 MED ORDER — TETANUS-DIPHTH-ACELL PERTUSSIS 5-2.5-18.5 LF-MCG/0.5 IM SUSP
0.5000 mL | Freq: Once | INTRAMUSCULAR | Status: AC
  Administered 2017-06-21: 0.5 mL via INTRAMUSCULAR
  Filled 2017-06-21: qty 0.5

## 2017-06-21 MED ORDER — ACETAMINOPHEN 325 MG PO TABS: 650.0000 mg | ORAL_TABLET | Freq: Once | ORAL | Status: DC

## 2017-06-21 NOTE — ED Provider Notes (Signed)
[facility] Provider Note   CSN: [id number] Arrival date & time: 06/21/17  1842     History   Chief Complaint Chief Complaint  Patient presents with  . Fall    HPI Mindy Mills is a 66 y.o. female.Patient tripped and fell this [place]. She complains of pain at her chin and right jaw also neck pain and headache since event. She complains of jaw pain with swallowing. She did sustain a laceration to her chin and abrasion to her right knee as a result of the fall. She did not lose consciousness also complains of right-sided parietal headache since the fall. No loss of consciousness. No other injuries. Nothing makes symptoms better or worse. No treatment prior to coming here. She was feeling well prior to the fall today. She was not using her walker today as result of the fall  HPI  Past Medical History:  Diagnosis Date  . 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 Hepatitis C   . Hypertension   . Migraine   . Stroke [facility])     Patient Active Problem List   Diagnosis Date Noted  . Fall 09/08/2015  . UTI (lower urinary tract infection) 09/08/2015  . Dehydration 09/08/2015  . Sepsis ([place]) 09/08/2015  . Systolic heart failure ([place]) 09/08/2015  . Confusion 09/08/2015  . TIA (transient ischemic attack) 09/08/2015  . Acute on chronic respiratory failure ([place]) 09/24/2014  . Tracheostomy status ([place]) 09/22/2014  . Demand ischemia of myocardium: in setting of CVA 09/17/2014  . 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 Acute respiratory failure ([place]) 09/11/2014  . Hypokalemia 09/11/2014  . Altered mental status 09/11/2014  . Subarachnoid hemorrhage ([place]) 09/11/2014    Past Surgical History:  Procedure Laterality Date  . ABDOMINAL HYSTERECTOMY    . PEG PLACEMENT N/A 09/21/2014   Procedure: PERCUTANEOUS ENDOSCOPIC  GASTROSTOMY (PEG) PLACEMENT (BED SIDE);  Surgeon: Georganna Skeans, MD;  Location: [place];  Service: General;  Laterality: N/A;  . RADIOLOGY WITH ANESTHESIA N/A 09/11/2014   Procedure: Sub Arachnoid Aneurysm Coiling -RADIOLOGY WITH ANESTHESIA ;  Surgeon: Consuella Lose, MD;  Location: [place];  Service: Radiology;  Laterality: N/A;    OB History    No data available       Home Medications    Prior to Admission medications   Medication Sig Start Date End Date Taking? Authorizing Provider  aspirin 325 MG EC tablet Take 325 mg by mouth daily.    [provider]  atorvastatin (LIPITOR) 20 MG tablet Take 20 mg by mouth daily at 6 PM.    [provider]  cephALEXin (KEFLEX) 500 MG capsule Take 1 capsule (500 mg total) by mouth 3 (three) times daily. For 3 more days from 9/14 and then stop 09/11/15   Ghimire, Henreitta Leber, MD  clonazePAM (KLONOPIN) 0.5 MG tablet Take 1 tablet (0.5 mg total) by mouth 2 (two) times daily. 09/11/15   Ghimire, Henreitta Leber, MD  EPINEPHrine (EPIPEN) 0.3 mg/0.3 mL IJ SOAJ injection Inject 0.3 mg into the muscle once as needed (for allergic reaction).    [provider]  feeding supplement, ENSURE ENLIVE, (ENSURE ENLIVE) LIQD Take 237 mLs by mouth daily. 09/11/15   Ghimire, Henreitta Leber, MD  levETIRAcetam (KEPPRA) 500 MG tablet Take 500 mg by mouth 3 (three) times daily.    [provider]  mirtazapine (REMERON) 15 MG tablet Take 15 mg by mouth at bedtime.    [provider]  pantoprazole (PROTONIX) 40 MG tablet Take 1 tablet (40 mg total) by mouth at bedtime. 09/11/15   Ghimire, Henreitta Leber, MD  predniSONE (DELTASONE) 5 MG tablet Take 5 mg by mouth daily with breakfast.    [provider]  topiramate (TOPAMAX) 25 MG tablet Take 50 mg by mouth at bedtime.    [provider]  traZODone (DESYREL) 50 MG tablet Take 50 mg by mouth at bedtime.     [provider]  venlafaxine XR (EFFEXOR-XR) 75 MG 24 hr capsule  Take 225 mg by mouth daily with breakfast.    [provider]    Family History Family History  Problem Relation Age of Onset  . Alcohol abuse Father     Social History Social History  Substance Use Topics  . Smoking status: Former Smoker    Packs/day: 0.50    Types: Cigarettes    Quit date: 09/06/2015  . Smokeless tobacco: Never Used  . Alcohol use Yes     Comment: rarely     Allergies   Bee venom   Review of Systems Review of Systems  HENT:       Jaw pain  Musculoskeletal: Positive for gait problem and neck pain.       Walks with walker  Skin: Positive for wound.       Abrasion to right knee laceration at chin  Neurological: Positive for headaches.     Physical Exam Updated Vital Signs BP (!) 161/100 (BP Location: Right Arm)   Pulse 76   Temp 99.1 F (37.3 C) (Oral)   Resp 18   Ht 5\' 3"  (1.6 m)   Wt 55.3 kg (122 lb)   SpO2 92%   BMI 21.61 kg/m   Physical Exam  Constitutional:  Frail-appearing  HENT:  Edentulous. 0.5 cm laceration at chin. No trismus. She is tender at right jaw. No crepitus at jaw. Otherwise normocephalic atraumatic  Eyes: Conjunctivae are normal. Pupils are equal, round, and reactive to light.  Neck: Neck supple. No tracheal deviation present. No thyromegaly present.  Mild midline tenderness posteriorly, diffusely.  Cardiovascular: Normal rate and regular rhythm.   No murmur heard. Pulmonary/Chest: Effort normal and breath sounds normal.  Abdominal: Soft. Bowel sounds are normal. She exhibits no distension. There is no tenderness.  Musculoskeletal: Normal range of motion. She exhibits no edema or tenderness.  Thoracic and lumbar spine nontender. Pelvis stable nontender. Right lower extremity with 3 cm abrasion over lying anterior knee. No swelling no deformity no bony tenderness otherwise atraumatic. All other extremities without contusion abrasion or tenderness neurovascularly intact  Neurological: She is alert. No cranial  nerve deficit. Coordination normal.  Gait normal motor strength 5 over 5 overall  Skin: Skin is warm and dry. No rash noted.  Psychiatric: She has a normal mood and affect.  Nursing note and vitals reviewed.    ED Treatments / Results  Labs (all labs ordered are listed, but only abnormal results are displayed) Labs Reviewed - No data to display  EKG  EKG Interpretation None      Results for orders placed or performed during the hospital encounter of 09/08/15  Urine culture  Result Value Ref Range   Specimen Description URINE, CLEAN CATCH    Special Requests NONE    Culture MULTIPLE SPECIES PRESENT, SUGGEST RECOLLECTION    Report Status 09/10/2015 FINAL  Blood culture (routine x 2)  Result Value Ref Range   Specimen Description BLOOD LEFT HAND    Special Requests BOTTLES DRAWN AEROBIC AND ANAEROBIC 4CC    Culture NO GROWTH 5 DAYS    Report Status 09/13/2015 FINAL   Blood culture (routine x 2)  Result Value Ref Range   Specimen Description BLOOD LEFT ANTECUBITAL    Special Requests BOTTLES DRAWN AEROBIC ONLY 5CC    Culture NO GROWTH 5 DAYS    Report Status 09/14/2015 FINAL   Protime-INR  Result Value Ref Range   Prothrombin Time 13.4 11.6 - 15.2 seconds   INR 1.00 0.00 - 1.49  APTT  Result Value Ref Range   aPTT 28 24 - 37 seconds  CBC  Result Value Ref Range   WBC 20.9 (H) 4.0 - 10.5 K/uL   RBC 5.09 3.87 - 5.11 MIL/uL   Hemoglobin 15.1 (H) 12.0 - 15.0 g/dL   HCT 45.1 36.0 - 46.0 %   MCV 88.6 78.0 - 100.0 fL   MCH 29.7 26.0 - 34.0 pg   MCHC 33.5 30.0 - 36.0 g/dL   RDW 13.2 11.5 - 15.5 %   Platelets 222 150 - 400 K/uL  Differential  Result Value Ref Range   Neutrophils Relative % 86 (H) 43 - 77 %   Lymphocytes Relative 8 (L) 12 - 46 %   Monocytes Relative 6 3 - 12 %   Eosinophils Relative 0 0 - 5 %   Basophils Relative 0 0 - 1 %   Neutro Abs 17.9 (H) 1.7 - 7.7 K/uL   Lymphs Abs 1.7 0.7 - 4.0 K/uL   Monocytes Absolute 1.3 (H) 0.1 - 1.0 K/uL   Eosinophils  Absolute 0.0 0.0 - 0.7 K/uL   Basophils Absolute 0.0 0.0 - 0.1 K/uL   WBC Morphology ATYPICAL LYMPHOCYTES   Comprehensive metabolic panel  Result Value Ref Range   Sodium 140 135 - 145 mmol/L   Potassium 4.0 3.5 - 5.1 mmol/L   Chloride 108 101 - 111 mmol/L   CO2 21 (L) 22 - 32 mmol/L   Glucose, Bld 106 (H) 65 - 99 mg/dL   BUN 16 6 - 20 mg/dL   Creatinine, Ser 1.01 (H) 0.44 - 1.00 mg/dL   Calcium 8.9 8.9 - 10.3 mg/dL   Total Protein 6.9 6.5 - 8.1 g/dL   Albumin 3.7 3.5 - 5.0 g/dL   AST 21 15 - 41 U/L   ALT 21 14 - 54 U/L   Alkaline Phosphatase 86 38 - 126 U/L   Total Bilirubin 0.6 0.3 - 1.2 mg/dL   GFR calc non Af Amer 58 (L) >60 mL/min   GFR calc Af Amer >60 >60 mL/min   Anion gap 11 5 - 15  Urinalysis, Routine w reflex microscopic  Result Value Ref Range   Color, Urine YELLOW YELLOW   APPearance CLEAR CLEAR   Specific Gravity, Urine 1.038 (H) 1.005 - 1.030   pH 6.5 5.0 - 8.0   Glucose, UA NEGATIVE NEGATIVE mg/dL   Hgb urine dipstick LARGE (A) NEGATIVE   Bilirubin Urine NEGATIVE NEGATIVE   Ketones, ur NEGATIVE NEGATIVE mg/dL   Protein, ur 30 (A) NEGATIVE mg/dL   Urobilinogen, UA 1.0 0.0 - 1.0 mg/dL   Nitrite NEGATIVE NEGATIVE   Leukocytes, UA SMALL (A) NEGATIVE  Urine microscopic-add on  Result Value Ref Range   WBC, UA 21-50 <3 WBC/hpf   RBC / HPF 21-50 <3 RBC/hpf  Hemoglobin A1c  Result Value  Ref Range   Hgb A1c MFr Bld 5.7 (H) 4.8 - 5.6 %   Mean Plasma Glucose 117 mg/dL  Lipid panel  Result Value Ref Range   Cholesterol 159 0 - 200 mg/dL   Triglycerides 90 <150 mg/dL   HDL 52 >40 mg/dL   Total CHOL/HDL Ratio 3.1 RATIO   VLDL 18 0 - 40 mg/dL   LDL Cholesterol 89 0 - 99 mg/dL  CBC  Result Value Ref Range   WBC 14.7 (H) 4.0 - 10.5 K/uL   RBC 4.51 3.87 - 5.11 MIL/uL   Hemoglobin 12.8 12.0 - 15.0 g/dL   HCT 40.4 36.0 - 46.0 %   MCV 89.6 78.0 - 100.0 fL   MCH 28.4 26.0 - 34.0 pg   MCHC 31.7 30.0 - 36.0 g/dL   RDW 13.2 11.5 - 15.5 %   Platelets 203 150 - 400  K/uL  Basic metabolic panel  Result Value Ref Range   Sodium 136 135 - 145 mmol/L   Potassium 3.5 3.5 - 5.1 mmol/L   Chloride 103 101 - 111 mmol/L   CO2 20 (L) 22 - 32 mmol/L   Glucose, Bld 215 (H) 65 - 99 mg/dL   BUN 26 (H) 6 - 20 mg/dL   Creatinine, Ser 1.19 (H) 0.44 - 1.00 mg/dL   Calcium 8.2 (L) 8.9 - 10.3 mg/dL   GFR calc non Af Amer 48 (L) >60 mL/min   GFR calc Af Amer 55 (L) >60 mL/min   Anion gap 13 5 - 15  I-stat troponin, ED (not at [facility], [facility])  Result Value Ref Range   Troponin i, poc 0.00 0.00 - 0.08 ng/mL   Comment 3          CBG monitoring, ED  Result Value Ref Range   Glucose-Capillary 115 (H) 65 - 99 mg/dL   Comment 1 Notify RN   I-Stat Chem 8, ED  (not at [facility], [facility]  Result Value Ref Range   Sodium 143 135 - 145 mmol/L   Potassium 4.1 3.5 - 5.1 mmol/L   Chloride 108 101 - 111 mmol/L   BUN 19 6 - 20 mg/dL   Creatinine, Ser 1.00 0.44 - 1.00 mg/dL   Glucose, Bld 107 (H) 65 - 99 mg/dL   Calcium, Ion 1.15 1.13 - 1.30 mmol/L   TCO2 21 0 - 100 mmol/L   Hemoglobin 16.0 (H) 12.0 - 15.0 g/dL   HCT 47.0 (H) 36.0 - 46.0 %  I-Stat CG4 Lactic Acid, ED  Result Value Ref Range   Lactic Acid, Venous 1.29 0.5 - 2.0 mmol/L   Ct Head Wo Contrast  Result Date: 06/21/2017 CLINICAL DATA:  66 year old female with head, neck and face injury following fall. Headache, neck pain and facial pain. History of aneurysm coiling. EXAM: CT HEAD WITHOUT CONTRAST CT MAXILLOFACIAL WITHOUT CONTRAST CT CERVICAL SPINE WITHOUT CONTRAST TECHNIQUE: Multidetector CT imaging of the head, cervical spine, and maxillofacial structures were performed using the standard protocol without intravenous contrast. Multiplanar CT image reconstructions of the cervical spine and maxillofacial structures were also generated. COMPARISON:  09/08/2015 head CT and MR.  Prior studies. FINDINGS: CT HEAD FINDINGS Brain: No evidence of acute infarction, hemorrhage, hydrocephalus, extra-axial collection or mass lesion/mass  effect. Chronic small-vessel white matter ischemic changes are again noted. Vascular: Anterior aneurysm clip again noted. Intracranial atherosclerotic calcifications identified. Skull: Normal. Negative for fracture or focal lesion. Other: None. CT MAXILLOFACIAL FINDINGS Osseous: Bilateral displaced/angulated mandibular fractures at the bases of the condylar processes  noted. A nondisplaced fracture at the base of the right mandibular coronoid process is also identified. No other fracture, subluxation or dislocation identified. Orbits: Negative. No traumatic or inflammatory finding. Sinuses: Clear. Soft tissues: Soft tissue swelling overlying the mandibular fractures noted. CT CERVICAL SPINE FINDINGS Alignment: Normal. Skull base and vertebrae: No acute fracture. No primary bone lesion or focal pathologic process. Soft tissues and spinal canal: No prevertebral fluid or swelling. No visible canal hematoma. Disc levels: Mild multilevel degenerative disc disease and spondylosis noted, greatest at C5-6. Upper chest: Negative. Other: None IMPRESSION: Bilateral displaced/angulated mandibular fractures, at the bases of the condylar processes. Nondisplaced fracture at the base of the right mandibular coronoid process. No evidence of acute intracranial abnormality. Chronic small-vessel white matter ischemic changes and aneurysm clip. No static evidence of acute injury to the cervical spine. Mild degenerative changes as described. Electronically Signed   By: Margarette Canada M.D.   On: 06/21/2017 19:54   Ct Cervical Spine Wo Contrast  Result Date: 06/21/2017 CLINICAL DATA:  67 year old female with head, neck and face injury following fall. Headache, neck pain and facial pain. History of aneurysm coiling. EXAM: CT HEAD WITHOUT CONTRAST CT MAXILLOFACIAL WITHOUT CONTRAST CT CERVICAL SPINE WITHOUT CONTRAST TECHNIQUE: Multidetector CT imaging of the head, cervical spine, and maxillofacial structures were performed using the standard  protocol without intravenous contrast. Multiplanar CT image reconstructions of the cervical spine and maxillofacial structures were also generated. COMPARISON:  09/08/2015 head CT and MR.  Prior studies. FINDINGS: CT HEAD FINDINGS Brain: No evidence of acute infarction, hemorrhage, hydrocephalus, extra-axial collection or mass lesion/mass effect. Chronic small-vessel white matter ischemic changes are again noted. Vascular: Anterior aneurysm clip again noted. Intracranial atherosclerotic calcifications identified. Skull: Normal. Negative for fracture or focal lesion. Other: None. CT MAXILLOFACIAL FINDINGS Osseous: Bilateral displaced/angulated mandibular fractures at the bases of the condylar processes noted. A nondisplaced fracture at the base of the right mandibular coronoid process is also identified. No other fracture, subluxation or dislocation identified. Orbits: Negative. No traumatic or inflammatory finding. Sinuses: Clear. Soft tissues: Soft tissue swelling overlying the mandibular fractures noted. CT CERVICAL SPINE FINDINGS Alignment: Normal. Skull base and vertebrae: No acute fracture. No primary bone lesion or focal pathologic process. Soft tissues and spinal canal: No prevertebral fluid or swelling. No visible canal hematoma. Disc levels: Mild multilevel degenerative disc disease and spondylosis noted, greatest at C5-6. Upper chest: Negative. Other: None IMPRESSION: Bilateral displaced/angulated mandibular fractures, at the bases of the condylar processes. Nondisplaced fracture at the base of the right mandibular coronoid process. No evidence of acute intracranial abnormality. Chronic small-vessel white matter ischemic changes and aneurysm clip. No static evidence of acute injury to the cervical spine. Mild degenerative changes as described. Electronically Signed   By: Margarette Canada M.D.   On: 06/21/2017 19:54   Ct Maxillofacial Wo Contrast  Result Date: 06/21/2017 CLINICAL DATA:  66 year old female  with head, neck and face injury following fall. Headache, neck pain and facial pain. History of aneurysm coiling. EXAM: CT HEAD WITHOUT CONTRAST CT MAXILLOFACIAL WITHOUT CONTRAST CT CERVICAL SPINE WITHOUT CONTRAST TECHNIQUE: Multidetector CT imaging of the head, cervical spine, and maxillofacial structures were performed using the standard protocol without intravenous contrast. Multiplanar CT image reconstructions of the cervical spine and maxillofacial structures were also generated. COMPARISON:  09/08/2015 head CT and MR.  Prior studies. FINDINGS: CT HEAD FINDINGS Brain: No evidence of acute infarction, hemorrhage, hydrocephalus, extra-axial collection or mass lesion/mass effect. Chronic small-vessel white matter ischemic changes are again noted.  Vascular: Anterior aneurysm clip again noted. Intracranial atherosclerotic calcifications identified. Skull: Normal. Negative for fracture or focal lesion. Other: None. CT MAXILLOFACIAL FINDINGS Osseous: Bilateral displaced/angulated mandibular fractures at the bases of the condylar processes noted. A nondisplaced fracture at the base of the right mandibular coronoid process is also identified. No other fracture, subluxation or dislocation identified. Orbits: Negative. No traumatic or inflammatory finding. Sinuses: Clear. Soft tissues: Soft tissue swelling overlying the mandibular fractures noted. CT CERVICAL SPINE FINDINGS Alignment: Normal. Skull base and vertebrae: No acute fracture. No primary bone lesion or focal pathologic process. Soft tissues and spinal canal: No prevertebral fluid or swelling. No visible canal hematoma. Disc levels: Mild multilevel degenerative disc disease and spondylosis noted, greatest at C5-6. Upper chest: Negative. Other: None IMPRESSION: Bilateral displaced/angulated mandibular fractures, at the bases of the condylar processes. Nondisplaced fracture at the base of the right mandibular coronoid process. No evidence of acute intracranial  abnormality. Chronic small-vessel white matter ischemic changes and aneurysm clip. No static evidence of acute injury to the cervical spine. Mild degenerative changes as described. Electronically Signed   By: Margarette Canada M.D.   On: 06/21/2017 19:54   Radiology No results found.  Procedures Procedures (including critical care time)  Medications Ordered in ED Medications  acetaminophen (TYLENOL) tablet 650 mg (not administered)  Tdap (BOOSTRIX) injection 0.5 mL (not administered)     Initial Impression / Assessment and Plan / ED Course  I have reviewed the triage vital signs and the nursing notes.  Pertinent labs & imaging results that were available during my care of the patient were reviewed by me and considered in my medical decision making (see chart for details).     10:40 PM pain much improved after treatment with Tylenol. I discussed case with Dr. Wilburn Cornelia who reviewed CT scans. Plan Tylenol for pain. Mandible fracture did not require operative intervention. Soft diet. Local wound care for laceration on chin and abrasion knee. Follow-up at [facility] office in one week. Blood pressure recheck Final Clinical Impressions(s) / ED Diagnoses  Diagnosis #1 fall #2 closed mandible fracture #3 abrasions to right knee #4 elevated blood pressure Final diagnoses:  None    New Prescriptions New Prescriptions   No medications on file     Orlie Dakin, MD 06/21/17 2251

## 2017-06-21 NOTE — ED Notes (Signed)
[facility] notified [facility]) - for consult for oral surgeon

## 2017-06-21 NOTE — ED Triage Notes (Signed)
Pt reports she tripped/fell on to concrete approx 230pm-lac to chin, abrasions to knees-no LOC-c/o HA, "hurts to breathe" and difficult swallowing-NAD-pt is handling own saliva

## 2017-06-21 NOTE — Discharge Instructions (Signed)
Take Tylenol every 4 hours as needed for pain. It is soft diet as a scrambled eggs, Jell-O, applesauce. Call Dr. Victorio Palm office tomorrow to schedule an appointment for within one week to get rechecked. Your blood pressure should be rechecked in a week. You can call any of the numbers on these instructions to get a primary care physician to check your blood pressure or go to an urgent care center. Today's blood pressure was elevated at 182/98. Wash the wounds on your chin and knee every day with soap and water and place a thin layer of bacitracin ointment over the wounds and cover with a sterile bandage. Signs of infection include redness around the wounds, drainage from the wounds or fever. Return or see your doctor if you think he might be developing an infection. Use your walker at all times to prevent falls.

## 2018-02-09 ENCOUNTER — Other Ambulatory Visit: Payer: Self-pay

## 2018-02-09 ENCOUNTER — Emergency Department (INDEPENDENT_AMBULATORY_CARE_PROVIDER_SITE_OTHER): Payer: Medicare Other

## 2018-02-09 ENCOUNTER — Emergency Department (INDEPENDENT_AMBULATORY_CARE_PROVIDER_SITE_OTHER)
Admission: EM | Admit: 2018-02-09 | Discharge: 2018-02-09 | Disposition: A | Discharge status: Home / Self Care | Payer: Medicare Other | Source: Home / Self Care | Attending: Emergency Medicine | Admitting: Emergency Medicine

## 2018-02-09 DIAGNOSIS — S0083XA Contusion of other part of head, initial encounter: Secondary | ICD-10-CM | POA: Diagnosis not present

## 2018-02-09 DIAGNOSIS — S8001XA Contusion of right knee, initial encounter: Secondary | ICD-10-CM | POA: Diagnosis not present

## 2018-02-09 DIAGNOSIS — S0992XA Unspecified injury of nose, initial encounter: Secondary | ICD-10-CM

## 2018-02-09 DIAGNOSIS — M25461 Effusion, right knee: Secondary | ICD-10-CM

## 2018-02-09 DIAGNOSIS — S0181XA Laceration without foreign body of other part of head, initial encounter: Secondary | ICD-10-CM | POA: Diagnosis not present

## 2018-02-09 DIAGNOSIS — M1711 Unilateral primary osteoarthritis, right knee: Secondary | ICD-10-CM | POA: Diagnosis not present

## 2018-02-09 DIAGNOSIS — W19XXXA Unspecified fall, initial encounter: Secondary | ICD-10-CM | POA: Diagnosis not present

## 2018-02-09 LAB — POCT FASTING CBG KUC MANUAL ENTRY: POCT Glucose (KUC): 98 mg/dL (ref 70–99)

## 2018-02-09 NOTE — Discharge Instructions (Signed)
facial x-rays, negative for fracture X-ray right knee, no fracture or dislocation. Today, the puncture wound/laceration just need nose, repaired with glue. Please see attached instruction sheets. Tylenol as needed for mild-to-moderate pain. May use tramadol that you have at home if needed for moderate to severe pain, but your son needs to administer and make sure it doesn't cause drowsiness. Be sure to follow-up with your PCP and neurologist within the next 7 days, but go to emergency room if any severe symptoms

## 2018-02-09 NOTE — ED Triage Notes (Signed)
Pt was leaving the store earlier this afternoon, and fell.  She has an abrasion to her nose and upper lip, as well as a spot in her left bicep.  She is complaining of right knee pain. And scraped her hands.
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
## 2018-08-31 ENCOUNTER — Emergency Department (HOSPITAL_COMMUNITY): Payer: Medicare Other

## 2018-08-31 ENCOUNTER — Emergency Department (HOSPITAL_COMMUNITY)
Admission: EM | Admit: 2018-08-31 | Discharge: 2018-08-31 | Disposition: A | Discharge status: Home / Self Care | Payer: Medicare Other | Attending: Emergency Medicine | Admitting: Emergency Medicine

## 2018-08-31 ENCOUNTER — Encounter (HOSPITAL_COMMUNITY): Payer: Self-pay

## 2018-08-31 ENCOUNTER — Other Ambulatory Visit: Payer: Self-pay

## 2018-08-31 DIAGNOSIS — I5020 Unspecified systolic (congestive) heart failure: Secondary | ICD-10-CM | POA: Diagnosis not present

## 2018-08-31 DIAGNOSIS — Z87891 Personal history of nicotine dependence: Secondary | ICD-10-CM | POA: Diagnosis not present

## 2018-08-31 DIAGNOSIS — Z79899 Other long term (current) drug therapy: Secondary | ICD-10-CM | POA: Diagnosis not present

## 2018-08-31 DIAGNOSIS — Z8541 Personal history of malignant neoplasm of cervix uteri: Secondary | ICD-10-CM | POA: Insufficient documentation

## 2018-08-31 DIAGNOSIS — Z7982 Long term (current) use of aspirin: Secondary | ICD-10-CM | POA: Insufficient documentation

## 2018-08-31 DIAGNOSIS — Z8673 Personal history of transient ischemic attack (TIA), and cerebral infarction without residual deficits: Secondary | ICD-10-CM | POA: Insufficient documentation

## 2018-08-31 DIAGNOSIS — S60512A Abrasion of left hand, initial encounter: Secondary | ICD-10-CM | POA: Diagnosis not present

## 2018-08-31 DIAGNOSIS — S59911A Unspecified injury of right forearm, initial encounter: Secondary | ICD-10-CM | POA: Diagnosis present

## 2018-08-31 DIAGNOSIS — S52501A Unspecified fracture of the lower end of right radius, initial encounter for closed fracture: Secondary | ICD-10-CM | POA: Diagnosis not present

## 2018-08-31 DIAGNOSIS — I110 Hypertensive heart disease with heart failure: Secondary | ICD-10-CM | POA: Diagnosis not present

## 2018-08-31 DIAGNOSIS — Y999 Unspecified external cause status: Secondary | ICD-10-CM | POA: Diagnosis not present

## 2018-08-31 DIAGNOSIS — Y939 Activity, unspecified: Secondary | ICD-10-CM | POA: Diagnosis not present

## 2018-08-31 DIAGNOSIS — Y92410 Unspecified street and highway as the place of occurrence of the external cause: Secondary | ICD-10-CM | POA: Diagnosis not present

## 2018-08-31 DIAGNOSIS — S20319A Abrasion of unspecified front wall of thorax, initial encounter: Secondary | ICD-10-CM | POA: Diagnosis not present

## 2018-08-31 DIAGNOSIS — T07XXXA Unspecified multiple injuries, initial encounter: Secondary | ICD-10-CM

## 2018-08-31 LAB — CBC WITH DIFFERENTIAL/PLATELET
ABS IMMATURE GRANULOCYTES: 0 10*3/uL (ref 0.0–0.1)
Basophils Absolute: 0.1 10*3/uL (ref 0.0–0.1)
Basophils Relative: 1 %
Eosinophils Absolute: 0.1 10*3/uL (ref 0.0–0.7)
Eosinophils Relative: 1 %
HEMATOCRIT: 41.1 % (ref 36.0–46.0)
Hemoglobin: 12.7 g/dL (ref 12.0–15.0)
IMMATURE GRANULOCYTES: 0 %
LYMPHS ABS: 2.1 10*3/uL (ref 0.7–4.0)
Lymphocytes Relative: 20 %
MCH: 27.9 pg (ref 26.0–34.0)
MCHC: 30.9 g/dL (ref 30.0–36.0)
MCV: 90.1 fL (ref 78.0–100.0)
MONO ABS: 0.7 10*3/uL (ref 0.1–1.0)
MONOS PCT: 7 %
NEUTROS ABS: 7.4 10*3/uL (ref 1.7–7.7)
NEUTROS PCT: 71 %
Platelets: 253 10*3/uL (ref 150–400)
RBC: 4.56 MIL/uL (ref 3.87–5.11)
RDW: 14.6 % (ref 11.5–15.5)
WBC: 10.4 10*3/uL (ref 4.0–10.5)

## 2018-08-31 LAB — COMPREHENSIVE METABOLIC PANEL
ALBUMIN: 3.2 g/dL — AB (ref 3.5–5.0)
ALK PHOS: 78 U/L (ref 38–126)
ALT: 64 U/L — ABNORMAL HIGH (ref 0–44)
AST: 70 U/L — AB (ref 15–41)
Anion gap: 6 (ref 5–15)
BUN: 14 mg/dL (ref 8–23)
CALCIUM: 9.1 mg/dL (ref 8.9–10.3)
CO2: 27 mmol/L (ref 22–32)
Chloride: 109 mmol/L (ref 98–111)
Creatinine, Ser: 1.19 mg/dL — ABNORMAL HIGH (ref 0.44–1.00)
GFR calc Af Amer: 54 mL/min — ABNORMAL LOW (ref >60)
GFR calc non Af Amer: 47 mL/min — ABNORMAL LOW (ref >60)
GLUCOSE: 102 mg/dL — AB (ref 70–99)
Potassium: 4 mmol/L (ref 3.5–5.1)
SODIUM: 142 mmol/L (ref 135–145)
Total Bilirubin: 0.4 mg/dL (ref 0.3–1.2)
Total Protein: 7.2 g/dL (ref 6.5–8.1)

## 2018-08-31 LAB — ETHANOL: Alcohol, Ethyl (B): <10 mg/dL (ref <10)

## 2018-08-31 MED ORDER — SODIUM CHLORIDE 0.9 % IV SOLN
Freq: Once | INTRAVENOUS | Status: AC
  Administered 2018-08-31: 17:00:00 via INTRAVENOUS

## 2018-08-31 MED ORDER — MORPHINE SULFATE (PF) 4 MG/ML IV SOLN
4.0000 mg | Freq: Once | INTRAVENOUS | Status: AC
  Administered 2018-08-31: 4 mg via INTRAVENOUS
  Filled 2018-08-31: qty 1

## 2018-08-31 MED ORDER — OXYCODONE-ACETAMINOPHEN 5-325 MG PO TABS
2.0000 | ORAL_TABLET | Freq: Once | ORAL | Status: AC
  Administered 2018-08-31: 2 via ORAL
  Filled 2018-08-31: qty 2

## 2018-08-31 MED ORDER — LIDOCAINE HCL (PF) 1 % IJ SOLN
10.0000 mL | Freq: Once | INTRAMUSCULAR | Status: AC
  Administered 2018-08-31: 10 mL
  Filled 2018-08-31: qty 10

## 2018-08-31 MED ORDER — OXYCODONE-ACETAMINOPHEN 5-325 MG PO TABS: 1.0000 | ORAL_TABLET | Freq: Four times a day (QID) | ORAL | 0 refills | Status: DC | PRN

## 2018-08-31 NOTE — ED Notes (Signed)
Rt wrist

## 2018-08-31 NOTE — ED Notes (Signed)
Ortho paged at this time 

## 2018-08-31 NOTE — ED Notes (Signed)
Pt logrolled pt undressed gown placed  Alert oriented skin warm and dry  C/o pain rt wrist deformity  Pulse present ice pack placed

## 2018-08-31 NOTE — ED Notes (Signed)
Pain med given 
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
## 2018-08-31 NOTE — ED Notes (Signed)
To x-ray

## 2018-08-31 NOTE — Progress Notes (Signed)
Orthopedic Tech Progress Note Patient Details:  KEAYRA GRAHAM Jan 14, 1951 [id number]  Ortho Devices Type of Ortho Device: Arm sling, Sugartong splint Ortho Device/Splint Location: rue.  Ortho Device/Splint Interventions: Ordered, Application, Adjustment   Post Interventions Patient Tolerated: Well Instructions Provided: Care of device, Adjustment of device   Karolee Stamps 08/31/2018, 9:24 PM

## 2018-08-31 NOTE — ED Notes (Signed)
Pt has purewick in place and is hooked up to suction canister.
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
## 2018-08-31 NOTE — ED Notes (Signed)
Patient transported to X-ray for Right hand

## 2018-08-31 NOTE — ED Notes (Signed)
Patient transported to X-ray 

## 2018-08-31 NOTE — Discharge Instructions (Addendum)
1.  Elevate your wrist as much as possible over the next several days.  Apply well wrapped ice pack. 2.  You may develop other areas of pain over the next several days.  If you develop, numbness, tingling, severe headache, chest pain, shortness of breath, abdominal pain or other concerning symptoms return to the emergency department. 3.  Call to schedule a follow-up with orthopedics.  You may follow-up with Dr. Apolonio Schneiders or a physician at the [facility] if you choose.

## 2018-09-06 ENCOUNTER — Encounter (HOSPITAL_COMMUNITY): Payer: Self-pay | Admitting: *Deleted

## 2018-09-06 ENCOUNTER — Other Ambulatory Visit: Payer: Self-pay

## 2018-09-06 NOTE — Progress Notes (Signed)
Pt denies SOB, chest pain, and being under the care of a cardiologist. Pt denies having a stress test and cardiac cath. Pt denies having an EKG within the last year. Pt made aware to stop taking vitamins, fish oil and herbal medications. Do not take any NSAIDs ie: Ibuprofen, Advil, Naproxen Aleve, Motrin, BC and Goody Powder. Pt verbalized understanding of all pre-op instruction. Anesthesia made aware of elevated liver enzymes.

## 2018-09-07 ENCOUNTER — Other Ambulatory Visit: Payer: Self-pay

## 2018-09-07 ENCOUNTER — Encounter (HOSPITAL_COMMUNITY): Payer: Self-pay | Admitting: Surgery

## 2018-09-07 ENCOUNTER — Ambulatory Visit (HOSPITAL_COMMUNITY): Payer: Medicare Other | Admitting: Anesthesiology

## 2018-09-07 ENCOUNTER — Encounter (HOSPITAL_COMMUNITY)
Admission: RE | Disposition: A | Discharge status: Skilled Nursing Facility | Payer: Self-pay | Source: Ambulatory Visit | Attending: Orthopedic Surgery

## 2018-09-07 ENCOUNTER — Observation Stay (HOSPITAL_COMMUNITY)
Admission: RE | Admit: 2018-09-07 | Discharge: 2018-09-09 | Disposition: A | Discharge status: Skilled Nursing Facility | Payer: Medicare Other | Source: Ambulatory Visit | Attending: Orthopedic Surgery | Admitting: Orthopedic Surgery

## 2018-09-07 DIAGNOSIS — I252 Old myocardial infarction: Secondary | ICD-10-CM | POA: Diagnosis not present

## 2018-09-07 DIAGNOSIS — Z87891 Personal history of nicotine dependence: Secondary | ICD-10-CM | POA: Insufficient documentation

## 2018-09-07 DIAGNOSIS — Z8673 Personal history of transient ischemic attack (TIA), and cerebral infarction without residual deficits: Secondary | ICD-10-CM | POA: Diagnosis not present

## 2018-09-07 DIAGNOSIS — F419 Anxiety disorder, unspecified: Secondary | ICD-10-CM | POA: Diagnosis not present

## 2018-09-07 DIAGNOSIS — X58XXXA Exposure to other specified factors, initial encounter: Secondary | ICD-10-CM | POA: Diagnosis not present

## 2018-09-07 DIAGNOSIS — I10 Essential (primary) hypertension: Secondary | ICD-10-CM | POA: Diagnosis not present

## 2018-09-07 DIAGNOSIS — Z7982 Long term (current) use of aspirin: Secondary | ICD-10-CM | POA: Diagnosis not present

## 2018-09-07 DIAGNOSIS — Z79899 Other long term (current) drug therapy: Secondary | ICD-10-CM | POA: Diagnosis not present

## 2018-09-07 DIAGNOSIS — B1920 Unspecified viral hepatitis C without hepatic coma: Secondary | ICD-10-CM | POA: Diagnosis not present

## 2018-09-07 DIAGNOSIS — S52501A Unspecified fracture of the lower end of right radius, initial encounter for closed fracture: Secondary | ICD-10-CM | POA: Diagnosis present

## 2018-09-07 DIAGNOSIS — S52571A Other intraarticular fracture of lower end of right radius, initial encounter for closed fracture: Secondary | ICD-10-CM | POA: Diagnosis present

## 2018-09-07 HISTORY — PX: OPEN REDUCTION INTERNAL FIXATION (ORIF) DISTAL RADIAL FRACTURE: SHX5989

## 2018-09-07 HISTORY — DX: Presence of external hearing-aid: Z97.4

## 2018-09-07 HISTORY — DX: Presence of dental prosthetic device (complete) (partial): Z97.2

## 2018-09-07 HISTORY — DX: Presence of spectacles and contact lenses: Z97.3

## 2018-09-07 HISTORY — DX: Acute myocardial infarction, unspecified: I21.9

## 2018-09-07 HISTORY — DX: Complete loss of teeth, unspecified cause, unspecified class: K08.109

## 2018-09-07 SURGERY — OPEN REDUCTION INTERNAL FIXATION (ORIF) DISTAL RADIUS FRACTURE
Anesthesia: Regional | Site: Wrist | Laterality: Right

## 2018-09-07 MED ORDER — ONDANSETRON HCL 4 MG PO TABS: 4.0000 mg | ORAL_TABLET | Freq: Four times a day (QID) | ORAL | Status: DC | PRN

## 2018-09-07 MED ORDER — CEFAZOLIN SODIUM-DEXTROSE 2-4 GM/100ML-% IV SOLN
INTRAVENOUS | Status: AC
  Filled 2018-09-07: qty 100

## 2018-09-07 MED ORDER — EPINEPHRINE 0.3 MG/0.3ML IJ SOAJ
0.3000 mg | Freq: Once | INTRAMUSCULAR | Status: DC | PRN
  Filled 2018-09-07: qty 0.3

## 2018-09-07 MED ORDER — TRAMADOL HCL 50 MG PO TABS
100.0000 mg | ORAL_TABLET | Freq: Once | ORAL | Status: AC
  Administered 2018-09-07: 100 mg via ORAL

## 2018-09-07 MED ORDER — PHENYLEPHRINE 40 MCG/ML (10ML) SYRINGE FOR IV PUSH (FOR BLOOD PRESSURE SUPPORT)
PREFILLED_SYRINGE | INTRAVENOUS | Status: DC | PRN
  Administered 2018-09-07 (×3): 80 ug via INTRAVENOUS

## 2018-09-07 MED ORDER — MIDAZOLAM HCL 2 MG/2ML IJ SOLN
INTRAMUSCULAR | Status: AC
  Filled 2018-09-07: qty 2

## 2018-09-07 MED ORDER — LIDOCAINE 2% (20 MG/ML) 5 ML SYRINGE
INTRAMUSCULAR | Status: DC | PRN
  Administered 2018-09-07: 40 mg via INTRAVENOUS

## 2018-09-07 MED ORDER — MIDAZOLAM HCL 5 MG/5ML IJ SOLN
INTRAMUSCULAR | Status: DC | PRN
  Administered 2018-09-07: 1 mg via INTRAVENOUS

## 2018-09-07 MED ORDER — ACETAMINOPHEN 650 MG RE SUPP: 650.0000 mg | Freq: Four times a day (QID) | RECTAL | Status: DC | PRN

## 2018-09-07 MED ORDER — TRAMADOL HCL 50 MG PO TABS
ORAL_TABLET | ORAL | Status: AC
  Administered 2018-09-07: 100 mg via ORAL
  Filled 2018-09-07: qty 2

## 2018-09-07 MED ORDER — DIPHENHYDRAMINE HCL 12.5 MG/5ML PO ELIX: 12.5000 mg | ORAL_SOLUTION | ORAL | Status: DC | PRN

## 2018-09-07 MED ORDER — LACTATED RINGERS IV SOLN
INTRAVENOUS | Status: DC | PRN
  Administered 2018-09-07: 15:00:00 via INTRAVENOUS

## 2018-09-07 MED ORDER — ASPIRIN EC 325 MG PO TBEC
325.0000 mg | DELAYED_RELEASE_TABLET | Freq: Every day | ORAL | Status: DC
  Administered 2018-09-07 – 2018-09-09 (×3): 325 mg via ORAL
  Filled 2018-09-07 (×2): qty 1

## 2018-09-07 MED ORDER — GLYCOPYRROLATE 0.2 MG/ML IJ SOLN
INTRAMUSCULAR | Status: DC | PRN
  Administered 2018-09-07: 0.1 mg via INTRAVENOUS

## 2018-09-07 MED ORDER — FENTANYL CITRATE (PF) 100 MCG/2ML IJ SOLN
INTRAMUSCULAR | Status: AC
  Filled 2018-09-07: qty 2

## 2018-09-07 MED ORDER — FLUOXETINE HCL 20 MG PO CAPS
80.0000 mg | ORAL_CAPSULE | Freq: Every day | ORAL | Status: DC
  Administered 2018-09-07 – 2018-09-09 (×3): 80 mg via ORAL
  Filled 2018-09-07 (×2): qty 4

## 2018-09-07 MED ORDER — CHLORHEXIDINE GLUCONATE 4 % EX LIQD: 60.0000 mL | Freq: Once | CUTANEOUS | Status: DC

## 2018-09-07 MED ORDER — DOCUSATE SODIUM 100 MG PO CAPS
100.0000 mg | ORAL_CAPSULE | Freq: Two times a day (BID) | ORAL | Status: DC
  Administered 2018-09-07 – 2018-09-09 (×5): 100 mg via ORAL
  Filled 2018-09-07 (×5): qty 1

## 2018-09-07 MED ORDER — PROPOFOL 10 MG/ML IV BOLUS
INTRAVENOUS | Status: DC | PRN
  Administered 2018-09-07: 30 mg via INTRAVENOUS
  Administered 2018-09-07: 20 mg via INTRAVENOUS
  Administered 2018-09-07: 100 mg via INTRAVENOUS

## 2018-09-07 MED ORDER — ACETAMINOPHEN 325 MG PO TABS: 650.0000 mg | ORAL_TABLET | Freq: Four times a day (QID) | ORAL | Status: DC | PRN

## 2018-09-07 MED ORDER — ONDANSETRON HCL 4 MG/2ML IJ SOLN
INTRAMUSCULAR | Status: DC | PRN
  Administered 2018-09-07: 4 mg via INTRAVENOUS

## 2018-09-07 MED ORDER — VITAMIN D 1000 UNITS PO TABS
1000.0000 [IU] | ORAL_TABLET | Freq: Every day | ORAL | Status: DC
  Administered 2018-09-07 – 2018-09-09 (×3): 1000 [IU] via ORAL
  Filled 2018-09-07 (×2): qty 1

## 2018-09-07 MED ORDER — METHOCARBAMOL 500 MG PO TABS: 500.0000 mg | ORAL_TABLET | Freq: Four times a day (QID) | ORAL | Status: DC | PRN

## 2018-09-07 MED ORDER — SENNOSIDES-DOCUSATE SODIUM 8.6-50 MG PO TABS: 1.0000 | ORAL_TABLET | Freq: Every evening | ORAL | Status: DC | PRN

## 2018-09-07 MED ORDER — ZOLPIDEM TARTRATE 5 MG PO TABS: 5.0000 mg | ORAL_TABLET | Freq: Every evening | ORAL | Status: DC | PRN

## 2018-09-07 MED ORDER — TRAZODONE HCL 100 MG PO TABS
200.0000 mg | ORAL_TABLET | Freq: Every day | ORAL | Status: DC
  Administered 2018-09-07 – 2018-09-09 (×3): 200 mg via ORAL
  Filled 2018-09-07 (×3): qty 2

## 2018-09-07 MED ORDER — FENTANYL CITRATE (PF) 100 MCG/2ML IJ SOLN
INTRAMUSCULAR | Status: DC | PRN
  Administered 2018-09-07: 25 ug via INTRAVENOUS
  Administered 2018-09-07: 50 ug via INTRAVENOUS

## 2018-09-07 MED ORDER — LIDOCAINE 2% (20 MG/ML) 5 ML SYRINGE
INTRAMUSCULAR | Status: AC
  Filled 2018-09-07: qty 5

## 2018-09-07 MED ORDER — PANTOPRAZOLE SODIUM 40 MG PO TBEC
40.0000 mg | DELAYED_RELEASE_TABLET | Freq: Every day | ORAL | Status: DC
  Administered 2018-09-07 – 2018-09-09 (×3): 40 mg via ORAL
  Filled 2018-09-07 (×3): qty 1

## 2018-09-07 MED ORDER — FENTANYL CITRATE (PF) 250 MCG/5ML IJ SOLN
INTRAMUSCULAR | Status: AC
  Filled 2018-09-07: qty 5

## 2018-09-07 MED ORDER — ONDANSETRON HCL 4 MG/2ML IJ SOLN
INTRAMUSCULAR | Status: AC
  Filled 2018-09-07: qty 2

## 2018-09-07 MED ORDER — MAGNESIUM CITRATE PO SOLN: 1.0000 | Freq: Once | ORAL | Status: DC | PRN

## 2018-09-07 MED ORDER — 0.9 % SODIUM CHLORIDE (POUR BTL) OPTIME
TOPICAL | Status: DC | PRN
  Administered 2018-09-07: 1000 mL

## 2018-09-07 MED ORDER — PROPOFOL 10 MG/ML IV BOLUS
INTRAVENOUS | Status: AC
  Filled 2018-09-07: qty 20

## 2018-09-07 MED ORDER — BISACODYL 5 MG PO TBEC: 5.0000 mg | DELAYED_RELEASE_TABLET | Freq: Every day | ORAL | Status: DC | PRN

## 2018-09-07 MED ORDER — METHOCARBAMOL 1000 MG/10ML IJ SOLN
500.0000 mg | Freq: Four times a day (QID) | INTRAVENOUS | Status: DC | PRN
  Filled 2018-09-07: qty 5

## 2018-09-07 MED ORDER — CEFAZOLIN SODIUM-DEXTROSE 2-4 GM/100ML-% IV SOLN
2.0000 g | INTRAVENOUS | Status: AC
  Administered 2018-09-07: 2 g via INTRAVENOUS

## 2018-09-07 MED ORDER — HYDROCODONE-ACETAMINOPHEN 5-325 MG PO TABS
1.0000 | ORAL_TABLET | ORAL | Status: DC | PRN
  Administered 2018-09-07 – 2018-09-09 (×5): 2 via ORAL
  Filled 2018-09-07 (×5): qty 2

## 2018-09-07 MED ORDER — BUPIVACAINE-EPINEPHRINE (PF) 0.5% -1:200000 IJ SOLN
INTRAMUSCULAR | Status: DC | PRN
  Administered 2018-09-07: 20 mL via PERINEURAL

## 2018-09-07 MED ORDER — PHENYLEPHRINE 40 MCG/ML (10ML) SYRINGE FOR IV PUSH (FOR BLOOD PRESSURE SUPPORT)
PREFILLED_SYRINGE | INTRAVENOUS | Status: AC
  Filled 2018-09-07: qty 10

## 2018-09-07 MED ORDER — DEXAMETHASONE SODIUM PHOSPHATE 4 MG/ML IJ SOLN
INTRAMUSCULAR | Status: DC | PRN
  Administered 2018-09-07: 5 mg via INTRAVENOUS

## 2018-09-07 MED ORDER — CLONAZEPAM 0.5 MG PO TABS
0.5000 mg | ORAL_TABLET | Freq: Two times a day (BID) | ORAL | Status: DC
  Administered 2018-09-07 – 2018-09-09 (×5): 0.5 mg via ORAL
  Filled 2018-09-07 (×5): qty 1

## 2018-09-07 MED ORDER — ROPIVACAINE HCL 5 MG/ML IJ SOLN
INTRAMUSCULAR | Status: DC | PRN
  Administered 2018-09-07: 5 mL via EPIDURAL

## 2018-09-07 MED ORDER — ACETAMINOPHEN 500 MG PO TABS
1000.0000 mg | ORAL_TABLET | Freq: Four times a day (QID) | ORAL | Status: DC
  Administered 2018-09-08 – 2018-09-09 (×3): 1000 mg via ORAL
  Filled 2018-09-07 (×5): qty 2

## 2018-09-07 MED ORDER — ENSURE ENLIVE PO LIQD
237.0000 mL | ORAL | Status: DC
  Administered 2018-09-07 – 2018-09-09 (×3): 237 mL via ORAL

## 2018-09-07 MED ORDER — ROSUVASTATIN CALCIUM 10 MG PO TABS
20.0000 mg | ORAL_TABLET | Freq: Every day | ORAL | Status: DC
  Administered 2018-09-07 – 2018-09-09 (×3): 20 mg via ORAL
  Filled 2018-09-07 (×2): qty 2

## 2018-09-07 MED ORDER — MORPHINE SULFATE (PF) 2 MG/ML IV SOLN: 1.0000 mg | INTRAVENOUS | Status: DC | PRN

## 2018-09-07 MED ORDER — PROPOFOL 500 MG/50ML IV EMUL
INTRAVENOUS | Status: DC | PRN
  Administered 2018-09-07: 60 ug/kg/min via INTRAVENOUS

## 2018-09-07 MED ORDER — ONDANSETRON HCL 4 MG/2ML IJ SOLN: 4.0000 mg | Freq: Four times a day (QID) | INTRAMUSCULAR | Status: DC | PRN

## 2018-09-07 SURGICAL SUPPLY — 57 items
BANDAGE ACE 3X5.8 VEL STRL LF (GAUZE/BANDAGES/DRESSINGS) ×3 IMPLANT
BANDAGE ACE 4X5 VEL STRL LF (GAUZE/BANDAGES/DRESSINGS) ×3 IMPLANT
BIT DRILL 2.2 SS TIBIAL (BIT) ×3 IMPLANT
BLADE CLIPPER SURG (BLADE) IMPLANT
BNDG ESMARK 4X9 LF (GAUZE/BANDAGES/DRESSINGS) ×3 IMPLANT
BNDG GAUZE ELAST 4 BULKY (GAUZE/BANDAGES/DRESSINGS) ×3 IMPLANT
CANISTER SUCT 3000ML PPV (MISCELLANEOUS) ×3 IMPLANT
CORD BI POLAR (MISCELLANEOUS) ×3 IMPLANT
COVER SURGICAL LIGHT HANDLE (MISCELLANEOUS) ×3 IMPLANT
CUFF TOURNIQUET SINGLE 18IN (TOURNIQUET CUFF) ×3 IMPLANT
CUFF TOURNIQUET SINGLE 24IN (TOURNIQUET CUFF) IMPLANT
DECANTER SPIKE VIAL GLASS SM (MISCELLANEOUS) ×3 IMPLANT
DRAPE OEC MINIVIEW 54X84 (DRAPES) ×3 IMPLANT
DRAPE SURG 17X11 SM STRL (DRAPES) ×3 IMPLANT
DRSG ADAPTIC 3X8 NADH LF (GAUZE/BANDAGES/DRESSINGS) ×3 IMPLANT
GAUZE 4X4 16PLY RFD (DISPOSABLE) ×3 IMPLANT
GAUZE SPONGE 4X4 12PLY STRL (GAUZE/BANDAGES/DRESSINGS) ×3 IMPLANT
GAUZE XEROFORM 5X9 LF (GAUZE/BANDAGES/DRESSINGS) ×3 IMPLANT
GLOVE BIOGEL PI IND STRL 8.5 (GLOVE) ×1 IMPLANT
GLOVE BIOGEL PI INDICATOR 8.5 (GLOVE) ×2
GLOVE SURG ORTHO 8.0 STRL STRW (GLOVE) ×3 IMPLANT
GOWN STRL REUS W/ TWL LRG LVL3 (GOWN DISPOSABLE) ×1 IMPLANT
GOWN STRL REUS W/ TWL XL LVL3 (GOWN DISPOSABLE) ×1 IMPLANT
GOWN STRL REUS W/TWL LRG LVL3 (GOWN DISPOSABLE) ×2
GOWN STRL REUS W/TWL XL LVL3 (GOWN DISPOSABLE) ×2
K-WIRE 1.6 (WIRE) ×2
K-WIRE FX5X1.6XNS BN SS (WIRE) ×1
KIT BASIN OR (CUSTOM PROCEDURE TRAY) ×3 IMPLANT
KIT TURNOVER KIT B (KITS) ×3 IMPLANT
KWIRE FX5X1.6XNS BN SS (WIRE) ×1 IMPLANT
NEEDLE HYPO 25X1 1.5 SAFETY (NEEDLE) ×3 IMPLANT
NS IRRIG 1000ML POUR BTL (IV SOLUTION) ×3 IMPLANT
PACK ORTHO EXTREMITY (CUSTOM PROCEDURE TRAY) ×3 IMPLANT
PAD ARMBOARD 7.5X6 YLW CONV (MISCELLANEOUS) ×6 IMPLANT
PAD CAST 4YDX4 CTTN HI CHSV (CAST SUPPLIES) ×1 IMPLANT
PADDING CAST COTTON 4X4 STRL (CAST SUPPLIES) ×2
PEG LOCKING SMOOTH 2.2X16 (Screw) ×6 IMPLANT
PEG LOCKING SMOOTH 2.2X18 (Peg) ×3 IMPLANT
PEG LOCKING SMOOTH 2.2X20 (Screw) ×9 IMPLANT
PLATE NARROW DVR RIGHT (Plate) ×3 IMPLANT
SCREW LOCK 14X2.7X 3 LD TPR (Screw) ×4 IMPLANT
SCREW LOCKING 2.7X13MM (Screw) ×3 IMPLANT
SCREW LOCKING 2.7X14 (Screw) ×8 IMPLANT
SLING ARM IMMOBILIZER MED (SOFTGOODS) ×3 IMPLANT
SOAP 2 % CHG 4 OZ (WOUND CARE) ×3 IMPLANT
SPONGE LAP 4X18 RFD (DISPOSABLE) ×3 IMPLANT
SUT PROLENE 4 0 PS 2 18 (SUTURE) IMPLANT
SUT VIC AB 2-0 CT1 27 (SUTURE)
SUT VIC AB 2-0 CT1 TAPERPNT 27 (SUTURE) IMPLANT
SUT VICRYL 4-0 PS2 18IN ABS (SUTURE) IMPLANT
SYR CONTROL 10ML LL (SYRINGE) IMPLANT
TOWEL NATURAL 10PK STERILE (DISPOSABLE) ×3 IMPLANT
TOWEL NATURAL 6PK STERILE (DISPOSABLE) IMPLANT
TUBE CONNECTING 12'X1/4 (SUCTIONS) ×1
TUBE CONNECTING 12X1/4 (SUCTIONS) ×2 IMPLANT
WATER STERILE IRR 1000ML POUR (IV SOLUTION) ×3 IMPLANT
YANKAUER SUCT BULB TIP NO VENT (SUCTIONS) IMPLANT

## 2018-09-07 NOTE — Anesthesia Preprocedure Evaluation (Signed)
Anesthesia Evaluation  Patient identified by MRN, date of birth, ID band Patient confused    Reviewed: Patient's Chart, lab work & pertinent test results  Airway Mallampati: II  TM Distance: >3 FB Neck ROM: Full    Dental  (+) Edentulous Upper, Edentulous Lower   Pulmonary Current Smoker, former smoker,    breath sounds clear to auscultation       Cardiovascular hypertension, (-) angina+ Past MI  (-) CHF  Rhythm:Regular     Neuro/Psych  Headaches, PSYCHIATRIC DISORDERS Anxiety TIACVA    GI/Hepatic negative GI ROS, (+) Hepatitis -, C  Endo/Other  negative endocrine ROS  Renal/GU negative Renal ROS     Musculoskeletal Right wrist fx   Abdominal   Peds  Hematology negative hematology ROS (+)   Anesthesia Other Findings   Reproductive/Obstetrics                             Anesthesia Physical Anesthesia Plan  ASA: II  Anesthesia Plan: MAC and Regional   Post-op Pain Management:    Induction:   PONV Risk Score and Plan: 2 and Treatment may vary due to age or medical condition  Airway Management Planned: Nasal Cannula  Additional Equipment: None  Intra-op Plan:   Post-operative Plan:   Informed Consent: I have reviewed the patients History and Physical, chart, labs and discussed the procedure including the risks, benefits and alternatives for the proposed anesthesia with the patient or authorized representative who has indicated his/her understanding and acceptance.   Dental advisory given  Plan Discussed with: CRNA and Surgeon  Anesthesia Plan Comments:         Anesthesia Quick Evaluation

## 2018-09-07 NOTE — Anesthesia Procedure Notes (Addendum)
Procedure Name: LMA Insertion Date/Time: 09/07/2018 5:41 PM Performed by: Orlie Dakin, CRNA Pre-anesthesia Checklist: Patient identified, Emergency Drugs available, Suction available and Patient being monitored Patient Re-evaluated:Patient Re-evaluated prior to induction Oxygen Delivery Method: Circle system utilized Preoxygenation: Pre-oxygenation with 100% oxygen Induction Type: IV induction LMA: LMA inserted LMA Size: 4.0 Tube type: Oral Number of attempts: 1 Placement Confirmation: positive ETCO2 Tube secured with: Tape Dental Injury: Teeth and Oropharynx as per pre-operative assessment

## 2018-09-07 NOTE — Discharge Instructions (Signed)
KEEP BANDAGE CLEAN AND DRY CALL OFFICE FOR F/U APPT [phone] IN 14 DAYS KEEP HAND ELEVATED ABOVE HEART OK TO APPLY ICE TO OPERATIVE AREA CONTACT OFFICE IF ANY WORSENING PAIN OR CONCERNS. 
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
## 2018-09-07 NOTE — Op Note (Signed)
PREOPERATIVE DIAGNOSIS:Right wrist intraarticular distal radius fracture of 2 or more fragments  POSTOPERATIVE DIAGNOSIS:Right wrist intraarticular distal radius fracture of 2 or more fragments  ATTENDING SURGEON:Dr.Federico Maiorino who was scrubbed and present for entire procedure  ASSISTANT SURGEON:Samantha [employer] who scrubbed necessary for internal fixation and repair as indicated splinting and closing  ANESTHESIA:Regional with sedation  OPERATIVE PROCEDURE: 1.Open treatment of right distal radius fracture of 2 or more intraarticular fragments 2. Right distal radius brachioradialis tenotomy 3. Right wrist radiographs 3 views  IMPLANTS: DVR cross lock, narrow  RADIOGRAPHIC INTERPRETATION:AP lateral oblique views the wrist to show the volar plate fixation place in good position  SURGICAL INDICATIONS:patient is a right-hand-dominant female who sustained a comminuted intra-articular distal radius fracture. Patient was seen and evaluated in the office and recommended undergo the above procedure. Risks of surgery include but not limited to bleeding infection damage to nearby nerves arteries or tendons loss of motion of the wrist and digits and need for further surgical intervention  [place] is probably identified in the preoperative holding area and a mark with a permanent marker made on the right wrist indicate the correct operative site. The patient is then brought back to operating room placed supine on the operating room table where the IV sedation was administered. The patient previously undergone the regional anesthesia. A well-padded tourniquet was then placed on the right brachium and sealed with the appropriate drape. The right upper extremity is then prepped and draped in normal sterile fashion. A timeout was called the correct site was identified and the procedure then begun. Attention then turned to the right wrist. A longitudinal incision made directly over the  flexor carpi radialis. Dissection carried down through the skin and subcutaneous tissue. Deep dissection carried down sweeping the FPL out of the way the pronator quadratus was then elevated in an L-shaped fashion. The brachia radialis was then carefully released off the radial styloid. Tendon tenotomy was done to expose the articular intra-articular fracture. This was an intra-articular fracture 2 more fragments. Following this open reduction was then performed and the volar plate was then applied. The volar plate fixation was then placed and held distally with a K wire. Oblong screw hole was then placed proximally. Position was then confirmed using the mini C-arm. After adequate plate position distal fixation was then carried out from an ulnar to radial direction. Distal locking pegs were then placed. Following the shaft fixation was then carried out with shaft screw fixation of the locking and nonlocking screws. The wound was then thoroughly irrigated. After thorough wound irrigation and final radiographs the pronator quadratus was then closed with 2-0 Vicryl suture. The subcutaneous tissues closed with 4-0 Vicryl. The skin closed with simple 4-0 Prolene suture. Adaptic dressing and a sterile compressive bandage then applied. The patient is then placed in well-padded sugar tong splint and taken recovery room in good condition  POSTOPERATIVE PLAN:patient be admitted overnight for IV and a box and pain control social work consult for placement. See her back in the office in 2 weeks for wound check suture removal x-rays application of a short arm cast begin a therapy at the four-week mark place the therapy order at the initial postoperative visit. Radiographs at each visit.

## 2018-09-07 NOTE — Anesthesia Procedure Notes (Addendum)
Anesthesia Regional Block: Supraclavicular block   Pre-Anesthetic Checklist: ,, timeout performed, Correct Patient, Correct Site, Correct Laterality, Correct Procedure, Correct Position, site marked, Risks and benefits discussed,  Surgical consent,  Pre-op evaluation,  At surgeon's request and post-op pain management  Laterality: Upper and Right  Prep: chloraprep       Needles:  Injection technique: Single-shot  Needle Type: Echogenic Needle          Additional Needles:   Procedures:,,,, ultrasound used (permanent image in chart),,,,  Narrative:  Start time: 09/07/2018 4:39 PM End time: 09/07/2018 4:43 PM Injection made incrementally with aspirations every 5 mL.  Performed by: Personally   Additional Notes: H+P and labs reviewed, risks and benefits discussed with patient, procedure tolerated well without complications

## 2018-09-07 NOTE — Transfer of Care (Signed)
Immediate Anesthesia Transfer of Care Note  Patient: Mindy Mills  Procedure(s) Performed: OPEN REDUCTION INTERNAL FIXATION (ORIF) DISTAL RADIAL FRACTURE (Right Wrist)  Patient Location: PACU  Anesthesia Type:General and Regional  Level of Consciousness: drowsy and patient cooperative  Airway & Oxygen Therapy: Patient Spontanous Breathing and Patient connected to face mask oxygen  Post-op Assessment: Report given to RN and Post -op Vital signs reviewed and stable  Post vital signs: Reviewed and stable  Last Vitals:  Vitals Value Taken Time  BP    Temp    Pulse    Resp    SpO2      Last Pain:  Vitals:   09/07/18 1506  TempSrc: Oral         Complications: No apparent anesthesia complications

## 2018-09-07 NOTE — Anesthesia Postprocedure Evaluation (Signed)
Anesthesia Post Note  Patient: Mindy Mills  Procedure(s) Performed: OPEN REDUCTION INTERNAL FIXATION (ORIF) DISTAL RADIAL FRACTURE (Right Wrist)     Patient location during evaluation: PACU Anesthesia Type: Regional Level of consciousness: awake and alert Pain management: pain level controlled Vital Signs Assessment: post-procedure vital signs reviewed and stable Respiratory status: spontaneous breathing, nonlabored ventilation, respiratory function stable and patient connected to nasal cannula oxygen Cardiovascular status: blood pressure returned to baseline and stable Postop Assessment: no apparent nausea or vomiting Anesthetic complications: no    Last Vitals:  Vitals:   09/07/18 2001 09/07/18 2107  BP: (!) 153/84 129/70  Pulse:  64  Resp: 14 18  Temp:  36.9 C  SpO2:  93%    Last Pain:  Vitals:   09/07/18 2107  TempSrc: Oral  PainSc:                  Hiroyuki Ozanich DAVID

## 2018-09-08 ENCOUNTER — Encounter (HOSPITAL_COMMUNITY): Payer: Self-pay | Admitting: Orthopedic Surgery

## 2018-09-08 DIAGNOSIS — S52571A Other intraarticular fracture of lower end of right radius, initial encounter for closed fracture: Secondary | ICD-10-CM | POA: Diagnosis not present

## 2018-09-08 NOTE — Progress Notes (Signed)
CSW received consult for SNF placement. CSW requested PT consult from RN. Patient does not meet Medicare guidelines for placement, but may can see if she can be placed under her Medicaid.   Percell Locus Emmalynn Pinkham LCSW [phone]

## 2018-09-08 NOTE — Plan of Care (Signed)

## 2018-09-08 NOTE — Progress Notes (Signed)
Patient ID: [name], female   DOB: August 20, 1951, 67 y.o.   MRN: [id number]  1 day PO right distal radius ORIF  Mindy Mills is doing well and her pain is well-controlled. She is taking pain medicine as needed.  Social work consult for SNF placement to be completed today. Continue with the splint keeping it clean and dry. Activity as tolerated with limited weightbearing of the right upper extremity.

## 2018-09-09 DIAGNOSIS — S52571A Other intraarticular fracture of lower end of right radius, initial encounter for closed fracture: Secondary | ICD-10-CM | POA: Diagnosis not present

## 2018-09-09 LAB — HIV ANTIBODY (ROUTINE TESTING W REFLEX): HIV SCREEN 4TH GENERATION: NONREACTIVE

## 2018-09-09 MED ORDER — DOCUSATE SODIUM 100 MG PO CAPS: 100.0000 mg | ORAL_CAPSULE | Freq: Two times a day (BID) | ORAL | 0 refills | Status: AC

## 2018-09-09 MED ORDER — HYDROCODONE-ACETAMINOPHEN 5-325 MG PO TABS: 1.0000 | ORAL_TABLET | Freq: Four times a day (QID) | ORAL | 0 refills | Status: AC | PRN

## 2018-09-09 MED ORDER — METHOCARBAMOL 500 MG PO TABS: 500.0000 mg | ORAL_TABLET | Freq: Four times a day (QID) | ORAL | 0 refills | Status: DC | PRN

## 2018-09-09 NOTE — NC FL2 (Signed)
[place] LEVEL OF CARE SCREENING TOOL     IDENTIFICATION  Patient Name: Mindy Mills Birthdate: Sep 06, 1951 Sex: female Admission Date (Current Location): 09/07/2018  [facility] and [place] Number:  Herbalist and Address:  [facility], [place] [address], [place], [place] [address]      Provider Number: [id number]  Attending Physician Name and Address:  Iran Planas, MD  Relative Name and Phone Number:       Current Level of Care: Hospital Recommended Level of Care: [place] Prior Approval Number:    Date Approved/Denied:   PASRR Number: [id number]  Discharge Plan: SNF    Current Diagnoses: Patient Active Problem List   Diagnosis Date Noted  . Closed fracture of right distal radius 09/07/2018  . Fall 09/08/2015  . UTI (lower urinary tract infection) 09/08/2015  . Dehydration 09/08/2015  . Sepsis ([place]) 09/08/2015  . Systolic heart failure ([place]) 09/08/2015  . Confusion 09/08/2015  . TIA (transient ischemic attack) 09/08/2015  . Acute on chronic respiratory failure ([place]) 09/24/2014  . 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 Demand ischemia of myocardium: in setting of CVA 09/17/2014  . 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 Subarachnoid hemorrhage due to ruptured aneurysm ([place]) 09/11/2014  . Acute respiratory failure ([place]) 09/11/2014  . Hypokalemia 09/11/2014  . Altered mental status 09/11/2014  . Subarachnoid hemorrhage ([place]) 09/11/2014    Orientation RESPIRATION BLADDER Height & Weight     Self, Time, Situation, Place  Normal Continent Weight: 109 lb (49.4 kg) Height:  5\' 3"  (160 cm)  BEHAVIORAL SYMPTOMS/MOOD NEUROLOGICAL BOWEL NUTRITION STATUS      Continent Diet(Regular diet, thin liquids)  AMBULATORY STATUS COMMUNICATION OF NEEDS Skin   Limited Assist Verbally Surgical wounds(Right arm, compression wrapped)                        Personal Care Assistance Level of Assistance  Bathing, Feeding, Dressing Bathing Assistance: Limited assistance Feeding assistance: Independent Dressing Assistance: Limited assistance     Functional Limitations Info  Hearing, Speech, Sight Sight Info: Adequate Hearing Info: Adequate Speech Info: Adequate    SPECIAL CARE FACTORS FREQUENCY  PT (By licensed PT), OT (By licensed OT)     PT Frequency: 3x OT Frequency: 3x            Contractures Contractures Info: Not present    Additional Factors Info  Code Status, Allergies Code Status Info: Full Code Allergies Info: Bee Venom, Capsicum Gretta Arab) [place]           Current Medications (09/09/2018):  This is the current hospital active medication list Current Facility-Administered Medications  Medication Dose Route Frequency Provider Last Rate Last Dose  . acetaminophen (TYLENOL) tablet 650 mg  650 mg Oral Q6H PRN Brynda Peon, PA       Or  . acetaminophen (TYLENOL) suppository 650 mg  650 mg Rectal Q6H PRN Brynda Peon, PA      . acetaminophen (TYLENOL) tablet 1,000 mg  1,000 mg Oral Q6H Gertie Fey [place], [place]   1,000 mg at 09/08/18 2339  . aspirin EC tablet 325 mg  325 mg Oral Daily Gertie Fey [place], [place]   325 mg at 09/09/18 1056  . bisacodyl (DULCOLAX) EC tablet 5 mg  5 mg Oral Daily PRN Gertie Fey [place], PA      . cholecalciferol (VITAMIN D) tablet 1,000  Units  1,000 Units Oral Daily Gertie Fey [place], [place]   1,000 Units at 09/09/18 1057  . clonazePAM (KLONOPIN) tablet 0.5 mg  0.5 mg Oral BID Gertie Fey [place], PA   0.5 mg at 09/09/18 1057  . diphenhydrAMINE (BENADRYL) 12.5 MG/5ML elixir 12.5-25 mg  12.5-25 mg Oral Q4H PRN Gertie Fey Bonham, PA      . docusate sodium (COLACE) capsule 100 mg  100 mg Oral BID Gertie Fey [place], PA   100 mg at 09/09/18 1057  . EPINEPHrine (EPI-PEN) injection 0.3 mg  0.3 mg Intramuscular Once PRN Gertie Fey [place],  PA      . feeding supplement (ENSURE ENLIVE) (ENSURE ENLIVE) liquid 237 mL  237 mL Oral Q24H Gertie Fey [place], [place]   237 mL at 09/08/18 1937  . FLUoxetine (PROZAC) capsule 80 mg  80 mg Oral Daily Gertie Fey [place], [place]   80 mg at 09/09/18 1057  . HYDROcodone-acetaminophen (NORCO/VICODIN) 5-325 MG per tablet 1-2 tablet  1-2 tablet Oral Q4H PRN Brynda Peon, PA   2 tablet at 09/08/18 1931  . magnesium citrate solution 1 Bottle  1 Bottle Oral Once PRN Brynda Peon, PA      . methocarbamol (ROBAXIN) tablet 500 mg  500 mg Oral Q6H PRN Brynda Peon, PA       Or  . methocarbamol (ROBAXIN) 500 mg in dextrose 5 % 50 mL IVPB  500 mg Intravenous Q6H PRN Gertie Fey [place], PA      . morphine 2 MG/ML injection 1 mg  1 mg Intravenous Q2H PRN Gertie Fey [place], PA      . ondansetron [facility]) tablet 4 mg  4 mg Oral Q6H PRN Brynda Peon, PA       Or  . ondansetron [facility]) injection 4 mg  4 mg Intravenous Q6H PRN Gertie Fey [place], PA      . pantoprazole (PROTONIX) EC tablet 40 mg  40 mg Oral QHS Gertie Fey [place], [place]   40 mg at 09/08/18 2104  . rosuvastatin (CRESTOR) tablet 20 mg  20 mg Oral Daily Gertie Fey [place], [place]   20 mg at 09/09/18 1056  . senna-docusate (Senokot-S) tablet 1 tablet  1 tablet Oral QHS PRN Brynda Peon, PA      . traZODone (DESYREL) tablet 200 mg  200 mg Oral QHS Gertie Fey [place], [place]   200 mg at 09/08/18 2104  . zolpidem (AMBIEN) tablet 5 mg  5 mg Oral QHS PRN Brynda Peon, PA         Discharge Medications: Please see discharge summary for a list of discharge medications.  Relevant Imaging Results:  Relevant Lab Results:   Additional Information SSN; [id number]  Eileen Stanford, LCSW

## 2018-09-09 NOTE — Evaluation (Signed)
Physical Therapy Evaluation Patient Details Name: Mindy Mills MRN: [id number] DOB: 25-Aug-1951 Today's Date: 09/09/2018   History of Present Illness  Pt is a 67 y/o female s/p R distal radial ORIF. PMH including but not limited to CVA, MI and cervicle cancer.  Clinical Impression  Pt presented supine in bed with HOB elevated, awake and willing to participate in therapy session. Prior to admission, pt reported that she was ambulating with use of a cane and independent with ADLs. Pt does not have consistent support if she were to d/c home. She currently requires min A for bed mobility and mod A for transfers. She would greatly benefit from short-term rehab at a SNF prior to returning home. Pt would continue to benefit from skilled physical therapy services at this time while admitted and after d/c to address the below listed limitations in order to improve overall safety and independence with functional mobility.     Follow Up Recommendations SNF    Equipment Recommendations  None recommended by PT    Recommendations for Other Services       Precautions / Restrictions Precautions Precautions: Fall Required Braces or Orthoses: Sling(R UE) Restrictions Weight Bearing Restrictions: Yes Other Position/Activity Restrictions: no MD orders - "limited [place] of R UE" per ortho note      Mobility  Bed Mobility Overal bed mobility: Needs Assistance Bed Mobility: Supine to Sit     Supine to sit: Min assist     General bed mobility comments: increased time and effort, assist with trunk elevation  Transfers Overall transfer level: Needs assistance Equipment used: 1 person hand held assist Transfers: Sit to/from [facility] Sit to Stand: Mod assist Stand pivot transfers: Mod assist       General transfer comment: increased time and effort, use of momentum, assist to power into standing, assist to stabilize upon standing as pt with heavy posterior  lean  Ambulation/Gait                Stairs            Wheelchair Mobility    Modified Rankin (Stroke Patients Only)       Balance Overall balance assessment: Needs assistance Sitting-balance support: Feet supported Sitting balance-Leahy Scale: Fair     Standing balance support: During functional activity;Single extremity supported Standing balance-Leahy Scale: Poor Standing balance comment: mod A to maintain static standing; posterior lean, pt unable to self correct                             Pertinent Vitals/Pain Pain Assessment: Faces Faces Pain Scale: Hurts little more Pain Location: R wrist Pain Descriptors / Indicators: Sore Pain Intervention(s): Monitored during session;Repositioned    Home Living Family/patient expects to be discharged to:: Private residence Living Arrangements: Alone Available Help at Discharge: Family;Available PRN/intermittently Type of Home: Apartment Home Access: Elevator     Home Layout: One level Home Equipment: Cane - single point      Prior Function Level of Independence: Independent with assistive device(s)         Comments: ambulated with cane at all times     Hand Dominance   Dominant Hand: Right    Extremity/Trunk Assessment   Upper Extremity Assessment Upper Extremity Assessment: Defer to OT evaluation    Lower Extremity Assessment Lower Extremity Assessment: Generalized weakness       Communication   Communication: No difficulties  Cognition Arousal/Alertness: Awake/alert Behavior During  Therapy: WFL for tasks assessed/performed Overall Cognitive Status: Within Functional Limits for tasks assessed                                 General Comments: cognition not formally assessed but [facility] for general conversation      General Comments      Exercises     Assessment/Plan    PT Assessment Patient needs continued PT services  PT Problem List Decreased  strength;Decreased range of motion;Decreased activity tolerance;Decreased balance;Decreased mobility;Decreased coordination;Decreased knowledge of use of DME;Decreased safety awareness;Decreased knowledge of precautions;Pain       PT Treatment Interventions DME instruction;Gait training;Functional mobility training;Stair training;Therapeutic activities;Therapeutic exercise;Balance training;Neuromuscular re-education;Patient/family education    PT Goals (Current goals can be found in the Care Plan section)  Acute Rehab PT Goals Patient Stated Goal: decrease pain PT Goal Formulation: With patient Time For Goal Achievement: 09/23/18 Potential to Achieve Goals: Good    Frequency Min 3X/week   Barriers to discharge Decreased caregiver support      Co-evaluation               AM-PAC PT "6 Clicks" Daily Activity  Outcome Measure Difficulty turning over in bed (including adjusting bedclothes, sheets and blankets)?: Unable Difficulty moving from lying on back to sitting on the side of the bed? : Unable Difficulty sitting down on and standing up from a chair with arms (e.g., wheelchair, bedside commode, etc,.)?: Unable Help needed moving to and from a bed to chair (including a wheelchair)?: A Lot Help needed walking in hospital room?: A Lot Help needed climbing 3-5 steps with a railing? : A Lot 6 Click Score: 9    End of Session Equipment Utilized During Treatment: Gait belt;Other (comment)(R UE sling) Activity Tolerance: Patient tolerated treatment well Patient left: in chair;with call bell/phone within reach Nurse Communication: Mobility status PT Visit Diagnosis: Other abnormalities of gait and mobility (R26.89);Pain Pain - Right/Left: Right Pain - part of body: Arm    Time: 9628-3662 PT Time Calculation (min) (ACUTE ONLY): 14 min   Charges:   PT Evaluation $PT Eval Moderate Complexity: 1 Mod          Sherie Don, PT, DPT  Acute Rehabilitation Services Pager  [phone] Office [place] 09/09/2018, 11:50 AM

## 2018-09-09 NOTE — Care Management Obs Status (Signed)
[place] NOTIFICATION   Patient Details  Name: CORINNE GOUCHER MRN: [id number] Date of Birth: Feb 10, 1951   Medicare Observation Status Notification Given:  Yes    Erenest Rasher, RN 09/09/2018, 11:57 AM
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
## 2018-09-09 NOTE — Evaluation (Signed)
Occupational Therapy Evaluation and defer to next venue of care  Patient Details Name: Mindy Mills MRN: [id number] DOB: 03-29-1951 Today's Date: 09/09/2018    History of Present Illness Pt is a 67 y/o female s/p R distal radial ORIF. PMH including but not limited to CVA, MI and cervicle cancer.   Clinical Impression   PTA Pt independent in ADL and mobility with SPC (although reports difficulty getting in and out of tub and some falls). Pt is currently requiring mod A overall for ADL and transfers, very unsteady on feet and requires extra assist for BUE tasks. Pt educated to move all parts of RUE that are not in splint to assist with edema management as well as elevation. Pt will benefit from continued OT but will defer further sessions to next venue of care. Thank you for the opportunity to serve this patient.     Follow Up Recommendations  SNF;Supervision/Assistance - 24 hour    Equipment Recommendations  Other (comment)(defer to next venue of care)    Recommendations for Other Services       Precautions / Restrictions Precautions Precautions: Fall Required Braces or Orthoses: Sling(R UE) Restrictions Weight Bearing Restrictions: Yes Other Position/Activity Restrictions: no MD orders - "limited [place] of R UE" per ortho note      Mobility Bed Mobility Overal bed mobility: Needs Assistance Bed Mobility: Sit to Supine     Supine to sit: Min assist     General bed mobility comments: increased time and effort, assist with trunk lowering to bed, Pt able to manage BLE  Transfers Overall transfer level: Needs assistance Equipment used: 1 person hand held assist Transfers: Sit to/from [employer] Transfers Sit to Stand: Mod assist Stand pivot transfers: Mod assist       General transfer comment: increased time and effort, use of momentum, assist to power into standing, assist to stabilize upon standing as pt with heavy posterior lean    Balance Overall balance  assessment: Needs assistance Sitting-balance support: Feet supported Sitting balance-Leahy Scale: Fair     Standing balance support: During functional activity;Single extremity supported Standing balance-Leahy Scale: Poor Standing balance comment: mod A to maintain static standing; posterior lean, pt unable to self correct                           ADL either performed or assessed with clinical judgement   ADL Overall ADL's : Needs assistance/impaired Eating/Feeding: Minimal assistance;Sitting Eating/Feeding Details (indicate cue type and reason): assist for BUE tasks (cutting food) Grooming: Moderate assistance;Sitting Grooming Details (indicate cue type and reason): for BUE tasks Upper Body Bathing: Minimal assistance;Sitting   Lower Body Bathing: Minimal assistance;Sitting/lateral leans   Upper Body Dressing : Maximal assistance;Sitting Upper Body Dressing Details (indicate cue type and reason): educated to dress operated arm first Lower Body Dressing: Sit to/from stand;Maximal assistance Lower Body Dressing Details (indicate cue type and reason): assist for boost, and for management of clothing Toilet Transfer: Moderate assistance;Stand-pivot Toilet Transfer Details (indicate cue type and reason): simulated through recliner to bed transfer Toileting- Clothing Manipulation and Hygiene: Sitting/lateral lean;Minimal assistance       Functional mobility during ADLs: Moderate assistance(SPT only) General ADL Comments: Pt requires assist for BUE tasks, decreased balance and safety when on her feet requiring grooming tasks etc in sitting, did not attempt tub transfer at this time     Vision Baseline Vision/History: Wears glasses Wears Glasses: At all times Patient Visual Report: No  change from baseline Vision Assessment?: No apparent visual deficits Additional Comments: Pt reports no changes - glasses broken though     Perception     Praxis      Pertinent  Vitals/Pain Pain Assessment: Faces Faces Pain Scale: Hurts even more Pain Location: R wrist Pain Descriptors / Indicators: Sore Pain Intervention(s): Monitored during session;Repositioned(declined ice)     Hand Dominance Right   Extremity/Trunk Assessment Upper Extremity Assessment Upper Extremity Assessment: RUE deficits/detail RUE Deficits / Details: able to move fingers and shoulder (painful at both) RUE: Unable to fully assess due to immobilization RUE Sensation: WNL RUE Coordination: decreased fine motor;decreased gross motor   Lower Extremity Assessment Lower Extremity Assessment: Defer to PT evaluation       Communication Communication Communication: No difficulties   Cognition Arousal/Alertness: Awake/alert Behavior During Therapy: WFL for tasks assessed/performed Overall Cognitive Status: Within Functional Limits for tasks assessed                                     General Comments       Exercises Exercises: Other exercises Other Exercises Other Exercises: digits on RUE flexion/extension Other Exercises: shoulder ROM as pain allows   Shoulder Instructions      Home Living Family/patient expects to be discharged to:: Private residence Living Arrangements: Alone Available Help at Discharge: Family;Available PRN/intermittently Type of Home: Apartment Home Access: Elevator     Home Layout: One level     Bathroom Shower/Tub: Teacher, early years/pre: Standard     Home Equipment: Cane - single point          Prior Functioning/Environment Level of Independence: Independent with assistive device(s)        Comments: ambulated with cane at all times        OT Problem List: Decreased range of motion;Impaired balance (sitting and/or standing);Decreased activity tolerance;Decreased safety awareness;Decreased knowledge of use of DME or AE;Decreased knowledge of precautions;Impaired UE functional use;Pain      OT  Treatment/Interventions:      OT Goals(Current goals can be found in the care plan section) Acute Rehab OT Goals Patient Stated Goal: decrease pain, get more balance OT Goal Formulation: With patient Time For Goal Achievement: 09/23/18 Potential to Achieve Goals: Good  OT Frequency:     Barriers to D/C:            Co-evaluation              AM-PAC PT "6 Clicks" Daily Activity     Outcome Measure Help from another person eating meals?: A Little Help from another person taking care of personal grooming?: A Little Help from another person toileting, which includes using toliet, bedpan, or urinal?: A Lot Help from another person bathing (including washing, rinsing, drying)?: A Lot Help from another person to put on and taking off regular upper body clothing?: A Lot Help from another person to put on and taking off regular lower body clothing?: A Lot 6 Click Score: 14   End of Session Equipment Utilized During Treatment: Gait belt Nurse Communication: Mobility status;Precautions  Activity Tolerance: Patient tolerated treatment well Patient left: in bed;with call bell/phone within reach  OT Visit Diagnosis: Unsteadiness on feet (R26.81);Other abnormalities of gait and mobility (R26.89);Pain;History of falling (Z91.81) Pain - Right/Left: Right Pain - part of body: Arm  Time: 4196-2229 OT Time Calculation (min): 14 min Charges:  OT General Charges $OT Visit: 1 Visit OT Evaluation $OT Eval Moderate Complexity: [place] OTR/L Acute Rehabilitation Services Pager: [phone] Office: [place] 09/09/2018, 3:49 PM

## 2018-09-09 NOTE — Clinical Social Work Note (Signed)
CSW searching for placement for pt, for Medicaid bed.    [place], [place]

## 2018-09-09 NOTE — Clinical Social Work Note (Signed)
Clinical Social Worker facilitated patient discharge including contacting patient family and facility to confirm patient discharge plans.  Clinical information faxed to facility and family agreeable with plan.  CSW arranged ambulance transport via [facility] to [facility] (room 307A).  RN to call [phone] for report prior to discharge.  Clinical Social Worker will sign off for now as social work intervention is no longer needed. Please consult [place] again if new need arises.  [place], [place]

## 2018-09-09 NOTE — Progress Notes (Signed)
[facility] arrived to transport patient to SNF.  Patient is alert and oriented x 4.  Patient received pain medication around 2115.  Patient is ready for transport.
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
## 2018-09-09 NOTE — Progress Notes (Signed)
Report given to [facility], Nurse. All questions were answered.

## 2018-09-09 NOTE — Clinical Social Work Note (Signed)
Clinical Social Work Assessment  Patient Details  Name: Mindy Mills MRN: [id number] Date of Birth: Mar 06, 1951  Date of referral:  09/09/18               Reason for consult:  Facility Placement                Permission sought to share information with:  Family Supports Permission granted to share information::  Yes, Verbal Permission Granted  Name::     Building control surveyor::  [employer]  Relationship::  Daughter in Financial trader Information:     Housing/Transportation Living arrangements for the past 2 months:  [place] of Information:  Patient, Other (Comment Required) Patient Interpreter Needed:  None Criminal Activity/Legal Involvement Pertinent to Current Situation/Hospitalization:  No - Comment as needed Significant Relationships:  Other Family Members Lives with:  Self Do you feel safe going back to the place where you live?  No Need for family participation in patient care:  No (Coment)  Care giving concerns:  Pt is alert and oriented.   Social Worker assessment / plan:  *CSW spoke with pt at bedside. Pt is agreeable to going to SNF utilizing her Medicaid. Mindy Mills has agreed to take pt. Pt is agreeable. Pt's daughter in law is agreeable as well. Pt will transition today. MD notified.  Employment status:  Retired Forensic scientist:  Information systems manager, Medicaid In [place] PT Recommendations:  [place] / Referral to community resources:  [place]  Patient/Family's Response to care:  Pt verbalized understanding of CSW role and expressed appreciation for support. Pt denies any concern regarding pt care at this time.   Patient/Family's Understanding of and Emotional Response to Diagnosis, Current Treatment, and Prognosis:  Pt understanding and realistic regarding physical limitations. Pt understands the need for SNF placement at d/c. Pt agreeable to SNF placement at d/c, at this time. Pt's responses emotionally appropriate  during conversation with CSW. Pt denies any concern regarding treatment plan at this time. CSW will continue to provide support and facilitate d/c needs.   Emotional Assessment Appearance:  Appears stated age Attitude/Demeanor/Rapport:  (Patient was appropriate) Affect (typically observed):  Accepting, Appropriate, Calm Orientation:  Oriented to Situation, Oriented to  Time, Oriented to Place, Oriented to Self Alcohol / Substance use:  Not Applicable Psych involvement (Current and /or in the community):  No (Comment)  Discharge Needs  Concerns to be addressed:  Basic Needs, Care Coordination Readmission within the last 30 days:  No Current discharge risk:  Dependent with Mobility Barriers to Discharge:  Continued Medical Work up, Inadequate or no insurance   [place], LCSW 09/09/2018, 3:01 PM

## 2018-09-09 NOTE — Clinical Social Work Placement (Signed)
   CLINICAL SOCIAL WORK PLACEMENT  NOTE  Date:  09/09/2018  Patient Details  Name: [name] MRN: [id number] Date of Birth: 1951-02-11  Clinical Social Work is seeking post-discharge placement for this patient at the [place] level of care (*CSW will initial, date and re-position this form in  chart as items are completed):      Patient/family provided with [facility] Work Department's list of facilities offering this level of care within the geographic area requested by the patient (or if unable, by the patient's family).  Yes   Patient/family informed of their freedom to choose among providers that offer the needed level of care, that participate in Medicare, Medicaid or managed care program needed by the patient, have an available bed and are willing to accept the patient.      Patient/family informed of [facility]'s ownership interest in [facility] and [facility], as well as of the fact that they are under no obligation to receive care at these facilities.  PASRR submitted to EDS on       PASRR number received on 09/09/18     Existing PASRR number confirmed on       FL2 transmitted to all facilities in geographic area requested by pt/family on 09/09/18     FL2 transmitted to all facilities within larger geographic area on       Patient informed that his/her managed care company has contracts with or will negotiate with certain facilities, including the following:        Yes   Patient/family informed of bed offers received.  Patient [facility] bed at [facility]     Physician recommends and patient chooses bed at      Patient to be transferred to [place] on 09/09/18.  Patient to be transferred to facility by [facility]     Patient family notified on 09/09/18 of transfer.  Name of family member notified:  Erasmo Downer     PHYSICIAN Please prepare priority discharge summary, including medications, Please prepare prescriptions, Please  sign FL2     Additional Comment:    _______________________________________________ Eileen Stanford, LCSW 09/09/2018, 3:03 PM

## 2020-06-13 DIAGNOSIS — G8918 Other acute postprocedural pain: Secondary | ICD-10-CM | POA: Diagnosis not present

## 2020-06-13 DIAGNOSIS — G8929 Other chronic pain: Secondary | ICD-10-CM | POA: Diagnosis not present

## 2020-06-13 DIAGNOSIS — G894 Chronic pain syndrome: Secondary | ICD-10-CM | POA: Diagnosis not present

## 2020-06-13 DIAGNOSIS — M150 Primary generalized (osteo)arthritis: Secondary | ICD-10-CM | POA: Diagnosis not present

## 2020-06-13 DIAGNOSIS — G8928 Other chronic postprocedural pain: Secondary | ICD-10-CM | POA: Diagnosis not present

## 2020-06-17 DIAGNOSIS — M797 Fibromyalgia: Secondary | ICD-10-CM | POA: Diagnosis not present

## 2020-06-17 DIAGNOSIS — G894 Chronic pain syndrome: Secondary | ICD-10-CM | POA: Diagnosis not present

## 2020-06-17 DIAGNOSIS — J9691 Respiratory failure, unspecified with hypoxia: Secondary | ICD-10-CM | POA: Diagnosis not present

## 2020-06-17 DIAGNOSIS — M150 Primary generalized (osteo)arthritis: Secondary | ICD-10-CM | POA: Diagnosis not present

## 2020-06-17 DIAGNOSIS — G8918 Other acute postprocedural pain: Secondary | ICD-10-CM | POA: Diagnosis not present

## 2020-06-18 DIAGNOSIS — G8918 Other acute postprocedural pain: Secondary | ICD-10-CM | POA: Diagnosis not present

## 2020-06-18 DIAGNOSIS — G8928 Other chronic postprocedural pain: Secondary | ICD-10-CM | POA: Diagnosis not present

## 2020-06-18 DIAGNOSIS — M150 Primary generalized (osteo)arthritis: Secondary | ICD-10-CM | POA: Diagnosis not present

## 2020-06-18 DIAGNOSIS — G894 Chronic pain syndrome: Secondary | ICD-10-CM | POA: Diagnosis not present

## 2020-06-18 DIAGNOSIS — G8929 Other chronic pain: Secondary | ICD-10-CM | POA: Diagnosis not present

## 2020-06-20 DIAGNOSIS — G8918 Other acute postprocedural pain: Secondary | ICD-10-CM | POA: Diagnosis not present

## 2020-06-20 DIAGNOSIS — M150 Primary generalized (osteo)arthritis: Secondary | ICD-10-CM | POA: Diagnosis not present

## 2020-06-20 DIAGNOSIS — G8928 Other chronic postprocedural pain: Secondary | ICD-10-CM | POA: Diagnosis not present

## 2020-06-20 DIAGNOSIS — G894 Chronic pain syndrome: Secondary | ICD-10-CM | POA: Diagnosis not present

## 2020-06-20 DIAGNOSIS — G8929 Other chronic pain: Secondary | ICD-10-CM | POA: Diagnosis not present

## 2020-06-24 DIAGNOSIS — G8928 Other chronic postprocedural pain: Secondary | ICD-10-CM | POA: Diagnosis not present

## 2020-06-24 DIAGNOSIS — G894 Chronic pain syndrome: Secondary | ICD-10-CM | POA: Diagnosis not present

## 2020-06-24 DIAGNOSIS — G8918 Other acute postprocedural pain: Secondary | ICD-10-CM | POA: Diagnosis not present

## 2020-06-24 DIAGNOSIS — M150 Primary generalized (osteo)arthritis: Secondary | ICD-10-CM | POA: Diagnosis not present

## 2020-06-24 DIAGNOSIS — G8929 Other chronic pain: Secondary | ICD-10-CM | POA: Diagnosis not present

## 2020-06-25 DIAGNOSIS — M150 Primary generalized (osteo)arthritis: Secondary | ICD-10-CM | POA: Diagnosis not present

## 2020-06-25 DIAGNOSIS — G894 Chronic pain syndrome: Secondary | ICD-10-CM | POA: Diagnosis not present

## 2020-06-25 DIAGNOSIS — G8918 Other acute postprocedural pain: Secondary | ICD-10-CM | POA: Diagnosis not present

## 2020-06-25 DIAGNOSIS — G8929 Other chronic pain: Secondary | ICD-10-CM | POA: Diagnosis not present

## 2020-06-25 DIAGNOSIS — G8928 Other chronic postprocedural pain: Secondary | ICD-10-CM | POA: Diagnosis not present

## 2020-06-27 DIAGNOSIS — G894 Chronic pain syndrome: Secondary | ICD-10-CM | POA: Diagnosis not present

## 2020-06-27 DIAGNOSIS — G8918 Other acute postprocedural pain: Secondary | ICD-10-CM | POA: Diagnosis not present

## 2020-06-27 DIAGNOSIS — M150 Primary generalized (osteo)arthritis: Secondary | ICD-10-CM | POA: Diagnosis not present

## 2020-06-27 DIAGNOSIS — G8928 Other chronic postprocedural pain: Secondary | ICD-10-CM | POA: Diagnosis not present

## 2020-06-27 DIAGNOSIS — G8929 Other chronic pain: Secondary | ICD-10-CM | POA: Diagnosis not present

## 2020-07-01 DIAGNOSIS — M150 Primary generalized (osteo)arthritis: Secondary | ICD-10-CM | POA: Diagnosis not present

## 2020-07-01 DIAGNOSIS — G8918 Other acute postprocedural pain: Secondary | ICD-10-CM | POA: Diagnosis not present

## 2020-07-01 DIAGNOSIS — G8928 Other chronic postprocedural pain: Secondary | ICD-10-CM | POA: Diagnosis not present

## 2020-07-01 DIAGNOSIS — G894 Chronic pain syndrome: Secondary | ICD-10-CM | POA: Diagnosis not present

## 2020-07-01 DIAGNOSIS — G8929 Other chronic pain: Secondary | ICD-10-CM | POA: Diagnosis not present

## 2020-07-02 DIAGNOSIS — M150 Primary generalized (osteo)arthritis: Secondary | ICD-10-CM | POA: Diagnosis not present

## 2020-07-02 DIAGNOSIS — G8918 Other acute postprocedural pain: Secondary | ICD-10-CM | POA: Diagnosis not present

## 2020-07-02 DIAGNOSIS — G8928 Other chronic postprocedural pain: Secondary | ICD-10-CM | POA: Diagnosis not present

## 2020-07-02 DIAGNOSIS — G8929 Other chronic pain: Secondary | ICD-10-CM | POA: Diagnosis not present

## 2020-07-02 DIAGNOSIS — G894 Chronic pain syndrome: Secondary | ICD-10-CM | POA: Diagnosis not present

## 2020-07-04 DIAGNOSIS — G8928 Other chronic postprocedural pain: Secondary | ICD-10-CM | POA: Diagnosis not present

## 2020-07-04 DIAGNOSIS — G8918 Other acute postprocedural pain: Secondary | ICD-10-CM | POA: Diagnosis not present

## 2020-07-04 DIAGNOSIS — G894 Chronic pain syndrome: Secondary | ICD-10-CM | POA: Diagnosis not present

## 2020-07-04 DIAGNOSIS — M150 Primary generalized (osteo)arthritis: Secondary | ICD-10-CM | POA: Diagnosis not present

## 2020-07-04 DIAGNOSIS — G8929 Other chronic pain: Secondary | ICD-10-CM | POA: Diagnosis not present

## 2020-07-08 DIAGNOSIS — G8918 Other acute postprocedural pain: Secondary | ICD-10-CM | POA: Diagnosis not present

## 2020-07-08 DIAGNOSIS — G8929 Other chronic pain: Secondary | ICD-10-CM | POA: Diagnosis not present

## 2020-07-08 DIAGNOSIS — G894 Chronic pain syndrome: Secondary | ICD-10-CM | POA: Diagnosis not present

## 2020-07-08 DIAGNOSIS — G8928 Other chronic postprocedural pain: Secondary | ICD-10-CM | POA: Diagnosis not present

## 2020-07-08 DIAGNOSIS — M150 Primary generalized (osteo)arthritis: Secondary | ICD-10-CM | POA: Diagnosis not present

## 2020-07-09 DIAGNOSIS — G8929 Other chronic pain: Secondary | ICD-10-CM | POA: Diagnosis not present

## 2020-07-09 DIAGNOSIS — G8928 Other chronic postprocedural pain: Secondary | ICD-10-CM | POA: Diagnosis not present

## 2020-07-09 DIAGNOSIS — G8918 Other acute postprocedural pain: Secondary | ICD-10-CM | POA: Diagnosis not present

## 2020-07-09 DIAGNOSIS — G894 Chronic pain syndrome: Secondary | ICD-10-CM | POA: Diagnosis not present

## 2020-07-09 DIAGNOSIS — M150 Primary generalized (osteo)arthritis: Secondary | ICD-10-CM | POA: Diagnosis not present

## 2020-07-11 DIAGNOSIS — G894 Chronic pain syndrome: Secondary | ICD-10-CM | POA: Diagnosis not present

## 2020-07-11 DIAGNOSIS — M150 Primary generalized (osteo)arthritis: Secondary | ICD-10-CM | POA: Diagnosis not present

## 2020-07-11 DIAGNOSIS — G8918 Other acute postprocedural pain: Secondary | ICD-10-CM | POA: Diagnosis not present

## 2020-07-11 DIAGNOSIS — G8929 Other chronic pain: Secondary | ICD-10-CM | POA: Diagnosis not present

## 2020-07-11 DIAGNOSIS — G8928 Other chronic postprocedural pain: Secondary | ICD-10-CM | POA: Diagnosis not present

## 2020-07-16 DIAGNOSIS — G8918 Other acute postprocedural pain: Secondary | ICD-10-CM | POA: Diagnosis not present

## 2020-07-16 DIAGNOSIS — M150 Primary generalized (osteo)arthritis: Secondary | ICD-10-CM | POA: Diagnosis not present

## 2020-07-16 DIAGNOSIS — G894 Chronic pain syndrome: Secondary | ICD-10-CM | POA: Diagnosis not present

## 2020-07-16 DIAGNOSIS — G8929 Other chronic pain: Secondary | ICD-10-CM | POA: Diagnosis not present

## 2020-07-16 DIAGNOSIS — G8928 Other chronic postprocedural pain: Secondary | ICD-10-CM | POA: Diagnosis not present

## 2020-10-10 ENCOUNTER — Emergency Department (HOSPITAL_COMMUNITY): Payer: Medicare Other

## 2020-10-10 ENCOUNTER — Encounter (HOSPITAL_COMMUNITY): Payer: Self-pay

## 2020-10-10 ENCOUNTER — Emergency Department (HOSPITAL_COMMUNITY)
Admission: EM | Admit: 2020-10-10 | Discharge: 2020-10-10 | Disposition: A | Discharge status: Home / Self Care | Payer: Medicare Other | Attending: Emergency Medicine | Admitting: Emergency Medicine

## 2020-10-10 DIAGNOSIS — I5020 Unspecified systolic (congestive) heart failure: Secondary | ICD-10-CM | POA: Diagnosis not present

## 2020-10-10 DIAGNOSIS — R55 Syncope and collapse: Secondary | ICD-10-CM | POA: Insufficient documentation

## 2020-10-10 DIAGNOSIS — I110 Hypertensive heart disease with heart failure: Secondary | ICD-10-CM | POA: Insufficient documentation

## 2020-10-10 DIAGNOSIS — Z7982 Long term (current) use of aspirin: Secondary | ICD-10-CM | POA: Insufficient documentation

## 2020-10-10 DIAGNOSIS — Z87891 Personal history of nicotine dependence: Secondary | ICD-10-CM | POA: Diagnosis not present

## 2020-10-10 DIAGNOSIS — Z8541 Personal history of malignant neoplasm of cervix uteri: Secondary | ICD-10-CM | POA: Insufficient documentation

## 2020-10-10 LAB — COMPREHENSIVE METABOLIC PANEL
ALT: 13 U/L (ref 0–44)
AST: 17 U/L (ref 15–41)
Albumin: 3.1 g/dL — ABNORMAL LOW (ref 3.5–5.0)
Alkaline Phosphatase: 52 U/L (ref 38–126)
Anion gap: 7 (ref 5–15)
BUN: 21 mg/dL (ref 8–23)
CO2: 27 mmol/L (ref 22–32)
Calcium: 8.6 mg/dL — ABNORMAL LOW (ref 8.9–10.3)
Chloride: 107 mmol/L (ref 98–111)
Creatinine, Ser: 1.12 mg/dL — ABNORMAL HIGH (ref 0.44–1.00)
GFR, Estimated: 50 mL/min — ABNORMAL LOW (ref >60)
Glucose, Bld: 104 mg/dL — ABNORMAL HIGH (ref 70–99)
Potassium: 4.2 mmol/L (ref 3.5–5.1)
Sodium: 141 mmol/L (ref 135–145)
Total Bilirubin: 0.6 mg/dL (ref 0.3–1.2)
Total Protein: 6.1 g/dL — ABNORMAL LOW (ref 6.5–8.1)

## 2020-10-10 LAB — CBC WITH DIFFERENTIAL/PLATELET
Abs Immature Granulocytes: 0.03 10*3/uL (ref 0.00–0.07)
Basophils Absolute: 0 10*3/uL (ref 0.0–0.1)
Basophils Relative: 0 %
Eosinophils Absolute: 0 10*3/uL (ref 0.0–0.5)
Eosinophils Relative: 0 %
HCT: 43.6 % (ref 36.0–46.0)
Hemoglobin: 14.5 g/dL (ref 12.0–15.0)
Immature Granulocytes: 0 %
Lymphocytes Relative: 9 %
Lymphs Abs: 1 10*3/uL (ref 0.7–4.0)
MCH: 30.7 pg (ref 26.0–34.0)
MCHC: 33.3 g/dL (ref 30.0–36.0)
MCV: 92.2 fL (ref 80.0–100.0)
Monocytes Absolute: 0.5 10*3/uL (ref 0.1–1.0)
Monocytes Relative: 5 %
Neutro Abs: 9.3 10*3/uL — ABNORMAL HIGH (ref 1.7–7.7)
Neutrophils Relative %: 86 %
Platelets: 184 10*3/uL (ref 150–400)
RBC: 4.73 MIL/uL (ref 3.87–5.11)
RDW: 13.1 % (ref 11.5–15.5)
WBC: 11 10*3/uL — ABNORMAL HIGH (ref 4.0–10.5)
nRBC: 0 % (ref 0.0–0.2)

## 2020-10-10 LAB — TROPONIN I (HIGH SENSITIVITY)
Troponin I (High Sensitivity): 4 ng/L (ref <18)
Troponin I (High Sensitivity): 4 ng/L (ref <18)

## 2020-10-10 MED ORDER — SODIUM CHLORIDE 0.9 % IV BOLUS
500.0000 mL | Freq: Once | INTRAVENOUS | Status: AC
  Administered 2020-10-10: 500 mL via INTRAVENOUS

## 2020-10-10 NOTE — Discharge Instructions (Addendum)
Make sure you eat 3 meals a day.  And drink plenty of fluids.  Follow-up with her doctor next week.  Return if any problems

## 2020-10-10 NOTE — ED Provider Notes (Signed)
[facility] EMERGENCY DEPARTMENT Provider Note   CSN: [id number] Arrival date & time: 10/10/20  1628     History Chief Complaint  Patient presents with  . Near Syncope    Mindy Mills is a 69 y.o. female.  Patient has syncopal episode today lasted about 1 minute.  Patient had not eaten all day and this was around 4 PM  The history is provided by the patient and medical records.  Near Syncope This is a new problem. The current episode started less than 1 hour ago. The problem occurs rarely. The problem has been resolved. Pertinent negatives include no chest pain, no abdominal pain and no headaches. Nothing aggravates the symptoms. Nothing relieves the symptoms. She has tried nothing for the symptoms.       Past Medical History:  Diagnosis Date  . 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 Full dentures   . Hepatitis C    denies  . Hypertension   . Migraine   . Myocardial infarction ([place])   . Stroke [facility]) 2015   Pt reports hx of CVA x3.  Last one in 2015  . Wears glasses   . Wears hearing aid in both ears     Patient Active Problem List   Diagnosis Date Noted  . Closed fracture of right distal radius 09/07/2018  . Fall 09/08/2015  . UTI (lower urinary tract infection) 09/08/2015  . Dehydration 09/08/2015  . Sepsis ([place]) 09/08/2015  . Systolic heart failure ([place]) 09/08/2015  . Confusion 09/08/2015  . TIA (transient ischemic attack) 09/08/2015  . Acute on chronic respiratory failure ([place]) 09/24/2014  . 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 Demand ischemia of myocardium: in setting of CVA 09/17/2014  . 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 Subarachnoid hemorrhage due to ruptured aneurysm ([place]) 09/11/2014  . Acute respiratory failure ([place]) 09/11/2014  . Hypokalemia 09/11/2014  . Altered mental status 09/11/2014  . Subarachnoid hemorrhage ([place]) 09/11/2014    Past Surgical History:  Procedure  Laterality Date  . ABDOMINAL HYSTERECTOMY    . BUNIONECTOMY    . COLONOSCOPY    . MULTIPLE TOOTH EXTRACTIONS    . OPEN REDUCTION INTERNAL FIXATION (ORIF) DISTAL RADIAL FRACTURE Right 09/07/2018   Procedure: OPEN REDUCTION INTERNAL FIXATION (ORIF) DISTAL RADIAL FRACTURE;  Surgeon: Iran Planas, MD;  Location: [facility];  Service: Orthopedics;  Laterality: Right;  . PEG PLACEMENT N/A 09/21/2014   Procedure: PERCUTANEOUS ENDOSCOPIC GASTROSTOMY (PEG) PLACEMENT (BED SIDE);  Surgeon: Georganna Skeans, MD;  Location: [facility];  Service: General;  Laterality: N/A;  . RADIOLOGY WITH ANESTHESIA N/A 09/11/2014   Procedure: Sub Arachnoid Aneurysm Coiling -RADIOLOGY WITH ANESTHESIA ;  Surgeon: Consuella Lose, MD;  Location: [place];  Service: Radiology;  Laterality: N/A;     OB History   No obstetric history on file.     Family History  Problem Relation Age of Onset  . Alcohol abuse Father     Social History   Tobacco Use  . Smoking status: Former Smoker    Packs/day: 0.50    Types: Cigarettes    Quit date: 09/06/2015    Years since quitting: 5.0  . Smokeless tobacco: Never Used  Vaping Use  . Vaping Use: Former  Substance Use Topics  . Alcohol use: Not Currently  . Drug use: No    Home Medications Prior to Admission medications   Medication Sig Start Date End Date Taking? Authorizing Provider  aspirin 325 MG EC tablet Take 325 mg by mouth daily.    [provider]  cholecalciferol (VITAMIN D) 1000 units tablet Take 1,000 Units by mouth daily.    [provider]  clonazePAM (KLONOPIN) 0.5 MG tablet Take 1 tablet (0.5 mg total) by mouth 2 (two) times daily. Patient not taking: Reported on 09/07/2018 09/11/15   Jonetta Osgood, MD  docusate sodium (COLACE) 100 MG capsule Take 1 capsule (100 mg total) by mouth 2 (two) times daily. 09/09/18   Brynda Peon, PA  EPINEPHrine (EPIPEN) 0.3 mg/0.3 mL IJ SOAJ injection Inject 0.3 mg into the muscle once as needed (for  allergic reaction).    [provider]  feeding supplement, ENSURE ENLIVE, (ENSURE ENLIVE) LIQD Take 237 mLs by mouth daily. 09/11/15   Ghimire, Henreitta Leber, MD  FLUoxetine (PROZAC) 20 MG capsule Take 80 mg by mouth daily.    [provider]  methocarbamol (ROBAXIN) 500 MG tablet Take 1 tablet (500 mg total) by mouth every 6 (six) hours as needed for muscle spasms. 09/09/18   Brynda Peon, PA  oxyCODONE-acetaminophen (PERCOCET) 5-325 MG tablet Take 1-2 tablets by mouth every 6 (six) hours as needed. Patient not taking: Reported on 09/07/2018 08/31/18   Charlesetta Shanks, MD  pantoprazole (PROTONIX) 40 MG tablet Take 1 tablet (40 mg total) by mouth at bedtime. Patient not taking: Reported on 09/07/2018 09/11/15   Jonetta Osgood, MD  rosuvastatin (CRESTOR) 40 MG tablet Take 20 mg by mouth daily.    [provider]  traMADol (ULTRAM) 50 MG tablet Take 100 mg by mouth 4 (four) times daily as needed for moderate pain.    [provider]  traZODone (DESYREL) 100 MG tablet Take 200 mg by mouth at bedtime.     [provider]    Allergies    Bee venom and Capsicum (cayenne) [cayenne]  Review of Systems   Review of Systems  Constitutional: Negative for appetite change and fatigue.  HENT: Negative for congestion, ear discharge and sinus pressure.   Eyes: Negative for discharge.  Respiratory: Negative for cough.   Cardiovascular: Positive for near-syncope. Negative for chest pain.  Gastrointestinal: Negative for abdominal pain and diarrhea.  Genitourinary: Negative for frequency and hematuria.  Musculoskeletal: Negative for back pain.  Skin: Negative for rash.  Neurological: Positive for syncope. Negative for seizures and headaches.  Psychiatric/Behavioral: Negative for hallucinations.    Physical Exam Updated Vital Signs BP (!) 143/91   Pulse 70   Temp 97.9 F (36.6 C) (Oral)   Resp 17   Ht 5\' 3"  (1.6 m)   Wt 48 kg   SpO2 98%   BMI 18.75  kg/m   Physical Exam Vitals and nursing note reviewed.  Constitutional:      Appearance: She is well-developed.  HENT:     Head: Normocephalic.     Nose: Nose normal.  Eyes:     General: No scleral icterus.    Conjunctiva/sclera: Conjunctivae normal.  Neck:     Thyroid: No thyromegaly.  Cardiovascular:     Rate and Rhythm: Normal rate and regular rhythm.     Heart sounds: No murmur heard.  No friction rub. No gallop.   Pulmonary:     Breath sounds: No stridor. No wheezing or rales.  Chest:     Chest wall: No tenderness.  Abdominal:     General: There is no distension.     Tenderness: There is no abdominal tenderness. There is  no rebound.  Musculoskeletal:        General: Normal range of motion.     Cervical back: Neck supple.  Lymphadenopathy:     Cervical: No cervical adenopathy.  Skin:    Findings: No erythema or rash.  Neurological:     Mental Status: She is oriented to person, place, and time.     Motor: No abnormal muscle tone.     Coordination: Coordination normal.  Psychiatric:        Behavior: Behavior normal.     ED Results / Procedures / Treatments   Labs (all labs ordered are listed, but only abnormal results are displayed) Labs Reviewed  COMPREHENSIVE METABOLIC PANEL - Abnormal; Notable for the following components:      Result Value   Glucose, Bld 104 (*)    Creatinine, Ser 1.12 (*)    Calcium 8.6 (*)    Total Protein 6.1 (*)    Albumin 3.1 (*)    GFR, Estimated 50 (*)    All other components within normal limits  CBC WITH DIFFERENTIAL/PLATELET - Abnormal; Notable for the following components:   WBC 11.0 (*)    Neutro Abs 9.3 (*)    All other components within normal limits  CBC WITH DIFFERENTIAL/PLATELET  TROPONIN I (HIGH SENSITIVITY)  TROPONIN I (HIGH SENSITIVITY)    EKG EKG Interpretation  Date/Time:  Thursday October 10 2020 21:53:59 EDT Ventricular Rate:  68 PR Interval:    QRS Duration: 134 QT Interval:  499 QTC  Calculation: 531 R Axis:   84 Text Interpretation: Sinus rhythm Right bundle branch block Confirmed by Milton Ferguson [phone]) on 10/10/2020 10:22:25 PM Also confirmed by Milton Ferguson [phone])  on 10/10/2020 10:26:30 PM   Radiology DG Chest 2 View  Result Date: 10/10/2020 CLINICAL DATA:  Weakness, syncopal episode EXAM: CHEST - 2 VIEW COMPARISON:  Radiograph 08/31/2018 FINDINGS: Suspect a prominent skin fold across the right chest. Appearance less convincing for a pneumothorax. Chronically coarsened interstitial and bronchitic changes are similar to comparison exams. No consolidation, features of edema, pneumothorax, or effusion. The aorta is calcified. The remaining cardiomediastinal contours are unremarkable. No acute osseous or soft tissue abnormality. Remote left clavicular deformity is stable from priors. Degenerative changes are present in the imaged spine and shoulders. IMPRESSION: 1. Suspect a prominent skin fold across the right chest. Appearance less convincing for a pneumothorax. If there is clinical concern, repeat imaging after repositioning could be obtained. 2. No acute cardiopulmonary abnormality. 3. Chronically coarsened interstitial and bronchitic changes. 4.  Aortic Atherosclerosis (ICD10-I70.0). Electronically Signed   By: Lovena Le M.D.   On: 10/10/2020 18:39   CT Head Wo Contrast  Result Date: 10/10/2020 CLINICAL DATA:  Cerebral hemorrhage suspected EXAM: CT HEAD WITHOUT CONTRAST TECHNIQUE: Contiguous axial images were obtained from the base of the skull through the vertex without intravenous contrast. COMPARISON:  CT head 08/31/2018 FINDINGS: Brain: Stable appearance of an anterior aneurysm clip with adjacent streak artifact. Stable region of cortically based hypoattenuation, likely encephalomalacia from prior infarct along the high right anterior parietal lobe. Remote lacunar infarct seen in the left basal ganglia. No evidence of acute infarction, hemorrhage, hydrocephalus,  extra-axial collection, visible mass lesion or mass effect. Symmetric prominence of the ventricles, cisterns and sulci compatible with parenchymal volume loss. Patchy areas of white matter hypoattenuation are most compatible with chronic microvascular angiopathy. Vascular: Anterior aneurysm clipping is again seen. Atherosclerotic calcification of the carotid siphons and intradural vertebral arteries. No hyperdense vessel. Skull: No calvarial fracture  or suspicious osseous lesion. No scalp swelling or hematoma. Sinuses/Orbits: Paranasal sinuses and mastoid air cells are predominantly clear. Included orbital structures are unremarkable. Other: None. IMPRESSION: 1. No acute intracranial abnormality. 2. Prior anterior aneurysm clipping. 3. Stable encephalomalacia from prior infarct in the high right anterior parietal lobe. Remote lacunar infarct in the left basal ganglia. 4. Stable parenchymal volume loss and chronic microvascular angiopathy. Electronically Signed   By: Lovena Le M.D.   On: 10/10/2020 18:43    Procedures Procedures (including critical care time)  Medications Ordered in ED Medications  sodium chloride 0.9 % bolus 500 mL (0 mLs Intravenous Stopped 10/10/20 2019)    ED Course  I have reviewed the triage vital signs and the nursing notes.  Pertinent labs & imaging results that were available during my care of the patient were reviewed by me and considered in my medical decision making (see chart for details).    MDM Rules/Calculators/A&P                          Labs x-rays unremarkable.  Patient with syncopal episode and not eating all day.  She was instructed to eat 3 meals a day and follow-up with her PCP and return if problems     This patient presents to the ED for concern of syncope, this involves an extensive number of treatment options, and is a complaint that carries with it a high risk of complications and morbidity.  The differential diagnosis includes dehydration  cardiac:   Lab Tests:   I Ordered, reviewed, and interpreted labs, which included CBC and chemistries showed mild elevated white count  Medicines ordered:   Normal saline for dehydration  Imaging Studies ordered:   I ordered imaging studies which included CT head  I independently visualized and interpreted imaging which showed no acute disease  Additional history obtained:   Additional history obtained from record  Previous records obtained and reviewed.  Consultations Obtained:     Reevaluation:  After the interventions stated above, I reevaluated the patient and found improved  Critical Interventions:  .   Final Clinical Impression(s) / ED Diagnoses Final diagnoses:  Syncope and collapse    Rx / DC Orders ED Discharge Orders    None       Milton Ferguson, MD 10/11/20 1136

## 2020-10-10 NOTE — ED Triage Notes (Signed)
Pt arrived via EMS has a syncopal episode at home.  Per bystanders pt went out for 1 minute.  Pt alert and oriented at this time.

## 2020-12-07 ENCOUNTER — Emergency Department (HOSPITAL_COMMUNITY): Payer: Medicare Other

## 2020-12-07 ENCOUNTER — Other Ambulatory Visit: Payer: Self-pay

## 2020-12-07 ENCOUNTER — Inpatient Hospital Stay (HOSPITAL_COMMUNITY)
Admission: EM | Admit: 2020-12-07 | Discharge: 2020-12-24 | DRG: 177 | Disposition: A | Discharge status: Skilled Nursing Facility | Payer: Medicare Other | Attending: Internal Medicine | Admitting: Internal Medicine

## 2020-12-07 ENCOUNTER — Encounter (HOSPITAL_COMMUNITY): Payer: Self-pay | Admitting: Emergency Medicine

## 2020-12-07 DIAGNOSIS — R059 Cough, unspecified: Secondary | ICD-10-CM | POA: Diagnosis not present

## 2020-12-07 DIAGNOSIS — Z7401 Bed confinement status: Secondary | ICD-10-CM | POA: Diagnosis not present

## 2020-12-07 DIAGNOSIS — N179 Acute kidney failure, unspecified: Secondary | ICD-10-CM | POA: Diagnosis present

## 2020-12-07 DIAGNOSIS — Z91030 Bee allergy status: Secondary | ICD-10-CM

## 2020-12-07 DIAGNOSIS — E43 Unspecified severe protein-calorie malnutrition: Secondary | ICD-10-CM | POA: Insufficient documentation

## 2020-12-07 DIAGNOSIS — Z743 Need for continuous supervision: Secondary | ICD-10-CM | POA: Diagnosis not present

## 2020-12-07 DIAGNOSIS — R778 Other specified abnormalities of plasma proteins: Secondary | ICD-10-CM | POA: Diagnosis present

## 2020-12-07 DIAGNOSIS — E785 Hyperlipidemia, unspecified: Secondary | ICD-10-CM | POA: Diagnosis present

## 2020-12-07 DIAGNOSIS — I513 Intracardiac thrombosis, not elsewhere classified: Secondary | ICD-10-CM | POA: Diagnosis not present

## 2020-12-07 DIAGNOSIS — Z79899 Other long term (current) drug therapy: Secondary | ICD-10-CM

## 2020-12-07 DIAGNOSIS — I340 Nonrheumatic mitral (valve) insufficiency: Secondary | ICD-10-CM | POA: Diagnosis not present

## 2020-12-07 DIAGNOSIS — I5043 Acute on chronic combined systolic (congestive) and diastolic (congestive) heart failure: Secondary | ICD-10-CM | POA: Diagnosis present

## 2020-12-07 DIAGNOSIS — R2689 Other abnormalities of gait and mobility: Secondary | ICD-10-CM | POA: Diagnosis not present

## 2020-12-07 DIAGNOSIS — J189 Pneumonia, unspecified organism: Secondary | ICD-10-CM

## 2020-12-07 DIAGNOSIS — I6602 Occlusion and stenosis of left middle cerebral artery: Secondary | ICD-10-CM | POA: Diagnosis not present

## 2020-12-07 DIAGNOSIS — I6389 Other cerebral infarction: Secondary | ICD-10-CM | POA: Diagnosis not present

## 2020-12-07 DIAGNOSIS — J9601 Acute respiratory failure with hypoxia: Secondary | ICD-10-CM | POA: Diagnosis not present

## 2020-12-07 DIAGNOSIS — R51 Headache with orthostatic component, not elsewhere classified: Secondary | ICD-10-CM | POA: Diagnosis not present

## 2020-12-07 DIAGNOSIS — I609 Nontraumatic subarachnoid hemorrhage, unspecified: Secondary | ICD-10-CM | POA: Diagnosis not present

## 2020-12-07 DIAGNOSIS — R2681 Unsteadiness on feet: Secondary | ICD-10-CM | POA: Diagnosis not present

## 2020-12-07 DIAGNOSIS — I700 Atherosclerosis of aorta: Secondary | ICD-10-CM | POA: Diagnosis not present

## 2020-12-07 DIAGNOSIS — N1831 Chronic kidney disease, stage 3a: Secondary | ICD-10-CM | POA: Diagnosis present

## 2020-12-07 DIAGNOSIS — Z888 Allergy status to other drugs, medicaments and biological substances status: Secondary | ICD-10-CM

## 2020-12-07 DIAGNOSIS — I5021 Acute systolic (congestive) heart failure: Secondary | ICD-10-CM | POA: Diagnosis not present

## 2020-12-07 DIAGNOSIS — I69320 Aphasia following cerebral infarction: Secondary | ICD-10-CM | POA: Diagnosis not present

## 2020-12-07 DIAGNOSIS — R079 Chest pain, unspecified: Secondary | ICD-10-CM | POA: Diagnosis not present

## 2020-12-07 DIAGNOSIS — J449 Chronic obstructive pulmonary disease, unspecified: Secondary | ICD-10-CM | POA: Diagnosis not present

## 2020-12-07 DIAGNOSIS — I130 Hypertensive heart and chronic kidney disease with heart failure and stage 1 through stage 4 chronic kidney disease, or unspecified chronic kidney disease: Secondary | ICD-10-CM | POA: Diagnosis present

## 2020-12-07 DIAGNOSIS — I2510 Atherosclerotic heart disease of native coronary artery without angina pectoris: Secondary | ICD-10-CM | POA: Diagnosis present

## 2020-12-07 DIAGNOSIS — Z681 Body mass index (BMI) 19 or less, adult: Secondary | ICD-10-CM | POA: Diagnosis not present

## 2020-12-07 DIAGNOSIS — Z7982 Long term (current) use of aspirin: Secondary | ICD-10-CM

## 2020-12-07 DIAGNOSIS — R7989 Other specified abnormal findings of blood chemistry: Secondary | ICD-10-CM | POA: Diagnosis present

## 2020-12-07 DIAGNOSIS — Z23 Encounter for immunization: Secondary | ICD-10-CM

## 2020-12-07 DIAGNOSIS — Z90710 Acquired absence of both cervix and uterus: Secondary | ICD-10-CM

## 2020-12-07 DIAGNOSIS — I361 Nonrheumatic tricuspid (valve) insufficiency: Secondary | ICD-10-CM | POA: Diagnosis not present

## 2020-12-07 DIAGNOSIS — G8191 Hemiplegia, unspecified affecting right dominant side: Secondary | ICD-10-CM | POA: Diagnosis not present

## 2020-12-07 DIAGNOSIS — I63412 Cerebral infarction due to embolism of left middle cerebral artery: Secondary | ICD-10-CM | POA: Diagnosis not present

## 2020-12-07 DIAGNOSIS — J439 Emphysema, unspecified: Secondary | ICD-10-CM | POA: Diagnosis present

## 2020-12-07 DIAGNOSIS — Z974 Presence of external hearing-aid: Secondary | ICD-10-CM

## 2020-12-07 DIAGNOSIS — R4182 Altered mental status, unspecified: Secondary | ICD-10-CM | POA: Diagnosis present

## 2020-12-07 DIAGNOSIS — I671 Cerebral aneurysm, nonruptured: Secondary | ICD-10-CM | POA: Diagnosis not present

## 2020-12-07 DIAGNOSIS — I248 Other forms of acute ischemic heart disease: Secondary | ICD-10-CM | POA: Diagnosis present

## 2020-12-07 DIAGNOSIS — Z20822 Contact with and (suspected) exposure to covid-19: Secondary | ICD-10-CM | POA: Diagnosis present

## 2020-12-07 DIAGNOSIS — I69321 Dysphasia following cerebral infarction: Secondary | ICD-10-CM | POA: Diagnosis not present

## 2020-12-07 DIAGNOSIS — J690 Pneumonitis due to inhalation of food and vomit: Secondary | ICD-10-CM | POA: Diagnosis not present

## 2020-12-07 DIAGNOSIS — R54 Age-related physical debility: Secondary | ICD-10-CM | POA: Diagnosis not present

## 2020-12-07 DIAGNOSIS — R1313 Dysphagia, pharyngeal phase: Secondary | ICD-10-CM | POA: Diagnosis not present

## 2020-12-07 DIAGNOSIS — R29715 NIHSS score 15: Secondary | ICD-10-CM | POA: Diagnosis not present

## 2020-12-07 DIAGNOSIS — R471 Dysarthria and anarthria: Secondary | ICD-10-CM | POA: Diagnosis not present

## 2020-12-07 DIAGNOSIS — Z66 Do not resuscitate: Secondary | ICD-10-CM | POA: Diagnosis not present

## 2020-12-07 DIAGNOSIS — I639 Cerebral infarction, unspecified: Secondary | ICD-10-CM

## 2020-12-07 DIAGNOSIS — I6930 Unspecified sequelae of cerebral infarction: Secondary | ICD-10-CM | POA: Diagnosis not present

## 2020-12-07 DIAGNOSIS — R4702 Dysphasia: Secondary | ICD-10-CM | POA: Diagnosis not present

## 2020-12-07 DIAGNOSIS — G9349 Other encephalopathy: Secondary | ICD-10-CM | POA: Diagnosis not present

## 2020-12-07 DIAGNOSIS — M2550 Pain in unspecified joint: Secondary | ICD-10-CM | POA: Diagnosis not present

## 2020-12-07 DIAGNOSIS — I728 Aneurysm of other specified arteries: Secondary | ICD-10-CM | POA: Diagnosis not present

## 2020-12-07 DIAGNOSIS — I63512 Cerebral infarction due to unspecified occlusion or stenosis of left middle cerebral artery: Secondary | ICD-10-CM | POA: Diagnosis not present

## 2020-12-07 DIAGNOSIS — Z8541 Personal history of malignant neoplasm of cervix uteri: Secondary | ICD-10-CM

## 2020-12-07 DIAGNOSIS — R569 Unspecified convulsions: Secondary | ICD-10-CM | POA: Diagnosis not present

## 2020-12-07 DIAGNOSIS — I252 Old myocardial infarction: Secondary | ICD-10-CM

## 2020-12-07 DIAGNOSIS — E876 Hypokalemia: Secondary | ICD-10-CM | POA: Diagnosis not present

## 2020-12-07 DIAGNOSIS — J9600 Acute respiratory failure, unspecified whether with hypoxia or hypercapnia: Secondary | ICD-10-CM | POA: Diagnosis present

## 2020-12-07 DIAGNOSIS — R0602 Shortness of breath: Secondary | ICD-10-CM | POA: Diagnosis not present

## 2020-12-07 DIAGNOSIS — Z8673 Personal history of transient ischemic attack (TIA), and cerebral infarction without residual deficits: Secondary | ICD-10-CM

## 2020-12-07 DIAGNOSIS — J80 Acute respiratory distress syndrome: Secondary | ICD-10-CM | POA: Diagnosis not present

## 2020-12-07 DIAGNOSIS — R062 Wheezing: Secondary | ICD-10-CM | POA: Diagnosis not present

## 2020-12-07 DIAGNOSIS — Z79891 Long term (current) use of opiate analgesic: Secondary | ICD-10-CM

## 2020-12-07 DIAGNOSIS — Z87891 Personal history of nicotine dependence: Secondary | ICD-10-CM

## 2020-12-07 DIAGNOSIS — M6281 Muscle weakness (generalized): Secondary | ICD-10-CM | POA: Diagnosis not present

## 2020-12-07 DIAGNOSIS — R6889 Other general symptoms and signs: Secondary | ICD-10-CM | POA: Diagnosis not present

## 2020-12-07 DIAGNOSIS — R0689 Other abnormalities of breathing: Secondary | ICD-10-CM | POA: Diagnosis not present

## 2020-12-07 LAB — COMPREHENSIVE METABOLIC PANEL
ALT: 95 U/L — ABNORMAL HIGH (ref 0–44)
AST: 66 U/L — ABNORMAL HIGH (ref 15–41)
Albumin: 3.5 g/dL (ref 3.5–5.0)
Alkaline Phosphatase: 72 U/L (ref 38–126)
Anion gap: 13 (ref 5–15)
BUN: 44 mg/dL — ABNORMAL HIGH (ref 8–23)
CO2: 25 mmol/L (ref 22–32)
Calcium: 9.2 mg/dL (ref 8.9–10.3)
Chloride: 106 mmol/L (ref 98–111)
Creatinine, Ser: 1.22 mg/dL — ABNORMAL HIGH (ref 0.44–1.00)
GFR, Estimated: 48 mL/min — ABNORMAL LOW (ref >60)
Glucose, Bld: 118 mg/dL — ABNORMAL HIGH (ref 70–99)
Potassium: 4 mmol/L (ref 3.5–5.1)
Sodium: 144 mmol/L (ref 135–145)
Total Bilirubin: 0.7 mg/dL (ref 0.3–1.2)
Total Protein: 8 g/dL (ref 6.5–8.1)

## 2020-12-07 LAB — D-DIMER, QUANTITATIVE: D-Dimer, Quant: 5 ug/mL-FEU — ABNORMAL HIGH (ref 0.00–0.50)

## 2020-12-07 LAB — CBC WITH DIFFERENTIAL/PLATELET
Abs Immature Granulocytes: 0.1 10*3/uL — ABNORMAL HIGH (ref 0.00–0.07)
Basophils Absolute: 0.1 10*3/uL (ref 0.0–0.1)
Basophils Relative: 0 %
Eosinophils Absolute: 0 10*3/uL (ref 0.0–0.5)
Eosinophils Relative: 0 %
HCT: 53.1 % — ABNORMAL HIGH (ref 36.0–46.0)
Hemoglobin: 16.8 g/dL — ABNORMAL HIGH (ref 12.0–15.0)
Immature Granulocytes: 1 %
Lymphocytes Relative: 12 %
Lymphs Abs: 1.7 10*3/uL (ref 0.7–4.0)
MCH: 30.4 pg (ref 26.0–34.0)
MCHC: 31.6 g/dL (ref 30.0–36.0)
MCV: 96 fL (ref 80.0–100.0)
Monocytes Absolute: 1 10*3/uL (ref 0.1–1.0)
Monocytes Relative: 6 %
Neutro Abs: 12.1 10*3/uL — ABNORMAL HIGH (ref 1.7–7.7)
Neutrophils Relative %: 81 %
Platelets: 319 10*3/uL (ref 150–400)
RBC: 5.53 MIL/uL — ABNORMAL HIGH (ref 3.87–5.11)
RDW: 14.6 % (ref 11.5–15.5)
WBC: 14.9 10*3/uL — ABNORMAL HIGH (ref 4.0–10.5)
nRBC: 0 % (ref 0.0–0.2)

## 2020-12-07 LAB — FERRITIN: Ferritin: 85 ng/mL (ref 11–307)

## 2020-12-07 LAB — CK: Total CK: 348 U/L — ABNORMAL HIGH (ref 38–234)

## 2020-12-07 LAB — LACTIC ACID, PLASMA
Lactic Acid, Venous: 1.8 mmol/L (ref 0.5–1.9)
Lactic Acid, Venous: 1.5 mmol/L (ref 0.5–1.9)

## 2020-12-07 LAB — PROCALCITONIN: Procalcitonin: 0.48 ng/mL

## 2020-12-07 LAB — TROPONIN I (HIGH SENSITIVITY)
Troponin I (High Sensitivity): 333 ng/L (ref <18)
Troponin I (High Sensitivity): 338 ng/L (ref <18)

## 2020-12-07 LAB — RESP PANEL BY RT-PCR (FLU A&B, COVID) ARPGX2
Influenza A by PCR: NEGATIVE
Influenza B by PCR: NEGATIVE
SARS Coronavirus 2 by RT PCR: NEGATIVE

## 2020-12-07 LAB — FIBRINOGEN: Fibrinogen: 626 mg/dL — ABNORMAL HIGH (ref 210–475)

## 2020-12-07 LAB — C-REACTIVE PROTEIN: CRP: 14.5 mg/dL — ABNORMAL HIGH (ref <1.0)

## 2020-12-07 LAB — LACTATE DEHYDROGENASE: LDH: 260 U/L — ABNORMAL HIGH (ref 98–192)

## 2020-12-07 LAB — TRIGLYCERIDES: Triglycerides: 193 mg/dL — ABNORMAL HIGH (ref <150)

## 2020-12-07 MED ORDER — ALBUTEROL SULFATE (2.5 MG/3ML) 0.083% IN NEBU: 2.5000 mg | INHALATION_SOLUTION | RESPIRATORY_TRACT | Status: DC | PRN

## 2020-12-07 MED ORDER — IPRATROPIUM-ALBUTEROL 0.5-2.5 (3) MG/3ML IN SOLN
3.0000 mL | Freq: Four times a day (QID) | RESPIRATORY_TRACT | Status: DC
  Administered 2020-12-07 – 2020-12-09 (×9): 3 mL via RESPIRATORY_TRACT
  Filled 2020-12-07 (×9): qty 3

## 2020-12-07 MED ORDER — SODIUM CHLORIDE 0.9 % IV SOLN: Freq: Once | INTRAVENOUS | Status: AC

## 2020-12-07 MED ORDER — IOHEXOL 350 MG/ML SOLN
75.0000 mL | Freq: Once | INTRAVENOUS | Status: AC | PRN
  Administered 2020-12-07: 75 mL via INTRAVENOUS

## 2020-12-07 MED ORDER — ENOXAPARIN SODIUM 40 MG/0.4ML ~~LOC~~ SOLN
40.0000 mg | SUBCUTANEOUS | Status: DC
  Administered 2020-12-07 – 2020-12-09 (×3): 40 mg via SUBCUTANEOUS
  Filled 2020-12-07 (×3): qty 0.4

## 2020-12-07 MED ORDER — SODIUM CHLORIDE 0.9 % IV SOLN
500.0000 mg | Freq: Once | INTRAVENOUS | Status: AC
  Administered 2020-12-07: 15:00:00 500 mg via INTRAVENOUS
  Filled 2020-12-07: qty 500

## 2020-12-07 MED ORDER — SODIUM CHLORIDE 0.9 % IV SOLN
1.0000 g | Freq: Once | INTRAVENOUS | Status: AC
  Administered 2020-12-07: 14:00:00 1 g via INTRAVENOUS
  Filled 2020-12-07: qty 10

## 2020-12-07 MED ORDER — BUDESONIDE 0.25 MG/2ML IN SUSP
0.2500 mg | Freq: Two times a day (BID) | RESPIRATORY_TRACT | Status: DC
  Administered 2020-12-07 – 2020-12-11 (×8): 0.25 mg via RESPIRATORY_TRACT
  Filled 2020-12-07 (×8): qty 2

## 2020-12-07 MED ORDER — SODIUM CHLORIDE 0.9 % IV BOLUS
500.0000 mL | Freq: Once | INTRAVENOUS | Status: AC
  Administered 2020-12-07: 12:00:00 500 mL via INTRAVENOUS

## 2020-12-07 MED ORDER — FLUOXETINE HCL 20 MG PO CAPS
80.0000 mg | ORAL_CAPSULE | Freq: Every day | ORAL | Status: DC
  Administered 2020-12-07 – 2020-12-24 (×17): 80 mg via ORAL
  Filled 2020-12-07 (×19): qty 4

## 2020-12-07 MED ORDER — ALBUTEROL SULFATE HFA 108 (90 BASE) MCG/ACT IN AERS
4.0000 | INHALATION_SPRAY | Freq: Once | RESPIRATORY_TRACT | Status: AC
  Administered 2020-12-07: 13:00:00 4 via RESPIRATORY_TRACT
  Filled 2020-12-07: qty 6.7

## 2020-12-07 MED ORDER — ASPIRIN EC 325 MG PO TBEC
325.0000 mg | DELAYED_RELEASE_TABLET | Freq: Every day | ORAL | Status: DC
  Administered 2020-12-07 – 2020-12-12 (×6): 325 mg via ORAL
  Filled 2020-12-07 (×7): qty 1

## 2020-12-07 MED ORDER — LACTATED RINGERS IV SOLN: INTRAVENOUS | Status: DC

## 2020-12-07 MED ORDER — GUAIFENESIN ER 600 MG PO TB12
600.0000 mg | ORAL_TABLET | Freq: Two times a day (BID) | ORAL | Status: DC
  Administered 2020-12-07 – 2020-12-24 (×32): 600 mg via ORAL
  Filled 2020-12-07 (×33): qty 1

## 2020-12-07 MED ORDER — SODIUM CHLORIDE 0.9 % IV SOLN
3.0000 g | Freq: Four times a day (QID) | INTRAVENOUS | Status: DC
  Administered 2020-12-08 – 2020-12-10 (×12): 3 g via INTRAVENOUS
  Filled 2020-12-07 (×2): qty 8
  Filled 2020-12-07: qty 3
  Filled 2020-12-07 (×2): qty 8
  Filled 2020-12-07: qty 3
  Filled 2020-12-07 (×10): qty 8
  Filled 2020-12-07: qty 3
  Filled 2020-12-07: qty 8

## 2020-12-07 MED ORDER — TRAZODONE HCL 50 MG PO TABS
200.0000 mg | ORAL_TABLET | Freq: Every day | ORAL | Status: DC
  Administered 2020-12-07 – 2020-12-12 (×6): 200 mg via ORAL
  Filled 2020-12-07 (×7): qty 4

## 2020-12-07 NOTE — ED Triage Notes (Signed)
EMS called out for stroke like symptoms. Ems states no stroke symptoms noted on arrival. Pt sats were 88% on RA. Productive cough noted on arrival. No stroke symptoms noticed on arrival. Pt has hx of previous stroke.

## 2020-12-07 NOTE — ED Provider Notes (Signed)
[facility] EMERGENCY DEPARTMENT Provider Note   CSN: [id number] Arrival date & time: 12/07/20  2637     History Chief Complaint  Patient presents with  . Shortness of Breath    Mindy Mills is a 69 y.o. female.  She is brought in by EMS from home.  They were called for possible strokelike symptoms.  Found her to be 88% on room air.  Nonfocal exam.  I asked the patient why she thinks she had a stroke.  She said she was so weak that she could not walk.  Not weak on one side of the body just generally weak.  Also complains of cough productive of green sputum, shortness of breath, body aches.  Has not been Covid vaccinated.  The history is provided by the patient and the EMS personnel.  Shortness of Breath Severity:  Moderate Onset quality:  Gradual Duration:  3 days Timing:  Constant Progression:  Worsening Chronicity:  New Relieved by:  None tried Worsened by:  Nothing Ineffective treatments:  None tried Associated symptoms: cough, headaches and sputum production   Associated symptoms: no abdominal pain, no chest pain, no fever, no rash, no sore throat, no syncope and no vomiting   Risk factors: no tobacco use        Past Medical History:  Diagnosis Date  . Cancer ([place])    Cervicle  . Full dentures   . Hepatitis C    denies  . Hypertension   . Migraine   . Myocardial infarction ([place])   . Stroke [facility]) 2015   Pt reports hx of CVA x3.  Last one in 2015  . Wears glasses   . Wears hearing aid in both ears     Patient Active Problem List   Diagnosis Date Noted  . Closed fracture of right distal radius 09/07/2018  . Fall 09/08/2015  . UTI (lower urinary tract infection) 09/08/2015  . Dehydration 09/08/2015  . Sepsis ([place]) 09/08/2015  . Systolic heart failure ([place]) 09/08/2015  . Confusion 09/08/2015  . TIA (transient ischemic attack) 09/08/2015  . Acute on chronic respiratory failure ([place]) 09/24/2014  . 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 Demand ischemia  of myocardium: in setting of CVA 09/17/2014  . 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 Acute respiratory failure ([place]) 09/11/2014  . Hypokalemia 09/11/2014  . Altered mental status 09/11/2014  . Subarachnoid hemorrhage ([place]) 09/11/2014    Past Surgical History:  Procedure Laterality Date  . ABDOMINAL HYSTERECTOMY    . BUNIONECTOMY    . COLONOSCOPY    . MULTIPLE TOOTH EXTRACTIONS    . OPEN REDUCTION INTERNAL FIXATION (ORIF) DISTAL RADIAL FRACTURE Right 09/07/2018   Procedure: OPEN REDUCTION INTERNAL FIXATION (ORIF) DISTAL RADIAL FRACTURE;  Surgeon: Iran Planas, MD;  Location: [place];  Service: Orthopedics;  Laterality: Right;  . PEG PLACEMENT N/A 09/21/2014   Procedure: PERCUTANEOUS ENDOSCOPIC GASTROSTOMY (PEG) PLACEMENT (BED SIDE);  Surgeon: Georganna Skeans, MD;  Location: [place];  Service: General;  Laterality: N/A;  . RADIOLOGY WITH ANESTHESIA N/A 09/11/2014   Procedure: Sub Arachnoid Aneurysm Coiling -RADIOLOGY WITH ANESTHESIA ;  Surgeon: Consuella Lose, MD;  Location: [place];  Service: Radiology;  Laterality: N/A;     OB History   No obstetric history on file.     Family History  Problem Relation Age of Onset  . Alcohol abuse Father  Social History   Tobacco Use  . Smoking status: Former Smoker    Packs/day: 0.50    Types: Cigarettes    Quit date: 09/06/2015    Years since quitting: 5.2  . Smokeless tobacco: Never Used  Vaping Use  . Vaping Use: Former  Substance Use Topics  . Alcohol use: Not Currently  . Drug use: No    Home Medications Prior to Admission medications   Medication Sig Start Date End Date Taking? Authorizing Provider  aspirin 325 MG EC tablet Take 325 mg by mouth daily.    [provider]  cholecalciferol (VITAMIN D) 1000 units tablet Take 1,000 Units by mouth daily.    [provider]   clonazePAM (KLONOPIN) 0.5 MG tablet Take 1 tablet (0.5 mg total) by mouth 2 (two) times daily. Patient not taking: Reported on 09/07/2018 09/11/15   Jonetta Osgood, MD  docusate sodium (COLACE) 100 MG capsule Take 1 capsule (100 mg total) by mouth 2 (two) times daily. 09/09/18   Brynda Peon, PA  EPINEPHrine (EPIPEN) 0.3 mg/0.3 mL IJ SOAJ injection Inject 0.3 mg into the muscle once as needed (for allergic reaction).    [provider]  feeding supplement, ENSURE ENLIVE, (ENSURE ENLIVE) LIQD Take 237 mLs by mouth daily. 09/11/15   Ghimire, Henreitta Leber, MD  FLUoxetine (PROZAC) 20 MG capsule Take 80 mg by mouth daily.    [provider]  methocarbamol (ROBAXIN) 500 MG tablet Take 1 tablet (500 mg total) by mouth every 6 (six) hours as needed for muscle spasms. 09/09/18   Brynda Peon, PA  oxyCODONE-acetaminophen (PERCOCET) 5-325 MG tablet Take 1-2 tablets by mouth every 6 (six) hours as needed. Patient not taking: Reported on 09/07/2018 08/31/18   Charlesetta Shanks, MD  pantoprazole (PROTONIX) 40 MG tablet Take 1 tablet (40 mg total) by mouth at bedtime. Patient not taking: Reported on 09/07/2018 09/11/15   Jonetta Osgood, MD  rosuvastatin (CRESTOR) 40 MG tablet Take 20 mg by mouth daily.    [provider]  traMADol (ULTRAM) 50 MG tablet Take 100 mg by mouth 4 (four) times daily as needed for moderate pain.    [provider]  traZODone (DESYREL) 100 MG tablet Take 200 mg by mouth at bedtime.     [provider]    Allergies    Bee venom and Capsicum (cayenne) [cayenne]  Review of Systems   Review of Systems  Constitutional: Positive for appetite change and fatigue. Negative for fever.  HENT: Negative for sore throat.   Eyes: Negative for visual disturbance.  Respiratory: Positive for cough, sputum production and shortness of breath.   Cardiovascular: Negative for chest pain and syncope.  Gastrointestinal: Negative for abdominal  pain and vomiting.  Genitourinary: Negative for dysuria.  Musculoskeletal: Positive for myalgias.  Skin: Negative for rash.  Neurological: Positive for weakness (general) and headaches.    Physical Exam Updated Vital Signs BP 113/80 (BP Location: Left Arm)   Pulse 92   Temp 99.1 F (37.3 C) (Oral)   Resp (!) 22   Ht 5\' 3"  (1.6 m)   Wt 47.2 kg   SpO2 93%   BMI 18.42 kg/m   Physical Exam Vitals and nursing note reviewed.  Constitutional:      General: She is not in acute distress.    Appearance: She is underweight and well-nourished.  HENT:     Head: Normocephalic and atraumatic.  Eyes:     Conjunctiva/sclera: Conjunctivae normal.  Cardiovascular:  Rate and Rhythm: Normal rate and regular rhythm.     Heart sounds: No murmur heard.   Pulmonary:     Effort: Tachypnea and accessory muscle usage present. No respiratory distress.     Breath sounds: Rhonchi present.  Abdominal:     Palpations: Abdomen is soft.     Tenderness: There is no abdominal tenderness.  Musculoskeletal:        General: No deformity, signs of injury or edema. Normal range of motion.     Cervical back: Neck supple.     Right lower leg: No edema.     Left lower leg: No edema.  Skin:    General: Skin is warm and dry.     Capillary Refill: Capillary refill takes less than 2 seconds.  Neurological:     General: No focal deficit present.     Mental Status: She is alert.     Cranial Nerves: No cranial nerve deficit.     Sensory: No sensory deficit.     Motor: No weakness.  Psychiatric:        Mood and Affect: Mood and affect normal.     ED Results / Procedures / Treatments   Labs (all labs ordered are listed, but only abnormal results are displayed) Labs Reviewed  CBC WITH DIFFERENTIAL/PLATELET - Abnormal; Notable for the following components:      Result Value   WBC 14.9 (*)    RBC 5.53 (*)    Hemoglobin 16.8 (*)    HCT 53.1 (*)    Neutro Abs 12.1 (*)    Abs Immature Granulocytes 0.10  (*)    All other components within normal limits  COMPREHENSIVE METABOLIC PANEL - Abnormal; Notable for the following components:   Glucose, Bld 118 (*)    BUN 44 (*)    Creatinine, Ser 1.22 (*)    AST 66 (*)    ALT 95 (*)    GFR, Estimated 48 (*)    All other components within normal limits  D-DIMER, QUANTITATIVE (NOT AT [facility]) - Abnormal; Notable for the following components:   D-Dimer, Quant 5.00 (*)    All other components within normal limits  LACTATE DEHYDROGENASE - Abnormal; Notable for the following components:   LDH 260 (*)    All other components within normal limits  TRIGLYCERIDES - Abnormal; Notable for the following components:   Triglycerides 193 (*)    All other components within normal limits  FIBRINOGEN - Abnormal; Notable for the following components:   Fibrinogen 626 (*)    All other components within normal limits  C-REACTIVE PROTEIN - Abnormal; Notable for the following components:   CRP 14.5 (*)    All other components within normal limits  CK - Abnormal; Notable for the following components:   Total CK 348 (*)    All other components within normal limits  TROPONIN I (HIGH SENSITIVITY) - Abnormal; Notable for the following components:   Troponin I (High Sensitivity) 333 (*)    All other components within normal limits  TROPONIN I (HIGH SENSITIVITY) - Abnormal; Notable for the following components:   Troponin I (High Sensitivity) 338 (*)    All other components within normal limits  RESP PANEL BY RT-PCR (FLU A&B, COVID) ARPGX2  CULTURE, BLOOD (ROUTINE X 2)  CULTURE, BLOOD (ROUTINE X 2)  URINE CULTURE  LACTIC ACID, PLASMA  LACTIC ACID, PLASMA  PROCALCITONIN  FERRITIN  HIV ANTIBODY (ROUTINE TESTING W REFLEX)  COMPREHENSIVE METABOLIC PANEL  CBC  EKG EKG Interpretation  Date/Time:  Saturday December 07 2020 10:15:33 EST Ventricular Rate:  92 PR Interval:    QRS Duration: 133 QT Interval:  434 QTC Calculation: 537 R Axis:   92 Text  Interpretation: Sinus rhythm Consider right atrial enlargement RBBB and LPFB Nonspecific T abnormalities, lateral leads no charge from prior today Confirmed by Aletta Edouard [phone]) on 12/07/2020 12:10:27 PM   Radiology CT Angio Chest PE W/Cm &/Or Wo Cm  Result Date: 12/07/2020 CLINICAL DATA:  Chest pain for several days and elevated D-dimer. EXAM: CT ANGIOGRAPHY CHEST WITH CONTRAST TECHNIQUE: Multidetector CT imaging of the chest was performed using the standard protocol during bolus administration of intravenous contrast. Multiplanar CT image reconstructions and MIPs were obtained to evaluate the vascular anatomy. CONTRAST:  6mL OMNIPAQUE IOHEXOL 350 MG/ML SOLN COMPARISON:  None. FINDINGS: Cardiovascular: The heart is borderline enlarged for age. No pericardial effusion. Significant age advanced atherosclerotic calcifications involving the thoracic aorta and branch vessels. Three-vessel coronary artery calcifications are noted. The pulmonary arterial tree is well opacified. No filling defects to suggest pulmonary embolism. Mediastinum/Nodes: Borderline right hilar lymph nodes likely reactive/inflammatory. No mediastinal adenopathy. There is a large hiatal hernia. Lungs/Pleura: Severe emphysematous changes and pulmonary scarring. Extensive fluid and debris filling the right lower lobe bronchus and its branches highly suggestive acute aspiration. There is also mild peribronchial thickening and peribronchial inflammatory changes. Right lower lobe atelectasis is noted adjacent to the hiatal hernia. No pleural effusions. No worrisome pulmonary lesions. Upper Abdomen: Chronic appearing left-sided hydronephrosis with fairly significant renal cortical thinning. Aortic and branch vessel calcifications are noted. Low-attenuation left adrenal gland lesion, likely benign adenoma. Musculoskeletal: No breast masses, supraclavicular or axillary adenopathy. The bony thorax is intact. No worrisome bone lesions or acute  fractures. Review of the MIP images confirms the above findings. IMPRESSION: 1. No CT findings for pulmonary embolism. 2. Significant age advanced atherosclerotic calcifications involving the thoracic aorta and branch vessels including the coronary arteries. 3. Severe emphysematous changes and pulmonary scarring. 4. Extensive fluid and debris filling the right lower lobe bronchus and its branches highly suggestive of acute aspiration. There is also mild peribronchial thickening and peribronchial inflammatory changes. 5. Large hiatal hernia. 6. Chronic appearing left-sided hydronephrosis with fairly significant renal cortical thinning. 7. Emphysema and aortic atherosclerosis. Aortic Atherosclerosis (ICD10-I70.0) and Emphysema (ICD10-J43.9). Electronically Signed   By: Marijo Sanes M.D.   On: 12/07/2020 12:33   DG Chest Port 1 View  Result Date: 12/07/2020 CLINICAL DATA:  cough EXAM: PORTABLE CHEST 1 VIEW COMPARISON:  10/10/2020 and prior. FINDINGS: No focal consolidation. No pneumothorax or pleural effusion. Stable cardiomediastinal silhouette including moderate hiatal hernia. Aortic atherosclerotic calcifications. No acute osseous abnormality. IMPRESSION: No focal airspace disease. Electronically Signed   By: Primitivo Gauze M.D.   On: 12/07/2020 10:12    Procedures Procedures (including critical care time)  Medications Ordered in ED Medications  aspirin EC tablet 325 mg (has no administration in time range)  FLUoxetine (PROZAC) capsule 80 mg (has no administration in time range)  traZODone (DESYREL) tablet 200 mg (has no administration in time range)  enoxaparin (LOVENOX) injection 40 mg (has no administration in time range)  Ampicillin-Sulbactam (UNASYN) 3 g in sodium chloride 0.9 % 100 mL IVPB (has no administration in time range)  ipratropium-albuterol (DUONEB) 0.5-2.5 (3) MG/3ML nebulizer solution 3 mL (has no administration in time range)  albuterol (PROVENTIL) (2.5 MG/3ML) 0.083%  nebulizer solution 2.5 mg (has no administration in time range)  guaiFENesin (MUCINEX) 12 hr  tablet 600 mg (has no administration in time range)  budesonide (PULMICORT) nebulizer solution 0.25 mg (has no administration in time range)  lactated ringers infusion (has no administration in time range)  0.9 %  sodium chloride infusion ( Intravenous New Bag/Given 12/07/20 1015)  sodium chloride 0.9 % bolus 500 mL (0 mLs Intravenous Stopped 12/07/20 1239)  albuterol (VENTOLIN HFA) 108 (90 Base) MCG/ACT inhaler 4 puff (4 puffs Inhalation Given 12/07/20 1237)  iohexol (OMNIPAQUE) 350 MG/ML injection 75 mL (75 mLs Intravenous Contrast Given 12/07/20 1223)  cefTRIAXone (ROCEPHIN) 1 g in sodium chloride 0.9 % 100 mL IVPB (0 g Intravenous Stopped 12/07/20 1413)  azithromycin (ZITHROMAX) 500 mg in sodium chloride 0.9 % 250 mL IVPB (0 mg Intravenous Stopped 12/07/20 1531)    ED Course  I have reviewed the triage vital signs and the nursing notes.  Pertinent labs & imaging results that were available during my care of the patient were reviewed by me and considered in my medical decision making (see chart for details).  Clinical Course as of 12/07/20 1744  Sat Dec 07, 2020  0952 Chest x-ray did not show any obvious infiltrates.  Awaiting radiology reading. [MB]  [phone] Patient received 125 Solu-Medrol prior to arrival. [MB]  1316 Discussed with Dr. Roderic Palau [facility] hospitalist who will evaluate the patient for admission. [MB]    Clinical Course User Index [MB] Hayden Rasmussen, MD   MDM Rules/Calculators/A&P                         Mindy Mills was evaluated in Emergency Department on 12/07/2020 for the symptoms described in the history of present illness. She was evaluated in the context of the global COVID-19 pandemic, which necessitated consideration that the patient might be at risk for infection with the SARS-CoV-2 virus that causes COVID-19. Institutional protocols and algorithms that pertain to the  evaluation of patients at risk for COVID-19 are in a state of rapid change based on information released by regulatory bodies including the CDC and federal and state organizations. These policies and algorithms were followed during the patient's care in the ED.  This patient complains of ; this involves an extensive number of treatment Options and is a complaint that carries with it a high risk of complications and Morbidity. The differential includes Covid, pneumonia, pneumothorax, PE, anemia, metabolic derangement, COPD exacerbation, ACS  I ordered, reviewed and interpreted labs, which included CBC with elevated white count, elevated hemoglobin likely indicating some infection and dehydration.  Chemistries fairly normal other than some elevation BUN and creatinine patient AST and ALT.  Inflammatory markers elevated.  Covid testing negative. Troponin elevated and will need to be trended I ordered medication albuterol inhaler IV antibiotics I ordered imaging studies which included chest x-ray and CT angio chest and I independently    visualized and interpreted imaging which showed right-sided infiltrate, no PE Previous records obtained and reviewed in epic, no recent admissions I consulted Dr. Roderic Palau and discussed lab and imaging findings  Critical Interventions: None  After the interventions stated above, I reevaluated the patient and found patient still to be requiring oxygen.  I reviewed her work-up with her and she is comfortable plan for admission to the hospital.   Final Clinical Impression(s) / ED Diagnoses Final diagnoses:  Acute respiratory failure with hypoxia (HCC)  Pneumonia of right lower lobe due to infectious organism  Elevated troponin  Elevated LFTs    Rx / DC Orders ED  Discharge Orders    None       Hayden Rasmussen, MD 12/07/20 [phone]

## 2020-12-07 NOTE — H&P (Addendum)
History and Physical    Mindy Mills MWN:[id number] DOB: [birth year] DOA: 12/07/2020  PCP: Mindy Mills, No Pcp Per  Mindy Mills coming from: home  I have personally briefly reviewed Mindy Mills's old medical records in [place]  Chief Complaint: shortness of breath  HPI: Mindy Mills is a 69 y.o. 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  Mindy Mills has since had PEG tube and tracheostomy removed.  She reports that for the past 3 days, she has progressive shortness of breath and cough.  She denies any fever.  She denies any vomiting.  She has not felt as though she is been choking after eating or drinking.  She has not had any chest pain.  She feels increasingly weak.  No dysuria, diarrhea.  Since she had progressive shortness of breath, she called EMS for further evaluation  ED Course: Upon arrival to the emergency room, Mindy Mills is noted to have a mildly elevated creatinine.  Oxygen has been applied since she was noted to be hypoxic.  Oxygen saturations were 88% on room air.  D-dimer was noted to be elevated and CT of the chest was performed that indicated evidence of aspiration pneumonia.  COVID-19 test was found to be negative.  Troponin mildly elevated around 300 and EKG showed T wave inversions in the anterolateral leads.  She was referred for admission.  Review of Systems:  Review of Systems  Constitutional: Negative for chills and fever.  HENT: Negative for sore throat.   Eyes: Negative for blurred vision and double vision.  Respiratory: Positive for cough, sputum production and shortness of breath.   Cardiovascular: Negative for chest pain and leg swelling.  Gastrointestinal: Negative for abdominal pain, diarrhea, heartburn, nausea and vomiting.  Genitourinary: Negative for dysuria.  Musculoskeletal: Negative for myalgias.  Neurological: Positive for weakness  and headaches. Negative for focal weakness.      Past Medical History:  Diagnosis Date  . 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 Full dentures   . Hepatitis C    denies  . Hypertension   . Migraine   . Myocardial infarction ([place])   . 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  Last one in 2015  . Wears glasses   . Wears hearing aid in both ears     Past Surgical History:  Procedure Laterality Date  . ABDOMINAL HYSTERECTOMY    . BUNIONECTOMY    . COLONOSCOPY    . MULTIPLE TOOTH EXTRACTIONS    . OPEN REDUCTION INTERNAL FIXATION (ORIF) DISTAL RADIAL FRACTURE Right 09/07/2018   Procedure: OPEN REDUCTION INTERNAL FIXATION (ORIF) DISTAL RADIAL FRACTURE;  Surgeon: Iran Planas, MD;  Location: [place];  Service: Orthopedics;  Laterality: Right;  . PEG PLACEMENT N/A 09/21/2014   Procedure: PERCUTANEOUS ENDOSCOPIC GASTROSTOMY (PEG) PLACEMENT (BED SIDE);  Surgeon: Georganna Skeans, MD;  Location: [place];  Service: General;  Laterality: N/A;  . RADIOLOGY WITH ANESTHESIA N/A 09/11/2014   Procedure: Sub Arachnoid Aneurysm Coiling -RADIOLOGY WITH ANESTHESIA ;  Surgeon: Consuella Lose, MD;  Location: [place];  Service: Radiology;  Laterality: N/A;    Social History:  reports that she quit smoking about 5 years ago. Her smoking use included cigarettes. She smoked 0.50 packs per day. She has never used smokeless tobacco. She reports previous alcohol use. She reports that she does not use drugs.  Allergies  Allergen Reactions  . Bee Venom Anaphylaxis  and Hives  . Capsicum Gretta Arab) [Cayenne] Swelling    Family History  Problem Relation Age of Onset  . Alcohol abuse Father     Prior to Admission medications   Medication Sig Start Date End Date Taking? Authorizing Provider  aspirin 325 MG EC tablet Take 325 mg by mouth daily.   Yes [provider]  cholecalciferol (VITAMIN D) 1000 units tablet Take 1,000 Units by mouth daily.   Yes [provider]  FLUoxetine (PROZAC) 20 MG  capsule Take 80 mg by mouth daily.   Yes [provider]  traZODone (DESYREL) 100 MG tablet Take 200 mg by mouth at bedtime.   Yes [provider]  clonazePAM (KLONOPIN) 0.5 MG tablet Take 1 tablet (0.5 mg total) by mouth 2 (two) times daily. Mindy Mills not taking: No sig reported 09/11/15   Jonetta Osgood, MD  docusate sodium (COLACE) 100 MG capsule Take 1 capsule (100 mg total) by mouth 2 (two) times daily. Mindy Mills not taking: No sig reported 09/09/18   Gertie Fey Bonham, PA  EPINEPHrine 0.3 mg/0.3 mL IJ SOAJ injection Inject 0.3 mg into the muscle once as needed (for allergic reaction).    [provider]  feeding supplement, ENSURE ENLIVE, (ENSURE ENLIVE) LIQD Take 237 mLs by mouth daily. Mindy Mills not taking: Reported on 12/07/2020 09/11/15   Jonetta Osgood, MD  methocarbamol (ROBAXIN) 500 MG tablet Take 1 tablet (500 mg total) by mouth every 6 (six) hours as needed for muscle spasms. Mindy Mills not taking: No sig reported 09/09/18   Brynda Peon, PA  oxyCODONE-acetaminophen (PERCOCET) 5-325 MG tablet Take 1-2 tablets by mouth every 6 (six) hours as needed. Mindy Mills not taking: No sig reported 08/31/18   Charlesetta Shanks, MD  pantoprazole (PROTONIX) 40 MG tablet Take 1 tablet (40 mg total) by mouth at bedtime. Mindy Mills not taking: No sig reported 09/11/15   Jonetta Osgood, MD  rosuvastatin (CRESTOR) 40 MG tablet Take 20 mg by mouth daily. Mindy Mills not taking: No sig reported    [provider]  traMADol (ULTRAM) 50 MG tablet Take 100 mg by mouth 4 (four) times daily as needed for moderate pain. Mindy Mills not taking: No sig reported    [provider]    Physical Exam: Vitals:   12/07/20 1400 12/07/20 1430 12/07/20 1433 12/07/20 1602  BP: 126/79 122/80 124/80 107/83  Pulse: 96 97 95 97  Resp: 16 20 (!) 21 (!) 22  Temp:   98 F (36.7 C) 98.4 F (36.9 C)  TempSrc:   Oral Oral  SpO2: 93% 94% 94% 95%  Weight:      Height:         Constitutional: NAD, calm, comfortable Eyes: PERRL, lids and conjunctivae normal ENMT: Mucous membranes are moist. Posterior pharynx clear of any exudate or lesions.Normal dentition.  Neck: normal, supple, no masses, no thyromegaly Respiratory: coarse breath sounds bilaterally. Normal respiratory effort. No accessory muscle use.  Cardiovascular: Regular rate and rhythm, no murmurs / rubs / gallops. No extremity edema. 2+ pedal pulses. No carotid bruits.  Abdomen: no tenderness, no masses palpated. No hepatosplenomegaly. Bowel sounds positive.  Musculoskeletal: no clubbing / cyanosis. No joint deformity upper and lower extremities. Good ROM, no contractures. Normal muscle tone.  Skin: no rashes, lesions, ulcers. No induration Neurologic: CN 2-12 grossly intact. Sensation intact, DTR normal. Strength 5/5 in all 4.  Psychiatric: Normal judgment and insight. Alert and oriented x 3. Normal mood.    Labs on Admission: I have  personally reviewed following labs and imaging studies  CBC: Recent Labs  Lab 12/07/20 0953  WBC 14.9*  NEUTROABS 12.1*  HGB 16.8*  HCT 53.1*  MCV 96.0  PLT 601   Basic Metabolic Panel: Recent Labs  Lab 12/07/20 0953  NA 144  K 4.0  CL 106  CO2 25  GLUCOSE 118*  BUN 44*  CREATININE 1.22*  CALCIUM 9.2   GFR: Estimated Creatinine Clearance: 32.9 mL/min (A) (by C-G formula based on SCr of 1.22 mg/dL (H)). Liver Function Tests: Recent Labs  Lab 12/07/20 0953  AST 66*  ALT 95*  ALKPHOS 72  BILITOT 0.7  PROT 8.0  ALBUMIN 3.5   No results for input(s): LIPASE, AMYLASE in the last 168 hours. No results for input(s): AMMONIA in the last 168 hours. Coagulation Profile: No results for input(s): INR, PROTIME in the last 168 hours. Cardiac Enzymes: Recent Labs  Lab 12/07/20 1604  CKTOTAL 348*   BNP (last 3 results) No results for input(s): PROBNP in the last 8760 hours. HbA1C: No results for input(s): HGBA1C in the last 72 hours. CBG: No  results for input(s): GLUCAP in the last 168 hours. Lipid Profile: Recent Labs    12/07/20 0953  TRIG 193*   Thyroid Function Tests: No results for input(s): TSH, T4TOTAL, FREET4, T3FREE, THYROIDAB in the last 72 hours. Anemia Panel: Recent Labs    12/07/20 0953  FERRITIN 85   Urine analysis:    Component Value Date/Time   COLORURINE YELLOW 09/08/2015 1817   APPEARANCEUR CLEAR 09/08/2015 1817   LABSPEC 1.038 (H) 09/08/2015 1817   PHURINE 6.5 09/08/2015 1817   GLUCOSEU NEGATIVE 09/08/2015 1817   HGBUR LARGE (A) 09/08/2015 1817   BILIRUBINUR NEGATIVE 09/08/2015 1817   KETONESUR NEGATIVE 09/08/2015 1817   PROTEINUR 30 (A) 09/08/2015 1817   UROBILINOGEN 1.0 09/08/2015 1817   NITRITE NEGATIVE 09/08/2015 1817   LEUKOCYTESUR SMALL (A) 09/08/2015 1817    Radiological Exams on Admission: CT Angio Chest PE W/Cm &/Or Wo Cm  Result Date: 12/07/2020 CLINICAL DATA:  Chest pain for several days and elevated D-dimer. EXAM: CT ANGIOGRAPHY CHEST WITH CONTRAST TECHNIQUE: Multidetector CT imaging of the chest was performed using the standard protocol during bolus administration of intravenous contrast. Multiplanar CT image reconstructions and MIPs were obtained to evaluate the vascular anatomy. CONTRAST:  20mL OMNIPAQUE IOHEXOL 350 MG/ML SOLN COMPARISON:  None. FINDINGS: Cardiovascular: The heart is borderline enlarged for age. No pericardial effusion. Significant age advanced atherosclerotic calcifications involving the thoracic aorta and branch vessels. Three-vessel coronary artery calcifications are noted. The pulmonary arterial tree is well opacified. No filling defects to suggest pulmonary embolism. Mediastinum/Nodes: Borderline right hilar lymph nodes likely reactive/inflammatory. No mediastinal adenopathy. There is a large hiatal hernia. Lungs/Pleura: Severe emphysematous changes and pulmonary scarring. Extensive fluid and debris filling the right lower lobe bronchus and its branches highly  suggestive acute aspiration. There is also mild peribronchial thickening and peribronchial inflammatory changes. Right lower lobe atelectasis is noted adjacent to the hiatal hernia. No pleural effusions. No worrisome pulmonary lesions. Upper Abdomen: Chronic appearing left-sided hydronephrosis with fairly significant renal cortical thinning. Aortic and branch vessel calcifications are noted. Low-attenuation left adrenal gland lesion, likely benign adenoma. Musculoskeletal: No breast masses, supraclavicular or axillary adenopathy. The bony thorax is intact. No worrisome bone lesions or acute fractures. Review of the MIP images confirms the above findings. IMPRESSION: 1. No CT findings for pulmonary embolism. 2. Significant age advanced atherosclerotic calcifications involving the thoracic aorta and branch vessels including  the coronary arteries. 3. Severe emphysematous changes and pulmonary scarring. 4. Extensive fluid and debris filling the right lower lobe bronchus and its branches highly suggestive of acute aspiration. There is also mild peribronchial thickening and peribronchial inflammatory changes. 5. Large hiatal hernia. 6. Chronic appearing left-sided hydronephrosis with fairly significant renal cortical thinning. 7. Emphysema and aortic atherosclerosis. Aortic Atherosclerosis (ICD10-I70.0) and Emphysema (ICD10-J43.9). Electronically Signed   By: Marijo Sanes M.D.   On: 12/07/2020 12:33   DG Chest Port 1 View  Result Date: 12/07/2020 CLINICAL DATA:  cough EXAM: PORTABLE CHEST 1 VIEW COMPARISON:  10/10/2020 and prior. FINDINGS: No focal consolidation. No pneumothorax or pleural effusion. Stable cardiomediastinal silhouette including moderate hiatal hernia. Aortic atherosclerotic calcifications. No acute osseous abnormality. IMPRESSION: No focal airspace disease. Electronically Signed   By: Primitivo Gauze M.D.   On: 12/07/2020 10:12    EKG: Independently reviewed. Twave inversions in  antero-lateral leads  Assessment/Plan Active Problems:   Acute respiratory failure (HCC)   Aspiration pneumonia ([facility])   Elevated troponin     Acute respiratory failure with hypoxia -Secondary to pneumonia -Wean off oxygen as tolerated  Aspiration pneumonia -Noted to have pneumonia with debris in upper airways on CT scan -Started on Unasyn -Continue pulmonary hygiene with bronchodilators and mucolytic's -swallow evaluation  Elevated troponin -Mindy Mills did not complain of any chest pain -She does have some EKG changes -Troponin elevated at 333, delta troponin flat at 338 -Case reviewed with cardiologist, Dr. Marcelle Smiling -It was felt that her elevation in troponin was likely demand ischemia in the setting of infection and hypoxia -Recommendations were to check echocardiogram -Repeat troponin if Mindy Mills develops chest pain -Continue on aspirin  DVT prophylaxis: lovenox Code Status: full code  Family Communication:  Discussed with Mindy Mills Disposition Plan: discharge home once respiratory status has improved  Consults called:   Admission status: inpatient, tele   Kathie Dike MD [facility]   If 7PM-7AM, please contact night-coverage [email or web address]   12/07/2020, 5:20 PM

## 2020-12-07 NOTE — Progress Notes (Signed)
CRITICAL VALUE ALERT  Critical Value:  Troponin of 338  Date & Time Notied:  12/11/2021at 1701  Provider Notified: Dr. Roderic Palau  Orders Received/Actions taken: MD aware. No orders at this time. Will monitor.

## 2020-12-07 NOTE — ED Notes (Signed)
CRITICAL VALUE ALERT  Critical Value:  Troponin 333  Date & Time Notied:  12/11 12:52  Provider Notified: Melina Copa  Orders Received/Actions taken:

## 2020-12-08 LAB — CBC
HCT: 48.2 % — ABNORMAL HIGH (ref 36.0–46.0)
Hemoglobin: 15.2 g/dL — ABNORMAL HIGH (ref 12.0–15.0)
MCH: 30.6 pg (ref 26.0–34.0)
MCHC: 31.5 g/dL (ref 30.0–36.0)
MCV: 97 fL (ref 80.0–100.0)
Platelets: 272 10*3/uL (ref 150–400)
RBC: 4.97 MIL/uL (ref 3.87–5.11)
RDW: 14.6 % (ref 11.5–15.5)
WBC: 10.1 10*3/uL (ref 4.0–10.5)
nRBC: 0 % (ref 0.0–0.2)

## 2020-12-08 LAB — COMPREHENSIVE METABOLIC PANEL
ALT: 74 U/L — ABNORMAL HIGH (ref 0–44)
AST: 43 U/L — ABNORMAL HIGH (ref 15–41)
Albumin: 3.2 g/dL — ABNORMAL LOW (ref 3.5–5.0)
Alkaline Phosphatase: 60 U/L (ref 38–126)
Anion gap: 12 (ref 5–15)
BUN: 31 mg/dL — ABNORMAL HIGH (ref 8–23)
CO2: 25 mmol/L (ref 22–32)
Calcium: 8.6 mg/dL — ABNORMAL LOW (ref 8.9–10.3)
Chloride: 106 mmol/L (ref 98–111)
Creatinine, Ser: 1.06 mg/dL — ABNORMAL HIGH (ref 0.44–1.00)
GFR, Estimated: 57 mL/min — ABNORMAL LOW (ref >60)
Glucose, Bld: 110 mg/dL — ABNORMAL HIGH (ref 70–99)
Potassium: 4 mmol/L (ref 3.5–5.1)
Sodium: 143 mmol/L (ref 135–145)
Total Bilirubin: 0.7 mg/dL (ref 0.3–1.2)
Total Protein: 7.3 g/dL (ref 6.5–8.1)

## 2020-12-08 LAB — GLUCOSE, CAPILLARY: Glucose-Capillary: 104 mg/dL — ABNORMAL HIGH (ref 70–99)

## 2020-12-08 LAB — HIV ANTIBODY (ROUTINE TESTING W REFLEX): HIV Screen 4th Generation wRfx: NONREACTIVE

## 2020-12-08 NOTE — Progress Notes (Signed)
PROGRESS NOTE    ALEKHYA GRAVLIN  WUJ:[id number] DOB: 1951-12-05 DOA: 12/07/2020 PCP: Patient, No Pcp Per    Brief Narrative:  69 year old female admitted to the hospital with shortness of breath and weakness.  Found to have aspiration pneumonia.  Started on intravenous Unasyn.  She was also noted to have elevated troponin and EKG changes without any chest pain.  Felt to be demand ischemia in the setting of infection/hypoxia.  Echocardiogram has been ordered.   Assessment & Plan:   Active Problems:   Acute respiratory failure (HCC)   Aspiration pneumonia (HCC)   Elevated troponin   Acute respiratory failure with hypoxia -Secondary to pneumonia -Wean off oxygen as tolerated  Aspiration pneumonia -Noted to have pneumonia with debris in upper airways on CT scan -Started on Unasyn -Continue pulmonary hygiene with bronchodilators and mucolytic's -swallow evaluation has been requested  Elevated troponin -Patient did not complain of any chest pain -She does have some EKG changes -Troponin elevated at 333, delta troponin flat at 338 -Case reviewed with cardiologist at [facility], Dr. Marcelle Smiling -It was felt that her elevation in troponin was likely demand ischemia in the setting of infection and hypoxia -Echocardiogram pending -Repeat troponin only if patient develops chest pain -Continue on aspirin    DVT prophylaxis: enoxaparin (LOVENOX) injection 40 mg Start: 12/07/20 2200  Code Status: Full code Family Communication: Discussed with patient Disposition Plan: Status is: Inpatient  Remains inpatient appropriate because:Ongoing diagnostic testing needed not appropriate for outpatient work up   Dispo: The patient is from: Home              Anticipated d/c is to: Home              Anticipated d/c date is: 1 day              Patient currently is not medically stable to d/c.   Consultants:     Procedures:     Antimicrobials:   Unasyn 12/11 >   Subjective: She has  productive cough.  Overall weakness is doing better.  Objective: Vitals:   12/08/20 0753 12/08/20 0801 12/08/20 1411 12/08/20 1430  BP:   108/67   Pulse:   95   Resp:   17   Temp:   99 F (37.2 C)   TempSrc:   Oral   SpO2: 96% 96% 97% 97%  Weight:      Height:        Intake/Output Summary (Last 24 hours) at 12/08/2020 1720 Last data filed at 12/08/2020 1502 Gross per 24 hour  Intake 1878.75 ml  Output 200 ml  Net 1678.75 ml   Filed Weights   12/07/20 0941  Weight: 47.2 kg    Examination:  General exam: Appears calm and comfortable  Respiratory system: Bilateral rhonchi. Respiratory effort normal. Cardiovascular system: S1 & S2 heard, RRR. No JVD, murmurs, rubs, gallops or clicks. No pedal edema. Gastrointestinal system: Abdomen is nondistended, soft and nontender. No organomegaly or masses felt. Normal bowel sounds heard. Central nervous system: Alert and oriented. No focal neurological deficits. Extremities: Symmetric 5 x 5 power. Skin: No rashes, lesions or ulcers Psychiatry: Judgement and insight appear normal. Mood & affect appropriate.     Data Reviewed: I have personally reviewed following labs and imaging studies  CBC: Recent Labs  Lab 12/07/20 0953 12/08/20 0705  WBC 14.9* 10.1  NEUTROABS 12.1*  --   HGB 16.8* 15.2*  HCT 53.1* 48.2*  MCV 96.0 97.0  PLT 319 272  Basic Metabolic Panel: Recent Labs  Lab 12/07/20 0953 12/08/20 0705  NA 144 143  K 4.0 4.0  CL 106 106  CO2 25 25  GLUCOSE 118* 110*  BUN 44* 31*  CREATININE 1.22* 1.06*  CALCIUM 9.2 8.6*   GFR: Estimated Creatinine Clearance: 37.8 mL/min (A) (by C-G formula based on SCr of 1.06 mg/dL (H)). Liver Function Tests: Recent Labs  Lab 12/07/20 0953 12/08/20 0705  AST 66* 43*  ALT 95* 74*  ALKPHOS 72 60  BILITOT 0.7 0.7  PROT 8.0 7.3  ALBUMIN 3.5 3.2*   No results for input(s): LIPASE, AMYLASE in the last 168 hours. No results for input(s): AMMONIA in the last 168  hours. Coagulation Profile: No results for input(s): INR, PROTIME in the last 168 hours. Cardiac Enzymes: Recent Labs  Lab 12/07/20 1604  CKTOTAL 348*   BNP (last 3 results) No results for input(s): PROBNP in the last 8760 hours. HbA1C: No results for input(s): HGBA1C in the last 72 hours. CBG: Recent Labs  Lab 12/08/20 1557  GLUCAP 104*   Lipid Profile: Recent Labs    12/07/20 0953  TRIG 193*   Thyroid Function Tests: No results for input(s): TSH, T4TOTAL, FREET4, T3FREE, THYROIDAB in the last 72 hours. Anemia Panel: Recent Labs    12/07/20 0953  FERRITIN 85   Sepsis Labs: Recent Labs  Lab 12/07/20 3151 12/07/20 0953 12/07/20 1132  PROCALCITON  --  0.48  --   LATICACIDVEN 1.8  --  1.5    Recent Results (from the past 240 hour(s))  Resp Panel by RT-PCR (Flu A&B, Covid) Nasopharyngeal Swab     Status: None   Collection Time: 12/07/20  9:37 AM   Specimen: Nasopharyngeal Swab; Nasopharyngeal(NP) swabs in vial transport medium  Result Value Ref Range Status   SARS Coronavirus 2 by RT PCR NEGATIVE NEGATIVE Final    Comment: (NOTE) SARS-CoV-2 target nucleic acids are NOT DETECTED.  The SARS-CoV-2 RNA is generally detectable in upper respiratory specimens during the acute phase of infection. The lowest concentration of SARS-CoV-2 viral copies this assay can detect is 138 copies/mL. A negative result does not preclude SARS-Cov-2 infection and should not be used as the sole basis for treatment or other patient management decisions. A negative result may occur with  improper specimen collection/handling, submission of specimen other than nasopharyngeal swab, presence of viral mutation(s) within the areas targeted by this assay, and inadequate number of viral copies(<138 copies/mL). A negative result must be combined with clinical observations, patient history, and epidemiological information. The expected result is Negative.  Fact Sheet for Patients:   EntrepreneurPulse.com.au  Fact Sheet for Healthcare Providers:  IncredibleEmployment.be  This test is no t yet approved or cleared by the [place] FDA and  has been authorized for detection and/or diagnosis of SARS-CoV-2 by FDA under an Emergency Use Authorization (EUA). This EUA will remain  in effect (meaning this test can be used) for the duration of the COVID-19 declaration under Section 564(b)(1) of the Act, 21 U.S.C.section 360bbb-3(b)(1), unless the authorization is terminated  or revoked sooner.       Influenza A by PCR NEGATIVE NEGATIVE Final   Influenza B by PCR NEGATIVE NEGATIVE Final    Comment: (NOTE) The Xpert Xpress SARS-CoV-2/FLU/RSV plus assay is intended as an aid in the diagnosis of influenza from Nasopharyngeal swab specimens and should not be used as a sole basis for treatment. Nasal washings and aspirates are unacceptable for Xpert Xpress SARS-CoV-2/FLU/RSV testing.  Fact Sheet  for Patients: EntrepreneurPulse.com.au  Fact Sheet for Healthcare Providers: IncredibleEmployment.be  This test is not yet approved or cleared by the [place] FDA and has been authorized for detection and/or diagnosis of SARS-CoV-2 by FDA under an Emergency Use Authorization (EUA). This EUA will remain in effect (meaning this test can be used) for the duration of the COVID-19 declaration under Section 564(b)(1) of the Act, 21 U.S.C. section 360bbb-3(b)(1), unless the authorization is terminated or revoked.  Performed at [facility], [address], [place], [place] [address]   Blood Culture (routine x 2)     Status: None (Preliminary result)   Collection Time: 12/07/20  9:53 AM   Specimen: Right Antecubital; Blood  Result Value Ref Range Status   Specimen Description   Final    RIGHT ANTECUBITAL BOTTLES DRAWN AEROBIC AND ANAEROBIC   Special Requests Blood Culture adequate volume  Final    Culture   Final    NO GROWTH < 24 HOURS Performed at [facility], [address] [place], [place] [address]    Report Status PENDING  Incomplete  Blood Culture (routine x 2)     Status: None (Preliminary result)   Collection Time: 12/07/20 11:22 AM   Specimen: Right Antecubital; Blood  Result Value Ref Range Status   Specimen Description   Final    RIGHT ANTECUBITAL BOTTLES DRAWN AEROBIC AND ANAEROBIC   Special Requests Blood Culture adequate volume  Final   Culture   Final    NO GROWTH < 24 HOURS Performed at [facility], [address], [place], [place] [address]    Report Status PENDING  Incomplete         Radiology Studies: CT Angio Chest PE W/Cm &/Or Wo Cm  Result Date: 12/07/2020 CLINICAL DATA:  Chest pain for several days and elevated D-dimer. EXAM: CT ANGIOGRAPHY CHEST WITH CONTRAST TECHNIQUE: Multidetector CT imaging of the chest was performed using the standard protocol during bolus administration of intravenous contrast. Multiplanar CT image reconstructions and MIPs were obtained to evaluate the vascular anatomy. CONTRAST:  72mL OMNIPAQUE IOHEXOL 350 MG/ML SOLN COMPARISON:  None. FINDINGS: Cardiovascular: The heart is borderline enlarged for age. No pericardial effusion. Significant age advanced atherosclerotic calcifications involving the thoracic aorta and branch vessels. Three-vessel coronary artery calcifications are noted. The pulmonary arterial tree is well opacified. No filling defects to suggest pulmonary embolism. Mediastinum/Nodes: Borderline right hilar lymph nodes likely reactive/inflammatory. No mediastinal adenopathy. There is a large hiatal hernia. Lungs/Pleura: Severe emphysematous changes and pulmonary scarring. Extensive fluid and debris filling the right lower lobe bronchus and its branches highly suggestive acute aspiration. There is also mild peribronchial thickening and peribronchial inflammatory changes. Right lower lobe atelectasis is noted adjacent to  the hiatal hernia. No pleural effusions. No worrisome pulmonary lesions. Upper Abdomen: Chronic appearing left-sided hydronephrosis with fairly significant renal cortical thinning. Aortic and branch vessel calcifications are noted. Low-attenuation left adrenal gland lesion, likely benign adenoma. Musculoskeletal: No breast masses, supraclavicular or axillary adenopathy. The bony thorax is intact. No worrisome bone lesions or acute fractures. Review of the MIP images confirms the above findings. IMPRESSION: 1. No CT findings for pulmonary embolism. 2. Significant age advanced atherosclerotic calcifications involving the thoracic aorta and branch vessels including the coronary arteries. 3. Severe emphysematous changes and pulmonary scarring. 4. Extensive fluid and debris filling the right lower lobe bronchus and its branches highly suggestive of acute aspiration. There is also mild peribronchial thickening and peribronchial inflammatory changes. 5. Large hiatal hernia. 6. Chronic appearing left-sided  hydronephrosis with fairly significant renal cortical thinning. 7. Emphysema and aortic atherosclerosis. Aortic Atherosclerosis (ICD10-I70.0) and Emphysema (ICD10-J43.9). Electronically Signed   By: Marijo Sanes M.D.   On: 12/07/2020 12:33   DG Chest Port 1 View  Result Date: 12/07/2020 CLINICAL DATA:  cough EXAM: PORTABLE CHEST 1 VIEW COMPARISON:  10/10/2020 and prior. FINDINGS: No focal consolidation. No pneumothorax or pleural effusion. Stable cardiomediastinal silhouette including moderate hiatal hernia. Aortic atherosclerotic calcifications. No acute osseous abnormality. IMPRESSION: No focal airspace disease. Electronically Signed   By: Primitivo Gauze M.D.   On: 12/07/2020 10:12        Scheduled Meds: . aspirin  325 mg Oral Daily  . budesonide (PULMICORT) nebulizer solution  0.25 mg Nebulization BID  . enoxaparin (LOVENOX) injection  40 mg Subcutaneous Q24H  . FLUoxetine  80 mg Oral Daily  .  guaiFENesin  600 mg Oral BID  . ipratropium-albuterol  3 mL Nebulization Q6H  . traZODone  200 mg Oral QHS   Continuous Infusions: . ampicillin-sulbactam (UNASYN) IV 3 g (12/08/20 1142)  . lactated ringers 75 mL/hr at 12/08/20 0623     LOS: 1 day    Time spent: 21mins    Kathie Dike, MD [facility]   If 7PM-7AM, please contact night-coverage [email or web address]  12/08/2020, 5:20 PM

## 2020-12-09 ENCOUNTER — Inpatient Hospital Stay (HOSPITAL_COMMUNITY): Payer: Medicare Other

## 2020-12-09 DIAGNOSIS — I5021 Acute systolic (congestive) heart failure: Secondary | ICD-10-CM | POA: Diagnosis present

## 2020-12-09 DIAGNOSIS — J439 Emphysema, unspecified: Secondary | ICD-10-CM | POA: Diagnosis present

## 2020-12-09 DIAGNOSIS — I361 Nonrheumatic tricuspid (valve) insufficiency: Secondary | ICD-10-CM

## 2020-12-09 DIAGNOSIS — R0602 Shortness of breath: Secondary | ICD-10-CM

## 2020-12-09 LAB — ECHOCARDIOGRAM COMPLETE
AR max vel: 1.83 cm2
AV Area VTI: 1.84 cm2
AV Area mean vel: 1.61 cm2
AV Mean grad: 2.8 mmHg
AV Peak grad: 5 mmHg
Ao pk vel: 1.12 m/s
Area-P 1/2: 4.63 cm2
Height: 63 in
S' Lateral: 2.51 cm
Weight: 1664 oz

## 2020-12-09 MED ORDER — TRAMADOL HCL 50 MG PO TABS
50.0000 mg | ORAL_TABLET | Freq: Four times a day (QID) | ORAL | Status: DC | PRN
  Administered 2020-12-09: 50 mg via ORAL
  Filled 2020-12-09: qty 1

## 2020-12-09 MED ORDER — METOPROLOL SUCCINATE ER 25 MG PO TB24
12.5000 mg | ORAL_TABLET | Freq: Every day | ORAL | Status: DC
  Administered 2020-12-09 – 2020-12-24 (×14): 12.5 mg via ORAL
  Filled 2020-12-09 (×16): qty 1

## 2020-12-09 MED ORDER — IPRATROPIUM-ALBUTEROL 0.5-2.5 (3) MG/3ML IN SOLN
3.0000 mL | Freq: Two times a day (BID) | RESPIRATORY_TRACT | Status: DC
  Administered 2020-12-10 – 2020-12-11 (×3): 3 mL via RESPIRATORY_TRACT
  Filled 2020-12-09 (×3): qty 3

## 2020-12-09 NOTE — Evaluation (Signed)
Clinical/Bedside Swallow Evaluation Patient Details  Name: Mindy Mills MRN: [id number] Date of Birth: 18-Sep-1951  Today's Date: 12/09/2020 Time: SLP Start Time (ACUTE ONLY): 0900 SLP Stop Time (ACUTE ONLY): 0923 SLP Time Calculation (min) (ACUTE ONLY): 23 min  Past Medical History:  Past Medical History:  Diagnosis Date  . 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 Full dentures   . Hepatitis C    denies  . Hypertension   . Migraine   . Myocardial infarction ([place])   . 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  Last one in 2015  . Wears glasses   . Wears hearing aid in both ears    Past Surgical History:  Past Surgical History:  Procedure Laterality Date  . ABDOMINAL HYSTERECTOMY    . BUNIONECTOMY    . COLONOSCOPY    . MULTIPLE TOOTH EXTRACTIONS    . OPEN REDUCTION INTERNAL FIXATION (ORIF) DISTAL RADIAL FRACTURE Right 09/07/2018   Procedure: OPEN REDUCTION INTERNAL FIXATION (ORIF) DISTAL RADIAL FRACTURE;  Surgeon: Iran Planas, MD;  Location: [place];  Service: Orthopedics;  Laterality: Right;  . PEG PLACEMENT N/A 09/21/2014   Procedure: PERCUTANEOUS ENDOSCOPIC GASTROSTOMY (PEG) PLACEMENT (BED SIDE);  Surgeon: Georganna Skeans, MD;  Location: [place];  Service: General;  Laterality: N/A;  . RADIOLOGY WITH ANESTHESIA N/A 09/11/2014   Procedure: Sub Arachnoid Aneurysm Coiling -RADIOLOGY WITH ANESTHESIA ;  Surgeon: Consuella Lose, MD;  Location: [place];  Service: Radiology;  Laterality: N/A;   HPI:  Mindy Mills is a 69 y.o. 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  Patient has since had PEG tube and tracheostomy removed.  She reports that for the past 3 days, she has progressive shortness of breath and cough.  She denies any fever.  She denies any vomiting.  She has not felt as though she is been choking after eating or drinking.  She has not had any chest  pain.  She feels increasingly weak.  No dysuria, diarrhea.  Since she had progressive shortness of breath, she called EMS for further evaluation. CT of chest showed: Extensive fluid and debris filling the right lower lobe bronchus and its branches highly suggestive of acute aspiration. There is also mild peribronchial thickening and peribronchial inflammatory changes. Large hiatal hernia. BSE requested.   Assessment / Plan / Recommendation Clinical Impression  Clinical swallow evaluation completed at bedsid. Pt is edentulous and reports that she eats soft foods at home. She lives in a trailer with a roommate who does most of the cooking. Oral motor examination is WNL otherwise. Pt denies difficulty swallowing or recent bouts of PNA. SLP explained that current chest CT showed likely evidence of aspiration. Pt denies GERD and reports that she eats all meals upright and remains upright following. Pt shows no over signs or symptoms of aspiration with consistencies and textures presented. Pt with prolonged oral phase with solids due to edentulous status. Recommend D3/mech soft and thin liquids with standard aspiration and reflux precautions. No further SLP services indicated at this time. SLP will sign off. Reconsult if indicated. Pt in agreement with plan of care. SLP Visit Diagnosis: Dysphagia, unspecified (R13.10)    Aspiration Risk  No limitations    Diet Recommendation Dysphagia 3 (Mech soft);Thin liquid   Liquid Administration via: Cup;Straw Medication Administration: Whole meds with liquid Supervision: Patient able to self feed Postural Changes: Seated upright at  90 degrees;Remain upright for at least 30 minutes after po intake    Other  Recommendations Oral Care Recommendations: Oral care BID Other Recommendations: Clarify dietary restrictions   Follow up Recommendations None      Frequency and Duration            Prognosis Prognosis for Safe Diet Advancement: Good      Swallow  Study   General Date of Onset: 12/07/20 HPI: Mindy Mills is a 69 y.o. 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  Patient has since had PEG tube and tracheostomy removed.  She reports that for the past 3 days, she has progressive shortness of breath and cough.  She denies any fever.  She denies any vomiting.  She has not felt as though she is been choking after eating or drinking.  She has not had any chest pain.  She feels increasingly weak.  No dysuria, diarrhea.  Since she had progressive shortness of breath, she called EMS for further evaluation. CT of chest showed: Extensive fluid and debris filling the right lower lobe bronchus and its branches highly suggestive of acute aspiration. There is also mild peribronchial thickening and peribronchial inflammatory changes. Large hiatal hernia. BSE requested. Type of Study: Bedside Swallow Evaluation Previous Swallow Assessment: N/A Diet Prior to this Study: Regular;Thin liquids Temperature Spikes Noted: No Respiratory Status: Nasal cannula History of Recent Intubation: No Behavior/Cognition: Alert;Cooperative;Pleasant mood Oral Cavity Assessment: Within Functional Limits Oral Care Completed by SLP: Recent completion by staff Oral Cavity - Dentition: Edentulous Vision: Functional for self-feeding Self-Feeding Abilities: Able to feed self Patient Positioning: Upright in bed Baseline Vocal Quality: Normal Volitional Cough: Strong;Congested Volitional Swallow: Able to elicit    Oral/Motor/Sensory Function Overall Oral Motor/Sensory Function: Within functional limits   Ice Chips Ice chips: Within functional limits Presentation: Spoon   Thin Liquid Thin Liquid: Within functional limits Presentation: Cup;Self Fed;Straw    Nectar Thick Nectar Thick Liquid: Not tested   Honey Thick Honey Thick Liquid: Not tested   Puree  Puree: Within functional limits Presentation: Spoon   Solid     Solid: Impaired Presentation: Self Fed Oral Phase Impairments: Impaired mastication Oral Phase Functional Implications: Prolonged oral transit     Thank you,  Genene Churn, [place]  Lailany Enoch 12/09/2020,1:04 PM

## 2020-12-09 NOTE — Consult Note (Signed)
Mindy A. Merlene Laughter, MD     [email or web address]          Mindy Mills is an 69 y.o. female.   ASSESSMENT/PLAN: 1.  Risk of hemorrhage from the patient intracranial insults is assessed to be low.  She has had 2 imaging after cerebral aneurysm was repaired both showing no evidence of recurrence.  The risk of hemorrhage from her prior infarcts is also low and average risk compared to other people after strokes.  Consequently, it is fine for the patient to be placed on dual antiplatelet agents or anticoagulation if medically indicated. 2.  Episodic headaches consistent with migraine 3.  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  During the work-up the patient is noted to have suppressed ejection fraction and possible MI.  She may need to have cardiac catheterization, anticoagulation and possible anticoagulation.  Because of her history intracranial hemorrhage, aneurysm and stroke consultation is sought to assess her risk for dual antiplatelets and anticoagulation.  The patient recovered well from having aneurysmal repair in 2015.  She was hospitalized for very long time requiring tracheostomy and the had to be in rehab.  She recovering nicely however and is able to live independently.  She had no recurrent issues from the aneurysm.  She had a post procedural angiography which showed no residual aneurysms with no recurrence.  She is also had an MRA which showed no recurrence.  The patient continues to have episodic headaches about 5 monthly.  The headaches last anywhere from 1 to 3 days and are associated with nausea and vomiting at times.  The headaches are to be located in the frontal area.  The review of systems otherwise negative.   GENERAL: This is a thin  female who is doing okay at this time.  HEENT: Neck is supple no trauma noted.  ABDOMEN: soft  EXTREMITIES: No edema   BACK: Normal  SKIN: Normal by inspection.    MENTAL STATUS: Alert and oriented. Speech, language and cognition are generally intact. Judgment and insight normal.   CRANIAL NERVES: Pupils are equal, round and reactive to light and accomodation; extra ocular movements are full, there is no significant nystagmus; visual fields are full; upper and lower facial muscles are normal in strength and symmetric, there is no flattening of the nasolabial folds; tongue is midline; uvula is midline; shoulder elevation is normal.  MOTOR: Normal tone, bulk and strength; no pronator drift.  COORDINATION: Left finger to nose is normal, right finger to nose is normal, No rest tremor; no intention tremor; no postural tremor; no bradykinesia.  REFLEXES: Deep tendon reflexes are symmetrical and normal.   SENSATION: Normal to light touch, temperature, and pain.        Blood pressure 117/73, pulse 88, temperature 98.3 F (36.8 C), temperature source Oral, resp. rate 18, height 5\' 3"  (1.6 m), weight 47.2 kg, SpO2 94 %.  Past Medical History:  Diagnosis Date  . Cancer ([place])    Cervicle  . Full dentures   . Hepatitis C    denies  . Hypertension   . Migraine   . Myocardial infarction ([place])   . Stroke [facility]) 2015   Pt reports hx of CVA x3.  Last one in 2015  . Wears glasses   . Wears hearing aid in  both ears     Past Surgical History:  Procedure Laterality Date  . ABDOMINAL HYSTERECTOMY    . BUNIONECTOMY    . COLONOSCOPY    . MULTIPLE TOOTH EXTRACTIONS    . OPEN REDUCTION INTERNAL FIXATION (ORIF) DISTAL RADIAL FRACTURE Right 09/07/2018   Procedure: OPEN REDUCTION INTERNAL FIXATION (ORIF) DISTAL RADIAL FRACTURE;  Surgeon: Iran Planas, MD;  Location: [place];  Service: Orthopedics;  Laterality: Right;  . PEG PLACEMENT N/A 09/21/2014   Procedure: PERCUTANEOUS ENDOSCOPIC  GASTROSTOMY (PEG) PLACEMENT (BED SIDE);  Surgeon: Georganna Skeans, MD;  Location: [place];  Service: General;  Laterality: N/A;  . RADIOLOGY WITH ANESTHESIA N/A 09/11/2014   Procedure: Sub Arachnoid Aneurysm Coiling -RADIOLOGY WITH ANESTHESIA ;  Surgeon: Consuella Lose, MD;  Location: [place];  Service: Radiology;  Laterality: N/A;    Family History  Problem Relation Age of Onset  . Alcohol abuse Father     Social History:  reports that she quit smoking about 5 years ago. Her smoking use included cigarettes. She smoked 0.50 packs per day. She has never used smokeless tobacco. She reports previous alcohol use. She reports that she does not use drugs.  Allergies:  Allergies  Allergen Reactions  . Bee Venom Anaphylaxis and Hives  . Capsicum (Cayenne) [Cayenne] Swelling    Medications: Prior to Admission medications   Medication Sig Start Date End Date Taking? Authorizing Provider  aspirin 325 MG EC tablet Take 325 mg by mouth daily.   Yes [provider]  cholecalciferol (VITAMIN D) 1000 units tablet Take 1,000 Units by mouth daily.   Yes [provider]  FLUoxetine (PROZAC) 20 MG capsule Take 80 mg by mouth daily.   Yes [provider]  traZODone (DESYREL) 100 MG tablet Take 200 mg by mouth at bedtime.   Yes [provider]  clonazePAM (KLONOPIN) 0.5 MG tablet Take 1 tablet (0.5 mg total) by mouth 2 (two) times daily. Patient not taking: No sig reported 09/11/15   Jonetta Osgood, MD  docusate sodium (COLACE) 100 MG capsule Take 1 capsule (100 mg total) by mouth 2 (two) times daily. Patient not taking: No sig reported 09/09/18   Gertie Fey Bonham, PA  EPINEPHrine 0.3 mg/0.3 mL IJ SOAJ injection Inject 0.3 mg into the muscle once as needed (for allergic reaction).    [provider]  feeding supplement, ENSURE ENLIVE, (ENSURE ENLIVE) LIQD Take 237 mLs by mouth daily. Patient not taking: Reported on 12/07/2020 09/11/15   Jonetta Osgood, MD  methocarbamol (ROBAXIN) 500 MG tablet Take 1 tablet (500 mg total) by mouth every 6 (six) hours as needed for muscle spasms. Patient not taking: No sig reported 09/09/18   Brynda Peon, PA  oxyCODONE-acetaminophen (PERCOCET) 5-325 MG tablet Take 1-2 tablets by mouth every 6 (six) hours as needed. Patient not taking: No sig reported 08/31/18   Charlesetta Shanks, MD  pantoprazole (PROTONIX) 40 MG tablet Take 1 tablet (40 mg total) by mouth at bedtime. Patient not taking: No sig reported 09/11/15   Jonetta Osgood, MD  rosuvastatin (CRESTOR) 40 MG tablet Take 20 mg by mouth daily. Patient not taking: No sig reported    [provider]  traMADol (ULTRAM) 50 MG tablet Take 100 mg by mouth 4 (four) times daily as needed for moderate pain. Patient not taking: No sig reported    [provider]    Scheduled Meds: . aspirin  325 mg Oral Daily  . budesonide (PULMICORT) nebulizer solution  0.25 mg Nebulization BID  . enoxaparin (LOVENOX) injection  40 mg Subcutaneous Q24H  . FLUoxetine  80 mg Oral Daily  . guaiFENesin  600 mg Oral BID  . ipratropium-albuterol  3 mL Nebulization Q6H  . metoprolol succinate  12.5 mg Oral Daily  . traZODone  200 mg Oral QHS   Continuous Infusions: . ampicillin-sulbactam (UNASYN) IV 3 g (12/09/20 1222)  . lactated ringers 75 mL/hr at 12/09/20 1221   PRN Meds:.albuterol, traMADol     Results for orders placed or performed during the hospital encounter of 12/07/20 (from the past 48 hour(s))  HIV Antibody (routine testing w rflx)     Status: None   Collection Time: 12/08/20  7:05 AM  Result Value Ref Range   HIV Screen 4th Generation wRfx Non Reactive Non Reactive    Comment: Performed at [facility], [address] [place], [place] [address]  Comprehensive metabolic panel     Status: Abnormal   Collection Time: 12/08/20  7:05 AM  Result Value Ref Range   Sodium 143 135 - 145 mmol/L   Potassium 4.0 3.5 - 5.1  mmol/L   Chloride 106 98 - 111 mmol/L   CO2 25 22 - 32 mmol/L   Glucose, Bld 110 (H) 70 - 99 mg/dL    Comment: Glucose reference range applies only to samples taken after fasting for at least 8 hours.   BUN 31 (H) 8 - 23 mg/dL   Creatinine, Ser 1.06 (H) 0.44 - 1.00 mg/dL   Calcium 8.6 (L) 8.9 - 10.3 mg/dL   Total Protein 7.3 6.5 - 8.1 g/dL   Albumin 3.2 (L) 3.5 - 5.0 g/dL   AST 43 (H) 15 - 41 U/L   ALT 74 (H) 0 - 44 U/L   Alkaline Phosphatase 60 38 - 126 U/L   Total Bilirubin 0.7 0.3 - 1.2 mg/dL   GFR, Estimated 57 (L) >60 mL/min    Comment: (NOTE) Calculated using the CKD-EPI Creatinine Equation (2021)    Anion gap 12 5 - 15    Comment: Performed at [facility], [address], [place], [place] [address]  CBC     Status: Abnormal   Collection Time: 12/08/20  7:05 AM  Result Value Ref Range   WBC 10.1 4.0 - 10.5 K/uL   RBC 4.97 3.87 - 5.11 MIL/uL   Hemoglobin 15.2 (H) 12.0 - 15.0 g/dL   HCT 48.2 (H) 36.0 - 46.0 %   MCV 97.0 80.0 - 100.0 fL   MCH 30.6 26.0 - 34.0 pg   MCHC 31.5 30.0 - 36.0 g/dL   RDW 14.6 11.5 - 15.5 %   Platelets 272 150 - 400 K/uL   nRBC 0.0 0.0 - 0.2 %    Comment: Performed at [facility], [address] [place], [place] [address]  Glucose, capillary     Status: Abnormal   Collection Time: 12/08/20  3:57 PM  Result Value Ref Range   Glucose-Capillary 104 (H) 70 - 99 mg/dL    Comment: Glucose reference range applies only to samples taken after fasting for at least 8 hours.   Comment 1 Notify RN    Comment 2 Document in Chart     Studies/Results:   HEAD CT 09-2020 FINDINGS: Brain: Stable appearance of an anterior aneurysm clip with adjacent streak artifact. Stable region of cortically based hypoattenuation, likely encephalomalacia from prior infarct along the high right anterior parietal lobe. Remote lacunar infarct seen in the left basal ganglia. No  evidence of acute infarction, hemorrhage, hydrocephalus, extra-axial collection, visible mass  lesion or mass effect. Symmetric prominence of the ventricles, cisterns and sulci compatible with parenchymal volume loss. Patchy areas of white matter hypoattenuation are most compatible with chronic microvascular angiopathy.  Vascular: Anterior aneurysm clipping is again seen. Atherosclerotic calcification of the carotid siphons and intradural vertebral arteries. No hyperdense vessel.  Skull: No calvarial fracture or suspicious osseous lesion. No scalp swelling or hematoma.  Sinuses/Orbits: Paranasal sinuses and mastoid air cells are predominantly clear. Included orbital structures are unremarkable.  Other: None.  IMPRESSION: 1. No acute intracranial abnormality. 2. Prior anterior aneurysm clipping. 3. Stable encephalomalacia from prior infarct in the high right anterior parietal lobe. Remote lacunar infarct in the left basal ganglia. 4. Stable parenchymal volume loss and chronic microvascular Angiopathy.     MRI MRA BRAIN 11-2015 IMPRESSION: MRI HEAD IMPRESSION:  1. Patchy high signal intensity within the deep white matter of the bilateral centrum semi ovale, suspicious for possible small vessel ischemia. Please note that a portion of these findings main reflect T2 shine through/artifact from underlying chronic small vessel ischemic disease. Correlation with symptomatology and physical exam recommended. 2. Mild to moderate chronic small vessel ischemic disease.  MRA HEAD IMPRESSION:  1. No large or proximal arterial branch occlusion identified within the intracranial circulation. No high-grade or correctable stenosis identified. 2. 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 No residual aneurysm identified.    CERBRAL ANGIO 08-2014 1. Approximately 4 mm right A1-A2 junction aneurysm successfully  coiled, as described above. No aneurysm filling is seen  post-embolization.     Detravion Tester A. Merlene Mills, M.D.  Diplomate, Tax adviser of  Psychiatry and Neurology ( Neurology). 12/09/2020, 5:01 PM

## 2020-12-09 NOTE — Plan of Care (Signed)
  Problem: Acute Rehab PT Goals(only PT should resolve) Goal: Pt Will Go Supine/Side To Sit Outcome: Progressing Flowsheets (Taken 12/09/2020 1615) Pt will go Supine/Side to Sit: with supervision Goal: Patient Will Transfer Sit To/From Stand Outcome: Progressing Flowsheets (Taken 12/09/2020 1615) Patient will transfer sit to/from stand: with supervision Goal: Pt Will Transfer Bed To Chair/Chair To Bed Outcome: Progressing Flowsheets (Taken 12/09/2020 1615) Pt will Transfer Bed to Chair/Chair to Bed: with supervision Goal: Pt Will Ambulate Outcome: Progressing Flowsheets (Taken 12/09/2020 1615) Pt will Ambulate:  50 feet  with supervision  with cane   4:15 PM, 12/09/20 Lonell Grandchild, MPT Physical Therapist with [facility] [phone] office [phone] mobile phone

## 2020-12-09 NOTE — Progress Notes (Signed)
*  PRELIMINARY RESULTS* Echocardiogram 2D Echocardiogram has been performed.  Leavy Cella 12/09/2020, 11:41 AM

## 2020-12-09 NOTE — Care Management Important Message (Signed)
Important Message  Patient Details  Name: Mindy Mills MRN: [id number] Date of Birth: [birth year]   Medicare Important Message Given:  Yes     Tommy Medal 12/09/2020, 2:19 PM

## 2020-12-09 NOTE — Evaluation (Signed)
Physical Therapy Evaluation Patient Details Name: Mindy Mills MRN: [id number] DOB: Feb 03, 1951 Today's Date: 12/09/2020   History of Present Illness  Mindy Mills is a 69 y.o. 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  Patient has since had PEG tube and tracheostomy removed.  She reports that for the past 3 days, she has progressive shortness of breath and cough.  She denies any fever.  She denies any vomiting.  She has not felt as though she is been choking after eating or drinking.  She has not had any chest pain.  She feels increasingly weak.  No dysuria, diarrhea.  Since she had progressive shortness of breath, she called EMS for further evaluation    Clinical Impression  Patient demonstrates slow labored movement for sitting up at bedside, limited to a few slow labored unsteady steps at bedside mostly due to c/o dizziness, BP at 116/75, on room air with SpO2 dropping from 93% to 87%, put back on 2 LPM.  Patient tolerated sitting up in chair after therapy - RN notified.  Patient will benefit from continued physical therapy in hospital and recommended venue below to increase strength, balance, endurance for safe ADLs and gait.      Follow Up Recommendations SNF;Supervision for mobility/OOB;Supervision - Intermittent    Equipment Recommendations  Rolling walker with 5" wheels    Recommendations for Other Services       Precautions / Restrictions Precautions Precautions: Fall Restrictions Weight Bearing Restrictions: No      Mobility  Bed Mobility Overal bed mobility: Needs Assistance Bed Mobility: Supine to Sit     Supine to sit: Min assist     General bed mobility comments: increased time, labored movement    Transfers Overall transfer level: Needs assistance Equipment used: Straight cane Transfers: Stand Pivot Transfers;Sit to/from  Stand Sit to Stand: Min assist Stand pivot transfers: Min assist       General transfer comment: slow labored movement  Ambulation/Gait Ambulation/Gait assistance: Min assist Gait Distance (Feet): 4 Feet Assistive device: Straight cane Gait Pattern/deviations: Decreased step length - left;Decreased stride length Gait velocity: decreased   General Gait Details: limited to 4-5 slow labored unsteady steps at bedside mostly due to c/o dizziness, on room air with SpO2 dropping from 93% to 87%, put back on 2 LPM O2  Stairs            Wheelchair Mobility    Modified Rankin (Stroke Patients Only)       Balance Overall balance assessment: Needs assistance Sitting-balance support: Feet supported;No upper extremity supported Sitting balance-Leahy Scale: Fair Sitting balance - Comments: fair/good seated at EOB   Standing balance support: During functional activity;Single extremity supported Standing balance-Leahy Scale: Poor Standing balance comment: fair/poor using SPC                             Pertinent Vitals/Pain Pain Assessment: Faces Faces Pain Scale: Hurts little more Pain Location: migraine frontal headache Pain Descriptors / Indicators: Headache Pain Intervention(s): Limited activity within patient's tolerance;Monitored during session    Home Living Family/patient expects to be discharged to:: Private residence Living Arrangements: Non-relatives/Friends Available Help at Discharge: Friend(s);Available 24 hours/day Type of Home: Mobile home Home Access: Stairs to enter Entrance Stairs-Rails: Right;Left;Can reach both Entrance Stairs-Number of Steps: 4 Home Layout: One level Home Equipment: Cane - single point  Prior Function Level of Independence: Independent with assistive device(s)         Comments: Hydrographic surveyor using SPC, drives     Hand Dominance   Dominant Hand: Right    Extremity/Trunk Assessment   Upper Extremity  Assessment Upper Extremity Assessment: Generalized weakness    Lower Extremity Assessment Lower Extremity Assessment: Generalized weakness    Cervical / Trunk Assessment Cervical / Trunk Assessment: Normal  Communication   Communication: No difficulties  Cognition Arousal/Alertness: Awake/alert Behavior During Therapy: WFL for tasks assessed/performed Overall Cognitive Status: Within Functional Limits for tasks assessed                                        General Comments      Exercises     Assessment/Plan    PT Assessment Patient needs continued PT services  PT Problem List Decreased strength;Decreased activity tolerance;Decreased balance;Decreased mobility       PT Treatment Interventions DME instruction;Gait training;Stair training;Functional mobility training;Therapeutic activities;Therapeutic exercise;Patient/family education;Balance training    PT Goals (Current goals can be found in the Care Plan section)  Acute Rehab PT Goals Patient Stated Goal: return home with roommate to assist PT Goal Formulation: With patient Time For Goal Achievement: 12/23/20 Potential to Achieve Goals: Good    Frequency Min 3X/week   Barriers to discharge        Co-evaluation               AM-PAC PT "6 Clicks" Mobility  Outcome Measure Help needed turning from your back to your side while in a flat bed without using bedrails?: None Help needed moving from lying on your back to sitting on the side of a flat bed without using bedrails?: A Little Help needed moving to and from a bed to a chair (including a wheelchair)?: A Little Help needed standing up from a chair using your arms (e.g., wheelchair or bedside chair)?: A Little Help needed to walk in hospital room?: A Lot Help needed climbing 3-5 steps with a railing? : A Lot 6 Click Score: 17    End of Session Equipment Utilized During Treatment: Oxygen Activity Tolerance: Patient tolerated treatment  well;Patient limited by fatigue Patient left: in chair;with call bell/phone within reach Nurse Communication: Mobility status PT Visit Diagnosis: Other abnormalities of gait and mobility (R26.89);Muscle weakness (generalized) (M62.81);Unsteadiness on feet (R26.81)    Time: 2774-1287 PT Time Calculation (min) (ACUTE ONLY): 22 min   Charges:   PT Evaluation $PT Eval Moderate Complexity: 1 Mod PT Treatments $Therapeutic Activity: 8-22 mins        4:13 PM, 12/09/20 Lonell Grandchild, MPT Physical Therapist with [facility] [phone] office [phone] mobile phone

## 2020-12-09 NOTE — Progress Notes (Signed)
PROGRESS NOTE    Mindy Mills  WUJ:[id number] DOB: [birth year] DOA: 12/07/2020 PCP: Patient, No Pcp Per    Brief Narrative:  69 year old female admitted to the hospital with shortness of breath and weakness.  Found to have aspiration pneumonia.  Started on intravenous Unasyn.  She was also noted to have elevated troponin and EKG changes without any chest pain.  Felt to be demand ischemia in the setting of infection/hypoxia.  Echocardiogram has been ordered.   Assessment & Plan:   Active Problems:   Acute respiratory failure (HCC)   Aspiration pneumonia (HCC)   Elevated troponin   Acute respiratory failure with hypoxia -Secondary to pneumonia -Wean off oxygen as tolerated  Aspiration pneumonia -Noted to have pneumonia with debris in upper airways on CT scan -Started on Unasyn -Continue pulmonary hygiene with bronchodilators and mucolytic's -seen by speech therapy with recommendations for D3 diet  Acute systolic CHF -EF noted to be 25-30% -previous history of Takotsubo CM in 2015 -does not appear to be volume overloaded -cardiology following, appreciate input -started on toprol, can consider losartan pending BPs -may need further invasive work up like cardiac cath  Elevated troponin -Patient did not complain of any chest pain -She does have some EKG changes, mainly TWI in precordial leads -Troponin elevated at 333, delta troponin flat at 338 -Echo shows low EF -appreciate cardiology assistance -Continue on aspirin  Apical thrombus -noted on echo -will need to consider anticoagulation if cleared by neuro  History of [place] -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 -Patient had aneurysm coiled at that time -Neurology consulted to help determine safety of DAPT in case patient would need cardiac stents and also of anticoagulation for thrombus  Emphysema -noted on imaging -patient does have a history of tobacco use, quit smoking a year ago -continue  bronchodilators at this time  Generalized weakness -seen by physical therapy with recommendations for SNF   DVT prophylaxis: enoxaparin (LOVENOX) injection 40 mg Start: 12/07/20 2200  Code Status: Full code Family Communication: Discussed with patient Disposition Plan: Status is: Inpatient  Remains inpatient appropriate because:Ongoing diagnostic testing needed not appropriate for outpatient work up   Dispo: The patient is from: Home              Anticipated d/c is to: SNF              Anticipated d/c date is: 1 day              Patient currently is not medically stable to d/c.   Consultants:   cardiology  Procedures:   Echo: EF 25-30%, possible apical thrombus  Antimicrobials:   Unasyn 12/11 >   Subjective: Continues to have productive cough, although reports it is improving. She denies any chest pain  Objective: Vitals:   12/09/20 0723 12/09/20 1405 12/09/20 1413 12/09/20 1645  BP:   115/65 117/73  Pulse:   97 88  Resp:   18 18  Temp:   97.9 F (36.6 C) 98.3 F (36.8 C)  TempSrc:   Oral Oral  SpO2: 96% 96% 91% 94%  Weight:      Height:        Intake/Output Summary (Last 24 hours) at 12/09/2020 1722 Last data filed at 12/09/2020 1400 Gross per 24 hour  Intake 480 ml  Output --  Net 480 ml   Filed Weights   12/07/20 0941  Weight: 47.2 kg    Examination:  General exam: Alert, awake, oriented x 3  Respiratory system: less rhonchi today. Respiratory effort normal. Cardiovascular system:RRR. No murmurs, rubs, gallops. Gastrointestinal system: Abdomen is nondistended, soft and nontender. No organomegaly or masses felt. Normal bowel sounds heard. Central nervous system: Alert and oriented. No focal neurological deficits. Extremities: No C/C/E, +pedal pulses Skin: No rashes, lesions or ulcers Psychiatry: Judgement and insight appear normal. Mood & affect appropriate.      Data Reviewed: I have personally reviewed following labs and imaging  studies  CBC: Recent Labs  Lab 12/07/20 0953 12/08/20 0705  WBC 14.9* 10.1  NEUTROABS 12.1*  --   HGB 16.8* 15.2*  HCT 53.1* 48.2*  MCV 96.0 97.0  PLT 319 597   Basic Metabolic Panel: Recent Labs  Lab 12/07/20 0953 12/08/20 0705  NA 144 143  K 4.0 4.0  CL 106 106  CO2 25 25  GLUCOSE 118* 110*  BUN 44* 31*  CREATININE 1.22* 1.06*  CALCIUM 9.2 8.6*   GFR: Estimated Creatinine Clearance: 37.8 mL/min (A) (by C-G formula based on SCr of 1.06 mg/dL (H)). Liver Function Tests: Recent Labs  Lab 12/07/20 0953 12/08/20 0705  AST 66* 43*  ALT 95* 74*  ALKPHOS 72 60  BILITOT 0.7 0.7  PROT 8.0 7.3  ALBUMIN 3.5 3.2*   No results for input(s): LIPASE, AMYLASE in the last 168 hours. No results for input(s): AMMONIA in the last 168 hours. Coagulation Profile: No results for input(s): INR, PROTIME in the last 168 hours. Cardiac Enzymes: Recent Labs  Lab 12/07/20 1604  CKTOTAL 348*   BNP (last 3 results) No results for input(s): PROBNP in the last 8760 hours. HbA1C: No results for input(s): HGBA1C in the last 72 hours. CBG: Recent Labs  Lab 12/08/20 1557  GLUCAP 104*   Lipid Profile: Recent Labs    12/07/20 0953  TRIG 193*   Thyroid Function Tests: No results for input(s): TSH, T4TOTAL, FREET4, T3FREE, THYROIDAB in the last 72 hours. Anemia Panel: Recent Labs    12/07/20 0953  FERRITIN 85   Sepsis Labs: Recent Labs  Lab 12/07/20 4163 12/07/20 0953 12/07/20 1132  PROCALCITON  --  0.48  --   LATICACIDVEN 1.8  --  1.5    Recent Results (from the past 240 hour(s))  Resp Panel by RT-PCR (Flu A&B, Covid) Nasopharyngeal Swab     Status: None   Collection Time: 12/07/20  9:37 AM   Specimen: Nasopharyngeal Swab; Nasopharyngeal(NP) swabs in vial transport medium  Result Value Ref Range Status   SARS Coronavirus 2 by RT PCR NEGATIVE NEGATIVE Final    Comment: (NOTE) SARS-CoV-2 target nucleic acids are NOT DETECTED.  The SARS-CoV-2 RNA is generally  detectable in upper respiratory specimens during the acute phase of infection. The lowest concentration of SARS-CoV-2 viral copies this assay can detect is 138 copies/mL. A negative result does not preclude SARS-Cov-2 infection and should not be used as the sole basis for treatment or other patient management decisions. A negative result may occur with  improper specimen collection/handling, submission of specimen other than nasopharyngeal swab, presence of viral mutation(s) within the areas targeted by this assay, and inadequate number of viral copies(<138 copies/mL). A negative result must be combined with clinical observations, patient history, and epidemiological information. The expected result is Negative.  Fact Sheet for Patients:  EntrepreneurPulse.com.au  Fact Sheet for Healthcare Providers:  IncredibleEmployment.be  This test is no t yet approved or cleared by the [place] FDA and  has been authorized for detection and/or diagnosis of SARS-CoV-2 by FDA  under an Emergency Use Authorization (EUA). This EUA will remain  in effect (meaning this test can be used) for the duration of the COVID-19 declaration under Section 564(b)(1) of the Act, 21 U.S.C.section 360bbb-3(b)(1), unless the authorization is terminated  or revoked sooner.       Influenza A by PCR NEGATIVE NEGATIVE Final   Influenza B by PCR NEGATIVE NEGATIVE Final    Comment: (NOTE) The Xpert Xpress SARS-CoV-2/FLU/RSV plus assay is intended as an aid in the diagnosis of influenza from Nasopharyngeal swab specimens and should not be used as a sole basis for treatment. Nasal washings and aspirates are unacceptable for Xpert Xpress SARS-CoV-2/FLU/RSV testing.  Fact Sheet for Patients: EntrepreneurPulse.com.au  Fact Sheet for Healthcare Providers: IncredibleEmployment.be  This test is not yet approved or cleared by the [place] FDA  and has been authorized for detection and/or diagnosis of SARS-CoV-2 by FDA under an Emergency Use Authorization (EUA). This EUA will remain in effect (meaning this test can be used) for the duration of the COVID-19 declaration under Section 564(b)(1) of the Act, 21 U.S.C. section 360bbb-3(b)(1), unless the authorization is terminated or revoked.  Performed at [facility], [address], [place], [place] [address]   Blood Culture (routine x 2)     Status: None (Preliminary result)   Collection Time: 12/07/20  9:53 AM   Specimen: Right Antecubital; Blood  Result Value Ref Range Status   Specimen Description   Final    RIGHT ANTECUBITAL BOTTLES DRAWN AEROBIC AND ANAEROBIC   Special Requests Blood Culture adequate volume  Final   Culture   Final    NO GROWTH 2 DAYS Performed at [facility], [address] [place], [place] [address]    Report Status PENDING  Incomplete  Blood Culture (routine x 2)     Status: None (Preliminary result)   Collection Time: 12/07/20 11:22 AM   Specimen: Right Antecubital; Blood  Result Value Ref Range Status   Specimen Description   Final    RIGHT ANTECUBITAL BOTTLES DRAWN AEROBIC AND ANAEROBIC   Special Requests Blood Culture adequate volume  Final   Culture   Final    NO GROWTH 2 DAYS Performed at [facility], [address], [place], [place] [address]    Report Status PENDING  Incomplete         Radiology Studies: ECHOCARDIOGRAM COMPLETE  Result Date: 12/09/2020    ECHOCARDIOGRAM REPORT   Patient Name:   TUNISHA RULAND Date of Exam: 12/09/2020 Medical Rec #:  [id number]        Height:       63.0 in Accession #:    [id number]       Weight:       104.0 lb Date of Birth:  [birth year]         BSA:          1.464 m Patient Age:    85 years         BP:           115/90 mmHg Patient Gender: F                HR:           94 bpm. Exam Location:  [facility] Procedure: 2D Echo Indications:    Elevated Troponin  History:        Patient has prior history  of Echocardiogram examinations, most  recent 09/10/2015. TIA; Risk Factors:Former Smoker.                 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  Sonographer:    Leavy Cella RDCS (AE) Referring Phys: [facility]  1. The mid to distal anteroseptal,anterior and anterolateral walls are hypokinetic. The apex is hypokinetic. There is a 0.7 x 1.3 cm mural apical thrombus. . Left ventricular ejection fraction, by estimation, is 25 to 30%. The left ventricle has severely decreased function. The left ventricle demonstrates regional wall motion abnormalities (see scoring diagram/findings for description). There is moderate left ventricular hypertrophy. Left ventricular diastolic parameters are consistent with Grade II diastolic dysfunction (pseudonormalization). Elevated left atrial pressure.  2. Right ventricular systolic function is normal. The right ventricular size is normal.  3. Left atrial size was mildly dilated.  4. The mitral valve is normal in structure. Trivial mitral valve regurgitation. No evidence of mitral stenosis. Moderate mitral annular calcification.  5. The aortic valve was not well visualized. There is mild calcification of the aortic valve. There is mild thickening of the aortic valve. Aortic valve regurgitation is not visualized. No aortic stenosis is present.  6. The inferior vena cava is normal in size with greater than 50% respiratory variability, suggesting right atrial pressure of 3 mmHg. FINDINGS  Left Ventricle: The mid to distal anteroseptal,anterior and anterolateral walls are hypokinetic. The apex is hypokinetic. There is a 0.7 x 1.3 cm mural apical thrombus. Left ventricular ejection fraction, by estimation, is 25 to 30%. The left ventricle has severely decreased function. The left ventricle  demonstrates regional wall motion abnormalities. The left ventricular internal cavity size was normal in size. There is moderate left ventricular hypertrophy. Left ventricular diastolic parameters are consistent with Grade II diastolic dysfunction (pseudonormalization). Elevated left atrial pressure. Right Ventricle: The right ventricular size is normal. No increase in right ventricular wall thickness. Right ventricular systolic function is normal. Left Atrium: Left atrial size was mildly dilated. Right Atrium: Right atrial size was normal in size. Pericardium: There is no evidence of pericardial effusion. Mitral Valve: The mitral valve is normal in structure. There is mild thickening of the mitral valve leaflet(s). There is mild calcification of the mitral valve leaflet(s). Moderate mitral annular calcification. Trivial mitral valve regurgitation. No evidence of mitral valve stenosis. Tricuspid Valve: The tricuspid valve is normal in structure. Tricuspid valve regurgitation is mild . No evidence of tricuspid stenosis. Aortic Valve: The aortic valve was not well visualized. There is mild calcification of the aortic valve. There is mild thickening of the aortic valve. There is mild aortic valve annular calcification. Aortic valve regurgitation is not visualized. No aortic stenosis is present. Aortic valve mean gradient measures 2.8 mmHg. Aortic valve peak gradient measures 5.0 mmHg. Aortic valve area, by VTI measures 1.84 cm. Pulmonic Valve: The pulmonic valve was not well visualized. Pulmonic valve regurgitation is not visualized. No evidence of pulmonic stenosis. Aorta: The aortic root is normal in size and structure. Pulmonary Artery: Indeterminant PASP, inadequate TR jet. Venous: The inferior vena cava is normal in size with greater than 50% respiratory variability, suggesting right atrial pressure of 3 mmHg. IAS/Shunts: No atrial level shunt detected by color flow  Doppler.  LEFT VENTRICLE PLAX 2D LVIDd:          3.41 cm  Diastology LVIDs:         2.51 cm  LV e' medial:    3.37 cm/s LV PW:         1.28 cm  LV E/e' medial:  20.7 LV IVS:        1.45 cm  LV e' lateral:   4.90 cm/s LVOT diam:     1.90 cm  LV E/e' lateral: 14.2 LV SV:         29 LV SV Index:   20 LVOT Area:     2.84 cm  RIGHT VENTRICLE RV S prime:     7.18 cm/s TAPSE (M-mode): 1.7 cm LEFT ATRIUM             Index       RIGHT ATRIUM           Index LA diam:        3.60 cm 2.46 cm/m  RA Area:     13.90 cm LA Vol (A2C):   52.9 ml 36.12 ml/m RA Volume:   41.30 ml  28.20 ml/m LA Vol (A4C):   43.9 ml 29.98 ml/m LA Biplane Vol: 48.1 ml 32.84 ml/m  AORTIC VALVE AV Area (Vmax):    1.83 cm AV Area (Vmean):   1.61 cm AV Area (VTI):     1.84 cm AV Vmax:           111.69 cm/s AV Vmean:          80.592 cm/s AV VTI:            0.159 m AV Peak Grad:      5.0 mmHg AV Mean Grad:      2.8 mmHg LVOT Vmax:         71.98 cm/s LVOT Vmean:        45.672 cm/s LVOT VTI:          0.103 m LVOT/AV VTI ratio: 0.65  AORTA Ao Root diam: 2.90 cm MITRAL VALVE               TRICUSPID VALVE MV Area (PHT): 4.63 cm    TR Peak grad:   37.0 mmHg MV Decel Time: 164 msec    TR Vmax:        304.00 cm/s MV E velocity: 69.80 cm/s MV A velocity: 41.10 cm/s  SHUNTS MV E/A ratio:  1.70        Systemic VTI:  0.10 m                            Systemic Diam: 1.90 cm Carlyle Dolly MD Electronically signed by Carlyle Dolly MD Signature Date/Time: 12/09/2020/12:44:45 PM    Final         Scheduled Meds: . aspirin  325 mg Oral Daily  . budesonide (PULMICORT) nebulizer solution  0.25 mg Nebulization BID  . enoxaparin (LOVENOX) injection  40 mg Subcutaneous Q24H  . FLUoxetine  80 mg Oral Daily  . guaiFENesin  600 mg Oral BID  . ipratropium-albuterol  3 mL Nebulization Q6H  . metoprolol succinate  12.5 mg Oral Daily  . traZODone  200 mg Oral QHS   Continuous Infusions: . ampicillin-sulbactam (UNASYN) IV 3 g (12/09/20 1222)  . lactated ringers 75 mL/hr at 12/09/20 1221     LOS: 2 days     Time spent: 32mins  Kathie Dike, MD [facility]   If 7PM-7AM, please contact night-coverage [email or web address]  12/09/2020, 5:22 PM

## 2020-12-09 NOTE — Consult Note (Signed)
Cardiology Consultation:   Patient ID: Mindy Mills MRN: [id number]; DOB: Sep 28, 1951  Admit date: 12/07/2020 Date of Consult: 12/09/2020  Primary Care Provider: Patient, No Pcp Per [facility] Cardiologist: [place] Electrophysiologist:  None    Patient Profile:   Mindy Mills is a 69 y.o. female with a hx of prior systolic dysfunction that noramlized, SAH,  who is being seen today for the evaluation of SOB at the request of Dr Roderic Palau.  History of Present Illness:   Mindy Mills 69 yo female prior history of systolic HF LVEF 40-98% in 2015 that noramlized (Takotsubo wall motion pattern at that time in the setting of subarachnoid hemorrage and seizure) presented with SOB and cough. Being treated for aspiration pneumonia by primary team. As part of work up echo was obtained which showed LVEF down to 25-30% with WMAs. Patient denies any chest pain, no LE edema. Ongoign SOB and productive cough.    Lactic acid 1.8 WBC 14.9 Plt 319 Hgb 16.8 K 4 Cr 1.22 BUN 44 Ddimer 5 Procalcitonin 0.48 CRP 14.5 CK 348  Trop 333-->338--> COVID Neg CXR no disease CT PE: no PE, severe emphsema, RLL aspiration changes EKG SR,diffuse precordial lead TWIs.  Echo LVEF 25-30%, apical hypokinesis, apical thrombus  Past Medical History:  Diagnosis Date  . 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 Full dentures   . Hepatitis C    denies  . Hypertension   . Migraine   . Myocardial infarction ([place])   . 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  Last one in 2015  . Wears glasses   . Wears hearing aid in both ears     Past Surgical History:  Procedure Laterality Date  . ABDOMINAL HYSTERECTOMY    . BUNIONECTOMY    . COLONOSCOPY    . MULTIPLE TOOTH EXTRACTIONS    . OPEN REDUCTION INTERNAL FIXATION (ORIF) DISTAL RADIAL FRACTURE Right 09/07/2018   Procedure: OPEN REDUCTION INTERNAL FIXATION (ORIF) DISTAL RADIAL FRACTURE;  Surgeon: Iran Planas, MD;  Location: [place];  Service: Orthopedics;   Laterality: Right;  . PEG PLACEMENT N/A 09/21/2014   Procedure: PERCUTANEOUS ENDOSCOPIC GASTROSTOMY (PEG) PLACEMENT (BED SIDE);  Surgeon: Georganna Skeans, MD;  Location: [place];  Service: General;  Laterality: N/A;  . RADIOLOGY WITH ANESTHESIA N/A 09/11/2014   Procedure: Sub Arachnoid Aneurysm Coiling -RADIOLOGY WITH ANESTHESIA ;  Surgeon: Consuella Lose, MD;  Location: [place];  Service: Radiology;  Laterality: N/A;      Inpatient Medications: Scheduled Meds: . aspirin  325 mg Oral Daily  . budesonide (PULMICORT) nebulizer solution  0.25 mg Nebulization BID  . enoxaparin (LOVENOX) injection  40 mg Subcutaneous Q24H  . FLUoxetine  80 mg Oral Daily  . guaiFENesin  600 mg Oral BID  . ipratropium-albuterol  3 mL Nebulization Q6H  . traZODone  200 mg Oral QHS   Continuous Infusions: . ampicillin-sulbactam (UNASYN) IV 3 g (12/09/20 1222)  . lactated ringers 75 mL/hr at 12/09/20 1221   PRN Meds: albuterol  Allergies:    Allergies  Allergen Reactions  . Bee Venom Anaphylaxis and Hives  . Capsicum Gretta Arab) [[place]] Swelling    Social History:   Social History   Socioeconomic History  . Marital status: Single    Spouse name: Not on file  . Number of children: Not on file  . Years of education: Not on file  . Highest education level: Not on file  Occupational History  . Not on  file  Tobacco Use  . Smoking status: Former Smoker    Packs/day: 0.50    Types: Cigarettes    Quit date: 09/06/2015    Years since quitting: 5.2  . Smokeless tobacco: Never Used  Vaping Use  . Vaping Use: Former  Substance and Sexual Activity  . Alcohol use: Not Currently  . Drug use: No  . Sexual activity: Not Currently  Other Topics Concern  . Not on file  Social History Narrative  . Not on file   Social Determinants of Health   Financial Resource Strain: Not on file  Food Insecurity: Not on file  Transportation Needs: Not on file  Physical Activity: Not on file  Stress: Not on  file  Social Connections: Not on file  Intimate Partner Violence: Not on file    Family History:    Family History  Problem Relation Age of Onset  . Alcohol abuse Father      ROS:  Please see the history of present illness.   All other ROS reviewed and negative.     Physical Exam/Data:   Vitals:   12/09/20 0718 12/09/20 0723 12/09/20 1405 12/09/20 1413  BP:    115/65  Pulse:    97  Resp:    18  Temp:    97.9 F (36.6 C)  TempSrc:    Oral  SpO2: 96% 96% 96% 91%  Weight:      Height:        Intake/Output Summary (Last 24 hours) at 12/09/2020 1441 Last data filed at 12/09/2020 1019 Gross per 24 hour  Intake 1088.75 ml  Output --  Net 1088.75 ml   Last 3 Weights 12/07/2020 10/10/2020 09/07/2018  Weight (lbs) 104 lb 105 lb 13.1 oz 109 lb  Weight (kg) 47.174 kg 48 kg 49.442 kg     Body mass index is 18.42 kg/m.  General:  Well nourished, well developed, in no acute distress HEENT: normal Lymph: no adenopathy Neck: no JVD Endocrine:  No thryomegaly Vascular: No carotid bruits; FA pulses 2+ bilaterally without bruits  Cardiac:  normal S1, S2; RRR; no murmur  Lungs:  clear to auscultation bilaterally, no wheezing, rhonchi or rales  Abd: soft, nontender, no hepatomegaly  Ext: no edema Musculoskeletal:  No deformities, BUE and BLE strength normal and equal Skin: warm and dry  Neuro:  CNs 2-12 intact, no focal abnormalities noted Psych:  Normal affect   Laboratory Data:  High Sensitivity Troponin:   Recent Labs  Lab 12/07/20 1132 12/07/20 1604  TROPONINIHS 333* 338*     Chemistry Recent Labs  Lab 12/07/20 0953 12/08/20 0705  NA 144 143  K 4.0 4.0  CL 106 106  CO2 25 25  GLUCOSE 118* 110*  BUN 44* 31*  CREATININE 1.22* 1.06*  CALCIUM 9.2 8.6*  GFRNONAA 48* 57*  ANIONGAP 13 12    Recent Labs  Lab 12/07/20 0953 12/08/20 0705  PROT 8.0 7.3  ALBUMIN 3.5 3.2*  AST 66* 43*  ALT 95* 74*  ALKPHOS 72 60  BILITOT 0.7 0.7   Hematology Recent Labs   Lab 12/07/20 0953 12/08/20 0705  WBC 14.9* 10.1  RBC 5.53* 4.97  HGB 16.8* 15.2*  HCT 53.1* 48.2*  MCV 96.0 97.0  MCH 30.4 30.6  MCHC 31.6 31.5  RDW 14.6 14.6  PLT 319 272   BNPNo results for input(s): BNP, PROBNP in the last 168 hours.  DDimer  Recent Labs  Lab 12/07/20 0953  DDIMER 5.00*  Radiology/Studies:  CT Angio Chest PE W/Cm &/Or Wo Cm  Result Date: 12/07/2020 CLINICAL DATA:  Chest pain for several days and elevated D-dimer. EXAM: CT ANGIOGRAPHY CHEST WITH CONTRAST TECHNIQUE: Multidetector CT imaging of the chest was performed using the standard protocol during bolus administration of intravenous contrast. Multiplanar CT image reconstructions and MIPs were obtained to evaluate the vascular anatomy. CONTRAST:  48mL OMNIPAQUE IOHEXOL 350 MG/ML SOLN COMPARISON:  None. FINDINGS: Cardiovascular: The heart is borderline enlarged for age. No pericardial effusion. Significant age advanced atherosclerotic calcifications involving the thoracic aorta and Baraka Klatt vessels. Three-vessel coronary artery calcifications are noted. The pulmonary arterial tree is well opacified. No filling defects to suggest pulmonary embolism. Mediastinum/Nodes: Borderline right hilar lymph nodes likely reactive/inflammatory. No mediastinal adenopathy. There is a large hiatal hernia. Lungs/Pleura: Severe emphysematous changes and pulmonary scarring. Extensive fluid and debris filling the right lower lobe bronchus and its branches highly suggestive acute aspiration. There is also mild peribronchial thickening and peribronchial inflammatory changes. Right lower lobe atelectasis is noted adjacent to the hiatal hernia. No pleural effusions. No worrisome pulmonary lesions. Upper Abdomen: Chronic appearing left-sided hydronephrosis with fairly significant renal cortical thinning. Aortic and Eriana Suliman vessel calcifications are noted. Low-attenuation left adrenal gland lesion, likely benign adenoma. Musculoskeletal: No  breast masses, supraclavicular or axillary adenopathy. The bony thorax is intact. No worrisome bone lesions or acute fractures. Review of the MIP images confirms the above findings. IMPRESSION: 1. No CT findings for pulmonary embolism. 2. Significant age advanced atherosclerotic calcifications involving the thoracic aorta and Maddix Heinz vessels including the coronary arteries. 3. Severe emphysematous changes and pulmonary scarring. 4. Extensive fluid and debris filling the right lower lobe bronchus and its branches highly suggestive of acute aspiration. There is also mild peribronchial thickening and peribronchial inflammatory changes. 5. Large hiatal hernia. 6. Chronic appearing left-sided hydronephrosis with fairly significant renal cortical thinning. 7. Emphysema and aortic atherosclerosis. Aortic Atherosclerosis (ICD10-I70.0) and Emphysema (ICD10-J43.9). Electronically Signed   By: Marijo Sanes M.D.   On: 12/07/2020 12:33   DG Chest Port 1 View  Result Date: 12/07/2020 CLINICAL DATA:  cough EXAM: PORTABLE CHEST 1 VIEW COMPARISON:  10/10/2020 and prior. FINDINGS: No focal consolidation. No pneumothorax or pleural effusion. Stable cardiomediastinal silhouette including moderate hiatal hernia. Aortic atherosclerotic calcifications. No acute osseous abnormality. IMPRESSION: No focal airspace disease. Electronically Signed   By: Primitivo Gauze M.D.   On: 12/07/2020 10:12   ECHOCARDIOGRAM COMPLETE  Result Date: 12/09/2020    ECHOCARDIOGRAM REPORT   Patient Name:   DARETH ANDREW Date of Exam: 12/09/2020 Medical Rec #:  [id number]        Height:       63.0 in Accession #:    [id number]       Weight:       104.0 lb Date of Birth:  Dec 04, 1951         BSA:          1.464 m Patient Age:    53 years         BP:           115/90 mmHg Patient Gender: F                HR:           94 bpm. Exam Location:  [facility] Procedure: 2D Echo Indications:    Elevated Troponin  History:        Patient has prior history of  Echocardiogram examinations, most  recent 09/10/2015. TIA; Risk Factors:Former Smoker.                 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  Sonographer:    Leavy Cella RDCS (AE) Referring Phys: [place]  1. The mid to distal anteroseptal,anterior and anterolateral walls are hypokinetic. The apex is hypokinetic. There is a 0.7 x 1.3 cm mural apical thrombus. . Left ventricular ejection fraction, by estimation, is 25 to 30%. The left ventricle has severely decreased function. The left ventricle demonstrates regional wall motion abnormalities (see scoring diagram/findings for description). There is moderate left ventricular hypertrophy. Left ventricular diastolic parameters are consistent with Grade II diastolic dysfunction (pseudonormalization). Elevated left atrial pressure.  2. Right ventricular systolic function is normal. The right ventricular size is normal.  3. Left atrial size was mildly dilated.  4. The mitral valve is normal in structure. Trivial mitral valve regurgitation. No evidence of mitral stenosis. Moderate mitral annular calcification.  5. The aortic valve was not well visualized. There is mild calcification of the aortic valve. There is mild thickening of the aortic valve. Aortic valve regurgitation is not visualized. No aortic stenosis is present.  6. The inferior vena cava is normal in size with greater than 50% respiratory variability, suggesting right atrial pressure of 3 mmHg. FINDINGS  Left Ventricle: The mid to distal anteroseptal,anterior and anterolateral walls are hypokinetic. The apex is hypokinetic. There is a 0.7 x 1.3 cm mural apical thrombus. Left ventricular ejection fraction, by estimation, is 25 to 30%. The left ventricle has severely decreased function. The left ventricle demonstrates  regional wall motion abnormalities. The left ventricular internal cavity size was normal in size. There is moderate left ventricular hypertrophy. Left ventricular diastolic parameters are consistent with Grade II diastolic dysfunction (pseudonormalization). Elevated left atrial pressure. Right Ventricle: The right ventricular size is normal. No increase in right ventricular wall thickness. Right ventricular systolic function is normal. Left Atrium: Left atrial size was mildly dilated. Right Atrium: Right atrial size was normal in size. Pericardium: There is no evidence of pericardial effusion. Mitral Valve: The mitral valve is normal in structure. There is mild thickening of the mitral valve leaflet(s). There is mild calcification of the mitral valve leaflet(s). Moderate mitral annular calcification. Trivial mitral valve regurgitation. No evidence of mitral valve stenosis. Tricuspid Valve: The tricuspid valve is normal in structure. Tricuspid valve regurgitation is mild . No evidence of tricuspid stenosis. Aortic Valve: The aortic valve was not well visualized. There is mild calcification of the aortic valve. There is mild thickening of the aortic valve. There is mild aortic valve annular calcification. Aortic valve regurgitation is not visualized. No aortic stenosis is present. Aortic valve mean gradient measures 2.8 mmHg. Aortic valve peak gradient measures 5.0 mmHg. Aortic valve area, by VTI measures 1.84 cm. Pulmonic Valve: The pulmonic valve was not well visualized. Pulmonic valve regurgitation is not visualized. No evidence of pulmonic stenosis. Aorta: The aortic root is normal in size and structure. Pulmonary Artery: Indeterminant PASP, inadequate TR jet. Venous: The inferior vena cava is normal in size with greater than 50% respiratory variability, suggesting right atrial pressure of 3 mmHg. IAS/Shunts: No atrial level shunt detected by color flow  Doppler.  LEFT VENTRICLE PLAX 2D LVIDd:         3.41 cm   Diastology LVIDs:         2.51 cm  LV e' medial:    3.37 cm/s LV PW:         1.28 cm  LV E/e' medial:  20.7 LV IVS:        1.45 cm  LV e' lateral:   4.90 cm/s LVOT diam:     1.90 cm  LV E/e' lateral: 14.2 LV SV:         29 LV SV Index:   20 LVOT Area:     2.84 cm  RIGHT VENTRICLE RV S prime:     7.18 cm/s TAPSE (M-mode): 1.7 cm LEFT ATRIUM             Index       RIGHT ATRIUM           Index LA diam:        3.60 cm 2.46 cm/m  RA Area:     13.90 cm LA Vol (A2C):   52.9 ml 36.12 ml/m RA Volume:   41.30 ml  28.20 ml/m LA Vol (A4C):   43.9 ml 29.98 ml/m LA Biplane Vol: 48.1 ml 32.84 ml/m  AORTIC VALVE AV Area (Vmax):    1.83 cm AV Area (Vmean):   1.61 cm AV Area (VTI):     1.84 cm AV Vmax:           111.69 cm/s AV Vmean:          80.592 cm/s AV VTI:            0.159 m AV Peak Grad:      5.0 mmHg AV Mean Grad:      2.8 mmHg LVOT Vmax:         71.98 cm/s LVOT Vmean:        45.672 cm/s LVOT VTI:          0.103 m LVOT/AV VTI ratio: 0.65  AORTA Ao Root diam: 2.90 cm MITRAL VALVE               TRICUSPID VALVE MV Area (PHT): 4.63 cm    TR Peak grad:   37.0 mmHg MV Decel Time: 164 msec    TR Vmax:        304.00 cm/s MV E velocity: 69.80 cm/s MV A velocity: 41.10 cm/s  SHUNTS MV E/A ratio:  1.70        Systemic VTI:  0.10 m                            Systemic Diam: 1.90 cm Carlyle Dolly MD Electronically signed by Carlyle Dolly MD Signature Date/Time: 12/09/2020/12:44:45 PM    Final      Assessment and Plan:   1. Acute systolic HF - history of systolic dysfunction in 7824 Takotsubo CM pattern in setting of SAH and seizure, LVEF later normalized - presents with pneumonia, LVEF found to be decreased again to 25-30% in a pattern also potentially suggestive of possible recurrent Takotsubo CM - does not appear volume overloaded by exam. She denies any edema, no chest pain - start toprol 12.5mg  daily, pending bp's likely low dose losartan tomorrow.  - does not appear volume overloaded, would not diurese, f/u  on BNP  2. Elevated troponin - flat trop around 300, EKG with precordial TWIs. Denies any chest pains -  could be demand ischemia related to pneumonia. Can also see elevated trops in setting of stress induced CM. Cannot exlcude ischemia.  - repeat trop - would need clearance to consider DAPT prior to committing to a cath. Would also need to be further recovered from her pneumonia.    3. Pneumonia - possible aspiration pneumonia, management per primrary team   4. Apical thrombus - noted on echo this admission in setting of apical hypokinesis - will need neuro clearance to consider anticoag given history of prior SAH - if cleared would initiate heparin, later coumadin pending invasive procedures.   5. History of SAH - in 2015 had aneurysmal bleed - will need neuro clearance to consider either anticoag for thrombus or DAPT if she potentially has a cath this admit     For questions or updates, please contact [place] Please consult [email or web address] for contact info under    Signed, Carlyle Dolly, MD  12/09/2020 2:41 PM

## 2020-12-10 LAB — CBC
HCT: 44 % (ref 36.0–46.0)
Hemoglobin: 13.6 g/dL (ref 12.0–15.0)
MCH: 30 pg (ref 26.0–34.0)
MCHC: 30.9 g/dL (ref 30.0–36.0)
MCV: 97.1 fL (ref 80.0–100.0)
Platelets: 246 10*3/uL (ref 150–400)
RBC: 4.53 MIL/uL (ref 3.87–5.11)
RDW: 14.2 % (ref 11.5–15.5)
WBC: 9.5 10*3/uL (ref 4.0–10.5)
nRBC: 0 % (ref 0.0–0.2)

## 2020-12-10 LAB — BASIC METABOLIC PANEL
Anion gap: 6 (ref 5–15)
BUN: 16 mg/dL (ref 8–23)
CO2: 29 mmol/L (ref 22–32)
Calcium: 7.9 mg/dL — ABNORMAL LOW (ref 8.9–10.3)
Chloride: 108 mmol/L (ref 98–111)
Creatinine, Ser: 1.04 mg/dL — ABNORMAL HIGH (ref 0.44–1.00)
GFR, Estimated: 59 mL/min — ABNORMAL LOW (ref >60)
Glucose, Bld: 87 mg/dL (ref 70–99)
Potassium: 3.7 mmol/L (ref 3.5–5.1)
Sodium: 143 mmol/L (ref 135–145)

## 2020-12-10 LAB — HEPARIN LEVEL (UNFRACTIONATED)
Heparin Unfractionated: 0.98 IU/mL — ABNORMAL HIGH (ref 0.30–0.70)
Heparin Unfractionated: 0.21 IU/mL — ABNORMAL LOW (ref 0.30–0.70)

## 2020-12-10 LAB — TROPONIN I (HIGH SENSITIVITY): Troponin I (High Sensitivity): 188 ng/L (ref <18)

## 2020-12-10 LAB — BRAIN NATRIURETIC PEPTIDE: B Natriuretic Peptide: 2426 pg/mL — ABNORMAL HIGH (ref 0.0–100.0)

## 2020-12-10 MED ORDER — AMOXICILLIN-POT CLAVULANATE 875-125 MG PO TABS
1.0000 | ORAL_TABLET | Freq: Two times a day (BID) | ORAL | Status: DC
  Administered 2020-12-10 – 2020-12-12 (×5): 1 via ORAL
  Filled 2020-12-10 (×6): qty 1

## 2020-12-10 MED ORDER — LOSARTAN POTASSIUM 25 MG PO TABS
12.5000 mg | ORAL_TABLET | Freq: Every day | ORAL | Status: DC
  Administered 2020-12-10 – 2020-12-12 (×3): 12.5 mg via ORAL
  Filled 2020-12-10 (×4): qty 1

## 2020-12-10 MED ORDER — HEPARIN BOLUS VIA INFUSION
2800.0000 [IU] | Freq: Once | INTRAVENOUS | Status: AC
  Administered 2020-12-10: 11:00:00 2800 [IU] via INTRAVENOUS
  Filled 2020-12-10: qty 2800

## 2020-12-10 MED ORDER — INFLUENZA VAC A&B SA ADJ QUAD 0.5 ML IM PRSY
0.5000 mL | PREFILLED_SYRINGE | INTRAMUSCULAR | Status: AC
  Administered 2020-12-11: 11:00:00 0.5 mL via INTRAMUSCULAR
  Filled 2020-12-10: qty 0.5

## 2020-12-10 MED ORDER — PNEUMOCOCCAL VAC POLYVALENT 25 MCG/0.5ML IJ INJ
0.5000 mL | INJECTION | INTRAMUSCULAR | Status: AC
  Administered 2020-12-11: 11:00:00 0.5 mL via INTRAMUSCULAR
  Filled 2020-12-10: qty 0.5

## 2020-12-10 MED ORDER — FUROSEMIDE 10 MG/ML IJ SOLN
40.0000 mg | Freq: Every day | INTRAMUSCULAR | Status: AC
  Administered 2020-12-10 – 2020-12-12 (×3): 40 mg via INTRAVENOUS
  Filled 2020-12-10 (×3): qty 4

## 2020-12-10 MED ORDER — HEPARIN (PORCINE) 25000 UT/250ML-% IV SOLN
850.0000 [IU]/h | INTRAVENOUS | Status: DC
  Administered 2020-12-10: 11:00:00 750 [IU]/h via INTRAVENOUS
  Administered 2020-12-12: 14:00:00 1050 [IU]/h via INTRAVENOUS
  Administered 2020-12-13: 17:00:00 800 [IU]/h via INTRAVENOUS
  Administered 2020-12-14 – 2020-12-16 (×2): 1000 [IU]/h via INTRAVENOUS
  Administered 2020-12-17 – 2020-12-20 (×2): 950 [IU]/h via INTRAVENOUS
  Administered 2020-12-21 – 2020-12-23 (×3): 900 [IU]/h via INTRAVENOUS
  Filled 2020-12-10 (×16): qty 250

## 2020-12-10 NOTE — Progress Notes (Addendum)
PROGRESS NOTE    Mindy Mills  UMP:[id number] DOB: 1951/04/20 DOA: 12/07/2020 PCP: Patient, No Pcp Per    Brief Narrative:  69 year old female admitted to the hospital with shortness of breath and weakness.  Found to have aspiration pneumonia.  Started on intravenous Unasyn.  She was also noted to have elevated troponin and EKG changes without any chest pain.  Felt to be demand ischemia in the setting of infection/hypoxia.  Echocardiogram has been ordered.   Assessment & Plan:   Active Problems:   Acute respiratory failure (HCC)   Aspiration pneumonia (HCC)   Elevated troponin   Acute systolic CHF (congestive heart failure) (HCC)   Emphysema/COPD (HCC)   Acute respiratory failure with hypoxia -Secondary to pneumonia -Wean off oxygen as tolerated   Aspiration pneumonia -Noted to have pneumonia with debris in upper airways on CT scan -Started on Unasyn -Continue pulmonary hygiene with bronchodilators and mucolytic's -seen by speech therapy with recommendations for D3 diet -Change Unasyn to Augmentin  Acute systolic CHF -EF noted to be 25-30% -previous history of [place] CM in 2015 -does not appear to be volume overloaded -cardiology following, appreciate input -started on toprol, can consider losartan pending BPs -may need further invasive work up like cardiac cath   Elevated troponin -Patient did not complain of any chest pain -She does have some EKG changes, mainly TWI in precordial leads -Troponin elevated at 333, delta troponin flat at 338 -Echo shows low EF -appreciate cardiology assistance -Continue on aspirin   Apical thrombus -noted on echo -Started on intravenous heparin  History of [place] -patient had large [place] in 2015 from [place] aneurysm -Patient had aneurysm coiled at that time -Seen by neurology and felt reasonable to start on dual antiplatelet therapy versus anticoagulation as needed  Emphysema -noted on imaging -patient does have a history of  tobacco use, quit smoking a year ago -continue bronchodilators as needed at this time  Chronic disease stage IIIa -Creatinine is currently at baseline -Continue to monitor  Generalized weakness -seen by physical therapy with recommendations for SNF   DVT prophylaxis: Heparin infusion  Code Status: Full code Family Communication: Discussed with patient Disposition Plan: Status is: Inpatient  Remains inpatient appropriate because:Ongoing diagnostic testing needed not appropriate for outpatient work up.  May need transfer to [facility] for cardiac cath   Dispo: The patient is from: Home              Anticipated d/c is to: SNF              Anticipated d/c date is: 1 day              Patient currently is not medically stable to d/c.   Consultants:  cardiology  Procedures:  Echo: EF 25-30%, possible apical thrombus  Antimicrobials:  Unasyn 12/11 >12/14 Augmentin 12/14 >   Subjective: Feels that shortness of breath and cough are improving.  No chest pain.  Objective: Vitals:   12/10/20 0813 12/10/20 0819 12/10/20 2002 12/10/20 2040  BP:    (!) 92/59  Pulse:    71  Resp:    18  Temp:    97.7 F (36.5 C)  TempSrc:      SpO2: 94% 100% 90% 94%  Weight:      Height:        Intake/Output Summary (Last 24 hours) at 12/10/2020 2125 Last data filed at 12/10/2020 1613 Gross per 24 hour  Intake 880 ml  Output 3700 ml  Net -  2820 ml   Filed Weights   12/07/20 0941  Weight: 47.2 kg    Examination:  General exam: Alert, awake, oriented x 3 Respiratory system: Clear to auscultation. Respiratory effort normal. Cardiovascular system:RRR. No murmurs, rubs, gallops. Gastrointestinal system: Abdomen is nondistended, soft and nontender. No organomegaly or masses felt. Normal bowel sounds heard. Central nervous system: Alert and oriented. No focal neurological deficits. Extremities: No C/C/E, +pedal pulses Skin: No rashes, lesions or ulcers Psychiatry: Judgement and  insight appear normal. Mood & affect appropriate.     Data Reviewed: I have personally reviewed following labs and imaging studies  CBC: Recent Labs  Lab 12/07/20 0953 12/08/20 0705 12/10/20 0442  WBC 14.9* 10.1 9.5  NEUTROABS 12.1*  --   --   HGB 16.8* 15.2* 13.6  HCT 53.1* 48.2* 44.0  MCV 96.0 97.0 97.1  PLT 319 272 962   Basic Metabolic Panel: Recent Labs  Lab 12/07/20 0953 12/08/20 0705 12/10/20 0442  NA 144 143 143  K 4.0 4.0 3.7  CL 106 106 108  CO2 25 25 29   GLUCOSE 118* 110* 87  BUN 44* 31* 16  CREATININE 1.22* 1.06* 1.04*  CALCIUM 9.2 8.6* 7.9*   GFR: Estimated Creatinine Clearance: 38.6 mL/min (A) (by C-G formula based on SCr of 1.04 mg/dL (H)). Liver Function Tests: Recent Labs  Lab 12/07/20 0953 12/08/20 0705  AST 66* 43*  ALT 95* 74*  ALKPHOS 72 60  BILITOT 0.7 0.7  PROT 8.0 7.3  ALBUMIN 3.5 3.2*   No results for input(s): LIPASE, AMYLASE in the last 168 hours. No results for input(s): AMMONIA in the last 168 hours. Coagulation Profile: No results for input(s): INR, PROTIME in the last 168 hours. Cardiac Enzymes: Recent Labs  Lab 12/07/20 1604  CKTOTAL 348*   BNP (last 3 results) No results for input(s): PROBNP in the last 8760 hours. HbA1C: No results for input(s): HGBA1C in the last 72 hours. CBG: Recent Labs  Lab 12/08/20 1557  GLUCAP 104*   Lipid Profile: No results for input(s): CHOL, HDL, LDLCALC, TRIG, CHOLHDL, LDLDIRECT in the last 72 hours. Thyroid Function Tests: No results for input(s): TSH, T4TOTAL, FREET4, T3FREE, THYROIDAB in the last 72 hours. Anemia Panel: No results for input(s): VITAMINB12, FOLATE, FERRITIN, TIBC, IRON, RETICCTPCT in the last 72 hours. Sepsis Labs: Recent Labs  Lab 12/07/20 9528 12/07/20 0953 12/07/20 1132  PROCALCITON  --  0.48  --   LATICACIDVEN 1.8  --  1.5    Recent Results (from the past 240 hour(s))  Resp Panel by RT-PCR (Flu A&B, Covid) Nasopharyngeal Swab     Status: None    Collection Time: 12/07/20  9:37 AM   Specimen: Nasopharyngeal Swab; Nasopharyngeal(NP) swabs in vial transport medium  Result Value Ref Range Status   SARS Coronavirus 2 by RT PCR NEGATIVE NEGATIVE Final    Comment: (NOTE) SARS-CoV-2 target nucleic acids are NOT DETECTED.  The SARS-CoV-2 RNA is generally detectable in upper respiratory specimens during the acute phase of infection. The lowest concentration of SARS-CoV-2 viral copies this assay can detect is 138 copies/mL. A negative result does not preclude SARS-Cov-2 infection and should not be used as the sole basis for treatment or other patient management decisions. A negative result may occur with  improper specimen collection/handling, submission of specimen other than nasopharyngeal swab, presence of viral mutation(s) within the areas targeted by this assay, and inadequate number of viral copies(<138 copies/mL). A negative result must be combined with clinical observations, patient history,  and epidemiological information. The expected result is Negative.  Fact Sheet for Patients:  EntrepreneurPulse.com.au  Fact Sheet for Healthcare Providers:  IncredibleEmployment.be  This test is no t yet approved or cleared by the [place] FDA and  has been authorized for detection and/or diagnosis of SARS-CoV-2 by FDA under an Emergency Use Authorization (EUA). This EUA will remain  in effect (meaning this test can be used) for the duration of the COVID-19 declaration under Section 564(b)(1) of the Act, 21 U.S.C.section 360bbb-3(b)(1), unless the authorization is terminated  or revoked sooner.       Influenza A by PCR NEGATIVE NEGATIVE Final   Influenza B by PCR NEGATIVE NEGATIVE Final    Comment: (NOTE) The Xpert Xpress SARS-CoV-2/FLU/RSV plus assay is intended as an aid in the diagnosis of influenza from Nasopharyngeal swab specimens and should not be used as a sole basis for treatment.  Nasal washings and aspirates are unacceptable for Xpert Xpress SARS-CoV-2/FLU/RSV testing.  Fact Sheet for Patients: EntrepreneurPulse.com.au  Fact Sheet for Healthcare Providers: IncredibleEmployment.be  This test is not yet approved or cleared by the [place] FDA and has been authorized for detection and/or diagnosis of SARS-CoV-2 by FDA under an Emergency Use Authorization (EUA). This EUA will remain in effect (meaning this test can be used) for the duration of the COVID-19 declaration under Section 564(b)(1) of the Act, 21 U.S.C. section 360bbb-3(b)(1), unless the authorization is terminated or revoked.  Performed at [facility], [address], [place], [place] [address]   Blood Culture (routine x 2)     Status: None (Preliminary result)   Collection Time: 12/07/20  9:53 AM   Specimen: Right Antecubital; Blood  Result Value Ref Range Status   Specimen Description   Final    RIGHT ANTECUBITAL BOTTLES DRAWN AEROBIC AND ANAEROBIC   Special Requests Blood Culture adequate volume  Final   Culture   Final    NO GROWTH 2 DAYS Performed at [facility], [address] [place], [place] [address]    Report Status PENDING  Incomplete  Blood Culture (routine x 2)     Status: None (Preliminary result)   Collection Time: 12/07/20 11:22 AM   Specimen: Right Antecubital; Blood  Result Value Ref Range Status   Specimen Description   Final    RIGHT ANTECUBITAL BOTTLES DRAWN AEROBIC AND ANAEROBIC   Special Requests Blood Culture adequate volume  Final   Culture   Final    NO GROWTH 2 DAYS Performed at [facility], [address] [place], [place] [address]    Report Status PENDING  Incomplete         Radiology Studies: ECHOCARDIOGRAM COMPLETE  Result Date: 12/09/2020    ECHOCARDIOGRAM REPORT   Patient Name:   Mindy Mills Date of Exam: 12/09/2020 Medical Rec #:  [id number]        Height:       63.0 in Accession #:    [id number]        Weight:       104.0 lb Date of Birth:  14-Aug-1951         BSA:          1.464 m Patient Age:    23 years         BP:           115/90 mmHg Patient Gender: F                HR:  94 bpm. Exam Location:  [facility] Procedure: 2D Echo Indications:    Elevated Troponin  History:        Patient has prior history of Echocardiogram examinations, most                 recent 09/10/2015. TIA; Risk Factors:Former Smoker.                 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  Sonographer:    Leavy Cella RDCS (AE) Referring Phys: [place]  1. The mid to distal anteroseptal,anterior and anterolateral walls are hypokinetic. The apex is hypokinetic. There is a 0.7 x 1.3 cm mural apical thrombus. . Left ventricular ejection fraction, by estimation, is 25 to 30%. The left ventricle has severely decreased function. The left ventricle demonstrates regional wall motion abnormalities (see scoring diagram/findings for description). There is moderate left ventricular hypertrophy. Left ventricular diastolic parameters are consistent with Grade II diastolic dysfunction (pseudonormalization). Elevated left atrial pressure.  2. Right ventricular systolic function is normal. The right ventricular size is normal.  3. Left atrial size was mildly dilated.  4. The mitral valve is normal in structure. Trivial mitral valve regurgitation. No evidence of mitral stenosis. Moderate mitral annular calcification.  5. The aortic valve was not well visualized. There is mild calcification of the aortic valve. There is mild thickening of the aortic valve. Aortic valve regurgitation is not visualized. No aortic stenosis is present.  6. The inferior vena cava is normal in size with greater than 50% respiratory variability, suggesting right atrial pressure of 3 mmHg. FINDINGS   Left Ventricle: The mid to distal anteroseptal,anterior and anterolateral walls are hypokinetic. The apex is hypokinetic. There is a 0.7 x 1.3 cm mural apical thrombus. Left ventricular ejection fraction, by estimation, is 25 to 30%. The left ventricle has severely decreased function. The left ventricle demonstrates regional wall motion abnormalities. The left ventricular internal cavity size was normal in size. There is moderate left ventricular hypertrophy. Left ventricular diastolic parameters are consistent with Grade II diastolic dysfunction (pseudonormalization). Elevated left atrial pressure. Right Ventricle: The right ventricular size is normal. No increase in right ventricular wall thickness. Right ventricular systolic function is normal. Left Atrium: Left atrial size was mildly dilated. Right Atrium: Right atrial size was normal in size. Pericardium: There is no evidence of pericardial effusion. Mitral Valve: The mitral valve is normal in structure. There is mild thickening of the mitral valve leaflet(s). There is mild calcification of the mitral valve leaflet(s). Moderate mitral annular calcification. Trivial mitral valve regurgitation. No evidence of mitral valve stenosis. Tricuspid Valve: The tricuspid valve is normal in structure. Tricuspid valve regurgitation is mild . No evidence of tricuspid stenosis. Aortic Valve: The aortic valve was not well visualized. There is mild calcification of the aortic valve. There is mild thickening of the aortic valve. There is mild aortic valve annular calcification. Aortic valve regurgitation is not visualized. No aortic stenosis is present. Aortic valve mean gradient measures 2.8 mmHg. Aortic valve peak gradient measures 5.0 mmHg. Aortic valve area, by VTI measures 1.84 cm. Pulmonic Valve: The pulmonic valve was not well visualized. Pulmonic valve regurgitation is not visualized. No evidence of pulmonic  stenosis. Aorta: The aortic root is normal in size and  structure. Pulmonary Artery: Indeterminant PASP, inadequate TR jet. Venous: The inferior vena cava is normal in size with greater than 50% respiratory variability, suggesting right atrial pressure of 3 mmHg. IAS/Shunts: No atrial level shunt detected by color flow Doppler.  LEFT VENTRICLE PLAX 2D LVIDd:         3.41 cm  Diastology LVIDs:         2.51 cm  LV e' medial:    3.37 cm/s LV PW:         1.28 cm  LV E/e' medial:  20.7 LV IVS:        1.45 cm  LV e' lateral:   4.90 cm/s LVOT diam:     1.90 cm  LV E/e' lateral: 14.2 LV SV:         29 LV SV Index:   20 LVOT Area:     2.84 cm  RIGHT VENTRICLE RV S prime:     7.18 cm/s TAPSE (M-mode): 1.7 cm LEFT ATRIUM             Index       RIGHT ATRIUM           Index LA diam:        3.60 cm 2.46 cm/m  RA Area:     13.90 cm LA Vol (A2C):   52.9 ml 36.12 ml/m RA Volume:   41.30 ml  28.20 ml/m LA Vol (A4C):   43.9 ml 29.98 ml/m LA Biplane Vol: 48.1 ml 32.84 ml/m  AORTIC VALVE AV Area (Vmax):    1.83 cm AV Area (Vmean):   1.61 cm AV Area (VTI):     1.84 cm AV Vmax:           111.69 cm/s AV Vmean:          80.592 cm/s AV VTI:            0.159 m AV Peak Grad:      5.0 mmHg AV Mean Grad:      2.8 mmHg LVOT Vmax:         71.98 cm/s LVOT Vmean:        45.672 cm/s LVOT VTI:          0.103 m LVOT/AV VTI ratio: 0.65  AORTA Ao Root diam: 2.90 cm MITRAL VALVE               TRICUSPID VALVE MV Area (PHT): 4.63 cm    TR Peak grad:   37.0 mmHg MV Decel Time: 164 msec    TR Vmax:        304.00 cm/s MV E velocity: 69.80 cm/s MV A velocity: 41.10 cm/s  SHUNTS MV E/A ratio:  1.70        Systemic VTI:  0.10 m                            Systemic Diam: 1.90 cm Carlyle Dolly MD Electronically signed by Carlyle Dolly MD Signature Date/Time: 12/09/2020/12:44:45 PM    Final         Scheduled Meds:  aspirin  325 mg Oral Daily   budesonide (PULMICORT) nebulizer solution  0.25 mg Nebulization BID   FLUoxetine  80 mg Oral Daily   furosemide  40 mg Intravenous Daily   guaiFENesin   600 mg Oral BID   [START ON 12/11/2020] influenza vaccine adjuvanted  0.5 mL Intramuscular  Tomorrow-1000   ipratropium-albuterol  3 mL Nebulization BID   losartan  12.5 mg Oral Daily   metoprolol succinate  12.5 mg Oral Daily   [START ON 12/11/2020] pneumococcal 23 valent vaccine  0.5 mL Intramuscular Tomorrow-1000   traZODone  200 mg Oral QHS   Continuous Infusions:  ampicillin-sulbactam (UNASYN) IV 3 g (12/10/20 1726)   heparin 750 Units/hr (12/10/20 1047)     LOS: 3 days    Time spent: 38mins    Kathie Dike, MD [facility]   If 7PM-7AM, please contact night-coverage [email or web address]  12/10/2020, 9:25 PM

## 2020-12-10 NOTE — TOC Initial Note (Signed)
Transition of Care [facility]) - Initial/Assessment Note    Patient Details  Name: Mindy Mills MRN: [id number] Date of Birth: [birth year]  [facility] CM/SW Contact:    Shade Flood, LCSW Phone Number: 12/10/2020, 12:59 PM  Clinical Narrative:                  Pt admitted from home. PT recommending SNF rehab. Per MD, pt to transfer to [facility] for Cardiac Cath when pneumonia improves.   Spoke with pt to review PT recommendation. Pt states that she will not go to SNF at dc. Per pt, she will go home. She is agreeable to [facility]. TOC at [facility] will follow as needed after transfer to [facility].  Expected Discharge Plan: [place] Barriers to Discharge: Continued Medical Work up   Patient Goals and CMS Choice        Expected Discharge Plan and Services Expected Discharge Plan: [place] In-house Referral: Clinical Social Work     Living arrangements for the past 2 months: [place]                                      Prior Living Arrangements/Services Living arrangements for the past 2 months: Single Family Home Lives with:: Self Patient language and need for interpreter reviewed:: Yes        Need for Family Participation in Patient Care: No (Comment) Care giver support system in place?: No (comment)   Criminal Activity/Legal Involvement Pertinent to Current Situation/Hospitalization: No - Comment as needed  Activities of Daily Living Home Assistive Devices/Equipment: None ADL Screening (condition at time of admission) Patient's cognitive ability adequate to safely complete daily activities?: Yes Is the patient deaf or have difficulty hearing?: Yes Does the patient have difficulty seeing, even when wearing glasses/contacts?: No Does the patient have difficulty concentrating, remembering, or making decisions?: No Patient able to express need for assistance with ADLs?: Yes Does the patient have difficulty dressing or  bathing?: No Independently performs ADLs?: Yes (appropriate for developmental age) Does the patient have difficulty walking or climbing stairs?: Yes Weakness of Legs: Both Weakness of Arms/Hands: None  Permission Sought/Granted                  Emotional Assessment   Attitude/Demeanor/Rapport: Engaged Affect (typically observed): Pleasant Orientation: : Oriented to Self,Oriented to Place,Oriented to  Time,Oriented to Situation Alcohol / Substance Use: Not Applicable Psych Involvement: No (comment)  Admission diagnosis:  Aspiration pneumonia ([place]) [J69.0] Patient Active Problem List   Diagnosis Date Noted  . Acute systolic CHF (congestive heart failure) ([place]) 12/09/2020  . Emphysema/COPD ([place]) 12/09/2020  . Aspiration pneumonia ([place]) 12/07/2020  . Elevated troponin 12/07/2020  . Closed fracture of right distal radius 09/07/2018  . Fall 09/08/2015  . UTI (lower urinary tract infection) 09/08/2015  . Dehydration 09/08/2015  . Sepsis ([place]) 09/08/2015  . Systolic heart failure ([place]) 09/08/2015  . Confusion 09/08/2015  . TIA (transient ischemic attack) 09/08/2015  . Acute on chronic respiratory failure ([place]) 09/24/2014  . 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 Demand ischemia of myocardium: in setting of CVA 09/17/2014  . 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 Subarachnoid hemorrhage due to ruptured aneurysm ([place]) 09/11/2014  . Acute respiratory failure ([place]) 09/11/2014  . Hypokalemia 09/11/2014  . Altered mental status  09/11/2014  . Subarachnoid hemorrhage ([place]) 09/11/2014   PCP:  Patient, No Pcp Per Pharmacy:   [id number] [address], [place] [address] Phone: [phone] Fax: [phone]     Social Determinants of Health (SDOH) Interventions    Readmission Risk Interventions No flowsheet data found.

## 2020-12-10 NOTE — Progress Notes (Signed)
ANTICOAGULATION CONSULT NOTE - Follow-Up Consult  Pharmacy Consult for heparin infusion Indication: cardiac thrombus   Patient Measurements: Height: 5\' 3"  (160 cm) Weight: 47.2 kg (104 lb) IBW/kg (Calculated) : 52.4 Heparin Dosing Weight: HEPARIN DW (KG): 47.2     Labs: Recent Labs    12/08/20 0705 12/10/20 0442 12/10/20 1935  HGB 15.2* 13.6  --   HCT 48.2* 44.0  --   PLT 272 246  --   HEPARINUNFRC  --   --  0.98*  CREATININE 1.06* 1.04*  --   TROPONINIHS  --  188*  --     Estimated Creatinine Clearance: 38.6 mL/min (A) (by C-G formula based on SCr of 1.04 mg/dL (H)).   Assessment: Pharmacy consulted to dose heparin infusion for this  69 yo female   with elevated troponins up to 300, but now trending down. EKG shows precordial TWI and baseline CBC is WNL. ECHO shows apical thrombus.  Patient   received Lovenox 40mg  last night at 2145 and is concurrently on aspicrin 325mg  per day.   Patient to possibly have cath later this afternoon.  Heparin level this evening is SUPRAtherapeutic (HL 0.98, goal of 0.3-0.7). The patient and RN both indicated that the level was drawn from the same arm as the heparin infusion and above where the heparin was infusing. I will hold off on adjustments for now and check another level to get a more accurate result. No bleeding reported per RN.   Goal of Therapy:  Heparin level 0.3-0.7 units/ml Monitor platelets by anticoagulation protocol: Yes   Plan:  - No adjustments in heparin for now - Stat re-draw of the heparin level from the opposite arm - Will have the next shift follow-up on the result and make adjustments as needed  Thank you for allowing pharmacy to be a part of this patient's care.  Alycia Rossetti, PharmD, BCPS Clinical Pharmacist Clinical phone for 12/10/2020: [phone] 12/10/2020 8:31 PM   **Pharmacist phone directory can now be found on [email or web address] ([email or web address]).  Listed under [place].

## 2020-12-10 NOTE — Progress Notes (Signed)
Will be placing patient back on 3L.  Patient was on 2L when I came to give treatment.  Sat was only at 90%.

## 2020-12-10 NOTE — Progress Notes (Signed)
Progress Note  Patient Name: Mindy Mills Date of Encounter: 12/10/2020  [facility] Cardiologist: New  Subjective   SOB improving.   Inpatient Medications    Scheduled Meds: . aspirin  325 mg Oral Daily  . budesonide (PULMICORT) nebulizer solution  0.25 mg Nebulization BID  . enoxaparin (LOVENOX) injection  40 mg Subcutaneous Q24H  . FLUoxetine  80 mg Oral Daily  . guaiFENesin  600 mg Oral BID  . ipratropium-albuterol  3 mL Nebulization BID  . metoprolol succinate  12.5 mg Oral Daily  . traZODone  200 mg Oral QHS   Continuous Infusions: . ampicillin-sulbactam (UNASYN) IV 3 g (12/10/20 0602)  . lactated ringers 75 mL/hr at 12/10/20 0604   PRN Meds: albuterol, traMADol   Vital Signs    Vitals:   12/09/20 1930 12/09/20 2104 12/10/20 0813 12/10/20 0819  BP:  110/74    Pulse:  87    Resp:  18    Temp:  97.8 F (36.6 C)    TempSrc:  Oral    SpO2: 95% 99% 94% 100%  Weight:      Height:        Intake/Output Summary (Last 24 hours) at 12/10/2020 0832 Last data filed at 12/10/2020 0749 Gross per 24 hour  Intake 480 ml  Output 900 ml  Net -420 ml   Last 3 Weights 12/07/2020 10/10/2020 09/07/2018  Weight (lbs) 104 lb 105 lb 13.1 oz 109 lb  Weight (kg) 47.174 kg 48 kg 49.442 kg      Telemetry    SR - Personally Reviewed  ECG    n/a - Personally Reviewed  Physical Exam   GEN: No acute distress.   Neck: No JVD Cardiac: RRR, no murmurs, rubs, or gallops.  Respiratory: Coarse breath sounds biltaerally GI: Soft, nontender, non-distended  MS: No edema; No deformity. Neuro:  Nonfocal  Psych: Normal affect   Labs    High Sensitivity Troponin:   Recent Labs  Lab 12/07/20 1132 12/07/20 1604 12/10/20 0442  TROPONINIHS 333* 338* 188*      Chemistry Recent Labs  Lab 12/07/20 0953 12/08/20 0705 12/10/20 0442  NA 144 143 143  K 4.0 4.0 3.7  CL 106 106 108  CO2 25 25 29   GLUCOSE 118* 110* 87  BUN 44* 31* 16  CREATININE 1.22* 1.06* 1.04*   CALCIUM 9.2 8.6* 7.9*  PROT 8.0 7.3  --   ALBUMIN 3.5 3.2*  --   AST 66* 43*  --   ALT 95* 74*  --   ALKPHOS 72 60  --   BILITOT 0.7 0.7  --   GFRNONAA 48* 57* 59*  ANIONGAP 13 12 6      Hematology Recent Labs  Lab 12/07/20 0953 12/08/20 0705 12/10/20 0442  WBC 14.9* 10.1 9.5  RBC 5.53* 4.97 4.53  HGB 16.8* 15.2* 13.6  HCT 53.1* 48.2* 44.0  MCV 96.0 97.0 97.1  MCH 30.4 30.6 30.0  MCHC 31.6 31.5 30.9  RDW 14.6 14.6 14.2  PLT 319 272 246    BNP Recent Labs  Lab 12/10/20 0442  BNP 2,426.0*     DDimer  Recent Labs  Lab 12/07/20 0953  DDIMER 5.00*     Radiology    ECHOCARDIOGRAM COMPLETE  Result Date: 12/09/2020    ECHOCARDIOGRAM REPORT   Patient Name:   Mindy Mills Date of Exam: 12/09/2020 Medical Rec #:  [id number]        Height:       63.0  in Accession #:    [id number]       Weight:       104.0 lb Date of Birth:  Feb 23, 1951         BSA:          1.464 m Patient Age:    62 years         BP:           115/90 mmHg Patient Gender: F                HR:           94 bpm. Exam Location:  [facility] Procedure: 2D Echo Indications:    Elevated Troponin  History:        Patient has prior history of Echocardiogram examinations, most                 recent 09/10/2015. TIA; Risk Factors:Former Smoker.                 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  Sonographer:    Leavy Cella RDCS (AE) Referring Phys: [place]  1. The mid to distal anteroseptal,anterior and anterolateral walls are hypokinetic. The apex is hypokinetic. There is a 0.7 x 1.3 cm mural apical thrombus. . Left ventricular ejection fraction, by estimation, is 25 to 30%. The left ventricle has severely decreased function. The left ventricle demonstrates regional wall motion abnormalities (see scoring diagram/findings for description).  There is moderate left ventricular hypertrophy. Left ventricular diastolic parameters are consistent with Grade II diastolic dysfunction (pseudonormalization). Elevated left atrial pressure.  2. Right ventricular systolic function is normal. The right ventricular size is normal.  3. Left atrial size was mildly dilated.  4. The mitral valve is normal in structure. Trivial mitral valve regurgitation. No evidence of mitral stenosis. Moderate mitral annular calcification.  5. The aortic valve was not well visualized. There is mild calcification of the aortic valve. There is mild thickening of the aortic valve. Aortic valve regurgitation is not visualized. No aortic stenosis is present.  6. The inferior vena cava is normal in size with greater than 50% respiratory variability, suggesting right atrial pressure of 3 mmHg. FINDINGS  Left Ventricle: The mid to distal anteroseptal,anterior and anterolateral walls are hypokinetic. The apex is hypokinetic. There is a 0.7 x 1.3 cm mural apical thrombus. Left ventricular ejection fraction, by estimation, is 25 to 30%. The left ventricle has severely decreased function. The left ventricle demonstrates regional wall motion abnormalities. The left ventricular internal cavity size was normal in size. There is moderate left ventricular hypertrophy. Left ventricular diastolic parameters are consistent with Grade II diastolic dysfunction (pseudonormalization). Elevated left atrial pressure. Right Ventricle: The right ventricular size is normal. No increase in right ventricular wall thickness. Right ventricular systolic function is normal. Left Atrium: Left atrial size was mildly dilated. Right Atrium: Right atrial size was normal in size. Pericardium: There is no evidence of pericardial effusion. Mitral Valve: The mitral valve is normal in structure. There is mild thickening of the mitral valve leaflet(s). There is mild calcification of the mitral valve leaflet(s). Moderate mitral  annular calcification. Trivial mitral valve regurgitation. No evidence of mitral valve  stenosis. Tricuspid Valve: The tricuspid valve is normal in structure. Tricuspid valve regurgitation is mild . No evidence of tricuspid stenosis. Aortic Valve: The aortic valve was not well visualized. There is mild calcification of the aortic valve. There is mild thickening of the aortic valve. There is mild aortic valve annular calcification. Aortic valve regurgitation is not visualized. No aortic stenosis is present. Aortic valve mean gradient measures 2.8 mmHg. Aortic valve peak gradient measures 5.0 mmHg. Aortic valve area, by VTI measures 1.84 cm. Pulmonic Valve: The pulmonic valve was not well visualized. Pulmonic valve regurgitation is not visualized. No evidence of pulmonic stenosis. Aorta: The aortic root is normal in size and structure. Pulmonary Artery: Indeterminant PASP, inadequate TR jet. Venous: The inferior vena cava is normal in size with greater than 50% respiratory variability, suggesting right atrial pressure of 3 mmHg. IAS/Shunts: No atrial level shunt detected by color flow Doppler.  LEFT VENTRICLE PLAX 2D LVIDd:         3.41 cm  Diastology LVIDs:         2.51 cm  LV e' medial:    3.37 cm/s LV PW:         1.28 cm  LV E/e' medial:  20.7 LV IVS:        1.45 cm  LV e' lateral:   4.90 cm/s LVOT diam:     1.90 cm  LV E/e' lateral: 14.2 LV SV:         29 LV SV Index:   20 LVOT Area:     2.84 cm  RIGHT VENTRICLE RV S prime:     7.18 cm/s TAPSE (M-mode): 1.7 cm LEFT ATRIUM             Index       RIGHT ATRIUM           Index LA diam:        3.60 cm 2.46 cm/m  RA Area:     13.90 cm LA Vol (A2C):   52.9 ml 36.12 ml/m RA Volume:   41.30 ml  28.20 ml/m LA Vol (A4C):   43.9 ml 29.98 ml/m LA Biplane Vol: 48.1 ml 32.84 ml/m  AORTIC VALVE AV Area (Vmax):    1.83 cm AV Area (Vmean):   1.61 cm AV Area (VTI):     1.84 cm AV Vmax:           111.69 cm/s AV Vmean:          80.592 cm/s AV VTI:            0.159 m AV Peak  Grad:      5.0 mmHg AV Mean Grad:      2.8 mmHg LVOT Vmax:         71.98 cm/s LVOT Vmean:        45.672 cm/s LVOT VTI:          0.103 m LVOT/AV VTI ratio: 0.65  AORTA Ao Root diam: 2.90 cm MITRAL VALVE               TRICUSPID VALVE MV Area (PHT): 4.63 cm    TR Peak grad:   37.0 mmHg MV Decel Time: 164 msec    TR Vmax:        304.00 cm/s MV E velocity: 69.80 cm/s MV A velocity: 41.10 cm/s  SHUNTS MV E/A ratio:  1.70        Systemic VTI:  0.10 m  Systemic Diam: 1.90 cm Carlyle Dolly MD Electronically signed by Carlyle Dolly MD Signature Date/Time: 12/09/2020/12:44:45 PM    Final     Cardiac Studies    Patient Profile     Mindy Mills is a 70 y.o. female with a hx of prior systolic dysfunction that noramlized, SAH,  who is being seen today for the evaluation of SOB at the request of Dr Roderic Palau.   Assessment & Plan    1. 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 Start lasix 40mg  IV daily.  - started toprol 12.5mg  daily. Low normal bp's, try losartan 12.5mg  daily today.   2. Elevated troponin - flat trop around 300 now trending down, EKG with precordial TWIs. Denies any chest pains - could be demand ischemia related to pneumonia. Can also see elevated trops in setting of stress induced CM. Cannot exlcude ischemia.  - would need clearance to consider DAPT prior to committing to a cath. Would also need to be further recovered from her pneumonia and more euvolemic. Possible cath later this admission. From neuro note appears DAPT would not be an issue   3. Pneumonia - possible aspiration pneumonia, management per primrary team   4. Apical thrombus - noted on echo this  admission in setting of apical hypokinesis - will need neuro clearance to consider anticoag given history of prior SAH - if cleared would initiate heparin, later coumadin pending invasive procedures.   - from neuro note ok to start anticoag, will start hep gtt. Convert to coumadin later in admission pending need for invasive procedures   5. History of SAH - in 2015 had aneurysmal bleed - will need neuro clearance to consider either anticoag for thrombus or DAPT if she potentially has a cath this admit  For questions or updates, please contact [place] Please consult [email or web address] for contact info under        Signed, Carlyle Dolly, MD  12/10/2020, 8:32 AM

## 2020-12-10 NOTE — Progress Notes (Signed)
ANTICOAGULATION CONSULT NOTE - [place] for heparin infusion Indication: cardiac thrombus   Recent Labs    12/08/20 0705 12/10/20 0442 12/10/20 1935 12/10/20 2133  HGB 15.2* 13.6  --   --   HCT 48.2* 44.0  --   --   PLT 272 246  --   --   HEPARINUNFRC  --   --  0.98* 0.21*  CREATININE 1.06* 1.04*  --   --   TROPONINIHS  --  188*  --   --     Estimated Creatinine Clearance: 38.6 mL/min (A) (by C-G formula based on SCr of 1.04 mg/dL (H)).   Assessment: Pharmacy consulted to dose heparin infusion for this  69 yo female   with elevated troponins up to 300, but now trending down. EKG shows precordial TWI and baseline CBC is WNL. ECHO shows apical thrombus.  Patient   received Lovenox 40mg  last night at 2145 and is concurrently on aspicrin 325mg  per day.   Patient to possibly have cath later this afternoon.  Heparin level this evening is SUPRAtherapeutic (HL 0.98, goal of 0.3-0.7). The patient and RN both indicated that the level was drawn from the same arm as the heparin infusion and above where the heparin was infusing. I will hold off on adjustments for now and check another level to get a more accurate result. No bleeding reported per RN.  Repeat heparin level 0.21 units/ml  Goal of Therapy:  Heparin level 0.3-0.7 units/ml Monitor platelets by anticoagulation protocol: Yes   Plan:  - Increase heparin drip to 900 units/hr Check heparin level in 6-8 hours  Thanks for allowing pharmacy to be a part of this patient's care.  Excell Seltzer, PharmD Clinical Pharmacist

## 2020-12-10 NOTE — Progress Notes (Signed)
ANTICOAGULATION CONSULT NOTE - Initial Consult  Pharmacy Consult for heparin infusion Indication: cardiac thrombus  Allergies  Allergen Reactions  . Bee Venom Anaphylaxis and Hives  . Capsicum (Cayenne) [Cayenne] Swelling    Patient Measurements: Height: 5\' 3"  (160 cm) Weight: 47.2 kg (104 lb) IBW/kg (Calculated) : 52.4 Heparin Dosing Weight: HEPARIN DW (KG): 47.2     Labs: Recent Labs    12/07/20 0953 12/07/20 1132 12/07/20 1604 12/08/20 0705 12/10/20 0442  HGB 16.8*  --   --  15.2* 13.6  HCT 53.1*  --   --  48.2* 44.0  PLT 319  --   --  272 246  CREATININE 1.22*  --   --  1.06* 1.04*  CKTOTAL  --   --  348*  --   --   TROPONINIHS  --  333* 338*  --  188*    Estimated Creatinine Clearance: 38.6 mL/min (A) (by C-G formula based on SCr of 1.04 mg/dL (H)).   Medical History: Past Medical History:  Diagnosis Date  . 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 Full dentures   . Hepatitis C    denies  . Hypertension   . Migraine   . Myocardial infarction ([place])   . 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  Last one in 2015  . Wears glasses   . Wears hearing aid in both ears      Assessment: Pharmacy consulted to dose heparin infusion for this  69 yo female   with elevated troponins up to 300, but now trending down. EKG shows precordial TWI and baseline CBC is WNL. ECHO shows apical thrombus.  Patient   received Lovenox 40mg  last night at 2145 and is concurrently on aspicrin 325mg  per day.   Patient to possibly have cath later this afternoon.   Goal of Therapy:  Heparin level 0.3-0.7 units/ml Monitor platelets by anticoagulation protocol: Yes   Plan:  Give 2800 units bolus x 1 Start heparin infusion at 750 units/hr Check anti-Xa level in 6-8 hours and daily while on heparin Continue to monitor H&H and platelets  Despina Pole 12/10/2020,9:11 AM

## 2020-12-11 LAB — BASIC METABOLIC PANEL
Anion gap: 10 (ref 5–15)
BUN: 14 mg/dL (ref 8–23)
CO2: 28 mmol/L (ref 22–32)
Calcium: 7.9 mg/dL — ABNORMAL LOW (ref 8.9–10.3)
Chloride: 104 mmol/L (ref 98–111)
Creatinine, Ser: 1.09 mg/dL — ABNORMAL HIGH (ref 0.44–1.00)
GFR, Estimated: 55 mL/min — ABNORMAL LOW (ref >60)
Glucose, Bld: 100 mg/dL — ABNORMAL HIGH (ref 70–99)
Potassium: 3.4 mmol/L — ABNORMAL LOW (ref 3.5–5.1)
Sodium: 142 mmol/L (ref 135–145)

## 2020-12-11 LAB — CBC
HCT: 45.1 % (ref 36.0–46.0)
Hemoglobin: 14 g/dL (ref 12.0–15.0)
MCH: 29.9 pg (ref 26.0–34.0)
MCHC: 31 g/dL (ref 30.0–36.0)
MCV: 96.4 fL (ref 80.0–100.0)
Platelets: 261 10*3/uL (ref 150–400)
RBC: 4.68 MIL/uL (ref 3.87–5.11)
RDW: 13.9 % (ref 11.5–15.5)
WBC: 10.1 10*3/uL (ref 4.0–10.5)
nRBC: 0 % (ref 0.0–0.2)

## 2020-12-11 LAB — HEPARIN LEVEL (UNFRACTIONATED)
Heparin Unfractionated: 0.39 IU/mL (ref 0.30–0.70)
Heparin Unfractionated: 0.22 IU/mL — ABNORMAL LOW (ref 0.30–0.70)

## 2020-12-11 MED ORDER — ENSURE ENLIVE PO LIQD
237.0000 mL | Freq: Two times a day (BID) | ORAL | Status: DC
  Administered 2020-12-12 – 2020-12-24 (×17): 237 mL via ORAL
  Filled 2020-12-11 (×2): qty 237

## 2020-12-11 MED ORDER — IPRATROPIUM-ALBUTEROL 0.5-2.5 (3) MG/3ML IN SOLN
3.0000 mL | Freq: Three times a day (TID) | RESPIRATORY_TRACT | Status: DC
  Administered 2020-12-11: 20:00:00 3 mL via RESPIRATORY_TRACT
  Filled 2020-12-11: qty 3

## 2020-12-11 MED ORDER — ADULT MULTIVITAMIN W/MINERALS CH
1.0000 | ORAL_TABLET | Freq: Every day | ORAL | Status: DC
  Administered 2020-12-11 – 2020-12-12 (×2): 1 via ORAL
  Filled 2020-12-11 (×4): qty 1

## 2020-12-11 MED ORDER — BUDESONIDE 0.5 MG/2ML IN SUSP
0.5000 mg | Freq: Two times a day (BID) | RESPIRATORY_TRACT | Status: DC
  Administered 2020-12-11 – 2020-12-24 (×24): 0.5 mg via RESPIRATORY_TRACT
  Filled 2020-12-11 (×26): qty 2

## 2020-12-11 MED ORDER — IPRATROPIUM-ALBUTEROL 0.5-2.5 (3) MG/3ML IN SOLN
3.0000 mL | Freq: Two times a day (BID) | RESPIRATORY_TRACT | Status: DC
  Administered 2020-12-12 – 2020-12-15 (×8): 3 mL via RESPIRATORY_TRACT
  Filled 2020-12-11 (×8): qty 3

## 2020-12-11 NOTE — Progress Notes (Signed)
PROGRESS NOTE  Mindy Mills MVH:[id number] DOB: 1951-08-16 DOA: 12/07/2020 PCP: Patient, No Pcp Per  Brief History:  69 y.o. 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  Patient has since had PEG tube and tracheostomy removed.  She reports that for the past 3 days, she has progressive shortness of breath and cough.  She denies any fever.  She denies any vomiting.  She has not felt as though she is been choking after eating or drinking.  She has not had any chest pain.  She feels increasingly weak.  No dysuria, diarrhea.  Since she had progressive shortness of breath, she called EMS for further evaluation  ED Course: Upon arrival to the emergency room, patient is noted to have a mildly elevated creatinine.  Oxygen has been applied since she was noted to be hypoxic.  Oxygen saturations were 88% on room air.  D-dimer was noted to be elevated and CT of the chest was performed that indicated evidence of aspiration pneumonia.  COVID-19 test was found to be negative.  Troponin mildly elevated around 300 and EKG showed T wave inversions in the anterolateral leads.  She was referred for admission.  Assessment/Plan:  Acute respiratory failure with hypoxia -Secondary to pneumonia and CHF -currently stable on 4L -Wean off oxygen as tolerated  Aspiration pneumonia -12/1 CTA chest--no PE; extensive fluid and debris RLL bronchus -Started on Unasyn 12/12 -Continue pulmonary hygiene with bronchodilators and mucolytic's -seen by speech therapy with recommendations for D3 diet -Change Unasyn to Augmentin  Acute systolic CHF -84/13/24 EF 25-30%, G2DD, +apical thrombus, trivial MR -previous history of Takotsubo CM in 2015 -continue IV lasix -cardiology following, appreciate input -continue toprol, can consider losartan pending BPs -plans noted for possible cardiac  cath  Elevated troponin -Patient did not complain of any chest pain -She does have some EKG changes, mainly TWI in precordial leads -Troponin elevated at 333, delta troponin flat at 338 -Echo shows low EF -appreciate cardiology assistance -could be demand ischemia related to pneumonia. Can also see elevated trops in setting of stress induced CM. Cannot exlcude ischemia.  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 -Patient had aneurysm coiled at that time -Seen by neurology and felt reasonable to start on dual antiplatelet therapy versus anticoagulation as needed  Emphysema -noted on CTA chest -patient does have a history of tobacco use, quit smoking a year ago -continue bronchodilators as needed at this time -increase pulmicort to 0.5 bid  CKD IIIa -Creatinine is currently at baseline -baseline 1.0-1.2 -monitor with diuresis  Generalized weakness -seen by physical therapy with recommendations for SNF--pt refuses      Status is: Inpatient  Remains inpatient appropriate because:IV treatments appropriate due to intensity of illness or inability to take PO   Dispo: The patient is from: Home              Anticipated d/c is to: Home              Anticipated d/c date is: 2 days              Patient currently is not medically stable to d/c.        Family Communication:   No Family at bedside  Consultants:  Cardiology  Code  Status:  FULL   DVT Prophylaxis:  IV Heparin   Procedures: As Listed in Progress Note Above  Antibiotics: Unasyn 12/12>>      Subjective: Patient denies fevers, chills, headache, chest pain, dyspnea, nausea, vomiting,  abdominal pain, dysuria, hematuria, hematochezia, and melena.  She had 2 loose stools today   Objective: Vitals:   12/11/20 0443 12/11/20 0527 12/11/20 0757 12/11/20 0804  BP:  112/84    Pulse:  (!)  54    Resp:  18    Temp:  98.6 F (37 C)    TempSrc:  Oral    SpO2:  96% 92% 96%  Weight: 47.3 kg     Height:        Intake/Output Summary (Last 24 hours) at 12/11/2020 1730 Last data filed at 12/11/2020 0300 Gross per 24 hour  Intake --  Output 900 ml  Net -900 ml   Weight change:  Exam:   General:  Pt is alert, follows commands appropriately, not in acute distress  HEENT: No icterus, No thrush, No neck mass, [place]/AT  Cardiovascular: RRR, S1/S2, no rubs, no gallops  Respiratory: bibasilar rales. No wheeze  Abdomen: Soft/+BS, non tender, non distended, no guarding  Extremities: 1+ LE edema, No lymphangitis, No petechiae, No rashes, no synovitis   Data Reviewed: I have personally reviewed following labs and imaging studies Basic Metabolic Panel: Recent Labs  Lab 12/07/20 0953 12/08/20 0705 12/10/20 0442 12/11/20 0655  NA 144 143 143 142  K 4.0 4.0 3.7 3.4*  CL 106 106 108 104  CO2 25 25 29 28   GLUCOSE 118* 110* 87 100*  BUN 44* 31* 16 14  CREATININE 1.22* 1.06* 1.04* 1.09*  CALCIUM 9.2 8.6* 7.9* 7.9*   Liver Function Tests: Recent Labs  Lab 12/07/20 0953 12/08/20 0705  AST 66* 43*  ALT 95* 74*  ALKPHOS 72 60  BILITOT 0.7 0.7  PROT 8.0 7.3  ALBUMIN 3.5 3.2*   No results for input(s): LIPASE, AMYLASE in the last 168 hours. No results for input(s): AMMONIA in the last 168 hours. Coagulation Profile: No results for input(s): INR, PROTIME in the last 168 hours. CBC: Recent Labs  Lab 12/07/20 0953 12/08/20 0705 12/10/20 0442 12/11/20 0655  WBC 14.9* 10.1 9.5 10.1  NEUTROABS 12.1*  --   --   --   HGB 16.8* 15.2* 13.6 14.0  HCT 53.1* 48.2* 44.0 45.1  MCV 96.0 97.0 97.1 96.4  PLT 319 272 246 261   Cardiac Enzymes: Recent Labs  Lab 12/07/20 1604  CKTOTAL 348*   BNP: Invalid input(s): POCBNP CBG: Recent Labs  Lab 12/08/20 1557  GLUCAP 104*   HbA1C: No results for input(s): HGBA1C in the last 72 hours. Urine analysis:    Component  Value Date/Time   COLORURINE YELLOW 09/08/2015 1817   APPEARANCEUR CLEAR 09/08/2015 1817   LABSPEC 1.038 (H) 09/08/2015 1817   PHURINE 6.5 09/08/2015 1817   GLUCOSEU NEGATIVE 09/08/2015 1817   HGBUR LARGE (A) 09/08/2015 1817   BILIRUBINUR NEGATIVE 09/08/2015 1817   KETONESUR NEGATIVE 09/08/2015 1817   PROTEINUR 30 (A) 09/08/2015 1817   UROBILINOGEN 1.0 09/08/2015 1817   NITRITE NEGATIVE 09/08/2015 1817   LEUKOCYTESUR SMALL (A) 09/08/2015 1817   Sepsis Labs: @LABRCNTIP (procalcitonin:4,lacticidven:4) ) Recent Results (from the past 240 hour(s))  Resp Panel by RT-PCR (Flu A&B, Covid) Nasopharyngeal Swab     Status: None   Collection Time: 12/07/20  9:37 AM   Specimen: Nasopharyngeal Swab; Nasopharyngeal(NP) swabs in vial transport medium  Result Value Ref Range Status   SARS Coronavirus 2 by RT PCR NEGATIVE NEGATIVE Final    Comment: (NOTE) SARS-CoV-2 target nucleic acids are NOT DETECTED.  The SARS-CoV-2 RNA is generally detectable in upper respiratory specimens during the acute phase of infection. The lowest concentration of SARS-CoV-2 viral copies this assay can detect is 138 copies/mL. A negative result does not preclude SARS-Cov-2 infection and should not be used as the sole basis for treatment or other patient management decisions. A negative result may occur with  improper specimen collection/handling, submission of specimen other than nasopharyngeal swab, presence of viral mutation(s) within the areas targeted by this assay, and inadequate number of viral copies(<138 copies/mL). A negative result must be combined with clinical observations, patient history, and epidemiological information. The expected result is Negative.  Fact Sheet for Patients:  EntrepreneurPulse.com.au  Fact Sheet for Healthcare Providers:  IncredibleEmployment.be  This test is no t yet approved or cleared by the [place] FDA and  has been authorized for  detection and/or diagnosis of SARS-CoV-2 by FDA under an Emergency Use Authorization (EUA). This EUA will remain  in effect (meaning this test can be used) for the duration of the COVID-19 declaration under Section 564(b)(1) of the Act, 21 U.S.C.section 360bbb-3(b)(1), unless the authorization is terminated  or revoked sooner.       Influenza A by PCR NEGATIVE NEGATIVE Final   Influenza B by PCR NEGATIVE NEGATIVE Final    Comment: (NOTE) The Xpert Xpress SARS-CoV-2/FLU/RSV plus assay is intended as an aid in the diagnosis of influenza from Nasopharyngeal swab specimens and should not be used as a sole basis for treatment. Nasal washings and aspirates are unacceptable for Xpert Xpress SARS-CoV-2/FLU/RSV testing.  Fact Sheet for Patients: EntrepreneurPulse.com.au  Fact Sheet for Healthcare Providers: IncredibleEmployment.be  This test is not yet approved or cleared by the [place] FDA and has been authorized for detection and/or diagnosis of SARS-CoV-2 by FDA under an Emergency Use Authorization (EUA). This EUA will remain in effect (meaning this test can be used) for the duration of the COVID-19 declaration under Section 564(b)(1) of the Act, 21 U.S.C. section 360bbb-3(b)(1), unless the authorization is terminated or revoked.  Performed at [facility], [address], [place], [place] [address]   Blood Culture (routine x 2)     Status: None (Preliminary result)   Collection Time: 12/07/20  9:53 AM   Specimen: Right Antecubital; Blood  Result Value Ref Range Status   Specimen Description   Final    RIGHT ANTECUBITAL BOTTLES DRAWN AEROBIC AND ANAEROBIC   Special Requests Blood Culture adequate volume  Final   Culture   Final    NO GROWTH 2 DAYS Performed at [facility] [address], [address] [place], [place] [address]    Report Status PENDING  Incomplete  Blood Culture (routine x 2)     Status: None (Preliminary result)   Collection Time:  12/07/20 11:22 AM   Specimen: Right Antecubital; Blood  Result Value Ref Range Status   Specimen Description   Final    RIGHT ANTECUBITAL BOTTLES DRAWN AEROBIC AND ANAEROBIC   Special Requests Blood Culture adequate volume  Final   Culture   Final    NO GROWTH 2 DAYS Performed at [facility], [address] [place], [place] [address]    Report Status PENDING  Incomplete     Scheduled Meds: . amoxicillin-clavulanate  1 tablet Oral Q12H  . aspirin  325 mg Oral Daily  . budesonide (PULMICORT) nebulizer  solution  0.25 mg Nebulization BID  . [START ON 12/12/2020] feeding supplement  237 mL Oral BID BM  . FLUoxetine  80 mg Oral Daily  . furosemide  40 mg Intravenous Daily  . guaiFENesin  600 mg Oral BID  . ipratropium-albuterol  3 mL Nebulization BID  . losartan  12.5 mg Oral Daily  . metoprolol succinate  12.5 mg Oral Daily  . multivitamin with minerals  1 tablet Oral Daily  . traZODone  200 mg Oral QHS   Continuous Infusions: . heparin 900 Units/hr (12/10/20 2343)    Procedures/Studies: CT Angio Chest PE W/Cm &/Or Wo Cm  Result Date: 12/07/2020 CLINICAL DATA:  Chest pain for several days and elevated D-dimer. EXAM: CT ANGIOGRAPHY CHEST WITH CONTRAST TECHNIQUE: Multidetector CT imaging of the chest was performed using the standard protocol during bolus administration of intravenous contrast. Multiplanar CT image reconstructions and MIPs were obtained to evaluate the vascular anatomy. CONTRAST:  25mL OMNIPAQUE IOHEXOL 350 MG/ML SOLN COMPARISON:  None. FINDINGS: Cardiovascular: The heart is borderline enlarged for age. No pericardial effusion. Significant age advanced atherosclerotic calcifications involving the thoracic aorta and branch vessels. Three-vessel coronary artery calcifications are noted. The pulmonary arterial tree is well opacified. No filling defects to suggest pulmonary embolism. Mediastinum/Nodes: Borderline right hilar lymph nodes likely reactive/inflammatory. No  mediastinal adenopathy. There is a large hiatal hernia. Lungs/Pleura: Severe emphysematous changes and pulmonary scarring. Extensive fluid and debris filling the right lower lobe bronchus and its branches highly suggestive acute aspiration. There is also mild peribronchial thickening and peribronchial inflammatory changes. Right lower lobe atelectasis is noted adjacent to the hiatal hernia. No pleural effusions. No worrisome pulmonary lesions. Upper Abdomen: Chronic appearing left-sided hydronephrosis with fairly significant renal cortical thinning. Aortic and branch vessel calcifications are noted. Low-attenuation left adrenal gland lesion, likely benign adenoma. Musculoskeletal: No breast masses, supraclavicular or axillary adenopathy. The bony thorax is intact. No worrisome bone lesions or acute fractures. Review of the MIP images confirms the above findings. IMPRESSION: 1. No CT findings for pulmonary embolism. 2. Significant age advanced atherosclerotic calcifications involving the thoracic aorta and branch vessels including the coronary arteries. 3. Severe emphysematous changes and pulmonary scarring. 4. Extensive fluid and debris filling the right lower lobe bronchus and its branches highly suggestive of acute aspiration. There is also mild peribronchial thickening and peribronchial inflammatory changes. 5. Large hiatal hernia. 6. Chronic appearing left-sided hydronephrosis with fairly significant renal cortical thinning. 7. Emphysema and aortic atherosclerosis. Aortic Atherosclerosis (ICD10-I70.0) and Emphysema (ICD10-J43.9). Electronically Signed   By: Marijo Sanes M.D.   On: 12/07/2020 12:33   DG Chest Port 1 View  Result Date: 12/07/2020 CLINICAL DATA:  cough EXAM: PORTABLE CHEST 1 VIEW COMPARISON:  10/10/2020 and prior. FINDINGS: No focal consolidation. No pneumothorax or pleural effusion. Stable cardiomediastinal silhouette including moderate hiatal hernia. Aortic atherosclerotic calcifications.  No acute osseous abnormality. IMPRESSION: No focal airspace disease. Electronically Signed   By: Primitivo Gauze M.D.   On: 12/07/2020 10:12   ECHOCARDIOGRAM COMPLETE  Result Date: 12/09/2020    ECHOCARDIOGRAM REPORT   Patient Name:   Mindy Mills Date of Exam: 12/09/2020 Medical Rec #:  [id number]        Height:       63.0 in Accession #:    [id number]       Weight:       104.0 lb Date of Birth:  [birth year]         BSA:  1.464 m Patient Age:    34 years         BP:           115/90 mmHg Patient Gender: F                HR:           94 bpm. Exam Location:  [facility] Procedure: 2D Echo Indications:    Elevated Troponin  History:        Patient has prior history of Echocardiogram examinations, most                 recent 09/10/2015. TIA; Risk Factors:Former Smoker.                 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  Sonographer:    Leavy Cella RDCS (AE) Referring Phys: [place]  1. The mid to distal anteroseptal,anterior and anterolateral walls are hypokinetic. The apex is hypokinetic. There is a 0.7 x 1.3 cm mural apical thrombus. . Left ventricular ejection fraction, by estimation, is 25 to 30%. The left ventricle has severely decreased function. The left ventricle demonstrates regional wall motion abnormalities (see scoring diagram/findings for description). There is moderate left ventricular hypertrophy. Left ventricular diastolic parameters are consistent with Grade II diastolic dysfunction (pseudonormalization). Elevated left atrial pressure.  2. Right ventricular systolic function is normal. The right ventricular size is normal.  3. Left atrial size was mildly dilated.  4. The mitral valve is normal in structure. Trivial mitral valve regurgitation. No evidence of mitral stenosis. Moderate mitral annular  calcification.  5. The aortic valve was not well visualized. There is mild calcification of the aortic valve. There is mild thickening of the aortic valve. Aortic valve regurgitation is not visualized. No aortic stenosis is present.  6. The inferior vena cava is normal in size with greater than 50% respiratory variability, suggesting right atrial pressure of 3 mmHg. FINDINGS  Left Ventricle: The mid to distal anteroseptal,anterior and anterolateral walls are hypokinetic. The apex is hypokinetic. There is a 0.7 x 1.3 cm mural apical thrombus. Left ventricular ejection fraction, by estimation, is 25 to 30%. The left ventricle has severely decreased function. The left ventricle demonstrates regional wall motion abnormalities. The left ventricular internal cavity size was normal in size. There is moderate left ventricular hypertrophy. Left ventricular diastolic parameters are consistent with Grade II diastolic dysfunction (pseudonormalization). Elevated left atrial pressure. Right Ventricle: The right ventricular size is normal. No increase in right ventricular wall thickness. Right ventricular systolic function is normal. Left Atrium: Left atrial size was mildly dilated. Right Atrium: Right atrial size was normal in size. Pericardium: There is no evidence of pericardial effusion. Mitral Valve: The mitral valve is normal in structure. There is mild thickening of the mitral valve leaflet(s). There is mild calcification of the mitral valve leaflet(s). Moderate mitral annular calcification. Trivial mitral valve regurgitation. No evidence of mitral valve stenosis. Tricuspid Valve: The tricuspid valve is normal in structure. Tricuspid valve regurgitation is mild . No evidence of tricuspid stenosis. Aortic Valve: The aortic valve was not well visualized. There is mild calcification of the aortic valve. There is mild thickening of the aortic valve.  There is mild aortic valve annular calcification. Aortic valve regurgitation is  not visualized. No aortic stenosis is present. Aortic valve mean gradient measures 2.8 mmHg. Aortic valve peak gradient measures 5.0 mmHg. Aortic valve area, by VTI measures 1.84 cm. Pulmonic Valve: The pulmonic valve was not well visualized. Pulmonic valve regurgitation is not visualized. No evidence of pulmonic stenosis. Aorta: The aortic root is normal in size and structure. Pulmonary Artery: Indeterminant PASP, inadequate TR jet. Venous: The inferior vena cava is normal in size with greater than 50% respiratory variability, suggesting right atrial pressure of 3 mmHg. IAS/Shunts: No atrial level shunt detected by color flow Doppler.  LEFT VENTRICLE PLAX 2D LVIDd:         3.41 cm  Diastology LVIDs:         2.51 cm  LV e' medial:    3.37 cm/s LV PW:         1.28 cm  LV E/e' medial:  20.7 LV IVS:        1.45 cm  LV e' lateral:   4.90 cm/s LVOT diam:     1.90 cm  LV E/e' lateral: 14.2 LV SV:         29 LV SV Index:   20 LVOT Area:     2.84 cm  RIGHT VENTRICLE RV S prime:     7.18 cm/s TAPSE (M-mode): 1.7 cm LEFT ATRIUM             Index       RIGHT ATRIUM           Index LA diam:        3.60 cm 2.46 cm/m  RA Area:     13.90 cm LA Vol (A2C):   52.9 ml 36.12 ml/m RA Volume:   41.30 ml  28.20 ml/m LA Vol (A4C):   43.9 ml 29.98 ml/m LA Biplane Vol: 48.1 ml 32.84 ml/m  AORTIC VALVE AV Area (Vmax):    1.83 cm AV Area (Vmean):   1.61 cm AV Area (VTI):     1.84 cm AV Vmax:           111.69 cm/s AV Vmean:          80.592 cm/s AV VTI:            0.159 m AV Peak Grad:      5.0 mmHg AV Mean Grad:      2.8 mmHg LVOT Vmax:         71.98 cm/s LVOT Vmean:        45.672 cm/s LVOT VTI:          0.103 m LVOT/AV VTI ratio: 0.65  AORTA Ao Root diam: 2.90 cm MITRAL VALVE               TRICUSPID VALVE MV Area (PHT): 4.63 cm    TR Peak grad:   37.0 mmHg MV Decel Time: 164 msec    TR Vmax:        304.00 cm/s MV E velocity: 69.80 cm/s MV A velocity: 41.10 cm/s  SHUNTS MV E/A ratio:  1.70        Systemic VTI:  0.10 m                             Systemic Diam: 1.90 cm Carlyle Dolly MD Electronically signed by Carlyle Dolly MD Signature Date/Time: 12/09/2020/12:44:45 PM    Final     Shanon Brow  Kaylen Nghiem, DO  [facility]  If 7PM-7AM, please contact night-coverage [email or web address] Password [email or web address] 12/11/2020, 5:30 PM   LOS: 4 days
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
## 2020-12-11 NOTE — Progress Notes (Signed)
ANTICOAGULATION CONSULT NOTE - [place] for heparin infusion Indication: cardiac thrombus   Recent Labs    12/10/20 0442 12/10/20 1935 12/10/20 2133 12/11/20 0655 12/11/20 1607  HGB 13.6  --   --  14.0  --   HCT 44.0  --   --  45.1  --   PLT 246  --   --  261  --   HEPARINUNFRC  --    < > 0.21* 0.39 0.22*  CREATININE 1.04*  --   --  1.09*  --   TROPONINIHS 188*  --   --   --   --    < > = values in this interval not displayed.    Estimated Creatinine Clearance: 36.9 mL/min (A) (by C-G formula based on SCr of 1.09 mg/dL (H)).   Assessment: Pharmacy consulted to dose heparin infusion for this  69 yo female   with elevated troponins up to 300, but now trending down. EKG shows precordial TWI and baseline CBC is WNL. ECHO shows apical thrombus.  Patient   received Lovenox 40mg  last night at 2145 and is concurrently on aspicrin 325mg  per day.   Patient to possibly have cath later this afternoon.  Heparin level 0.22- subtherapeutic  Goal of Therapy:  Heparin level 0.3-0.7 units/ml Monitor platelets by anticoagulation protocol: Yes   Plan:  Increase heparin drip to 1050 units/hr Check heparin level in 6-8 hours Monitor H&H and s/s of bleeding  Thanks for allowing pharmacy to be a part of this patient's care.  Thomasenia Sales, PharmD, MBA, BCGP Clinical Pharmacist  12/11/2020 5:23 PM

## 2020-12-11 NOTE — Progress Notes (Signed)
Progress Note  Patient Name: Mindy Mills Date of Encounter: 12/11/2020  [facility] Cardiologist: [place], Dr Harl Bowie  Subjective   SOB improving but ongoing.   Inpatient Medications    Scheduled Meds: . amoxicillin-clavulanate  1 tablet Oral Q12H  . aspirin  325 mg Oral Daily  . budesonide (PULMICORT) nebulizer solution  0.25 mg Nebulization BID  . FLUoxetine  80 mg Oral Daily  . furosemide  40 mg Intravenous Daily  . guaiFENesin  600 mg Oral BID  . influenza vaccine adjuvanted  0.5 mL Intramuscular Tomorrow-1000  . ipratropium-albuterol  3 mL Nebulization BID  . losartan  12.5 mg Oral Daily  . metoprolol succinate  12.5 mg Oral Daily  . pneumococcal 23 valent vaccine  0.5 mL Intramuscular Tomorrow-1000  . traZODone  200 mg Oral QHS   Continuous Infusions: . heparin 900 Units/hr (12/10/20 2343)   PRN Meds: albuterol, traMADol   Vital Signs    Vitals:   12/11/20 0443 12/11/20 0527 12/11/20 0757 12/11/20 0804  BP:  112/84    Pulse:  (!) 54    Resp:  18    Temp:  98.6 F (37 C)    TempSrc:  Oral    SpO2:  96% 92% 96%  Weight: 47.3 kg     Height:        Intake/Output Summary (Last 24 hours) at 12/11/2020 0820 Last data filed at 12/11/2020 0300 Gross per 24 hour  Intake 880 ml  Output 3700 ml  Net -2820 ml   Last 3 Weights 12/11/2020 12/07/2020 10/10/2020  Weight (lbs) 104 lb 4.4 oz 104 lb 105 lb 13.1 oz  Weight (kg) 47.3 kg 47.174 kg 48 kg      Telemetry    SR- Personally Reviewed  ECG    n/a - Personally Reviewed  Physical Exam   GEN: No acute distress.   Neck: No JVD Cardiac: RRR, no murmurs, rubs, or gallops.  Respiratory: mild coarse breath sounds GI: Soft, nontender, non-distended  MS: No edema; No deformity. Neuro:  Nonfocal  Psych: Normal affect   Labs    High Sensitivity Troponin:   Recent Labs  Lab 12/07/20 1132 12/07/20 1604 12/10/20 0442  TROPONINIHS 333* 338* 188*      Chemistry Recent Labs  Lab 12/07/20 0953  12/08/20 0705 12/10/20 0442 12/11/20 0655  NA 144 143 143 142  K 4.0 4.0 3.7 3.4*  CL 106 106 108 104  CO2 25 25 29 28   GLUCOSE 118* 110* 87 100*  BUN 44* 31* 16 14  CREATININE 1.22* 1.06* 1.04* 1.09*  CALCIUM 9.2 8.6* 7.9* 7.9*  PROT 8.0 7.3  --   --   ALBUMIN 3.5 3.2*  --   --   AST 66* 43*  --   --   ALT 95* 74*  --   --   ALKPHOS 72 60  --   --   BILITOT 0.7 0.7  --   --   GFRNONAA 48* 57* 59* 55*  ANIONGAP 13 12 6 10      Hematology Recent Labs  Lab 12/08/20 0705 12/10/20 0442 12/11/20 0655  WBC 10.1 9.5 10.1  RBC 4.97 4.53 4.68  HGB 15.2* 13.6 14.0  HCT 48.2* 44.0 45.1  MCV 97.0 97.1 96.4  MCH 30.6 30.0 29.9  MCHC 31.5 30.9 31.0  RDW 14.6 14.2 13.9  PLT 272 246 261    BNP Recent Labs  Lab 12/10/20 0442  BNP 2,426.0*     DDimer  Recent Labs  Lab 12/07/20 0953  DDIMER 5.00*     Radiology    ECHOCARDIOGRAM COMPLETE  Result Date: 12/09/2020    ECHOCARDIOGRAM REPORT   Patient Name:   MYKAILA BLUNCK Date of Exam: 12/09/2020 Medical Rec #:  [id number]        Height:       63.0 in Accession #:    [id number]       Weight:       104.0 lb Date of Birth:  1951-03-11         BSA:          1.464 m Patient Age:    15 years         BP:           115/90 mmHg Patient Gender: F                HR:           94 bpm. Exam Location:  [facility] Procedure: 2D Echo Indications:    Elevated Troponin  History:        Patient has prior history of Echocardiogram examinations, most                 recent 09/10/2015. TIA; Risk Factors:Former Smoker.                 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  Sonographer:    Leavy Cella RDCS (AE) Referring Phys: [place]  1. The mid to distal anteroseptal,anterior and anterolateral walls are hypokinetic. The apex is hypokinetic. There is a 0.7 x 1.3 cm mural  apical thrombus. . Left ventricular ejection fraction, by estimation, is 25 to 30%. The left ventricle has severely decreased function. The left ventricle demonstrates regional wall motion abnormalities (see scoring diagram/findings for description). There is moderate left ventricular hypertrophy. Left ventricular diastolic parameters are consistent with Grade II diastolic dysfunction (pseudonormalization). Elevated left atrial pressure.  2. Right ventricular systolic function is normal. The right ventricular size is normal.  3. Left atrial size was mildly dilated.  4. The mitral valve is normal in structure. Trivial mitral valve regurgitation. No evidence of mitral stenosis. Moderate mitral annular calcification.  5. The aortic valve was not well visualized. There is mild calcification of the aortic valve. There is mild thickening of the aortic valve. Aortic valve regurgitation is not visualized. No aortic stenosis is present.  6. The inferior vena cava is normal in size with greater than 50% respiratory variability, suggesting right atrial pressure of 3 mmHg. FINDINGS  Left Ventricle: The mid to distal anteroseptal,anterior and anterolateral walls are hypokinetic. The apex is hypokinetic. There is a 0.7 x 1.3 cm mural apical thrombus. Left ventricular ejection fraction, by estimation, is 25 to 30%. The left ventricle has severely decreased function. The left ventricle demonstrates regional wall motion abnormalities. The left ventricular internal cavity size was normal in size. There is moderate left ventricular hypertrophy. Left ventricular diastolic parameters are consistent with Grade II diastolic dysfunction (pseudonormalization). Elevated left atrial pressure. Right Ventricle: The right ventricular size is normal. No increase in right ventricular wall thickness. Right ventricular systolic function is normal. Left Atrium: Left atrial size  was mildly dilated. Right Atrium: Right atrial size was normal in size.  Pericardium: There is no evidence of pericardial effusion. Mitral Valve: The mitral valve is normal in structure. There is mild thickening of the mitral valve leaflet(s). There is mild calcification of the mitral valve leaflet(s). Moderate mitral annular calcification. Trivial mitral valve regurgitation. No evidence of mitral valve stenosis. Tricuspid Valve: The tricuspid valve is normal in structure. Tricuspid valve regurgitation is mild . No evidence of tricuspid stenosis. Aortic Valve: The aortic valve was not well visualized. There is mild calcification of the aortic valve. There is mild thickening of the aortic valve. There is mild aortic valve annular calcification. Aortic valve regurgitation is not visualized. No aortic stenosis is present. Aortic valve mean gradient measures 2.8 mmHg. Aortic valve peak gradient measures 5.0 mmHg. Aortic valve area, by VTI measures 1.84 cm. Pulmonic Valve: The pulmonic valve was not well visualized. Pulmonic valve regurgitation is not visualized. No evidence of pulmonic stenosis. Aorta: The aortic root is normal in size and structure. Pulmonary Artery: Indeterminant PASP, inadequate TR jet. Venous: The inferior vena cava is normal in size with greater than 50% respiratory variability, suggesting right atrial pressure of 3 mmHg. IAS/Shunts: No atrial level shunt detected by color flow Doppler.  LEFT VENTRICLE PLAX 2D LVIDd:         3.41 cm  Diastology LVIDs:         2.51 cm  LV e' medial:    3.37 cm/s LV PW:         1.28 cm  LV E/e' medial:  20.7 LV IVS:        1.45 cm  LV e' lateral:   4.90 cm/s LVOT diam:     1.90 cm  LV E/e' lateral: 14.2 LV SV:         29 LV SV Index:   20 LVOT Area:     2.84 cm  RIGHT VENTRICLE RV S prime:     7.18 cm/s TAPSE (M-mode): 1.7 cm LEFT ATRIUM             Index       RIGHT ATRIUM           Index LA diam:        3.60 cm 2.46 cm/m  RA Area:     13.90 cm LA Vol (A2C):   52.9 ml 36.12 ml/m RA Volume:   41.30 ml  28.20 ml/m LA Vol (A4C):    43.9 ml 29.98 ml/m LA Biplane Vol: 48.1 ml 32.84 ml/m  AORTIC VALVE AV Area (Vmax):    1.83 cm AV Area (Vmean):   1.61 cm AV Area (VTI):     1.84 cm AV Vmax:           111.69 cm/s AV Vmean:          80.592 cm/s AV VTI:            0.159 m AV Peak Grad:      5.0 mmHg AV Mean Grad:      2.8 mmHg LVOT Vmax:         71.98 cm/s LVOT Vmean:        45.672 cm/s LVOT VTI:          0.103 m LVOT/AV VTI ratio: 0.65  AORTA Ao Root diam: 2.90 cm MITRAL VALVE               TRICUSPID VALVE MV Area (PHT): 4.63 cm    TR Peak  grad:   37.0 mmHg MV Decel Time: 164 msec    TR Vmax:        304.00 cm/s MV E velocity: 69.80 cm/s MV A velocity: 41.10 cm/s  SHUNTS MV E/A ratio:  1.70        Systemic VTI:  0.10 m                            Systemic Diam: 1.90 cm Carlyle Dolly MD Electronically signed by Carlyle Dolly MD Signature Date/Time: 12/09/2020/12:44:45 PM    Final     Cardiac Studies     Patient Profile     Marquelle Balow Johnsonis a 69 y.o.femalewith a hx of prior systolic dysfunction that noramlized, SAH,who is being seen today for the evaluation of SOBat the request of Dr Roderic Palau.  Assessment & Plan    1. Acute systolic HF - history of systolic dysfunction in 6073 Takotsubo CM pattern in setting of SAH and seizure, LVEF later normalized - presents with pneumonia, LVEF found to be decreased again to 25-30% in a pattern also potentially suggestive of possible recurrent Takotsubo CM - exam fairly benign though markedly elevated BNP. Started on IV lasix 40mg  daily - neg 3.7L yesterday according to charting. Stable renal function. Continue IV lasix.   - started toprol 12.5mg  daily, losartan 12.5mg  daily. Soft bp's at times, would not tolerate entresto at this time or med titration. Perhaps aldactone later on in admission - possible LHC/RHC this admission one pneumonia and volume status improved  2. Elevated troponin - flat trop around 300 now trending down, EKG with precordial TWIs. Denies any chest  pains - could be demand ischemia related to pneumonia. Can also see elevated trops in setting of stress induced CM. Cannot exlcude ischemia.  - possible cath this admit, given prior history of SAH she has been cleared by neuro if needs DAPT   3. Pneumonia - possible aspiration pneumonia, management per primrary team - sats 90% on 2L , increased to 3L last night   4. Apical thrombus - noted on echo this admission in setting of apical hypokinesis - history of prior SAH, has been cleared by neuro for anticoag. Started hep gtt - Convert to coumadin later during admission as she may require invasive procedures.    5. History of SAH - in 2015 had aneurysmal bleed - seen by neuro this admit, reports bleeding risk low, can use anticoag or DAPT if indicated    For questions or updates, please contact [facility] Please consult [email or web address] for contact info under        Signed, Carlyle Dolly, MD  12/11/2020, 8:20 AM

## 2020-12-11 NOTE — Progress Notes (Signed)
Physical Therapy Treatment Patient Details Name: Mindy Mills MRN: [id number] DOB: [birth year] Today's Date: 12/11/2020    History of Present Illness Mindy Mills is a 69 y.o. 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  Patient has since had PEG tube and tracheostomy removed.  She reports that for the past 3 days, she has progressive shortness of breath and cough.  She denies any fever.  She denies any vomiting.  She has not felt as though she is been choking after eating or drinking.  She has not had any chest pain.  She feels increasingly weak.  No dysuria, diarrhea.  Since she had progressive shortness of breath, she called EMS for further evaluation    PT Comments    Patient demonstrates improvement for sitting up at bedside, safer using RW for sit to stands/transfers, but limited for taking steps due to fatigue and incontinent of stool.  Patient tolerated standing with RW for up to 7-8 minutes while being cleaned and tolerated sitting up in chair after therapy - nursing staff notified.  Patient will benefit from continued physical therapy in hospital and recommended venue below to increase strength, balance, endurance for safe ADLs and gait.    Follow Up Recommendations  SNF;Supervision for mobility/OOB;Supervision - Intermittent     Equipment Recommendations  Rolling walker with 5" wheels    Recommendations for Other Services       Precautions / Restrictions Precautions Precautions: Fall Restrictions Weight Bearing Restrictions: No    Mobility  Bed Mobility Overal bed mobility: Needs Assistance Bed Mobility: Supine to Sit     Supine to sit: Supervision     General bed mobility comments: increased time, labored movement  Transfers Overall transfer level: Needs assistance Equipment used: Rolling walker (2 wheeled) Transfers: Sit to/from  [facility] Sit to Stand: Min guard Stand pivot transfers: Min guard       General transfer comment: slightly unsteady, no loss of balance  Ambulation/Gait Ambulation/Gait assistance: Min assist Gait Distance (Feet): 5 Feet Assistive device: Rolling walker (2 wheeled) Gait Pattern/deviations: Decreased step length - right;Decreased step length - left;Decreased stride length Gait velocity: decreased   General Gait Details: limited to 5-6 slow labored unsteady steps without loss of balance mostly due to fatigue and on 5 LPM O2   Stairs             Wheelchair Mobility    Modified Rankin (Stroke Patients Only)       Balance Overall balance assessment: Needs assistance Sitting-balance support: Feet supported;Bilateral upper extremity supported Sitting balance-Leahy Scale: Good Sitting balance - Comments: seated at EOB   Standing balance support: During functional activity;Bilateral upper extremity supported Standing balance-Leahy Scale: Fair Standing balance comment: using RW                            Cognition Arousal/Alertness: Awake/alert Behavior During Therapy: WFL for tasks assessed/performed Overall Cognitive Status: Within Functional Limits for tasks assessed                                        Exercises General Exercises - Lower Extremity Long Arc Quad: Seated;AROM;Strengthening;Both;10 reps Hip Flexion/Marching: Seated;AROM;Strengthening;Both;10 reps Toe Raises: Seated;AROM;Strengthening;Both;10 reps Heel Raises: Seated;AROM;Strengthening;Both;10 reps    General Comments  Pertinent Vitals/Pain Pain Assessment: No/denies pain    Home Living                      Prior Function            PT Goals (current goals can now be found in the care plan section) Acute Rehab PT Goals Patient Stated Goal: return home with roommate to assist PT Goal Formulation: With patient Time For Goal  Achievement: 12/23/20 Potential to Achieve Goals: Good Progress towards PT goals: Progressing toward goals    Frequency    Min 3X/week      PT Plan Current plan remains appropriate    Co-evaluation              AM-PAC PT "6 Clicks" Mobility   Outcome Measure  Help needed turning from your back to your side while in a flat bed without using bedrails?: None Help needed moving from lying on your back to sitting on the side of a flat bed without using bedrails?: None Help needed moving to and from a bed to a chair (including a wheelchair)?: A Little Help needed standing up from a chair using your arms (e.g., wheelchair or bedside chair)?: A Little Help needed to walk in hospital room?: A Little Help needed climbing 3-5 steps with a railing? : A Lot 6 Click Score: 19    End of Session Equipment Utilized During Treatment: Oxygen Activity Tolerance: Patient tolerated treatment well;Patient limited by fatigue Patient left: in chair;with call bell/phone within reach Nurse Communication: Mobility status PT Visit Diagnosis: Other abnormalities of gait and mobility (R26.89);Muscle weakness (generalized) (M62.81);Unsteadiness on feet (R26.81)     Time: 7342-8768 PT Time Calculation (min) (ACUTE ONLY): 28 min  Charges:  $Therapeutic Exercise: 8-22 mins $Therapeutic Activity: 8-22 mins                     3:59 PM, 12/11/20 Lonell Grandchild, MPT Physical Therapist with [facility] [phone] office [phone] mobile phone

## 2020-12-11 NOTE — Progress Notes (Signed)
ANTICOAGULATION CONSULT NOTE - [place] for heparin infusion Indication: cardiac thrombus   Recent Labs    12/10/20 0442 12/10/20 1935 12/10/20 2133 12/11/20 0655  HGB 13.6  --   --  14.0  HCT 44.0  --   --  45.1  PLT 246  --   --  261  HEPARINUNFRC  --  0.98* 0.21* 0.39  CREATININE 1.04*  --   --  1.09*  TROPONINIHS 188*  --   --   --     Estimated Creatinine Clearance: 36.9 mL/min (A) (by C-G formula based on SCr of 1.09 mg/dL (H)).   Assessment: Pharmacy consulted to dose heparin infusion for this  69 yo female   with elevated troponins up to 300, but now trending down. EKG shows precordial TWI and baseline CBC is WNL. ECHO shows apical thrombus.  Patient   received Lovenox 40mg  last night at 2145 and is concurrently on aspicrin 325mg  per day.   Patient to possibly have cath later this afternoon.  Heparin level 0.39- therapeutic  Goal of Therapy:  Heparin level 0.3-0.7 units/ml Monitor platelets by anticoagulation protocol: Yes   Plan:  Continue heparin drip at 900 units/hr Check heparin level in 6-8 hours Monitor H&H and s/s of bleeding  Thanks for allowing pharmacy to be a part of this patient's care.  Margot Ables, PharmD Clinical Pharmacist 12/11/2020 8:49 AM

## 2020-12-12 DIAGNOSIS — J189 Pneumonia, unspecified organism: Secondary | ICD-10-CM

## 2020-12-12 DIAGNOSIS — J439 Emphysema, unspecified: Secondary | ICD-10-CM

## 2020-12-12 DIAGNOSIS — E43 Unspecified severe protein-calorie malnutrition: Secondary | ICD-10-CM | POA: Insufficient documentation

## 2020-12-12 DIAGNOSIS — R569 Unspecified convulsions: Secondary | ICD-10-CM | POA: Diagnosis not present

## 2020-12-12 DIAGNOSIS — I5021 Acute systolic (congestive) heart failure: Secondary | ICD-10-CM

## 2020-12-12 LAB — CBC
HCT: 47.4 % — ABNORMAL HIGH (ref 36.0–46.0)
Hemoglobin: 14.8 g/dL (ref 12.0–15.0)
MCH: 30.3 pg (ref 26.0–34.0)
MCHC: 31.2 g/dL (ref 30.0–36.0)
MCV: 97.1 fL (ref 80.0–100.0)
Platelets: 250 10*3/uL (ref 150–400)
RBC: 4.88 MIL/uL (ref 3.87–5.11)
RDW: 14.1 % (ref 11.5–15.5)
WBC: 8.2 10*3/uL (ref 4.0–10.5)
nRBC: 0 % (ref 0.0–0.2)

## 2020-12-12 LAB — CULTURE, BLOOD (ROUTINE X 2)
Culture: NO GROWTH
Culture: NO GROWTH
Special Requests: ADEQUATE
Special Requests: ADEQUATE

## 2020-12-12 LAB — BASIC METABOLIC PANEL
Anion gap: 9 (ref 5–15)
BUN: 16 mg/dL (ref 8–23)
CO2: 29 mmol/L (ref 22–32)
Calcium: 8.4 mg/dL — ABNORMAL LOW (ref 8.9–10.3)
Chloride: 102 mmol/L (ref 98–111)
Creatinine, Ser: 1.22 mg/dL — ABNORMAL HIGH (ref 0.44–1.00)
GFR, Estimated: 48 mL/min — ABNORMAL LOW (ref >60)
Glucose, Bld: 96 mg/dL (ref 70–99)
Potassium: 3.1 mmol/L — ABNORMAL LOW (ref 3.5–5.1)
Sodium: 140 mmol/L (ref 135–145)

## 2020-12-12 LAB — PROCALCITONIN: Procalcitonin: <0.1 ng/mL

## 2020-12-12 LAB — BRAIN NATRIURETIC PEPTIDE: B Natriuretic Peptide: 500 pg/mL — ABNORMAL HIGH (ref 0.0–100.0)

## 2020-12-12 LAB — HEPARIN LEVEL (UNFRACTIONATED)
Heparin Unfractionated: 0.58 IU/mL (ref 0.30–0.70)
Heparin Unfractionated: 0.67 IU/mL (ref 0.30–0.70)

## 2020-12-12 MED ORDER — ARFORMOTEROL TARTRATE 15 MCG/2ML IN NEBU
15.0000 ug | INHALATION_SOLUTION | Freq: Two times a day (BID) | RESPIRATORY_TRACT | Status: DC
  Administered 2020-12-12 – 2020-12-24 (×22): 15 ug via RESPIRATORY_TRACT
  Filled 2020-12-12 (×24): qty 2

## 2020-12-12 MED ORDER — SODIUM CHLORIDE 0.9 % IV BOLUS
250.0000 mL | Freq: Once | INTRAVENOUS | Status: AC
  Administered 2020-12-13: 01:00:00 250 mL via INTRAVENOUS

## 2020-12-12 MED ORDER — ONDANSETRON HCL 4 MG/2ML IJ SOLN
4.0000 mg | Freq: Four times a day (QID) | INTRAMUSCULAR | Status: DC | PRN
  Administered 2020-12-12: 18:00:00 4 mg via INTRAVENOUS
  Filled 2020-12-12: qty 2

## 2020-12-12 MED ORDER — POTASSIUM CHLORIDE CRYS ER 20 MEQ PO TBCR
40.0000 meq | EXTENDED_RELEASE_TABLET | Freq: Once | ORAL | Status: AC
  Administered 2020-12-12: 18:00:00 40 meq via ORAL
  Filled 2020-12-12: qty 2

## 2020-12-12 NOTE — Progress Notes (Signed)
Progress Note  Patient Name: Mindy Mills Date of Encounter: 12/12/2020  [facility] Cardiologist: [place], Dr Harl Bowie  Subjective   SOB is improving.   Inpatient Medications    Scheduled Meds: . amoxicillin-clavulanate  1 tablet Oral Q12H  . aspirin  325 mg Oral Daily  . budesonide (PULMICORT) nebulizer solution  0.5 mg Nebulization BID  . feeding supplement  237 mL Oral BID BM  . FLUoxetine  80 mg Oral Daily  . furosemide  40 mg Intravenous Daily  . guaiFENesin  600 mg Oral BID  . ipratropium-albuterol  3 mL Nebulization BID  . losartan  12.5 mg Oral Daily  . metoprolol succinate  12.5 mg Oral Daily  . multivitamin with minerals  1 tablet Oral Daily  . traZODone  200 mg Oral QHS   Continuous Infusions: . heparin 1,050 Units/hr (12/11/20 1808)   PRN Meds: albuterol, traMADol   Vital Signs    Vitals:   12/11/20 2000 12/12/20 0628 12/12/20 0815 12/12/20 0821  BP: (!) 87/57 (!) 101/58    Pulse: 70 61    Resp:  18    Temp:  98 F (36.7 C)    TempSrc:  Oral    SpO2:  98% 92% 96%  Weight:  47.9 kg    Height:        Intake/Output Summary (Last 24 hours) at 12/12/2020 0842 Last data filed at 12/12/2020 0700 Gross per 24 hour  Intake --  Output 150 ml  Net -150 ml   Last 3 Weights 12/12/2020 12/11/2020 12/07/2020  Weight (lbs) 105 lb 9.6 oz 104 lb 4.4 oz 104 lb  Weight (kg) 47.9 kg 47.3 kg 47.174 kg      Telemetry    SR - Personally Reviewed  ECG    n/a - Personally Reviewed  Physical Exam   GEN: No acute distress.   Neck: No JVD Cardiac: RRR, no murmurs, rubs, or gallops.  Respiratory: mild coarse breath sounds GI: Soft, nontender, non-distended  MS: No edema; No deformity. Neuro:  Nonfocal  Psych: Normal affect   Labs    High Sensitivity Troponin:   Recent Labs  Lab 12/07/20 1132 12/07/20 1604 12/10/20 0442  TROPONINIHS 333* 338* 188*      Chemistry Recent Labs  Lab 12/07/20 0953 12/08/20 0705 12/10/20 0442 12/11/20 0655  12/12/20 0634  NA 144 143 143 142 140  K 4.0 4.0 3.7 3.4* 3.1*  CL 106 106 108 104 102  CO2 25 25 29 28 29   GLUCOSE 118* 110* 87 100* 96  BUN 44* 31* 16 14 16   CREATININE 1.22* 1.06* 1.04* 1.09* 1.22*  CALCIUM 9.2 8.6* 7.9* 7.9* 8.4*  PROT 8.0 7.3  --   --   --   ALBUMIN 3.5 3.2*  --   --   --   AST 66* 43*  --   --   --   ALT 95* 74*  --   --   --   ALKPHOS 72 60  --   --   --   BILITOT 0.7 0.7  --   --   --   GFRNONAA 48* 57* 59* 55* 48*  ANIONGAP 13 12 6 10 9      Hematology Recent Labs  Lab 12/10/20 0442 12/11/20 0655 12/12/20 0634  WBC 9.5 10.1 8.2  RBC 4.53 4.68 4.88  HGB 13.6 14.0 14.8  HCT 44.0 45.1 47.4*  MCV 97.1 96.4 97.1  MCH 30.0 29.9 30.3  MCHC 30.9 31.0 31.2  RDW 14.2 13.9 14.1  PLT 246 261 250    BNP Recent Labs  Lab 12/10/20 0442 12/12/20 0634  BNP 2,426.0* 500.0*     DDimer  Recent Labs  Lab 12/07/20 0953  DDIMER 5.00*     Radiology    No results found.  Cardiac Studies     Patient Profile     Kollyns Mickelson Johnsonis a 69 y.o.femalewith a hx of prior systolic dysfunction that noramlized, SAH,who is being seen today for the evaluation of SOBat the request of Dr Roderic Palau.  Assessment & Plan    1. Acute systolic HF - history of systolic dysfunction in 2355 Takotsubo CM pattern in setting of SAH and seizure, LVEF later normalized - presents with pneumonia, LVEF found to be decreased again to 25-30% in a pattern also potentially suggestive of possible recurrent Takotsubo CM  -exam fairly benign though markedly elevated BNP. Started on IV lasix 40mg  daily -I/Os are incomplete, weights appear inaccurate (will order standing weight). BNP down significantly from 2400 to 500 this AM. Mild uptrend in Cr. Likely last IV dose of lasix today.   - started toprol 12.5mg  daily, losartan 12.5mg  daily. Soft bp's at times, would not tolerate entresto at this time or med titration. Perhaps aldactone later on in admission - possible LHC/RHC this  admission one pneumonia and volume status improved. Still on 4L [place] sats low 90s,maybe cath Monday  2. Elevated troponin - flat trop around 300now trending down, EKG with precordial TWIs. Denies any chest pains - could be demand ischemia related to pneumonia. Can also see elevated trops in setting of stress induced CM. Cannot exlcude ischemia.  - possible cath this admit, given prior history of SAH she has been cleared by neuro if needs DAPT   3. Pneumonia - possible aspiration pneumonia, management per primrary team    4. Apical thrombus - noted on echo this admission in setting of apical hypokinesis - history of prior SAH, has been cleared by neuro for anticoag. Started hep gtt - Convert to coumadin later during admission as she may require invasive procedures.    5. History of SAH - in 2015 had aneurysmal bleed - seen by neuro this admit, reports bleeding risk low, can use anticoag or DAPT if indicated  For questions or updates, please contact [facility] Please consult [email or web address] for contact info under        Signed, Carlyle Dolly, MD  12/12/2020, 8:42 AM

## 2020-12-12 NOTE — Progress Notes (Signed)
ANTICOAGULATION CONSULT NOTE - [place] for heparin infusion Indication: cardiac thrombus   Recent Labs    12/10/20 0442 12/10/20 1935 12/11/20 0655 12/11/20 1607 12/11/20 2325 12/12/20 0634  HGB 13.6  --  14.0  --   --  14.8  HCT 44.0  --  45.1  --   --  47.4*  PLT 246  --  261  --   --  250  HEPARINUNFRC  --    < > 0.39 0.22* 0.58 0.67  CREATININE 1.04*  --  1.09*  --   --  1.22*  TROPONINIHS 188*  --   --   --   --   --    < > = values in this interval not displayed.    Estimated Creatinine Clearance: 33.4 mL/min (A) (by C-G formula based on SCr of 1.22 mg/dL (H)).   Assessment: Pharmacy consulted to dose heparin infusion for this  69 yo female   with elevated troponins up to 300, but now trending down. EKG shows precordial TWI and baseline CBC is WNL. ECHO shows apical thrombus.  Patient   received Lovenox 40mg  last night at 2145 and is concurrently on aspicrin 325mg  per day.   Patient to possibly have cath later this admission.   Goal of Therapy:  Heparin level 0.3-0.7 units/ml Monitor platelets by anticoagulation protocol: Yes   Plan:  Cont heparin drip at 1050 units/hr heparin level with AM labs Monitor H&H and s/s of bleeding  Margot Ables, PharmD Clinical Pharmacist 12/12/2020 9:42 AM

## 2020-12-12 NOTE — Progress Notes (Signed)
ANTICOAGULATION CONSULT NOTE - [place] for heparin infusion Indication: cardiac thrombus   Recent Labs    12/10/20 0442 12/10/20 1935 12/11/20 0655 12/11/20 1607 12/11/20 2325  HGB 13.6  --  14.0  --   --   HCT 44.0  --  45.1  --   --   PLT 246  --  261  --   --   HEPARINUNFRC  --    < > 0.39 0.22* 0.58  CREATININE 1.04*  --  1.09*  --   --   TROPONINIHS 188*  --   --   --   --    < > = values in this interval not displayed.    Estimated Creatinine Clearance: 36.9 mL/min (A) (by C-G formula based on SCr of 1.09 mg/dL (H)).   Assessment: Pharmacy consulted to dose heparin infusion for this  69 yo female   with elevated troponins up to 300, but now trending down. EKG shows precordial TWI and baseline CBC is WNL. ECHO shows apical thrombus.  Patient   received Lovenox 40mg  last night at 2145 and is concurrently on aspicrin 325mg  per day.   Patient to possibly have cath later this afternoon.  12/16 AM update:  Heparin level therapeutic after rate increase  Goal of Therapy:  Heparin level 0.3-0.7 units/ml Monitor platelets by anticoagulation protocol: Yes   Plan:  Cont heparin drip at 1050 units/hr Confirmatory heparin level with AM labs Monitor H&H and s/s of bleeding  Narda Bonds, PharmD, BCPS Clinical Pharmacist Phone: [phone]

## 2020-12-12 NOTE — Progress Notes (Signed)
PROGRESS NOTE  Mindy Mills OTL:[id number] DOB: [birth year] DOA: 12/07/2020 PCP: Patient, No Pcp Per  Brief History:  69 y.o.femalewith medical history significant ofmigraines, previous intracerebral hemorrhage in 2016 which resulted in prolonged course including tracheostomy and PEG tube. Patient has since had PEG tube and tracheostomy removed. She reports that for the past 3 days, she has progressive shortness of breath and cough. She denies any fever. She denies any vomiting. She has not felt as though she is been choking after eating or drinking. She has not had any chest pain. She feels increasingly weak. No dysuria, diarrhea. Since she had progressive shortness of breath, she called EMS for further evaluation  ED Course:Upon arrival to the emergency room, patient is noted to have a mildly elevated creatinine. Oxygen has been applied since she was noted to be hypoxic. Oxygen saturations were 88% on room air. D-dimer was noted to be elevated and CT of the chest was performed that indicated evidence of aspiration pneumonia. COVID-19 test was found to be negative. Troponin mildly elevated around 300 and EKG showed T wave inversions in the anterolateral leads. She was referred for admission.  Assessment/Plan:  Acute respiratory failure with hypoxia -Secondary to pneumonia and CHF in setting of COPD -currently stable on 4L -Wean off oxygen as tolerated -wean oxygen for saturation 88-92%  Aspiration pneumonia -12/1 CTA chest--no PE; extensive fluid and debris RLL bronchus -Started on Unasyn 12/12>>12.14 -Continue pulmonary hygiene with bronchodilators and mucolytic's -seen by speech therapy with recommendations for D3 diet -Change Unasyn to [id number]  Acute systolic CHF -80/32/12 EF 25-30%, G2DD, +apical thrombus, trivial MR -previous history of Takotsubo CM in 2015 -continue IV lasix--had 3 doses, last on 12/16 -cardiology following, appreciate  input -continue toprol, losartan -plans noted for possible cardiac cath  COPD -continue duoneb/pulmicort -start brovana -50 pack year hx -IS  Elevated troponin -Patient did not complain of any chest pain -She does have some EKG changes, mainly TWI in precordial leads -Troponin elevated at 333, delta troponin flat at 338 -Echo shows low EF -appreciate cardiology assistance -could be demand ischemia related to pneumonia. Can also see elevated trops in setting of stress induced CM. Cannot exlcude ischemia. 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 -Patient had aneurysm coiled at that time -Seen by neurology and felt reasonable to start on dual antiplatelet therapy versus anticoagulation as needed  CKD IIIa -Creatinine is currently at baseline -baseline 1.0-1.2 -monitor with diuresis  Generalized weakness -seen by physical therapy with recommendations for SNF--pt refuses  Hypokalemia -replete -check mag  Severe Malnutrition -continue nutrition supplements      Status is: Inpatient  Remains inpatient appropriate because:IV treatments appropriate due to intensity of illness or inability to take PO   Dispo: The patient is from: Home  Anticipated d/c is to: Home  Anticipated d/c date is: 2 days  Patient currently is not medically stable to d/c.        Family Communication:   No Family at bedside  Consultants:  Cardiology  Code Status:  FULL   DVT Prophylaxis:  IV Heparin   Procedures: As Listed in Progress Note Above  Antibiotics: Unasyn 12/12>>12/14 Amox/clav 12/14>>     Subjective: Patient states sob is improving.  Denies cp, n/v/d, abd pain, f/c  Objective: Vitals:   12/12/20 2482 12/12/20 0815 12/12/20 0821 12/12/20 1331  BP: (!) 101/58   100/61  Pulse: 61   69   Resp: 18   18  Temp: 98 F (36.7 C)   98.4 F (36.9 C)  TempSrc: Oral   Oral  SpO2: 98% 92% 96% 96%  Weight: 47.9 kg     Height:        Intake/Output Summary (Last 24 hours) at 12/12/2020 1737 Last data filed at 12/12/2020 1300 Gross per 24 hour  Intake 480 ml  Output 150 ml  Net 330 ml   Weight change: 0.6 kg Exam:   General:  Pt is alert, follows commands appropriately, not in acute distress  HEENT: No icterus, No thrush, No neck mass, Frank/AT  Cardiovascular: RRR, S1/S2, no rubs, no gallops  Respiratory: bibasilar rales. No wheeze  Abdomen: Soft/+BS, non tender, non distended, no guarding  Extremities: No edema, No lymphangitis, No petechiae, No rashes, no synovitis   Data Reviewed: I have personally reviewed following labs and imaging studies Basic Metabolic Panel: Recent Labs  Lab 12/07/20 0953 12/08/20 0705 12/10/20 0442 12/11/20 0655 12/12/20 0634  NA 144 143 143 142 140  K 4.0 4.0 3.7 3.4* 3.1*  CL 106 106 108 104 102  CO2 25 25 29 28 29   GLUCOSE 118* 110* 87 100* 96  BUN 44* 31* 16 14 16   CREATININE 1.22* 1.06* 1.04* 1.09* 1.22*  CALCIUM 9.2 8.6* 7.9* 7.9* 8.4*   Liver Function Tests: Recent Labs  Lab 12/07/20 0953 12/08/20 0705  AST 66* 43*  ALT 95* 74*  ALKPHOS 72 60  BILITOT 0.7 0.7  PROT 8.0 7.3  ALBUMIN 3.5 3.2*   No results for input(s): LIPASE, AMYLASE in the last 168 hours. No results for input(s): AMMONIA in the last 168 hours. Coagulation Profile: No results for input(s): INR, PROTIME in the last 168 hours. CBC: Recent Labs  Lab 12/07/20 0953 12/08/20 0705 12/10/20 0442 12/11/20 0655 12/12/20 0634  WBC 14.9* 10.1 9.5 10.1 8.2  NEUTROABS 12.1*  --   --   --   --   HGB 16.8* 15.2* 13.6 14.0 14.8  HCT 53.1* 48.2* 44.0 45.1 47.4*  MCV 96.0 97.0 97.1 96.4 97.1  PLT 319 272 246 261 250   Cardiac Enzymes: Recent Labs  Lab 12/07/20 1604  CKTOTAL 348*   BNP: Invalid input(s): POCBNP CBG: Recent Labs  Lab  12/08/20 1557  GLUCAP 104*   HbA1C: No results for input(s): HGBA1C in the last 72 hours. Urine analysis:    Component Value Date/Time   COLORURINE YELLOW 09/08/2015 1817   APPEARANCEUR CLEAR 09/08/2015 1817   LABSPEC 1.038 (H) 09/08/2015 1817   PHURINE 6.5 09/08/2015 1817   GLUCOSEU NEGATIVE 09/08/2015 1817   HGBUR LARGE (A) 09/08/2015 1817   BILIRUBINUR NEGATIVE 09/08/2015 1817   KETONESUR NEGATIVE 09/08/2015 1817   PROTEINUR 30 (A) 09/08/2015 1817   UROBILINOGEN 1.0 09/08/2015 1817   NITRITE NEGATIVE 09/08/2015 1817   LEUKOCYTESUR SMALL (A) 09/08/2015 1817   Sepsis Labs: @LABRCNTIP (procalcitonin:4,lacticidven:4) ) Recent Results (from the past 240 hour(s))  Resp Panel by RT-PCR (Flu A&B, Covid) Nasopharyngeal Swab     Status: None   Collection Time: 12/07/20  9:37 AM   Specimen: Nasopharyngeal Swab; Nasopharyngeal(NP) swabs in vial transport medium  Result Value Ref Range Status   SARS Coronavirus 2 by RT PCR NEGATIVE NEGATIVE Final    Comment: (NOTE) SARS-CoV-2 target nucleic acids are NOT DETECTED.  The SARS-CoV-2 RNA is generally detectable in upper respiratory specimens during the acute phase of infection.  The lowest concentration of SARS-CoV-2 viral copies this assay can detect is 138 copies/mL. A negative result does not preclude SARS-Cov-2 infection and should not be used as the sole basis for treatment or other patient management decisions. A negative result may occur with  improper specimen collection/handling, submission of specimen other than nasopharyngeal swab, presence of viral mutation(s) within the areas targeted by this assay, and inadequate number of viral copies(<138 copies/mL). A negative result must be combined with clinical observations, patient history, and epidemiological information. The expected result is Negative.  Fact Sheet for Patients:  EntrepreneurPulse.com.au  Fact Sheet for Healthcare Providers:   IncredibleEmployment.be  This test is no t yet approved or cleared by the [place] FDA and  has been authorized for detection and/or diagnosis of SARS-CoV-2 by FDA under an Emergency Use Authorization (EUA). This EUA will remain  in effect (meaning this test can be used) for the duration of the COVID-19 declaration under Section 564(b)(1) of the Act, 21 U.S.C.section 360bbb-3(b)(1), unless the authorization is terminated  or revoked sooner.       Influenza A by PCR NEGATIVE NEGATIVE Final   Influenza B by PCR NEGATIVE NEGATIVE Final    Comment: (NOTE) The Xpert Xpress SARS-CoV-2/FLU/RSV plus assay is intended as an aid in the diagnosis of influenza from Nasopharyngeal swab specimens and should not be used as a sole basis for treatment. Nasal washings and aspirates are unacceptable for Xpert Xpress SARS-CoV-2/FLU/RSV testing.  Fact Sheet for Patients: EntrepreneurPulse.com.au  Fact Sheet for Healthcare Providers: IncredibleEmployment.be  This test is not yet approved or cleared by the [place] FDA and has been authorized for detection and/or diagnosis of SARS-CoV-2 by FDA under an Emergency Use Authorization (EUA). This EUA will remain in effect (meaning this test can be used) for the duration of the COVID-19 declaration under Section 564(b)(1) of the Act, 21 U.S.C. section 360bbb-3(b)(1), unless the authorization is terminated or revoked.  Performed at [facility], [address] [place], [place] [address]   Blood Culture (routine x 2)     Status: None   Collection Time: 12/07/20  9:53 AM   Specimen: Right Antecubital; Blood  Result Value Ref Range Status   Specimen Description   Final    RIGHT ANTECUBITAL BOTTLES DRAWN AEROBIC AND ANAEROBIC   Special Requests Blood Culture adequate volume  Final   Culture   Final    NO GROWTH 5 DAYS Performed at [facility], [address], [place], [place] [address]     Report Status 12/12/2020 FINAL  Final  Blood Culture (routine x 2)     Status: None   Collection Time: 12/07/20 11:22 AM   Specimen: Right Antecubital; Blood  Result Value Ref Range Status   Specimen Description   Final    RIGHT ANTECUBITAL BOTTLES DRAWN AEROBIC AND ANAEROBIC   Special Requests Blood Culture adequate volume  Final   Culture   Final    NO GROWTH 5 DAYS Performed at [facility], [address] [place], [place] [address]    Report Status 12/12/2020 FINAL  Final     Scheduled Meds: . amoxicillin-clavulanate  1 tablet Oral Q12H  . arformoterol  15 mcg Nebulization BID  . aspirin  325 mg Oral Daily  . budesonide (PULMICORT) nebulizer solution  0.5 mg Nebulization BID  . feeding supplement  237 mL Oral BID BM  . FLUoxetine  80 mg Oral Daily  . guaiFENesin  600 mg Oral BID  . ipratropium-albuterol  3 mL Nebulization  BID  . losartan  12.5 mg Oral Daily  . metoprolol succinate  12.5 mg Oral Daily  . multivitamin with minerals  1 tablet Oral Daily  . traZODone  200 mg Oral QHS   Continuous Infusions: . heparin 1,050 Units/hr (12/12/20 1413)    Procedures/Studies: CT Angio Chest PE W/Cm &/Or Wo Cm  Result Date: 12/07/2020 CLINICAL DATA:  Chest pain for several days and elevated D-dimer. EXAM: CT ANGIOGRAPHY CHEST WITH CONTRAST TECHNIQUE: Multidetector CT imaging of the chest was performed using the standard protocol during bolus administration of intravenous contrast. Multiplanar CT image reconstructions and MIPs were obtained to evaluate the vascular anatomy. CONTRAST:  43mL OMNIPAQUE IOHEXOL 350 MG/ML SOLN COMPARISON:  None. FINDINGS: Cardiovascular: The heart is borderline enlarged for age. No pericardial effusion. Significant age advanced atherosclerotic calcifications involving the thoracic aorta and branch vessels. Three-vessel coronary artery calcifications are noted. The pulmonary arterial tree is well opacified. No filling defects to suggest pulmonary embolism.  Mediastinum/Nodes: Borderline right hilar lymph nodes likely reactive/inflammatory. No mediastinal adenopathy. There is a large hiatal hernia. Lungs/Pleura: Severe emphysematous changes and pulmonary scarring. Extensive fluid and debris filling the right lower lobe bronchus and its branches highly suggestive acute aspiration. There is also mild peribronchial thickening and peribronchial inflammatory changes. Right lower lobe atelectasis is noted adjacent to the hiatal hernia. No pleural effusions. No worrisome pulmonary lesions. Upper Abdomen: Chronic appearing left-sided hydronephrosis with fairly significant renal cortical thinning. Aortic and branch vessel calcifications are noted. Low-attenuation left adrenal gland lesion, likely benign adenoma. Musculoskeletal: No breast masses, supraclavicular or axillary adenopathy. The bony thorax is intact. No worrisome bone lesions or acute fractures. Review of the MIP images confirms the above findings. IMPRESSION: 1. No CT findings for pulmonary embolism. 2. Significant age advanced atherosclerotic calcifications involving the thoracic aorta and branch vessels including the coronary arteries. 3. Severe emphysematous changes and pulmonary scarring. 4. Extensive fluid and debris filling the right lower lobe bronchus and its branches highly suggestive of acute aspiration. There is also mild peribronchial thickening and peribronchial inflammatory changes. 5. Large hiatal hernia. 6. Chronic appearing left-sided hydronephrosis with fairly significant renal cortical thinning. 7. Emphysema and aortic atherosclerosis. Aortic Atherosclerosis (ICD10-I70.0) and Emphysema (ICD10-J43.9). Electronically Signed   By: Marijo Sanes M.D.   On: 12/07/2020 12:33   DG Chest Port 1 View  Result Date: 12/07/2020 CLINICAL DATA:  cough EXAM: PORTABLE CHEST 1 VIEW COMPARISON:  10/10/2020 and prior. FINDINGS: No focal consolidation. No pneumothorax or pleural effusion. Stable  cardiomediastinal silhouette including moderate hiatal hernia. Aortic atherosclerotic calcifications. No acute osseous abnormality. IMPRESSION: No focal airspace disease. Electronically Signed   By: Primitivo Gauze M.D.   On: 12/07/2020 10:12   ECHOCARDIOGRAM COMPLETE  Result Date: 12/09/2020    ECHOCARDIOGRAM REPORT   Patient Name:   KATHERINE TOUT Date of Exam: 12/09/2020 Medical Rec #:  [id number]        Height:       63.0 in Accession #:    [id number]       Weight:       104.0 lb Date of Birth:  12-Feb-1951         BSA:          1.464 m Patient Age:    20 years         BP:           115/90 mmHg Patient Gender: F  HR:           94 bpm. Exam Location:  [facility] Procedure: 2D Echo Indications:    Elevated Troponin  History:        Patient has prior history of Echocardiogram examinations, most                 recent 09/10/2015. TIA; Risk Factors:Former Smoker.                 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  Sonographer:    Leavy Cella RDCS (AE) Referring Phys: [place]  1. The mid to distal anteroseptal,anterior and anterolateral walls are hypokinetic. The apex is hypokinetic. There is a 0.7 x 1.3 cm mural apical thrombus. . Left ventricular ejection fraction, by estimation, is 25 to 30%. The left ventricle has severely decreased function. The left ventricle demonstrates regional wall motion abnormalities (see scoring diagram/findings for description). There is moderate left ventricular hypertrophy. Left ventricular diastolic parameters are consistent with Grade II diastolic dysfunction (pseudonormalization). Elevated left atrial pressure.  2. Right ventricular systolic function is normal. The right ventricular size is normal.  3. Left atrial size was mildly dilated.  4. The mitral valve is normal in  structure. Trivial mitral valve regurgitation. No evidence of mitral stenosis. Moderate mitral annular calcification.  5. The aortic valve was not well visualized. There is mild calcification of the aortic valve. There is mild thickening of the aortic valve. Aortic valve regurgitation is not visualized. No aortic stenosis is present.  6. The inferior vena cava is normal in size with greater than 50% respiratory variability, suggesting right atrial pressure of 3 mmHg. FINDINGS  Left Ventricle: The mid to distal anteroseptal,anterior and anterolateral walls are hypokinetic. The apex is hypokinetic. There is a 0.7 x 1.3 cm mural apical thrombus. Left ventricular ejection fraction, by estimation, is 25 to 30%. The left ventricle has severely decreased function. The left ventricle demonstrates regional wall motion abnormalities. The left ventricular internal cavity size was normal in size. There is moderate left ventricular hypertrophy. Left ventricular diastolic parameters are consistent with Grade II diastolic dysfunction (pseudonormalization). Elevated left atrial pressure. Right Ventricle: The right ventricular size is normal. No increase in right ventricular wall thickness. Right ventricular systolic function is normal. Left Atrium: Left atrial size was mildly dilated. Right Atrium: Right atrial size was normal in size. Pericardium: There is no evidence of pericardial effusion. Mitral Valve: The mitral valve is normal in structure. There is mild thickening of the mitral valve leaflet(s). There is mild calcification of the mitral valve leaflet(s). Moderate mitral annular calcification. Trivial mitral valve regurgitation. No evidence of mitral valve stenosis. Tricuspid Valve: The tricuspid valve is normal in structure. Tricuspid valve regurgitation is mild . No evidence of tricuspid stenosis. Aortic Valve: The aortic valve was not well visualized. There is mild calcification of the aortic valve. There is mild  thickening of the aortic valve. There is mild aortic valve annular calcification. Aortic valve regurgitation is not visualized. No aortic stenosis is present. Aortic valve mean gradient measures 2.8 mmHg. Aortic valve peak gradient measures 5.0 mmHg. Aortic valve area, by VTI measures 1.84 cm. Pulmonic Valve: The pulmonic valve was not  well visualized. Pulmonic valve regurgitation is not visualized. No evidence of pulmonic stenosis. Aorta: The aortic root is normal in size and structure. Pulmonary Artery: Indeterminant PASP, inadequate TR jet. Venous: The inferior vena cava is normal in size with greater than 50% respiratory variability, suggesting right atrial pressure of 3 mmHg. IAS/Shunts: No atrial level shunt detected by color flow Doppler.  LEFT VENTRICLE PLAX 2D LVIDd:         3.41 cm  Diastology LVIDs:         2.51 cm  LV e' medial:    3.37 cm/s LV PW:         1.28 cm  LV E/e' medial:  20.7 LV IVS:        1.45 cm  LV e' lateral:   4.90 cm/s LVOT diam:     1.90 cm  LV E/e' lateral: 14.2 LV SV:         29 LV SV Index:   20 LVOT Area:     2.84 cm  RIGHT VENTRICLE RV S prime:     7.18 cm/s TAPSE (M-mode): 1.7 cm LEFT ATRIUM             Index       RIGHT ATRIUM           Index LA diam:        3.60 cm 2.46 cm/m  RA Area:     13.90 cm LA Vol (A2C):   52.9 ml 36.12 ml/m RA Volume:   41.30 ml  28.20 ml/m LA Vol (A4C):   43.9 ml 29.98 ml/m LA Biplane Vol: 48.1 ml 32.84 ml/m  AORTIC VALVE AV Area (Vmax):    1.83 cm AV Area (Vmean):   1.61 cm AV Area (VTI):     1.84 cm AV Vmax:           111.69 cm/s AV Vmean:          80.592 cm/s AV VTI:            0.159 m AV Peak Grad:      5.0 mmHg AV Mean Grad:      2.8 mmHg LVOT Vmax:         71.98 cm/s LVOT Vmean:        45.672 cm/s LVOT VTI:          0.103 m LVOT/AV VTI ratio: 0.65  AORTA Ao Root diam: 2.90 cm MITRAL VALVE               TRICUSPID VALVE MV Area (PHT): 4.63 cm    TR Peak grad:   37.0 mmHg MV Decel Time: 164 msec    TR Vmax:        304.00 cm/s MV E  velocity: 69.80 cm/s MV A velocity: 41.10 cm/s  SHUNTS MV E/A ratio:  1.70        Systemic VTI:  0.10 m                            Systemic Diam: 1.90 cm Carlyle Dolly MD Electronically signed by Carlyle Dolly MD Signature Date/Time: 12/09/2020/12:44:45 PM    Final     Orson Eva, DO  [facility]  If 7PM-7AM, please contact night-coverage [email or web address] Password [email or web address] 12/12/2020, 5:37 PM   LOS: 5 days

## 2020-12-13 ENCOUNTER — Inpatient Hospital Stay (HOSPITAL_COMMUNITY): Payer: Medicare Other

## 2020-12-13 DIAGNOSIS — G8191 Hemiplegia, unspecified affecting right dominant side: Secondary | ICD-10-CM | POA: Diagnosis not present

## 2020-12-13 DIAGNOSIS — I639 Cerebral infarction, unspecified: Secondary | ICD-10-CM

## 2020-12-13 LAB — URINALYSIS, COMPLETE (UACMP) WITH MICROSCOPIC
Bacteria, UA: NONE SEEN
Bilirubin Urine: NEGATIVE
Glucose, UA: NEGATIVE mg/dL
Hgb urine dipstick: NEGATIVE
Ketones, ur: 5 mg/dL — AB
Leukocytes,Ua: NEGATIVE
Nitrite: NEGATIVE
Protein, ur: NEGATIVE mg/dL
Specific Gravity, Urine: >1.046 — ABNORMAL HIGH (ref 1.005–1.030)
pH: 6 (ref 5.0–8.0)

## 2020-12-13 LAB — COMPREHENSIVE METABOLIC PANEL
ALT: 23 U/L (ref 0–44)
AST: 25 U/L (ref 15–41)
Albumin: 2.6 g/dL — ABNORMAL LOW (ref 3.5–5.0)
Alkaline Phosphatase: 47 U/L (ref 38–126)
Anion gap: 8 (ref 5–15)
BUN: 15 mg/dL (ref 8–23)
CO2: 26 mmol/L (ref 22–32)
Calcium: 8.2 mg/dL — ABNORMAL LOW (ref 8.9–10.3)
Chloride: 105 mmol/L (ref 98–111)
Creatinine, Ser: 1.17 mg/dL — ABNORMAL HIGH (ref 0.44–1.00)
GFR, Estimated: 51 mL/min — ABNORMAL LOW (ref >60)
Glucose, Bld: 111 mg/dL — ABNORMAL HIGH (ref 70–99)
Potassium: 4 mmol/L (ref 3.5–5.1)
Sodium: 139 mmol/L (ref 135–145)
Total Bilirubin: 0.6 mg/dL (ref 0.3–1.2)
Total Protein: 6 g/dL — ABNORMAL LOW (ref 6.5–8.1)

## 2020-12-13 LAB — CBC
HCT: 42.3 % (ref 36.0–46.0)
HCT: 40.5 % (ref 36.0–46.0)
Hemoglobin: 13.2 g/dL (ref 12.0–15.0)
Hemoglobin: 12.8 g/dL (ref 12.0–15.0)
MCH: 30.5 pg (ref 26.0–34.0)
MCH: 30.7 pg (ref 26.0–34.0)
MCHC: 31.2 g/dL (ref 30.0–36.0)
MCHC: 31.6 g/dL (ref 30.0–36.0)
MCV: 97.7 fL (ref 80.0–100.0)
MCV: 97.1 fL (ref 80.0–100.0)
Platelets: 246 10*3/uL (ref 150–400)
Platelets: 256 10*3/uL (ref 150–400)
RBC: 4.33 MIL/uL (ref 3.87–5.11)
RBC: 4.17 MIL/uL (ref 3.87–5.11)
RDW: 14 % (ref 11.5–15.5)
RDW: 14 % (ref 11.5–15.5)
WBC: 9.7 10*3/uL (ref 4.0–10.5)
WBC: 9.2 10*3/uL (ref 4.0–10.5)
nRBC: 0 % (ref 0.0–0.2)
nRBC: 0 % (ref 0.0–0.2)

## 2020-12-13 LAB — BASIC METABOLIC PANEL
Anion gap: 7 (ref 5–15)
BUN: 17 mg/dL (ref 8–23)
CO2: 28 mmol/L (ref 22–32)
Calcium: 8.3 mg/dL — ABNORMAL LOW (ref 8.9–10.3)
Chloride: 104 mmol/L (ref 98–111)
Creatinine, Ser: 1.25 mg/dL — ABNORMAL HIGH (ref 0.44–1.00)
GFR, Estimated: 47 mL/min — ABNORMAL LOW (ref >60)
Glucose, Bld: 100 mg/dL — ABNORMAL HIGH (ref 70–99)
Potassium: 3.7 mmol/L (ref 3.5–5.1)
Sodium: 139 mmol/L (ref 135–145)

## 2020-12-13 LAB — APTT: aPTT: 89 seconds — ABNORMAL HIGH (ref 24–36)

## 2020-12-13 LAB — HEPARIN LEVEL (UNFRACTIONATED)
Heparin Unfractionated: 0.85 IU/mL — ABNORMAL HIGH (ref 0.30–0.70)
Heparin Unfractionated: 0.39 IU/mL (ref 0.30–0.70)

## 2020-12-13 LAB — AMMONIA: Ammonia: 19 umol/L (ref 9–35)

## 2020-12-13 LAB — MAGNESIUM: Magnesium: 2 mg/dL (ref 1.7–2.4)

## 2020-12-13 LAB — PROTIME-INR
INR: 1 (ref 0.8–1.2)
Prothrombin Time: 13.2 seconds (ref 11.4–15.2)

## 2020-12-13 LAB — GLUCOSE, CAPILLARY: Glucose-Capillary: 94 mg/dL (ref 70–99)

## 2020-12-13 MED ORDER — ASPIRIN 300 MG RE SUPP
300.0000 mg | Freq: Every day | RECTAL | Status: DC
  Administered 2020-12-14: 09:00:00 300 mg via RECTAL
  Filled 2020-12-13 (×2): qty 1

## 2020-12-13 MED ORDER — SODIUM CHLORIDE 0.9 % IV SOLN
3.0000 g | Freq: Four times a day (QID) | INTRAVENOUS | Status: DC
  Administered 2020-12-13 – 2020-12-14 (×6): 3 g via INTRAVENOUS
  Filled 2020-12-13 (×8): qty 8
  Filled 2020-12-13: qty 3
  Filled 2020-12-13: qty 8
  Filled 2020-12-13: qty 3
  Filled 2020-12-13 (×4): qty 8

## 2020-12-13 MED ORDER — FUROSEMIDE 40 MG PO TABS
40.0000 mg | ORAL_TABLET | Freq: Every day | ORAL | Status: DC
  Administered 2020-12-14 – 2020-12-20 (×7): 40 mg via ORAL
  Filled 2020-12-13 (×7): qty 1

## 2020-12-13 MED ORDER — IOHEXOL 350 MG/ML SOLN
100.0000 mL | Freq: Once | INTRAVENOUS | Status: AC | PRN
  Administered 2020-12-13: 12:00:00 100 mL via INTRAVENOUS

## 2020-12-13 NOTE — Plan of Care (Signed)
  Problem: SLP Dysphagia Goals Goal: Patient will utilize recommended strategies Description: Patient will utilize recommended strategies during swallow to increase swallowing safety with Flowsheets (Taken 12/13/2020 1919) Patient will utilize recommended strategies during swallow to increase swallowing safety with: mod assist   Thank you,  Genene Churn, [place]

## 2020-12-13 NOTE — Progress Notes (Signed)
Progress Note  Patient Name: Mindy Mills Date of Encounter: 12/13/2020  [facility] Cardiologist: Bea Graff  Subjective   Letharic this AM, denies any pain or SOB  Inpatient Medications    Scheduled Meds: . amoxicillin-clavulanate  1 tablet Oral Q12H  . arformoterol  15 mcg Nebulization BID  . aspirin  325 mg Oral Daily  . budesonide (PULMICORT) nebulizer solution  0.5 mg Nebulization BID  . feeding supplement  237 mL Oral BID BM  . FLUoxetine  80 mg Oral Daily  . guaiFENesin  600 mg Oral BID  . ipratropium-albuterol  3 mL Nebulization BID  . losartan  12.5 mg Oral Daily  . metoprolol succinate  12.5 mg Oral Daily  . multivitamin with minerals  1 tablet Oral Daily  . traZODone  200 mg Oral QHS   Continuous Infusions: . heparin 950 Units/hr (12/13/20 0809)   PRN Meds: albuterol, ondansetron (ZOFRAN) IV, traMADol   Vital Signs    Vitals:   12/13/20 0241 12/13/20 0443 12/13/20 0510 12/13/20 0815  BP: (!) 71/48 (!) 96/58 115/62   Pulse: 70 66 68   Resp: 18 18 18    Temp: 98.6 F (37 C) 98.6 F (37 C) 97.7 F (36.5 C)   TempSrc: Oral  Oral   SpO2: 95% 97% 97% 97%  Weight:   47.7 kg   Height:        Intake/Output Summary (Last 24 hours) at 12/13/2020 0857 Last data filed at 12/13/2020 0651 Gross per 24 hour  Intake 1440 ml  Output 200 ml  Net 1240 ml   Last 3 Weights 12/13/2020 12/12/2020 12/11/2020  Weight (lbs) 105 lb 2.6 oz 105 lb 9.6 oz 104 lb 4.4 oz  Weight (kg) 47.7 kg 47.9 kg 47.3 kg      Telemetry    SR - Personally Reviewed  ECG    n/a - Personally Reviewed  Physical Exam   GEN: No acute distress.   Neck: No JVD Cardiac: RRR, no murmurs, rubs, or gallops.  Respiratory: Clear to auscultation bilaterally. GI: Soft, nontender, non-distended  MS: No edema; No deformity. Neuro:  Nonfocal  Psych: Normal affect   Labs    High Sensitivity Troponin:   Recent Labs  Lab 12/07/20 1132 12/07/20 1604 12/10/20 0442  TROPONINIHS  333* 338* 188*      Chemistry Recent Labs  Lab 12/07/20 0953 12/08/20 0705 12/10/20 0442 12/11/20 0655 12/12/20 0634 12/13/20 0608  NA 144 143   < > 142 140 139  K 4.0 4.0   < > 3.4* 3.1* 3.7  CL 106 106   < > 104 102 104  CO2 25 25   < > 28 29 28   GLUCOSE 118* 110*   < > 100* 96 100*  BUN 44* 31*   < > 14 16 17   CREATININE 1.22* 1.06*   < > 1.09* 1.22* 1.25*  CALCIUM 9.2 8.6*   < > 7.9* 8.4* 8.3*  PROT 8.0 7.3  --   --   --   --   ALBUMIN 3.5 3.2*  --   --   --   --   AST 66* 43*  --   --   --   --   ALT 95* 74*  --   --   --   --   ALKPHOS 72 60  --   --   --   --   BILITOT 0.7 0.7  --   --   --   --  GFRNONAA 48* 57*   < > 55* 48* 47*  ANIONGAP 13 12   < > 10 9 7    < > = values in this interval not displayed.     Hematology Recent Labs  Lab 12/11/20 0655 12/12/20 0634 12/13/20 0608  WBC 10.1 8.2 9.7  RBC 4.68 4.88 4.33  HGB 14.0 14.8 13.2  HCT 45.1 47.4* 42.3  MCV 96.4 97.1 97.7  MCH 29.9 30.3 30.5  MCHC 31.0 31.2 31.2  RDW 13.9 14.1 14.0  PLT 261 250 246    BNP Recent Labs  Lab 12/10/20 0442 12/12/20 0634  BNP 2,426.0* 500.0*     DDimer  Recent Labs  Lab 12/07/20 0953  DDIMER 5.00*     Radiology    No results found.  Cardiac Studies    Patient Profile     Mindy Arreaga Johnsonis a 69 y.o.femalewith a hx of prior systolic dysfunction that noramlized, SAH,who is being seen today for the evaluation of SOBat the request of Dr Roderic Palau.  Assessment & Plan     1. Acute systolic HF - history of systolic dysfunction in 8413 Takotsubo CM pattern in setting of SAH and seizure, LVEF later normalized - presents with pneumonia,LVEF found to be decreased again to 25-30% in a pattern also potentially suggestive of possible recurrent Takotsubo CM  -exam fairly benign though markedly elevated BNP.Started on IV lasix 40mg  daily -I/Os are incomplete, weights overall appear inaccurate, standing weight yesterday 47.9kg - BNP down significantly from  2400 to 500 this AM. Mild uptrend in Cr.  - will d/c IV lasix, start oral 40mg  daily tomorrow   - started toprol 12.5mg  daily, losartan 12.5mg  daily. Low bp's, we will d/c losartan.    - possible LHC/RHC this admission once pneumonia and volume status improved, potentially early next week  2. Elevated troponin - flat trop around 300now trending down, EKG with precordial TWIs. Denies any chest pains - could be demand ischemia related to pneumonia. Can also see elevated trops in setting of stress induced CM. Cannot exlcude ischemia. - possible cath this admit, given prior history of SAH she has been cleared by neuro if needs DAPT   3. Pneumonia - possible aspiration pneumonia, management per primrary team    4. Apical thrombus - noted on echo this admission in setting of apical hypokinesis -history of prior SAH, has been cleared by neuro for anticoag. Started hep gtt - Convert to coumadin later during admission as she may require invasive procedures.   5. 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

## 2020-12-13 NOTE — Progress Notes (Signed)
Follow up ct head reviewed  IMPRESSION: 1. Loss of gray-white differentiation affecting the left frontal operculum consistent with the region of acute infarction shown by MRI. Overall, this measures approximately 5-6 cm in size. No gross hemorrhagic transformation. Possible early petechial blood hyperdensity in the anterior portion of the infarction. No mass effect or shift. 2. 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 3. Chronic small-vessel ischemic changes of the white matter. Old right frontal cortical and subcortical infarction.     Continue heparin and repeat CTH at 7am..  -- Amie Portland, MD [facility] Pager: [phone] If 7pm to 7am, please call on call as listed on AMION.

## 2020-12-13 NOTE — Progress Notes (Signed)
Patient is altered, will not speak and has some facial asymmetry to the right side. Patient will not grip firmly with right hand, does not react to painful stimuli on right hand or foot/leg. Patient appears to be dazed and looks around. Vitals were checked along with a CBG, I spoke with charge RN, amion the physician and called in for a code stroke at 1056 12/13/2020. Patient was taken down to CT, Teleneuro is currently being consulted. Patient is back in her room.

## 2020-12-13 NOTE — Progress Notes (Signed)
Mindy A. Merlene Laughter, MD     [email or web address]          Mindy Mills is an 69 y.o. female.   ASSESSMENT/PLAN: 1.  Acute ischemic stroke involving the left frontal temporal hemispheres due to acute occlusion of the left MCA M2 segment.  There also appears to be a small infarct involving the right cerebellum.  Taken together in the setting of cardiac thrombus, this is most likely cardioembolic.  The patient currently is on anticoagulation.  The patient is currently scheduled to be transferred to [facility] for higher level of monitoring.   I was asked to return to see the patient today.  The patient developed acute aphasia and right hemiparesis earlier this morning.  She was evaluated for possible thrombolysis but given that she is on heparin she was not a candidate.  She also was not a candidate for thrombectomy given the lack of mismatch on perfusion imaging.  The patient sister is at the bedside and the case discussed extensively with her.   GENERAL: She is awake and cooperates with evaluation but clearly has significant language impairment.  HEENT: Neck is supple no trauma noted.  ABDOMEN: soft  EXTREMITIES: No edema   BACK: Normal  SKIN: Normal by inspection.    MENTAL STATUS: She is essentially mute.  She does follow occasional simple commands.  CRANIAL NERVES: Pupils are equal, round and reactive to light and accomodation; extra ocular movements are full, there is no significant nystagmus; visual fields are full; upper and lower facial muscles are normal in strength and symmetric, there is no flattening of the nasolabial folds; tongue is midline; Visual fields are intact.  MOTOR: There is mild weakness throughout mostly due to difficulty following commands.  She is about 4+ on the left and 4 - on the right.  I do not appreciate a drift in any of the extremities.  COORDINATION: There is no dysmetria noted.  No tremors noted.  REFLEXES: Deep tendon  reflexes are symmetrical and normal. Plantar reflexes are flexor bilaterally.   SENSATION: Normal to pain.   NIH stroke scale 1, 2, 1, 3 equal 7.    Blood pressure (!) 94/45, pulse 69, temperature 98.5 F (36.9 C), resp. rate 14, height 5\' 3"  (1.6 m), weight 47.7 kg, SpO2 100 %.  Past Medical History:  Diagnosis Date  . 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 Full dentures   . Hepatitis C    denies  . Hypertension   . Migraine   . Myocardial infarction ([place])   . Stroke [facility]) 2015   Pt reports hx of CVA x3.  Last one in 2015  . Wears glasses   . Wears hearing aid in both ears     Past Surgical History:  Procedure Laterality Date  . ABDOMINAL HYSTERECTOMY    . BUNIONECTOMY    . COLONOSCOPY    . MULTIPLE TOOTH EXTRACTIONS    . OPEN REDUCTION INTERNAL FIXATION (ORIF) DISTAL RADIAL FRACTURE Right 09/07/2018   Procedure: OPEN REDUCTION INTERNAL FIXATION (ORIF) DISTAL RADIAL FRACTURE;  Surgeon: Iran Planas, MD;  Location: [place];  Service: Orthopedics;  Laterality: Right;  . PEG PLACEMENT N/A 09/21/2014   Procedure: PERCUTANEOUS ENDOSCOPIC GASTROSTOMY (PEG) PLACEMENT (BED SIDE);  Surgeon: Georganna Skeans, MD;  Location: [facility] ENDOSCOPY;  Service: General;  Laterality: N/A;  . RADIOLOGY WITH ANESTHESIA N/A 09/11/2014   Procedure: Sub Arachnoid Aneurysm Coiling -RADIOLOGY WITH ANESTHESIA ;  Surgeon: Consuella Lose, MD;  Location: [place];  Service: Radiology;  Laterality: N/A;    Family History  Problem Relation Age of Onset  . Alcohol abuse Father     Social History:  reports that she quit smoking about 5 years ago. Her smoking use included cigarettes. She smoked 0.50 packs per day. She has never used smokeless tobacco. She reports previous alcohol use. She reports that she does not use drugs.  Allergies:  Allergies  Allergen Reactions  . Bee Venom Anaphylaxis and Hives  . Capsicum (Cayenne) [Cayenne] Swelling    Medications: Prior to Admission medications   Medication Sig Start Date  End Date Taking? Authorizing Provider  aspirin 325 MG EC tablet Take 325 mg by mouth daily.   Yes [provider]  cholecalciferol (VITAMIN D) 1000 units tablet Take 1,000 Units by mouth daily.   Yes [provider]  FLUoxetine (PROZAC) 20 MG capsule Take 80 mg by mouth daily.   Yes [provider]  traZODone (DESYREL) 100 MG tablet Take 200 mg by mouth at bedtime.   Yes [provider]  clonazePAM (KLONOPIN) 0.5 MG tablet Take 1 tablet (0.5 mg total) by mouth 2 (two) times daily. Patient not taking: No sig reported 09/11/15   Jonetta Osgood, MD  docusate sodium (COLACE) 100 MG capsule Take 1 capsule (100 mg total) by mouth 2 (two) times daily. Patient not taking: No sig reported 09/09/18   Gertie Fey Bonham, PA  EPINEPHrine 0.3 mg/0.3 mL IJ SOAJ injection Inject 0.3 mg into the muscle once as needed (for allergic reaction).    [provider]  feeding supplement, ENSURE ENLIVE, (ENSURE ENLIVE) LIQD Take 237 mLs by mouth daily. Patient not taking: Reported on 12/07/2020 09/11/15   Jonetta Osgood, MD  methocarbamol (ROBAXIN) 500 MG tablet Take 1 tablet (500 mg total) by mouth every 6 (six) hours as needed for muscle spasms. Patient not taking: No sig reported 09/09/18   Brynda Peon, PA  oxyCODONE-acetaminophen (PERCOCET) 5-325 MG tablet Take 1-2 tablets by mouth every 6 (six) hours as needed. Patient not taking: No sig reported 08/31/18   Charlesetta Shanks, MD  pantoprazole (PROTONIX) 40 MG tablet Take 1 tablet (40 mg total) by mouth at bedtime. Patient not taking: No sig reported 09/11/15   Jonetta Osgood, MD  rosuvastatin (CRESTOR) 40 MG tablet Take 20 mg by mouth daily. Patient not taking: No sig reported    [provider]  traMADol (ULTRAM) 50 MG tablet Take 100 mg by mouth 4 (four) times daily as needed for moderate pain. Patient not taking: No sig reported    [provider]    Scheduled Meds: .  arformoterol  15 mcg Nebulization BID  . [START ON 12/14/2020] aspirin  300 mg Rectal Daily  . budesonide (PULMICORT) nebulizer solution  0.5 mg Nebulization BID  . feeding supplement  237 mL Oral BID BM  . FLUoxetine  80 mg Oral Daily  . [START ON 12/14/2020] furosemide  40 mg Oral Daily  . guaiFENesin  600 mg Oral BID  . ipratropium-albuterol  3 mL Nebulization BID  . metoprolol succinate  12.5 mg Oral Daily  . multivitamin with minerals  1 tablet Oral Daily  . traZODone  200 mg Oral QHS   Continuous Infusions: . ampicillin-sulbactam (UNASYN) IV    . heparin 800 Units/hr (12/13/20 1320)   PRN Meds:.albuterol, ondansetron (ZOFRAN) IV, traMADol     Results for orders placed or performed during the hospital encounter of 12/07/20 (from the past 48 hour(s))  Heparin level (unfractionated)     Status: Abnormal   Collection Time: 12/11/20  4:07 PM  Result Value Ref Range   Heparin Unfractionated 0.22 (L) 0.30 - 0.70 IU/mL    Comment: (NOTE) If heparin results are below expected values, and patient dosage has  been confirmed, suggest follow up testing of antithrombin III levels. Performed at [facility], [address], [place], [place] [address]   Heparin level (unfractionated)     Status: None   Collection Time: 12/11/20 11:25 PM  Result Value Ref Range   Heparin Unfractionated 0.58 0.30 - 0.70 IU/mL    Comment: (NOTE) If heparin results are below expected values, and patient dosage has  been confirmed, suggest follow up testing of antithrombin III levels. Performed at [facility], [address], [place], [place] [address]   CBC     Status: Abnormal   Collection Time: 12/12/20  6:34 AM  Result Value Ref Range   WBC 8.2 4.0 - 10.5 K/uL   RBC 4.88 3.87 - 5.11 MIL/uL   Hemoglobin 14.8 12.0 - 15.0 g/dL   HCT 47.4 (H) 36.0 - 46.0 %   MCV 97.1 80.0 - 100.0 fL   MCH 30.3 26.0 - 34.0 pg   MCHC 31.2 30.0 - 36.0 g/dL   RDW 14.1 11.5 - 15.5 %   Platelets 250 150 - 400 K/uL    nRBC 0.0 0.0 - 0.2 %    Comment: Performed at [facility], [address] [place], [place] [address]  Heparin level (unfractionated)     Status: None   Collection Time: 12/12/20  6:34 AM  Result Value Ref Range   Heparin Unfractionated 0.67 0.30 - 0.70 IU/mL    Comment: (NOTE) If heparin results are below expected values, and patient dosage has  been confirmed, suggest follow up testing of antithrombin III levels. Performed at [facility], [address] [place], [place] [address]   Brain natriuretic peptide     Status: Abnormal   Collection Time: 12/12/20  6:34 AM  Result Value Ref Range   B Natriuretic Peptide 500.0 (H) 0.0 - 100.0 pg/mL    Comment: Performed at [facility], [address], [place], [place] [address]  Basic metabolic panel     Status: Abnormal   Collection Time: 12/12/20  6:34 AM  Result Value Ref Range   Sodium 140 135 - 145 mmol/L   Potassium 3.1 (L) 3.5 - 5.1 mmol/L   Chloride 102 98 - 111 mmol/L   CO2 29 22 - 32 mmol/L   Glucose, Bld 96 70 - 99 mg/dL    Comment: Glucose reference range applies only to samples taken after fasting for at least 8 hours.   BUN 16 8 - 23 mg/dL   Creatinine, Ser 1.22 (H) 0.44 - 1.00 mg/dL   Calcium 8.4 (L) 8.9 - 10.3 mg/dL   GFR, Estimated 48 (L) >60 mL/min    Comment: (NOTE) Calculated using the CKD-EPI Creatinine Equation (2021)    Anion gap 9 5 - 15    Comment: Performed at [facility], [address] [place], [place] [address]  Procalcitonin - Baseline     Status: None   Collection Time: 12/12/20  6:34 AM  Result Value Ref Range   Procalcitonin <0.10 ng/mL    Comment:        Interpretation: PCT (Procalcitonin) <= 0.5 ng/mL: Systemic infection (sepsis) is not likely. Local bacterial infection is possible. (NOTE)       Sepsis PCT Algorithm  Lower Respiratory Tract                                      Infection PCT Algorithm    ----------------------------     ----------------------------         PCT < 0.25  ng/mL                PCT < 0.10 ng/mL          Strongly encourage             Strongly discourage   discontinuation of antibiotics    initiation of antibiotics    ----------------------------     -----------------------------       PCT 0.25 - 0.50 ng/mL            PCT 0.10 - 0.25 ng/mL               OR       >80% decrease in PCT            Discourage initiation of                                            antibiotics      Encourage discontinuation           of antibiotics    ----------------------------     -----------------------------         PCT >= 0.50 ng/mL              PCT 0.26 - 0.50 ng/mL               AND        <80% decrease in PCT             Encourage initiation of                                             antibiotics       Encourage continuation           of antibiotics    ----------------------------     -----------------------------        PCT >= 0.50 ng/mL                  PCT > 0.50 ng/mL               AND         increase in PCT                  Strongly encourage                                      initiation of antibiotics    Strongly encourage escalation           of antibiotics                                     -----------------------------  PCT <= 0.25 ng/mL                                                 OR                                        > 80% decrease in PCT                                      Discontinue / Do not initiate                                             antibiotics  Performed at [facility], [address] [place], [place] [address]   CBC     Status: None   Collection Time: 12/13/20  6:08 AM  Result Value Ref Range   WBC 9.7 4.0 - 10.5 K/uL   RBC 4.33 3.87 - 5.11 MIL/uL   Hemoglobin 13.2 12.0 - 15.0 g/dL   HCT 42.3 36.0 - 46.0 %   MCV 97.7 80.0 - 100.0 fL   MCH 30.5 26.0 - 34.0 pg   MCHC 31.2 30.0 - 36.0 g/dL   RDW 14.0 11.5 - 15.5 %   Platelets 246 150 - 400 K/uL   nRBC 0.0  0.0 - 0.2 %    Comment: Performed at [facility], [address] [place], [place] [address]  Heparin level (unfractionated)     Status: Abnormal   Collection Time: 12/13/20  6:08 AM  Result Value Ref Range   Heparin Unfractionated 0.85 (H) 0.30 - 0.70 IU/mL    Comment: (NOTE) If heparin results are below expected values, and patient dosage has  been confirmed, suggest follow up testing of antithrombin III levels. Performed at [facility], [address], [place], [place] [address]   Basic metabolic panel     Status: Abnormal   Collection Time: 12/13/20  6:08 AM  Result Value Ref Range   Sodium 139 135 - 145 mmol/L   Potassium 3.7 3.5 - 5.1 mmol/L   Chloride 104 98 - 111 mmol/L   CO2 28 22 - 32 mmol/L   Glucose, Bld 100 (H) 70 - 99 mg/dL    Comment: Glucose reference range applies only to samples taken after fasting for at least 8 hours.   BUN 17 8 - 23 mg/dL   Creatinine, Ser 1.25 (H) 0.44 - 1.00 mg/dL   Calcium 8.3 (L) 8.9 - 10.3 mg/dL   GFR, Estimated 47 (L) >60 mL/min    Comment: (NOTE) Calculated using the CKD-EPI Creatinine Equation (2021)    Anion gap 7 5 - 15    Comment: Performed at [facility], [address], [place], [place] [address]  Magnesium     Status: None   Collection Time: 12/13/20  6:08 AM  Result Value Ref Range   Magnesium 2.0 1.7 - 2.4 mg/dL    Comment: Performed at [facility], [address], [place], [place] [address]  Glucose, capillary     Status: None   Collection Time: 12/13/20 11:04  AM  Result Value Ref Range   Glucose-Capillary 94 70 - 99 mg/dL    Comment: Glucose reference range applies only to samples taken after fasting for at least 8 hours.  Comprehensive metabolic panel     Status: Abnormal   Collection Time: 12/13/20 11:15 AM  Result Value Ref Range   Sodium 139 135 - 145 mmol/L   Potassium 4.0 3.5 - 5.1 mmol/L   Chloride 105 98 - 111 mmol/L   CO2 26 22 - 32 mmol/L   Glucose, Bld 111 (H) 70 - 99 mg/dL    Comment: Glucose reference  range applies only to samples taken after fasting for at least 8 hours.   BUN 15 8 - 23 mg/dL   Creatinine, Ser 1.17 (H) 0.44 - 1.00 mg/dL   Calcium 8.2 (L) 8.9 - 10.3 mg/dL   Total Protein 6.0 (L) 6.5 - 8.1 g/dL   Albumin 2.6 (L) 3.5 - 5.0 g/dL   AST 25 15 - 41 U/L   ALT 23 0 - 44 U/L   Alkaline Phosphatase 47 38 - 126 U/L   Total Bilirubin 0.6 0.3 - 1.2 mg/dL   GFR, Estimated 51 (L) >60 mL/min    Comment: (NOTE) Calculated using the CKD-EPI Creatinine Equation (2021)    Anion gap 8 5 - 15    Comment: Performed at [facility], [address], [place], [place] [address]  CBC     Status: None   Collection Time: 12/13/20 11:15 AM  Result Value Ref Range   WBC 9.2 4.0 - 10.5 K/uL   RBC 4.17 3.87 - 5.11 MIL/uL   Hemoglobin 12.8 12.0 - 15.0 g/dL   HCT 40.5 36.0 - 46.0 %   MCV 97.1 80.0 - 100.0 fL   MCH 30.7 26.0 - 34.0 pg   MCHC 31.6 30.0 - 36.0 g/dL   RDW 14.0 11.5 - 15.5 %   Platelets 256 150 - 400 K/uL   nRBC 0.0 0.0 - 0.2 %    Comment: Performed at [facility], [address], [place], [place] [address]  Protime-INR     Status: None   Collection Time: 12/13/20 11:15 AM  Result Value Ref Range   Prothrombin Time 13.2 11.4 - 15.2 seconds   INR 1.0 0.8 - 1.2    Comment: (NOTE) INR goal varies based on device and disease states. Performed at [facility], [address], [place], [place] [address]   APTT     Status: Abnormal   Collection Time: 12/13/20 11:15 AM  Result Value Ref Range   aPTT 89 (H) 24 - 36 seconds    Comment:        IF BASELINE aPTT IS ELEVATED, SUGGEST PATIENT RISK ASSESSMENT BE USED TO DETERMINE APPROPRIATE ANTICOAGULANT THERAPY. Performed at [facility], [address], [place], [place] [address]     Studies/Results:  BRAIN MRI  FINDINGS: DWI, sagittal T1, and axial T2 sequences were obtained. Motion artifact is present.  Brain: There is restricted diffusion in the left MCA territory involving the frontal lobe and anterior insula. There is  involvement of the lateral precentral gyrus as well as the operculum. Additional small area of acute infarction along the body of the caudate on the left. There is a third small area of acute infarction in the right cerebellum.  Small right parietal chronic infarct. Small chronic infarct of the left corona radiata. Additional areas of T2 hyperintensity in the supratentorial white matter probably reflect chronic microvascular ischemic changes.  There  is no intracranial mass or mass effect.  No hydrocephalus.  Vascular: Major vessel flow voids at the skull base are preserved.  Skull and upper cervical spine: Normal marrow signal is preserved.  Sinuses/Orbits: Paranasal sinuses are aerated. Orbits are unremarkable.  Other: Sella is unremarkable. Mild patchy mastoid fluid opacification.  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 Discontiguous small acute infarct of the left caudate. Third small acute infarct of the right cerebellum.     HEAD NECK CTA IMPRESSION: 1. Left M2 superior division occlusion. 2. Associated left MCA territory infarct without evidence of a significant penumbra on CTP. 3. Patent carotid and vertebral arteries. Loop in the proximal left ICA with focal kinking versus a small web. 4. Previous anterior communicating aneurysm coiling       Alecsander Hattabaugh A. Merlene Mills, M.D.  Diplomate, Tax adviser of Psychiatry and Neurology ( Neurology). 12/13/2020, 2:55 PM

## 2020-12-13 NOTE — Progress Notes (Signed)
°   12/13/20 1054  Assess: MEWS Score  Temp 99 F (37.2 C)  BP (!) 84/33  Pulse Rate 72  Resp 12  SpO2 95 %  O2 Device Nasal Cannula  Assess: MEWS Score  MEWS Temp 0  MEWS Systolic 1  MEWS Pulse 0  MEWS RR 1  MEWS LOC 0  MEWS Score 2  MEWS Score Color Yellow  Assess: if the MEWS score is Yellow or Red  Were vital signs taken at a resting state? Yes  Focused Assessment Change from prior assessment (see assessment flowsheet)  Early Detection of Sepsis Score *See Row Information* Low  MEWS guidelines implemented *See Row Information* Yes  Treat  MEWS Interventions Escalated (See documentation below)  Take Vital Signs  Increase Vital Sign Frequency  Yellow: Q 2hr X 2 then Q 4hr X 2, if remains yellow, continue Q 4hrs  Escalate  MEWS: Escalate Yellow: discuss with charge nurse/RN and consider discussing with provider and RRT  Notify: Charge Nurse/RN  Name of Charge Nurse/RN Notified patricia bengtson, RN  Date Charge Nurse/RN Notified 12/13/20  Time Charge Nurse/RN Notified 1054  Notify: Provider  Provider Name/Title dr tat  Date Provider Notified 12/13/20  Time Provider Notified 1054  Notification Type Page  Notification Reason Change in status  Response See new orders (dr Verlon Au responded and then dr tat arrived)  Date of Provider Response 12/13/20  Time of Provider Response 1102  Notify: Rapid Response  Name of Rapid Response RN Notified tim goins, Rn  Date Rapid Response Notified 12/13/20  Time Rapid Response Notified 1054  Document  Patient Outcome Other (Comment) (code stroke, CT head completed, monitor until tele neuro and CT results per dr tat)  Progress note created (see row info) Yes

## 2020-12-13 NOTE — Progress Notes (Addendum)
Case discussed with neurology, Dr. Rory Percy  CT brain is suspicious for an acute left MCA infarct involving the insula and possibly the operculum with an aspects of 8-9.  CTA H&N and perfusion did not reveal lesion amendable to intervention.  Dr. Rory Percy recommended transfer to [facility] for neurology to follow and to continue heparin per stroke protocol. I have also contacted cardiology to update them and requested them to follow.  I spoke with patient's estranged sister to update her on patient's clinical situation and transfer to [facility].  Sister asked me to contact patient's daughter-in-law (Ms. Ulysees Barns).  I spoke with Ms. Ulysees Barns who is willing to act as the patient's main advocate.  We discussed advanced directives and goals of care after I updated Ms. Otsego on the clinical situation.  Patient now changed to DNR.  Daughter in law agrees with transfer to [facility].  -order speech therapy eval;  Changed amox/clav back to unasyn until safe to take po.  Change ASA to suppository  DTat

## 2020-12-13 NOTE — Progress Notes (Signed)
   12/13/20 0241  Assess: MEWS Score  Temp 98.6 F (37 C)  BP (!) 71/48  Pulse Rate 70  Resp 18  SpO2 95 %  O2 Device Nasal Cannula  O2 Flow Rate (L/min) 3 L/min  Assess: MEWS Score  MEWS Temp 0  MEWS Systolic 2  MEWS Pulse 0  MEWS RR 0  MEWS LOC 0  MEWS Score 2  MEWS Score Color Yellow  Assess: if the MEWS score is Yellow or Red  Were vital signs taken at a resting state? Yes  Focused Assessment No change from prior assessment  Early Detection of Sepsis Score *See Row Information* Low  MEWS guidelines implemented *See Row Information* No, previously yellow, continue vital signs every 4 hours

## 2020-12-13 NOTE — Progress Notes (Signed)
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 Patient has since had PEG tube and tracheostomy removed. She reports that for the past 3 days, she has progressive shortness of breath and cough. She denies any fever. She denies any vomiting. She has not felt as though she is been choking after eating or drinking. She has not had any chest pain. She feels increasingly weak. No dysuria, diarrhea. Since she had progressive shortness of breath, she called EMS for further evaluation  ED Course:Upon arrival to the emergency room, patient is noted to have a mildly elevated creatinine. Oxygen has been applied since she was noted to be hypoxic. Oxygen saturations were 88% on room air. D-dimer was noted to be elevated and CT of the chest was performed that indicated evidence of aspiration pneumonia. COVID-19 test was found to be negative. Troponin mildly elevated around 300 and EKG showed T wave inversions in the anterolateral leads. She was referred for admission.  AM 12/13/20--patient was noted to be dysphasic, not responding to questions nor following commands by RN.  Code Stroke activated at 1100.  Patient was noted to be speaking when she was evaluated by PT at 1012AM HR--70--RR16--84/33--99% on 2.5 to 3L  Assessment/Plan: Altered mental status/Dysphasia -concerned about stroke -STAT CT brain -check CMP, INR, PTT, ammonia -teleneurology consult -EKG -further work up pending results, but likely needs MRI -EEG -not likely a tPA candidate as she has been on IV heparin since 12/14  Acute respiratory failure with hypoxia -Secondary to pneumoniaand CHF in setting of COPD -currently stable on 4L>>2.5 to 3L -Wean off oxygen as  tolerated -wean oxygen for saturation 88-92%  Aspiration pneumonia -12/1 CTA chest--no PE; extensive fluid and debris RLL bronchus -Started on Unasyn12/12>>12.14 -Continue pulmonary hygiene with bronchodilators and mucolytic's -seen by speech therapy with recommendations for D3 diet -Change Unasyn to Augmentin 08/14  Acute systolic CHF -48/18/56DJ 49-70%, G2DD, +apical thrombus, trivial MR -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 Can also see elevated trops in setting of stress induced CM. Cannot exlcude ischemia. 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 -Patient had aneurysm coiled at that time -Seen by neurology and felt reasonable to start on dual antiplatelet therapy versus anticoagulation as needed  CKDIIIa -Creatinine is currently at baseline -baseline 1.0-1.2 -monitor with diuresis  Generalized weakness -seen by physical therapy with recommendations for SNF--pt refuses  Hypokalemia -replete -check mag--2.0  Severe Malnutrition -continue nutrition supplements      Status is: Inpatient  Remains inpatient appropriate because:IV treatments appropriate due to intensity of illness or inability to take PO   Dispo: The patient is  from:Home Anticipated d/c is YO:VZCH  Anticipated d/c date is: 2 days Patient currently is not medically stable to d/c.        Family Communication:NoFamily at bedside  Consultants:Cardiology  Code Status: FULL   DVT Prophylaxis:IVHeparin   Procedures: As Listed in Progress Note Above  Antibiotics: Unasyn 12/12>>12/14 Amox/clav 12/14>>     Subjective: Patient not following commands or   Objective: Vitals:   12/13/20 0510 12/13/20 0815 12/13/20 0907 12/13/20 1054  BP: 115/62  (!) 91/41 (!) 84/33  Pulse: 68  71 72  Resp: 18  16 12   Temp: 97.7 F (36.5 C)  99 F (37.2 C) 99 F (37.2 C)  TempSrc: Oral  Oral   SpO2: 97% 97% 96% 95%  Weight: 47.7 kg     Height:        Intake/Output Summary (Last 24 hours) at 12/13/2020 1110 Last data filed at 12/13/2020 0900 Gross per 24 hour  Intake 1440 ml  Output 200 ml  Net 1240 ml   Weight change: -0.2 kg Exam:   General:  Pt is alert, follows commands appropriately, not in acute distress  HEENT: No icterus, No thrush, No neck mass, Pump Back/AT  Cardiovascular: RRR, S1/S2, no rubs, no gallops  Respiratory: bibasilar crackles; no wheeze  Abdomen: Soft/+BS, non tender, non distended, no guarding  Extremities: No edema, No lymphangitis, No petechiae, No rashes, no synovitis  Neuro: left gaze preference.  Right facial droop, strength 4-/5 in RUE, 3-RLE, strength 4-/5 LUE, LLE; sensation intact bilateral; no dysmetria; babinski equivocal     Data Reviewed: I have personally reviewed following labs and imaging studies Basic Metabolic Panel: Recent Labs  Lab 12/08/20 0705 12/10/20 0442 12/11/20 0655 12/12/20 0634 12/13/20 0608  NA 143 143 142 140 139  K 4.0 3.7 3.4* 3.1* 3.7  CL 106 108 104 102 104  CO2 25 29 28 29 28   GLUCOSE 110* 87 100* 96 100*  BUN 31* 16 14 16 17   CREATININE 1.06* 1.04* 1.09* 1.22* 1.25*  CALCIUM 8.6* 7.9* 7.9* 8.4*  8.3*  MG  --   --   --   --  2.0   Liver Function Tests: Recent Labs  Lab 12/07/20 0953 12/08/20 0705  AST 66* 43*  ALT 95* 74*  ALKPHOS 72 60  BILITOT 0.7 0.7  PROT 8.0 7.3  ALBUMIN 3.5 3.2*   No results for input(s): LIPASE, AMYLASE in the last 168 hours. No results for input(s): AMMONIA in the last 168 hours. Coagulation Profile: No results for input(s): INR, PROTIME in the last 168 hours. CBC: Recent Labs  Lab 12/07/20 0953 12/08/20 0705 12/10/20 0442 12/11/20 0655 12/12/20 0634 12/13/20 0608  WBC 14.9* 10.1 9.5 10.1 8.2 9.7  NEUTROABS 12.1*  --   --   --   --   --   HGB 16.8* 15.2* 13.6 14.0 14.8 13.2  HCT 53.1* 48.2* 44.0 45.1 47.4* 42.3  MCV 96.0 97.0 97.1 96.4 97.1 97.7  PLT 319 272 246 261 250 246   Cardiac Enzymes: Recent Labs  Lab 12/07/20 1604  CKTOTAL 348*   BNP: Invalid input(s): POCBNP CBG: Recent Labs  Lab 12/08/20 1557 12/13/20 1104  GLUCAP 104* 94   HbA1C: No results for input(s): HGBA1C in the last 72 hours. Urine analysis:    Component Value Date/Time   COLORURINE YELLOW 09/08/2015 1817   APPEARANCEUR CLEAR 09/08/2015 1817   LABSPEC 1.038 (H) 09/08/2015 1817   PHURINE 6.5 09/08/2015 1817   GLUCOSEU NEGATIVE 09/08/2015 1817   HGBUR LARGE (A) 09/08/2015  [place] 09/08/2015 [place] 09/08/2015 1817   PROTEINUR 30 (A) 09/08/2015 1817   UROBILINOGEN 1.0 09/08/2015 1817   NITRITE NEGATIVE 09/08/2015 1817   LEUKOCYTESUR SMALL (A) 09/08/2015 1817   Sepsis Labs: @LABRCNTIP (procalcitonin:4,lacticidven:4) ) Recent Results (from the past 240 hour(s))  Resp Panel by RT-PCR (Flu A&B, Covid) Nasopharyngeal Swab     Status: None   Collection Time: 12/07/20  9:37 AM   Specimen: Nasopharyngeal Swab; Nasopharyngeal(NP) swabs in vial transport medium  Result Value Ref Range Status   SARS Coronavirus 2 by RT PCR NEGATIVE NEGATIVE Final    Comment: (NOTE) SARS-CoV-2 target nucleic acids are NOT  DETECTED.  The SARS-CoV-2 RNA is generally detectable in upper respiratory specimens during the acute phase of infection. The lowest concentration of SARS-CoV-2 viral copies this assay can detect is 138 copies/mL. A negative result does not preclude SARS-Cov-2 infection and should not be used as the sole basis for treatment or other patient management decisions. A negative result may occur with  improper specimen collection/handling, submission of specimen other than nasopharyngeal swab, presence of viral mutation(s) within the areas targeted by this assay, and inadequate number of viral copies(<138 copies/mL). A negative result must be combined with clinical observations, patient history, and epidemiological information. The expected result is Negative.  Fact Sheet for Patients:  EntrepreneurPulse.com.au  Fact Sheet for Healthcare Providers:  IncredibleEmployment.be  This test is no t yet approved or cleared by the [place] FDA and  has been authorized for detection and/or diagnosis of SARS-CoV-2 by FDA under an Emergency Use Authorization (EUA). This EUA will remain  in effect (meaning this test can be used) for the duration of the COVID-19 declaration under Section 564(b)(1) of the Act, 21 U.S.C.section 360bbb-3(b)(1), unless the authorization is terminated  or revoked sooner.       Influenza A by PCR NEGATIVE NEGATIVE Final   Influenza B by PCR NEGATIVE NEGATIVE Final    Comment: (NOTE) The Xpert Xpress SARS-CoV-2/FLU/RSV plus assay is intended as an aid in the diagnosis of influenza from Nasopharyngeal swab specimens and should not be used as a sole basis for treatment. Nasal washings and aspirates are unacceptable for Xpert Xpress SARS-CoV-2/FLU/RSV testing.  Fact Sheet for Patients: EntrepreneurPulse.com.au  Fact Sheet for Healthcare Providers: IncredibleEmployment.be  This test is not yet  approved or cleared by the [place] FDA and has been authorized for detection and/or diagnosis of SARS-CoV-2 by FDA under an Emergency Use Authorization (EUA). This EUA will remain in effect (meaning this test can be used) for the duration of the COVID-19 declaration under Section 564(b)(1) of the Act, 21 U.S.C. section 360bbb-3(b)(1), unless the authorization is terminated or revoked.  Performed at [facility], [address], [place], [place] [address]   Blood Culture (routine x 2)     Status: None   Collection Time: 12/07/20  9:53 AM   Specimen: Right Antecubital; Blood  Result Value Ref Range Status   Specimen Description   Final    RIGHT ANTECUBITAL BOTTLES DRAWN AEROBIC AND ANAEROBIC   Special Requests Blood Culture adequate volume  Final   Culture   Final    NO GROWTH 5 DAYS Performed at [facility], [address] [place], [place] [address]    Report Status 12/12/2020 FINAL  Final  Blood Culture (routine x 2)     Status: None   Collection Time: 12/07/20 11:22 AM   Specimen: Right Antecubital; Blood  Result Value Ref Range Status  Specimen Description   Final    RIGHT ANTECUBITAL BOTTLES DRAWN AEROBIC AND ANAEROBIC   Special Requests Blood Culture adequate volume  Final   Culture   Final    NO GROWTH 5 DAYS Performed at [facility], [address] [place], [place] [address]    Report Status 12/12/2020 FINAL  Final     Scheduled Meds: . amoxicillin-clavulanate  1 tablet Oral Q12H  . arformoterol  15 mcg Nebulization BID  . aspirin  325 mg Oral Daily  . budesonide (PULMICORT) nebulizer solution  0.5 mg Nebulization BID  . feeding supplement  237 mL Oral BID BM  . FLUoxetine  80 mg Oral Daily  . [START ON 12/14/2020] furosemide  40 mg Oral Daily  . guaiFENesin  600 mg Oral BID  . ipratropium-albuterol  3 mL Nebulization BID  . metoprolol succinate  12.5 mg Oral Daily  . multivitamin with minerals  1 tablet Oral Daily  . traZODone  200 mg Oral QHS    Continuous Infusions: . heparin 950 Units/hr (12/13/20 0809)    Procedures/Studies: CT Angio Chest PE W/Cm &/Or Wo Cm  Result Date: 12/07/2020 CLINICAL DATA:  Chest pain for several days and elevated D-dimer. EXAM: CT ANGIOGRAPHY CHEST WITH CONTRAST TECHNIQUE: Multidetector CT imaging of the chest was performed using the standard protocol during bolus administration of intravenous contrast. Multiplanar CT image reconstructions and MIPs were obtained to evaluate the vascular anatomy. CONTRAST:  53mL OMNIPAQUE IOHEXOL 350 MG/ML SOLN COMPARISON:  None. FINDINGS: Cardiovascular: The heart is borderline enlarged for age. No pericardial effusion. Significant age advanced atherosclerotic calcifications involving the thoracic aorta and branch vessels. Three-vessel coronary artery calcifications are noted. The pulmonary arterial tree is well opacified. No filling defects to suggest pulmonary embolism. Mediastinum/Nodes: Borderline right hilar lymph nodes likely reactive/inflammatory. No mediastinal adenopathy. There is a large hiatal hernia. Lungs/Pleura: Severe emphysematous changes and pulmonary scarring. Extensive fluid and debris filling the right lower lobe bronchus and its branches highly suggestive acute aspiration. There is also mild peribronchial thickening and peribronchial inflammatory changes. Right lower lobe atelectasis is noted adjacent to the hiatal hernia. No pleural effusions. No worrisome pulmonary lesions. Upper Abdomen: Chronic appearing left-sided hydronephrosis with fairly significant renal cortical thinning. Aortic and branch vessel calcifications are noted. Low-attenuation left adrenal gland lesion, likely benign adenoma. Musculoskeletal: No breast masses, supraclavicular or axillary adenopathy. The bony thorax is intact. No worrisome bone lesions or acute fractures. Review of the MIP images confirms the above findings. IMPRESSION: 1. No CT findings for pulmonary embolism. 2. Significant  age advanced atherosclerotic calcifications involving the thoracic aorta and branch vessels including the coronary arteries. 3. Severe emphysematous changes and pulmonary scarring. 4. Extensive fluid and debris filling the right lower lobe bronchus and its branches highly suggestive of acute aspiration. There is also mild peribronchial thickening and peribronchial inflammatory changes. 5. Large hiatal hernia. 6. Chronic appearing left-sided hydronephrosis with fairly significant renal cortical thinning. 7. Emphysema and aortic atherosclerosis. Aortic Atherosclerosis (ICD10-I70.0) and Emphysema (ICD10-J43.9). Electronically Signed   By: Marijo Sanes M.D.   On: 12/07/2020 12:33   DG Chest Port 1 View  Result Date: 12/07/2020 CLINICAL DATA:  cough EXAM: PORTABLE CHEST 1 VIEW COMPARISON:  10/10/2020 and prior. FINDINGS: No focal consolidation. No pneumothorax or pleural effusion. Stable cardiomediastinal silhouette including moderate hiatal hernia. Aortic atherosclerotic calcifications. No acute osseous abnormality. IMPRESSION: No focal airspace disease. Electronically Signed   By: Primitivo Gauze M.D.   On: 12/07/2020 10:12   ECHOCARDIOGRAM COMPLETE  Result Date: 12/09/2020    ECHOCARDIOGRAM REPORT   Patient Name:   Mindy Mills Date of Exam: 12/09/2020 Medical Rec #:  [id number]        Height:       63.0 in Accession #:    [id number]       Weight:       104.0 lb Date of Birth:  07-10-1951         BSA:          1.464 m Patient Age:    77 years         BP:           115/90 mmHg Patient Gender: F                HR:           94 bpm. Exam Location:  [facility] Procedure: 2D Echo Indications:    Elevated Troponin  History:        Patient has prior history of Echocardiogram examinations, most                 recent 09/10/2015. TIA; Risk Factors:Former Smoker.                 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  Sonographer:    Leavy Cella RDCS (AE) Referring Phys: [place]  1. The mid to distal anteroseptal,anterior and anterolateral walls are hypokinetic. The apex is hypokinetic. There is a 0.7 x 1.3 cm mural apical thrombus. . Left ventricular ejection fraction, by estimation, is 25 to 30%. The left ventricle has severely decreased function. The left ventricle demonstrates regional wall motion abnormalities (see scoring diagram/findings for description). There is moderate left ventricular hypertrophy. Left ventricular diastolic parameters are consistent with Grade II diastolic dysfunction (pseudonormalization). Elevated left atrial pressure.  2. Right ventricular systolic function is normal. The right ventricular size is normal.  3. Left atrial size was mildly dilated.  4. The mitral valve is normal in structure. Trivial mitral valve regurgitation. No evidence of mitral stenosis. Moderate mitral annular calcification.  5. The aortic valve was not well visualized. There is mild calcification of the aortic valve. There is mild thickening of the aortic valve. Aortic valve regurgitation is not visualized. No aortic stenosis is present.  6. The inferior vena cava is normal in size with greater than 50% respiratory variability, suggesting right atrial pressure of 3 mmHg. FINDINGS  Left Ventricle: The mid to distal anteroseptal,anterior and anterolateral walls are hypokinetic. The apex is hypokinetic. There is a 0.7 x 1.3 cm mural apical thrombus. Left ventricular ejection fraction, by estimation, is 25 to 30%. The left ventricle has severely decreased function. The left ventricle demonstrates regional wall motion abnormalities. The left ventricular internal cavity size was normal in size. There is moderate left ventricular hypertrophy. Left ventricular diastolic parameters are consistent with Grade II diastolic dysfunction  (pseudonormalization). Elevated left atrial pressure. Right Ventricle: The right ventricular size is normal. No increase in right ventricular wall thickness. Right ventricular systolic function is normal. Left Atrium: Left atrial size was mildly dilated. Right Atrium: Right atrial size was normal in size. Pericardium: There is no evidence of pericardial  effusion. Mitral Valve: The mitral valve is normal in structure. There is mild thickening of the mitral valve leaflet(s). There is mild calcification of the mitral valve leaflet(s). Moderate mitral annular calcification. Trivial mitral valve regurgitation. No evidence of mitral valve stenosis. Tricuspid Valve: The tricuspid valve is normal in structure. Tricuspid valve regurgitation is mild . No evidence of tricuspid stenosis. Aortic Valve: The aortic valve was not well visualized. There is mild calcification of the aortic valve. There is mild thickening of the aortic valve. There is mild aortic valve annular calcification. Aortic valve regurgitation is not visualized. No aortic stenosis is present. Aortic valve mean gradient measures 2.8 mmHg. Aortic valve peak gradient measures 5.0 mmHg. Aortic valve area, by VTI measures 1.84 cm. Pulmonic Valve: The pulmonic valve was not well visualized. Pulmonic valve regurgitation is not visualized. No evidence of pulmonic stenosis. Aorta: The aortic root is normal in size and structure. Pulmonary Artery: Indeterminant PASP, inadequate TR jet. Venous: The inferior vena cava is normal in size with greater than 50% respiratory variability, suggesting right atrial pressure of 3 mmHg. IAS/Shunts: No atrial level shunt detected by color flow Doppler.  LEFT VENTRICLE PLAX 2D LVIDd:         3.41 cm  Diastology LVIDs:         2.51 cm  LV e' medial:    3.37 cm/s LV PW:         1.28 cm  LV E/e' medial:  20.7 LV IVS:        1.45 cm  LV e' lateral:   4.90 cm/s LVOT diam:     1.90 cm  LV E/e' lateral: 14.2 LV SV:         29 LV SV Index:    20 LVOT Area:     2.84 cm  RIGHT VENTRICLE RV S prime:     7.18 cm/s TAPSE (M-mode): 1.7 cm LEFT ATRIUM             Index       RIGHT ATRIUM           Index LA diam:        3.60 cm 2.46 cm/m  RA Area:     13.90 cm LA Vol (A2C):   52.9 ml 36.12 ml/m RA Volume:   41.30 ml  28.20 ml/m LA Vol (A4C):   43.9 ml 29.98 ml/m LA Biplane Vol: 48.1 ml 32.84 ml/m  AORTIC VALVE AV Area (Vmax):    1.83 cm AV Area (Vmean):   1.61 cm AV Area (VTI):     1.84 cm AV Vmax:           111.69 cm/s AV Vmean:          80.592 cm/s AV VTI:            0.159 m AV Peak Grad:      5.0 mmHg AV Mean Grad:      2.8 mmHg LVOT Vmax:         71.98 cm/s LVOT Vmean:        45.672 cm/s LVOT VTI:          0.103 m LVOT/AV VTI ratio: 0.65  AORTA Ao Root diam: 2.90 cm MITRAL VALVE               TRICUSPID VALVE MV Area (PHT): 4.63 cm    TR Peak grad:   37.0 mmHg MV Decel Time: 164 msec    TR Vmax:  304.00 cm/s MV E velocity: 69.80 cm/s MV A velocity: 41.10 cm/s  SHUNTS MV E/A ratio:  1.70        Systemic VTI:  0.10 m                            Systemic Diam: 1.90 cm Carlyle Dolly MD Electronically signed by Carlyle Dolly MD Signature Date/Time: 12/09/2020/12:44:45 PM    Final     Orson Eva, DO  [facility]  If 7PM-7AM, please contact night-coverage [email or web address] Password [email or web address] 12/13/2020, 11:10 AM   LOS: 6 days

## 2020-12-13 NOTE — Progress Notes (Signed)
ANTICOAGULATION CONSULT NOTE - Follow Up Consult  Pharmacy Consult for Heparin Indication: cardiac thrombus and stroke  Allergies  Allergen Reactions  . Bee Venom Anaphylaxis and Hives  . Capsicum (Cayenne) [Cayenne] Swelling    Patient Measurements: Height: 5\' 3"  (160 cm) Weight: 47.7 kg (105 lb 2.6 oz) IBW/kg (Calculated) : 52.4 Heparin Dosing Weight: 47.7 kg  Vital Signs: Temp: 97.7 F (36.5 C) (12/17 1948) Temp Source: Oral (12/17 1102) BP: 96/52 (12/17 1948) Pulse Rate: 60 (12/17 1948)  Labs: Recent Labs    12/12/20 0634 12/13/20 0608 12/13/20 1115 12/13/20 2032  HGB 14.8 13.2 12.8  --   HCT 47.4* 42.3 40.5  --   PLT 250 246 256  --   APTT  --   --  89*  --   LABPROT  --   --  13.2  --   INR  --   --  1.0  --   HEPARINUNFRC 0.67 0.85*  --  0.39  CREATININE 1.22* 1.25* 1.17*  --     Estimated Creatinine Clearance: 34.7 mL/min (A) (by C-G formula based on SCr of 1.17 mg/dL (H)).  Assessment:  69 yr old female started on IV heparin on 12/10/20 for apical thrombus. Prior hx SAH, cleared for anticoagulation by Neuro.  Possible LHC/RHC later this admission.  New CVA today, now using low therapeutic goal for heparin dosing.    Heparin level supratherapeutic (0.85) this morning on 950 units/hr > infusion rate decreased.     Heparin level is therapeutic (0.39) tonight on 800 units/hr.  Goal of Therapy:  Heparin level 0.3-0.5 units/ml Monitor platelets by anticoagulation protocol: Yes   Plan:   Continue heparin drip at 800 units/hr   Daily heparin level and CBC while on heparin.  Arty Baumgartner, [place] 12/13/2020,9:13 PM

## 2020-12-13 NOTE — Evaluation (Signed)
Clinical/Bedside Swallow Evaluation Patient Details  Name: Mindy Mills MRN: [id number] Date of Birth: 1951-05-21  Today's Date: 12/13/2020 Time: SLP Start Time (ACUTE ONLY): 68 SLP Stop Time (ACUTE ONLY): 1652 SLP Time Calculation (min) (ACUTE ONLY): 22 min  Past Medical History:  Past Medical History:  Diagnosis Date  . Cancer ([place])    Cervicle  . Full dentures   . Hepatitis C    denies  . Hypertension   . Migraine   . Myocardial infarction ([place])   . Stroke [facility]) 2015   Pt reports hx of CVA x3.  Last one in 2015  . Wears glasses   . Wears hearing aid in both ears    Past Surgical History:  Past Surgical History:  Procedure Laterality Date  . ABDOMINAL HYSTERECTOMY    . BUNIONECTOMY    . COLONOSCOPY    . MULTIPLE TOOTH EXTRACTIONS    . OPEN REDUCTION INTERNAL FIXATION (ORIF) DISTAL RADIAL FRACTURE Right 09/07/2018   Procedure: OPEN REDUCTION INTERNAL FIXATION (ORIF) DISTAL RADIAL FRACTURE;  Surgeon: Iran Planas, MD;  Location: [place];  Service: Orthopedics;  Laterality: Right;  . PEG PLACEMENT N/A 09/21/2014   Procedure: PERCUTANEOUS ENDOSCOPIC GASTROSTOMY (PEG) PLACEMENT (BED SIDE);  Surgeon: Georganna Skeans, MD;  Location: [place];  Service: General;  Laterality: N/A;  . RADIOLOGY WITH ANESTHESIA N/A 09/11/2014   Procedure: Sub Arachnoid Aneurysm Coiling -RADIOLOGY WITH ANESTHESIA ;  Surgeon: Consuella Lose, MD;  Location: [place];  Service: Radiology;  Laterality: N/A;   HPI:  Mindy Mills is a 69 y.o. 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  Patient has since had PEG tube and tracheostomy removed.  She reports that for the past 3 days, she has progressive shortness of breath and cough.  She denies any fever.  She denies any vomiting.  She has not felt as though she is been choking after eating or drinking.  She has not had any chest  pain.  She feels increasingly weak.  No dysuria, diarrhea.  Since she had progressive shortness of breath, she called EMS for further evaluation. CT of chest showed: Extensive fluid and debris filling the right lower lobe bronchus and its branches highly suggestive of acute aspiration. There is also mild peribronchial thickening and peribronchial inflammatory changes. Large hiatal hernia. Pt now (12/13/20) with acute CVA, MRI shows:Acute moderate size left MCA territory infarct. Discontiguous small  acute infarct of the left caudate. Third small acute infarct of the  right cerebellum. BSE requested.   Assessment / Plan / Recommendation Clinical Impression  Pt was seen on Monday for a BSE and SLP signed off, however Pt now with acute CVA with aphasia. Pt passed [employer] screen earlier today, however BSE ordered. Pt presents with moderate oral phase and suspected pharyngeal phase dysphagia characterized by reduced labial seal with right facial weakness, oral holding with delay in swallow trigger resulting in immediate and delayed coughing with thin liquids. Pt was noted to be impulsive with intake and required hand over hand assist to remove cup (spilling and coughing). Pt with improved oral control and no signs of aspiration with puree and honey thick liquids. Will change diet, however Pt will need f/u SLP services at [facility] upon transfer. Pt will benefit from MBSS. SLP Visit Diagnosis: Dysphagia, unspecified (R13.10)    Aspiration Risk  Moderate aspiration risk    Diet Recommendation Dysphagia 1 (Puree);Honey-thick liquid  Liquid Administration via: Cup Medication Administration: Whole meds with puree Supervision: Patient able to self feed;Full supervision/cueing for compensatory strategies Compensations: Small sips/bites;Lingual sweep for clearance of pocketing;Monitor for anterior loss Postural Changes: Seated upright at 90 degrees;Remain upright for at least 30 minutes after po intake     Other  Recommendations Oral Care Recommendations: Oral care BID Other Recommendations: Clarify dietary restrictions   Follow up Recommendations Skilled Nursing facility      Frequency and Duration min 2x/week  1 week       Prognosis Prognosis for Safe Diet Advancement: Fair Barriers to Reach Goals: Severity of deficits;Language deficits      Swallow Study   General Date of Onset: 12/13/20 HPI: Mindy Mills is a 69 y.o. 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  Patient has since had PEG tube and tracheostomy removed.  She reports that for the past 3 days, she has progressive shortness of breath and cough.  She denies any fever.  She denies any vomiting.  She has not felt as though she is been choking after eating or drinking.  She has not had any chest pain.  She feels increasingly weak.  No dysuria, diarrhea.  Since she had progressive shortness of breath, she called EMS for further evaluation. CT of chest showed: Extensive fluid and debris filling the right lower lobe bronchus and its branches highly suggestive of acute aspiration. There is also mild peribronchial thickening and peribronchial inflammatory changes. Large hiatal hernia. Pt now (12/13/20) with acute CVA, MRI shows:Acute moderate size left MCA territory infarct. Discontiguous small  acute infarct of the left caudate. Third small acute infarct of the  right cerebellum. BSE requested. Type of Study: Bedside Swallow Evaluation Previous Swallow Assessment: BSE on Monday with recommendation for mech soft and thin Diet Prior to this Study: Dysphagia 3 (soft);Thin liquids (Pt passed [facility] swallow screen with RN today) Temperature Spikes Noted: No Respiratory Status: Nasal cannula History of Recent Intubation: No Behavior/Cognition: Alert;Cooperative (aphasic) Oral Cavity Assessment: Within Functional  Limits Oral Care Completed by SLP: Recent completion by staff Oral Cavity - Dentition: Edentulous Vision: Functional for self-feeding Self-Feeding Abilities: Needs set up Patient Positioning: Upright in bed Baseline Vocal Quality: Not observed Volitional Cough: Cognitively unable to elicit Volitional Swallow: Unable to elicit    Oral/Motor/Sensory Function Overall Oral Motor/Sensory Function: Moderate impairment Facial ROM: Reduced right;Suspected CN VII (facial) dysfunction Facial Symmetry: Abnormal symmetry right Facial Strength: Reduced right Lingual Symmetry:  (suspect, Pt would not protrude)   Ice Chips Ice chips: Not tested   Thin Liquid Thin Liquid: Impaired Presentation: Cup;Straw;Self Fed Oral Phase Impairments: Reduced labial seal Oral Phase Functional Implications: Right anterior spillage;Oral holding (impulsive with intake) Pharyngeal  Phase Impairments: Suspected delayed Swallow;Cough - Delayed;Cough - Immediate    Nectar Thick Nectar Thick Liquid: Impaired Presentation: Cup;Self Fed Oral Phase Impairments: Reduced labial seal Oral phase functional implications: Right anterior spillage;Oral holding Pharyngeal Phase Impairments: Suspected delayed Swallow   Honey Thick Honey Thick Liquid: Impaired Presentation: Cup Oral Phase Impairments: Reduced labial seal Pharyngeal Phase Impairments: Suspected delayed Swallow   Puree Puree: Within functional limits Presentation: Spoon   Solid     Solid: Not tested     Thank you,  Genene Churn, [place]  [place] 12/13/2020,7:16 PM

## 2020-12-13 NOTE — Progress Notes (Signed)
   12/12/20 2325  Assess: MEWS Score  Temp 98.8 F (37.1 C)  BP (!) 77/45  Pulse Rate 73  Resp 18  Level of Consciousness Alert  SpO2 94 %  O2 Device Nasal Cannula  O2 Flow Rate (L/min) 3 L/min  Assess: MEWS Score  MEWS Temp 0  MEWS Systolic 2  MEWS Pulse 0  MEWS RR 0  MEWS LOC 0  MEWS Score 2  MEWS Score Color Yellow  Assess: if the MEWS score is Yellow or Red  Were vital signs taken at a resting state? Yes  Focused Assessment No change from prior assessment  Early Detection of Sepsis Score *See Row Information* Low  MEWS guidelines implemented *See Row Information* No, vital signs rechecked  Take Vital Signs  Increase Vital Sign Frequency  Yellow: Q 2hr X 2 then Q 4hr X 2, if remains yellow, continue Q 4hrs  Escalate  MEWS: Escalate Yellow: discuss with charge nurse/RN and consider discussing with provider and RRT  Notify: Charge Nurse/RN  Name of Charge Nurse/RN Notified Syed Zukas  Date Charge Nurse/RN Notified 12/12/20  Time Charge Nurse/RN Notified 2325  Notify: Provider  Provider Name/Title Kennon Holter, MD  Date Provider Notified 12/12/20  Time Provider Notified 2333  Notification Type Page  Notification Reason Other (Comment) (yellow mews with low bp)  New orders given, NS 250cc bolus IV.

## 2020-12-13 NOTE — Consult Note (Addendum)
[facility] Telemedicine Consult  Requesting Provider: Dr. Orson Eva, [facility] hospitalist Consult Participants: Dr. Jerelyn Charles, Telespecialist RN-Anna, bedside RN Jonelle Sidle Location of the provider: [place] [facility] Location of the patient: Mindy Mills room 329.  This consult was provided via telemedicine with 2-way video and audio communication. The patient/family was informed that care would be provided in this way and agreed to receive care in this manner.   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   Last known well was 9 AM today when she was given a bath and after that was noted that she was unable to talk and appeared altered and confused when trying to give her medications around 10:31 AM.. Primary physician evaluated the patient and activated a code stroke.  She had received a head CT prior to notification to me. The head CT is suspicious for an acute left MCA infarct involving the insula and possibly the operculum with an aspects of 8-9.  Hospitalist note says that the code stroke was activated at 11 AM but the Tele-specialist team was not notified up until 11:38 AM and I was paged at 11:43 AM.  CT head had already been done by the time   Past Medical History:  Diagnosis Date  . Cancer ([place])    Cervicle  . Full dentures   . Hepatitis C    denies  . Hypertension   . Migraine   . Myocardial infarction ([place])   . Stroke [facility]) 2015   Pt reports hx of CVA x3.  Last one in 2015  . Wears glasses   . Wears hearing aid in both ears      Current Facility-Administered Medications:  .  albuterol (PROVENTIL) (2.5 MG/3ML) 0.083%  nebulizer solution 2.5 mg, 2.5 mg, Nebulization, Q4H PRN, Kathie Dike, MD .  amoxicillin-clavulanate (AUGMENTIN) 875-125 MG per tablet 1 tablet, 1 tablet, Oral, Q12H, Kathie Dike, MD, 1 tablet at 12/12/20 2231 .  arformoterol (BROVANA) nebulizer solution 15 mcg, 15 mcg, Nebulization, BID, Tat, David, MD, 15 mcg at 12/13/20 0815 .  aspirin EC tablet 325 mg, 325 mg, Oral, Daily, Memon, Jehanzeb, MD, 325 mg at 12/13/20 1030 .  budesonide (PULMICORT) nebulizer solution 0.5 mg, 0.5 mg, Nebulization, BID, Tat, David, MD, 0.5 mg at 12/13/20 0815 .  feeding supplement (ENSURE ENLIVE / ENSURE PLUS) liquid 237 mL, 237 mL, Oral, BID BM, Tat, David, MD, 237 mL at 12/12/20 1034 .  FLUoxetine (PROZAC) capsule 80 mg, 80 mg, Oral, Daily, Memon, Jolaine Artist, MD, 80 mg at 12/13/20 1031 .  [START ON 12/14/2020] furosemide (LASIX) tablet 40 mg, 40 mg, Oral, Daily, Branch, Alphonse Guild, MD .  guaiFENesin (MUCINEX) 12 hr tablet 600 mg, 600 mg, Oral, BID, Memon, Jehanzeb, MD, 600 mg at 12/13/20 1031 .  heparin ADULT infusion 100 units/mL (25000 units/271mL sodium chloride 0.45%), 950 Units/hr, Intravenous, Continuous, Tat, David, MD, Last Rate: 9.5 mL/hr at 12/13/20 0809, 950 Units/hr at 12/13/20 0809 .  ipratropium-albuterol (DUONEB) 0.5-2.5 (3) MG/3ML nebulizer solution 3 mL, 3 mL, Nebulization, BID, Tat, David, MD, 3 mL at 12/13/20 0815 .  metoprolol succinate (TOPROL-XL) 24 hr tablet 12.5 mg, 12.5 mg, Oral, Daily, Branch, Alphonse Guild, MD,  12.5 mg at 12/13/20 1031 .  multivitamin with minerals tablet 1 tablet, 1 tablet, Oral, Daily, Tat, David, MD, 1 tablet at 12/13/20 1031 .  ondansetron (ZOFRAN) injection 4 mg, 4 mg, Intravenous, Q6H PRN, Tat, David, MD, 4 mg at 12/12/20 1731 .  traMADol (ULTRAM) tablet 50 mg, 50 mg, Oral, Q6H PRN, Kathie Dike, MD, 50 mg at 12/09/20 1803 .  traZODone (DESYREL) tablet 200 mg, 200 mg, Oral, QHS, Memon, Jehanzeb, MD, 200 mg at 12/12/20 2231    LKW: 0900 12/13/2020 tpa given?:  No, on heparin drip IR Thrombectomy? No, unfavorable perfusion profile Moderate can: According to the hospitalist, prior to arrival was driving a car and walking with a cane-2 at best. Time of teleneurologist evaluation: 1:47 AM  Exam: Vitals:   12/13/20 1102 12/13/20 1202  BP: (!) 81/33 (!) 83/54  Pulse: 68 66  Resp: 12 13  Temp: 99 F (37.2 C) 98.4 F (36.9 C)  SpO2: 95% 98%   General: Awake alert HEENT appears normocephalic atraumatic Neurological exam She is awake alert Not following any commands Appears to be staring in space at times Difficult to assess for gaze deviation - but appears to have left gaze preference-is able come to midline but not see all the way to the right. Did not follow commands to lift her arms up but when the nurse held her arms up both arms were antigravity without drift Both lower extremities had mild drift against gravity   NIHSS 1A: Level of Consciousness - 0 1B: Ask Month and Age - 2 1C: 'Blink Eyes' & 'Squeeze Hands' - 2 2: Test Horizontal Extraocular Movements - 1 3: Test Visual Fields - 0 4: Test Facial Palsy - 0 5A: Test Left Arm Motor Drift - 0 5B: Test Right Arm Motor Drift - 0 6A: Test Left Leg Motor Drift - 1 6B: Test Right Leg Motor Drift - 1 7: Test Limb Ataxia - 0 8: Test Sensation - 0 9: Test Language/Aphasia- 3 10: Test Dysarthria - 2 11: Test Extinction/Inattention - 0 NIHSS score: 12   Imaging Reviewed:  CT head with left insular and left frontal operculum hypodensity concerning for an evolving infarct.  Aspects 8-9 CT angio with left superior division M2 occlusion. CT perfusion below with one-to-one match of the core and penumbra corresponding to the occlusion seen on the CTA.   Labs reviewed in epic and pertinent values follow: CBC    Component Value Date/Time   WBC 9.2 12/13/2020 1115   RBC 4.17 12/13/2020 1115   HGB 12.8 12/13/2020 1115   HCT 40.5 12/13/2020 1115   PLT 256 12/13/2020 1115   MCV 97.1  12/13/2020 1115   MCH 30.7 12/13/2020 1115   MCHC 31.6 12/13/2020 1115   RDW 14.0 12/13/2020 1115   LYMPHSABS 1.7 12/07/2020 0953   MONOABS 1.0 12/07/2020 0953   EOSABS 0.0 12/07/2020 0953   BASOSABS 0.1 12/07/2020 0953   CMP     Component Value Date/Time   [place] 139 12/13/2020 1115   K 4.0 12/13/2020 1115   CL 105 12/13/2020 1115   CO2 26 12/13/2020 1115   GLUCOSE 111 (H) 12/13/2020 1115   BUN 15 12/13/2020 1115   CREATININE 1.17 (H) 12/13/2020 1115   CALCIUM 8.2 (L) 12/13/2020 1115   PROT 6.0 (L) 12/13/2020 1115   ALBUMIN 2.6 (L) 12/13/2020 1115   AST 25 12/13/2020 1115   ALT 23 12/13/2020 1115   ALKPHOS 47 12/13/2020 1115   BILITOT 0.6 12/13/2020 1115  GFRNONAA 51 (L) 12/13/2020 1115   GFRAA 54 (L) 08/31/2018 1632     Assessment: 69 year old woman with extensive cardiovascular and cerebrovascular risk factors admitted for aspiration pneumonia, CHF exacerbation, LV thrombus on heparin drip with sudden onset of aphasia and confusion and altered mental status. Clinically consistent with a left MCA stroke. CT head with changes consistent with a left MCA stroke with an aspects of 8-9. CTA head and neck with a left superior division M2 occlusion and a CT perfusion study with one-to-one match of the core and penumbra with no tissue to save. Unfortunately, she is not a candidate for TPA due to being on a heparin drip. Due to the unfavorable perfusion findings, she is also not a candidate for thrombectomy at this time for the risk of hemorrhagic conversion. I question the accuracy of the last known normal based on imaging findings.  Acute ischemic stroke-likely cardioembolic in the setting of LV thrombus   Recommendations:  I would recommend transferring her to [facility] as there is no neurology coverage over the weekend at [facility]. If there is any delay in transfer, please ensure that the in-house neurologist is able to see the patient this evening. I would  recommend heparin drip at lower heparin level or low PTT-stroke protocol I would also recommend repeating a head CT in about 6 hours-at 7 PM. She has already had a 2D echocardiogram. Continue frequent neurochecks Continue telemetry Continue management of the aspiration pneumonia per primary team as you are Check A1c and lipid panel She will also need an MRI at some point. I will update the neurologist at [facility] to expect this patient which will need to be followed by the stroke team. Dr. Carles Collet and I spoke over the phone-he is going to make arrangements for the patient to be transferred to the hospitalist service at [facility].   Discussed with Dr. Carles Collet. Imaging findings discussed with Dr. Jeralyn Ruths Imaging findings also discussed with Dr. Estanislado Pandy over the phone  This patient is receiving care for possible acute neurological changes. There was 70 minutes of critical care care by this provider at the time of service, including time for direct evaluation via telemedicine, review of medical records, imaging studies and discussion of findings with providers, the patient and/or family.  -- Amie Portland, MD [facility] Pager: [phone] If 7pm to 7am, please call on call as listed on AMION.

## 2020-12-13 NOTE — Progress Notes (Addendum)
ANTICOAGULATION CONSULT NOTE - [place] for heparin infusion Indication: cardiac thrombus   Recent Labs    12/11/20 0655 12/11/20 1607 12/11/20 2325 12/12/20 0634 12/13/20 0608  HGB 14.0  --   --  14.8 13.2  HCT 45.1  --   --  47.4* 42.3  PLT 261  --   --  250 246  HEPARINUNFRC 0.39   < > 0.58 0.67 0.85*  CREATININE 1.09*  --   --  1.22* 1.25*   < > = values in this interval not displayed.    Estimated Creatinine Clearance: 32.4 mL/min (A) (by C-G formula based on SCr of 1.25 mg/dL (H)).   Assessment: Pharmacy consulted to dose heparin infusion for this  69 yo female   with elevated troponins up to 300, but now trending down. EKG shows precordial TWI and baseline CBC is WNL. ECHO shows apical thrombus.  Patient   received Lovenox 40mg  last night at 2145 and is concurrently on aspicrin 325mg  per day.   Patient to possibly have cath later this admission.  HL 0.85- supratherapeutic   Update 12/17 1300: patient with stroke- per neurology will aim for 0.3-0.5 now. Will lower dose even more.  Goal of Therapy:  Heparin level 0.3-0.5 Monitor platelets by anticoagulation protocol: Yes   Plan:  Decrease heparin infusion to 800 units/hr heparin level in 6 hours and daily Monitor H&H and s/s of bleeding  Margot Ables, PharmD Clinical Pharmacist 12/13/2020 8:06 AM

## 2020-12-13 NOTE — Progress Notes (Signed)
[place]

## 2020-12-13 NOTE — Progress Notes (Signed)
Physical Therapy Treatment Patient Details Name: Mindy Mills MRN: [id number] DOB: [birth year] Today's Date: 12/13/2020    History of Present Illness Mindy Mills is a 69 y.o. 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  Patient has since had PEG tube and tracheostomy removed.  She reports that for the past 3 days, she has progressive shortness of breath and cough.  She denies any fever.  She denies any vomiting.  She has not felt as though she is been choking after eating or drinking.  She has not had any chest pain.  She feels increasingly weak.  No dysuria, diarrhea.  Since she had progressive shortness of breath, she called EMS for further evaluation    PT Comments    PT requested BP taken before session secondary to the night's BP very low. This BP was 91/41, resulting in completing bed mobility. Patient soiled when PT checked feet for socks, resulting in cleaning patient and changing sheets with bed mobility as primary focus of session. Demo good safety and skill during bed mobility. Patient left in room with nurse tech to finish changing and cleaning patient. Patient will benefit from continued physical therapy in hospital and recommended venue below to increase strength, balance, endurance for safe ADLs and gait.    Follow Up Recommendations  SNF;Supervision for mobility/OOB;Supervision - Intermittent     Equipment Recommendations  Rolling walker with 5" wheels    Recommendations for Other Services       Precautions / Restrictions Precautions Precautions: Fall Restrictions Weight Bearing Restrictions: No    Mobility  Bed Mobility Overal bed mobility: Needs Assistance Bed Mobility: Rolling Rolling: Min guard         General bed mobility comments: increased time, labored movement  Transfers                    Ambulation/Gait                  Stairs             Wheelchair Mobility    Modified Rankin (Stroke Patients Only)       Balance                                            Cognition Arousal/Alertness: Awake/alert Behavior During Therapy: WFL for tasks assessed/performed Overall Cognitive Status: Within Functional Limits for tasks assessed                                        Exercises General Exercises - Lower Extremity Ankle Circles/Pumps: AROM;Strengthening;Both;Supine Heel Slides: AROM;Strengthening;Both;5 reps;Supine    General Comments        Pertinent Vitals/Pain Pain Assessment: No/denies pain    Home Living                      Prior Function            PT Goals (current goals can now be found in the care plan section) Acute Rehab PT Goals Patient Stated Goal: return home with roommate to assist PT Goal Formulation: With patient Time For Goal Achievement: 12/23/20 Potential to Achieve Goals: Good Progress towards PT goals: Progressing  toward goals    Frequency    Min 3X/week      PT Plan Current plan remains appropriate    Co-evaluation              AM-PAC PT "6 Clicks" Mobility   Outcome Measure  Help needed turning from your back to your side while in a flat bed without using bedrails?: None Help needed moving from lying on your back to sitting on the side of a flat bed without using bedrails?: None Help needed moving to and from a bed to a chair (including a wheelchair)?: A Little Help needed standing up from a chair using your arms (e.g., wheelchair or bedside chair)?: A Little Help needed to walk in hospital room?: A Little Help needed climbing 3-5 steps with a railing? : A Lot 6 Click Score: 19    End of Session Equipment Utilized During Treatment: Oxygen Activity Tolerance: Patient limited by fatigue;Treatment limited secondary to medical complications (Comment);Other (comment) (BP 91/41  in supine, completed bed exercises and assisted with bed mobility while assisting with cleaning after bowel movement.) Patient left: in bed;with nursing/sitter in room;with call bell/phone within reach Nurse Communication: Mobility status PT Visit Diagnosis: Other abnormalities of gait and mobility (R26.89);Muscle weakness (generalized) (M62.81);Unsteadiness on feet (R26.81)     Time: 8343-7357 PT Time Calculation (min) (ACUTE ONLY): 20 min  Charges:  $Therapeutic Activity: 8-22 mins                     10:18 AM,12/13/20 Domenic Moras, PT, DPT Physical Therapist at [facility]

## 2020-12-14 ENCOUNTER — Inpatient Hospital Stay (HOSPITAL_COMMUNITY): Payer: Medicare Other

## 2020-12-14 DIAGNOSIS — I639 Cerebral infarction, unspecified: Secondary | ICD-10-CM

## 2020-12-14 DIAGNOSIS — R4182 Altered mental status, unspecified: Secondary | ICD-10-CM

## 2020-12-14 LAB — CBC
HCT: 37.7 % (ref 36.0–46.0)
Hemoglobin: 11.8 g/dL — ABNORMAL LOW (ref 12.0–15.0)
MCH: 31.1 pg (ref 26.0–34.0)
MCHC: 31.3 g/dL (ref 30.0–36.0)
MCV: 99.2 fL (ref 80.0–100.0)
Platelets: 236 10*3/uL (ref 150–400)
RBC: 3.8 MIL/uL — ABNORMAL LOW (ref 3.87–5.11)
RDW: 13.9 % (ref 11.5–15.5)
WBC: 9.5 10*3/uL (ref 4.0–10.5)
nRBC: 0 % (ref 0.0–0.2)

## 2020-12-14 LAB — COMPREHENSIVE METABOLIC PANEL WITH GFR
ALT: 21 U/L (ref 0–44)
AST: 21 U/L (ref 15–41)
Albumin: 2.6 g/dL — ABNORMAL LOW (ref 3.5–5.0)
Alkaline Phosphatase: 45 U/L (ref 38–126)
Anion gap: 8 (ref 5–15)
BUN: 12 mg/dL (ref 8–23)
CO2: 27 mmol/L (ref 22–32)
Calcium: 8.5 mg/dL — ABNORMAL LOW (ref 8.9–10.3)
Chloride: 107 mmol/L (ref 98–111)
Creatinine, Ser: 1 mg/dL (ref 0.44–1.00)
GFR, Estimated: >60 mL/min
Glucose, Bld: 82 mg/dL (ref 70–99)
Potassium: 3.7 mmol/L (ref 3.5–5.1)
Sodium: 142 mmol/L (ref 135–145)
Total Bilirubin: 0.6 mg/dL (ref 0.3–1.2)
Total Protein: 5.8 g/dL — ABNORMAL LOW (ref 6.5–8.1)

## 2020-12-14 LAB — MAGNESIUM: Magnesium: 2.1 mg/dL (ref 1.7–2.4)

## 2020-12-14 LAB — HEPARIN LEVEL (UNFRACTIONATED)
Heparin Unfractionated: 0.22 [IU]/mL — ABNORMAL LOW (ref 0.30–0.70)
Heparin Unfractionated: 0.21 [IU]/mL — ABNORMAL LOW (ref 0.30–0.70)
Heparin Unfractionated: 0.43 IU/mL (ref 0.30–0.70)

## 2020-12-14 LAB — CREATININE, SERUM
Creatinine, Ser: 1.02 mg/dL — ABNORMAL HIGH (ref 0.44–1.00)
GFR, Estimated: 60 mL/min — ABNORMAL LOW

## 2020-12-14 MED ORDER — SODIUM CHLORIDE 0.9 % IV SOLN
3.0000 g | Freq: Four times a day (QID) | INTRAVENOUS | Status: DC
  Administered 2020-12-15 – 2020-12-16 (×8): 3 g via INTRAVENOUS
  Filled 2020-12-14 (×2): qty 3
  Filled 2020-12-14 (×6): qty 8
  Filled 2020-12-14: qty 3
  Filled 2020-12-14: qty 8
  Filled 2020-12-14: qty 3
  Filled 2020-12-14: qty 8

## 2020-12-14 NOTE — Progress Notes (Signed)
EEG complete - results pending 

## 2020-12-14 NOTE — Consult Note (Signed)
Neurology Consult H&P  CC: Acute left MCA stroke  History is obtained from: chart and to a lesser extent patient.  HPI: Mindy Mills is a 69 y.o. female PMHx migraine, COPD, CHF, aneurysmal SAH s/p Acom coiling 2015 admitted for acute hypoxic respiratory failure found to have aspiration pneumonia and apical thrombus on heparin drip on  0900 on 12/13/2020 noted inability to speak, difficulty walking and looking confused ~1031  on 12/13/2020. Code stroke activated showing acute left MCA infarct involving the insula and possibly the operculum and transferred to [facility].  tpa given?: No, [id number] IR Thrombectomy? No, lvo Modified Rankin Scale: functional status not known NIHSS: 15  ROS: Unable to assess due to aphasia.  Past Medical History:  Diagnosis Date  . Cancer ([employer])    Cervicle  . Full dentures   . Hepatitis C    denies  . Hypertension   . Migraine   . Myocardial infarction ([place])   . Stroke [facility]) 2015   Pt reports hx of CVA x3.  Last one in 2015  . Wears glasses   . Wears hearing aid in both ears      Family History  Problem Relation Age of Onset  . Alcohol abuse Father     Social History:  reports that she quit smoking about 5 years ago. Her smoking use included cigarettes. She smoked 0.50 packs per day. She has never used smokeless tobacco. She reports previous alcohol use. She reports that she does not use drugs.   Prior to Admission medications   Medication Sig Start Date End Date Taking? Authorizing Provider  aspirin 325 MG EC tablet Take 325 mg by mouth daily.   Yes [provider]  cholecalciferol (VITAMIN D) 1000 units tablet Take 1,000 Units by mouth daily.   Yes [provider]  FLUoxetine (PROZAC) 20 MG capsule Take 80 mg by mouth daily.   Yes [provider]  traZODone (DESYREL) 100 MG tablet Take 200 mg by mouth at bedtime.   Yes [provider]  clonazePAM (KLONOPIN) 0.5 MG tablet Take 1 tablet (0.5 mg total) by mouth 2  (two) times daily. Patient not taking: No sig reported 09/11/15   Jonetta Osgood, MD  docusate sodium (COLACE) 100 MG capsule Take 1 capsule (100 mg total) by mouth 2 (two) times daily. Patient not taking: No sig reported 09/09/18   Gertie Fey Bonham, PA  EPINEPHrine 0.3 mg/0.3 mL IJ SOAJ injection Inject 0.3 mg into the muscle once as needed (for allergic reaction).    [provider]  feeding supplement, ENSURE ENLIVE, (ENSURE ENLIVE) LIQD Take 237 mLs by mouth daily. Patient not taking: Reported on 12/07/2020 09/11/15   Jonetta Osgood, MD  methocarbamol (ROBAXIN) 500 MG tablet Take 1 tablet (500 mg total) by mouth every 6 (six) hours as needed for muscle spasms. Patient not taking: No sig reported 09/09/18   Brynda Peon, PA  oxyCODONE-acetaminophen (PERCOCET) 5-325 MG tablet Take 1-2 tablets by mouth every 6 (six) hours as needed. Patient not taking: No sig reported 08/31/18   Charlesetta Shanks, MD  pantoprazole (PROTONIX) 40 MG tablet Take 1 tablet (40 mg total) by mouth at bedtime. Patient not taking: No sig reported 09/11/15   Jonetta Osgood, MD  rosuvastatin (CRESTOR) 40 MG tablet Take 20 mg by mouth daily. Patient not taking: No sig reported    [provider]  traMADol (ULTRAM) 50 MG tablet Take 100 mg by mouth 4 (four) times daily as  needed for moderate pain. Patient not taking: No sig reported    [provider]    Exam: Current vital signs: BP 108/67 (BP Location: Left Arm)   Pulse 80   Temp 98.9 F (37.2 C) (Oral)   Resp 18   Ht 5\' 3"  (1.6 m)   Wt 45.5 kg   SpO2 94%   BMI 17.77 kg/m   Physical Exam  Constitutional: Appears well-developed and well-nourished.  Psych: Unable to assess due to aphasia Eyes: No scleral injection HENT: No OP obstrucion Head: Normocephalic.  Cardiovascular: Normal rate and regular rhythm.  Respiratory: Effort normal and breath sounds normal to anterior ascultation GI: Soft.  No distension.  There is no tenderness.  Skin: WDI  Neuro: Mental Status: Patient is awake, alert and follows commands consistently. Patient is mute likely severe expressive aphasia. No neglect. Cranial Nerves: II: Visual Fields are full. Pupils are equal, round, and reactive to light. III,IV, VI: EOMI without ptosis or diploplia.  V: Facial sensation is symmetric to temperature VII: Facial movement is asymmetric left droop.  VIII: hearing is intact to voice X: Uvula elevates symmetrically XI: Shoulder shrug is symmetric. XII: tongue is midline without atrophy or fasciculations.  Motor: Tone is normal. Bulk is normal. Decreased strength diffusely with ~3/5 left upper and 2/5 left lower. Sensory: Sensation is symmetric to light touch and temperature in the arms and legs. Deep Tendon Reflexes: 2+ and symmetric in the biceps and patellae. Plantars: Toes up on left Cerebellar: unable to test.  I have reviewed labs in epic and the pertinent results are:   Ref. Range 12/14/2020 04:26  Potassium Latest Ref Range: 3.5 - 5.1 mmol/L 3.7  Chloride Latest Ref Range: 98 - 111 mmol/L 107  CO2 Latest Ref Range: 22 - 32 mmol/L 27  Glucose Latest Ref Range: 70 - 99 mg/dL 82  BUN Latest Ref Range: 8 - 23 mg/dL 12  Creatinine Latest Ref Range: 0.44 - 1.00 mg/dL 1.00  Calcium Latest Ref Range: 8.9 - 10.3 mg/dL 8.5 (L)   I have reviewed the images obtained: NCT head 12/13/2020 showed hypodensity in left insula and operculum as well as loss of insular ribbon suggesting large evolving acute ischemic stroke in left superior division MCA territory   MRI Brain showed Acute moderate size left MCA territory infarct. Discontiguous small acute infarct of the left caudate. Third small acute infarct of the right cerebellum.  Assessment: JEWELIANNA Mills is a 69 y.o. female right handed PMHx migraine, COPD, CHF, aneurysmal SAH s/p Acom coil 2015 with acute hypoxic respiratory failure aspiration pneumonia and known apical  thrombus with acute embolic strokes left MCA superior division, right cerebellum and left caudate now mute likely severe expressive aphasia and left sided weakness lower >upper (though effort dependent).  Plan: - Recommend vascular imaging with MRA head and neck. - Continue heparin drip at "lower heparin level or low PTT- stroke protocol" as previously recommended. - Recommend labs: HbA1c, lipid panel, TSH. - Recommend Statin if LDL > 70 - SBP <140 for now and gradually continue to reduce as needed. - Telemetry monitoring for arrhythmia. - Recommend bedside Swallow screen. - Recommend Stroke education. - Recommend PT/OT/SLP consult.  Electronically signed by: Dr. Lynnae Sandhoff Pager: [phone] 12/14/2020, 6:13 PM

## 2020-12-14 NOTE — Procedures (Signed)
Patient Name: Mindy Mills  MRN: [id number]  Epilepsy Attending: Lora Havens  Referring Physician/Provider: Dr Shanon Brow Tat Date: 12/14/2020 Duration: 23.39 mins  Patient history: 69 y.o. female right handed PMHx migraine, COPD,CHF, aneurysmal SAH s/p Acomcoil 2015 and known apical thrombus with acute embolic strokes left MCA superior division, right cerebellum and left caudate now mute likely severe expressive aphasia and left sided weakness lower >upper (though effort dependent). EEG to evaluate for seizure  Level of alertness: Awake  AEDs during EEG study: None  Technical aspects: This EEG study was done with scalp electrodes positioned according to the 10-20 International system of electrode placement. Electrical activity was acquired at a sampling rate of 500Hz  and reviewed with a high frequency filter of 70Hz  and a low frequency filter of 1Hz . EEG data were recorded continuously and digitally stored.   Description: The posterior dominant rhythm consists of 9-10 Hz activity of moderate voltage (25-35 uV) seen predominantly in posterior head regions, symmetric and reactive to eye opening and eye closing. EEG showed continuous 3 to 6 Hz theta-delta slowing in left hemisphere, maximal left frontal region which at times appear rhythmic. Sharp transients were also seen in left frontotemporal region.  Hyperventilation and photic stimulation were not performed.     ABNORMALITY -Continued slow, left hemisphere, maximal left frontal region  IMPRESSION: This study is suggestive of cortical dysfunction in left hemisphere, maximal left frontal region secondary to underlying structural abnormality/stroke. No seizures or definite epileptiform discharges were seen throughout the recording.  Karys Meckley Barbra Sarks

## 2020-12-14 NOTE — Progress Notes (Signed)
ANTICOAGULATION CONSULT NOTE - Follow Up Consult  Pharmacy Consult for Heparin Indication: cardiac thrombus and stroke  Allergies  Allergen Reactions  . Bee Venom Anaphylaxis and Hives  . Capsicum (Cayenne) [Cayenne] Swelling    Patient Measurements: Height: 5\' 3"  (160 cm) Weight: 47.9 kg (105 lb 9.6 oz) IBW/kg (Calculated) : 52.4 Heparin Dosing Weight: 47.7 kg  Vital Signs: Temp: 98.8 F (37.1 C) (12/18 0553) Temp Source: Oral (12/18 0553) BP: 94/44 (12/18 0553) Pulse Rate: 65 (12/18 0553)  Labs: Recent Labs    12/13/20 0608 12/13/20 1115 12/13/20 2032 12/14/20 0426  HGB 13.2 12.8  --  11.8*  HCT 42.3 40.5  --  37.7  PLT 246 256  --  236  APTT  --  89*  --   --   LABPROT  --  13.2  --   --   INR  --  1.0  --   --   HEPARINUNFRC 0.85*  --  0.39 0.22*  CREATININE 1.25* 1.17*  --  1.00  1.02*    Estimated Creatinine Clearance: 39.9 mL/min (A) (by C-G formula based on SCr of 1.02 mg/dL (H)).  Assessment:  69 yr old female started on IV heparin on 12/10/20 for apical thrombus. Prior hx SAH, cleared for anticoagulation by Neuro.  Possible LHC/RHC later this admission.  New CVA today, now using low therapeutic goal for heparin dosing.    Heparin level 0.22  Goal of Therapy:  Heparin level 0.3-0.5 units/ml Monitor platelets by anticoagulation protocol: Yes   Plan:  Increase heparin infusion to 900 units/hr  Heparin level in 6 hours and daily.  Margot Ables, PharmD Clinical Pharmacist 12/14/2020 7:51 AM

## 2020-12-14 NOTE — Progress Notes (Addendum)
BP 88/41 No po meds given during shift as pt was too lethargic when due. Continues to open eyes and attempt to follow commands although weak. Notified Dr. Josephine Cables to make aware. States to continue to closely monitor.

## 2020-12-14 NOTE — Progress Notes (Signed)
ANTICOAGULATION CONSULT NOTE - Follow Up Consult  Pharmacy Consult for Heparin Indication: cardiac thrombus and stroke  Allergies  Allergen Reactions  . Bee Venom Anaphylaxis and Hives  . Capsicum (Cayenne) [Cayenne] Swelling    Patient Measurements: Height: 5\' 3"  (160 cm) Weight: 47.9 kg (105 lb 9.6 oz) IBW/kg (Calculated) : 52.4 Heparin Dosing Weight: 47.7 kg  Vital Signs: Temp: 98.8 F (37.1 C) (12/18 0553) Temp Source: Oral (12/18 1400) BP: 108/67 (12/18 1400) Pulse Rate: 73 (12/18 1400)  Labs: Recent Labs    12/13/20 0608 12/13/20 1115 12/13/20 2032 12/14/20 0426 12/14/20 1409  HGB 13.2 12.8  --  11.8*  --   HCT 42.3 40.5  --  37.7  --   PLT 246 256  --  236  --   APTT  --  89*  --   --   --   LABPROT  --  13.2  --   --   --   INR  --  1.0  --   --   --   HEPARINUNFRC 0.85*  --  0.39 0.22* 0.21*  CREATININE 1.25* 1.17*  --  1.00  1.02*  --     Estimated Creatinine Clearance: 39.9 mL/min (A) (by C-G formula based on SCr of 1.02 mg/dL (H)).  Assessment:  69 yr old female started on IV heparin on 12/10/20 for apical thrombus. Prior hx SAH, cleared for anticoagulation by Neuro.  Possible LHC/RHC later this admission.  New CVA today, now using low therapeutic goal for heparin dosing.    Heparin level 0.21- subtherapeutic  Goal of Therapy:  Heparin level 0.3-0.5 units/ml Monitor platelets by anticoagulation protocol: Yes   Plan:  Increase heparin infusion to 1000 units/hr  Heparin level in 6 hours and daily.  Margot Ables, PharmD Clinical Pharmacist 12/14/2020 2:40 PM

## 2020-12-14 NOTE — Progress Notes (Signed)
PROGRESS NOTE  TRANISE Mills UVO:[id number] DOB: April 05, 1951 DOA: 12/07/2020 PCP: Patient, No Pcp Per  Brief History:  69 y.o.femalewith medical history significant ofmigraines, previous intracerebral hemorrhage in 2016 which resulted in prolonged course including tracheostomy and PEG tube. Patient has since had PEG tube and tracheostomy removed. She reports that for the past 3 days, she has progressive shortness of breath and cough. She denies any fever. She denies any vomiting. She has not felt as though she is been choking after eating or drinking. She has not had any chest pain. She feels increasingly weak. No dysuria, diarrhea. Since she had progressive shortness of breath, she called EMS for further evaluation  ED Course:Upon arrival to the emergency room, patient is noted to have a mildly elevated creatinine. Oxygen has been applied since she was noted to be hypoxic. Oxygen saturations were 88% on room air. D-dimer was noted to be elevated and CT of the chest was performed that indicated evidence of aspiration pneumonia. COVID-19 test was found to be negative. Troponin mildly elevated around 300 and EKG showed T wave inversions in the anterolateral leads. She was referred for admission.  AM 12/13/20--patient was noted to be dysphasic, not responding to questions nor following commands by RN, and had right hemiparesis.  Code Stroke activated at 1100.  Work up revealed new acute L-MCA territory stroke, likely cardioembolic.  Teleneuro was activated and neurology was consulted.  Heparin drip was continued per stroke protocol dosing.  Plan is to transfer to [facility] for continued stroke care.    Assessment/Plan: Acute Cardioembolic Stroke -source due to LV thrombus -appreciate neurology consult -awaiting transfer to [facility] -12/17 CT brain-suspicious for an acute left MCA infarct involving the insula and possibly the operculum with an aspects of 8-9- -12/17  MR brain--Acute moderate size left MCA territory infarct. Discontiguous small acute infarct of the left caudate. Third small acute infarct of the right cerebellum. -12/17 CTA H&N--Left M2 superior division occlusion; Associated left MCA territory infarct without evidence of a significant penumbra on CTP.  Patent carotid and vertebral arteries -continue IV heparin -A1C-- -Lipid panel-- -speech eval-->D1 with NTL -PT/OT eval -sister and daughter-in-law updated -12/18--pt is more awake and alert, following commands and feeding herself  Acute respiratory failure with hypoxia -Secondary to pneumoniaand [employer] setting of COPD -currently stable on 4L>>3L with saturation 94-95% -Wean off oxygen as tolerated -wean oxygen for saturation 88-92%  Aspiration pneumonia -12/1 CTA chest--no PE; extensive fluid and debris RLL bronchus -Started on Unasyn12/12>>12.14 -Continue pulmonary hygiene with bronchodilators and mucolytic's -seen by speech therapy with recommendations for D3 diet -Change Unasyn to Augmentin 12/14>>back to unasyn due to dysphagia  Acute systolic CHF -74/25/95GL 87-56%, G2DD, +apical thrombus, trivial MR -previous history of Takotsubo CM in 2015 -continue IV lasix--had 3 doses, last on 12/16 -cardiology following, appreciate input -holdingtoprol due to soft Ps -d/c losartain due to soft BPs -plans noted for possiblecardiac cath next week if stable -lasix restarted po  COPD -continue duoneb/pulmicort -continue brovana -50 pack year hx -IS  Elevated troponin -Patient did not complain of any chest pain -She does have some EKG changes, mainly TWI in precordial leads -Troponin elevated at 333, delta troponin flat at 338 -Echo shows low EF -appreciate cardiology assistance -could be demand ischemia related to pneumonia. Can also see elevated trops in setting of stress induced CM. Cannot exlcude ischemia. 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 -Patient had aneurysm coiled at that time -Seen by neurology and felt reasonable to start on dual antiplatelet therapy versus anticoagulation as needed  CKDIIIa -Creatinine is currently at baseline -baseline 1.0-1.2 -monitor with diuresis  Generalized weakness -seen by physical therapy with recommendations for SNF--pt refuses initially  Hypokalemia -replete -check mag--2.0  Severe Malnutrition -continue nutrition supplements      Status is: Inpatient  Remains inpatient appropriate because:IV treatments appropriate due to intensity of illness or inability to take PO   Dispo: The patient is from:Home Anticipated d/c is to:SNF if changes mind Anticipated d/c date is: 2 days Patient currently is not medically stable to d/c.        Family Communication:sister and daughter-in-law updated  Consultants:Cardiology, neurology  Code Status: DNR--discussed with daughter-in-law who is main advocate for patient;  Sister not willing to make med decisions  DVT Prophylaxis:IVHeparin   Procedures: As Listed in Progress Note Above  Antibiotics: Unasyn 12/12>>12/14 Amox/clav 12/14>>12/17 Unasyn 12/17>>       Subjective: Patient denies fevers, chills, headache, chest pain, dyspnea, nausea, vomiting, diarrhea, abdominal pain, dysuria, hematuria, hematochezia, and melena.   Objective: Vitals:   12/14/20 0553 12/14/20 0731 12/14/20 0735 12/14/20 0741  BP: (!) 94/44     Pulse: 65     Resp: 18     Temp: 98.8 F (37.1 C)     TempSrc: Oral     SpO2: 97% 92% 92% 92%  Weight: 47.9 kg     Height:        Intake/Output Summary (Last 24 hours) at 12/14/2020 0925 Last data filed at 12/14/2020 0300 Gross per 24 hour  Intake 372.2 ml  Output --  Net  372.2 ml   Weight change: 0.2 kg Exam:   General:  Pt is alert, follows commands appropriately, not in acute distress  HEENT: No icterus, No thrush, No neck mass, [place]/AT  Cardiovascular: RRR, S1/S2, no rubs, no gallops  Respiratory: bibasilar crackles. No wheeze  Abdomen: Soft/+BS, non tender, non distended, no guarding  Extremities: 1+LE edema, No lymphangitis, No petechiae, No rashes, no synovitis   Data Reviewed: I have personally reviewed following labs and imaging studies Basic Metabolic Panel: Recent Labs  Lab 12/11/20 0655 12/12/20 0634 12/13/20 0608 12/13/20 1115 12/14/20 0426  NA 142 140 139 139 142  K 3.4* 3.1* 3.7 4.0 3.7  CL 104 102 104 105 107  CO2 28 29 28 26 27   GLUCOSE 100* 96 100* 111* 82  BUN 14 16 17 15 12   CREATININE 1.09* 1.22* 1.25* 1.17* 1.00  1.02*  CALCIUM 7.9* 8.4* 8.3* 8.2* 8.5*  MG  --   --  2.0  --  2.1   Liver Function Tests: Recent Labs  Lab 12/07/20 0953 12/08/20 0705 12/13/20 1115 12/14/20 0426  AST 66* 43* 25 21  ALT 95* 74* 23 21  ALKPHOS 72 60 47 45  BILITOT 0.7 0.7 0.6 0.6  PROT 8.0 7.3 6.0* 5.8*  ALBUMIN 3.5 3.2* 2.6* 2.6*   No results for input(s): LIPASE, AMYLASE in the last 168 hours. Recent Labs  Lab 12/13/20 1429  AMMONIA 19   Coagulation Profile: Recent Labs  Lab 12/13/20 1115  INR 1.0   CBC: Recent Labs  Lab 12/07/20 0953 12/08/20 0705 12/11/20 0655 12/12/20 0634 12/13/20 0608 12/13/20 1115 12/14/20 0426  WBC 14.9*   < > 10.1 8.2 9.7 9.2 9.5  NEUTROABS 12.1*  --   --   --   --   --   --   HGB 16.8*   < > 14.0 14.8 13.2 12.8 11.8*  HCT 53.1*   < > 45.1 47.4* 42.3 40.5 37.7  MCV 96.0   < > 96.4 97.1 97.7 97.1 99.2  PLT 319   < > 261 250 246 256 236   < > = values in this interval not displayed.   Cardiac Enzymes: Recent Labs  Lab 12/07/20 1604  CKTOTAL 348*   BNP: Invalid input(s): POCBNP CBG: Recent Labs  Lab 12/08/20 1557 12/13/20 1104  GLUCAP 104* 94   HbA1C: No results for  input(s): HGBA1C in the last 72 hours. Urine analysis:    Component Value Date/Time   COLORURINE YELLOW 12/13/2020 1825   APPEARANCEUR CLEAR 12/13/2020 1825   LABSPEC >1.046 (H) 12/13/2020 1825   PHURINE 6.0 12/13/2020 1825   GLUCOSEU NEGATIVE 12/13/2020 1825   HGBUR NEGATIVE 12/13/2020 1825   BILIRUBINUR NEGATIVE 12/13/2020 1825   KETONESUR 5 (A) 12/13/2020 1825   PROTEINUR NEGATIVE 12/13/2020 1825   UROBILINOGEN 1.0 09/08/2015 1817   NITRITE NEGATIVE 12/13/2020 1825   LEUKOCYTESUR NEGATIVE 12/13/2020 1825   Sepsis Labs: @LABRCNTIP (procalcitonin:4,lacticidven:4) ) Recent Results (from the past 240 hour(s))  Resp Panel by RT-PCR (Flu A&B, Covid) Nasopharyngeal Swab     Status: None   Collection Time: 12/07/20  9:37 AM   Specimen: Nasopharyngeal Swab; Nasopharyngeal(NP) swabs in vial transport medium  Result Value Ref Range Status   SARS Coronavirus 2 by RT PCR NEGATIVE NEGATIVE Final    Comment: (NOTE) SARS-CoV-2 target nucleic acids are NOT DETECTED.  The SARS-CoV-2 RNA is generally detectable in upper respiratory specimens during the acute phase of infection. The lowest concentration of SARS-CoV-2 viral copies this assay can detect is 138 copies/mL. A negative result does not preclude SARS-Cov-2 infection and should not be used as the sole basis for treatment or other patient management decisions. A negative result may occur with  improper specimen collection/handling, submission of specimen other than nasopharyngeal swab, presence of viral mutation(s) within the areas targeted by this assay, and inadequate number of viral copies(<138 copies/mL). A negative result must be combined with clinical observations, patient history, and epidemiological information. The expected result is Negative.  Fact Sheet for Patients:  EntrepreneurPulse.com.au  Fact Sheet for Healthcare Providers:  IncredibleEmployment.be  This test is no t yet  approved or cleared by the [place] FDA and  has been authorized for detection and/or diagnosis of SARS-CoV-2 by FDA under an Emergency Use Authorization (EUA). This EUA will remain  in effect (meaning this test can be used) for the duration of the COVID-19 declaration under Section 564(b)(1) of the Act, 21 U.S.C.section 360bbb-3(b)(1), unless the authorization is terminated  or revoked sooner.       Influenza A by PCR NEGATIVE NEGATIVE Final   Influenza B by PCR NEGATIVE NEGATIVE Final    Comment: (NOTE) The Xpert Xpress SARS-CoV-2/FLU/RSV plus assay is intended as an aid in the diagnosis of influenza from Nasopharyngeal swab specimens and should not be used as a sole basis for treatment. Nasal washings and aspirates are unacceptable for Xpert Xpress SARS-CoV-2/FLU/RSV testing.  Fact Sheet for Patients: EntrepreneurPulse.com.au  Fact Sheet for Healthcare Providers: IncredibleEmployment.be  This test is not yet approved or cleared by the [place] FDA and has been authorized for detection and/or diagnosis of SARS-CoV-2 by FDA under an Emergency Use Authorization (EUA). This EUA will remain in  effect (meaning this test can be used) for the duration of the COVID-19 declaration under Section 564(b)(1) of the Act, 21 U.S.C. section 360bbb-3(b)(1), unless the authorization is terminated or revoked.  Performed at [facility], [address] [place], [place] [address]   Blood Culture (routine x 2)     Status: None   Collection Time: 12/07/20  9:53 AM   Specimen: Right Antecubital; Blood  Result Value Ref Range Status   Specimen Description   Final    RIGHT ANTECUBITAL BOTTLES DRAWN AEROBIC AND ANAEROBIC   Special Requests Blood Culture adequate volume  Final   Culture   Final    NO GROWTH 5 DAYS Performed at [facility], [address] [place], [place] [address]    Report Status 12/12/2020 FINAL  Final  Blood Culture (routine x 2)      Status: None   Collection Time: 12/07/20 11:22 AM   Specimen: Right Antecubital; Blood  Result Value Ref Range Status   Specimen Description   Final    RIGHT ANTECUBITAL BOTTLES DRAWN AEROBIC AND ANAEROBIC   Special Requests Blood Culture adequate volume  Final   Culture   Final    NO GROWTH 5 DAYS Performed at [facility], [address] [place], [place] [address]    Report Status 12/12/2020 FINAL  Final     Scheduled Meds: . arformoterol  15 mcg Nebulization BID  . aspirin  300 mg Rectal Daily  . budesonide (PULMICORT) nebulizer solution  0.5 mg Nebulization BID  . feeding supplement  237 mL Oral BID BM  . FLUoxetine  80 mg Oral Daily  . furosemide  40 mg Oral Daily  . guaiFENesin  600 mg Oral BID  . ipratropium-albuterol  3 mL Nebulization BID  . metoprolol succinate  12.5 mg Oral Daily   Continuous Infusions: . ampicillin-sulbactam (UNASYN) IV 3 g (12/14/20 0916)  . heparin 900 Units/hr (12/14/20 0913)    Procedures/Studies: CT Code Stroke CTA Head W/WO contrast  Result Date: 12/13/2020 CLINICAL DATA:  Code stroke.  Aphasia. EXAM: CT ANGIOGRAPHY HEAD AND NECK CT PERFUSION BRAIN TECHNIQUE: Multidetector CT imaging of the head and neck was performed using the standard protocol during bolus administration of intravenous contrast. Multiplanar CT image reconstructions and MIPs were obtained to evaluate the vascular anatomy. Carotid stenosis measurements (when applicable) are obtained utilizing NASCET criteria, using the distal internal carotid diameter as the denominator. Multiphase CT imaging of the brain was performed following IV bolus contrast injection. Subsequent parametric perfusion maps were calculated using RAPID software. CONTRAST:  13mL OMNIPAQUE IOHEXOL 350 MG/ML SOLN COMPARISON:  Head MRA 09/09/2015 and CTA 09/19/2014 FINDINGS: CTA NECK FINDINGS Aortic arch: Common origin of the brachiocephalic and left common carotid arteries. Moderate atherosclerotic plaque in the  aortic arch without evidence of a significant arch vessel origin stenosis. Right carotid system: Patent with mild calcified and soft plaque about the carotid bifurcation and in the mid common carotid artery. No evidence of significant stenosis or dissection. Left carotid system: Patent with mild calcified and soft plaque about the carotid bifurcation and in the mid common carotid artery without associated significant stenosis. Tortuous proximal to mid cervical ICA with a loop and focally kinked appearance versus a small web. Vertebral arteries: The vertebral arteries are patent with scattered atherosclerotic plaque throughout their cervical segments bilaterally. No significant stenosis or dissection is identified. Codominant. Skeleton: Mild-to-moderate cervical disc degeneration. Other neck: Subcentimeter right thyroid nodules, considered clinically insignificant and for which no follow-up imaging is  recommended. Upper chest: Moderate centrilobular emphysema. Partially visualized small pleural effusions. Review of the MIP images confirms the above findings CTA HEAD FINDINGS Anterior circulation: The internal carotid arteries are patent from skull base to carotid termini with mild atherosclerotic plaque bilaterally not resulting in a significant stenosis. The left M1 segment is widely patent, however there is occlusion of the left M2 superior division near its origin. The left M2 inferior division is patent. The right MCA and both ACAs are patent without evidence of a significant proximal stenosis. 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 Posterior circulation: The intracranial vertebral arteries are widely patent to the basilar. Patent PICA and SCA origins are seen bilaterally. The basilar artery is widely patent. Posterior communicating arteries are diminutive or absent. Both PCAs are patent without  evidence of a significant proximal stenosis. No aneurysm is identified. Venous sinuses: Patent. Anatomic variants: None. Review of the MIP images confirms the above findings CT Brain Perfusion Findings: ASPECTS: 8 CBF (<30%) Volume: 44 mL Perfusion (Tmax>6.0s) volume: 43 mL Mismatch Volume: None Infarction Location: Left MCA territory centered in the opercular region IMPRESSION: 1. Left M2 superior division occlusion. 2. Associated left MCA territory infarct without evidence of a significant penumbra on CTP. 3. Patent carotid and vertebral arteries. Loop in the proximal left ICA with focal kinking versus a small web. 4. Previous anterior communicating aneurysm coiling. 5. Aortic Atherosclerosis (ICD10-I70.0) and Emphysema (ICD10-J43.9). Emergent finding of left M2 occlusion was discussed by telephone on 12/13/2020 at 12:45 p.m. with Dr. Amie Portland , who verbally acknowledged these results. Electronically Signed   By: Logan Bores M.D.   On: 12/13/2020 13:14   CT HEAD WO CONTRAST  Result Date: 12/13/2020 CLINICAL DATA:  Follow-up code stroke.  Right hemiparesis. EXAM: CT HEAD WITHOUT CONTRAST TECHNIQUE: Contiguous axial images were obtained from the base of the skull through the vertex without intravenous contrast. COMPARISON:  CT and MRI studies earlier same day FINDINGS: Brain: No abnormality is seen affecting the brainstem or cerebellum. There is now loss of gray-white differentiation affecting the left frontal operculum consistent with the region of acute infarction shown by MRI. Overall, this measures approximately 5-6 cm in size. No gross hemorrhagic transformation. There may be some minor petechial hyperdensity along the anterior aspect of the region of infarction. No mass effect or shift. Chronic small-vessel ischemic changes of the white matter as seen previously. Old right frontal cortical and subcortical infarction. 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 No hydrocephalus.  No extra-axial collection. Vascular: No acute vascular finding by CT. Skull: Negative Sinuses/Orbits: Clear/normal Other: None IMPRESSION: 1. Loss of gray-white differentiation affecting the left frontal operculum consistent with the region of acute infarction shown by MRI. Overall, this measures approximately 5-6 cm in size. No gross hemorrhagic transformation. Possible early petechial blood hyperdensity in the anterior portion of the infarction. No mass effect or shift. 2. 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 3. Chronic small-vessel ischemic changes of the white matter. Old right frontal cortical and subcortical infarction. Electronically Signed   By: Nelson Chimes M.D.   On: 12/13/2020 20:06   CT Code Stroke CTA Neck W/WO contrast  Result Date: 12/13/2020 CLINICAL DATA:  Code stroke.  Aphasia. EXAM: CT ANGIOGRAPHY HEAD AND NECK CT PERFUSION BRAIN TECHNIQUE: Multidetector CT imaging of the head and neck was performed using the standard protocol during  bolus administration of intravenous contrast. Multiplanar CT image reconstructions and MIPs were obtained to evaluate the vascular anatomy. Carotid stenosis measurements (when applicable) are obtained utilizing NASCET criteria, using the distal internal carotid diameter as the denominator. Multiphase CT imaging of the brain was performed following IV bolus contrast injection. Subsequent parametric perfusion maps were calculated using RAPID software. CONTRAST:  128mL OMNIPAQUE IOHEXOL 350 MG/ML SOLN COMPARISON:  Head MRA 09/09/2015 and CTA 09/19/2014 FINDINGS: CTA NECK FINDINGS Aortic arch: Common origin of the brachiocephalic and left common carotid arteries. Moderate atherosclerotic plaque in the aortic arch without evidence of a significant arch vessel origin stenosis. Right carotid system: Patent with mild calcified and soft plaque about the carotid bifurcation and in the mid common carotid artery. No evidence of significant  stenosis or dissection. Left carotid system: Patent with mild calcified and soft plaque about the carotid bifurcation and in the mid common carotid artery without associated significant stenosis. Tortuous proximal to mid cervical ICA with a loop and focally kinked appearance versus a small web. Vertebral arteries: The vertebral arteries are patent with scattered atherosclerotic plaque throughout their cervical segments bilaterally. No significant stenosis or dissection is identified. Codominant. Skeleton: Mild-to-moderate cervical disc degeneration. Other neck: Subcentimeter right thyroid nodules, considered clinically insignificant and for which no follow-up imaging is recommended. Upper chest: Moderate centrilobular emphysema. Partially visualized small pleural effusions. Review of the MIP images confirms the above findings CTA HEAD FINDINGS Anterior circulation: The internal carotid arteries are patent from skull base to carotid termini with mild atherosclerotic plaque bilaterally not resulting in a significant stenosis. The left M1 segment is widely patent, however there is occlusion of the left M2 superior division near its origin. The left M2 inferior division is patent. The right MCA and both ACAs are patent without evidence of a significant proximal stenosis. 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 Posterior circulation: The intracranial vertebral arteries are widely patent to the basilar. Patent PICA and SCA origins are seen bilaterally. The basilar artery is widely patent. Posterior communicating arteries are diminutive or absent. Both PCAs are patent without evidence of a significant proximal stenosis. No aneurysm is identified. Venous sinuses: Patent. Anatomic variants: None. Review of the MIP images confirms the above findings CT Brain Perfusion Findings: ASPECTS: 8 CBF (<30%) Volume: 44 mL  Perfusion (Tmax>6.0s) volume: 43 mL Mismatch Volume: None Infarction Location: Left MCA territory centered in the opercular region IMPRESSION: 1. Left M2 superior division occlusion. 2. Associated left MCA territory infarct without evidence of a significant penumbra on CTP. 3. Patent carotid and vertebral arteries. Loop in the proximal left ICA with focal kinking versus a small web. 4. Previous anterior communicating aneurysm coiling. 5. Aortic Atherosclerosis (ICD10-I70.0) and Emphysema (ICD10-J43.9). Emergent finding of left M2 occlusion was discussed by telephone on 12/13/2020 at 12:45 p.m. with Dr. Amie Portland , who verbally acknowledged these results. Electronically Signed   By: Logan Bores M.D.   On: 12/13/2020 13:14   CT Angio Chest PE W/Cm &/Or Wo Cm  Result Date: 12/07/2020 CLINICAL DATA:  Chest pain for several days and elevated D-dimer. EXAM: CT ANGIOGRAPHY CHEST WITH CONTRAST TECHNIQUE: Multidetector CT imaging of the chest was performed using the standard protocol during bolus administration of intravenous contrast. Multiplanar CT image reconstructions and MIPs were obtained to evaluate the vascular anatomy. CONTRAST:  9mL OMNIPAQUE IOHEXOL 350 MG/ML SOLN COMPARISON:  None. FINDINGS: Cardiovascular: The heart is borderline enlarged  for age. No pericardial effusion. Significant age advanced atherosclerotic calcifications involving the thoracic aorta and branch vessels. Three-vessel coronary artery calcifications are noted. The pulmonary arterial tree is well opacified. No filling defects to suggest pulmonary embolism. Mediastinum/Nodes: Borderline right hilar lymph nodes likely reactive/inflammatory. No mediastinal adenopathy. There is a large hiatal hernia. Lungs/Pleura: Severe emphysematous changes and pulmonary scarring. Extensive fluid and debris filling the right lower lobe bronchus and its branches highly suggestive acute aspiration. There is also mild peribronchial thickening and  peribronchial inflammatory changes. Right lower lobe atelectasis is noted adjacent to the hiatal hernia. No pleural effusions. No worrisome pulmonary lesions. Upper Abdomen: Chronic appearing left-sided hydronephrosis with fairly significant renal cortical thinning. Aortic and branch vessel calcifications are noted. Low-attenuation left adrenal gland lesion, likely benign adenoma. Musculoskeletal: No breast masses, supraclavicular or axillary adenopathy. The bony thorax is intact. No worrisome bone lesions or acute fractures. Review of the MIP images confirms the above findings. IMPRESSION: 1. No CT findings for pulmonary embolism. 2. Significant age advanced atherosclerotic calcifications involving the thoracic aorta and branch vessels including the coronary arteries. 3. Severe emphysematous changes and pulmonary scarring. 4. Extensive fluid and debris filling the right lower lobe bronchus and its branches highly suggestive of acute aspiration. There is also mild peribronchial thickening and peribronchial inflammatory changes. 5. Large hiatal hernia. 6. Chronic appearing left-sided hydronephrosis with fairly significant renal cortical thinning. 7. Emphysema and aortic atherosclerosis. Aortic Atherosclerosis (ICD10-I70.0) and Emphysema (ICD10-J43.9). Electronically Signed   By: Marijo Sanes M.D.   On: 12/07/2020 12:33   MR BRAIN WO CONTRAST  Result Date: 12/13/2020 CLINICAL DATA:  Right hemiparesis EXAM: MRI HEAD WITHOUT CONTRAST TECHNIQUE: Multiplanar, multiecho pulse sequences of the brain and surrounding structures were obtained without intravenous contrast. COMPARISON:  2016 FINDINGS: DWI, sagittal T1, and axial T2 sequences were obtained. Motion artifact is present. Brain: There is restricted diffusion in the left MCA territory involving the frontal lobe and anterior insula. There is involvement of the lateral precentral gyrus as well as the operculum. Additional small area of acute infarction along the  body of the caudate on the left. There is a third small area of acute infarction in the right cerebellum. Small right parietal chronic infarct. Small chronic infarct of the left corona radiata. Additional areas of T2 hyperintensity in the supratentorial white matter probably reflect chronic microvascular ischemic changes. There is no intracranial mass or mass effect.  No hydrocephalus. Vascular: Major vessel flow voids at the skull base are preserved. Skull and upper cervical spine: Normal marrow signal is preserved. Sinuses/Orbits: Paranasal sinuses are aerated. Orbits are unremarkable. Other: Sella is unremarkable. Mild patchy mastoid fluid opacification. 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 Discontiguous small acute infarct of the left caudate. Third small acute infarct of the right cerebellum. Electronically Signed   By: Macy Mis M.D.   On: 12/13/2020 13:27   CT Code Stroke Cerebral Perfusion with contrast  Result Date: 12/13/2020 CLINICAL DATA:  Code stroke.  Aphasia. EXAM: CT ANGIOGRAPHY HEAD AND NECK CT PERFUSION BRAIN TECHNIQUE: Multidetector CT imaging of the head and neck was performed using the standard protocol during bolus administration of intravenous contrast. Multiplanar CT image reconstructions and MIPs were obtained to evaluate the vascular anatomy. Carotid stenosis measurements (when applicable) are obtained utilizing NASCET criteria, using the distal internal carotid diameter as the denominator. Multiphase CT imaging of the brain was performed following IV bolus contrast injection. Subsequent parametric perfusion maps were calculated using RAPID software. CONTRAST:  126mL OMNIPAQUE  IOHEXOL 350 MG/ML SOLN COMPARISON:  Head MRA 09/09/2015 and CTA 09/19/2014 FINDINGS: CTA NECK FINDINGS Aortic arch: Common origin of the brachiocephalic and left common carotid arteries. Moderate atherosclerotic plaque in the aortic arch without evidence of a significant arch vessel  origin stenosis. Right carotid system: Patent with mild calcified and soft plaque about the carotid bifurcation and in the mid common carotid artery. No evidence of significant stenosis or dissection. Left carotid system: Patent with mild calcified and soft plaque about the carotid bifurcation and in the mid common carotid artery without associated significant stenosis. Tortuous proximal to mid cervical ICA with a loop and focally kinked appearance versus a small web. Vertebral arteries: The vertebral arteries are patent with scattered atherosclerotic plaque throughout their cervical segments bilaterally. No significant stenosis or dissection is identified. Codominant. Skeleton: Mild-to-moderate cervical disc degeneration. Other neck: Subcentimeter right thyroid nodules, considered clinically insignificant and for which no follow-up imaging is recommended. Upper chest: Moderate centrilobular emphysema. Partially visualized small pleural effusions. Review of the MIP images confirms the above findings CTA HEAD FINDINGS Anterior circulation: The internal carotid arteries are patent from skull base to carotid termini with mild atherosclerotic plaque bilaterally not resulting in a significant stenosis. The left M1 segment is widely patent, however there is occlusion of the left M2 superior division near its origin. The left M2 inferior division is patent. The right MCA and both ACAs are patent without evidence of a significant proximal stenosis. 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 Posterior circulation: The intracranial vertebral arteries are widely patent to the basilar. Patent PICA and SCA origins are seen bilaterally. The basilar artery is widely patent. Posterior communicating arteries are diminutive or absent. Both PCAs are patent without evidence of a significant proximal stenosis. No aneurysm is  identified. Venous sinuses: Patent. Anatomic variants: None. Review of the MIP images confirms the above findings CT Brain Perfusion Findings: ASPECTS: 8 CBF (<30%) Volume: 44 mL Perfusion (Tmax>6.0s) volume: 43 mL Mismatch Volume: None Infarction Location: Left MCA territory centered in the opercular region IMPRESSION: 1. Left M2 superior division occlusion. 2. Associated left MCA territory infarct without evidence of a significant penumbra on CTP. 3. Patent carotid and vertebral arteries. Loop in the proximal left ICA with focal kinking versus a small web. 4. Previous anterior communicating aneurysm coiling. 5. Aortic Atherosclerosis (ICD10-I70.0) and Emphysema (ICD10-J43.9). Emergent finding of left M2 occlusion was discussed by telephone on 12/13/2020 at 12:45 p.m. with Dr. Amie Portland , who verbally acknowledged these results. Electronically Signed   By: Logan Bores M.D.   On: 12/13/2020 13:14   DG Chest Port 1 View  Result Date: 12/07/2020 CLINICAL DATA:  cough EXAM: PORTABLE CHEST 1 VIEW COMPARISON:  10/10/2020 and prior. FINDINGS: No focal consolidation. No pneumothorax or pleural effusion. Stable cardiomediastinal silhouette including moderate hiatal hernia. Aortic atherosclerotic calcifications. No acute osseous abnormality. IMPRESSION: No focal airspace disease. Electronically Signed   By: Primitivo Gauze M.D.   On: 12/07/2020 10:12   ECHOCARDIOGRAM COMPLETE  Result Date: 12/09/2020    ECHOCARDIOGRAM REPORT   Patient Name:   Mindy Mills Date of Exam: 12/09/2020 Medical Rec #:  [id number]        Height:       63.0 in Accession #:    [id number]       Weight:       104.0 lb Date of Birth:  17-Feb-1951  BSA:          1.464 m Patient Age:    64 years         BP:           115/90 mmHg Patient Gender: F                HR:           94 bpm. Exam Location:  [facility] Procedure: 2D Echo Indications:    Elevated Troponin  History:        Patient has prior history of Echocardiogram  examinations, most                 recent 09/10/2015. TIA; Risk Factors:Former Smoker.                 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  Sonographer:    Leavy Cella RDCS (AE) Referring Phys: [place]  1. The mid to distal anteroseptal,anterior and anterolateral walls are hypokinetic. The apex is hypokinetic. There is a 0.7 x 1.3 cm mural apical thrombus. . Left ventricular ejection fraction, by estimation, is 25 to 30%. The left ventricle has severely decreased function. The left ventricle demonstrates regional wall motion abnormalities (see scoring diagram/findings for description). There is moderate left ventricular hypertrophy. Left ventricular diastolic parameters are consistent with Grade II diastolic dysfunction (pseudonormalization). Elevated left atrial pressure.  2. Right ventricular systolic function is normal. The right ventricular size is normal.  3. Left atrial size was mildly dilated.  4. The mitral valve is normal in structure. Trivial mitral valve regurgitation. No evidence of mitral stenosis. Moderate mitral annular calcification.  5. The aortic valve was not well visualized. There is mild calcification of the aortic valve. There is mild thickening of the aortic valve. Aortic valve regurgitation is not visualized. No aortic stenosis is present.  6. The inferior vena cava is normal in size with greater than 50% respiratory variability, suggesting right atrial pressure of 3 mmHg. FINDINGS  Left Ventricle: The mid to distal anteroseptal,anterior and anterolateral walls are hypokinetic. The apex is hypokinetic. There is a 0.7 x 1.3 cm mural apical thrombus. Left ventricular ejection fraction, by estimation, is 25 to 30%. The left ventricle has severely decreased function. The left ventricle demonstrates regional wall  motion abnormalities. The left ventricular internal cavity size was normal in size. There is moderate left ventricular hypertrophy. Left ventricular diastolic parameters are consistent with Grade II diastolic dysfunction (pseudonormalization). Elevated left atrial pressure. Right Ventricle: The right ventricular size is normal. No increase in right ventricular wall thickness. Right ventricular systolic function is normal. Left Atrium: Left atrial size was mildly dilated. Right Atrium: Right atrial size was normal in size. Pericardium: There is no evidence of pericardial effusion. Mitral Valve: The mitral valve is normal in structure. There is mild thickening of the mitral valve leaflet(s). There is mild calcification of the mitral valve leaflet(s). Moderate mitral annular calcification. Trivial mitral valve regurgitation. No evidence of mitral valve stenosis. Tricuspid Valve: The tricuspid valve is normal in structure. Tricuspid valve regurgitation is mild . No evidence of tricuspid stenosis. Aortic Valve: The aortic valve was not well visualized. There is mild calcification of  the aortic valve. There is mild thickening of the aortic valve. There is mild aortic valve annular calcification. Aortic valve regurgitation is not visualized. No aortic stenosis is present. Aortic valve mean gradient measures 2.8 mmHg. Aortic valve peak gradient measures 5.0 mmHg. Aortic valve area, by VTI measures 1.84 cm. Pulmonic Valve: The pulmonic valve was not well visualized. Pulmonic valve regurgitation is not visualized. No evidence of pulmonic stenosis. Aorta: The aortic root is normal in size and structure. Pulmonary Artery: Indeterminant PASP, inadequate TR jet. Venous: The inferior vena cava is normal in size with greater than 50% respiratory variability, suggesting right atrial pressure of 3 mmHg. IAS/Shunts: No atrial level shunt detected by color flow Doppler.  LEFT VENTRICLE PLAX 2D LVIDd:         3.41 cm  Diastology LVIDs:          2.51 cm  LV e' medial:    3.37 cm/s LV PW:         1.28 cm  LV E/e' medial:  20.7 LV IVS:        1.45 cm  LV e' lateral:   4.90 cm/s LVOT diam:     1.90 cm  LV E/e' lateral: 14.2 LV SV:         29 LV SV Index:   20 LVOT Area:     2.84 cm  RIGHT VENTRICLE RV S prime:     7.18 cm/s TAPSE (M-mode): 1.7 cm LEFT ATRIUM             Index       RIGHT ATRIUM           Index LA diam:        3.60 cm 2.46 cm/m  RA Area:     13.90 cm LA Vol (A2C):   52.9 ml 36.12 ml/m RA Volume:   41.30 ml  28.20 ml/m LA Vol (A4C):   43.9 ml 29.98 ml/m LA Biplane Vol: 48.1 ml 32.84 ml/m  AORTIC VALVE AV Area (Vmax):    1.83 cm AV Area (Vmean):   1.61 cm AV Area (VTI):     1.84 cm AV Vmax:           111.69 cm/s AV Vmean:          80.592 cm/s AV VTI:            0.159 m AV Peak Grad:      5.0 mmHg AV Mean Grad:      2.8 mmHg LVOT Vmax:         71.98 cm/s LVOT Vmean:        45.672 cm/s LVOT VTI:          0.103 m LVOT/AV VTI ratio: 0.65  AORTA Ao Root diam: 2.90 cm MITRAL VALVE               TRICUSPID VALVE MV Area (PHT): 4.63 cm    TR Peak grad:   37.0 mmHg MV Decel Time: 164 msec    TR Vmax:        304.00 cm/s MV E velocity: 69.80 cm/s MV A velocity: 41.10 cm/s  SHUNTS MV E/A ratio:  1.70        Systemic VTI:  0.10 m                            Systemic Diam: 1.90 cm Carlyle Dolly MD Electronically signed by Carlyle Dolly MD Signature Date/Time: 12/09/2020/12:44:45  PM    Final    CT HEAD CODE STROKE WO CONTRAST`  Addendum Date: 12/13/2020   ADDENDUM REPORT: 12/13/2020 12:25 ADDENDUM: Findings were discussed via telephone with Dr. Rory Percy on 12/13/2020 at 12:10 p.m. CTA and CTP are pending. Electronically Signed   By: Logan Bores M.D.   On: 12/13/2020 12:25   Result Date: 12/13/2020 CLINICAL DATA:  Code stroke. Altered level of consciousness. Weakness. EXAM: CT HEAD WITHOUT CONTRAST TECHNIQUE: Contiguous axial images were obtained from the base of the skull through the vertex without intravenous contrast. COMPARISON:   10/10/2020 FINDINGS: Brain: There is new hypodensity involving the left insula and possibly left operculum. There is no evidence of acute intracranial hemorrhage, mass, midline shift, or extra-axial fluid collection. A small chronic right parietal cortical infarct is again noted. Hypodensities in the cerebral white matter bilaterally are unchanged and nonspecific but compatible with moderate chronic small vessel ischemic disease. A chronic lacunar infarct in the posterior limb of the left internal capsule is unchanged. There is mild cerebral atrophy. Vascular: Prior coiling of an anterior communicating aneurysm. Calcified atherosclerosis at the skull base. No hyperdense vessel. Skull: No fracture or suspicious osseous lesion. Sinuses/Orbits: Visualized paranasal sinuses and mastoid air cells are clear. Leftward gaze. Other: None. ASPECTS [facility] Stroke Program Early CT Score) - Ganglionic level infarction (caudate, lentiform nuclei, internal capsule, insula, M1-M3 cortex): 5-6 - Supraganglionic infarction (M4-M6 cortex): 3 Total score (0-10 with 10 being normal): 8-9 IMPRESSION: 1. Suspected acute left MCA infarct involving the insula and possibly operculum. No hemorrhage. 2. ASPECTS is 8-9. 3. Moderate chronic small vessel ischemic disease. Small chronic right parietal infarct. Attempts to relate these findings to the ordering physician began at 11:38 a.m. and are ongoing. Electronically Signed: By: Logan Bores M.D. On: 12/13/2020 12:01    Orson Eva, DO  [facility]  If 7PM-7AM, please contact night-coverage [email or web address] Password [email or web address] 12/14/2020, 9:25 AM   LOS: 7 days

## 2020-12-14 NOTE — Progress Notes (Signed)
Assisted tele visit to patient with family member.  Duron Meister R, RN  

## 2020-12-14 NOTE — Progress Notes (Signed)
Report called, [facility] onsite to transport patient

## 2020-12-15 DIAGNOSIS — R7989 Other specified abnormal findings of blood chemistry: Secondary | ICD-10-CM

## 2020-12-15 LAB — CBC
HCT: 34.5 % — ABNORMAL LOW (ref 36.0–46.0)
Hemoglobin: 11.5 g/dL — ABNORMAL LOW (ref 12.0–15.0)
MCH: 31 pg (ref 26.0–34.0)
MCHC: 33.3 g/dL (ref 30.0–36.0)
MCV: 93 fL (ref 80.0–100.0)
Platelets: 231 10*3/uL (ref 150–400)
RBC: 3.71 MIL/uL — ABNORMAL LOW (ref 3.87–5.11)
RDW: 13.5 % (ref 11.5–15.5)
WBC: 10.1 10*3/uL (ref 4.0–10.5)
nRBC: 0 % (ref 0.0–0.2)

## 2020-12-15 LAB — BASIC METABOLIC PANEL
Anion gap: 13 (ref 5–15)
BUN: 11 mg/dL (ref 8–23)
CO2: 25 mmol/L (ref 22–32)
Calcium: 8.5 mg/dL — ABNORMAL LOW (ref 8.9–10.3)
Chloride: 103 mmol/L (ref 98–111)
Creatinine, Ser: 1.18 mg/dL — ABNORMAL HIGH (ref 0.44–1.00)
GFR, Estimated: 50 mL/min — ABNORMAL LOW (ref >60)
Glucose, Bld: 97 mg/dL (ref 70–99)
Potassium: 3.5 mmol/L (ref 3.5–5.1)
Sodium: 141 mmol/L (ref 135–145)

## 2020-12-15 LAB — HEPARIN LEVEL (UNFRACTIONATED)
Heparin Unfractionated: 0.48 IU/mL (ref 0.30–0.70)
Heparin Unfractionated: 0.49 IU/mL (ref 0.30–0.70)

## 2020-12-15 LAB — URINE CULTURE: Culture: NO GROWTH

## 2020-12-15 LAB — MAGNESIUM: Magnesium: 1.7 mg/dL (ref 1.7–2.4)

## 2020-12-15 MED ORDER — ASPIRIN 325 MG PO TABS
325.0000 mg | ORAL_TABLET | Freq: Every day | ORAL | Status: DC
  Administered 2020-12-15: 12:00:00 325 mg via ORAL
  Filled 2020-12-15: qty 1

## 2020-12-15 NOTE — Progress Notes (Signed)
[place] for Heparin Indication: LV thrombus  Allergies  Allergen Reactions  . Bee Venom Anaphylaxis and Hives  . Capsicum (Cayenne) [Cayenne] Swelling    Patient Measurements: Height: 5\' 3"  (160 cm) Weight: 45.5 kg (100 lb 4.8 oz) IBW/kg (Calculated) : 52.4 Heparin Dosing Weight: 47.7 kg  Vital Signs: Temp: 98.6 F (37 C) (12/19 0037) Temp Source: Oral (12/19 0037) BP: 117/64 (12/19 0037) Pulse Rate: 76 (12/19 0037)  Labs: Recent Labs    12/13/20 1115 12/13/20 2032 12/14/20 0426 12/14/20 1409 12/15/20 0028  HGB 12.8  --  11.8*  --  11.5*  HCT 40.5  --  37.7  --  34.5*  PLT 256  --  236  --  231  APTT 89*  --   --   --   --   LABPROT 13.2  --   --   --   --   INR 1.0  --   --   --   --   HEPARINUNFRC  --    < > 0.22* 0.21* 0.48  CREATININE 1.17*  --  1.00  1.02*  --  1.18*   < > = values in this interval not displayed.    Estimated Creatinine Clearance: 32.8 mL/min (A) (by C-G formula based on SCr of 1.18 mg/dL (H)).  Assessment: 69 y.o. female with LV thrombus and CVA for heparin  Goal of Therapy:  Heparin level 0.3-0.5 units/ml Monitor platelets by anticoagulation protocol: Yes   Plan:  Continue Heparin at current rate    Phillis Knack, PharmD, BCPS  12/15/2020 1:09 AM

## 2020-12-15 NOTE — Plan of Care (Signed)
  Problem: Coping: Goal: Will identify appropriate support needs Outcome: Progressing

## 2020-12-15 NOTE — Progress Notes (Addendum)
PROGRESS NOTE  Mindy Mills ZOX:[id number] DOB: [birth year] DOA: 12/07/2020 PCP: Patient, No Pcp Per  Brief History   69 y.o. 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  Patient has since had PEG tube and tracheostomy removed.  She reports that for the past 3 days, she has progressive shortness of breath and cough.  She denies any fever.  She denies any vomiting.  She has not felt as though she is been choking after eating or drinking.  She has not had any chest pain.  She feels increasingly weak.  No dysuria, diarrhea.  Since she had progressive shortness of breath, she called EMS for further evaluation   ED Course: Upon arrival to the emergency room, patient is noted to have a mildly elevated creatinine.  Oxygen has been applied since she was noted to be hypoxic.  Oxygen saturations were 88% on room air.  D-dimer was noted to be elevated and CT of the chest was performed that indicated evidence of aspiration pneumonia.  COVID-19 test was found to be negative.  Troponin mildly elevated around 300 and EKG showed T wave inversions in the anterolateral leads.  She was referred for admission.   AM 12/13/20--patient was noted to be dysphasic, not responding to questions nor following commands by RN, and had right hemiparesis.  Code Stroke activated at 1100.  Work up revealed new acute L-MCA territory stroke, likely cardioembolic.  Teleneuro was activated and neurology was consulted.  Heparin drip was continued per stroke protocol dosing.    The patient was transferred to [facility] for continued stroke care on 12/14/2020. Neurology was consulted. EEG is pending.  Consultants  Neurology Cardiology  Procedures  EEG  Antibiotics   Anti-infectives (From admission, onward)    Start     Dose/Rate Route Frequency Ordered Stop   12/15/20 0600  Ampicillin-Sulbactam (UNASYN) 3 g in  sodium chloride 0.9 % 100 mL IVPB        3 g 200 mL/hr over 30 Minutes Intravenous Every 6 hours 12/14/20 2242     12/13/20 1430  Ampicillin-Sulbactam (UNASYN) 3 g in sodium chloride 0.9 % 100 mL IVPB  Status:  Discontinued        3 g 200 mL/hr over 30 Minutes Intravenous Every 6 hours 12/13/20 1343 12/14/20 2242   12/10/20 2300  amoxicillin-clavulanate (AUGMENTIN) 875-125 MG per tablet 1 tablet  Status:  Discontinued        1 tablet Oral Every 12 hours 12/10/20 2128 12/13/20 1343   12/07/20 1800  Ampicillin-Sulbactam (UNASYN) 3 g in sodium chloride 0.9 % 100 mL IVPB  Status:  Discontinued        3 g 200 mL/hr over 30 Minutes Intravenous Every 6 hours 12/07/20 1720 12/10/20 2128   12/07/20 1245  cefTRIAXone (ROCEPHIN) 1 g in sodium chloride 0.9 % 100 mL IVPB        1 g 200 mL/hr over 30 Minutes Intravenous  Once 12/07/20 1239 12/07/20 1413   12/07/20 1245  azithromycin (ZITHROMAX) 500 mg in sodium chloride 0.9 % 250 mL IVPB        500 mg 250 mL/hr over 60 Minutes Intravenous  Once 12/07/20 1239 12/07/20 1531      Subjective  The patient is resting comfortably. No new complaints.  Objective   Vitals:  Vitals:   12/15/20 0918 12/15/20 1002  BP:  125/68  Pulse:  77  Resp:  Temp:    SpO2: 96% 100%   Exam:  Constitutional:  The patient is awake and alert. She does not speak to me. No acute distress. Respiratory:  No increased work of breathing. No wheezes, rales, or rhonchi No tactile fremitus Cardiovascular:  Regular rate and rhythm No murmurs, ectopy, or gallups. No lateral PMI. No thrills. Abdomen:  Abdomen is soft, non-tender, non-distended No hernias, masses, or organomegaly Normoactive bowel sounds.  Musculoskeletal:  No cyanosis, clubbing, or edema Skin:  No rashes, lesions, ulcers palpation of skin: no induration or nodules Neurologic:  3/5 strength in the left upper extremity and 2/5 in the left lower extremity.  Pt has severe expressive  aphasia Psychiatric:  Mental status Mood, affect appropriate Orientation to person, place, time  judgment and insight appear intact  I have personally reviewed the following:   Today's Data  Vitals, BMP, CBC  Imaging  CT head MRI Head  Cardiology Data  Echocardiogram  Scheduled Meds:  arformoterol  15 mcg Nebulization BID   aspirin  325 mg Oral Daily   budesonide (PULMICORT) nebulizer solution  0.5 mg Nebulization BID   feeding supplement  237 mL Oral BID BM   FLUoxetine  80 mg Oral Daily   furosemide  40 mg Oral Daily   guaiFENesin  600 mg Oral BID   ipratropium-albuterol  3 mL Nebulization BID   metoprolol succinate  12.5 mg Oral Daily   Continuous Infusions:  ampicillin-sulbactam (UNASYN) IV 3 g (12/15/20 1156)   heparin 1,000 Units/hr (12/14/20 2319)    Active Problems:   Acute respiratory failure (HCC)   Altered mental status   Aspiration pneumonia (HCC)   Elevated troponin   Acute systolic CHF (congestive heart failure) (HCC)   Emphysema/COPD (HCC)   Protein-calorie malnutrition, severe   Pneumonia of right lower lobe due to infectious organism   Cardioembolic stroke ([place])   LOS: 8 days   A & P  Acute Cardioembolic Stroke: Due to LV thrombus. Pt is on a heparin drip. Neurology has been consulted. She has been transferred to [facility] for ongoing neurological care. On 12/17 CT brain was suspicious for an acute left MCA infarct involving the insula and possibly the operculum. On 12/17 MR brain demonstrated an acute moderate size left MCA territory infarct. Discontiguous small acute infarct of the left caudate. Third small acute infarct of the right cerebellum.12/17 CTA H&N--Left M2 superior division occlusion; Associated left MCA territory infarct without evidence of a significant penumbra on CTP.  Patent carotid and vertebral arteries. HbA1C has is pending, but is unlikely to be positive for DM as the patient's glucoses have been in normal range since admission. Lipid  panel is likewise pending. Speech has recommended a Dysphagia 1 diet. PT/OT eval is pending.   Acute encephalopathy due to CVA: Noted. Improving.   Acute respiratory failure with hypoxia: Secondary to pneumonia and CHF in setting of COPD. The patient is currently saturating 94% on 3L by nasal cannula. Wean off oxygen as tolerated.   Aspiration pneumonia: 12/1 CTA chest--no PE; extensive fluid and debris RLL bronchus. The patient has completed 7 days of IV unasyn. Aspiration is due to dysphagia.   Acute systolic CHF: As of echocardiogram performed on 12/09/2020 the patient's EF is 25-30% with Grade 2 diastolic dysfunction and an apical thrombus. There is also trivial MR. 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 She is receiving PO lasix and IV heparin. Cardiology is following. There are plans for LHC in the  next week if the patient is deemed stable.  COPD: The patient is continuing on duoneb, pulmicort, and brovana. She has incentive spirometry. She has a 50 pack her smoking history.  Elevated troponin: The patient is without chest pain. She does have T wave inversions in the precordial leads. Echocardiogram shows EF of 25-30%. This is possibly due to demand ischemia in the setting of pneumonia or stress induced CM as she has a history of Takotsubo's CM. She is on an aspirin and there are tentative plans for LHC in the next week.    Apical thrombus: Source of cardioembonic CVA. Continue heparin IV.   History of SAH: Noted. 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 She has been seen by neurology and felt reasonable to start on dual antiplatelet therapy versus anticoagulation as needed.   CKD IIIa: Creatinine at baseline. Monitor creatinine, electrolytes, and volume status. Avoid nephrotoxins and hypotension.    Generalized weakness: The patient has been evaluated by physical therapy with recommendations for SNF. The patient is refusing SNF.    Hypokalemia: Resolved. Monitor and supplement as necessary.    Severe Protein Calorie Malnutrition: Nutrition consulted. Continued supplements.   I have seen and examined this patient myself. I have spent 36 minutes in her evaluation and treatment.     DVT Prophylaxis: Heparin gtt CODE STATUS: DNR Family Communication: None available Dispostion:  Status is: Inpatient   Remains inpatient appropriate because:IV treatments appropriate due to intensity of illness or inability to take PO     Dispo: The patient is from: Home              Anticipated d/c is to: SNF if changes mind              Anticipated d/c date is: 2 days              Patient currently is not medically stable to d/c.  Ailyne Pawley, DO [facility] Direct contact: see [email or web address]  7PM-7AM contact night coverage as above 12/15/2020, 4:26 PM  LOS: 8 days

## 2020-12-15 NOTE — Progress Notes (Signed)
Had followed patient initially while at [facility], please see my last rounding note 12/13/20. Was being managed for acute systolic HF LVEF 86-57%, new diagnosis of LV thrombus, pneumonia. Was initiated on medical therapy for her systolic HF and diuresed, started on hep gtt for LV thrombus, and plans were for cath, however patient with acute CVA during admission requiring transfer to [facility]. Being followed closely by neurology, CHF meds held initially due to soft bp's and goals for permissive HTN.   At this time no additional cardiolgy recs. She is not a candidate for cath now, can be reconsidered over several weeks pending recovery from stroke. Would dose low dose toprol and losartan as bp's tolerate, further titration can be pursued as outpatient but overall limited by soft bp's. She has been succesfully diursed, continue oral diuretic. Continue heparin for LV thrombus, transition to coumadin when able from medical standpoint.    We will sign off inpatient care, we will arrange outpatient f/u.   Carlyle Dolly MD

## 2020-12-15 NOTE — Progress Notes (Signed)
  Speech Language Pathology Treatment: Dysphagia  Patient Details Name: Mindy Mills MRN: [id number] DOB: 1951/09/27 Today's Date: 12/15/2020 Time: 3300-7622 SLP Time Calculation (min) (ACUTE ONLY): 11 min  Assessment / Plan / Recommendation Clinical Impression  Follow up from initial assessment 12/17 where she demonstrated + s/s aspiration, impulsivity and placed on Dys 1 and honey thick liquids. RN cleaning pt after self attempts to feed and noted to have food debris dispersed over pt and bed. She attempted to hold utensil in right hand, was unable and needed repeated cues to take utensil with left hand instead of cup pudding. Apparent reduced facial sensation on right needing cues to remove residue. Pudding collected in anterior sulci but able to propel. Had not cleared mouth prior to taking additional bite. Given overall clinical presentation, thinner liquids or higher textures were not administered. Recommend MBS to evaluate current state of oropharyngeal function. Continue honey thick and puree, full assist/supervision with meals, plan for MBS tomorrow   HPI HPI: Mindy Mills is a 69 y.o. female with medical history significant of migraines, intracerebral hemorrhage in 2015. ed. Admitted to [facility] with shortness of breath and cough. Found to have suspected aspiration pneumonia. Chest CT debris filling the right lower lobe bronchus and its branches highly suggestive of acute aspiration; large hiatal hernia. BSE 12/13 rec'd Dys 3/thin. On 12/17 pt noted to have acute aphasia>code stroke. MRI acute moderate left MCA infarct, small acute infarct right cerebellum. Repeat BSE at [facility] recommended Dys 1/honey thick and pt transported to [facility].      SLP Plan  Continue with current plan of care       Recommendations  Diet recommendations: Dysphagia 1 (puree);Honey-thick liquid Liquids provided via: Cup Medication Administration: Whole meds with puree Supervision: Staff to assist with self  feeding;Full supervision/cueing for compensatory strategies;Patient able to self feed Compensations: Small sips/bites;Lingual sweep for clearance of pocketing;Monitor for anterior loss Postural Changes and/or Swallow Maneuvers: Seated upright 90 degrees                Oral Care Recommendations: Oral care BID Follow up Recommendations: Skilled Nursing facility SLP Visit Diagnosis: Dysphagia, unspecified (R13.10) Plan: Continue with current plan of care       GO                Houston Siren 12/15/2020, 3:19 PM  Orbie Pyo Mindy Mills M.Ed Risk analyst [phone] Office [phone]

## 2020-12-15 NOTE — Progress Notes (Signed)
ANTICOAGULATION CONSULT NOTE - Follow Up Consult  Pharmacy Consult for Heparin Indication: cardiac thrombus and stroke  Allergies  Allergen Reactions  . Bee Venom Anaphylaxis and Hives  . Capsicum (Cayenne) [Cayenne] Swelling    Patient Measurements: Height: 5\' 3"  (160 cm) Weight: 45 kg (99 lb 3.3 oz) IBW/kg (Calculated) : 52.4 Heparin Dosing Weight: 47.7 kg  Vital Signs: Temp: 99.4 F (37.4 C) (12/19 0444) Temp Source: Oral (12/19 0444) BP: 127/73 (12/19 0444) Pulse Rate: 76 (12/19 0444)  Labs: Recent Labs    12/13/20 1115 12/13/20 2032 12/14/20 0426 12/14/20 1409 12/15/20 0028 12/15/20 0808  HGB 12.8  --  11.8*  --  11.5*  --   HCT 40.5  --  37.7  --  34.5*  --   PLT 256  --  236  --  231  --   APTT 89*  --   --   --   --   --   LABPROT 13.2  --   --   --   --   --   INR 1.0  --   --   --   --   --   HEPARINUNFRC  --    < > 0.22* 0.21* 0.48 0.49  CREATININE 1.17*  --  1.00  1.02*  --  1.18*  --    < > = values in this interval not displayed.    Estimated Creatinine Clearance: 32.4 mL/min (A) (by C-G formula based on SCr of 1.18 mg/dL (H)).  Assessment:  69 yr old female started on IV heparin on 12/10/20 for apical thrombus. Prior hx SAH, cleared for anticoagulation by Neuro.  Possible LHC/RHC later this admission.  New CVA on 12/18, now using low therapeutic goal for heparin dosing.  Confirmatory heparin level is therapeutic at 0.49. CBC stable.  Goal of Therapy:  Heparin level 0.3-0.5 units/ml Monitor platelets by anticoagulation protocol: Yes   Plan:  Continue heparin infusion to 1000 units/hr  Daily heparin level, CBC  Fara Olden, PharmD PGY-1 Pharmacy Resident 12/15/2020 9:27 AM Please see AMION for all pharmacy numbers

## 2020-12-15 NOTE — Progress Notes (Signed)
PT Cancellation Note  Patient Details Name: Mindy Mills MRN: [id number] DOB: 1951-09-06   Cancelled Treatment:    Reason Eval/Treat Not Completed: Other (comment). PT entered room with pt lying in bed attempting to eat chocolate ice cream with her hands. PT assisted pt with using a plastic spoon to eat a few bites of ice cream. Then pt indicated that she wanted to use the spoon herself; however, she continuously tried to use the spoon backwards, attempting to scoop the ice cream with the handle even though PT showed her multiple times that the spoon was already loaded with a bite of food. Pt was very perseverative on eating and not following any PT commands at this time. PT will continue to f/u with pt acutely to progress mobility as tolerated.    [place] 12/15/2020, 2:51 PM

## 2020-12-15 NOTE — Progress Notes (Signed)
STROKE TEAM PROGRESS NOTE   HISTORY OF PRESENT ILLNESS (per record) Mindy Mills is a 69 y.o. female PMHx migraine, COPD,CHF, aneurysmal SAH s/p Acomcoiling 2015 admitted for acute hypoxic respiratory failure found to have aspiration pneumonia and apical thrombus on heparin drip on 0900 on 12/13/2020 noted inability to speak, difficulty walking and looking confused ~1031  on 12/13/2020. Code stroke activated showing acute left MCA infarct involving the insula and possibly the operculum and transferred to [facility] tpa given?: No, [id number] IR Thrombectomy? No, lvo Modified Rankin Scale: functional status not known NIHSS: 15   INTERVAL HISTORY Her family is not at the bedside. Frail, dysarthric. Known apical thrombus, LV 25-30% From a stroke perspective moderate-sized stroke would wait at least 5-7 days prior to coumadin transition, Stroke team to weigh in on Monday    OBJECTIVE Vitals:   12/14/20 2030 12/14/20 2132 12/15/20 0037 12/15/20 0444  BP:  121/63 117/64 127/73  Pulse:  79 76 76  Resp:  16 16 16   Temp:  98.7 F (37.1 C) 98.6 F (37 C) 99.4 F (37.4 C)  TempSrc:  Oral Oral Oral  SpO2: 94% 92% 96% 95%  Weight:    45 kg  Height:        CBC:  Recent Labs  Lab 12/14/20 0426 12/15/20 0028  WBC 9.5 10.1  HGB 11.8* 11.5*  HCT 37.7 34.5*  MCV 99.2 93.0  PLT 236 287    Basic Metabolic Panel:  Recent Labs  Lab 12/14/20 0426 12/15/20 0028  NA 142 141  K 3.7 3.5  CL 107 103  CO2 27 25  GLUCOSE 82 97  BUN 12 11  CREATININE 1.00  1.02* 1.18*  CALCIUM 8.5* 8.5*  MG 2.1 1.7    Lipid Panel:     Component Value Date/Time   CHOL 159 09/09/2015 0532   TRIG 193 (H) 12/07/2020 0953   HDL 52 09/09/2015 0532   CHOLHDL 3.1 09/09/2015 0532   VLDL 18 09/09/2015 0532   LDLCALC 89 09/09/2015 0532   HgbA1c:  Lab Results  Component Value Date   HGBA1C 5.7 (H) 09/09/2015   Urine Drug Screen:     Component Value Date/Time   LABOPIA NONE DETECTED 09/15/2014 2208    COCAINSCRNUR NONE DETECTED 09/15/2014 2208   LABBENZ NONE DETECTED 09/15/2014 2208   AMPHETMU NONE DETECTED 09/15/2014 2208   THCU POSITIVE (A) 09/15/2014 2208   LABBARB NONE DETECTED 09/15/2014 2208    Alcohol Level     Component Value Date/Time   ETH <10 08/31/2018 1632    IMAGING  CT Code Stroke CTA Head W/WO contrast CT Code Stroke CTA Neck W/WO contrast CT Code Stroke Cerebral Perfusion with contrast 12/13/2020 IMPRESSION:  1. Left M2 superior division occlusion.  2. Associated left MCA territory infarct without evidence of a significant penumbra on CTP.  3. Patent carotid and vertebral arteries. Loop in the proximal left ICA with focal kinking versus a small web.  4. Previous anterior communicating aneurysm coiling.  5. Aortic Atherosclerosis (ICD10-I70.0) and Emphysema (ICD10-J43.9).   CT HEAD WO CONTRAST 12/14/2020 IMPRESSION:  1. The known left frontal infarct is again identified. The overall size is similar in the interval. No gross hemorrhagic transformation. Possible early petechial blood not excluded.  2. No other acute abnormalities.   CT HEAD WO CONTRAST 12/13/2020 IMPRESSION:  1. Loss of gray-white differentiation affecting the left frontal operculum consistent with the region of acute infarction shown by MRI. Overall, this measures approximately 5-6 cm in size.  No gross hemorrhagic transformation. Possible early petechial blood hyperdensity in the anterior portion of the infarction. No mass effect or shift.  2. 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  3. Chronic small-vessel ischemic changes of the white matter. Old right frontal cortical and subcortical infarction.   MR BRAIN WO CONTRAST 12/13/2020 IMPRESSION:  Acute moderate size left MCA territory infarct. Discontiguous small acute infarct of the left caudate. Third small acute infarct of the right cerebellum.   EEG adult 12/14/2020 ABNORMALITY -Continued slow, left  hemisphere, maximal left frontal region  IMPRESSION:  This study is suggestive of cortical dysfunction in left hemisphere, maximal left frontal region secondary to underlying structural abnormality/stroke. No seizures or definite epileptiform discharges were seen throughout the recording.   CT HEAD CODE STROKE WO CONTRAST 12/13/2020 IMPRESSION:  1. Suspected acute left MCA infarct involving the insula and possibly operculum. No hemorrhage.  2. ASPECTS is 8-9. 3. Moderate chronic small vessel ischemic disease. Small chronic right parietal infarct.   Transthoracic Echocardiogram  12/09/2020 IMPRESSIONS  1. The mid to distal anteroseptal,anterior and anterolateral walls are  hypokinetic. The apex is hypokinetic. There is a 0.7 x 1.3 cm mural apical  thrombus. . Left ventricular ejection fraction, by estimation, is 25 to  30%. The left ventricle has severely decreased function. The left ventricle demonstrates regional wall  motion abnormalities (see scoring diagram/findings for description). There  is moderate left ventricular hypertrophy. Left ventricular diastolic  parameters are consistent with  Grade II diastolic dysfunction (pseudonormalization). Elevated left atrial  pressure.  2. Right ventricular systolic function is normal. The right ventricular  size is normal.  3. Left atrial size was mildly dilated.  4. The mitral valve is normal in structure. Trivial mitral valve  regurgitation. No evidence of mitral stenosis. Moderate mitral annular  calcification.  5. The aortic valve was not well visualized. There is mild calcification  of the aortic valve. There is mild thickening of the aortic valve. Aortic  valve regurgitation is not visualized. No aortic stenosis is present.  6. The inferior vena cava is normal in size with greater than 50%  respiratory variability, suggesting right atrial pressure of 3 mmHg.   ECG - SR rate 77 BPM. (See cardiology reading for complete  details)   PHYSICAL EXAM Blood pressure 127/73, pulse 76, temperature 99.4 F (37.4 C), temperature source Oral, resp. rate 16, height 5\' 3"  (1.6 m), weight 45 kg, SpO2 95 %.   Frail elderly female, NAD, normocephalic. awake and alert, nods when asked questions apears appropriate(nods yes to more applesauce) , follows simple commands, oriented to her name. Blinks to threat bilat, PERRL, EOMI, facial NL flattening and left lower weakness, hearing impaired, tongue midline,  toes down going. Can hold up arms to gravity without drift, will not follow commands on lower extremities but w/d to stim x 4.     ASSESSMENT/PLAN Mindy Mills is a 69 y.o. 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 She did not receive IV t-PA due to IV Heparin therapy - PTT 89.  Stroke: Acute moderate size left MCA territory infarct. Discontiguous small acute infarct of the left caudate. Third small acute infarct of the right cerebellum - embolic - likely 2/2 known apical thrombus LV EF 25-30%   Code Stroke CT Head - Suspected acute left MCA infarct involving the insula and possibly operculum. No hemorrhage. ASPECTS is 8-9. Moderate chronic small vessel ischemic disease. Small chronic right parietal infarct.     CT head - The known left frontal infarct is again identified. The overall size is similar in the interval. No gross hemorrhagic transformation. Possible  early petechial blood not excluded.   MRI head - Acute moderate size left MCA territory infarct. Discontiguous small acute infarct of the left caudate. Third small acute infarct of the right cerebellum.   MRA head - not ordered  CTA H&N - Left M2 superior division occlusion. Patent carotid and vertebral arteries. Loop in the proximal left ICA with focal kinking versus a small web. Previous anterior communicating aneurysm coiling.  CT Perfusion - Associated left MCA territory infarct without evidence of a significant penumbra on CTP.   Carotid Doppler - CTA neck ordered - carotid dopplers not indicated.  EEG - This study is suggestive of cortical dysfunction in left hemisphere, maximal left frontal region secondary to underlying structural abnormality/stroke. No seizures or definite epileptiform discharges were seen.  2D Echo - 12/09/20 -  There is a 0.7 x 1.3 cm mural apical thrombus. . Left ventricular ejection fraction, by estimation, is 25 to 30%.   Sars Corona Virus 2 - negative  LDL - pending  HgbA1c - pending  UDS - not ordered  VTE prophylaxis - Heparin IV Diet  Diet Order            DIET - DYS 1 Room service appropriate? Yes; Fluid consistency: Honey Thick  Diet effective now                  heparin IV (aspirin 325 mg daily PTA - [facility]) prior to admission, now on aspirin 300 mg suppository daily and heparin IV. Continue heparin for LV thrombus, on IV heparin, will be transitioned to coumadin. From a stroke perspective moderate-sized stroke would wait at least 5-7 days before anticoagulating with coumadin, team to weigh in on Monday  Patient will be counseled to be compliant with her antithrombotic medications  Ongoing aggressive stroke risk factor management  Therapy recommendations:  pending  Disposition:  Pending  Hypertension  Home BP meds: none   Current BP meds: metoprolol  Stable . Permissive hypertension (avoid extreme htn with low EF) but gradually  normalize in 5-7 days . Long-term BP goal normotensive  Hyperlipidemia  Home Lipid lowering medication: Crestor 40 mg daily (pt was not taking)  LDL pending, goal < 70  Current lipid lowering medication: none   Continue statin at discharge  Other Stroke Risk Factors  Advanced age  Former cigarette smoker - quit > 5 years ago  Previous ETOH use  Hx stroke/TIA  Coronary artery disease  Migraines  Congestive Heart Failure - EF 25 - 30%  Known apical thrombus  Other Active Problems, Findings and Recommendations  Code status - DNR  Aspiration pneumonia -> Unasyn (blood cultures NG x 5 days) (urine culture pending)  Aortic Atherosclerosis (ICD10-I70.0)  Emphysema (ICD10-J43.9)  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  I have personally obtained the history, evaluated lab date, reviewed imaging studies and agree with radiology interpretations.    Sarina Ill, MD Stroke Neurology  I spent 25 minutes of face-to-face and non-face-to-face time with patient. This included prechart review, lab review, study review, order entry, electronic health record documentation, patient education on the different diagnostic and therapeutic options, counseling and coordination of care, risks and benefits of management, compliance, or risk factor reduction   To contact Stroke Continuity provider, please refer to [email or web address] After hours, contact General Neurology

## 2020-12-15 NOTE — Evaluation (Signed)
Speech Language Pathology Evaluation Patient Details Name: Mindy Mills MRN: [id number] DOB: 1951/07/12 Today's Date: 12/15/2020 Time: 7510-2585 SLP Time Calculation (min) (ACUTE ONLY): 13 min  Problem List:  Patient Active Problem List   Diagnosis Date Noted  . Cardioembolic stroke ([place]) 27/78/2423  . Protein-calorie malnutrition, severe 12/12/2020  . Pneumonia of right lower lobe due to infectious organism   . Acute systolic CHF (congestive heart failure) ([place]) 12/09/2020  . Emphysema/COPD ([place]) 12/09/2020  . Aspiration pneumonia ([place]) 12/07/2020  . Elevated troponin 12/07/2020  . Closed fracture of right distal radius 09/07/2018  . Fall 09/08/2015  . UTI (lower urinary tract infection) 09/08/2015  . Dehydration 09/08/2015  . Sepsis ([place]) 09/08/2015  . Systolic heart failure ([place]) 09/08/2015  . Confusion 09/08/2015  . TIA (transient ischemic attack) 09/08/2015  . Acute on chronic respiratory failure ([place]) 09/24/2014  . Tracheostomy status ([name]) 09/22/2014  . Demand ischemia of myocardium: in setting of CVA 09/17/2014  . 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 Subarachnoid hemorrhage due to ruptured aneurysm ([place]) 09/11/2014  . Acute respiratory failure ([place]) 09/11/2014  . Hypokalemia 09/11/2014  . Altered mental status 09/11/2014  . Subarachnoid hemorrhage ([place]) 09/11/2014   Past Medical History:  Past Medical History:  Diagnosis Date  . 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 Full dentures   . Hepatitis C    denies  . Hypertension   . Migraine   . Myocardial infarction ([place])   . Stroke [facility]) 2015   Pt reports hx of CVA x3.  Last one in 2015  . Wears glasses   . Wears hearing aid in both ears    Past Surgical History:  Past Surgical History:  Procedure Laterality Date  . ABDOMINAL HYSTERECTOMY    . BUNIONECTOMY    . COLONOSCOPY    . MULTIPLE TOOTH EXTRACTIONS    . OPEN REDUCTION INTERNAL FIXATION  (ORIF) DISTAL RADIAL FRACTURE Right 09/07/2018   Procedure: OPEN REDUCTION INTERNAL FIXATION (ORIF) DISTAL RADIAL FRACTURE;  Surgeon: Iran Planas, MD;  Location: [place];  Service: Orthopedics;  Laterality: Right;  . PEG PLACEMENT N/A 09/21/2014   Procedure: PERCUTANEOUS ENDOSCOPIC GASTROSTOMY (PEG) PLACEMENT (BED SIDE);  Surgeon: Georganna Skeans, MD;  Location: [place];  Service: General;  Laterality: N/A;  . RADIOLOGY WITH ANESTHESIA N/A 09/11/2014   Procedure: Sub Arachnoid Aneurysm Coiling -RADIOLOGY WITH ANESTHESIA ;  Surgeon: Consuella Lose, MD;  Location: [place];  Service: Radiology;  Laterality: N/A;   HPI:  Mindy Mills is a 69 y.o. female with medical history significant of migraines, intracerebral hemorrhage in 2015. ed. Admitted to [facility] with shortness of breath and cough. Found to have suspected aspiration pneumonia. Chest CT debris filling the right lower lobe bronchus and its branches highly suggestive of acute aspiration; large hiatal hernia. BSE 12/13 rec'd Dys 3/thin. On 12/17 pt noted to have acute aphasia>code stroke. MRI acute moderate left MCA infarct, small acute infarct right cerebellum. Repeat BSE at [facility] recommended Dys 1/honey thick and pt transported to [facility].   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 She could not imitate phonation via visual assist or cues with familiar melodies. Partially opened mouth but unable to protrude tongue. Spontaneously there was one phrase composed of neologims and nothing discernable. She followed one step commands with 50% accuracy, yes/no responses to biographical and environmental questions  were 30%.Written information ("yes/no") without significant improvements. Continued ST on acute to initially assist pt in expressing needs.    SLP Assessment  SLP Recommendation/Assessment: Patient needs continued Speech  [place] [facility] SLP Visit Diagnosis: Aphasia (R47.01);Apraxia (R48.2);Cognitive communication deficit (R41.841)    Follow Up Recommendations  Inpatient Rehab    Frequency and Duration min 2x/week  2 weeks      SLP Evaluation Cognition  Overall Cognitive Status: Impaired/Different from baseline Arousal/Alertness: Awake/alert Orientation Level: Disoriented to person (via yes/no head nods) Attention: Sustained Sustained Attention: Appears intact Memory:  (TBA) Awareness: Impaired Awareness Impairment: Emergent impairment Problem Solving: Impaired Problem Solving Impairment: Functional basic Safety/Judgment: Impaired       Comprehension  Auditory Comprehension Overall Auditory Comprehension: Impaired Yes/No Questions: Impaired Basic Biographical Questions: 26-50% accurate Basic Immediate Environment Questions: 25-49% accurate Commands: Impaired One Step Basic Commands: 25-49% accurate (50%) Conversation: Simple Visual Recognition/Discrimination Discrimination: Not tested Reading Comprehension Reading Status:  (single words)    Expression Expression Primary Mode of Expression: Nonverbal - gestures Verbal Expression Overall Verbal Expression: Impaired Initiation: Impaired Repetition: Impaired Level of Impairment:  (cannot imitate sounds) Naming:  (no response) Pragmatics: No impairment Written Expression Dominant Hand: Right Written Expression:  (TBA)   Oral / Motor  Oral Motor/Sensory Function Overall Oral Motor/Sensory Function: Mild impairment Facial ROM: Reduced right;Suspected CN VII (facial) dysfunction Facial Symmetry: Abnormal symmetry right;Suspected CN VII (facial) dysfunction Facial Strength:  (some oral apraxia) Facial Sensation: Reduced right;Suspected CN V (Trigeminal) dysfunction Lingual ROM:  (was unable) Lingual Symmetry:  (unable to protrude) Motor Speech Overall Motor Speech:  (to be assessed)   GO                    Houston Siren 12/15/2020, 4:34 PM Orbie Pyo Colvin Caroli.Ed Risk analyst [phone] Office [phone]

## 2020-12-16 ENCOUNTER — Inpatient Hospital Stay (HOSPITAL_COMMUNITY): Payer: Medicare Other

## 2020-12-16 LAB — BASIC METABOLIC PANEL
Anion gap: 12 (ref 5–15)
BUN: 13 mg/dL (ref 8–23)
CO2: 27 mmol/L (ref 22–32)
Calcium: 8.6 mg/dL — ABNORMAL LOW (ref 8.9–10.3)
Chloride: 102 mmol/L (ref 98–111)
Creatinine, Ser: 1.02 mg/dL — ABNORMAL HIGH (ref 0.44–1.00)
GFR, Estimated: 60 mL/min — ABNORMAL LOW (ref >60)
Glucose, Bld: 107 mg/dL — ABNORMAL HIGH (ref 70–99)
Potassium: 3.4 mmol/L — ABNORMAL LOW (ref 3.5–5.1)
Sodium: 141 mmol/L (ref 135–145)

## 2020-12-16 LAB — LIPID PANEL
Cholesterol: 199 mg/dL (ref 0–200)
HDL: 44 mg/dL (ref >40)
LDL Cholesterol: 137 mg/dL — ABNORMAL HIGH (ref 0–99)
Total CHOL/HDL Ratio: 4.5 RATIO
Triglycerides: 91 mg/dL (ref <150)
VLDL: 18 mg/dL (ref 0–40)

## 2020-12-16 LAB — HEPARIN LEVEL (UNFRACTIONATED): Heparin Unfractionated: 0.53 IU/mL (ref 0.30–0.70)

## 2020-12-16 LAB — CBC
HCT: 36 % (ref 36.0–46.0)
Hemoglobin: 11.5 g/dL — ABNORMAL LOW (ref 12.0–15.0)
MCH: 30.1 pg (ref 26.0–34.0)
MCHC: 31.9 g/dL (ref 30.0–36.0)
MCV: 94.2 fL (ref 80.0–100.0)
Platelets: 244 10*3/uL (ref 150–400)
RBC: 3.82 MIL/uL — ABNORMAL LOW (ref 3.87–5.11)
RDW: 13.6 % (ref 11.5–15.5)
WBC: 10.8 10*3/uL — ABNORMAL HIGH (ref 4.0–10.5)
nRBC: 0 % (ref 0.0–0.2)

## 2020-12-16 LAB — HEMOGLOBIN A1C
Hgb A1c MFr Bld: 5.2 % (ref 4.8–5.6)
Mean Plasma Glucose: 102.54 mg/dL

## 2020-12-16 MED ORDER — RESOURCE THICKENUP CLEAR PO POWD
ORAL | Status: DC | PRN
  Filled 2020-12-16: qty 125

## 2020-12-16 MED ORDER — COUMADIN BOOK
Freq: Once | Status: DC
  Filled 2020-12-16: qty 1

## 2020-12-16 MED ORDER — WARFARIN SODIUM 2.5 MG PO TABS
2.5000 mg | ORAL_TABLET | Freq: Once | ORAL | Status: AC
  Administered 2020-12-16: 17:00:00 2.5 mg via ORAL
  Filled 2020-12-16: qty 1

## 2020-12-16 MED ORDER — WARFARIN - PHARMACIST DOSING INPATIENT: Freq: Every day | Status: DC

## 2020-12-16 NOTE — Plan of Care (Signed)
  Problem: Clinical Measurements: Goal: Will remain free from infection Outcome: Not Progressing Goal: Diagnostic test results will improve Outcome: Not Progressing Goal: Respiratory complications will improve Outcome: Not Progressing Goal: Cardiovascular complication will be avoided Outcome: Progressing   Problem: Activity: Goal: Risk for activity intolerance will decrease Outcome: Progressing   Problem: Elimination: Goal: Will not experience complications related to bowel motility Outcome: Progressing Goal: Will not experience complications related to urinary retention Outcome: Progressing   Problem: Safety: Goal: Ability to remain free from injury will improve Outcome: Progressing   Problem: Skin Integrity: Goal: Risk for impaired skin integrity will decrease Outcome: Progressing

## 2020-12-16 NOTE — Care Management Important Message (Signed)
Important Message  Patient Details  Name: Mindy Mills MRN: [id number] Date of Birth: 1951-08-31   Medicare Important Message Given:  Yes     Shelda Altes 12/16/2020, 12:16 PM

## 2020-12-16 NOTE — Progress Notes (Signed)
STROKE TEAM PROGRESS NOTE      INTERVAL HISTORY Her family is not at the bedside.  Patient is lying comfortably in bed.  Neurological exam is stable.  Vital signs are stable.  Blood pressure adequately controlled.  CTA head from 12/14/2020 shows stable left MCA infarct without significant hemorrhagic transformation.   OBJECTIVE Vitals:   12/16/20 0350 12/16/20 0455 12/16/20 0801 12/16/20 1735  BP: (!) 110/43 (!) 121/56 (!) 124/56 103/69  Pulse: 65 61 66 66  Resp:  16 16 18   Temp: 99.4 F (37.4 C) 98.4 F (36.9 C) 99.5 F (37.5 C) 99.5 F (37.5 C)  TempSrc: Oral Oral Oral Oral  SpO2: 92% 97% 98% 95%  Weight:  45.5 kg    Height:        CBC:  Recent Labs  Lab 12/15/20 0028 12/16/20 0334  WBC 10.1 10.8*  HGB 11.5* 11.5*  HCT 34.5* 36.0  MCV 93.0 94.2  PLT 231 505    Basic Metabolic Panel:  Recent Labs  Lab 12/14/20 0426 12/15/20 0028 12/16/20 0334  NA 142 141 141  K 3.7 3.5 3.4*  CL 107 103 102  CO2 27 25 27   GLUCOSE 82 97 107*  BUN 12 11 13   CREATININE 1.00  1.02* 1.18* 1.02*  CALCIUM 8.5* 8.5* 8.6*  MG 2.1 1.7  --     Lipid Panel:     Component Value Date/Time   CHOL 199 12/16/2020 0334   TRIG 91 12/16/2020 0334   HDL 44 12/16/2020 0334   CHOLHDL 4.5 12/16/2020 0334   VLDL 18 12/16/2020 0334   LDLCALC 137 (H) 12/16/2020 0334   HgbA1c:  Lab Results  Component Value Date   HGBA1C 5.2 12/16/2020   Urine Drug Screen:     Component Value Date/Time   LABOPIA NONE DETECTED 09/15/2014 2208   COCAINSCRNUR NONE DETECTED 09/15/2014 2208   LABBENZ NONE DETECTED 09/15/2014 2208   AMPHETMU NONE DETECTED 09/15/2014 2208   THCU POSITIVE (A) 09/15/2014 2208   LABBARB NONE DETECTED 09/15/2014 2208    Alcohol Level     Component Value Date/Time   ETH <10 08/31/2018 1632    IMAGING  CT Code Stroke CTA Head W/WO contrast CT Code Stroke CTA Neck W/WO contrast CT Code Stroke Cerebral Perfusion with contrast 12/13/2020 IMPRESSION:  1. Left M2  superior division occlusion.  2. Associated left MCA territory infarct without evidence of a significant penumbra on CTP.  3. Patent carotid and vertebral arteries. Loop in the proximal left ICA with focal kinking versus a small web.  4. Previous anterior communicating aneurysm coiling.  5. Aortic Atherosclerosis (ICD10-I70.0) and Emphysema (ICD10-J43.9).   CT HEAD WO CONTRAST 12/14/2020 IMPRESSION:  1. The known left frontal infarct is again identified. The overall size is similar in the interval. No gross hemorrhagic transformation. Possible early petechial blood not excluded.  2. No other acute abnormalities.   CT HEAD WO CONTRAST 12/13/2020 IMPRESSION:  1. Loss of gray-white differentiation affecting the left frontal operculum consistent with the region of acute infarction shown by MRI. Overall, this measures approximately 5-6 cm in size. No gross hemorrhagic transformation. Possible early petechial blood hyperdensity in the anterior portion of the infarction. No mass effect or shift.  2. 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  3. Chronic small-vessel ischemic changes of the white matter. Old right frontal cortical and subcortical infarction.   MR BRAIN WO CONTRAST 12/13/2020 IMPRESSION:  Acute moderate size left MCA territory infarct.  Discontiguous small acute infarct of the left caudate. Third small acute infarct of the right cerebellum.   EEG adult 12/14/2020 ABNORMALITY -Continued slow, left hemisphere, maximal left frontal region  IMPRESSION:  This study is suggestive of cortical dysfunction in left hemisphere, maximal left frontal region secondary to underlying structural abnormality/stroke. No seizures or definite epileptiform discharges were seen throughout the recording.   CT HEAD CODE STROKE WO CONTRAST 12/13/2020 IMPRESSION:  1. Suspected acute left MCA infarct involving the insula and possibly operculum. No hemorrhage.  2. ASPECTS is 8-9.  3. Moderate chronic small vessel ischemic disease. Small chronic right parietal infarct.   Transthoracic Echocardiogram  12/09/2020 IMPRESSIONS  1. The mid to distal anteroseptal,anterior and anterolateral walls are  hypokinetic. The apex is hypokinetic. There is a 0.7 x 1.3 cm mural apical  thrombus. . Left ventricular ejection fraction, by estimation, is 25 to  30%. The left ventricle has severely decreased function. The left ventricle demonstrates regional wall  motion abnormalities (see scoring diagram/findings for description). There  is moderate left ventricular hypertrophy. Left ventricular diastolic  parameters are consistent with  Grade II diastolic dysfunction (pseudonormalization). Elevated left atrial  pressure.  2. Right ventricular systolic function is normal. The right ventricular  size is normal.  3. Left atrial size was mildly dilated.  4. The mitral valve is normal in structure. Trivial mitral valve  regurgitation. No evidence of mitral stenosis. Moderate mitral annular  calcification.  5. The aortic valve was not well visualized. There is mild calcification  of the aortic valve. There is mild thickening of the aortic valve. Aortic  valve regurgitation is not visualized. No aortic stenosis is present.  6. The inferior vena cava is normal in size with greater than 50%  respiratory variability, suggesting right atrial pressure of 3 mmHg.   ECG - SR rate 77 BPM. (See cardiology reading for complete details)   PHYSICAL EXAM Blood pressure 103/69, pulse 66, temperature 99.5 F (37.5 C), temperature source Oral, resp. rate 18, height 5\' 3"  (1.6 m), weight 45.5 kg, SpO2 95 %.   Frail elderly female, NAD, normocephalic. . Afebrile. Head is nontraumatic. Neck is supple without bruit.    Cardiac exam no murmur or gallop. Lungs are clear to auscultation. Distal pulses are well felt.  Neurological Exam : awake and alert, nods when asked questions apears appropriate(nods  yes to more applesauce) , follows simple commands, oriented to her name. Blinks to threat bilat, PERRL, EOMI, facial NL flattening and left lower weakness, hearing impaired, tongue midline,  toes down going. Can hold up arms to gravity without drift, will not follow commands on lower extremities but w/d to stim x 4.     ASSESSMENT/PLAN Mindy Mills is a 69 y.o. 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 She did not receive IV t-PA due to IV Heparin therapy - PTT 89.  Stroke: Acute moderate size left MCA territory infarct. Discontiguous small acute infarct of the left caudate. Third small acute  infarct of the right cerebellum - embolic - likely 2/2 known apical thrombus LV EF 25-30%   Code Stroke CT Head - Suspected acute left MCA infarct involving the insula and possibly operculum. No hemorrhage. ASPECTS is 8-9. Moderate chronic small vessel ischemic disease. Small chronic right parietal infarct.     CT head - The known left frontal infarct is again identified. The overall size is similar in the interval. No gross hemorrhagic transformation. Possible early petechial blood not excluded.   MRI head - Acute moderate size left MCA territory infarct. Discontiguous small acute infarct of the left caudate. Third small acute infarct of the right cerebellum.   MRA head - not ordered  CTA H&N - Left M2 superior division occlusion. Patent carotid  and vertebral arteries. Loop in the proximal left ICA with focal kinking versus a small web. Previous anterior communicating aneurysm coiling.  CT Perfusion - Associated left MCA territory infarct without evidence of a significant penumbra on CTP.   Carotid Doppler - CTA neck ordered - carotid dopplers not indicated.  EEG - This study is suggestive of cortical dysfunction in left hemisphere, maximal left frontal region secondary to underlying structural abnormality/stroke. No seizures or definite epileptiform discharges were seen.  2D Echo - 12/09/20 -  There is a 0.7 x 1.3 cm mural apical thrombus. . Left ventricular ejection fraction, by estimation, is 25 to 30%.   Sars Corona Virus 2 - negative  LDL -137  HgbA1c - 5.2  UDS - not ordered  VTE prophylaxis - Heparin IV Diet  Diet Order            DIET - DYS 1 Room service appropriate? Yes; Fluid consistency: Honey Thick  Diet effective now                 heparin IV (aspirin 325 mg daily PTA - [facility]) prior to admission, now on aspirin 300 mg suppository daily and heparin IV. Continue heparin for LV thrombus, on IV heparin, will be transitioned to coumadin. From a stroke perspective moderate-sized stroke would wait at least 5-7 days before anticoagulating with coumadin, team to weigh in on Monday  Patient will be counseled to be compliant with her antithrombotic medications  Ongoing aggressive stroke risk factor management  Therapy recommendations:  CLR  Disposition:  Pending  Hypertension  Home BP meds: none   Current BP meds: metoprolol  Stable . Permissive hypertension (avoid extreme htn with low EF) but gradually normalize in 5-7 days . Long-term BP goal normotensive  Hyperlipidemia  Home Lipid lowering medication: Crestor 40 mg daily (pt was not taking)  LDL pending, goal < 70  Current lipid lowering medication: none   Continue statin at discharge  Other Stroke Risk Factors  Advanced age  Former  cigarette smoker - quit > 5 years ago  Previous ETOH use  Hx stroke/TIA  Coronary artery disease  Migraines  Congestive Heart Failure - EF 25 - 30%  Known apical thrombus  Other Active Problems, Findings and Recommendations  Code status - DNR  Aspiration pneumonia -> Unasyn (blood cultures NG x 5 days) (urine culture pending)  Aortic Atherosclerosis (ICD10-I70.0)  Emphysema (ICD10-J43.9)  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  Recommend start IV heparin stroke protocol bridge and warfarin for  stroke prevention given left ventricular mural clot and high risk for recurrent stroke.  No family available for discussion.  Discussed with Dr. Benny Lennert    I spent 35 minutes of face-to-face and non-face-to-face time with patient. This included prechart review, lab review, study review, order entry, electronic health record documentation, patient education on the different diagnostic and therapeutic options, counseling and coordination of care, risks and benefits of management, compliance, or risk factor reduction Antony Contras, MD  To contact Stroke Continuity provider, please refer to [email or web address] After hours, contact General Neurology

## 2020-12-16 NOTE — Progress Notes (Signed)
Modified Barium Swallow Progress Note  Patient Details  Name: Mindy Mills MRN: [id number] Date of Birth: [birth year]  Today's Date: 12/16/2020  Modified Barium Swallow completed.  Full report located under Chart Review in the Imaging Section.  Brief recommendations include the following:  Clinical Impression  Pt presents with a moderate oral more than pharyngeal dysphagia, with impaired timing and coordination primarily impacting safety with swallowing. She has slow oral transit with reduced bolus cohesion, premature spillage to the valleculae and pyriform sinuses, and oral residue. She will intermittently perform a second, spontaneous swallow to reduce oral residue. Impaired timing, as well as reduced hyolaryngeal movement and laryngeal closure, result in penetration and aspiration with nectar thick and thin liquids, respectively. Pt had minimal coughing during testing, coughing x1 before any airway invasion had occurred, and again x1 after aspiration but this was ineffective. Recommend continuing Dys 1 diet and honey thick liquids with good potential for upgrade as oral control improves.   Swallow Evaluation Recommendations       SLP Diet Recommendations: Dysphagia 1 (Puree) solids;Honey thick liquids   Liquid Administration via: Cup   Medication Administration: Crushed with puree   Supervision: Patient able to self feed;Full supervision/cueing for compensatory strategies   Compensations: Small sips/bites;Lingual sweep for clearance of pocketing;Monitor for anterior loss   Postural Changes: Seated upright at 90 degrees;Remain semi-upright after after feeds/meals (Comment)   Oral Care Recommendations: Oral care before and after PO;Oral care BID        Osie Bond., M.A. [facility] Pager [phone] Office [phone]  12/16/2020,10:03 AM

## 2020-12-16 NOTE — Progress Notes (Signed)
MD on call notified of pt having a slight fever ranging from 99.2-99.4 on & off for 24 hours now. Hoover Brunette, RN

## 2020-12-16 NOTE — Progress Notes (Signed)
ANTICOAGULATION CONSULT NOTE - Follow Up Consult  Pharmacy Consult for Heparin>warfarin Indication: cardiac thrombus and stroke  Allergies  Allergen Reactions  . Bee Venom Anaphylaxis and Hives  . Capsicum (Cayenne) [Cayenne] Swelling    Patient Measurements: Height: 5\' 3"  (160 cm) Weight: 45.5 kg (100 lb 4.8 oz) IBW/kg (Calculated) : 52.4 Heparin Dosing Weight: 47.7 kg  Vital Signs: Temp: 99.5 F (37.5 C) (12/20 0801) Temp Source: Oral (12/20 0801) BP: 124/56 (12/20 0801) Pulse Rate: 66 (12/20 0801)  Labs: Recent Labs    12/13/20 1115 12/13/20 2032 12/14/20 0426 12/14/20 1409 12/15/20 0028 12/15/20 0808 12/16/20 0334  HGB 12.8  --  11.8*  --  11.5*  --  11.5*  HCT 40.5  --  37.7  --  34.5*  --  36.0  PLT 256  --  236  --  231  --  244  APTT 89*  --   --   --   --   --   --   LABPROT 13.2  --   --   --   --   --   --   INR 1.0  --   --   --   --   --   --   HEPARINUNFRC  --    < > 0.22*   < > 0.48 0.49 0.53  CREATININE 1.17*  --  1.00  1.02*  --  1.18*  --  1.02*   < > = values in this interval not displayed.    Estimated Creatinine Clearance: 37.9 mL/min (A) (by C-G formula based on SCr of 1.02 mg/dL (H)).  Assessment: 69 yr old female started on IV heparin on 12/10/20 for apical thrombus. Prior hx SAH, cleared for anticoagulation by Neuro. No plans for cath this admit. New CVA on 12/18, now using low therapeutic goal for heparin dosing.  Heparin level is at upper end therapeutic goal at 0.53. CBC stable. No bleeding issues noted. New orders today to transition to warfarin.   Goal of Therapy:  INR goal 2-3 Heparin level 0.3-0.5 units/ml Monitor platelets by anticoagulation protocol: Yes   Plan:  Reduce heparin infusion to 950 units/hr  Daily heparin level, CBC Warfarin 2.5mg  tonight Daily INR Warfarin education prior to discharge  Erin Hearing PharmD., BCPS Clinical Pharmacist 12/16/2020 9:28 AM

## 2020-12-16 NOTE — Progress Notes (Signed)
PROGRESS NOTE  Mindy Mills:[id number] DOB: 09-Sep-1951 DOA: 12/07/2020 PCP: Patient, No Pcp Per  Brief History   69 y.o. 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  Patient has since had PEG tube and tracheostomy removed.  She reports that for the past 3 days, she has progressive shortness of breath and cough.  She denies any fever.  She denies any vomiting.  She has not felt as though she is been choking after eating or drinking.  She has not had any chest pain.  She feels increasingly weak.  No dysuria, diarrhea.  Since she had progressive shortness of breath, she called EMS for further evaluation   ED Course: Upon arrival to the emergency room, patient is noted to have a mildly elevated creatinine.  Oxygen has been applied since she was noted to be hypoxic.  Oxygen saturations were 88% on room air.  D-dimer was noted to be elevated and CT of the chest was performed that indicated evidence of aspiration pneumonia.  COVID-19 test was found to be negative.  Troponin mildly elevated around 300 and EKG showed T wave inversions in the anterolateral leads.  She was referred for admission.   AM 12/13/20--patient was noted to be dysphasic, not responding to questions nor following commands by RN, and had right hemiparesis.  Code Stroke activated at 1100.  Work up revealed new acute L-MCA territory stroke, likely cardioembolic.  Teleneuro was activated and neurology was consulted.  Heparin drip was continued per stroke protocol dosing.    The patient was transferred to [facility] for continued stroke care on 12/14/2020. Neurology was consulted. EEG was performed and was negative for evidence of seizures.  Consultants  . Neurology . Cardiology  Procedures  . EEG  Antibiotics   Anti-infectives (From admission, onward)   Start     Dose/Rate Route Frequency Ordered Stop    12/15/20 0600  Ampicillin-Sulbactam (UNASYN) 3 g in sodium chloride 0.9 % 100 mL IVPB        3 g 200 mL/hr over 30 Minutes Intravenous Every 6 hours 12/14/20 2242     12/13/20 1430  Ampicillin-Sulbactam (UNASYN) 3 g in sodium chloride 0.9 % 100 mL IVPB  Status:  Discontinued        3 g 200 mL/hr over 30 Minutes Intravenous Every 6 hours 12/13/20 1343 12/14/20 2242   12/10/20 2300  amoxicillin-clavulanate (AUGMENTIN) 875-125 MG per tablet 1 tablet  Status:  Discontinued        1 tablet Oral Every 12 hours 12/10/20 2128 12/13/20 1343   12/07/20 1800  Ampicillin-Sulbactam (UNASYN) 3 g in sodium chloride 0.9 % 100 mL IVPB  Status:  Discontinued        3 g 200 mL/hr over 30 Minutes Intravenous Every 6 hours 12/07/20 1720 12/10/20 2128   12/07/20 1245  cefTRIAXone (ROCEPHIN) 1 g in sodium chloride 0.9 % 100 mL IVPB        1 g 200 mL/hr over 30 Minutes Intravenous  Once 12/07/20 1239 12/07/20 1413   12/07/20 1245  azithromycin (ZITHROMAX) 500 mg in sodium chloride 0.9 % 250 mL IVPB        500 mg 250 mL/hr over 60 Minutes Intravenous  Once 12/07/20 1239 12/07/20 1531     Subjective  The patient is resting comfortably. No new complaints.  Objective   Vitals:  Vitals:   12/16/20 0455 12/16/20 0801  BP: (!) 121/56 Marland Kitchen)  124/56  Pulse: 61 66  Resp: 16 16  Temp: 98.4 F (36.9 C) 99.5 F (37.5 C)  SpO2: 97% 98%   Exam:  Constitutional:  The patient is awake and alert. She does not speak to me. No acute distress. Respiratory:  . No increased work of breathing. . No wheezes, rales, or rhonchi . No tactile fremitus Cardiovascular:  . Regular rate and rhythm . No murmurs, ectopy, or gallups. . No lateral PMI. No thrills. Abdomen:  . Abdomen is soft, non-tender, non-distended . No hernias, masses, or organomegaly . Normoactive bowel sounds.  Musculoskeletal:  . No cyanosis, clubbing, or edema Skin:  . No rashes, lesions, ulcers . palpation of skin: no induration or  nodules Neurologic:  . 3/5 strength in the left upper extremity and 2/5 in the left lower extremity.  . Pt has severe expressive aphasia Psychiatric:  . Unable to evaluate as the patient is unable to cooperate with exam.  I have personally reviewed the following:   Today's Data  . Vitals, BMP, CBC, Lipid  Imaging  . CT head . MRI Head  Cardiology Data  . Echocardiogram  Scheduled Meds: . arformoterol  15 mcg Nebulization BID  . budesonide (PULMICORT) nebulizer solution  0.5 mg Nebulization BID  . coumadin book   Does not apply Once  . feeding supplement  237 mL Oral BID BM  . FLUoxetine  80 mg Oral Daily  . furosemide  40 mg Oral Daily  . guaiFENesin  600 mg Oral BID  . metoprolol succinate  12.5 mg Oral Daily  . warfarin  2.5 mg Oral ONCE-1600  . Warfarin - Pharmacist Dosing Inpatient   Does not apply q1600   Continuous Infusions: . ampicillin-sulbactam (UNASYN) IV 3 g (12/16/20 1415)  . heparin 950 Units/hr (12/16/20 1058)    Active Problems:   Acute respiratory failure (HCC)   Altered mental status   Aspiration pneumonia (HCC)   Elevated troponin   Acute systolic CHF (congestive heart failure) (HCC)   Emphysema/COPD (HCC)   Protein-calorie malnutrition, severe   Pneumonia of right lower lobe due to infectious organism   Cardioembolic stroke ([place])   LOS: 9 days   A & P  Acute Cardioembolic Stroke: Due to LV thrombus. Pt is on a heparin drip. Neurology has been consulted. She has been transferred to [facility] for ongoing neurological care. On 12/17 CT brain was suspicious for an acute left MCA infarct involving the insula and possibly the operculum. On 12/17 MR brain demonstrated an acute moderate size left MCA territory infarct. Discontiguous small acute infarct of the left caudate. Third small acute infarct of the right cerebellum.12/17 CTA H&N--Left M2 superior division occlusion; Associated left MCA territory infarct without evidence of a significant penumbra on CTP.   Patent carotid and vertebral arteries. HbA1C has is pending, but is unlikely to be positive for DM as the patient's glucoses have been in normal range since admission. Lipid panel is likewise pending. Speech has recommended a Dysphagia 1 diet. PT/OT eval is pending.   Acute encephalopathy due to CVA: Noted. Improving.   Acute respiratory failure with hypoxia: Secondary to pneumonia and CHF in setting of COPD. The patient is currently saturating 94% on 3L by nasal cannula. Wean off oxygen as tolerated.   Aspiration pneumonia: 12/1 CTA chest--no PE; extensive fluid and debris RLL bronchus. The patient has completed 7 days of IV unasyn. Aspiration is due to dysphagia.   Acute systolic CHF: As of echocardiogram performed on 12/09/2020 the  patient's EF is 25-30% with Grade 2 diastolic dysfunction and an apical thrombus. There is also trivial MR. 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 She is receiving PO lasix and IV heparin. Cardiology is following. There are plans for LHC in the next week if the patient is deemed stable.  COPD: The patient is continuing on duoneb, pulmicort, and brovana. She has incentive spirometry. She has a 50 pack her smoking history.  Elevated troponin: The patient is without chest pain. She does have T wave inversions in the precordial leads. Echocardiogram shows EF of 25-30%. This is possibly due to demand ischemia in the setting of pneumonia or stress induced CM as she has a history of Takotsubo's CM. She is on an aspirin and there are tentative plans for LHC in the next week.    Apical thrombus: Source of cardioembonic CVA. Continue heparin IV.   History of SAH: Noted. Patient had large [place] in 2015 from [place] aneurysm that was coiled at that time. She has been seen by neurology and felt reasonable to start on dual antiplatelet therapy versus anticoagulation as needed.   CKD IIIa: Creatinine at baseline. Monitor creatinine, electrolytes, and volume status.  Avoid nephrotoxins and hypotension.    Generalized weakness: The patient has been evaluated by physical therapy with recommendations for SNF. The patient is refusing SNF.   Hypokalemia: Resolved. Monitor and supplement as necessary.    Severe Protein Calorie Malnutrition: Nutrition consulted. Continued supplements.   I have seen and examined this patient myself. I have spent 36 minutes in her evaluation and treatment.     DVT Prophylaxis: Heparin gtt CODE STATUS: DNR Family Communication: None available Dispostion:  Status is: Inpatient   Remains inpatient appropriate because:IV treatments appropriate due to intensity of illness or inability to take PO   Dispo: The patient is from: Home              Anticipated d/c is to: SNF if changes mind              Anticipated d/c date is: 2 days              Patient currently is not medically stable to d/c.  Brynnleigh Mcelwee, DO [facility] Direct contact: see [email or web address]  7PM-7AM contact night coverage as above 12/16/2020, 5:16 PM  LOS: 8 days

## 2020-12-16 NOTE — Progress Notes (Signed)
Physical Therapy Treatment Patient Details Name: Mindy Mills MRN: [id number] DOB: 1951/02/23 Today's Date: 12/16/2020    History of Present Illness Pt adm to [facility] on 12/07/20 with acute systolic heart failure, aspiration PNA, apical thrombus, and acute respiratory failure with hypoxia. On 12/17 developed sudden onset of aphasia, altered mental status, and rt sided weakness. Code stroke called and pt found to have acute left MCA infarct. Transferred to [facility] on 12/18. PMH - SAH s/p coiling 2015, copd, chf, htn, migraine.    PT Comments    Pt with new lt MCA since last seen by PT. Pt reassessed. With new CVA recommend looking at [facility] for intensive rehab if pt has support at home.  Pt able to get OOB and ambulate short distance with assist today. Current goals remain appropriate.    Follow Up Recommendations  CIR     Equipment Recommendations  Rolling walker with 5" wheels    Recommendations for Other Services       Precautions / Restrictions Precautions Precautions: Fall    Mobility  Bed Mobility Overal bed mobility: Needs Assistance Bed Mobility: Supine to Sit     Supine to sit: Min assist     General bed mobility comments: Assist to elevate trunk into sitting  Transfers Overall transfer level: Needs assistance Equipment used: 1 person hand held assist;Rolling walker (2 wheeled) Transfers: Sit to/from [facility] Sit to Stand: Min assist Stand pivot transfers: Min assist       General transfer comment: Assist to bring hips up and for balance. Hand held assist for bed to chair  Ambulation/Gait Ambulation/Gait assistance: Min assist Gait Distance (Feet): 6 Feet (forward and backward) Assistive device: Rolling walker (2 wheeled) Gait Pattern/deviations: Step-through pattern;Decreased step length - right;Decreased dorsiflexion - right Gait velocity: decr Gait velocity interpretation: <1.31 ft/sec, indicative of household ambulator General Gait  Details: Assist for balance and support. Decr clearance of rt foot during swing with scuffing of foot.   Stairs             Wheelchair Mobility    Modified Rankin (Stroke Patients Only) Modified Rankin (Stroke Patients Only) Pre-Morbid Rankin Score: No symptoms Modified Rankin: Moderately severe disability     Balance Overall balance assessment: Needs assistance Sitting-balance support: No upper extremity supported;Feet supported Sitting balance-Leahy Scale: Fair     Standing balance support: Single extremity supported;During functional activity Standing balance-Leahy Scale: Poor Standing balance comment: UE support                            Cognition Arousal/Alertness: Awake/alert Behavior During Therapy: Impulsive Overall Cognitive Status: Difficult to assess                                 General Comments: Pt nonverbal and not nodding/shaking head consistently. Followed 1 step commands ~80% of the time.      Exercises      General Comments General comments (skin integrity, edema, etc.): LLE strength grossly 4+/5. RLE strength 3+/5      Pertinent Vitals/Pain Pain Assessment: Faces Faces Pain Scale: No hurt    Home Living                      Prior Function            PT Goals (current goals can now be found in the care plan  section) Acute Rehab PT Goals Patient Stated Goal: Pt unable to state due to aphasia Progress towards PT goals: Progressing toward goals    Frequency    Min 4X/week      PT Plan Discharge plan needs to be updated;Frequency needs to be updated    Co-evaluation              AM-PAC PT "6 Clicks" Mobility   Outcome Measure  Help needed turning from your back to your side while in a flat bed without using bedrails?: A Little Help needed moving from lying on your back to sitting on the side of a flat bed without using bedrails?: A Little Help needed moving to and from a bed to a  chair (including a wheelchair)?: A Little Help needed standing up from a chair using your arms (e.g., wheelchair or bedside chair)?: A Little Help needed to walk in hospital room?: A Little Help needed climbing 3-5 steps with a railing? : A Lot 6 Click Score: 17    End of Session Equipment Utilized During Treatment: Oxygen Activity Tolerance: Patient tolerated treatment well Patient left: in chair;with call bell/phone within reach;with chair alarm set Nurse Communication: Mobility status PT Visit Diagnosis: Other abnormalities of gait and mobility (R26.89);Hemiplegia and hemiparesis Hemiplegia - Right/Left: Right Hemiplegia - dominant/non-dominant: Dominant Hemiplegia - caused by: Cerebral infarction     Time: 2902-1115 PT Time Calculation (min) (ACUTE ONLY): 19 min  Charges:                        [place] Pager [phone] Office [place] 12/16/2020, 5:23 PM

## 2020-12-16 NOTE — TOC Initial Note (Signed)
Transition of Care [facility]) - Initial/Assessment Note    Patient Details  Name: Mindy Mills MRN: [id number] Date of Birth: January 23, 1951  [facility] of [facility] CM/SW Contact:    Mindy Mills, [place] Phone Number: 12/16/2020, 3:53 PM  Clinical Narrative:                 CSW reached out to pt's next of kin, Mindy Mills to complete an assessment and speak about potential SNF options, had to leave a vm.   Expected Discharge Plan: [place] Barriers to Discharge: Continued Medical Work up   Patient Goals and CMS Choice        Expected Discharge Plan and Services Expected Discharge Plan: [place] In-house Referral: Clinical Social Work     Living arrangements for the past 2 months: [place]                                      Prior Living Arrangements/Services Living arrangements for the past 2 months: Single Family Home Lives with:: Mindy Mills Patient language and need for interpreter reviewed:: Yes        Need for Family Participation in Patient Care: Mindy (Comment) Care giver support system in place?: Mindy (comment)   Criminal Activity/Legal Involvement Pertinent to Current Situation/Hospitalization: Mindy - Comment as needed  Activities of Daily Living Home Assistive Devices/Equipment: None ADL Screening (condition at time of admission) Patient's cognitive ability adequate to safely complete daily activities?: Yes Is the patient deaf or have difficulty hearing?: Yes Does the patient have difficulty seeing, even when wearing glasses/contacts?: Mindy Does the patient have difficulty concentrating, remembering, or making decisions?: Mindy Patient able to express need for assistance with ADLs?: Yes Does the patient have difficulty dressing or bathing?: Mindy Independently performs ADLs?: Yes (appropriate for developmental age) Does the patient have difficulty walking or climbing stairs?: Yes Weakness of Legs: Both Weakness of  Arms/Hands: None  Permission Sought/Granted                  Emotional Assessment   Attitude/Demeanor/Rapport: Engaged Affect (typically observed): Pleasant Orientation: : Oriented to Mindy Mills,Oriented to Place,Oriented to  Time,Oriented to Situation Alcohol / Substance Use: Not Applicable Psych Involvement: Mindy (comment)  Admission diagnosis:  Aspiration pneumonia ([place]) [J69.0] Patient Active Problem List   Diagnosis Date Noted   Cardioembolic stroke ([place]) 02/63/7858   Protein-calorie malnutrition, severe 12/12/2020   Pneumonia of right lower lobe due to infectious organism    Acute systolic CHF (congestive heart failure) ([place]) 12/09/2020   Emphysema/COPD ([place]) 12/09/2020   Aspiration pneumonia ([place]) 12/07/2020   Elevated troponin 12/07/2020   Closed fracture of right distal radius 09/07/2018   Fall 09/08/2015   UTI (lower urinary tract infection) 09/08/2015   Dehydration 09/08/2015   Sepsis ([place]) 85/01/7740   Systolic heart failure ([place]) 09/08/2015   Confusion 09/08/2015   TIA (transient ischemic attack) 09/08/2015   Acute on chronic respiratory failure ([place]) 09/24/2014   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/2014   Demand ischemia of myocardium: in setting of CVA 09/17/2014   Cardiomyopathy, idiopathic - in setting of [place], LAD WMA on Echo & demand ischemia-infarction 09/17/2014   Subarachnoid hemorrhage due to ruptured aneurysm ([place]) 09/11/2014   Acute respiratory failure ([place]) 09/11/2014   Hypokalemia 09/11/2014   Altered mental status 09/11/2014   Subarachnoid hemorrhage ([place]) 09/11/2014   Mills:  Patient, Mindy Mills  Per Pharmacy:   [id number] [address] [place] [address] Phone: [phone] Fax: [phone]     Social Determinants of Health (SDOH) Interventions    Readmission Risk Interventions Mindy flowsheet data found.

## 2020-12-16 NOTE — NC FL2 (Signed)
[place] LEVEL OF CARE SCREENING TOOL     IDENTIFICATION  Patient Name: Mindy Mills Birthdate: 1951/09/23 Sex: female Admission Date (Current Location): 12/07/2020  [employer] and [place] Number:  [employer] and Address:  [facility], [place] [address] [place], [place] [address]      Provider Number: [id number]  Attending Physician Name and Address:  Karie Kirks, DO  Relative Name and Phone Number:  Polo Riley [phone]    Current Level of Care: Hospital Recommended Level of Care: [place] Prior Approval Number:    Date Approved/Denied:   PASRR Number: [id number]  Discharge Plan: SNF    Current Diagnoses: Patient Active Problem List   Diagnosis Date Noted  . Cardioembolic stroke ([place]) 39/76/7341  . Protein-calorie malnutrition, severe 12/12/2020  . Pneumonia of right lower lobe due to infectious organism   . Acute systolic CHF (congestive heart failure) ([place]) 12/09/2020  . Emphysema/COPD ([place]) 12/09/2020  . Aspiration pneumonia ([place]) 12/07/2020  . Elevated troponin 12/07/2020  . Closed fracture of right distal radius 09/07/2018  . Fall 09/08/2015  . UTI (lower urinary tract infection) 09/08/2015  . Dehydration 09/08/2015  . Sepsis ([place]) 09/08/2015  . Systolic heart failure ([place]) 09/08/2015  . Confusion 09/08/2015  . TIA (transient ischemic attack) 09/08/2015  . Acute on chronic respiratory failure ([place]) 09/24/2014  . 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 Demand ischemia of myocardium: in setting of CVA 09/17/2014  . 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 Subarachnoid hemorrhage due to ruptured aneurysm ([place]) 09/11/2014  . Acute respiratory failure ([place]) 09/11/2014  . Hypokalemia 09/11/2014  . Altered mental status 09/11/2014  . Subarachnoid hemorrhage ([place]) 09/11/2014    Orientation RESPIRATION BLADDER Height &  Weight      (Disoriented X4)  O2 ( 3 Liters) Incontinent Weight: 100 lb 4.8 oz (45.5 kg) Height:  5\' 3"  (160 cm)  BEHAVIORAL SYMPTOMS/MOOD NEUROLOGICAL BOWEL NUTRITION STATUS      Continent Diet (see dc summary)  AMBULATORY STATUS COMMUNICATION OF NEEDS Skin   Limited Assist Non-Verbally Normal                       Personal Care Assistance Level of Assistance  Bathing,Feeding,Dressing Bathing Assistance: Limited assistance Feeding assistance: Limited assistance Dressing Assistance: Limited assistance     Functional Limitations Info  Sight,Hearing,Speech Sight Info: Adequate Hearing Info: Adequate Speech Info: Adequate    SPECIAL CARE FACTORS FREQUENCY  OT (By licensed OT),PT (By licensed PT)     PT Frequency: 5X week OT Frequency: 5X week            Contractures Contractures Info: Not present    Additional Factors Info  Code Status,Allergies,Psychotropic Code Status Info: DNR Allergies Info: Bee venom, Capsicum, Cayenne Psychotropic Info: FLUoxetine (PROZAC) capsule 80 mg daily,         Current Medications (12/16/2020):  This is the current hospital active medication list Current Facility-Administered Medications  Medication Dose Route Frequency Provider Last Rate Last Admin  . albuterol (PROVENTIL) (2.5 MG/3ML) 0.083% nebulizer solution 2.5 mg  2.5 mg Nebulization Q4H PRN Tat, David, MD      . Ampicillin-Sulbactam (UNASYN) 3 g in sodium chloride 0.9 % 100 mL IVPB  3 g Intravenous Q6H Einar Grad, RPH 200 mL/hr at 12/16/20 1415 3 g at 12/16/20 1415  . arformoterol (BROVANA) nebulizer solution 15 mcg  15 mcg  Nebulization BID Orson Eva, MD   15 mcg at 12/15/20 2045  . budesonide (PULMICORT) nebulizer solution 0.5 mg  0.5 mg Nebulization BID Orson Eva, MD   0.5 mg at 12/15/20 2045  . coumadin book   Does not apply Once Lyndee Leo, [facility]      . feeding supplement (ENSURE ENLIVE / ENSURE PLUS) liquid 237 mL  237 mL Oral BID BM Orson Eva, MD   237  mL at 12/14/20 0913  . FLUoxetine (PROZAC) capsule 80 mg  80 mg Oral Daily Tat, David, MD   80 mg at 12/16/20 1058  . furosemide (LASIX) tablet 40 mg  40 mg Oral Daily Tat, David, MD   40 mg at 12/16/20 1057  . guaiFENesin (MUCINEX) 12 hr tablet 600 mg  600 mg Oral BID Tat, David, MD   600 mg at 12/16/20 1057  . heparin ADULT infusion 100 units/mL (25000 units/280mL sodium chloride 0.45%)  950 Units/hr Intravenous Continuous Lyndee Leo, RPH 9.5 mL/hr at 12/16/20 1058 950 Units/hr at 12/16/20 1058  . metoprolol succinate (TOPROL-XL) 24 hr tablet 12.5 mg  12.5 mg Oral Daily Tat, David, MD   12.5 mg at 12/16/20 1057  . ondansetron (ZOFRAN) injection 4 mg  4 mg Intravenous Q6H PRN Orson Eva, MD   4 mg at 12/12/20 1731  . Resource ThickenUp Clear   Oral PRN Swayze, Ava, DO      . traMADol (ULTRAM) tablet 50 mg  50 mg Oral Q6H PRN Orson Eva, MD   50 mg at 12/09/20 1803  . warfarin (COUMADIN) tablet 2.5 mg  2.5 mg Oral ONCE-1600 Lyndee Leo, [facility]      . Warfarin - Pharmacist Dosing Inpatient   Does not apply [place], [facility]         Discharge Medications: Please see discharge summary for a list of discharge medications.  Relevant Imaging Results:  Relevant Lab Results:   Additional Information    Garyn Arlotta B Dorcas Melito, LCSWA

## 2020-12-17 LAB — BASIC METABOLIC PANEL
Anion gap: 13 (ref 5–15)
BUN: 12 mg/dL (ref 8–23)
CO2: 27 mmol/L (ref 22–32)
Calcium: 8.6 mg/dL — ABNORMAL LOW (ref 8.9–10.3)
Chloride: 100 mmol/L (ref 98–111)
Creatinine, Ser: 0.95 mg/dL (ref 0.44–1.00)
GFR, Estimated: >60 mL/min (ref >60)
Glucose, Bld: 104 mg/dL — ABNORMAL HIGH (ref 70–99)
Potassium: 2.7 mmol/L — CL (ref 3.5–5.1)
Sodium: 140 mmol/L (ref 135–145)

## 2020-12-17 LAB — CBC
HCT: 33 % — ABNORMAL LOW (ref 36.0–46.0)
Hemoglobin: 10.9 g/dL — ABNORMAL LOW (ref 12.0–15.0)
MCH: 30.7 pg (ref 26.0–34.0)
MCHC: 33 g/dL (ref 30.0–36.0)
MCV: 93 fL (ref 80.0–100.0)
Platelets: 250 10*3/uL (ref 150–400)
RBC: 3.55 MIL/uL — ABNORMAL LOW (ref 3.87–5.11)
RDW: 13.6 % (ref 11.5–15.5)
WBC: 9.4 10*3/uL (ref 4.0–10.5)
nRBC: 0 % (ref 0.0–0.2)

## 2020-12-17 LAB — PROTIME-INR
INR: 1.1 (ref 0.8–1.2)
Prothrombin Time: 13.8 seconds (ref 11.4–15.2)

## 2020-12-17 LAB — HEPARIN LEVEL (UNFRACTIONATED): Heparin Unfractionated: 0.39 IU/mL (ref 0.30–0.70)

## 2020-12-17 MED ORDER — POTASSIUM CHLORIDE 10 MEQ/100ML IV SOLN
10.0000 meq | INTRAVENOUS | Status: AC
  Administered 2020-12-17 (×5): 10 meq via INTRAVENOUS
  Filled 2020-12-17 (×5): qty 100

## 2020-12-17 MED ORDER — WARFARIN SODIUM 2.5 MG PO TABS
2.5000 mg | ORAL_TABLET | Freq: Once | ORAL | Status: AC
  Administered 2020-12-17: 16:00:00 2.5 mg via ORAL
  Filled 2020-12-17: qty 1

## 2020-12-17 MED ORDER — SODIUM CHLORIDE 0.9 % IV SOLN
3.0000 g | Freq: Four times a day (QID) | INTRAVENOUS | Status: DC
  Administered 2020-12-17 – 2020-12-19 (×9): 3 g via INTRAVENOUS
  Filled 2020-12-17: qty 3
  Filled 2020-12-17 (×2): qty 8
  Filled 2020-12-17: qty 3
  Filled 2020-12-17 (×3): qty 8
  Filled 2020-12-17: qty 3
  Filled 2020-12-17 (×2): qty 8
  Filled 2020-12-17 (×2): qty 3

## 2020-12-17 NOTE — Progress Notes (Addendum)
ANTICOAGULATION CONSULT NOTE - Follow Up Consult  Pharmacy Consult for Heparin>warfarin Indication: cardiac thrombus and stroke  Allergies  Allergen Reactions  . Bee Venom Anaphylaxis and Hives  . Capsicum (Cayenne) [Cayenne] Swelling    Patient Measurements: Height: 5\' 3"  (160 cm) Weight: 44.8 kg (98 lb 12.3 oz) IBW/kg (Calculated) : 52.4 Heparin Dosing Weight: 47.7 kg  Vital Signs: Temp: 99 F (37.2 C) (12/21 0437) Temp Source: Oral (12/21 0437) BP: 125/62 (12/21 0437) Pulse Rate: 60 (12/21 0437)  Labs: Recent Labs    12/15/20 0028 12/15/20 0808 12/16/20 0334 12/17/20 0227  HGB 11.5*  --  11.5* 10.9*  HCT 34.5*  --  36.0 33.0*  PLT 231  --  244 250  LABPROT  --   --   --  13.8  INR  --   --   --  1.1  HEPARINUNFRC 0.48 0.49 0.53 0.39  CREATININE 1.18*  --  1.02* 0.95    Estimated Creatinine Clearance: 40.1 mL/min (by C-G formula based on SCr of 0.95 mg/dL).  Assessment: 69 yr old female started on IV heparin on 12/10/20 for apical thrombus. Prior hx SAH, cleared for anticoagulation by Neuro. No plans for cath this admit. New CVA on 12/18, now using low therapeutic goal for heparin dosing.  Heparin level is at goal at 0.39. CBC stable. No bleeding issues noted. Warfarin started yesterday. INR 1.1.   Goal of Therapy:  INR goal 2-3 Heparin level 0.3-0.5 units/ml Monitor platelets by anticoagulation protocol: Yes   Plan:  Continue heparin infusion at 950 units/hr  Daily heparin level, CBC Warfarin 2.5mg  tonight Daily INR  Erin Hearing PharmD., BCPS Clinical Pharmacist 12/17/2020 2:08 PM

## 2020-12-17 NOTE — Progress Notes (Signed)
PROGRESS NOTE  Mindy Mills LDJ:[id number] DOB: 1951/08/26 DOA: 12/07/2020 PCP: Patient, No Pcp Per  Brief History   69 y.o. 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  Patient has since had PEG tube and tracheostomy removed.  She reports that for the past 3 days, she has progressive shortness of breath and cough.  She denies any fever.  She denies any vomiting.  She has not felt as though she is been choking after eating or drinking.  She has not had any chest pain.  She feels increasingly weak.  No dysuria, diarrhea.  Since she had progressive shortness of breath, she called EMS for further evaluation   ED Course: Upon arrival to the emergency room, patient is noted to have a mildly elevated creatinine.  Oxygen has been applied since she was noted to be hypoxic.  Oxygen saturations were 88% on room air.  D-dimer was noted to be elevated and CT of the chest was performed that indicated evidence of aspiration pneumonia.  COVID-19 test was found to be negative.  Troponin mildly elevated around 300 and EKG showed T wave inversions in the anterolateral leads.  She was referred for admission.   AM 12/13/20--patient was noted to be dysphasic, not responding to questions nor following commands by RN, and had right hemiparesis.  Code Stroke activated at 1100.  Work up revealed new acute L-MCA territory stroke, likely cardioembolic.  Teleneuro was activated and neurology was consulted.  Heparin drip was continued per stroke protocol dosing.  This is being transitioned to coumadin. Pharmacy is managing this.  The patient was transferred to [facility] for continued stroke care on 12/14/2020. Neurology was consulted. EEG was performed and was negative for evidence of seizures.  Consultants  . Neurology . Cardiology  Procedures  . EEG  Antibiotics   Anti-infectives (From admission,  onward)   Start     Dose/Rate Route Frequency Ordered Stop   12/17/20 0830  Ampicillin-Sulbactam (UNASYN) 3 g in sodium chloride 0.9 % 100 mL IVPB        3 g 200 mL/hr over 30 Minutes Intravenous Every 6 hours 12/17/20 0802     12/15/20 0600  Ampicillin-Sulbactam (UNASYN) 3 g in sodium chloride 0.9 % 100 mL IVPB  Status:  Discontinued        3 g 200 mL/hr over 30 Minutes Intravenous Every 6 hours 12/14/20 2242 12/17/20 0802   12/13/20 1430  Ampicillin-Sulbactam (UNASYN) 3 g in sodium chloride 0.9 % 100 mL IVPB  Status:  Discontinued        3 g 200 mL/hr over 30 Minutes Intravenous Every 6 hours 12/13/20 1343 12/14/20 2242   12/10/20 2300  amoxicillin-clavulanate (AUGMENTIN) 875-125 MG per tablet 1 tablet  Status:  Discontinued        1 tablet Oral Every 12 hours 12/10/20 2128 12/13/20 1343   12/07/20 1800  Ampicillin-Sulbactam (UNASYN) 3 g in sodium chloride 0.9 % 100 mL IVPB  Status:  Discontinued        3 g 200 mL/hr over 30 Minutes Intravenous Every 6 hours 12/07/20 1720 12/10/20 2128   12/07/20 1245  cefTRIAXone (ROCEPHIN) 1 g in sodium chloride 0.9 % 100 mL IVPB        1 g 200 mL/hr over 30 Minutes Intravenous  Once 12/07/20 1239 12/07/20 1413   12/07/20 1245  azithromycin (ZITHROMAX) 500 mg in sodium chloride 0.9 % 250 mL IVPB  500 mg 250 mL/hr over 60 Minutes Intravenous  Once 12/07/20 1239 12/07/20 1531     Subjective  The patient is resting comfortably. No new complaints.  Objective   Vitals:  Vitals:   12/16/20 2335 12/17/20 0437  BP: 115/60 125/62  Pulse: (!) 57 60  Resp: 16 18  Temp: 98.4 F (36.9 C) 99 F (37.2 C)  SpO2: 98%    Exam:  Constitutional:  The patient is awake and alert. She does not speak to me. No acute distress. Respiratory:  . No increased work of breathing. . No wheezes, rales, or rhonchi . No tactile fremitus Cardiovascular:  . Regular rate and rhythm . No murmurs, ectopy, or gallups. . No lateral PMI. No thrills. Abdomen:   . Abdomen is soft, non-tender, non-distended . No hernias, masses, or organomegaly . Normoactive bowel sounds.  Musculoskeletal:  . No cyanosis, clubbing, or edema Skin:  . No rashes, lesions, ulcers . palpation of skin: no induration or nodules Neurologic:  . 3/5 strength in the left upper extremity and 2/5 in the left lower extremity.  . Pt has severe expressive aphasia Psychiatric:  . Unable to evaluate as the patient is unable to cooperate with exam.  I have personally reviewed the following:   Today's Data  . Vitals, BMP, CBC, INR  Imaging  . CT head . MRI Head  Cardiology Data  . Echocardiogram  Scheduled Meds: . arformoterol  15 mcg Nebulization BID  . budesonide (PULMICORT) nebulizer solution  0.5 mg Nebulization BID  . coumadin book   Does not apply Once  . feeding supplement  237 mL Oral BID BM  . FLUoxetine  80 mg Oral Daily  . furosemide  40 mg Oral Daily  . guaiFENesin  600 mg Oral BID  . metoprolol succinate  12.5 mg Oral Daily  . Warfarin - Pharmacist Dosing Inpatient   Does not apply q1600   Continuous Infusions: . ampicillin-sulbactam (UNASYN) IV 3 g (12/17/20 1550)  . heparin 950 Units/hr (12/17/20 0203)    Active Problems:   Acute respiratory failure (HCC)   Altered mental status   Aspiration pneumonia (HCC)   Elevated troponin   Acute systolic CHF (congestive heart failure) (HCC)   Emphysema/COPD (HCC)   Protein-calorie malnutrition, severe   Pneumonia of right lower lobe due to infectious organism   Cardioembolic stroke ([place])   LOS: 10 days   A & P  Acute Cardioembolic Stroke: Due to LV thrombus. Pt is on a heparin drip. Neurology has been consulted. She has been transferred to [facility] for ongoing neurological care. On 12/17 CT brain was suspicious for an acute left MCA infarct involving the insula and possibly the operculum. On 12/17 MR brain demonstrated an acute moderate size left MCA territory infarct. Discontiguous small acute infarct  of the left caudate. Third small acute infarct of the right cerebellum.12/17 CTA H&N--Left M2 superior division occlusion; Associated left MCA territory infarct without evidence of a significant penumbra on CTP.  Patent carotid and vertebral arteries. HbA1C has is pending, but is unlikely to be positive for DM as the patient's glucoses have been in normal range since admission. Lipid panel is likewise pending. PT/OT has recommended CIR. Rehab has been consulted and is considering the patient, although it is not clear that she is a candidate.  Acute encephalopathy due to CVA: Noted. Improving.   Acute respiratory failure with hypoxia: Secondary to pneumonia and CHF in setting of COPD. The patient is currently saturating 94% on 3L  by nasal cannula. Wean off oxygen as tolerated.   Aspiration pneumonia: 12/1 CTA chest--no PE; extensive fluid and debris RLL bronchus. The patient has completed 7 days of IV unasyn. Aspiration is due to dysphagia. Speech has recommended a Dysphagia 1 diet.    Acute systolic CHF: As of echocardiogram performed on 12/09/2020 the patient's EF is 25-30% with Grade 2 diastolic dysfunction and an apical thrombus. There is also trivial MR. 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 She is receiving PO lasix and IV heparin. Cardiology is following. There are plans for LHC in the next week if the patient is deemed stable.  COPD: The patient is continuing on duoneb, pulmicort, and brovana. She has incentive spirometry. She has a 50 pack her smoking history.  Elevated troponin: The patient is without chest pain. She does have T wave inversions in the precordial leads. Echocardiogram shows EF of 25-30%. This is possibly due to demand ischemia in the setting of pneumonia or stress induced CM as she has a history of Takotsubo's CM. She is on an aspirin and there are tentative plans for [facility] as outpatient when the patient has completely recovered from this stroke.    Apical  thrombus: Source of cardioembonic CVA.  Heparin drip was continued per stroke protocol dosing.  This is being transitioned to coumadin. Pharmacy is managing this.  History of SAH: Noted. 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 She has been seen by neurology and felt reasonable to start on dual antiplatelet therapy versus anticoagulation as needed.   CKD IIIa: Creatinine at baseline. Monitor creatinine, electrolytes, and volume status. Avoid nephrotoxins and hypotension.    Generalized weakness: The patient has been evaluated by physical therapy with recommendations for SNF. The patient is refusing SNF.   Hypokalemia: Resolved. Monitor and supplement as necessary.    Severe Protein Calorie Malnutrition: Nutrition consulted. Continued supplements.   I have seen and examined this patient myself. I have spent 36 minutes in her evaluation and treatment.     DVT Prophylaxis: Heparin gtt CODE STATUS: DNR Family Communication: None available Dispostion:  Status is: Inpatient   Remains inpatient appropriate because:IV treatments appropriate due to intensity of illness or inability to take PO   Dispo: The patient is from: Home              Anticipated d/c is to: CIR vs SNF              Anticipated d/c date is: 2 days              Patient currently is not medically stable to d/c. INR remains subtherapeutic.  Nazly Digilio, DO [facility] Direct contact: see [email or web address]  7PM-7AM contact night coverage as above 12/17/2020, 5:46 PM  LOS: 8 days

## 2020-12-17 NOTE — Consult Note (Signed)
Physical Medicine and Rehabilitation Consult Reason for Consult: Inability to speak with decreased functional mobility/right side weakness Referring Physician: [facility]   HPI: Mindy Mills is a 69 y.o. 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  Mobile home 4 steps to entry.  Independent with assistive device and still drives.  Presented 12/07/2020 with increasing shortness of breath x3 days and cough.  Denied any fever or vomiting.  Oxygen saturations 88% room air, WBC 14,900, chemistries unremarkable except BUN 44 creatinine 1.22, lactic acid 1.8, blood cultures no growth to date, procalcitonin 0.48, troponin 333.  CT angiogram of the chest showed no pulmonary emboli.  Severe emphysematous changes and pulmonary scarring.  Extensive fluid and debris changes and pulmonary scarring.  Chronic appearing left-sided hydronephrosis with fairly significant renal cortical thinning.  Placed on broad-spectrum antibiotic and currently maintained on Unasyn.  Cardiology services consulted for elevated troponin.  Echocardiogram showed 0.7 x 1.3 cm mural apical thrombus.  Left ventricular ejection fraction by estimation 25 to 30%.  Placed on IV heparin and transition to Coumadin.  Neurology services consulted 12/09/2020 for altered mental status right side weakness and aphasia.  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  EEG negative for seizure.  Dysphagia #1 honey thick liquid diet.  Therapy evaluations completed secondary to right side weakness altered mental status with recommendations of physical medicine rehab consult.   Pt scheduled for possible left heart cath next week.   I attempted to ask questions of pt-she was completely unable to  answer them in words or nodding head in any way- she said "fine", but no other verbalizations/vocalizations.     Review of Systems  Unable to perform ROS: Patient nonverbal  Constitutional: Negative for chills and fever.  HENT: Negative for hearing loss.   Eyes: Negative for blurred vision and double vision.  Respiratory: Positive for cough and shortness of breath.   Cardiovascular: Positive for leg swelling. Negative for chest pain and palpitations.  Gastrointestinal: Positive for constipation. Negative for heartburn, nausea and vomiting.  Genitourinary: Negative for dysuria, flank pain and hematuria.  Musculoskeletal: Positive for joint pain and myalgias.  Skin: Negative for rash.  Neurological: Positive for speech change, weakness and headaches.  All other systems reviewed and are negative.  Past Medical History:  Diagnosis Date  . 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 Full dentures   . Hepatitis C    denies  . Hypertension   . Migraine   . Myocardial infarction ([place])   . Stroke [facility]) 2015   Pt reports hx of CVA x3.  Last one in 2015  . Wears glasses   . Wears hearing aid in both ears    Past Surgical History:  Procedure Laterality Date  . ABDOMINAL HYSTERECTOMY    . BUNIONECTOMY    . COLONOSCOPY    . MULTIPLE TOOTH EXTRACTIONS    . OPEN REDUCTION INTERNAL FIXATION (ORIF) DISTAL RADIAL FRACTURE Right 09/07/2018   Procedure: OPEN REDUCTION INTERNAL FIXATION (ORIF) DISTAL RADIAL FRACTURE;  Surgeon: Iran Planas, MD;  Location: [place];  Service: Orthopedics;  Laterality: Right;  . PEG PLACEMENT N/A 09/21/2014   Procedure: PERCUTANEOUS ENDOSCOPIC GASTROSTOMY (PEG) PLACEMENT (BED SIDE);  Surgeon: Georganna Skeans, MD;  Location: [place];  Service: General;  Laterality:  N/A;  . RADIOLOGY WITH ANESTHESIA N/A 09/11/2014   Procedure: Sub Arachnoid Aneurysm Coiling -RADIOLOGY WITH ANESTHESIA ;  Surgeon: Consuella Lose, MD;  Location: [place];  Service: Radiology;  Laterality: N/A;    Family History  Problem Relation Age of Onset  . Alcohol abuse Father    Social History:  reports that she quit smoking about 5 years ago. Her smoking use included cigarettes. She smoked 0.50 packs per day. She has never used smokeless tobacco. She reports previous alcohol use. She reports that she does not use drugs. Allergies:  Allergies  Allergen Reactions  . Bee Venom Anaphylaxis and Hives  . Capsicum (Cayenne) [Cayenne] Swelling   Medications Prior to Admission  Medication Sig Dispense Refill  . aspirin 325 MG EC tablet Take 325 mg by mouth daily.    . cholecalciferol (VITAMIN D) 1000 units tablet Take 1,000 Units by mouth daily.    Marland Kitchen FLUoxetine (PROZAC) 20 MG capsule Take 80 mg by mouth daily.    . traZODone (DESYREL) 100 MG tablet Take 200 mg by mouth at bedtime.    . clonazePAM (KLONOPIN) 0.5 MG tablet Take 1 tablet (0.5 mg total) by mouth 2 (two) times daily. (Patient not taking: No sig reported) 20 tablet 0  . docusate sodium (COLACE) 100 MG capsule Take 1 capsule (100 mg total) by mouth 2 (two) times daily. (Patient not taking: No sig reported) 10 capsule 0  . EPINEPHrine 0.3 mg/0.3 mL IJ SOAJ injection Inject 0.3 mg into the muscle once as needed (for allergic reaction).    . feeding supplement, ENSURE ENLIVE, (ENSURE ENLIVE) LIQD Take 237 mLs by mouth daily. (Patient not taking: Reported on 12/07/2020)    . methocarbamol (ROBAXIN) 500 MG tablet Take 1 tablet (500 mg total) by mouth every 6 (six) hours as needed for muscle spasms. (Patient not taking: No sig reported) 60 tablet 0  . oxyCODONE-acetaminophen (PERCOCET) 5-325 MG tablet Take 1-2 tablets by mouth every 6 (six) hours as needed. (Patient not taking: No sig reported) 20 tablet 0  . pantoprazole (PROTONIX) 40 MG tablet Take 1 tablet (40 mg total) by mouth at bedtime. (Patient not taking: No sig reported)    . rosuvastatin (CRESTOR) 40 MG tablet Take 20 mg by mouth daily. (Patient not taking: No sig reported)    .  traMADol (ULTRAM) 50 MG tablet Take 100 mg by mouth 4 (four) times daily as needed for moderate pain. (Patient not taking: No sig reported)      Home: Home Living Family/patient expects to be discharged to:: Private residence Living Arrangements: Non-relatives/Friends Available Help at Discharge: Friend(s),Available 24 hours/day Type of Home: Mobile home Home Access: Stairs to enter [employer] of Steps: 4 Entrance Stairs-Rails: Right,Left,Can reach both Home Layout: One level Bathroom Shower/Tub: Chiropodist: Standard Bathroom Accessibility: Yes Home Equipment: Cane - single point  Functional History: Prior Function Level of Independence: Independent with assistive device(s) Comments: Lawyer SPC, drives Functional Status:  Mobility: Bed Mobility Overal bed mobility: Needs Assistance Bed Mobility: Supine to Sit Rolling: Min guard Supine to sit: Min assist General bed mobility comments: Assist to elevate trunk into sitting Transfers Overall transfer level: Needs assistance Equipment used: 1 person hand held assist,Rolling walker (2 wheeled) Transfers: Sit to/from [place] to Stand: Min assist Stand pivot transfers: Min assist General transfer comment: Assist to bring hips up and for balance. Hand held assist for bed to chair Ambulation/Gait Ambulation/Gait assistance: Min assist Gait Distance (Feet):  6 Feet (forward and backward) Assistive device: Rolling walker (2 wheeled) Gait Pattern/deviations: Step-through pattern,Decreased step length - right,Decreased dorsiflexion - right General Gait Details: Assist for balance and support. Decr clearance of rt foot during swing with scuffing of foot. Gait velocity: decr Gait velocity interpretation: <1.31 ft/sec, indicative of household ambulator    ADL:    Cognition: Cognition Overall Cognitive Status: Difficult to assess Arousal/Alertness:  Awake/alert Orientation Level: Oriented to person Attention: Sustained Sustained Attention: Appears intact Memory:  (TBA) Awareness: Impaired Awareness Impairment: Emergent impairment Problem Solving: Impaired Problem Solving Impairment: Functional basic Safety/Judgment: Impaired Cognition Arousal/Alertness: Awake/alert Behavior During Therapy: Impulsive Overall Cognitive Status: Difficult to assess General Comments: Pt nonverbal and not nodding/shaking head consistently. Followed 1 step commands ~80% of the time. Difficult to assess due to: Impaired communication  Blood pressure 125/62, pulse 60, temperature 99 F (37.2 C), temperature source Oral, resp. rate 18, height 5\' 3"  (1.6 m), weight 44.8 kg, SpO2 98 %. Physical Exam Vitals and nursing note reviewed.  Constitutional:      Comments: Pt awake, appeared alert, but very aphasic/confused- was sitting with privates/chest on display, and kept uncovering herself, when I covered her up, NAD  HENT:     Head: Normocephalic and atraumatic.     Comments: At rest, no facial droop- unable to comply with CN exam , so could not assess smile    Right Ear: External ear normal.     Left Ear: External ear normal.     Nose: Nose normal. No congestion.     Mouth/Throat:     Mouth: Mucous membranes are dry.     Pharynx: Oropharynx is clear. No oropharyngeal exudate.  Eyes:     General:        Right eye: No discharge.        Left eye: No discharge.     Comments: Blinking normally- R inattention  Cardiovascular:     Rate and Rhythm: Normal rate and regular rhythm.     Heart sounds: Normal heart sounds. No murmur heard.   Pulmonary:     Comments: Coarse, with a little decreased breath sounds at bases No W/R/R-  Abdominal:     Comments: Soft, NT, ND, (+)BS -hypoactive  Genitourinary:    General: Normal vulva.  Musculoskeletal:     Cervical back: Normal range of motion. No rigidity.     Comments: LUE -couldn't comply with exam, but  spontaneously, moving R and L LEs- pulling knees to her chest.  At least 3/5 in L side With automatic movement, R appears weaker than L  Skin:    Comments: No skin breakdown seen IV in place- looks OK Has yogurt or something on phone and surrounding parts of bed- really spread out, of note.   Neurological:     Comments: Patient is awake and alert.  Follows some commands and nods heads to simple questions.  Oriented to her name.  Didn't follow my exam or respond much to my exam- maybe was tired? However answered <40% of yes/no questions. Appeared to have increased tone? MAS 1+- catch on R wrist; and RUE   Psychiatric:     Comments: Confused; aphasic     Results for orders placed or performed during the hospital encounter of 12/07/20 (from the past 24 hour(s))  CBC     Status: Abnormal   Collection Time: 12/17/20  2:27 AM  Result Value Ref Range   WBC 9.4 4.0 - 10.5 K/uL   RBC 3.55 (L) 3.87 -  5.11 MIL/uL   Hemoglobin 10.9 (L) 12.0 - 15.0 g/dL   HCT 33.0 (L) 36.0 - 46.0 %   MCV 93.0 80.0 - 100.0 fL   MCH 30.7 26.0 - 34.0 pg   MCHC 33.0 30.0 - 36.0 g/dL   RDW 13.6 11.5 - 15.5 %   Platelets 250 150 - 400 K/uL   nRBC 0.0 0.0 - 0.2 %  Heparin level (unfractionated)     Status: None   Collection Time: 12/17/20  2:27 AM  Result Value Ref Range   Heparin Unfractionated 0.39 0.30 - 0.70 IU/mL  Protime-INR     Status: None   Collection Time: 12/17/20  2:27 AM  Result Value Ref Range   Prothrombin Time 13.8 11.4 - 15.2 seconds   INR 1.1 0.8 - 1.2  Basic metabolic panel     Status: Abnormal   Collection Time: 12/17/20  2:27 AM  Result Value Ref Range   Sodium 140 135 - 145 mmol/L   Potassium 2.7 (LL) 3.5 - 5.1 mmol/L   Chloride 100 98 - 111 mmol/L   CO2 27 22 - 32 mmol/L   Glucose, Bld 104 (H) 70 - 99 mg/dL   BUN 12 8 - 23 mg/dL   Creatinine, Ser 0.95 0.44 - 1.00 mg/dL   Calcium 8.6 (L) 8.9 - 10.3 mg/dL   GFR, Estimated >60 >60 mL/min   Anion gap 13 5 - 15   DG Swallowing  Func-Speech Pathology  Result Date: 12/16/2020 Objective Swallowing Evaluation: Type of Study: MBS-Modified Barium Swallow Study  Patient Details Name: Mindy Mills MRN: [id number] Date of Birth: 1951-02-20 Today's Date: 12/16/2020 Time: SLP Start Time (ACUTE ONLY): 0818 -SLP Stop Time (ACUTE ONLY): 0833 SLP Time Calculation (min) (ACUTE ONLY): 15 min Past Medical History: Past Medical History: Diagnosis Date . Cancer ([place])   Cervicle . Full dentures  . Hepatitis C   denies . Hypertension  . Migraine  . Myocardial infarction ([place])  . Stroke [facility]) 2015  Pt reports hx of CVA x3.  Last one in 2015 . Wears glasses  . Wears hearing aid in both ears  Past Surgical History: Past Surgical History: Procedure Laterality Date . ABDOMINAL HYSTERECTOMY   . BUNIONECTOMY   . COLONOSCOPY   . MULTIPLE TOOTH EXTRACTIONS   . OPEN REDUCTION INTERNAL FIXATION (ORIF) DISTAL RADIAL FRACTURE Right 09/07/2018  Procedure: OPEN REDUCTION INTERNAL FIXATION (ORIF) DISTAL RADIAL FRACTURE;  Surgeon: Iran Planas, MD;  Location: [place];  Service: Orthopedics;  Laterality: Right; . PEG PLACEMENT N/A 09/21/2014  Procedure: PERCUTANEOUS ENDOSCOPIC GASTROSTOMY (PEG) PLACEMENT (BED SIDE);  Surgeon: Georganna Skeans, MD;  Location: [place];  Service: General;  Laterality: N/A; . RADIOLOGY WITH ANESTHESIA N/A 09/11/2014  Procedure: Sub Arachnoid Aneurysm Coiling -RADIOLOGY WITH ANESTHESIA ;  Surgeon: Consuella Lose, MD;  Location: [place];  Service: Radiology;  Laterality: N/A; HPI: Mindy Mills is a 69 y.o. female with medical history significant of migraines, intracerebral hemorrhage in 2015. ed. Admitted to [facility] with shortness of breath and cough. Found to have suspected aspiration pneumonia. Chest CT debris filling the right lower lobe bronchus and its branches highly suggestive of acute aspiration; large hiatal hernia. BSE 12/13 rec'd Dys 3/thin. On 12/17 pt noted to have acute aphasia>code stroke. MRI acute moderate left MCA infarct, small  acute infarct right cerebellum. Repeat BSE at [facility] recommended Dys 1/honey thick and pt transported to [facility].  Subjective: alert, not vocalizing Assessment / Plan / Recommendation CHL IP CLINICAL IMPRESSIONS 12/16/2020 Clinical Impression  Pt presents with a moderate oral more than pharyngeal dysphagia, with impaired timing and coordination primarily impacting safety with swallowing. She has slow oral transit with reduced bolus cohesion, premature spillage to the valleculae and pyriform sinuses, and oral residue. She will intermittently perform a second, spontaneous swallow to reduce oral residue. Impaired timing, as well as reduced hyolaryngeal movement and laryngeal closure, result in penetration and aspiration with nectar thick and thin liquids, respectively. Pt had minimal coughing during testing, coughing x1 before any airway invasion had occurred, and again x1 after aspiration but this was ineffective. Recommend continuing Dys 1 diet and honey thick liquids with good potential for upgrade as oral control improves. SLP Visit Diagnosis Dysphagia, oropharyngeal phase (R13.12) Attention and concentration deficit following -- Frontal lobe and executive function deficit following -- Impact on safety and function Moderate aspiration risk   CHL IP TREATMENT RECOMMENDATION 12/16/2020 Treatment Recommendations Therapy as outlined in treatment plan below   Prognosis 12/16/2020 Prognosis for Safe Diet Advancement Good Barriers to Reach Goals Severity of deficits;Language deficits Barriers/Prognosis Comment -- CHL IP DIET RECOMMENDATION 12/16/2020 SLP Diet Recommendations Dysphagia 1 (Puree) solids;Honey thick liquids Liquid Administration via Cup Medication Administration Crushed with puree Compensations Small sips/bites;Lingual sweep for clearance of pocketing;Monitor for anterior loss Postural Changes Seated upright at 90 degrees;Remain semi-upright after after feeds/meals (Comment)   CHL IP OTHER RECOMMENDATIONS  12/16/2020 Recommended Consults -- Oral Care Recommendations Oral care before and after PO;Oral care BID Other Recommendations --   CHL IP FOLLOW UP RECOMMENDATIONS 12/16/2020 Follow up Recommendations Inpatient Rehab   CHL IP FREQUENCY AND DURATION 12/16/2020 Speech Therapy Frequency (ACUTE ONLY) min 2x/week Treatment Duration 2 weeks      CHL IP ORAL PHASE 12/16/2020 Oral Phase Impaired Oral - Pudding Teaspoon -- Oral - Pudding Cup -- Oral - Honey Teaspoon -- Oral - Honey Cup Reduced posterior propulsion;Lingual/palatal residue;Delayed oral transit;Decreased bolus cohesion;Premature spillage Oral - Nectar Teaspoon -- Oral - Nectar Cup Reduced posterior propulsion;Lingual/palatal residue;Delayed oral transit;Decreased bolus cohesion;Premature spillage Oral - Nectar Straw -- Oral - Thin Teaspoon -- Oral - Thin Cup Reduced posterior propulsion;Lingual/palatal residue;Delayed oral transit;Decreased bolus cohesion;Premature spillage Oral - Thin Straw Reduced posterior propulsion;Lingual/palatal residue;Delayed oral transit;Decreased bolus cohesion;Premature spillage Oral - Puree Reduced posterior propulsion;Lingual/palatal residue;Delayed oral transit;Decreased bolus cohesion Oral - Mech Soft -- Oral - Regular -- Oral - Multi-Consistency -- Oral - Pill -- Oral Phase - Comment --  CHL IP PHARYNGEAL PHASE 12/16/2020 Pharyngeal Phase Impaired Pharyngeal- Pudding Teaspoon -- Pharyngeal -- Pharyngeal- Pudding Cup -- Pharyngeal -- Pharyngeal- Honey Teaspoon -- Pharyngeal -- Pharyngeal- Honey Cup Delayed swallow initiation-pyriform sinuses;Reduced anterior laryngeal mobility;Reduced laryngeal elevation;Reduced airway/laryngeal closure;Delayed swallow initiation-vallecula Pharyngeal -- Pharyngeal- Nectar Teaspoon -- Pharyngeal -- Pharyngeal- Nectar Cup Delayed swallow initiation-pyriform sinuses;Reduced anterior laryngeal mobility;Reduced laryngeal elevation;Reduced airway/laryngeal closure;Penetration/Aspiration during  swallow;Penetration/Aspiration before swallow Pharyngeal Material enters airway, CONTACTS cords and not ejected out Pharyngeal- Nectar Straw -- Pharyngeal -- Pharyngeal- Thin Teaspoon -- Pharyngeal -- Pharyngeal- Thin Cup Delayed swallow initiation-pyriform sinuses;Reduced anterior laryngeal mobility;Reduced laryngeal elevation;Reduced airway/laryngeal closure;Penetration/Aspiration during swallow;Penetration/Aspiration before swallow Pharyngeal Material enters airway, passes BELOW cords without attempt by patient to eject out (silent aspiration) Pharyngeal- Thin Straw Delayed swallow initiation-pyriform sinuses;Reduced anterior laryngeal mobility;Reduced laryngeal elevation;Reduced airway/laryngeal closure;Penetration/Aspiration before swallow;Penetration/Aspiration during swallow Pharyngeal Material enters airway, passes BELOW cords and not ejected out despite cough attempt by patient Pharyngeal- Puree Delayed swallow initiation-pyriform sinuses;Reduced anterior laryngeal mobility;Reduced laryngeal elevation;Reduced airway/laryngeal closure Pharyngeal -- Pharyngeal- Mechanical Soft -- Pharyngeal -- Pharyngeal- Regular -- Pharyngeal -- Pharyngeal-  Multi-consistency -- Pharyngeal -- Pharyngeal- Pill -- Pharyngeal -- Pharyngeal Comment --  CHL IP CERVICAL ESOPHAGEAL PHASE 12/16/2020 Cervical Esophageal Phase WFL Pudding Teaspoon -- Pudding Cup -- Honey Teaspoon -- Honey Cup -- Nectar Teaspoon -- Nectar Cup -- Nectar Straw -- Thin Teaspoon -- Thin Cup -- Thin Straw -- Puree -- Mechanical Soft -- Regular -- Multi-consistency -- Pill -- Cervical Esophageal Comment -- Osie Bond., M.A. CCC-SLP Acute Rehabilitation Services Pager [phone] Office [phone] 12/16/2020, 10:05 AM                Assessment/Plan: Diagnosis: L MCA stroke with aphasia, and R hemiparesis; K+ 2.7, EF 25-30% apical thrombus and L heart cath next week 1. Does the need for close, 24 hr/day medical supervision in concert with the patient's  rehab needs make it unreasonable for this patient to be served in a less intensive setting? Potentially 2. Co-Morbidities requiring supervision/potential complications: D1 honey thick diet; acute encephalopathy, on Heparin gtt; L MCA stroke, CHF, COPD, on 3L O2 3. Due to bladder management, bowel management, safety, skin/wound care, disease management, medication administration, pain management and patient education, does the patient require 24 hr/day rehab nursing? Potentially 4. Does the patient require coordinated care of a physician, rehab nurse, therapy disciplines of PT/OT/SLP to address physical and functional deficits in the context of the above medical diagnosis(es)? Potentially Addressing deficits in the following areas: balance, endurance, locomotion, strength, transferring, bowel/bladder control, bathing, dressing, feeding, grooming, toileting, cognition, speech, language and swallowing 5. Can the patient actively participate in an intensive therapy program of at least 3 hrs of therapy per day at least 5 days per week? Potentially 6. The potential for patient to make measurable gains while on inpatient rehab is fair 7. Anticipated functional outcomes upon discharge from inpatient rehab are n/a  with PT, n/a with OT, n/a with SLP. 8. Estimated rehab length of stay to reach the above functional goals is: questionable admission 9. Anticipated discharge destination: Home 10. Overall Rehab/Functional Prognosis: fair  RECOMMENDATIONS: This patient's condition is appropriate for continued rehabilitative care in the following setting: CIR and SNF Patient has agreed to participate in recommended program. Potentially Note that insurance prior authorization may be required for reimbursement for recommended care.  Comment:  1. Not sure how pt could decline SNF- she was very confused, and not able to comply with exam? We will have to see how well pt can participate in the next few days to see if  she is able to participate in 3 hours/day- and if she has family to care for her 24/7 at home- unlikely to be able to go home at mod I.  2. Has L heart cath scheduled next week.  3. Could try Amantadine 100 mg daily x 4 days, then 200 mg daily for aphasia. Doesn't always help, but worth a try.  4. Appeared like was either fighting me or has increased tone in R side, which is a poor indicator for overall prognosis. If it's Spasticity, would try Dantrolene 50 mg QHS for now- less sedating than baclofen.  5. Will d/w admissions coordinators- 6. Thank you for this consult.      Lavon Paganini Angiulli, PA-C 12/17/2020    I have personally performed a face to face diagnostic evaluation of this patient and formulated the key components of the plan.  Additionally, I have personally reviewed laboratory data, imaging studies, as well as relevant notes and concur with the physician assistant's documentation above.

## 2020-12-17 NOTE — Evaluation (Signed)
Occupational Therapy Evaluation Patient Details Name: Mindy Mills MRN: [id number] DOB: 26-Dec-1951 Today's Date: 12/17/2020    History of Present Illness Pt adm to [facility] on 12/07/20 with acute systolic heart failure, aspiration PNA, apical thrombus, and acute respiratory failure with hypoxia. On 12/17 developed sudden onset of aphasia, altered mental status, and rt sided weakness. Code stroke called and pt found to have acute left MCA infarct. Transferred to [facility] on 12/18. PMH - SAH s/p coiling 2015, copd, chf, htn, migraine.   Clinical Impression   Pt with expressive aphasia (suspect some receptive as well) and so PLOF taken from [facility] PT note. Today Pt does make attempts to communicate, but most questions done through yes and no and head shakes/nods. Some R inattention noted throughout session, or potentially receptive difficulties?! Pt was min A for bed mobility and SPT to the recliner from the bed. Pt incontinent of stool and was able to follow commands prior to transfer to assist with clean up at the bed level. Strength in RUE grossly 3/5 with limited ROM at digits, wrist, shoulder. Pt will benefit from skilled OT in the acute setting and at the CIR level to maximize safety and independence in ADL and functional transfers.   Of note: Pt completed OT session on RA and the lowest saturation was 90%, returned to 2L O2 at end of session while sitting in the recliner.  BP initially in recliner: 92/42 (62) BP after sitting for 3 min in recliner: 105/57 (71)    Follow Up Recommendations  CIR    Equipment Recommendations  3 in 1 bedside commode;Other (comment) (defer to next venue of care)    Recommendations for Other Services Rehab consult     Precautions / Restrictions Precautions Precautions: Fall Restrictions Weight Bearing Restrictions: No      Mobility Bed Mobility Overal bed mobility: Needs Assistance Bed Mobility: Supine to Sit     Supine to sit: Min assist     General  bed mobility comments: Assist to elevate trunk into sitting    Transfers Overall transfer level: Needs assistance Equipment used: 1 person hand held assist;Rolling walker (2 wheeled) Transfers: Sit to/from [facility] Sit to Stand: Min assist Stand pivot transfers: Min assist       General transfer comment: Assist to bring hips up and for balance. Hand held assist for bed to chair    Balance Overall balance assessment: Needs assistance Sitting-balance support: No upper extremity supported;Feet supported Sitting balance-Leahy Scale: Fair     Standing balance support: Single extremity supported;During functional activity Standing balance-Leahy Scale: Poor Standing balance comment: UE support                           ADL either performed or assessed with clinical judgement   ADL Overall ADL's : Needs assistance/impaired Eating/Feeding: Minimal assistance Eating/Feeding Details (indicate cue type and reason): to open containers Grooming: Minimal assistance;Sitting;Wash/dry face;Wash/dry Nurse, mental health Details (indicate cue type and reason): cues to attend to R side, R hand, does not engage in Bil tasks with BUE Upper Body Bathing: Moderate assistance   Lower Body Bathing: Moderate assistance   Upper Body Dressing : Moderate assistance   Lower Body Dressing: Moderate assistance Lower Body Dressing Details (indicate cue type and reason): able to don socks sitting EOB with min guard Toilet Transfer: Minimal assistance;Stand-pivot Toilet Transfer Details (indicate cue type and reason): face to face Toileting- Clothing Manipulation and Hygiene: Maximal assistance;Bed level Toileting -  Clothing Manipulation Details (indicate cue type and reason): incontinent of stool     Functional mobility during ADLs: Minimal assistance (SPT only) General ADL Comments: decreased direction following, R inattention, expressive difficulties     Vision   Vision  Assessment?: Vision impaired- to be further tested in functional context Additional Comments: continue to monitor     Perception Perception Comments: needs to be further evaluated in functional tasks   Praxis      Pertinent Vitals/Pain Pain Assessment: Faces Faces Pain Scale: No hurt Pain Intervention(s): Monitored during session     Hand Dominance Right   Extremity/Trunk Assessment Upper Extremity Assessment Upper Extremity Assessment: RUE deficits/detail RUE Deficits / Details: strength 3/5, motor coordination deficits, unable to bring hand to head RUE Sensation:  (Pt reports WNL some inattention noted throughout session) RUE Coordination: decreased fine motor;decreased gross motor   Lower Extremity Assessment Lower Extremity Assessment: Defer to PT evaluation   Cervical / Trunk Assessment Cervical / Trunk Assessment: Normal   Communication Communication Communication: Expressive difficulties;Receptive difficulties (receptive is largely intact,)   Cognition Arousal/Alertness: Awake/alert Behavior During Therapy: Impulsive;WFL for tasks assessed/performed Overall Cognitive Status: Difficult to assess                                 General Comments: Pt nonverbal and not nodding/shaking head consistently. Followed 1 step commands ~80% of the time.   General Comments  Pt was on 3L O2 when OT entered the room, she was able to complete entire OT session on RA and when checked, SpO2 90 or above. When seated in chair, Pt at 90% and so placed back on 2L; BP once up in chair was: 92/42 (62), and 105/57 (71)    Exercises     Shoulder Instructions      Home Living Family/patient expects to be discharged to:: Private residence Living Arrangements: Non-relatives/Friends Available Help at Discharge: Friend(s);Available 24 hours/day Type of Home: Mobile home Home Access: Stairs to enter [employer] of Steps: 4 Entrance Stairs-Rails: Right;Left;Can  reach both Home Layout: One level     Bathroom Shower/Tub: Teacher, early years/pre: Standard Bathroom Accessibility: Yes   Home Equipment: Cane - single point          Prior Functioning/Environment Level of Independence: Independent with assistive device(s)        Comments: Hydrographic surveyor using SPC, drives        OT Problem List: Decreased strength;Decreased activity tolerance;Decreased range of motion;Impaired balance (sitting and/or standing);Impaired vision/perception;Decreased coordination;Decreased cognition;Decreased safety awareness;Decreased knowledge of precautions;Impaired UE functional use      OT Treatment/Interventions: Therapeutic exercise;Self-care/ADL training;Neuromuscular education;DME and/or AE instruction;Therapeutic activities;Cognitive remediation/compensation;Visual/perceptual remediation/compensation;Patient/family education;Balance training    OT Goals(Current goals can be found in the care plan section) Acute Rehab OT Goals Patient Stated Goal: Pt unable to state due to aphasia OT Goal Formulation: Patient unable to participate in goal setting Time For Goal Achievement: 12/31/20 Potential to Achieve Goals: Good ADL Goals Pt Will Perform Grooming: with set-up;standing Pt Will Perform Upper Body Dressing: with modified independence;sitting Pt Will Perform Lower Body Dressing: sit to/from stand;with supervision Pt Will Transfer to Toilet: with supervision;ambulating Pt Will Perform Toileting - Clothing Manipulation and hygiene: with modified independence;sit to/from stand Pt/caregiver will Perform Home Exercise Program: Right Upper extremity;Independently;With written HEP provided  OT Frequency: Min 2X/week   Barriers to D/C:  Co-evaluation              AM-PAC OT "6 Clicks" Daily Activity     Outcome Measure Help from another person eating meals?: A Lot Help from another person taking care of personal grooming?:  A Lot Help from another person toileting, which includes using toliet, bedpan, or urinal?: A Lot Help from another person bathing (including washing, rinsing, drying)?: A Lot Help from another person to put on and taking off regular upper body clothing?: A Lot Help from another person to put on and taking off regular lower body clothing?: A Lot 6 Click Score: 12   End of Session Equipment Utilized During Treatment: Gait belt;Oxygen (2L at end of session) Nurse Communication: Mobility status;Precautions  Activity Tolerance: Patient tolerated treatment well Patient left: in chair;with call bell/phone within reach;with chair alarm set  OT Visit Diagnosis: Unsteadiness on feet (R26.81);Other abnormalities of gait and mobility (R26.89);Muscle weakness (generalized) (M62.81);Other symptoms and signs involving cognitive function;Other symptoms and signs involving the nervous system (R29.898);Hemiplegia and hemiparesis Hemiplegia - Right/Left: Right Hemiplegia - dominant/non-dominant: Dominant Hemiplegia - caused by: Cerebral infarction                Time: 9574-7340 OT Time Calculation (min): 33 min Charges:  OT General Charges $OT Visit: 1 Visit OT Evaluation $OT Eval Moderate Complexity: 1 Mod OT Treatments $Self Care/Home Management : 8-22 mins  [name] OTR/L Acute Rehabilitation Services Pager: [phone] Office: [place] 12/17/2020, 1:35 PM

## 2020-12-17 NOTE — Plan of Care (Signed)
  Problem: Clinical Measurements: Goal: Ability to maintain clinical measurements within normal limits will improve Outcome: Progressing Goal: Will remain free from infection Outcome: Progressing Goal: Diagnostic test results will improve Outcome: Progressing Goal: Respiratory complications will improve Outcome: Progressing Goal: Cardiovascular complication will be avoided Outcome: Progressing   Problem: Activity: Goal: Risk for activity intolerance will decrease Outcome: Progressing   Problem: Nutrition: Goal: Adequate nutrition will be maintained Outcome: Progressing   Problem: Coping: Goal: Level of anxiety will decrease Outcome: Progressing   Problem: Elimination: Goal: Will not experience complications related to bowel motility Outcome: Progressing   Problem: Pain Managment: Goal: General experience of comfort will improve Outcome: Progressing   Problem: Safety: Goal: Ability to remain free from injury will improve Outcome: Progressing   Problem: Skin Integrity: Goal: Risk for impaired skin integrity will decrease Outcome: Progressing   

## 2020-12-17 NOTE — Progress Notes (Signed)
CRITICAL VALUE ALERT  Critical Value:  K 2.7  Date & Time Notied: 12/21 0345  Provider Notified: [facility]  Orders Received/Actions taken: awaiting new orders

## 2020-12-17 NOTE — Progress Notes (Signed)
  Speech Language Pathology Treatment: Dysphagia;Cognitive-Linquistic  Patient Details Name: Mindy Mills MRN: [id number] DOB: September 12, 1951 Today's Date: 12/17/2020 Time: 4431-5400 SLP Time Calculation (min) (ACUTE ONLY): 15 min  Assessment / Plan / Recommendation Clinical Impression   Pt seems to be more frustrated today, but trying to communicate more even though what she says is not intelligible. SLP attempted having her write, but she would only draw up to a few lines at a time before throwing the pen down. Also tried to have her differentiate between written prompts ("yes" and "no") but without good accuracy (<50%). Pt consumed honey thick liquids via straw with a productive cough x1 after her initial sip, not observed again during session. Pt became increasingly frustrated so session was ended. Will continue to follow, recommending CIR.    HPI HPI: Mindy Mills is a 69 y.o. female with medical history significant of migraines, intracerebral hemorrhage in 2015. ed. Admitted to [facility] with shortness of breath and cough. Found to have suspected aspiration pneumonia. Chest CT debris filling the right lower lobe bronchus and its branches highly suggestive of acute aspiration; large hiatal hernia. BSE 12/13 rec'd Dys 3/thin. On 12/17 pt noted to have acute aphasia>code stroke. MRI acute moderate left MCA infarct, small acute infarct right cerebellum. Repeat BSE at [facility] recommended Dys 1/honey thick and pt transported to [facility].      SLP Plan  Continue with current plan of care       Recommendations  Diet recommendations: Dysphagia 1 (puree);Honey-thick liquid Liquids provided via: Cup;Straw Medication Administration: Whole meds with puree Supervision: Staff to assist with self feeding;Full supervision/cueing for compensatory strategies;Patient able to self feed Compensations: Small sips/bites;Lingual sweep for clearance of pocketing;Monitor for anterior loss Postural Changes and/or Swallow  Maneuvers: Seated upright 90 degrees                Oral Care Recommendations: Oral care BID Follow up Recommendations: Inpatient Rehab SLP Visit Diagnosis: Dysphagia, oropharyngeal phase (R13.12) Plan: Continue with current plan of care       GO                Osie Bond., M.A. [place] Acute Rehabilitation Services Pager [phone] Office [phone]  12/17/2020, 3:31 PM

## 2020-12-17 NOTE — Progress Notes (Signed)
Rehab Admissions Coordinator Note:  Patient was screened by Cleatrice Burke for appropriateness for an Inpatient Acute Rehab Consult per therapy change in recommendation.  At this time, we are recommending Inpatient Rehab consult. I will place order per protocol.  Cleatrice Burke RN MSN 12/17/2020, 8:54 AM  I can be reached at [phone].

## 2020-12-17 NOTE — Progress Notes (Signed)
STROKE TEAM PROGRESS NOTE      INTERVAL HISTORY Her family is not at the bedside.  Patient is lying comfortably in bed.  Neurological exam is stable.  Vital signs are stable.  Blood pressure adequately controlled.   She has been started on IV heparin and level this morning is 0.39.  INR is 1.1 and warfarin has been started  OBJECTIVE Vitals:   12/16/20 2056 12/16/20 2103 12/16/20 2335 12/17/20 0437  BP: (!) 118/58  115/60 125/62  Pulse: (!) 59  (!) 57 60  Resp: 19  16 18   Temp: 99.5 F (37.5 C)  98.4 F (36.9 C) 99 F (37.2 C)  TempSrc: Oral  Oral Oral  SpO2:  98% 98%   Weight:    44.8 kg  Height:        CBC:  Recent Labs  Lab 12/16/20 0334 12/17/20 0227  WBC 10.8* 9.4  HGB 11.5* 10.9*  HCT 36.0 33.0*  MCV 94.2 93.0  PLT 244 [id number]    Basic Metabolic Panel:  Recent Labs  Lab 12/14/20 0426 12/15/20 0028 12/16/20 0334 12/17/20 0227  NA 142 141 141 140  K 3.7 3.5 3.4* 2.7*  CL 107 103 102 100  CO2 27 25 27 27   GLUCOSE 82 97 107* 104*  BUN 12 11 13 12   CREATININE 1.00  1.02* 1.18* 1.02* 0.95  CALCIUM 8.5* 8.5* 8.6* 8.6*  MG 2.1 1.7  --   --     Lipid Panel:     Component Value Date/Time   CHOL 199 12/16/2020 0334   TRIG 91 12/16/2020 0334   HDL 44 12/16/2020 0334   CHOLHDL 4.5 12/16/2020 0334   VLDL 18 12/16/2020 0334   LDLCALC 137 (H) 12/16/2020 0334   HgbA1c:  Lab Results  Component Value Date   HGBA1C 5.2 12/16/2020   Urine Drug Screen:     Component Value Date/Time   LABOPIA NONE DETECTED 09/15/2014 2208   COCAINSCRNUR NONE DETECTED 09/15/2014 2208   LABBENZ NONE DETECTED 09/15/2014 2208   AMPHETMU NONE DETECTED 09/15/2014 2208   THCU POSITIVE (A) 09/15/2014 2208   LABBARB NONE DETECTED 09/15/2014 2208    Alcohol Level     Component Value Date/Time   ETH <10 08/31/2018 1632    IMAGING  CT Code Stroke CTA Head W/WO contrast CT Code Stroke CTA Neck W/WO contrast CT Code Stroke Cerebral Perfusion with contrast 12/13/2020 IMPRESSION:   1. Left M2 superior division occlusion.  2. Associated left MCA territory infarct without evidence of a significant penumbra on CTP.  3. Patent carotid and vertebral arteries. Loop in the proximal left ICA with focal kinking versus a small web.  4. Previous anterior communicating aneurysm coiling.  5. Aortic Atherosclerosis (ICD10-I70.0) and Emphysema (ICD10-J43.9).   CT HEAD WO CONTRAST 12/14/2020 IMPRESSION:  1. The known left frontal infarct is again identified. The overall size is similar in the interval. No gross hemorrhagic transformation. Possible early petechial blood not excluded.  2. No other acute abnormalities.   CT HEAD WO CONTRAST 12/13/2020 IMPRESSION:  1. Loss of gray-white differentiation affecting the left frontal operculum consistent with the region of acute infarction shown by MRI. Overall, this measures approximately 5-6 cm in size. No gross hemorrhagic transformation. Possible early petechial blood hyperdensity in the anterior portion of the infarction. No mass effect or shift.  2. 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  3. Chronic small-vessel ischemic changes of the white matter. Old right frontal cortical  and subcortical infarction.   MR BRAIN WO CONTRAST 12/13/2020 IMPRESSION:  Acute moderate size left MCA territory infarct. Discontiguous small acute infarct of the left caudate. Third small acute infarct of the right cerebellum.   EEG adult 12/14/2020 ABNORMALITY -Continued slow, left hemisphere, maximal left frontal region  IMPRESSION:  This study is suggestive of cortical dysfunction in left hemisphere, maximal left frontal region secondary to underlying structural abnormality/stroke. No seizures or definite epileptiform discharges were seen throughout the recording.   CT HEAD CODE STROKE WO CONTRAST 12/13/2020 IMPRESSION:  1. Suspected acute left MCA infarct involving the insula and possibly operculum. No hemorrhage.  2.  ASPECTS is 8-9. 3. Moderate chronic small vessel ischemic disease. Small chronic right parietal infarct.   Transthoracic Echocardiogram  12/09/2020 IMPRESSIONS  1. The mid to distal anteroseptal,anterior and anterolateral walls are  hypokinetic. The apex is hypokinetic. There is a 0.7 x 1.3 cm mural apical  thrombus. . Left ventricular ejection fraction, by estimation, is 25 to  30%. The left ventricle has severely decreased function. The left ventricle demonstrates regional wall  motion abnormalities (see scoring diagram/findings for description). There  is moderate left ventricular hypertrophy. Left ventricular diastolic  parameters are consistent with  Grade II diastolic dysfunction (pseudonormalization). Elevated left atrial  pressure.  2. Right ventricular systolic function is normal. The right ventricular  size is normal.  3. Left atrial size was mildly dilated.  4. The mitral valve is normal in structure. Trivial mitral valve  regurgitation. No evidence of mitral stenosis. Moderate mitral annular  calcification.  5. The aortic valve was not well visualized. There is mild calcification  of the aortic valve. There is mild thickening of the aortic valve. Aortic  valve regurgitation is not visualized. No aortic stenosis is present.  6. The inferior vena cava is normal in size with greater than 50%  respiratory variability, suggesting right atrial pressure of 3 mmHg.   ECG - SR rate 77 BPM. (See cardiology reading for complete details)   PHYSICAL EXAM Blood pressure 125/62, pulse 60, temperature 99 F (37.2 C), temperature source Oral, resp. rate 18, height 5\' 3"  (1.6 m), weight 44.8 kg, SpO2 98 %.   Frail elderly female, NAD, normocephalic. . Afebrile. Head is nontraumatic. Neck is supple without bruit.    Cardiac exam no murmur or gallop. Lungs are clear to auscultation. Distal pulses are well felt.  Neurological Exam : awake and alert, nods when asked questions apears  appropriate(nods yes to more applesauce) , follows simple commands, oriented to her name. Blinks to threat bilat, PERRL, EOMI, facial NL flattening and left lower weakness, hearing impaired, tongue midline,  toes down going. Can hold up arms to gravity without drift, will not follow commands on lower extremities but w/d to stim x 4.     ASSESSMENT/PLAN Mindy Mills is a 69 y.o. 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 She did not receive IV t-PA due to IV Heparin therapy - PTT 89.  Stroke: Acute moderate size left MCA territory infarct. Discontiguous small acute infarct of the left caudate. Third small acute infarct of the right cerebellum - embolic - likely 2/2 known apical thrombus LV EF 25-30%   Code Stroke CT Head - Suspected acute left MCA infarct involving the insula and possibly operculum. No hemorrhage. ASPECTS is 8-9. Moderate chronic small vessel ischemic disease. Small chronic right parietal infarct.     CT head - The known left frontal infarct is again identified. The overall size is similar in the interval. No gross hemorrhagic transformation. Possible early petechial blood not excluded.   MRI head - Acute moderate size left MCA territory infarct. Discontiguous small acute infarct of the left caudate. Third small acute infarct of the right cerebellum.   MRA head - not ordered  CTA H&N - Left M2 superior division  occlusion. Patent carotid and vertebral arteries. Loop in the proximal left ICA with focal kinking versus a small web. Previous anterior communicating aneurysm coiling.  CT Perfusion - Associated left MCA territory infarct without evidence of a significant penumbra on CTP.   Carotid Doppler - CTA neck ordered - carotid dopplers not indicated.  EEG - This study is suggestive of cortical dysfunction in left hemisphere, maximal left frontal region secondary to underlying structural abnormality/stroke. No seizures or definite epileptiform discharges were seen.  2D Echo - 12/09/20 -  There is a 0.7 x 1.3 cm mural apical thrombus. . Left ventricular ejection fraction, by estimation, is 25 to 30%.   Sars Corona Virus 2 - negative  LDL -137  HgbA1c - 5.2  UDS - not ordered  VTE prophylaxis - Heparin IV Diet  Diet Order            DIET - DYS 1 Room service appropriate? Yes; Fluid consistency: Honey Thick  Diet effective now                 heparin IV (aspirin 325 mg daily PTA - [facility]) prior to admission, now on aspirin 300 mg suppository daily and heparin IV. Continue heparin for LV thrombus, on IV heparin, will be transitioned to coumadin. From a stroke perspective moderate-sized stroke would wait at least 5-7 days before anticoagulating with coumadin, team to weigh in on Monday  Patient will be counseled to be compliant with her antithrombotic medications  Ongoing aggressive stroke risk factor management  Therapy recommendations:  CLR  Disposition:  Pending  Hypertension  Home BP meds: none   Current BP meds: metoprolol  Stable . Permissive hypertension (avoid extreme htn with low EF) but gradually normalize in 5-7 days . Long-term BP goal normotensive  Hyperlipidemia  Home Lipid lowering medication: Crestor 40 mg daily (pt was not taking)  LDL pending, goal < 70  Current lipid lowering medication: none   Continue statin at discharge  Other Stroke Risk  Factors  Advanced age  Former cigarette smoker - quit > 5 years ago  Previous ETOH use  Hx stroke/TIA  Coronary artery disease  Migraines  Congestive Heart Failure - EF 25 - 30%  Known apical thrombus  Other Active Problems, Findings and Recommendations  Code status - DNR  Aspiration pneumonia -> Unasyn (blood cultures NG x 5 days) (urine culture pending)  Aortic Atherosclerosis (ICD10-I70.0)  Emphysema (ICD10-J43.9)  CKD - stage 2 - creatinine - 1.18  Known apical thrombus, 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  Recommend continue IV heparin stroke protocol bridge and warfarin for stroke prevention given left ventricular mural clot and high risk for recurrent stroke.  Stop IV heparin when INR is close to 2 with target INR goal between 2 and 3.  She may need repeat echocardiogram in 3 to 4 months to see if LV clot has resolved at that point she may be switched from warfarin back to aspirin.  Stroke team will sign off.  Follow-up as an outpatient stroke clinic in 6 to 8 weeks.  No family available for discussion.  Discussed with Dr. Benny Lennert.  I spent 25 minutes of face-to-face and non-face-to-face time with patient. This included prechart review, lab review, study review, order entry, electronic health record documentation, patient education on the different diagnostic and therapeutic options, counseling and coordination of care, risks and benefits of management, compliance, or risk factor reduction Antony Contras, MD  To contact Stroke Continuity provider, please refer to [email or web address] After hours, contact General Neurology

## 2020-12-17 NOTE — Progress Notes (Signed)
Physical Therapy Treatment Patient Details Name: Mindy Mills MRN: [id number] DOB: 05-06-1951 Today's Date: 12/17/2020    History of Present Illness Pt adm to [facility] on 12/07/20 with acute systolic heart failure, aspiration PNA, apical thrombus, and acute respiratory failure with hypoxia. On 12/17 developed sudden onset of aphasia, altered mental status, and rt sided weakness. Code stroke called and pt found to have acute left MCA infarct. Transferred to [facility] on 12/18. PMH - SAH s/p coiling 2015, copd, chf, htn, migraine.    PT Comments    Pt limited today by frequent bouts of diarrhea. Limited to chair to bed for that reason. Once that is controlled expect pt to make good progress.   Follow Up Recommendations  CIR     Equipment Recommendations  Rolling walker with 5" wheels    Recommendations for Other Services       Precautions / Restrictions Precautions Precautions: Fall Restrictions Weight Bearing Restrictions: No    Mobility  Bed Mobility Overal bed mobility: Needs Assistance Bed Mobility: Sit to Supine;Rolling Rolling: Min guard   Supine to sit: Min assist Sit to supine: Min assist   General bed mobility comments: Assist to bring legs back up into bed returning to supine. Assist for lines and safety when pt rolling.  Transfers Overall transfer level: Needs assistance Equipment used: 1 person hand held assist;Rolling walker (2 wheeled) Transfers: Sit to/from [facility] Sit to Stand: Min assist Stand pivot transfers: Min assist       General transfer comment: Assist to bring hips up and for balance. Hand held assist for chair to bed.  Ambulation/Gait             General Gait Details: Unable due to frequent incontinence of diarrhea.   Stairs             Wheelchair Mobility    Modified Rankin (Stroke Patients Only) Modified Rankin (Stroke Patients Only) Pre-Morbid Rankin Score: No symptoms Modified Rankin: Moderately severe  disability     Balance Overall balance assessment: Needs assistance Sitting-balance support: No upper extremity supported;Feet supported Sitting balance-Leahy Scale: Fair     Standing balance support: Single extremity supported;During functional activity Standing balance-Leahy Scale: Poor Standing balance comment: UE support                            Cognition Arousal/Alertness: Awake/alert Behavior During Therapy: Impulsive;WFL for tasks assessed/performed Overall Cognitive Status: Difficult to assess                                 General Comments: Pt non verbal but was nodding/shaking head more today than yesterday. Pt following 1 step commands.      Exercises      General Comments General comments (skin integrity, edema, etc.): Pt was on 3L O2 when OT entered the room, she was able to complete entire OT session on RA and when checked, SpO2 90 or above. When seated in chair, Pt at 90% and so placed back on 2L; BP once up in chair was: 92/42 (62), and 105/57 (71)      Pertinent Vitals/Pain Pain Assessment: Faces Faces Pain Scale: Hurts little more Pain Location: peri area with cleaning Pain Descriptors / Indicators: Grimacing Pain Intervention(s): Monitored during session;Other (comment) (barrier cream applied)    Home Living Family/patient expects to be discharged to:: Private residence Living Arrangements: Non-relatives/Friends Available Help  at Discharge: Friend(s);Available 24 hours/day Type of Home: Mobile home Home Access: Stairs to enter Entrance Stairs-Rails: Right;Left;Can reach both Home Layout: One level Home Equipment: Cane - single point      Prior Function Level of Independence: Independent with assistive device(s)      Comments: Hydrographic surveyor using SPC, drives   PT Goals (current goals can now be found in the care plan section) Acute Rehab PT Goals Patient Stated Goal: Pt unable to state due to aphasia Progress  towards PT goals: Not progressing toward goals - comment (due to diarrhea)    Frequency    Min 4X/week      PT Plan Current plan remains appropriate    Co-evaluation              AM-PAC PT "6 Clicks" Mobility   Outcome Measure  Help needed turning from your back to your side while in a flat bed without using bedrails?: A Little Help needed moving from lying on your back to sitting on the side of a flat bed without using bedrails?: A Little Help needed moving to and from a bed to a chair (including a wheelchair)?: A Little Help needed standing up from a chair using your arms (e.g., wheelchair or bedside chair)?: A Little Help needed to walk in hospital room?: A Little Help needed climbing 3-5 steps with a railing? : A Lot 6 Click Score: 17    End of Session   Activity Tolerance: Other (comment) (Limited due to diarrhea) Patient left: in bed;with call bell/phone within reach;with bed alarm set;with nursing/sitter in room   PT Visit Diagnosis: Other abnormalities of gait and mobility (R26.89);Hemiplegia and hemiparesis Hemiplegia - Right/Left: Right Hemiplegia - dominant/non-dominant: Dominant Hemiplegia - caused by: Cerebral infarction     Time: 1223-1255 PT Time Calculation (min) (ACUTE ONLY): 32 min  Charges:  $Therapeutic Activity: 23-37 mins                     [place] Pager [phone] Office [place] 12/17/2020, 3:08 PM

## 2020-12-18 DIAGNOSIS — I5021 Acute systolic (congestive) heart failure: Secondary | ICD-10-CM | POA: Diagnosis not present

## 2020-12-18 DIAGNOSIS — J9601 Acute respiratory failure with hypoxia: Secondary | ICD-10-CM | POA: Diagnosis not present

## 2020-12-18 DIAGNOSIS — J690 Pneumonitis due to inhalation of food and vomit: Secondary | ICD-10-CM | POA: Diagnosis not present

## 2020-12-18 LAB — CBC
HCT: 34.1 % — ABNORMAL LOW (ref 36.0–46.0)
Hemoglobin: 10.9 g/dL — ABNORMAL LOW (ref 12.0–15.0)
MCH: 30.1 pg (ref 26.0–34.0)
MCHC: 32 g/dL (ref 30.0–36.0)
MCV: 94.2 fL (ref 80.0–100.0)
Platelets: 219 10*3/uL (ref 150–400)
RBC: 3.62 MIL/uL — ABNORMAL LOW (ref 3.87–5.11)
RDW: 13.5 % (ref 11.5–15.5)
WBC: 9.4 10*3/uL (ref 4.0–10.5)
nRBC: 0 % (ref 0.0–0.2)

## 2020-12-18 LAB — GASTROINTESTINAL PANEL BY PCR, STOOL (REPLACES STOOL CULTURE)

## 2020-12-18 LAB — BASIC METABOLIC PANEL
Anion gap: 11 (ref 5–15)
BUN: 12 mg/dL (ref 8–23)
CO2: 26 mmol/L (ref 22–32)
Calcium: 9 mg/dL (ref 8.9–10.3)
Chloride: 104 mmol/L (ref 98–111)
Creatinine, Ser: 0.97 mg/dL (ref 0.44–1.00)
GFR, Estimated: >60 mL/min (ref >60)
Glucose, Bld: 88 mg/dL (ref 70–99)
Potassium: 3.2 mmol/L — ABNORMAL LOW (ref 3.5–5.1)
Sodium: 141 mmol/L (ref 135–145)

## 2020-12-18 LAB — HEPARIN LEVEL (UNFRACTIONATED): Heparin Unfractionated: 0.33 IU/mL (ref 0.30–0.70)

## 2020-12-18 LAB — PROTIME-INR
INR: 1 (ref 0.8–1.2)
Prothrombin Time: 13.1 seconds (ref 11.4–15.2)

## 2020-12-18 LAB — MAGNESIUM: Magnesium: 1.7 mg/dL (ref 1.7–2.4)

## 2020-12-18 MED ORDER — WARFARIN SODIUM 2 MG PO TABS
3.0000 mg | ORAL_TABLET | Freq: Once | ORAL | Status: AC
  Administered 2020-12-18: 17:00:00 3 mg via ORAL
  Filled 2020-12-18: qty 1

## 2020-12-18 MED ORDER — POTASSIUM CHLORIDE 20 MEQ PO PACK
40.0000 meq | PACK | Freq: Once | ORAL | Status: AC
  Administered 2020-12-18: 11:00:00 40 meq via ORAL
  Filled 2020-12-18: qty 2

## 2020-12-18 MED ORDER — MAGNESIUM SULFATE 2 GM/50ML IV SOLN
2.0000 g | Freq: Once | INTRAVENOUS | Status: AC
  Administered 2020-12-18: 12:00:00 2 g via INTRAVENOUS
  Filled 2020-12-18: qty 50

## 2020-12-18 MED ORDER — ROSUVASTATIN CALCIUM 20 MG PO TABS
40.0000 mg | ORAL_TABLET | Freq: Every day | ORAL | Status: DC
  Administered 2020-12-18 – 2020-12-24 (×7): 40 mg via ORAL
  Filled 2020-12-18 (×7): qty 2

## 2020-12-18 NOTE — Progress Notes (Signed)
Inpatient Rehab Admissions:  Inpatient Rehab Consult received.  I met with patient at the bedside for rehabilitation assessment and to discuss goals and expectations of an inpatient rehab admission.  She is extremely aphasic, appears to be expressive > receptive, but hard to tell.  I briefly explained CIR expectations of 3 hrs/day of therapy, and d/c home with support from family/friends after a short LOS.  When I asked if I could call one of the people listed in her chart to discuss CIR, she clearly and adamantly indicated, "no," on her communication board.  At this time, without being able to confirm 24/7 supervision at discharge, we will not be able to pursue CIR.  Will sign off for now.  If pt does have 24/7 support please feel free to re-consult.   Signed: Shann Medal, PT, DPT Admissions Coordinator [phone] 12/18/20  3:16 PM

## 2020-12-18 NOTE — Progress Notes (Signed)
Nutrition Follow-up  DOCUMENTATION CODES:   Underweight,Severe malnutrition in context of social or environmental circumstances  INTERVENTION:    Magic cup TID with meals, each supplement provides 290 kcal and 9 grams of protein.  Ensure Enlive po BID, each supplement provides 350 kcal and 20 grams of protein. Supplement will have to be thickened to appropriate consistency.  NUTRITION DIAGNOSIS:   Severe Malnutrition related to social / environmental circumstances as evidenced by moderate fat depletion,severe fat depletion,moderate muscle depletion,severe muscle depletion,energy intake < 75% for > or equal to 3 months.  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 Patient admitted for acute respiratory failure with hypoxia secondary to aspiration pneumonia.  12/17 Patient developed sudden onset of aphasia, AMS, and R sided weakness. Found to have acute L MCA infarct.  Patient being followed by SLP and diet was downgraded to dysphagia 1 with honey thick liquids on 12/17. S/P MBS 12/20, recommendation to continue dysphagia 1-honey diet. She has been consuming 0-50% of meals since diet was changed to dysphagia 1-honey. Average meal intake 24% for the past 5 days. Patient has Ensure Enlive/Plus ordered BID, but it appears she has been refusing it up until yesterday.   Patient has been evaluated by CIR, unsure of disposition at this time, CIR vs SNF.   Labs reviewed. K  3.2  Medications reviewed and include Lasix, KCl.  Weight has been trending down since admission. Admission weight 47.2 kg Currently 44.8 kg 5% weight loss since admission (11 days)   Diet Order:   Diet Order            DIET - DYS 1 Room service appropriate? Yes; Fluid consistency: Honey Thick  Diet effective now                 EDUCATION NEEDS:   Education needs have been addressed  Skin:  Skin Assessment: Reviewed RN Assessment  Last BM:  12/21  Height:   Ht Readings from Last 1 Encounters:  12/07/20 5\' 3"  (1.6 m)    Weight:   Wt Readings from Last 1 Encounters:  12/17/20 44.8 kg    BMI:  Body mass index is 17.5 kg/m.  Estimated Nutritional Needs:   Kcal:  1560-1800  Protein:  71-85  Fluid:  >1.2 L    Lucas Mallow, RD, LDN, CNSC Please refer to Amion for contact information.

## 2020-12-18 NOTE — Progress Notes (Signed)
PROGRESS NOTE    Mindy Mills  TKP:[id number] DOB: 1951/06/03 DOA: 12/07/2020 PCP: Patient, No Pcp Per   Brief Narrative:  69 y.o.femalewith medical history significant ofmigraines, previous intracerebral hemorrhage in 2016 which resulted in prolonged course including tracheostomy and PEG tube. Patient has since had PEG tube and tracheostomy removed. She reports that for the past 3 days, she has progressive shortness of breath and cough. She denies any fever. She denies any vomiting. She has not felt as though she is been choking after eating or drinking. She has not had any chest pain. She feels increasingly weak. No dysuria, diarrhea. Since she had progressive shortness of breath, she called EMS for further evaluation  ED Course:Upon arrival to the emergency room, patient is noted to have a mildly elevated creatinine. Oxygen has been applied since she was noted to be hypoxic. Oxygen saturations were 88% on room air. D-dimer was noted to be elevated and CT of the chest was performed that indicated evidence of aspiration pneumonia. COVID-19 test was found to be negative. Troponin mildly elevated around 300 and EKG showed T wave inversions in the anterolateral leads. She was referred for admission.  AM 12/13/20--patient was noted to be dysphasic, not responding to questions nor following commands by RN, and had right hemiparesis. Code Stroke activated at 1100.Work up revealed new acute L-MCA territory stroke, likely cardioembolic. Teleneuro was activated and neurology was consulted. Heparin drip was continued per stroke protocol dosing. This is being transitioned to coumadin. Pharmacy is managing this.  The patient was transferred to [facility] for continued stroke care on 12/14/2020. Neurology was consulted. EEG was performed and was negative for evidence of seizures.  Assessment & Plan:  Acute Cardioembolic Stroke:  -In the setting of LV thrombus.  -CT brain was  suspicious for an acute left MCA infarct involving the insula and possibly the operculum. On 12/17 MR brain demonstrated an acute moderate size left MCA territory infarct. Discontiguous small acute infarct of the left caudate. Third small acute infarct of the right cerebellum.12/17 CTA H&N--Left M2 superior division occlusion;Associated left MCA territory infarct without evidence of a significant penumbra on CTP.Patent carotid and vertebral arteries.  -Patient started on IV heparin which is transitioned to Coumadin as per pharmacy -Neurology signed off -PT/OT has recommended CIR however patient refused to communicate with her family therefore at this time without being able to confirm 24/7 supervision at discharge CIR recommended SNF.  CIR signed off.  Acute respiratory failure with hypoxia: Secondary to pneumoniaand CHFin setting of COPD. The patient is currently saturating 94% on 3L by nasal cannula. Wean off oxygen as tolerated.  Aspiration pneumonia: 12/1 CTA chest--no PE; extensive fluid and debris RLL bronchus.  -Continue Unasyn-last dose would be tomorrow -Evaluated by speech therapy recommended dysphagia 1 diet.  Acute combined systolic and diastolic CHF: As of echocardiogram performed on 12/09/2020 the patient's EF is 25-30% with Grade 2 diastolic dysfunction and an apical thrombus. There is also trivial MR. 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  There are plans for LHC in the next week if the patient is deemed stable.  COPD: The patient is continuing on duoneb, pulmicort, and brovana. She has incentive spirometry. She has a 50 pack her smoking history  Elevated troponin: The patient is without chest pain. She does have T wave inversions in the precordial leads. Echocardiogram shows EF of 25-30%. This is possibly due to demand ischemia in the setting of pneumonia or stress induced CM as  she has a history of Takotsubo's CM. She is on an aspirin and there are tentative  plans for [facility] as outpatient when the patient has completely recovered from this stroke.   Apical thrombus: Source of cardioembonic CVA. Heparin drip was continued per stroke protocol dosing. This is being transitioned to coumadin. Pharmacy is managing this.  History of SAH: Noted. 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 She has been seen by neurology and felt reasonable to start on dual antiplatelet therapy versus anticoagulation as needed.  CKDIIIa: Creatinine at baseline. Monitor creatinine, electrolytes, and volume status. Avoid nephrotoxins and hypotension.   Generalized weakness: The patient has been evaluated by physical therapy with recommendations for SNF. The patient is refusing SNF.  Hypokalemia: Resolved.  Severe Protein Calorie Malnutrition: Nutrition consulted. Continued supplements.    DVT prophylaxis: Coumadin Code Status: DNR Family Communication:  None present at bedside.  Plan of care discussed with patient in length and he verbalized understanding and agreed with it. Disposition Plan: SNF  Consultants:   Neurology  Procedures:   CT head  CTA head and neck  MRI brain  CT cerebral perfusion study  Antimicrobials:   Unasyn  Status is: Inpatient  Remains inpatient appropriate because:IV treatments appropriate due to intensity of illness or inability to take PO   Dispo: The patient is from: Home              Anticipated d/c is to: SNF              Anticipated d/c date is: 2 days              Patient currently is not medically stable to d/c.         Subjective: Patient seen and examined.  Says no to most of my question.  No active events overnight.  Remained afebrile.  She has no active complaints.  Objective: Vitals:   12/18/20 0404 12/18/20 0834 12/18/20 1015 12/18/20 1204  BP: (!) 101/52  103/61   Pulse: 60  63 70  Resp: 19 20  20   Temp: 98.7 F (37.1 C) (!) 97.5 F (36.4 C)  98.4 F (36.9 C)   TempSrc: Oral Oral  Oral  SpO2:    96%  Weight:      Height:       No intake or output data in the 24 hours ending 12/18/20 1644 Filed Weights   12/15/20 0444 12/16/20 0455 12/17/20 0437  Weight: 45 kg 45.5 kg 44.8 kg    Examination:  General exam: Appears calm and comfortable, thin and lean, on 2 L of oxygen via nasal cannula, alert and following commands. Respiratory system: Clear to auscultation. Respiratory effort normal. Cardiovascular system: S1 & S2 heard, RRR. No JVD, murmurs, rubs, gallops or clicks. No pedal edema. Gastrointestinal system: Abdomen is nondistended, soft and nontender. No organomegaly or masses felt. Normal bowel sounds heard. Central nervous system: Alert and following commands.   Skin: No rashes, lesions or ulcers Psychiatry: Judgement and insight appear normal. Mood & affect appropriate.    Data Reviewed: I have personally reviewed following labs and imaging studies  CBC: Recent Labs  Lab 12/14/20 0426 12/15/20 0028 12/16/20 0334 12/17/20 0227 12/18/20 0224  WBC 9.5 10.1 10.8* 9.4 9.4  HGB 11.8* 11.5* 11.5* 10.9* 10.9*  HCT 37.7 34.5* 36.0 33.0* 34.1*  MCV 99.2 93.0 94.2 93.0 94.2  PLT 236 231 244 250 [id number]   Basic Metabolic Panel: Recent  Labs  Lab 12/13/20 [id number] 12/13/20 1115 12/14/20 0426 12/15/20 0028 12/16/20 0334 12/17/20 0227 12/18/20 0224  NA 139   < > 142 141 141 140 141  K 3.7   < > 3.7 3.5 3.4* 2.7* 3.2*  CL 104   < > 107 103 102 100 104  CO2 28   < > 27 25 27 27 26   GLUCOSE 100*   < > 82 97 107* 104* 88  BUN 17   < > 12 11 13 12 12   CREATININE 1.25*   < > 1.00  1.02* 1.18* 1.02* 0.95 0.97  CALCIUM 8.3*   < > 8.5* 8.5* 8.6* 8.6* 9.0  MG 2.0  --  2.1 1.7  --   --  1.7   < > = values in this interval not displayed.   GFR: Estimated Creatinine Clearance: 39.3 mL/min (by C-G formula based on SCr of 0.97 mg/dL). Liver Function Tests: Recent Labs  Lab 12/13/20 1115 12/14/20 0426  AST 25 21  ALT 23 21  ALKPHOS 47 45   BILITOT 0.6 0.6  PROT 6.0* 5.8*  ALBUMIN 2.6* 2.6*   No results for input(s): LIPASE, AMYLASE in the last 168 hours. Recent Labs  Lab 12/13/20 1429  AMMONIA 19   Coagulation Profile: Recent Labs  Lab 12/13/20 1115 12/17/20 0227 12/18/20 0224  INR 1.0 1.1 1.0   Cardiac Enzymes: No results for input(s): CKTOTAL, CKMB, CKMBINDEX, TROPONINI in the last 168 hours. BNP (last 3 results) No results for input(s): PROBNP in the last 8760 hours. HbA1C: Recent Labs    12/16/20 0334  HGBA1C 5.2   CBG: Recent Labs  Lab 12/13/20 1104  GLUCAP 94   Lipid Profile: Recent Labs    12/16/20 0334  CHOL 199  HDL 44  LDLCALC 137*  TRIG 91  CHOLHDL 4.5   Thyroid Function Tests: No results for input(s): TSH, T4TOTAL, FREET4, T3FREE, THYROIDAB in the last 72 hours. Anemia Panel: No results for input(s): VITAMINB12, FOLATE, FERRITIN, TIBC, IRON, RETICCTPCT in the last 72 hours. Sepsis Labs: Recent Labs  Lab 12/12/20 [place] <0.10    Recent Results (from the past 240 hour(s))  Culture, Urine     Status: None   Collection Time: 12/13/20  6:25 PM   Specimen: Urine, Catheterized  Result Value Ref Range Status   Specimen Description   Final    URINE, CATHETERIZED Performed at [facility], [address], [place], [place] [address]    Special Requests   Final    NONE Performed at [facility], [address], [place], [place] [address]    Culture   Final    NO GROWTH Performed at [facility], [place] [address], [place], [place] [address]    Report Status 12/15/2020 FINAL  Final      Radiology Studies: No results found.  Scheduled Meds: . arformoterol  15 mcg Nebulization BID  . budesonide (PULMICORT) nebulizer solution  0.5 mg Nebulization BID  . coumadin book   Does not apply Once  . feeding supplement  237 mL Oral BID BM  . FLUoxetine  80 mg Oral Daily  . furosemide  40 mg Oral Daily  . guaiFENesin  600 mg Oral BID  . metoprolol succinate  12.5 mg  Oral Daily  . rosuvastatin  40 mg Oral Daily  . Warfarin - Pharmacist Dosing Inpatient   Does not apply q1600   Continuous Infusions: . ampicillin-sulbactam (UNASYN) IV 3 g (12/18/20 1610)  .  heparin 950 Units/hr (12/17/20 0203)     LOS: 11 days   Time spent: 35 minutes   Marissa Lowrey Loann Quill, MD [facility]  If 7PM-7AM, please contact night-coverage [email or web address] 12/18/2020, 4:44 PM

## 2020-12-18 NOTE — Progress Notes (Signed)
PT Cancellation Note  Patient Details Name: Mindy Mills MRN: [id number] DOB: [birth year]   Cancelled Treatment:    Reason Eval/Treat Not Completed: (P) Patient declined, no reason specified (Pt refused holding to covers and resisting movements.  Deferred further attempt as patient communicating she did not want to participate.)   Malina Geers Eli Hose 12/18/2020, 5:50 PM Erasmo Leventhal , PTA Acute Rehabilitation Services Pager [phone] Office [phone]

## 2020-12-18 NOTE — Progress Notes (Signed)
ANTICOAGULATION CONSULT NOTE - Follow Up Consult  Pharmacy Consult for Heparin>warfarin Indication: cardiac thrombus and stroke  Allergies  Allergen Reactions  . Bee Venom Anaphylaxis and Hives  . Capsicum (Cayenne) [Cayenne] Swelling    Patient Measurements: Height: 5\' 3"  (160 cm) Weight: 44.8 kg (98 lb 12.3 oz) IBW/kg (Calculated) : 52.4 Heparin Dosing Weight: 47.7 kg  Vital Signs: Temp: 98.7 F (37.1 C) (12/22 0404) Temp Source: Oral (12/22 0404) BP: 101/52 (12/22 0404) Pulse Rate: 60 (12/22 0404)  Labs: Recent Labs    12/16/20 0334 12/17/20 0227 12/18/20 0224  HGB 11.5* 10.9* 10.9*  HCT 36.0 33.0* 34.1*  PLT 244 250 219  LABPROT  --  13.8 13.1  INR  --  1.1 1.0  HEPARINUNFRC 0.53 0.39 0.33  CREATININE 1.02* 0.95 0.97    Estimated Creatinine Clearance: 39.3 mL/min (by C-G formula based on SCr of 0.97 mg/dL).  Assessment: 69 yr old female started on IV heparin on 12/10/20 for apical thrombus. Prior hx SAH, cleared for anticoagulation by Neuro. No plans for cath this admit. New CVA on 12/18, now using low therapeutic goal for heparin dosing.  Heparin level is at goal at 0.33, on 950 units/hr. CBC stable. No s/sx of bleeding or infusion issues. Warfarin started 12/20. INR remains subtherapeutic at 1 - anticipate start to see warfarin dosing.    Goal of Therapy:  INR goal 2-3 Heparin level 0.3-0.5 units/ml Monitor platelets by anticoagulation protocol: Yes   Plan:  Continue heparin infusion at 950 units/hr  Daily heparin level, CBC Warfarin 3 mg tonight Daily INR  Antonietta Jewel, PharmD, [place] Clinical Pharmacist  Phone: [phone] 12/18/2020 7:25 AM  Please check AMION for all [place] phone numbers After 10:00 PM, call [place] [phone]

## 2020-12-19 DIAGNOSIS — J9601 Acute respiratory failure with hypoxia: Secondary | ICD-10-CM | POA: Diagnosis not present

## 2020-12-19 DIAGNOSIS — I5021 Acute systolic (congestive) heart failure: Secondary | ICD-10-CM | POA: Diagnosis not present

## 2020-12-19 DIAGNOSIS — J690 Pneumonitis due to inhalation of food and vomit: Secondary | ICD-10-CM | POA: Diagnosis not present

## 2020-12-19 LAB — CBC WITH DIFFERENTIAL/PLATELET
Abs Immature Granulocytes: 0.02 10*3/uL (ref 0.00–0.07)
Basophils Absolute: 0 10*3/uL (ref 0.0–0.1)
Basophils Relative: 1 %
Eosinophils Absolute: 0.1 10*3/uL (ref 0.0–0.5)
Eosinophils Relative: 1 %
HCT: 35.9 % — ABNORMAL LOW (ref 36.0–46.0)
Hemoglobin: 11.5 g/dL — ABNORMAL LOW (ref 12.0–15.0)
Immature Granulocytes: 0 %
Lymphocytes Relative: 28 %
Lymphs Abs: 2.4 10*3/uL (ref 0.7–4.0)
MCH: 30 pg (ref 26.0–34.0)
MCHC: 32 g/dL (ref 30.0–36.0)
MCV: 93.7 fL (ref 80.0–100.0)
Monocytes Absolute: 0.6 10*3/uL (ref 0.1–1.0)
Monocytes Relative: 7 %
Neutro Abs: 5.4 10*3/uL (ref 1.7–7.7)
Neutrophils Relative %: 63 %
Platelets: 277 10*3/uL (ref 150–400)
RBC: 3.83 MIL/uL — ABNORMAL LOW (ref 3.87–5.11)
RDW: 13.9 % (ref 11.5–15.5)
WBC: 8.5 10*3/uL (ref 4.0–10.5)
nRBC: 0 % (ref 0.0–0.2)

## 2020-12-19 LAB — BASIC METABOLIC PANEL
Anion gap: 9 (ref 5–15)
BUN: 11 mg/dL (ref 8–23)
CO2: 28 mmol/L (ref 22–32)
Calcium: 8.6 mg/dL — ABNORMAL LOW (ref 8.9–10.3)
Chloride: 103 mmol/L (ref 98–111)
Creatinine, Ser: 1.03 mg/dL — ABNORMAL HIGH (ref 0.44–1.00)
GFR, Estimated: 59 mL/min — ABNORMAL LOW (ref >60)
Glucose, Bld: 103 mg/dL — ABNORMAL HIGH (ref 70–99)
Potassium: 3.4 mmol/L — ABNORMAL LOW (ref 3.5–5.1)
Sodium: 140 mmol/L (ref 135–145)

## 2020-12-19 LAB — PROTIME-INR
INR: 1.1 (ref 0.8–1.2)
Prothrombin Time: 13.7 seconds (ref 11.4–15.2)

## 2020-12-19 LAB — HEPARIN LEVEL (UNFRACTIONATED): Heparin Unfractionated: 0.45 IU/mL (ref 0.30–0.70)

## 2020-12-19 MED ORDER — WARFARIN SODIUM 2 MG PO TABS
3.0000 mg | ORAL_TABLET | Freq: Once | ORAL | Status: AC
  Administered 2020-12-19: 17:00:00 3 mg via ORAL
  Filled 2020-12-19: qty 1

## 2020-12-19 MED ORDER — POTASSIUM CHLORIDE 20 MEQ PO PACK
40.0000 meq | PACK | Freq: Once | ORAL | Status: AC
  Administered 2020-12-19: 10:00:00 40 meq via ORAL
  Filled 2020-12-19: qty 2

## 2020-12-19 NOTE — Progress Notes (Signed)
ANTICOAGULATION CONSULT NOTE - Follow Up Consult  Pharmacy Consult for Heparin>warfarin Indication: cardiac thrombus and stroke  Allergies  Allergen Reactions  . Bee Venom Anaphylaxis and Hives  . Capsicum (Cayenne) [Cayenne] Swelling    Patient Measurements: Height: 5\' 3"  (160 cm) Weight: 45 kg (99 lb 3.3 oz) IBW/kg (Calculated) : 52.4 Heparin Dosing Weight: 47.7 kg  Vital Signs: Temp: 97.7 F (36.5 C) (12/23 0538) Temp Source: Oral (12/23 0538) BP: 102/62 (12/23 0939) Pulse Rate: 66 (12/23 0939)  Labs: Recent Labs    12/17/20 0227 12/18/20 0224 12/19/20 0315  HGB 10.9* 10.9* 11.5*  HCT 33.0* 34.1* 35.9*  PLT 250 219 277  LABPROT 13.8 13.1 13.7  INR 1.1 1.0 1.1  HEPARINUNFRC 0.39 0.33 0.45  CREATININE 0.95 0.97 1.03*    Estimated Creatinine Clearance: 37.1 mL/min (A) (by C-G formula based on SCr of 1.03 mg/dL (H)).  Assessment: 69 yr old female started on IV heparin on 12/10/20 for apical thrombus. Prior hx SAH, cleared for anticoagulation by Neuro. No plans for cath this admit. New CVA on 12/18, now using low therapeutic goal for heparin dosing.  Heparin level is at goal at 0.45, on 950 units/hr. CBC stable. INR= 1.1 (warfarin dose increased 12/22)  Goal of Therapy:  INR goal 2-3 Heparin level 0.3-0.5 units/ml Monitor platelets by anticoagulation protocol: Yes   Plan:  Continue heparin infusion at 950 units/hr  Daily heparin level, CBC Warfarin 3 mg tonight Daily INR  Hildred Laser, PharmD Clinical Pharmacist **Pharmacist phone directory can now be found on [email or web address] ([email or web address]).  Listed under [place].

## 2020-12-19 NOTE — TOC Progression Note (Addendum)
Transition of Care [facility]) - Progression Note    Patient Details  Name: Mindy Mills MRN: [id number] Date of Birth: 1951-02-09  Transition of Care [facility]) CM/SW [place], [place] Phone Number: 12/19/2020, 2:23 PM  Clinical Narrative:      CSW was unable to complete assessment for possible SNF placement. Unable to get any clear answers from patient. From Caitlins note with CIR she got a clear no from communication board that patient does not want anyone contacted on her facesheet.  Hopeful that [facility] team will be more successful tomorrow with completing assessment with patient.  CSW will continue to follow.   Expected Discharge Plan: [place] Barriers to Discharge: Continued Medical Work up  Expected Discharge Plan and Services Expected Discharge Plan: [place] In-house Referral: Clinical Social Work     Living arrangements for the past 2 months: Single Family Home                                       Social Determinants of Health (SDOH) Interventions    Readmission Risk Interventions No flowsheet data found.

## 2020-12-19 NOTE — Progress Notes (Signed)
Physical Therapy Treatment Patient Details Name: Mindy Mills MRN: [id number] DOB: 30-Mar-1951 Today's Date: 12/19/2020    History of Present Illness Pt adm to [facility] on 12/07/20 with acute systolic heart failure, aspiration PNA, apical thrombus, and acute respiratory failure with hypoxia. On 12/17 developed sudden onset of aphasia, altered mental status, and rt sided weakness. Code stroke called and pt found to have acute left MCA infarct. Transferred to [facility] on 12/18. PMH - SAH s/p coiling 2015, copd, chf, htn, migraine.    PT Comments    Pt making excellent progress towards her physical therapy goals this date; seems motivated to participate. RN/NT present at beginning of session to clean pt up following bowel incontinence. Pt requiring min guard assist (+2 for safety/equipment) for functional mobility. Ambulating 160 feet with a walker, SpO2 > 91% on 2L O2. BP 99/53 (67) post mobility; pt denying dizziness/lightheadedness. Pt continues with impaired communication, right sided weakness, balance deficits, gait abnormalities. Will benefit from post acute rehab to address deficits and maximize functional independence.     Follow Up Recommendations  SNF     Equipment Recommendations  Rolling walker with 5" wheels    Recommendations for Other Services       Precautions / Restrictions Precautions Precautions: Fall Restrictions Weight Bearing Restrictions: No    Mobility  Bed Mobility Overal bed mobility: Needs Assistance Bed Mobility: Rolling;Sidelying to Sit Rolling: Min assist Sidelying to sit: Min guard       General bed mobility comments: pt required MIN A to roll to pts R side, however once pt in slidelying pt able to progress to sitting with min guard for safety  Transfers Overall transfer level: Needs assistance Equipment used: Rolling walker (2 wheeled) Transfers: Sit to/from Stand Sit to Stand: Min guard         General transfer comment: MIN G +1 to power into  standing from EOB for safety and inital steadying assist. once pt in standing, pt required assist to grip RW with R hand  Ambulation/Gait Ambulation/Gait assistance: Min guard;+2 safety/equipment Gait Distance (Feet): 160 Feet Assistive device: Rolling walker (2 wheeled) Gait Pattern/deviations: Step-through pattern;Decreased step length - right;Decreased dorsiflexion - right;Narrow base of support Gait velocity: decreased   General Gait Details: Min guard for safety, difficulty with dual tasking, having to stop when performing head turns and locating objects in environment   Stairs             Wheelchair Mobility    Modified Rankin (Stroke Patients Only) Modified Rankin (Stroke Patients Only) Pre-Morbid Rankin Score: No symptoms Modified Rankin: Moderately severe disability     Balance Overall balance assessment: Needs assistance Sitting-balance support: No upper extremity supported;Feet supported Sitting balance-Leahy Scale: Good Sitting balance - Comments: seated at EOB   Standing balance support: Bilateral upper extremity supported;During functional activity Standing balance-Leahy Scale: Poor Standing balance comment: UE supported required for mobility tasks                            Cognition Arousal/Alertness: Awake/alert Behavior During Therapy: WFL for tasks assessed/performed Overall Cognitive Status: Difficult to assess                                 General Comments: pt continues to be aphasic but nodding appropriately throughout session and responding to yes or no questions, pt with bouts of unintelligible speech attempting to communicate  with therapists      Exercises      General Comments General comments (skin integrity, edema, etc.): pt on 2L throughout ambulation with O2 >/= 91%; BP 99/53 post ambulation from recliner with legs down      Pertinent Vitals/Pain Pain Assessment: Faces Faces Pain Scale: Hurts little  more Pain Location: general discomfort during posterior pericare Pain Descriptors / Indicators: Grimacing Pain Intervention(s): Monitored during session    Home Living                      Prior Function            PT Goals (current goals can now be found in the care plan section) Acute Rehab PT Goals Patient Stated Goal: Pt unable to state due to aphasia Potential to Achieve Goals: Good Progress towards PT goals: Progressing toward goals    Frequency    Min 3X/week      PT Plan Discharge plan needs to be updated;Frequency needs to be updated    Co-evaluation PT/OT/SLP Co-Evaluation/Treatment: Yes Reason for Co-Treatment: For patient/therapist safety;To address functional/ADL transfers;Other (comment) (frequent bowel incontinence) PT goals addressed during session: Mobility/safety with mobility;Balance;Proper use of DME OT goals addressed during session: ADL's and self-care      AM-PAC PT "6 Clicks" Mobility   Outcome Measure  Help needed turning from your back to your side while in a flat bed without using bedrails?: A Little Help needed moving from lying on your back to sitting on the side of a flat bed without using bedrails?: A Little Help needed moving to and from a bed to a chair (including a wheelchair)?: A Little Help needed standing up from a chair using your arms (e.g., wheelchair or bedside chair)?: A Little Help needed to walk in hospital room?: A Little Help needed climbing 3-5 steps with a railing? : A Lot 6 Click Score: 17    End of Session Equipment Utilized During Treatment: Gait belt;Oxygen Activity Tolerance: Patient tolerated treatment well Patient left: in chair;with call bell/phone within reach;with chair alarm set Nurse Communication: Mobility status PT Visit Diagnosis: Other abnormalities of gait and mobility (R26.89);Hemiplegia and hemiparesis Hemiplegia - Right/Left: Right Hemiplegia - dominant/non-dominant: Dominant Hemiplegia  - caused by: Cerebral infarction     Time: [id number] PT Time Calculation (min) (ACUTE ONLY): 36 min  Charges:  $Therapeutic Activity: 8-22 mins                     Wyona Almas, PT, DPT Acute Rehabilitation Services Pager [phone] Office [phone]    Deno Etienne 12/19/2020, 9:48 AM

## 2020-12-19 NOTE — Progress Notes (Signed)
Occupational Therapy Treatment Patient Details Name: Mindy Mills MRN: [id number] DOB: December 10, 1951 Today's Date: 12/19/2020    History of present illness Pt adm to [facility] on 12/07/20 with acute systolic heart failure, aspiration PNA, apical thrombus, and acute respiratory failure with hypoxia. On 12/17 developed sudden onset of aphasia, altered mental status, and rt sided weakness. Code stroke called and pt found to have acute left MCA infarct. Transferred to [facility] on 12/18. PMH - SAH s/p coiling 2015, copd, chf, htn, migraine.   OT comments  Pt seen in conjunction with PT to maximize pts activity tolerance, with pt making excellent progress towards OT goals this session. Pt currently requires MAX A for UB ADLs and total A for LB ADLs as pt continues to be limited by frequent stools, however pt able to ambulate greater than a household distance this session with RW and min guard +2 for safety. Pt on 2L during session with O2 >/= 91% during session.  Pt with improved R sided inattention in comparison to previous OT session however continues to require cues to locate objects on side of environment. Pt required physical assist to grip RW with R hand initially, but able to sustain functional grasp during mobility. Noted CIR to sign off d/t decreased ability to confirm 24/7 supervision at discharge, therefore updated DC recs to SNF. Will let OTR know about change in POC, will continue to follow acutely per POC.     Follow Up Recommendations  CIR;Supervision/Assistance - 24 hour;SNF    Equipment Recommendations  3 in 1 bedside commode;Other (comment) (defer to next venue of care)    Recommendations for Other Services      Precautions / Restrictions Precautions Precautions: Fall Restrictions Weight Bearing Restrictions: No       Mobility Bed Mobility Overal bed mobility: Needs Assistance Bed Mobility: Rolling;Sidelying to Sit Rolling: Min assist (to initiate rolling) Sidelying to sit: Min  guard       General bed mobility comments: pt required MIN A to roll to pts R side, however once pt in slidelying pt able to progress to sitting with min guard for safety  Transfers Overall transfer level: Needs assistance Equipment used: Rolling walker (2 wheeled) Transfers: Sit to/from Stand Sit to Stand: Min guard         General transfer comment: MIN G +1 to power into standing from EOB for safety and inital steadying assist. once pt in standing, pt required assist to grip RW with R hand    Balance Overall balance assessment: Needs assistance Sitting-balance support: No upper extremity supported;Feet supported Sitting balance-Leahy Scale: Fair Sitting balance - Comments: seated at EOB   Standing balance support: Bilateral upper extremity supported;During functional activity Standing balance-Leahy Scale: Poor Standing balance comment: UE supported required for mobility tasks                           ADL either performed or assessed with clinical judgement   ADL Overall ADL's : Needs assistance/impaired             Lower Body Bathing: Total assistance;Bed level Lower Body Bathing Details (indicate cue type and reason): total A for LB bathing from supine Upper Body Dressing : Maximal assistance;Sitting Upper Body Dressing Details (indicate cue type and reason): to don new gown Lower Body Dressing: Maximal assistance;Total assistance;Sit to/from stand Lower Body Dressing Details (indicate cue type and reason): to don briefs and socks via sit <>stand Toilet Transfer: Minimal assistance;+2  for safety/equipment;RW;Ambulation Toilet Transfer Details (indicate cue type and reason): simulated via functional mobility with RW, +2 for safety Toileting- Clothing Manipulation and Hygiene: Total assistance;Bed level Toileting - Clothing Manipulation Details (indicate cue type and reason): incontinent of stool     Functional mobility during ADLs: Minimal assistance;+2  for safety/equipment;Rolling walker General ADL Comments: pt with good prorgression towards OT goals this session able to complete functional mobility greater than a household distance with RW and MIN A +2 for safety, pt continues to be limited by frequent loose stools needing MAX - total A for pericare and LB ADLS.     Vision   Vision Assessment?: Vision impaired- to be further tested in functional context Additional Comments: continue to note some R sided inattention during mobility, as pt requried cues to locate items on R side of environment during mobility, will continue to assess   Perception     Praxis      Cognition Arousal/Alertness: Awake/alert Behavior During Therapy: WFL for tasks assessed/performed Overall Cognitive Status: Difficult to assess                                 General Comments: pt continues to be aphasic but nodding appropriately throughout session and responding to yes or no questions, pt with bouts of iunntelligible speech attempting to communicate with therapists        Exercises     Shoulder Instructions       General Comments pt on 2L throughout ambulation with O2 >/= 91%; BP 99/53 post ambulation from recliner with legs down    Pertinent Vitals/ Pain       Pain Assessment: Faces Faces Pain Scale: Hurts little more Pain Location: general discomfort during posterior pericare Pain Descriptors / Indicators: Grimacing Pain Intervention(s): Limited activity within patient's tolerance;Monitored during session;Repositioned  Home Living                                          Prior Functioning/Environment              Frequency  Min 2X/week        Progress Toward Goals  OT Goals(current goals can now be found in the care plan section)  Progress towards OT goals: Progressing toward goals  Acute Rehab OT Goals Patient Stated Goal: Pt unable to state due to aphasia OT Goal Formulation: Patient unable  to participate in goal setting Time For Goal Achievement: 12/31/20 Potential to Achieve Goals: Good  Plan Discharge plan remains appropriate;Frequency remains appropriate    Co-evaluation    PT/OT/SLP Co-Evaluation/Treatment: Yes Reason for Co-Treatment: For patient/therapist safety;To address functional/ADL transfers   OT goals addressed during session: ADL's and self-care      AM-PAC OT "6 Clicks" Daily Activity     Outcome Measure   Help from another person eating meals?: A Little Help from another person taking care of personal grooming?: A Little Help from another person toileting, which includes using toliet, bedpan, or urinal?: A Little Help from another person bathing (including washing, rinsing, drying)?: A Little Help from another person to put on and taking off regular upper body clothing?: A Little Help from another person to put on and taking off regular lower body clothing?: A Lot 6 Click Score: 17    End of Session Equipment Utilized During Treatment: Gait  belt;Rolling walker;Oxygen;Other (comment) (2L )  OT Visit Diagnosis: Unsteadiness on feet (R26.81);Other abnormalities of gait and mobility (R26.89);Muscle weakness (generalized) (M62.81);Other symptoms and signs involving cognitive function;Other symptoms and signs involving the nervous system (R29.898);Hemiplegia and hemiparesis Hemiplegia - Right/Left: Right Hemiplegia - dominant/non-dominant: Dominant Hemiplegia - caused by: Cerebral infarction   Activity Tolerance Patient tolerated treatment well   Patient Left in chair;with call bell/phone within reach;with chair alarm set   Nurse Communication Mobility status;Other (comment) (needs supervision to eat breakfast)        Time: [id number] OT Time Calculation (min): 35 min  Charges: OT General Charges $OT Visit: 1 Visit OT Treatments $Therapeutic Activity: 8-22 mins  Lanier Clam., COTA/L Acute Rehabilitation  Services [phone]   Ihor Gully 12/19/2020, 9:45 AM

## 2020-12-19 NOTE — Progress Notes (Addendum)
PROGRESS NOTE    Mindy Mills  VOZ:[id number] DOB: 05-18-1951 DOA: 12/07/2020 PCP: Patient, No Pcp Per   Brief Narrative:  69 y.o.femalewith medical history significant ofmigraines, previous intracerebral hemorrhage in 2016 which resulted in prolonged course including tracheostomy and PEG tube. Patient has since had PEG tube and tracheostomy removed. She reports that for the past 3 days, she has progressive shortness of breath and cough. She denies any fever. She denies any vomiting. She has not felt as though she is been choking after eating or drinking. She has not had any chest pain. She feels increasingly weak. No dysuria, diarrhea. Since she had progressive shortness of breath, she called EMS for further evaluation  ED Course:Upon arrival to the emergency room, patient is noted to have a mildly elevated creatinine. Oxygen has been applied since she was noted to be hypoxic. Oxygen saturations were 88% on room air. D-dimer was noted to be elevated and CT of the chest was performed that indicated evidence of aspiration pneumonia. COVID-19 test was found to be negative. Troponin mildly elevated around 300 and EKG showed T wave inversions in the anterolateral leads. She was referred for admission.  AM 12/13/20--patient was noted to be dysphasic, not responding to questions nor following commands by RN, and had right hemiparesis. Code Stroke activated at 1100.Work up revealed new acute L-MCA territory stroke, likely cardioembolic. Teleneuro was activated and neurology was consulted. Heparin drip was continued per stroke protocol dosing. This is being transitioned to coumadin. Pharmacy is managing this.  The patient was transferred to [facility] for continued stroke care on 12/14/2020. Neurology was consulted. EEG was performed and was negative for evidence of seizures.  Assessment & Plan:  Acute Cardioembolic Stroke:  -In the setting of LV thrombus.  -CT brain was suspicious  for an acute left MCA infarct involving the insula and possibly the operculum.  -On 12/17 MR brain demonstrated an acute moderate size left MCA territory infarct. Discontiguous small acute infarct of the left caudate. Third small acute infarct of the right cerebellum. -On 12/17 CTA H&N--Left M2 superior division occlusion;Associated left MCA territory infarct without evidence of a significant penumbra on CTP.Patent carotid and vertebral arteries.  -Patient started on IV heparin which is transitioned to Coumadin as per pharmacy -Neurology signed off -PT/OT has recommended SNF  Acute respiratory failure with hypoxia: Secondary to pneumoniaand [facility] setting of COPD. The patient is currently saturating 94% on 2-3L by nasal cannula. Wean off oxygen as tolerated.  Aspiration pneumonia: 12/1 CTA chest--no PE; extensive fluid and debris RLL bronchus.  -Finished Unasyn for 7 days.  Discontinue Unasyn on 12/23. -Evaluated by speech therapy recommended dysphagia 1 diet.  Acute combined systolic and diastolic CHF: As of echocardiogram performed on 12/09/2020 the patient's EF is 25-30% with Grade 2 diastolic dysfunction and an apical thrombus. There is also trivial MR. 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  There are plans for [facility] as outpatient.  COPD: No wheezing noted on exam.  The patient is continuing on duoneb, pulmicort, and brovana. She has incentive spirometry. She has a 50 pack her smoking history  Elevated troponin: The patient is without chest pain. She does have T wave inversions in the precordial leads. Echocardiogram shows EF of 25-30%. This is possibly due to demand ischemia in the setting of pneumonia or stress induced CM as she has a history of Takotsubo's CM. -plans for [facility] as outpatient when the patient has completely recovered from this stroke.  Apical thrombus: Source of cardioembonic CVA. Continue Coumadin as per pharmacy.  History of SAH: Noted. 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 She has been seen by neurology and felt reasonable to start on dual antiplatelet therapy versus anticoagulation as needed.  CKDIIIa: Creatinine at baseline. Monitor creatinine, electrolytes, and volume status. Avoid nephrotoxins and hypotension.   Generalized weakness: The patient has been evaluated by physical therapy with recommendations for SNF.   Hypokalemia: Replenished.  Repeat BMP tomorrow a.m.  Severe Protein Calorie Malnutrition: Nutrition consulted. Continued supplements.    DVT prophylaxis: Coumadin Code Status: DNR Family Communication:  None present at bedside.  Plan of care discussed with patient in length and he verbalized understanding and agreed with it. Disposition Plan: SNF  Consultants:   Neurology  Procedures:   CT head  CTA head and neck  MRI brain  CT cerebral perfusion study  Antimicrobials:   Unasyn  Status is: Inpatient  Remains inpatient appropriate because:IV treatments appropriate due to intensity of illness or inability to take PO   Dispo: The patient is from: Home              Anticipated d/c is to: SNF              Anticipated d/c date is: 2 days              Patient currently is not medically stable to d/c.    Subjective: Patient seen and examined.  OT at bedside.  She is noncomprehensive she noded her head "no" to most of my questions.  She mentioned to OT that she is hurting all over.  No acute events overnight.  Remained afebrile.  Objective: Vitals:   12/19/20 0538 12/19/20 0900 12/19/20 0939 12/19/20 1150  BP: 106/62  102/62 95/73  Pulse: 61  66 70  Resp:    17  Temp: 97.7 F (36.5 C)   (!) 97.3 F (36.3 C)  TempSrc: Oral   Oral  SpO2: 97% 93%  97%  Weight:      Height:        Intake/Output Summary (Last 24 hours) at 12/19/2020 1305 Last data filed at 12/18/2020 1815 Gross per 24 hour  Intake 515.75 ml  Output --  Net 515.75 ml   Filed Weights    12/16/20 0455 12/17/20 0437 12/19/20 0436  Weight: 45.5 kg 44.8 kg 45 kg    Examination:  General exam: Appears calm and comfortable, thin and lean, on 2 L of oxygen via nasal cannula, alert and following commands. Respiratory system: Clear to auscultation. Respiratory effort normal. Cardiovascular system: S1 & S2 heard, RRR. No JVD, murmurs, rubs, gallops or clicks. No pedal edema. Gastrointestinal system: Abdomen is nondistended, soft and nontender. No organomegaly or masses felt. Normal bowel sounds heard. Central nervous system: Alert and following commands.   Skin: No rashes, lesions or ulcers Psychiatry: Judgement and insight appear normal. Mood & affect appropriate.    Data Reviewed: I have personally reviewed following labs and imaging studies  CBC: Recent Labs  Lab 12/15/20 0028 12/16/20 0334 12/17/20 0227 12/18/20 0224 12/19/20 0315  WBC 10.1 10.8* 9.4 9.4 8.5  NEUTROABS  --   --   --   --  5.4  HGB 11.5* 11.5* 10.9* 10.9* 11.5*  HCT 34.5* 36.0 33.0* 34.1* 35.9*  MCV 93.0 94.2 93.0 94.2 93.7  PLT 231 244 250 219 [id number]   Basic Metabolic Panel: Recent Labs  Lab  12/13/20 [id number] 12/13/20 1115 12/14/20 0426 12/15/20 0028 12/16/20 0334 12/17/20 0227 12/18/20 0224 12/19/20 0315  NA 139   < > 142 141 141 140 141 140  K 3.7   < > 3.7 3.5 3.4* 2.7* 3.2* 3.4*  CL 104   < > 107 103 102 100 104 103  CO2 28   < > 27 25 27 27 26 28   GLUCOSE 100*   < > 82 97 107* 104* 88 103*  BUN 17   < > 12 11 13 12 12 11   CREATININE 1.25*   < > 1.00  1.02* 1.18* 1.02* 0.95 0.97 1.03*  CALCIUM 8.3*   < > 8.5* 8.5* 8.6* 8.6* 9.0 8.6*  MG 2.0  --  2.1 1.7  --   --  1.7  --    < > = values in this interval not displayed.   GFR: Estimated Creatinine Clearance: 37.1 mL/min (A) (by C-G formula based on SCr of 1.03 mg/dL (H)). Liver Function Tests: Recent Labs  Lab 12/13/20 1115 12/14/20 0426  AST 25 21  ALT 23 21  ALKPHOS 47 45  BILITOT 0.6 0.6  PROT 6.0* 5.8*  ALBUMIN 2.6*  2.6*   No results for input(s): LIPASE, AMYLASE in the last 168 hours. Recent Labs  Lab 12/13/20 1429  AMMONIA 19   Coagulation Profile: Recent Labs  Lab 12/13/20 1115 12/17/20 0227 12/18/20 0224 12/19/20 0315  INR 1.0 1.1 1.0 1.1   Cardiac Enzymes: No results for input(s): CKTOTAL, CKMB, CKMBINDEX, TROPONINI in the last 168 hours. BNP (last 3 results) No results for input(s): PROBNP in the last 8760 hours. HbA1C: No results for input(s): HGBA1C in the last 72 hours. CBG: Recent Labs  Lab 12/13/20 1104  GLUCAP 94   Lipid Profile: No results for input(s): CHOL, HDL, LDLCALC, TRIG, CHOLHDL, LDLDIRECT in the last 72 hours. Thyroid Function Tests: No results for input(s): TSH, T4TOTAL, FREET4, T3FREE, THYROIDAB in the last 72 hours. Anemia Panel: No results for input(s): VITAMINB12, FOLATE, FERRITIN, TIBC, IRON, RETICCTPCT in the last 72 hours. Sepsis Labs: No results for input(s): PROCALCITON, LATICACIDVEN in the last 168 hours.  Recent Results (from the past 240 hour(s))  Culture, Urine     Status: None   Collection Time: 12/13/20  6:25 PM   Specimen: Urine, Catheterized  Result Value Ref Range Status   Specimen Description   Final    URINE, CATHETERIZED Performed at [facility], [address] [place], [place] [address]    Special Requests   Final    NONE Performed at [facility], [address] [place], [place] [address]    Culture   Final    NO GROWTH Performed at [facility], [place] [address] [place],  [address]    Report Status 12/15/2020 FINAL  Final  Gastrointestinal Panel by PCR , Stool     Status: None   Collection Time: 12/17/20 12:47 PM   Specimen: Stool  Result Value Ref Range Status   Campylobacter species NOT DETECTED NOT DETECTED Final   Plesimonas shigelloides NOT DETECTED NOT DETECTED Final   Salmonella species NOT DETECTED NOT DETECTED Final   Yersinia enterocolitica NOT DETECTED NOT DETECTED Final   Vibrio species NOT  DETECTED NOT DETECTED Final   Vibrio cholerae NOT DETECTED NOT DETECTED Final   Enteroaggregative E coli (EAEC) NOT DETECTED NOT DETECTED Final   Enteropathogenic E coli (EPEC) NOT DETECTED NOT DETECTED Final   Enterotoxigenic E coli (ETEC) NOT DETECTED NOT  DETECTED Final   Shiga like toxin producing E coli (STEC) NOT DETECTED NOT DETECTED Final   Shigella/Enteroinvasive E coli (EIEC) NOT DETECTED NOT DETECTED Final   Cryptosporidium NOT DETECTED NOT DETECTED Final   Cyclospora cayetanensis NOT DETECTED NOT DETECTED Final   Entamoeba histolytica NOT DETECTED NOT DETECTED Final   Giardia lamblia NOT DETECTED NOT DETECTED Final   Adenovirus F40/41 NOT DETECTED NOT DETECTED Final   Astrovirus NOT DETECTED NOT DETECTED Final   Norovirus GI/GII NOT DETECTED NOT DETECTED Final   Rotavirus A NOT DETECTED NOT DETECTED Final   Sapovirus (I, II, IV, and V) NOT DETECTED NOT DETECTED Final    Comment: Performed at [facility], [address] [place], [place] [address]      Radiology Studies: No results found.  Scheduled Meds: . arformoterol  15 mcg Nebulization BID  . budesonide (PULMICORT) nebulizer solution  0.5 mg Nebulization BID  . coumadin book   Does not apply Once  . feeding supplement  237 mL Oral BID BM  . FLUoxetine  80 mg Oral Daily  . furosemide  40 mg Oral Daily  . guaiFENesin  600 mg Oral BID  . metoprolol succinate  12.5 mg Oral Daily  . rosuvastatin  40 mg Oral Daily  . warfarin  3 mg Oral ONCE-1600  . Warfarin - Pharmacist Dosing Inpatient   Does not apply q1600   Continuous Infusions: . ampicillin-sulbactam (UNASYN) IV 3 g (12/19/20 0952)  . heparin 950 Units/hr (12/17/20 0203)     LOS: 12 days   Time spent: 35 minutes   Mindy Mills Loann Quill, MD [facility]  If 7PM-7AM, please contact night-coverage [email or web address] 12/19/2020, 1:05 PM

## 2020-12-20 DIAGNOSIS — J690 Pneumonitis due to inhalation of food and vomit: Secondary | ICD-10-CM | POA: Diagnosis not present

## 2020-12-20 DIAGNOSIS — J9601 Acute respiratory failure with hypoxia: Secondary | ICD-10-CM | POA: Diagnosis not present

## 2020-12-20 DIAGNOSIS — R778 Other specified abnormalities of plasma proteins: Secondary | ICD-10-CM | POA: Diagnosis not present

## 2020-12-20 DIAGNOSIS — I639 Cerebral infarction, unspecified: Secondary | ICD-10-CM | POA: Diagnosis not present

## 2020-12-20 LAB — BASIC METABOLIC PANEL
Anion gap: 10 (ref 5–15)
BUN: 10 mg/dL (ref 8–23)
CO2: 28 mmol/L (ref 22–32)
Calcium: 9 mg/dL (ref 8.9–10.3)
Chloride: 103 mmol/L (ref 98–111)
Creatinine, Ser: 1.1 mg/dL — ABNORMAL HIGH (ref 0.44–1.00)
GFR, Estimated: 55 mL/min — ABNORMAL LOW (ref >60)
Glucose, Bld: 98 mg/dL (ref 70–99)
Potassium: 3.8 mmol/L (ref 3.5–5.1)
Sodium: 141 mmol/L (ref 135–145)

## 2020-12-20 LAB — CBC
HCT: 37.4 % (ref 36.0–46.0)
Hemoglobin: 12.1 g/dL (ref 12.0–15.0)
MCH: 30.2 pg (ref 26.0–34.0)
MCHC: 32.4 g/dL (ref 30.0–36.0)
MCV: 93.3 fL (ref 80.0–100.0)
Platelets: 315 10*3/uL (ref 150–400)
RBC: 4.01 MIL/uL (ref 3.87–5.11)
RDW: 13.7 % (ref 11.5–15.5)
WBC: 8.8 10*3/uL (ref 4.0–10.5)
nRBC: 0 % (ref 0.0–0.2)

## 2020-12-20 LAB — PROTIME-INR
INR: 1.2 (ref 0.8–1.2)
Prothrombin Time: 15.1 seconds (ref 11.4–15.2)

## 2020-12-20 LAB — MAGNESIUM: Magnesium: 2 mg/dL (ref 1.7–2.4)

## 2020-12-20 LAB — HEPARIN LEVEL (UNFRACTIONATED): Heparin Unfractionated: 0.62 IU/mL (ref 0.30–0.70)

## 2020-12-20 MED ORDER — WARFARIN SODIUM 5 MG PO TABS
5.0000 mg | ORAL_TABLET | Freq: Once | ORAL | Status: AC
  Administered 2020-12-20: 18:00:00 5 mg via ORAL
  Filled 2020-12-20: qty 1

## 2020-12-20 NOTE — Progress Notes (Signed)
PT Cancellation Note  Patient Details Name: Mindy Mills MRN: [id number] DOB: 27-Jun-1951   Cancelled Treatment:    Reason Eval/Treat Not Completed: Patient declined, no reason specified.  Pt difficult to understand due to expressive aphasia but adamantly shaking her head no to any mobility attempts. Offered bed exercises as an alternative but pt continued to shake her head no.    Lorriane Shire 12/20/2020, 11:52 AM  Lorrin Goodell, PT  Office # [phone] Pager [phone]

## 2020-12-20 NOTE — Progress Notes (Signed)
ANTICOAGULATION CONSULT NOTE - Follow Up Consult  Pharmacy Consult for Heparin>warfarin Indication: cardiac thrombus and stroke  Allergies  Allergen Reactions  . Bee Venom Anaphylaxis and Hives  . Capsicum (Cayenne) [Cayenne] Swelling    Patient Measurements: Height: 5\' 3"  (160 cm) Weight: 42.3 kg (93 lb 4.1 oz) IBW/kg (Calculated) : 52.4 Heparin Dosing Weight: 47.7 kg  Vital Signs: Temp: 97.7 F (36.5 C) (12/24 0656) Temp Source: Oral (12/24 0656) BP: 129/60 (12/24 0656) Pulse Rate: 62 (12/23 2300)  Labs: Recent Labs    12/18/20 0224 12/19/20 0315 12/20/20 0302  HGB 10.9* 11.5* 12.1  HCT 34.1* 35.9* 37.4  PLT 219 277 315  LABPROT 13.1 13.7 15.1  INR 1.0 1.1 1.2  HEPARINUNFRC 0.33 0.45 0.62  CREATININE 0.97 1.03* 1.10*    Estimated Creatinine Clearance: 32.7 mL/min (A) (by C-G formula based on SCr of 1.1 mg/dL (H)).  Assessment: 69 yr old female started on IV heparin on 12/10/20 for apical thrombus. Prior hx SAH, cleared for anticoagulation by Neuro. No plans for cath this admit. New CVA on 12/18, now using low therapeutic goal for heparin dosing.  Heparin level is above goal at 0.63, on 950 units/hr. CBC stable. INR= 1.2 (warfarin dose increased 12/22)  Goal of Therapy:  INR goal 2-3 Heparin level 0.3-0.5 units/ml Monitor platelets by anticoagulation protocol: Yes   Plan:  Decrease heparin to 900 units/hr Daily heparin level, CBC Warfarin 5 mg tonight Daily INR  Hildred Laser, PharmD Clinical Pharmacist **Pharmacist phone directory can now be found on [email or web address] ([email or web address]).  Listed under [place].

## 2020-12-20 NOTE — Progress Notes (Signed)
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 Patient has since had PEG tube and tracheostomy removed. She reports that for the past 3 days, she has progressive shortness of breath and cough. She denies any fever. She denies any vomiting. She has not felt as though she is been choking after eating or drinking. She has not had any chest pain. She feels increasingly weak. No dysuria, diarrhea. Since she had progressive shortness of breath, she called EMS for further evaluation  ED Course:Upon arrival to the emergency room, patient is noted to have a mildly elevated creatinine. Oxygen has been applied since she was noted to be hypoxic. Oxygen saturations were 88% on room air. D-dimer was noted to be elevated and CT of the chest was performed that indicated evidence of aspiration pneumonia. COVID-19 test was found to be negative. Troponin mildly elevated around 300 and EKG showed T wave inversions in the anterolateral leads. She was referred for admission.  AM 12/13/20--patient was noted to be dysphasic, not responding to questions nor following commands by RN, and had right hemiparesis. Code Stroke activated at 1100.Work up revealed new acute L-MCA territory stroke, likely cardioembolic. Teleneuro was activated and neurology was consulted. Heparin drip was continued per stroke protocol dosing. This is being transitioned to coumadin. Pharmacy is managing this.  The patient was transferred to [facility] for continued stroke care on 12/14/2020. Neurology was consulted. EEG was performed and was negative for evidence of seizures.  Assessment & Plan:  Acute Cardioembolic Stroke:  -In the setting of LV thrombus.  -CT brain was suspicious  for an acute left MCA infarct involving the insula and possibly the operculum.  -On 12/17 MR brain demonstrated an acute moderate size left MCA territory infarct. Discontiguous small acute infarct of the left caudate. Third small acute infarct of the right cerebellum. -On 12/17 CTA H&N--Left M2 superior division occlusion;Associated left MCA territory infarct without evidence of a significant penumbra on CTP.Patent carotid and vertebral arteries.  -Patient started on IV heparin which is transitioned to Coumadin as per pharmacy -Neurology signed off -PT/OT has recommended SNF  Acute respiratory failure with hypoxia: Secondary to aspiration pneumoniaand [facility] setting of COPD. The patient is currently saturating 94% on 2-3L by nasal cannula.  -12/1 CTA chest--no PE; extensive fluid and debris RLL bronchus.  -Finished Unasyn for 7 days.  Discontinued Unasyn on 12/23. -Evaluated by speech therapy recommended dysphagia 1 diet.  Acute combined systolic and diastolic CHF: As of echocardiogram performed on 12/09/2020 the patient's EF is 25-30% with Grade 2 diastolic dysfunction and an apical thrombus. There is also trivial MR. 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  There are plans for [facility] as outpatient.  COPD: No wheezing noted on exam.  The patient is continuing on duoneb, pulmicort, and brovana. She has incentive spirometry. She has a 50 pack her smoking history  Elevated troponin: The patient is without chest pain. She does have T wave inversions in the precordial leads. Echocardiogram shows EF of 25-30%. This is possibly due to demand ischemia in the setting of pneumonia or stress induced CM as she has a history of Takotsubo's CM. -plans for [facility] as outpatient when the patient has completely recovered from this stroke.   Apical thrombus: Source of cardioembonic CVA.  Continue Coumadin as per pharmacy.  History of SAH: Noted. 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 She has been seen by neurology and felt reasonable to start on dual antiplatelet therapy versus anticoagulation as needed.  CKDIIIa: Creatinine at baseline. Monitor creatinine, electrolytes, and volume status. Avoid nephrotoxins and hypotension.   Generalized weakness: The patient has been evaluated by physical therapy with recommendations for SNF.   Hypokalemia: Resolved  Severe Protein Calorie Malnutrition: Nutrition consulted. Continued supplements.    DVT prophylaxis: Coumadin Code Status: DNR Family Communication:  None present at bedside.  Plan of care discussed with patient in length and he verbalized understanding and agreed with it. Disposition Plan: SNF  Consultants:   Neurology  Procedures:   CT head  CTA head and neck  MRI brain  CT cerebral perfusion study  Antimicrobials:   Unasyn  Status is: Inpatient  Remains inpatient appropriate because:IV treatments appropriate due to intensity of illness or inability to take PO   Dispo: The patient is from: Home              Anticipated d/c is to: SNF              Anticipated d/c date is: 2 days              Patient currently is not medically stable to d/c.    Subjective: Patient seen and examined.  Sitting comfortably on the bed.  Has expressive aphasia.  No new complaints.  When I asked do you want me to call your family to update them she clearly nodded her head and said no.  Remained afebrile.  No acute events overnight.  Objective: Vitals:   12/19/20 2300 12/20/20 0656 12/20/20 0905 12/20/20 0930  BP: 115/75 129/60  (!) 118/56  Pulse: 62   71  Resp: 16 15    Temp: 97.6 F (36.4 C) 97.7 F (36.5 C)    TempSrc: Oral Oral    SpO2: 96% 94% 96%   Weight:  42.3 kg    Height:        Intake/Output Summary (Last 24 hours) at 12/20/2020 1524 Last data filed at 12/20/2020 0303 Gross per 24 hour  Intake 257.13 ml  Output --  Net 257.13 ml   Filed Weights   12/17/20 0437  12/19/20 0436 12/20/20 0656  Weight: 44.8 kg 45 kg 42.3 kg    Examination:  General exam: Appears calm and comfortable, thin and lean, on 2 L of oxygen via nasal cannula, alert and following commands. Respiratory system: Clear to auscultation. Respiratory effort normal. Cardiovascular system: S1 & S2 heard, RRR. No JVD, murmurs, rubs, gallops or clicks. No pedal edema. Gastrointestinal system: Abdomen is nondistended, soft and nontender. No organomegaly or masses felt. Normal bowel sounds heard. Central nervous system: Alert and following commands.  Has expressive aphasia. Skin: No rashes, lesions or ulcers Psychiatry: Judgement and insight appear normal. Mood & affect appropriate.    Data Reviewed: I have personally reviewed following labs and imaging studies  CBC: Recent Labs  Lab 12/16/20 0334 12/17/20 0227 12/18/20 0224 12/19/20 0315 12/20/20 0302  WBC 10.8* 9.4 9.4 8.5 8.8  NEUTROABS  --   --   --  5.4  --   HGB 11.5* 10.9* 10.9* 11.5* 12.1  HCT 36.0 33.0* 34.1* 35.9* 37.4  MCV 94.2 93.0 94.2 93.7 93.3  PLT 244 250 219 277 [id number]   Basic Metabolic Panel: Recent Labs  Lab  12/14/20 0426 12/15/20 0028 12/16/20 0334 12/17/20 0227 12/18/20 0224 12/19/20 0315 12/20/20 0302  NA 142 141 141 140 141 140 141  K 3.7 3.5 3.4* 2.7* 3.2* 3.4* 3.8  CL 107 103 102 100 104 103 103  CO2 27 25 27 27 26 28 28   GLUCOSE 82 97 107* 104* 88 103* 98  BUN 12 11 13 12 12 11 10   CREATININE 1.00  1.02* 1.18* 1.02* 0.95 0.97 1.03* 1.10*  CALCIUM 8.5* 8.5* 8.6* 8.6* 9.0 8.6* 9.0  MG 2.1 1.7  --   --  1.7  --  2.0   GFR: Estimated Creatinine Clearance: 32.7 mL/min (A) (by C-G formula based on SCr of 1.1 mg/dL (H)). Liver Function Tests: Recent Labs  Lab 12/14/20 0426  AST 21  ALT 21  ALKPHOS 45  BILITOT 0.6  PROT 5.8*  ALBUMIN 2.6*   No results for input(s): LIPASE, AMYLASE in the last 168 hours. No results for input(s): AMMONIA in the last 168 hours. Coagulation  Profile: Recent Labs  Lab 12/17/20 0227 12/18/20 0224 12/19/20 0315 12/20/20 0302  INR 1.1 1.0 1.1 1.2   Cardiac Enzymes: No results for input(s): CKTOTAL, CKMB, CKMBINDEX, TROPONINI in the last 168 hours. BNP (last 3 results) No results for input(s): PROBNP in the last 8760 hours. HbA1C: No results for input(s): HGBA1C in the last 72 hours. CBG: No results for input(s): GLUCAP in the last 168 hours. Lipid Profile: No results for input(s): CHOL, HDL, LDLCALC, TRIG, CHOLHDL, LDLDIRECT in the last 72 hours. Thyroid Function Tests: No results for input(s): TSH, T4TOTAL, FREET4, T3FREE, THYROIDAB in the last 72 hours. Anemia Panel: No results for input(s): VITAMINB12, FOLATE, FERRITIN, TIBC, IRON, RETICCTPCT in the last 72 hours. Sepsis Labs: No results for input(s): PROCALCITON, LATICACIDVEN in the last 168 hours.  Recent Results (from the past 240 hour(s))  Culture, Urine     Status: None   Collection Time: 12/13/20  6:25 PM   Specimen: Urine, Catheterized  Result Value Ref Range Status   Specimen Description   Final    URINE, CATHETERIZED Performed at [facility], [address], [place], [place] [address]    Special Requests   Final    NONE Performed at [facility], [address] [place], [place] [address]    Culture   Final    NO GROWTH Performed at [facility], [address] [place], [place] [address]    Report Status 12/15/2020 FINAL  Final  Gastrointestinal Panel by PCR , Stool     Status: None   Collection Time: 12/17/20 12:47 PM   Specimen: Stool  Result Value Ref Range Status   Campylobacter species NOT DETECTED NOT DETECTED Final   Plesimonas shigelloides NOT DETECTED NOT DETECTED Final   Salmonella species NOT DETECTED NOT DETECTED Final   Yersinia enterocolitica NOT DETECTED NOT DETECTED Final   Vibrio species NOT DETECTED NOT DETECTED Final   Vibrio cholerae NOT DETECTED NOT DETECTED Final   Enteroaggregative E coli (EAEC) NOT DETECTED NOT  DETECTED Final   Enteropathogenic E coli (EPEC) NOT DETECTED NOT DETECTED Final   Enterotoxigenic E coli (ETEC) NOT DETECTED NOT DETECTED Final   Shiga like toxin producing E coli (STEC) NOT DETECTED NOT DETECTED Final   Shigella/Enteroinvasive E coli (EIEC) NOT DETECTED NOT DETECTED Final   Cryptosporidium NOT DETECTED NOT DETECTED Final   Cyclospora cayetanensis NOT DETECTED NOT DETECTED Final   Entamoeba histolytica NOT DETECTED NOT DETECTED Final   Giardia lamblia NOT  DETECTED NOT DETECTED Final   Adenovirus F40/41 NOT DETECTED NOT DETECTED Final   Astrovirus NOT DETECTED NOT DETECTED Final   Norovirus GI/GII NOT DETECTED NOT DETECTED Final   Rotavirus A NOT DETECTED NOT DETECTED Final   Sapovirus (I, II, IV, and V) NOT DETECTED NOT DETECTED Final    Comment: Performed at [facility], [address] [place], [place] [address]      Radiology Studies: No results found.  Scheduled Meds: . arformoterol  15 mcg Nebulization BID  . budesonide (PULMICORT) nebulizer solution  0.5 mg Nebulization BID  . coumadin book   Does not apply Once  . feeding supplement  237 mL Oral BID BM  . FLUoxetine  80 mg Oral Daily  . furosemide  40 mg Oral Daily  . guaiFENesin  600 mg Oral BID  . metoprolol succinate  12.5 mg Oral Daily  . rosuvastatin  40 mg Oral Daily  . warfarin  5 mg Oral ONCE-1600  . Warfarin - Pharmacist Dosing Inpatient   Does not apply q1600   Continuous Infusions: . heparin 900 Units/hr (12/20/20 0934)     LOS: 13 days   Time spent: 35 minutes   Laurianne Floresca Loann Quill, MD [facility]  If 7PM-7AM, please contact night-coverage [email or web address] 12/20/2020, 3:24 PM

## 2020-12-21 DIAGNOSIS — R778 Other specified abnormalities of plasma proteins: Secondary | ICD-10-CM | POA: Diagnosis not present

## 2020-12-21 DIAGNOSIS — J9601 Acute respiratory failure with hypoxia: Secondary | ICD-10-CM | POA: Diagnosis not present

## 2020-12-21 LAB — BASIC METABOLIC PANEL
Anion gap: 11 (ref 5–15)
BUN: 13 mg/dL (ref 8–23)
CO2: 26 mmol/L (ref 22–32)
Calcium: 9.5 mg/dL (ref 8.9–10.3)
Chloride: 102 mmol/L (ref 98–111)
Creatinine, Ser: 1.22 mg/dL — ABNORMAL HIGH (ref 0.44–1.00)
GFR, Estimated: 48 mL/min — ABNORMAL LOW (ref >60)
Glucose, Bld: 105 mg/dL — ABNORMAL HIGH (ref 70–99)
Potassium: 4.2 mmol/L (ref 3.5–5.1)
Sodium: 139 mmol/L (ref 135–145)

## 2020-12-21 LAB — CBC
HCT: 36.3 % (ref 36.0–46.0)
Hemoglobin: 12.2 g/dL (ref 12.0–15.0)
MCH: 30.7 pg (ref 26.0–34.0)
MCHC: 33.6 g/dL (ref 30.0–36.0)
MCV: 91.2 fL (ref 80.0–100.0)
Platelets: 313 10*3/uL (ref 150–400)
RBC: 3.98 MIL/uL (ref 3.87–5.11)
RDW: 13.6 % (ref 11.5–15.5)
WBC: 9.7 10*3/uL (ref 4.0–10.5)
nRBC: 0 % (ref 0.0–0.2)

## 2020-12-21 LAB — HEPARIN LEVEL (UNFRACTIONATED): Heparin Unfractionated: 0.36 IU/mL (ref 0.30–0.70)

## 2020-12-21 LAB — PROTIME-INR
INR: 1.4 — ABNORMAL HIGH (ref 0.8–1.2)
Prothrombin Time: 16.6 seconds — ABNORMAL HIGH (ref 11.4–15.2)

## 2020-12-21 MED ORDER — WARFARIN SODIUM 5 MG PO TABS
5.0000 mg | ORAL_TABLET | Freq: Once | ORAL | Status: AC
  Administered 2020-12-21: 17:00:00 5 mg via ORAL
  Filled 2020-12-21: qty 1

## 2020-12-21 MED ORDER — SODIUM CHLORIDE 0.9 % IV SOLN: Freq: Once | INTRAVENOUS | Status: AC

## 2020-12-21 NOTE — Plan of Care (Signed)
  Problem: Education: Goal: Knowledge of General Education information will improve Description: Including pain rating scale, medication(s)/side effects and non-pharmacologic comfort measures Outcome: Progressing   Problem: Health Behavior/Discharge Planning: Goal: Ability to manage health-related needs will improve Outcome: Progressing   Problem: Clinical Measurements: Goal: Ability to maintain clinical measurements within normal limits will improve Outcome: Progressing Goal: Respiratory complications will improve Outcome: Progressing   Problem: Coping: Goal: Will identify appropriate support needs Outcome: Progressing   Problem: Education: Goal: Knowledge of secondary prevention will improve Outcome: Progressing Goal: Knowledge of patient specific risk factors addressed and post discharge goals established will improve Outcome: Progressing Goal: Individualized Educational Video(s) Outcome: Progressing

## 2020-12-21 NOTE — Progress Notes (Signed)
ANTICOAGULATION CONSULT NOTE - Follow Up Consult  Pharmacy Consult for Heparin>warfarin Indication: cardiac thrombus and stroke  Allergies  Allergen Reactions  . Bee Venom Anaphylaxis and Hives  . Capsicum (Cayenne) [Cayenne] Swelling    Patient Measurements: Height: 5\' 3"  (160 cm) Weight: 42.3 kg (93 lb 4.1 oz) IBW/kg (Calculated) : 52.4 Heparin Dosing Weight: 47.7 kg  Vital Signs: Temp: 98.8 F (37.1 C) (12/24 2055) Temp Source: Oral (12/24 2055) BP: 129/70 (12/24 2055) Pulse Rate: 77 (12/24 2055)  Labs: Recent Labs    12/19/20 0315 12/20/20 0302 12/21/20 0506  HGB 11.5* 12.1 12.2  HCT 35.9* 37.4 36.3  PLT 277 315 313  LABPROT 13.7 15.1 16.6*  INR 1.1 1.2 1.4*  HEPARINUNFRC 0.45 0.62 0.36  CREATININE 1.03* 1.10* 1.22*    Estimated Creatinine Clearance: 29.5 mL/min (A) (by C-G formula based on SCr of 1.22 mg/dL (H)).  Assessment: 69 yr old female started on IV heparin on 12/10/20 for apical thrombus. Prior hx SAH, cleared for anticoagulation by Neuro. No plans for cath this admit. New CVA on 12/18, now using low therapeutic goal for heparin dosing.  Heparin level is now at goal after decreasing rate to 900 units/hr yesterday. CBC stable. INR subtherapeutic at 1.4, slowly increasing.   Goal of Therapy:  INR goal 2-3 Heparin level 0.3-0.5 units/ml Monitor platelets by anticoagulation protocol: Yes   Plan:  Continue heparin 900 units/hr Daily heparin level, CBC Warfarin 5 mg tonight Daily INR  Rebbeca Paul, PharmD PGY1 Pharmacy Resident 12/21/2020 7:35 AM  Please check [email or web address] for unit-specific pharmacy phone numbers.

## 2020-12-21 NOTE — Progress Notes (Signed)
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 Patient has since had PEG tube and tracheostomy removed. She reports that for the past 3 days, she has progressive shortness of breath and cough. She denies any fever. She denies any vomiting. She has not felt as though she is been choking after eating or drinking. She has not had any chest pain. She feels increasingly weak. No dysuria, diarrhea. Since she had progressive shortness of breath, she called EMS for further evaluation  ED Course:Upon arrival to the emergency room, patient is noted to have a mildly elevated creatinine. Oxygen has been applied since she was noted to be hypoxic. Oxygen saturations were 88% on room air. D-dimer was noted to be elevated and CT of the chest was performed that indicated evidence of aspiration pneumonia. COVID-19 test was found to be negative. Troponin mildly elevated around 300 and EKG showed T wave inversions in the anterolateral leads. She was referred for admission.  AM 12/13/20--patient was noted to be dysphasic, not responding to questions nor following commands by RN, and had right hemiparesis. Code Stroke activated at 1100.Work up revealed new acute L-MCA territory stroke, likely cardioembolic. Teleneuro was activated and neurology was consulted. Heparin drip was continued per stroke protocol dosing. This is being transitioned to coumadin. Pharmacy is managing this.  The patient was transferred to [facility] for continued stroke care on 12/14/2020. Neurology was consulted. EEG was performed and was negative for evidence of seizures.  Assessment & Plan:  Acute Cardioembolic Stroke:  -In the setting of LV thrombus.  -CT brain was suspicious  for an acute left MCA infarct involving the insula and possibly the operculum.  -On 12/17 MR brain demonstrated an acute moderate size left MCA territory infarct. Discontiguous small acute infarct of the left caudate. Third small acute infarct of the right cerebellum. -On 12/17 CTA H&N--Left M2 superior division occlusion;Associated left MCA territory infarct without evidence of a significant penumbra on CTP.Patent carotid and vertebral arteries.  -Patient started on IV heparin which is transitioned to Coumadin as per pharmacy -Neurology signed off -PT/OT has recommended SNF  Acute respiratory failure with hypoxia: Secondary to aspiration pneumoniaand CHFin setting of COPD. The patient is currently saturating 94% on 2-3L by nasal cannula.  -12/1 CTA chest--no PE; extensive fluid and debris RLL bronchus.  -Finished Unasyn for 7 days.  Discontinued Unasyn on 12/23. -Evaluated by speech therapy recommended dysphagia 1 diet.  Acute combined systolic and diastolic CHF: As of echocardiogram performed on 12/09/2020 the patient's EF is 25-30% with Grade 2 diastolic dysfunction and an apical thrombus. There is also trivial MR. 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  There are plans for [facility] as outpatient. -Strict INO's and daily weight.  Monitor signs for fluid overload.  COPD: No wheezing noted on exam.  The patient is continuing on duoneb, pulmicort, and brovana. She has incentive spirometry. She has a 50 pack her smoking history  Elevated troponin: The patient is without chest pain. She does have T wave inversions in the precordial leads. Echocardiogram shows EF of 25-30%. This is possibly due to demand ischemia in the setting of pneumonia or stress induced CM as she has a history of Takotsubo's CM. -plans for [facility] as outpatient when the patient has completely recovered  from this stroke.   Apical thrombus: Source of cardioembonic CVA. Continue Coumadin as per pharmacy.  History  of SAH: Noted. 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 She has been seen by neurology and felt reasonable to start on dual antiplatelet therapy versus anticoagulation as needed.  AKI on CKDIIIa: GFR slightly trended down from 55-48.  Hold Lasix.  Start patient on gentle hydration.  Repeat BMP tomorrow a.m.  Generalized weakness: The patient has been evaluated by physical therapy with recommendations for SNF.   Hypokalemia: Resolved  Severe Protein Calorie Malnutrition: Nutrition consulted. Continued supplements.    DVT prophylaxis: Coumadin Code Status: DNR Family Communication:  None present at bedside.  Plan of care discussed with patient in length and he verbalized understanding and agreed with it. Disposition Plan: SNF  Consultants:   Neurology  Procedures:   CT head  CTA head and neck  MRI brain  CT cerebral perfusion study  Antimicrobials:   Unasyn  Status is: Inpatient   Dispo: The patient is from: Home              Anticipated d/c is to: SNF              Anticipated d/c date is: 2 days              Patient currently is not medically stable to d/c.    Subjective: Patient seen and examined.  Resting comfortably on the bed.  She has expressive aphasia.  No acute events overnight.  Remained afebrile.  On 2 L of oxygen via nasal cannula.  Objective: Vitals:   12/20/20 2055 12/21/20 0837 12/21/20 0927 12/21/20 1200  BP: 129/70  98/69 117/63  Pulse: 77 70 72 72  Resp: 16 18 18 18   Temp: 98.8 F (37.1 C)  97.7 F (36.5 C) 98.8 F (37.1 C)  TempSrc: Oral  Oral Oral  SpO2: 98%  97% 98%  Weight:      Height:       No intake or output data in the 24 hours ending 12/21/20 1252 Filed Weights   12/17/20 0437 12/19/20 0436 12/20/20 0656  Weight: 44.8 kg 45 kg 42.3 kg    Examination:  General exam: Appears calm and comfortable, thin and lean, on 2 L of oxygen via nasal cannula, alert and following  commands. Respiratory system: Clear to auscultation. Respiratory effort normal. Cardiovascular system: S1 & S2 heard, RRR. No JVD, murmurs, rubs, gallops or clicks. No pedal edema. Gastrointestinal system: Abdomen is nondistended, soft and nontender. No organomegaly or masses felt. Normal bowel sounds heard. Central nervous system: Alert and following commands.  Has expressive aphasia. Skin: No rashes, lesions or ulcers   Data Reviewed: I have personally reviewed following labs and imaging studies  CBC: Recent Labs  Lab 12/17/20 0227 12/18/20 0224 12/19/20 0315 12/20/20 0302 12/21/20 0506  WBC 9.4 9.4 8.5 8.8 9.7  NEUTROABS  --   --  5.4  --   --   HGB 10.9* 10.9* 11.5* 12.1 12.2  HCT 33.0* 34.1* 35.9* 37.4 36.3  MCV 93.0 94.2 93.7 93.3 91.2  PLT 250 219 277 315 [id number]   Basic Metabolic Panel: Recent Labs  Lab 12/15/20 0028 12/16/20 0334 12/17/20 0227 12/18/20 0224 12/19/20 0315 12/20/20 0302 12/21/20 0506  NA 141   < > 140 141 140 141 139  K 3.5   < > 2.7* 3.2* 3.4* 3.8 4.2  CL 103   < >  100 104 103 103 102  CO2 25   < > 27 26 28 28 26   GLUCOSE 97   < > 104* 88 103* 98 105*  BUN 11   < > 12 12 11 10 13   CREATININE 1.18*   < > 0.95 0.97 1.03* 1.10* 1.22*  CALCIUM 8.5*   < > 8.6* 9.0 8.6* 9.0 9.5  MG 1.7  --   --  1.7  --  2.0  --    < > = values in this interval not displayed.   GFR: Estimated Creatinine Clearance: 29.5 mL/min (A) (by C-G formula based on SCr of 1.22 mg/dL (H)). Liver Function Tests: No results for input(s): AST, ALT, ALKPHOS, BILITOT, PROT, ALBUMIN in the last 168 hours. No results for input(s): LIPASE, AMYLASE in the last 168 hours. No results for input(s): AMMONIA in the last 168 hours. Coagulation Profile: Recent Labs  Lab 12/17/20 0227 12/18/20 0224 12/19/20 0315 12/20/20 0302 12/21/20 0506  INR 1.1 1.0 1.1 1.2 1.4*   Cardiac Enzymes: No results for input(s): CKTOTAL, CKMB, CKMBINDEX, TROPONINI in the last 168 hours. BNP (last 3  results) No results for input(s): PROBNP in the last 8760 hours. HbA1C: No results for input(s): HGBA1C in the last 72 hours. CBG: No results for input(s): GLUCAP in the last 168 hours. Lipid Profile: No results for input(s): CHOL, HDL, LDLCALC, TRIG, CHOLHDL, LDLDIRECT in the last 72 hours. Thyroid Function Tests: No results for input(s): TSH, T4TOTAL, FREET4, T3FREE, THYROIDAB in the last 72 hours. Anemia Panel: No results for input(s): VITAMINB12, FOLATE, FERRITIN, TIBC, IRON, RETICCTPCT in the last 72 hours. Sepsis Labs: No results for input(s): PROCALCITON, LATICACIDVEN in the last 168 hours.  Recent Results (from the past 240 hour(s))  Culture, Urine     Status: None   Collection Time: 12/13/20  6:25 PM   Specimen: Urine, Catheterized  Result Value Ref Range Status   Specimen Description   Final    URINE, CATHETERIZED Performed at [facility], [address] [place], [place] [address]    Special Requests   Final    NONE Performed at [facility], [address], [place], [place] [address]    Culture   Final    NO GROWTH Performed at [facility], [place] [address] [place],  [address]    Report Status 12/15/2020 FINAL  Final  Gastrointestinal Panel by PCR , Stool     Status: None   Collection Time: 12/17/20 12:47 PM   Specimen: Stool  Result Value Ref Range Status   Campylobacter species NOT DETECTED NOT DETECTED Final   Plesimonas shigelloides NOT DETECTED NOT DETECTED Final   Salmonella species NOT DETECTED NOT DETECTED Final   Yersinia enterocolitica NOT DETECTED NOT DETECTED Final   Vibrio species NOT DETECTED NOT DETECTED Final   Vibrio cholerae NOT DETECTED NOT DETECTED Final   Enteroaggregative E coli (EAEC) NOT DETECTED NOT DETECTED Final   Enteropathogenic E coli (EPEC) NOT DETECTED NOT DETECTED Final   Enterotoxigenic E coli (ETEC) NOT DETECTED NOT DETECTED Final   Shiga like toxin producing E coli (STEC) NOT DETECTED NOT DETECTED Final    Shigella/Enteroinvasive E coli (EIEC) NOT DETECTED NOT DETECTED Final   Cryptosporidium NOT DETECTED NOT DETECTED Final   Cyclospora cayetanensis NOT DETECTED NOT DETECTED Final   Entamoeba histolytica NOT DETECTED NOT DETECTED Final   Giardia lamblia NOT DETECTED NOT DETECTED Final   Adenovirus F40/41 NOT DETECTED NOT DETECTED Final   Astrovirus NOT DETECTED  NOT DETECTED Final   Norovirus GI/GII NOT DETECTED NOT DETECTED Final   Rotavirus A NOT DETECTED NOT DETECTED Final   Sapovirus (I, II, IV, and V) NOT DETECTED NOT DETECTED Final    Comment: Performed at [facility], [address], [place], [place] [address]      Radiology Studies: No results found.  Scheduled Meds: . arformoterol  15 mcg Nebulization BID  . budesonide (PULMICORT) nebulizer solution  0.5 mg Nebulization BID  . coumadin book   Does not apply Once  . feeding supplement  237 mL Oral BID BM  . FLUoxetine  80 mg Oral Daily  . guaiFENesin  600 mg Oral BID  . metoprolol succinate  12.5 mg Oral Daily  . rosuvastatin  40 mg Oral Daily  . warfarin  5 mg Oral ONCE-1600  . Warfarin - Pharmacist Dosing Inpatient   Does not apply q1600   Continuous Infusions: . heparin 900 Units/hr (12/21/20 0025)     LOS: 14 days   Time spent: 35 minutes   Mindy Bolon Loann Quill, MD [facility]  If 7PM-7AM, please contact night-coverage [email or web address] 12/21/2020, 12:52 PM

## 2020-12-22 DIAGNOSIS — J9601 Acute respiratory failure with hypoxia: Secondary | ICD-10-CM | POA: Diagnosis not present

## 2020-12-22 DIAGNOSIS — I5021 Acute systolic (congestive) heart failure: Secondary | ICD-10-CM | POA: Diagnosis not present

## 2020-12-22 DIAGNOSIS — I639 Cerebral infarction, unspecified: Secondary | ICD-10-CM | POA: Diagnosis not present

## 2020-12-22 LAB — CBC
HCT: 36 % (ref 36.0–46.0)
Hemoglobin: 11.5 g/dL — ABNORMAL LOW (ref 12.0–15.0)
MCH: 30.1 pg (ref 26.0–34.0)
MCHC: 31.9 g/dL (ref 30.0–36.0)
MCV: 94.2 fL (ref 80.0–100.0)
Platelets: 285 10*3/uL (ref 150–400)
RBC: 3.82 MIL/uL — ABNORMAL LOW (ref 3.87–5.11)
RDW: 13.7 % (ref 11.5–15.5)
WBC: 7.7 10*3/uL (ref 4.0–10.5)
nRBC: 0 % (ref 0.0–0.2)

## 2020-12-22 LAB — BASIC METABOLIC PANEL
Anion gap: 10 (ref 5–15)
BUN: 12 mg/dL (ref 8–23)
CO2: 25 mmol/L (ref 22–32)
Calcium: 9.1 mg/dL (ref 8.9–10.3)
Chloride: 104 mmol/L (ref 98–111)
Creatinine, Ser: 1.17 mg/dL — ABNORMAL HIGH (ref 0.44–1.00)
GFR, Estimated: 51 mL/min — ABNORMAL LOW (ref >60)
Glucose, Bld: 92 mg/dL (ref 70–99)
Potassium: 4.4 mmol/L (ref 3.5–5.1)
Sodium: 139 mmol/L (ref 135–145)

## 2020-12-22 LAB — HEPARIN LEVEL (UNFRACTIONATED): Heparin Unfractionated: 0.39 IU/mL (ref 0.30–0.70)

## 2020-12-22 LAB — PROTIME-INR
INR: 1.5 — ABNORMAL HIGH (ref 0.8–1.2)
Prothrombin Time: 17.8 seconds — ABNORMAL HIGH (ref 11.4–15.2)

## 2020-12-22 MED ORDER — FUROSEMIDE 40 MG PO TABS
40.0000 mg | ORAL_TABLET | Freq: Every day | ORAL | Status: DC
  Administered 2020-12-22 – 2020-12-23 (×2): 40 mg via ORAL
  Filled 2020-12-22 (×2): qty 1

## 2020-12-22 MED ORDER — WARFARIN SODIUM 5 MG PO TABS
5.0000 mg | ORAL_TABLET | Freq: Once | ORAL | Status: AC
  Administered 2020-12-22: 16:00:00 5 mg via ORAL
  Filled 2020-12-22: qty 1

## 2020-12-22 NOTE — Progress Notes (Signed)
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 Patient has since had PEG tube and tracheostomy removed. She reports that for the past 3 days, she has progressive shortness of breath and cough. She denies any fever. She denies any vomiting. She has not felt as though she is been choking after eating or drinking. She has not had any chest pain. She feels increasingly weak. No dysuria, diarrhea. Since she had progressive shortness of breath, she called EMS for further evaluation  ED Course:Upon arrival to the emergency room, patient is noted to have a mildly elevated creatinine. Oxygen has been applied since she was noted to be hypoxic. Oxygen saturations were 88% on room air. D-dimer was noted to be elevated and CT of the chest was performed that indicated evidence of aspiration pneumonia. COVID-19 test was found to be negative. Troponin mildly elevated around 300 and EKG showed T wave inversions in the anterolateral leads. She was referred for admission.  AM 12/13/20--patient was noted to be dysphasic, not responding to questions nor following commands by RN, and had right hemiparesis. Code Stroke activated at 1100.Work up revealed new acute L-MCA territory stroke, likely cardioembolic. Teleneuro was activated and neurology was consulted. Heparin drip was continued per stroke protocol dosing. This is being transitioned to coumadin. Pharmacy is managing this.  The patient was transferred to [facility] for continued stroke care on 12/14/2020. Neurology was consulted. EEG was performed and was negative for evidence of seizures.  Assessment & Plan:  Acute Cardioembolic Stroke:  -In the setting of LV thrombus.  -CT brain was suspicious  for an acute left MCA infarct involving the insula and possibly the operculum.  -On 12/17 MR brain demonstrated an acute moderate size left MCA territory infarct. Discontiguous small acute infarct of the left caudate. Third small acute infarct of the right cerebellum. -On 12/17 CTA H&N--Left M2 superior division occlusion;Associated left MCA territory infarct without evidence of a significant penumbra on CTP.Patent carotid and vertebral arteries.  -Patient started on IV heparin which is transitioned to Coumadin as per pharmacy -Neurology signed off -PT/OT has recommended SNF  Acute respiratory failure with hypoxia: Secondary to aspiration pneumoniaand CHFin setting of COPD. The patient is currently saturating 94% on 2-3L by nasal cannula.  -12/1 CTA chest--no PE; extensive fluid and debris RLL bronchus.  -Finished Unasyn for 7 days.  Discontinued Unasyn on 12/23. -Evaluated by speech therapy recommended dysphagia 1 diet.  Acute combined systolic and diastolic CHF: As of echocardiogram performed on 12/09/2020 the patient's EF is 25-30% with Grade 2 diastolic dysfunction and an apical thrombus. There is also trivial MR. 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  There are plans for [facility] as outpatient. -Strict INO's and daily weight.  Monitor signs for fluid overload.  COPD: No wheezing noted on exam.  The patient is continuing on duoneb, pulmicort, and brovana. She has incentive spirometry. She has a 50 pack her smoking history  Elevated troponin: The patient is without chest pain. She does have T wave inversions in the precordial leads. Echocardiogram shows EF of 25-30%. This is possibly due to demand ischemia in the setting of pneumonia or stress induced CM as she has a history of Takotsubo's CM. -plans for [facility] as outpatient when the patient has completely recovered  from this stroke.   Apical thrombus: Source of cardioembonic CVA. Continue Coumadin as per pharmacy.  History  of SAH: Noted. 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 She has been seen by neurology and felt reasonable to start on dual antiplatelet therapy versus anticoagulation as needed.  AKI on CKDIIIa: -Kidney function improved with gentle hydration.  Discontinued IV fluid considering underlying CHF with low ejection fraction.  Monitor kidney function closely.  Resume Lasix.  Generalized weakness: The patient has been evaluated by physical therapy with recommendations for SNF.   Hypokalemia: Resolved  Severe Protein Calorie Malnutrition: Nutrition consulted. Continued supplements.    DVT prophylaxis: Coumadin Code Status: DNR Family Communication:  None present at bedside.  Plan of care discussed with patient in length and he verbalized understanding and agreed with it.  I called patient's daughter-in-law Cyril Mourning (she is a Marine scientist and works here at [employer] is not a power of attorney however she is the only close relative who makes decision for the patient.  Patient clearly cannot make decisions for herself.  I discussed with her about patient's current status and a need of SNF placement and she agreed and wishes to proceed.  Disposition Plan: SNF  Consultants:   Neurology  Procedures:   CT head  CTA head and neck  MRI brain  CT cerebral perfusion study  Antimicrobials:   Unasyn  Status is: Inpatient   Dispo: The patient is from: Home              Anticipated d/c is to: SNF              Anticipated d/c date is: 2 days              Patient currently is not medically stable to d/c.    Subjective: Patient seen and examined.  I tried to communicate with her via sign board in the presence of patient's RN.  When asked that if she is comfortable going to nursing home-initially she pointed to yes and then no.  When I asked her do you want me to talk to your family- initially she pointed to yes and then no.  She remained afebrile.  No acute events  overnight reported.  Objective: Vitals:   12/21/20 2047 12/22/20 0516 12/22/20 0907 12/22/20 0935  BP: 111/65 97/64 (!) 112/54   Pulse: 63 62 74   Resp: 19 18  18   Temp: 99.1 F (37.3 C) 98.4 F (36.9 C)  98.3 F (36.8 C)  TempSrc: Oral Oral  Axillary  SpO2: 99% 98%    Weight:  43.2 kg    Height:        Intake/Output Summary (Last 24 hours) at 12/22/2020 1449 Last data filed at 12/22/2020 0139 Gross per 24 hour  Intake 491.81 ml  Output 400 ml  Net 91.81 ml   Filed Weights   12/19/20 0436 12/20/20 0656 12/22/20 0516  Weight: 45 kg 42.3 kg 43.2 kg    Examination:  General exam: Appears calm and comfortable, thin and lean, on 2 L of oxygen via nasal cannula, alert and following commands. Respiratory system: Clear to auscultation. Respiratory effort normal. Cardiovascular system: S1 & S2 heard, RRR. No JVD, murmurs, rubs, gallops or clicks. No pedal edema. Gastrointestinal system: Abdomen is nondistended, soft and nontender. No organomegaly or masses felt. Normal bowel sounds heard. Central nervous system: Alert and following commands.  Has expressive aphasia. Skin: No rashes, lesions or ulcers  Data Reviewed: I have personally reviewed following labs and imaging studies  CBC: Recent Labs  Lab 12/18/20 0224 12/19/20 0315 12/20/20 0302 12/21/20 0506 12/22/20 0515  WBC 9.4 8.5 8.8 9.7 7.7  NEUTROABS  --  5.4  --   --   --   HGB 10.9* 11.5* 12.1 12.2 11.5*  HCT 34.1* 35.9* 37.4 36.3 36.0  MCV 94.2 93.7 93.3 91.2 94.2  PLT 219 277 315 313 [id number]   Basic Metabolic Panel: Recent Labs  Lab 12/18/20 0224 12/19/20 0315 12/20/20 0302 12/21/20 0506 12/22/20 0515  NA 141 140 141 139 139  K 3.2* 3.4* 3.8 4.2 4.4  CL 104 103 103 102 104  CO2 26 28 28 26 25   GLUCOSE 88 103* 98 105* 92  BUN 12 11 10 13 12   CREATININE 0.97 1.03* 1.10* 1.22* 1.17*  CALCIUM 9.0 8.6* 9.0 9.5 9.1  MG 1.7  --  2.0  --   --    GFR: Estimated Creatinine Clearance: 31.4 mL/min (A) (by  C-G formula based on SCr of 1.17 mg/dL (H)). Liver Function Tests: No results for input(s): AST, ALT, ALKPHOS, BILITOT, PROT, ALBUMIN in the last 168 hours. No results for input(s): LIPASE, AMYLASE in the last 168 hours. No results for input(s): AMMONIA in the last 168 hours. Coagulation Profile: Recent Labs  Lab 12/18/20 0224 12/19/20 0315 12/20/20 0302 12/21/20 0506 12/22/20 0515  INR 1.0 1.1 1.2 1.4* 1.5*   Cardiac Enzymes: No results for input(s): CKTOTAL, CKMB, CKMBINDEX, TROPONINI in the last 168 hours. BNP (last 3 results) No results for input(s): PROBNP in the last 8760 hours. HbA1C: No results for input(s): HGBA1C in the last 72 hours. CBG: No results for input(s): GLUCAP in the last 168 hours. Lipid Profile: No results for input(s): CHOL, HDL, LDLCALC, TRIG, CHOLHDL, LDLDIRECT in the last 72 hours. Thyroid Function Tests: No results for input(s): TSH, T4TOTAL, FREET4, T3FREE, THYROIDAB in the last 72 hours. Anemia Panel: No results for input(s): VITAMINB12, FOLATE, FERRITIN, TIBC, IRON, RETICCTPCT in the last 72 hours. Sepsis Labs: No results for input(s): PROCALCITON, LATICACIDVEN in the last 168 hours.  Recent Results (from the past 240 hour(s))  Culture, Urine     Status: None   Collection Time: 12/13/20  6:25 PM   Specimen: Urine, Catheterized  Result Value Ref Range Status   Specimen Description   Final    URINE, CATHETERIZED Performed at [facility], [address] [place], [place] [address]    Special Requests   Final    NONE Performed at [facility], [address], [place], [place] [address]    Culture   Final    NO GROWTH Performed at [facility], [place] [address], [place], [place] [address]    Report Status 12/15/2020 FINAL  Final  Gastrointestinal Panel by PCR , Stool     Status: None   Collection Time: 12/17/20 12:47 PM   Specimen: Stool  Result Value Ref Range Status   Campylobacter species NOT DETECTED NOT DETECTED Final    Plesimonas shigelloides NOT DETECTED NOT DETECTED Final   Salmonella species NOT DETECTED NOT DETECTED Final   Yersinia enterocolitica NOT DETECTED NOT DETECTED Final   Vibrio species NOT DETECTED NOT DETECTED Final   Vibrio cholerae NOT DETECTED NOT DETECTED Final   Enteroaggregative E coli (EAEC) NOT DETECTED NOT DETECTED Final   Enteropathogenic E coli (EPEC) NOT DETECTED NOT DETECTED Final   Enterotoxigenic E coli (ETEC) NOT DETECTED NOT DETECTED Final  Shiga like toxin producing E coli (STEC) NOT DETECTED NOT DETECTED Final   Shigella/Enteroinvasive E coli (EIEC) NOT DETECTED NOT DETECTED Final   Cryptosporidium NOT DETECTED NOT DETECTED Final   Cyclospora cayetanensis NOT DETECTED NOT DETECTED Final   Entamoeba histolytica NOT DETECTED NOT DETECTED Final   Giardia lamblia NOT DETECTED NOT DETECTED Final   Adenovirus F40/41 NOT DETECTED NOT DETECTED Final   Astrovirus NOT DETECTED NOT DETECTED Final   Norovirus GI/GII NOT DETECTED NOT DETECTED Final   Rotavirus A NOT DETECTED NOT DETECTED Final   Sapovirus (I, II, IV, and V) NOT DETECTED NOT DETECTED Final    Comment: Performed at [facility], [address] [place], [place] [address]      Radiology Studies: No results found.  Scheduled Meds: . arformoterol  15 mcg Nebulization BID  . budesonide (PULMICORT) nebulizer solution  0.5 mg Nebulization BID  . coumadin book   Does not apply Once  . feeding supplement  237 mL Oral BID BM  . FLUoxetine  80 mg Oral Daily  . furosemide  40 mg Oral Daily  . guaiFENesin  600 mg Oral BID  . metoprolol succinate  12.5 mg Oral Daily  . rosuvastatin  40 mg Oral Daily  . warfarin  5 mg Oral ONCE-1600  . Warfarin - Pharmacist Dosing Inpatient   Does not apply q1600   Continuous Infusions: . heparin 900 Units/hr (12/22/20 0001)     LOS: 15 days   Time spent: 35 minutes   Kenlee Maler Loann Quill, MD [facility]  If 7PM-7AM, please contact  night-coverage [email or web address] 12/22/2020, 2:49 PM

## 2020-12-22 NOTE — TOC Progression Note (Signed)
Transition of Care [facility]) - Progression Note    Patient Details  Name: VONETTE GROSSO MRN: [id number] Date of Birth: 09-11-1951  Transition of [facility] CM/SW [place], [place] Phone Number: [phone] 12/22/2020, 10:14 AM  Clinical Narrative:     CSW and MD Pahwani have been in discussions about patient's ability to make own decisions. CSW was alerted by MDPahwani that patient does not have capacity. CSW attempted to follow up with relative Erasmo Downer however had to leave message.  TOC team will continue to assist with discharge planning needs.  Expected Discharge Plan: [place] Barriers to Discharge: Continued Medical Work up  Expected Discharge Plan and Services Expected Discharge Plan: [place] In-house Referral: Clinical Social Work     Living arrangements for the past 2 months: Single Family Home                                       Social Determinants of Health (SDOH) Interventions    Readmission Risk Interventions No flowsheet data found.

## 2020-12-22 NOTE — TOC Progression Note (Signed)
Transition of Care [facility]) - Progression Note    Patient Details  Name: Mindy Mills MRN: [id number] Date of Birth: 01-Nov-1951  Transition of Care [facility]) CM/SW [place], [place] Phone Number: [phone] 12/22/2020, 3:30 PM  Clinical Narrative:     CSW spoke with Erasmo Downer and she is in agreement with patient going to a SNF however she wants patient to go to a facility in [place]. CSW explained that more referrals will be sent to facilities in [place]. CSW directed Erasmo Downer to the medicare.gov website to review ratings.  TOC team will continue to assist with discharge planning needs.  Expected Discharge Plan: [place] Barriers to Discharge: Continued Medical Work up  Expected Discharge Plan and Services Expected Discharge Plan: [place] In-house Referral: Clinical Social Work     Living arrangements for the past 2 months: Single Family Home                                       Social Determinants of Health (SDOH) Interventions    Readmission Risk Interventions No flowsheet data found.

## 2020-12-22 NOTE — Progress Notes (Signed)
ANTICOAGULATION CONSULT NOTE - Follow Up Consult  Pharmacy Consult for Heparin>warfarin Indication: cardiac thrombus and stroke  Allergies  Allergen Reactions  . Bee Venom Anaphylaxis and Hives  . Capsicum (Cayenne) [Cayenne] Swelling    Patient Measurements: Height: 5\' 3"  (160 cm) Weight: 43.2 kg (95 lb 3.8 oz) IBW/kg (Calculated) : 52.4 Heparin Dosing Weight: 47.7 kg  Vital Signs: Temp: 98.4 F (36.9 C) (12/26 0516) Temp Source: Oral (12/26 0516) BP: 97/64 (12/26 0516) Pulse Rate: 62 (12/26 0516)  Labs: Recent Labs    12/20/20 0302 12/21/20 0506 12/22/20 0515  HGB 12.1 12.2 11.5*  HCT 37.4 36.3 36.0  PLT 315 313 285  LABPROT 15.1 16.6* 17.8*  INR 1.2 1.4* 1.5*  HEPARINUNFRC 0.62 0.36 0.39  CREATININE 1.10* 1.22* 1.17*    Estimated Creatinine Clearance: 31.4 mL/min (A) (by C-G formula based on SCr of 1.17 mg/dL (H)).  Assessment: 69 yr old female started on IV heparin on 12/10/20 for apical thrombus. Prior hx SAH, cleared for anticoagulation by Neuro. No plans for cath this admit. New CVA on 12/18, now using low therapeutic goal for heparin dosing.  Heparin level remains therapeutic with gtt at 900 units/hr. INR subtherapeutic at 1.5 but slowly trending up. Likely not seeing effects of increased dosing yet (increased 12/24), will continue current dosing for today to prevent overshooting INR. No bleeding noted.   Goal of Therapy:  INR goal 2-3 Heparin level 0.3-0.5 units/ml Monitor platelets by anticoagulation protocol: Yes   Plan:  Continue heparin 900 units/hr Daily heparin level, CBC Warfarin 5 mg tonight Daily INR  Rebbeca Paul, PharmD PGY1 Pharmacy Resident 12/22/2020 7:27 AM  Please check [email or web address] for unit-specific pharmacy phone numbers.

## 2020-12-23 DIAGNOSIS — I639 Cerebral infarction, unspecified: Secondary | ICD-10-CM | POA: Diagnosis not present

## 2020-12-23 DIAGNOSIS — J9601 Acute respiratory failure with hypoxia: Secondary | ICD-10-CM | POA: Diagnosis not present

## 2020-12-23 DIAGNOSIS — J690 Pneumonitis due to inhalation of food and vomit: Secondary | ICD-10-CM | POA: Diagnosis not present

## 2020-12-23 LAB — BASIC METABOLIC PANEL
Anion gap: 12 (ref 5–15)
BUN: 16 mg/dL (ref 8–23)
CO2: 26 mmol/L (ref 22–32)
Calcium: 9.4 mg/dL (ref 8.9–10.3)
Chloride: 102 mmol/L (ref 98–111)
Creatinine, Ser: 1.2 mg/dL — ABNORMAL HIGH (ref 0.44–1.00)
GFR, Estimated: 49 mL/min — ABNORMAL LOW (ref >60)
Glucose, Bld: 103 mg/dL — ABNORMAL HIGH (ref 70–99)
Potassium: 3.9 mmol/L (ref 3.5–5.1)
Sodium: 140 mmol/L (ref 135–145)

## 2020-12-23 LAB — CBC
HCT: 37.2 % (ref 36.0–46.0)
Hemoglobin: 11.9 g/dL — ABNORMAL LOW (ref 12.0–15.0)
MCH: 30.1 pg (ref 26.0–34.0)
MCHC: 32 g/dL (ref 30.0–36.0)
MCV: 94.2 fL (ref 80.0–100.0)
Platelets: 284 10*3/uL (ref 150–400)
RBC: 3.95 MIL/uL (ref 3.87–5.11)
RDW: 13.5 % (ref 11.5–15.5)
WBC: 8.6 10*3/uL (ref 4.0–10.5)
nRBC: 0 % (ref 0.0–0.2)

## 2020-12-23 LAB — PROTIME-INR
INR: 1.5 — ABNORMAL HIGH (ref 0.8–1.2)
Prothrombin Time: 17.4 seconds — ABNORMAL HIGH (ref 11.4–15.2)

## 2020-12-23 LAB — HEPARIN LEVEL (UNFRACTIONATED): Heparin Unfractionated: 0.52 IU/mL (ref 0.30–0.70)

## 2020-12-23 LAB — SARS CORONAVIRUS 2 (TAT 6-24 HRS): SARS Coronavirus 2: NEGATIVE

## 2020-12-23 LAB — MAGNESIUM: Magnesium: 2.1 mg/dL (ref 1.7–2.4)

## 2020-12-23 MED ORDER — WARFARIN SODIUM 7.5 MG PO TABS
7.5000 mg | ORAL_TABLET | Freq: Once | ORAL | Status: AC
  Administered 2020-12-23: 17:00:00 7.5 mg via ORAL
  Filled 2020-12-23: qty 1

## 2020-12-23 NOTE — Progress Notes (Signed)
Physical Therapy Treatment Patient Details Name: Mindy Mills MRN: [id number] DOB: 1951/03/23 Today's Date: 12/23/2020    History of Present Illness Pt adm to [facility] on 12/07/20 with acute systolic heart failure, aspiration PNA, apical thrombus, and acute respiratory failure with hypoxia. On 12/17 developed sudden onset of aphasia, altered mental status, and rt sided weakness. Code stroke called and pt found to have acute left MCA infarct. Transferred to [facility] on 12/18. PMH - SAH s/p coiling 2015, copd, chf, htn, migraine.    PT Comments    Pt progressing towards her physical therapy goals. Requiring up to min assist for transfers, ambulating 120 feet with a walker at a min guard assist level. Continues with impaired communication, balance deficits, right sided weakness and inattention, and decreased endurance. Pt unable to accurately respond "yes," or "no," this session; required cues for use of communication board. Continue to recommend SNF for ongoing Physical Therapy.       Follow Up Recommendations  SNF     Equipment Recommendations  Rolling walker with 5" wheels    Recommendations for Other Services       Precautions / Restrictions Precautions Precautions: Fall Restrictions Weight Bearing Restrictions: No    Mobility  Bed Mobility Overal bed mobility: Needs Assistance Bed Mobility: Supine to Sit     Supine to sit: Supervision     General bed mobility comments: Cues for initiation, no physical assist required  Transfers Overall transfer level: Needs assistance Equipment used: Rolling walker (2 wheeled) Transfers: Sit to/from Stand Sit to Stand: Min assist;Min guard         General transfer comment: Min guard to stand from edge of bed, minA to boost from low toilet height  Ambulation/Gait Ambulation/Gait assistance: Min guard;+2 safety/equipment Gait Distance (Feet): 120 Feet Assistive device: Rolling walker (2 wheeled) Gait Pattern/deviations: Step-through  pattern;Decreased step length - right;Decreased dorsiflexion - right;Narrow base of support Gait velocity: decreased Gait velocity interpretation: <1.31 ft/sec, indicative of household ambulator General Gait Details: Min guard for safety (+2 equipment), difficulty with dual tasking, having to stop when performing head turns and locating objects in environment with max cueing   Stairs             Wheelchair Mobility    Modified Rankin (Stroke Patients Only) Modified Rankin (Stroke Patients Only) Pre-Morbid Rankin Score: No symptoms Modified Rankin: Moderately severe disability     Balance Overall balance assessment: Needs assistance Sitting-balance support: No upper extremity supported;Feet supported Sitting balance-Leahy Scale: Good     Standing balance support: Bilateral upper extremity supported;During functional activity Standing balance-Leahy Scale: Poor Standing balance comment: UE supported required for mobility tasks                            Cognition Arousal/Alertness: Awake/alert Behavior During Therapy: Agitated Overall Cognitive Status: Difficult to assess                                 General Comments: Pt following ~75% of 1 step commands, requires repetition at times. Verbalizing the words "go, go go," but otherwise no intelligible speech. Have to cue to utilize communication board effectively. Not accurately responding with "yes," or "no," this date.      Exercises      General Comments        Pertinent Vitals/Pain Pain Assessment: Faces Faces Pain Scale: No hurt  Home Living                      Prior Function            PT Goals (current goals can now be found in the care plan section) Acute Rehab PT Goals Patient Stated Goal: Pt unable to state due to aphasia Time For Goal Achievement: 01/06/21 Potential to Achieve Goals: Good Progress towards PT goals: Progressing toward goals     Frequency    Min 3X/week      PT Plan Current plan remains appropriate    Co-evaluation PT/OT/SLP Co-Evaluation/Treatment: Yes Reason for Co-Treatment: For patient/therapist safety;To address functional/ADL transfers;Necessary to address cognition/behavior during functional activity PT goals addressed during session: Mobility/safety with mobility        AM-PAC PT "6 Clicks" Mobility   Outcome Measure  Help needed turning from your back to your side while in a flat bed without using bedrails?: None Help needed moving from lying on your back to sitting on the side of a flat bed without using bedrails?: A Little Help needed moving to and from a bed to a chair (including a wheelchair)?: A Little Help needed standing up from a chair using your arms (e.g., wheelchair or bedside chair)?: A Little Help needed to walk in hospital room?: A Little Help needed climbing 3-5 steps with a railing? : A Lot 6 Click Score: 18    End of Session Equipment Utilized During Treatment: Gait belt;Oxygen Activity Tolerance: Patient tolerated treatment well Patient left: in chair;with call bell/phone within reach;with chair alarm set Nurse Communication: Mobility status PT Visit Diagnosis: Other abnormalities of gait and mobility (R26.89);Hemiplegia and hemiparesis Hemiplegia - Right/Left: Right Hemiplegia - dominant/non-dominant: Dominant Hemiplegia - caused by: Cerebral infarction     Time: [id number] PT Time Calculation (min) (ACUTE ONLY): 31 min  Charges:  $Therapeutic Activity: 8-22 mins                     Wyona Almas, PT, DPT Acute Rehabilitation Services Pager [phone] Office [phone]    Deno Etienne 12/23/2020, 1:15 PM

## 2020-12-23 NOTE — TOC Progression Note (Signed)
Transition of Care [facility]) - Progression Note    Patient Details  Name: AAYRA HORNBAKER MRN: [id number] Date of Birth: Mar 31, 1951  Transition of Care [facility]) CM/SW Contact  Carley Hammed, [place] Phone Number: 12/23/2020, 2:18 PM  Clinical Narrative:    CSW received bed choice from pt's family member. She chose [facility] and facility was notified. Facility stated beds are first come first serve, so need to update on day of DC. Insurance auth started and will request updated Covid test. SW will continue to follow for DC planning.   Expected Discharge Plan: Home w Home Health Services Barriers to Discharge: Continued Medical Work up  Expected Discharge Plan and Services Expected Discharge Plan: Home w Home Health Services In-house Referral: Clinical Social Work     Living arrangements for the past 2 months: Single Family Home                                       Social Determinants of Health (SDOH) Interventions    Readmission Risk Interventions No flowsheet data found.

## 2020-12-23 NOTE — Progress Notes (Signed)
PROGRESS NOTE    Mindy Mills  [id number] DOB: [birth year] DOA: 12/07/2020 PCP: Patient, No Pcp Per   Brief Narrative:  69 y.o.femalewith medical history significant ofmigraines, previous intracerebral hemorrhage in 2016 which resulted in prolonged course including tracheostomy and PEG tube. Patient has since had PEG tube and tracheostomy removed. She reports that for the past 3 days, she has progressive shortness of breath and cough. She denies any fever. She denies any vomiting. She has not felt as though she is been choking after eating or drinking. She has not had any chest pain. She feels increasingly weak. No dysuria, diarrhea. Since she had progressive shortness of breath, she called EMS for further evaluation  ED Course:Upon arrival to the emergency room, patient is noted to have a mildly elevated creatinine. Oxygen has been applied since she was noted to be hypoxic. Oxygen saturations were 88% on room air. D-dimer was noted to be elevated and CT of the chest was performed that indicated evidence of aspiration pneumonia. COVID-19 test was found to be negative. Troponin mildly elevated around 300 and EKG showed T wave inversions in the anterolateral leads. She was referred for admission.  AM 12/13/20--patient was noted to be dysphasic, not responding to questions nor following commands by RN, and had right hemiparesis. Code Stroke activated at 1100.Work up revealed new acute L-MCA territory stroke, likely cardioembolic. Teleneuro was activated and neurology was consulted. Heparin drip was continued per stroke protocol dosing. This is being transitioned to coumadin. Pharmacy is managing this.  The patient was [facility] to [facility] for continued stroke care on 12/14/2020. Neurology was consulted. EEG was performed and was negative for evidence of seizures.  Assessment & Plan:  Acute Cardioembolic Stroke:  -In the setting of LV thrombus.  -CT brain was suspicious  for an acute left MCA infarct involving the insula and possibly the operculum.  -On 12/17 MR brain demonstrated an acute moderate size left MCA territory infarct. Discontiguous small acute infarct of the left caudate. Third small acute infarct of the right cerebellum. -On 12/17 CTA H&N--Left M2 superior division occlusion;Associated left MCA territory infarct without evidence of a significant penumbra on CTP.Patent carotid and vertebral arteries.  -Patient started on IV heparin which is transitioned to Coumadin as per pharmacy -Neurology signed off -PT/OT has recommended SNF-await bed placement  Acute respiratory failure with hypoxia: Secondary to aspiration pneumoniaand CHFin setting of COPD. The patient is currently saturating 94% on 2-3L by nasal cannula.  -12/1 CTA chest--no PE; extensive fluid and debris RLL bronchus.  -Finished Unasyn for 7 days.  Discontinued Unasyn on 12/23. -Evaluated by speech therapy recommended dysphagia 1 diet.  Acute combined systolic and diastolic CHF: As of echocardiogram performed on 12/09/2020 the patient's EF is 25-30% with Grade 2 diastolic dysfunction and an apical thrombus. There is also trivial MR. 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  There are plans for [facility] as outpatient. -Strict INO's and daily weight.  Monitor signs for fluid overload.  COPD: No wheezing noted on exam.  The patient is continuing on duoneb, pulmicort, and brovana. She has incentive spirometry. She has a 50 pack her smoking history  Elevated troponin: The patient is without chest pain. She does have T wave inversions in the precordial leads. Echocardiogram shows EF of 25-30%. This is possibly due to demand ischemia in the setting of pneumonia or stress induced CM as she has a history of Takotsubo's CM. -plans for [facility] as outpatient when the patient has  completely recovered from this stroke.   Apical thrombus: Source of cardioembonic CVA. Continue Coumadin as per  pharmacy.  History of SAH: Noted. 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 She has been seen by neurology and felt reasonable to start on dual antiplatelet therapy versus anticoagulation as needed.  AKI on CKDIIIa: -Likely secondary to decreased p.o. intake.  Monitor kidney function closely.  This could be her new baseline.  GFR: 49, creatinine: 1.20. -Repeat BMP tomorrow a.m.  Generalized weakness: The patient has been evaluated by physical therapy with recommendations for SNF.   Hypokalemia: Resolved  Severe Protein Calorie Malnutrition: Nutrition consulted. Continued supplements.   Disposition: Patient is improving overall.  Her kidney function slightly declined.  Await SNF bed availability/placement.  DVT prophylaxis: Coumadin Code Status: DNR Family Communication:  None present at bedside.  Plan of care discussed with patient in length and he verbalized understanding and agreed with it.  10/26: I called patient's daughter-in-law Baxter Hire (she is a Engineer, civil (consulting) and works here at [employer] is not a power of attorney however she is the only close relative who makes decision for the patient.  Patient clearly cannot make decisions for herself.  I discussed with her about patient's current status and a need of SNF placement and she agreed and wishes to proceed.  Disposition Plan: SNF-await bed placement  Consultants:   Neurology  Cardiology  Procedures:   CT head  CTA head and neck  MRI brain  CT cerebral perfusion study  Antimicrobials:   Unasyn  Status is: Inpatient   Dispo: The patient is from: Home              Anticipated d/c is to: SNF              Anticipated d/c date is: 2 days              Patient currently is not medically stable to d/c.    Subjective: Patient seen and examined this morning.  Sleepy but arousable and following commands.  Remained afebrile.  No acute events overnight.  Denies any new  complaints.  Objective: Vitals:   12/22/20 2213 12/23/20 0703 12/23/20 0721 12/23/20 0723  BP:  101/74    Pulse:  76 75   Resp:  18 16   Temp:  97.8 F (36.6 C)    TempSrc:  Oral    SpO2: 98% 97% 97% 97%  Weight:  42.2 kg    Height:        Intake/Output Summary (Last 24 hours) at 12/23/2020 1431 Last data filed at 12/23/2020 1043 Gross per 24 hour  Intake 348.45 ml  Output --  Net 348.45 ml   Filed Weights   12/20/20 0656 12/22/20 0516 12/23/20 0703  Weight: 42.3 kg 43.2 kg 42.2 kg    Examination:  General exam: Appears calm and comfortable, thin and lean, on 2 L of oxygen via nasal cannula, sleepy but arousable and following commands. Respiratory system: Clear to auscultation. Respiratory effort normal. Cardiovascular system: S1 & S2 heard, RRR. No JVD, murmurs, rubs, gallops or clicks. No pedal edema. Gastrointestinal system: Abdomen is nondistended, soft and nontender. No organomegaly or masses felt. Normal bowel sounds heard. Central nervous system: Sleepy but arousable and following commands.  Has expressive aphasia.  Moving all extremities equally Skin: No rashes, lesions or ulcers   Data Reviewed: I have personally reviewed following labs and imaging studies  CBC: Recent Labs  Lab 12/19/20  [id number] 12/20/20 0302 12/21/20 0506 12/22/20 0515 12/23/20 0244  WBC 8.5 8.8 9.7 7.7 8.6  NEUTROABS 5.4  --   --   --   --   HGB 11.5* 12.1 12.2 11.5* 11.9*  HCT 35.9* 37.4 36.3 36.0 37.2  MCV 93.7 93.3 91.2 94.2 94.2  PLT 277 315 313 285 [id number]   Basic Metabolic Panel: Recent Labs  Lab 12/18/20 0224 12/19/20 0315 12/20/20 0302 12/21/20 0506 12/22/20 0515 12/23/20 0244  NA 141 140 141 139 139 140  K 3.2* 3.4* 3.8 4.2 4.4 3.9  CL 104 103 103 102 104 102  CO2 26 28 28 26 25 26   GLUCOSE 88 103* 98 105* 92 103*  BUN 12 11 10 13 12 16   CREATININE 0.97 1.03* 1.10* 1.22* 1.17* 1.20*  CALCIUM 9.0 8.6* 9.0 9.5 9.1 9.4  MG 1.7  --  2.0  --   --  2.1   GFR: Estimated  Creatinine Clearance: 29.9 mL/min (A) (by C-G formula based on SCr of 1.2 mg/dL (H)). Liver Function Tests: No results for input(s): AST, ALT, ALKPHOS, BILITOT, PROT, ALBUMIN in the last 168 hours. No results for input(s): LIPASE, AMYLASE in the last 168 hours. No results for input(s): AMMONIA in the last 168 hours. Coagulation Profile: Recent Labs  Lab 12/19/20 0315 12/20/20 0302 12/21/20 0506 12/22/20 0515 12/23/20 0244  INR 1.1 1.2 1.4* 1.5* 1.5*   Cardiac Enzymes: No results for input(s): CKTOTAL, CKMB, CKMBINDEX, TROPONINI in the last 168 hours. BNP (last 3 results) No results for input(s): PROBNP in the last 8760 hours. HbA1C: No results for input(s): HGBA1C in the last 72 hours. CBG: No results for input(s): GLUCAP in the last 168 hours. Lipid Profile: No results for input(s): CHOL, HDL, LDLCALC, TRIG, CHOLHDL, LDLDIRECT in the last 72 hours. Thyroid Function Tests: No results for input(s): TSH, T4TOTAL, FREET4, T3FREE, THYROIDAB in the last 72 hours. Anemia Panel: No results for input(s): VITAMINB12, FOLATE, FERRITIN, TIBC, IRON, RETICCTPCT in the last 72 hours. Sepsis Labs: No results for input(s): PROCALCITON, LATICACIDVEN in the last 168 hours.  Recent Results (from the past 240 hour(s))  Culture, Urine     Status: None   Collection Time: 12/13/20  6:25 PM   Specimen: Urine, Catheterized  Result Value Ref Range Status   Specimen Description   Final    URINE, CATHETERIZED Performed at [facility], [address], [place], [place] [address]    Special Requests   Final    NONE Performed at [facility], [address] [place], [place] [address]    Culture   Final    NO GROWTH Performed at [facility], [place] [address], [place], [place] [address]    Report Status 12/15/2020 FINAL  Final  Gastrointestinal Panel by PCR , Stool     Status: None   Collection Time: 12/17/20 12:47 PM   Specimen: Stool  Result Value Ref Range Status   Campylobacter species  NOT DETECTED NOT DETECTED Final   Plesimonas shigelloides NOT DETECTED NOT DETECTED Final   Salmonella species NOT DETECTED NOT DETECTED Final   Yersinia enterocolitica NOT DETECTED NOT DETECTED Final   Vibrio species NOT DETECTED NOT DETECTED Final   Vibrio cholerae NOT DETECTED NOT DETECTED Final   Enteroaggregative E coli (EAEC) NOT DETECTED NOT DETECTED Final   Enteropathogenic E coli (EPEC) NOT DETECTED NOT DETECTED Final   Enterotoxigenic E coli (ETEC) NOT DETECTED NOT DETECTED Final   Shiga like toxin producing E coli (STEC)  NOT DETECTED NOT DETECTED Final   Shigella/Enteroinvasive E coli (EIEC) NOT DETECTED NOT DETECTED Final   Cryptosporidium NOT DETECTED NOT DETECTED Final   Cyclospora cayetanensis NOT DETECTED NOT DETECTED Final   Entamoeba histolytica NOT DETECTED NOT DETECTED Final   Giardia lamblia NOT DETECTED NOT DETECTED Final   Adenovirus F40/41 NOT DETECTED NOT DETECTED Final   Astrovirus NOT DETECTED NOT DETECTED Final   Norovirus GI/GII NOT DETECTED NOT DETECTED Final   Rotavirus A NOT DETECTED NOT DETECTED Final   Sapovirus (I, II, IV, and V) NOT DETECTED NOT DETECTED Final    Comment: Performed at [facility], [address], [place], [place] [address]      Radiology Studies: No results found.  Scheduled Meds: . arformoterol  15 mcg Nebulization BID  . budesonide (PULMICORT) nebulizer solution  0.5 mg Nebulization BID  . coumadin book   Does not apply Once  . feeding supplement  237 mL Oral BID BM  . FLUoxetine  80 mg Oral Daily  . furosemide  40 mg Oral Daily  . guaiFENesin  600 mg Oral BID  . metoprolol succinate  12.5 mg Oral Daily  . rosuvastatin  40 mg Oral Daily  . warfarin  7.5 mg Oral ONCE-1600  . Warfarin - Pharmacist Dosing Inpatient   Does not apply q1600   Continuous Infusions: . heparin 900 Units/hr (12/23/20 0145)     LOS: 16 days   Time spent: 35 minutes   Ryan Ogborn Loann Quill, MD [facility]  If 7PM-7AM, please  contact night-coverage [email or web address] 12/23/2020, 2:31 PM

## 2020-12-23 NOTE — Progress Notes (Signed)
ANTICOAGULATION CONSULT NOTE - Follow Up Consult  Pharmacy Consult for Heparin>warfarin Indication: cardiac thrombus and stroke  Allergies  Allergen Reactions  . Bee Venom Anaphylaxis and Hives  . Capsicum (Cayenne) [Cayenne] Swelling    Patient Measurements: Height: 5\' 3"  (160 cm) Weight: 42.2 kg (93 lb 0.6 oz) IBW/kg (Calculated) : 52.4 Heparin Dosing Weight: 47.7 kg  Vital Signs: Temp: 97.8 F (36.6 C) (12/27 0703) Temp Source: Oral (12/27 0703) BP: 101/74 (12/27 0703) Pulse Rate: 75 (12/27 0721)  Labs: Recent Labs    12/21/20 0506 12/22/20 0515 12/23/20 0244  HGB 12.2 11.5* 11.9*  HCT 36.3 36.0 37.2  PLT 313 285 284  LABPROT 16.6* 17.8* 17.4*  INR 1.4* 1.5* 1.5*  HEPARINUNFRC 0.36 0.39 0.52  CREATININE 1.22* 1.17* 1.20*    Estimated Creatinine Clearance: 29.9 mL/min (A) (by C-G formula based on SCr of 1.2 mg/dL (H)).  Assessment: 69 yr old female started on IV heparin on 12/10/20 for apical thrombus. Prior hx SAH, cleared for anticoagulation by Neuro. No plans for cath this admit. New CVA on 12/18, now using low therapeutic goal for heparin dosing.  Heparin level remains therapeutic with gtt at 900 units/hr. INR subtherapeutic at 1.5. No bleeding noted.   Goal of Therapy:  INR goal 2-3 Heparin level 0.3-0.5 units/ml Monitor platelets by anticoagulation protocol: Yes   Plan:  Continue heparin 900 units/hr Daily heparin level, CBC Warfarin 7.5 mg x 1 tonight Daily INR  1/19, Reece Leader, [facility] Clinical Pharmacist  12/23/2020 1:34 PM   [facility] pharmacy phone numbers are listed on [email or web address]

## 2020-12-23 NOTE — Progress Notes (Signed)
Occupational Therapy Treatment Patient Details Name: JEANNETE WESOLOWSKI MRN: [id number] DOB: September 02, 1951 Today's Date: 12/23/2020    History of present illness Pt adm to [facility] on 12/07/20 with acute systolic heart failure, aspiration PNA, apical thrombus, and acute respiratory failure with hypoxia. On 12/17 developed sudden onset of aphasia, altered mental status, and rt sided weakness. Code stroke called and pt found to have acute left MCA infarct. Transferred to [facility] on 12/18. PMH - SAH s/p coiling 2015, copd, chf, htn, migraine.   OT comments  Pt making gradual progress towards OT goals. Pt often responding with "go go go" to all questions and requiring max cues/redirection to use communication board to respond yes/no (inconsistently nods head yes/no) and often appearing frustrated during session completion. Pt overall requiring minA for mobility tasks using RW and up to maxA for toileting ADL, having had episode of incontinence initially upon arrival to room. Pt with continued R inattention/visual deficits requiring assist for RW navigation and max cues to locate items to her R visual field. Continue to recommend post acute rehab services at time of discharge. Acute OT to follow.    Follow Up Recommendations  SNF;Supervision/Assistance - 24 hour    Equipment Recommendations  3 in 1 bedside commode;Other (comment) (TBD in next venue)          Precautions / Restrictions Precautions Precautions: Fall Restrictions Weight Bearing Restrictions: No       Mobility Bed Mobility Overal bed mobility: Needs Assistance Bed Mobility: Supine to Sit     Supine to sit: Supervision     General bed mobility comments: Cues for initiation, no physical assist required  Transfers Overall transfer level: Needs assistance Equipment used: Rolling walker (2 wheeled) Transfers: Sit to/from Stand Sit to Stand: Min assist;Min guard         General transfer comment: Min guard to stand from edge of  bed, minA to boost from low toilet height    Balance Overall balance assessment: Needs assistance Sitting-balance support: No upper extremity supported;Feet supported Sitting balance-Leahy Scale: Good     Standing balance support: Bilateral upper extremity supported;During functional activity Standing balance-Leahy Scale: Poor Standing balance comment: UE supported required for mobility tasks                           ADL either performed or assessed with clinical judgement   ADL Overall ADL's : Needs assistance/impaired     Grooming: Wash/dry hands;Min guard;Sitting                   Toilet Transfer: Minimal assistance;+2 for safety/equipment;Ambulation;Regular Toilet;RW   Toileting- Clothing Manipulation and Hygiene: Maximal assistance;+2 for safety/equipment;Sit to/from stand Toileting - Clothing Manipulation Details (indicate cue type and reason): pt with episode of incontinence start of session (bowel and bladder); mobilized to bathroom for further toileting needs start of session     Functional mobility during ADLs: Minimal assistance;+2 for safety/equipment;Rolling walker       Vision   Additional Comments: pt requires max multimodal cues to turn head/track towards R visual field especially far R visual field. noted eyes tend to maintain fixed at midline, unable to track fully towards R visual field   Perception     Praxis      Cognition Arousal/Alertness: Awake/alert Behavior During Therapy: Agitated Overall Cognitive Status: Difficult to assess  General Comments: Pt following ~75% of 1 step commands, requires repetition at times. Verbalizing the words "go, go go," but otherwise no intelligible speech. Have to cue to utilize communication board effectively. Not accurately responding with "yes," or "no," this date.        Exercises     Shoulder Instructions       General Comments VSS on 3L     Pertinent Vitals/ Pain       Pain Assessment: Faces Faces Pain Scale: No hurt Pain Intervention(s): Monitored during session  Home Living                                          Prior Functioning/Environment              Frequency  Min 2X/week        Progress Toward Goals  OT Goals(current goals can now be found in the care plan section)  Progress towards OT goals: Progressing toward goals  Acute Rehab OT Goals Patient Stated Goal: Pt unable to state due to aphasia OT Goal Formulation: Patient unable to participate in goal setting Time For Goal Achievement: 12/31/20 Potential to Achieve Goals: Good ADL Goals Pt Will Perform Grooming: with set-up;standing Pt Will Perform Upper Body Dressing: with modified independence;sitting Pt Will Perform Lower Body Dressing: sit to/from stand;with supervision Pt Will Transfer to Toilet: with supervision;ambulating Pt Will Perform Toileting - Clothing Manipulation and hygiene: with modified independence;sit to/from stand Pt/caregiver will Perform Home Exercise Program: Right Upper extremity;Independently;With written HEP provided  Plan Discharge plan remains appropriate;Frequency remains appropriate    Co-evaluation    PT/OT/SLP Co-Evaluation/Treatment: Yes Reason for Co-Treatment: Necessary to address cognition/behavior during functional activity;For patient/therapist safety;To address functional/ADL transfers (to progress overall participation in therapy session) PT goals addressed during session: Mobility/safety with mobility OT goals addressed during session: ADL's and self-care      AM-PAC OT "6 Clicks" Daily Activity     Outcome Measure   Help from another person eating meals?: A Little Help from another person taking care of personal grooming?: A Little Help from another person toileting, which includes using toliet, bedpan, or urinal?: A Little Help from another person bathing (including  washing, rinsing, drying)?: A Little Help from another person to put on and taking off regular upper body clothing?: A Little Help from another person to put on and taking off regular lower body clothing?: A Lot 6 Click Score: 17    End of Session Equipment Utilized During Treatment: Gait belt;Rolling walker;Oxygen  OT Visit Diagnosis: Unsteadiness on feet (R26.81);Other abnormalities of gait and mobility (R26.89);Muscle weakness (generalized) (M62.81);Other symptoms and signs involving cognitive function;Other symptoms and signs involving the nervous system (R29.898);Hemiplegia and hemiparesis Hemiplegia - Right/Left: Right Hemiplegia - dominant/non-dominant: Dominant Hemiplegia - caused by: Cerebral infarction   Activity Tolerance Patient tolerated treatment well   Patient Left in chair;with call bell/phone within reach;with chair alarm set   Nurse Communication Mobility status        Time: 9518-8416 OT Time Calculation (min): 30 min  Charges: OT General Charges $OT Visit: 1 Visit OT Treatments $Self Care/Home Management : 8-22 mins  Marcy Siren, OT Acute Rehabilitation Services Pager [phone] Office [phone]    Orlando Penner 12/23/2020, 3:01 PM

## 2020-12-23 NOTE — Care Management Important Message (Signed)
Important Message  Patient Details  Name: Mindy Mills MRN: [id number] Date of Birth: 17-Nov-1951   Medicare Important Message Given:  Yes     Renie Ora 12/23/2020, 10:44 AM

## 2020-12-24 DIAGNOSIS — I639 Cerebral infarction, unspecified: Secondary | ICD-10-CM | POA: Diagnosis not present

## 2020-12-24 DIAGNOSIS — I10 Essential (primary) hypertension: Secondary | ICD-10-CM | POA: Diagnosis not present

## 2020-12-24 DIAGNOSIS — J9601 Acute respiratory failure with hypoxia: Secondary | ICD-10-CM | POA: Diagnosis not present

## 2020-12-24 DIAGNOSIS — F32A Depression, unspecified: Secondary | ICD-10-CM | POA: Diagnosis not present

## 2020-12-24 DIAGNOSIS — G4700 Insomnia, unspecified: Secondary | ICD-10-CM | POA: Diagnosis not present

## 2020-12-24 DIAGNOSIS — I63512 Cerebral infarction due to unspecified occlusion or stenosis of left middle cerebral artery: Secondary | ICD-10-CM | POA: Diagnosis not present

## 2020-12-24 DIAGNOSIS — E43 Unspecified severe protein-calorie malnutrition: Secondary | ICD-10-CM | POA: Diagnosis not present

## 2020-12-24 DIAGNOSIS — R4182 Altered mental status, unspecified: Secondary | ICD-10-CM | POA: Diagnosis not present

## 2020-12-24 DIAGNOSIS — J439 Emphysema, unspecified: Secondary | ICD-10-CM | POA: Diagnosis not present

## 2020-12-24 DIAGNOSIS — R2689 Other abnormalities of gait and mobility: Secondary | ICD-10-CM | POA: Diagnosis not present

## 2020-12-24 DIAGNOSIS — R6889 Other general symptoms and signs: Secondary | ICD-10-CM | POA: Diagnosis not present

## 2020-12-24 DIAGNOSIS — I69320 Aphasia following cerebral infarction: Secondary | ICD-10-CM | POA: Diagnosis not present

## 2020-12-24 DIAGNOSIS — I69321 Dysphasia following cerebral infarction: Secondary | ICD-10-CM | POA: Diagnosis not present

## 2020-12-24 DIAGNOSIS — G43909 Migraine, unspecified, not intractable, without status migrainosus: Secondary | ICD-10-CM | POA: Diagnosis not present

## 2020-12-24 DIAGNOSIS — R2681 Unsteadiness on feet: Secondary | ICD-10-CM | POA: Diagnosis not present

## 2020-12-24 DIAGNOSIS — I5021 Acute systolic (congestive) heart failure: Secondary | ICD-10-CM | POA: Diagnosis not present

## 2020-12-24 DIAGNOSIS — J690 Pneumonitis due to inhalation of food and vomit: Secondary | ICD-10-CM | POA: Diagnosis not present

## 2020-12-24 DIAGNOSIS — M6281 Muscle weakness (generalized): Secondary | ICD-10-CM | POA: Diagnosis not present

## 2020-12-24 DIAGNOSIS — I63412 Cerebral infarction due to embolism of left middle cerebral artery: Secondary | ICD-10-CM | POA: Diagnosis not present

## 2020-12-24 DIAGNOSIS — M2550 Pain in unspecified joint: Secondary | ICD-10-CM | POA: Diagnosis not present

## 2020-12-24 DIAGNOSIS — Z7401 Bed confinement status: Secondary | ICD-10-CM | POA: Diagnosis not present

## 2020-12-24 DIAGNOSIS — Z743 Need for continuous supervision: Secondary | ICD-10-CM | POA: Diagnosis not present

## 2020-12-24 DIAGNOSIS — Z23 Encounter for immunization: Secondary | ICD-10-CM | POA: Diagnosis not present

## 2020-12-24 DIAGNOSIS — J80 Acute respiratory distress syndrome: Secondary | ICD-10-CM | POA: Diagnosis not present

## 2020-12-24 LAB — CBC
HCT: 37.7 % (ref 36.0–46.0)
Hemoglobin: 12 g/dL (ref 12.0–15.0)
MCH: 29.9 pg (ref 26.0–34.0)
MCHC: 31.8 g/dL (ref 30.0–36.0)
MCV: 93.8 fL (ref 80.0–100.0)
Platelets: 298 10*3/uL (ref 150–400)
RBC: 4.02 MIL/uL (ref 3.87–5.11)
RDW: 13.3 % (ref 11.5–15.5)
WBC: 8.2 10*3/uL (ref 4.0–10.5)
nRBC: 0 % (ref 0.0–0.2)

## 2020-12-24 LAB — BASIC METABOLIC PANEL
Anion gap: 11 (ref 5–15)
BUN: 23 mg/dL (ref 8–23)
CO2: 30 mmol/L (ref 22–32)
Calcium: 9.6 mg/dL (ref 8.9–10.3)
Chloride: 100 mmol/L (ref 98–111)
Creatinine, Ser: 1.37 mg/dL — ABNORMAL HIGH (ref 0.44–1.00)
GFR, Estimated: 42 mL/min — ABNORMAL LOW (ref >60)
Glucose, Bld: 100 mg/dL — ABNORMAL HIGH (ref 70–99)
Potassium: 4.1 mmol/L (ref 3.5–5.1)
Sodium: 141 mmol/L (ref 135–145)

## 2020-12-24 LAB — PROTIME-INR
INR: 1.8 — ABNORMAL HIGH (ref 0.8–1.2)
Prothrombin Time: 20 seconds — ABNORMAL HIGH (ref 11.4–15.2)

## 2020-12-24 LAB — HEPARIN LEVEL (UNFRACTIONATED): Heparin Unfractionated: 0.54 IU/mL (ref 0.30–0.70)

## 2020-12-24 MED ORDER — ALBUTEROL SULFATE (2.5 MG/3ML) 0.083% IN NEBU: 2.5000 mg | INHALATION_SOLUTION | RESPIRATORY_TRACT | 12 refills | Status: AC | PRN

## 2020-12-24 MED ORDER — GUAIFENESIN ER 600 MG PO TB12: 600.0000 mg | ORAL_TABLET | Freq: Two times a day (BID) | ORAL | 0 refills | Status: AC

## 2020-12-24 MED ORDER — WARFARIN SODIUM 7.5 MG PO TABS: 7.5000 mg | ORAL_TABLET | Freq: Once | ORAL | Status: DC

## 2020-12-24 MED ORDER — BUDESONIDE 0.5 MG/2ML IN SUSP: 0.5000 mg | Freq: Two times a day (BID) | RESPIRATORY_TRACT | 0 refills | Status: AC

## 2020-12-24 MED ORDER — WARFARIN SODIUM 7.5 MG PO TABS
7.5000 mg | ORAL_TABLET | Freq: Once | ORAL | Status: AC
  Administered 2020-12-24: 12:00:00 7.5 mg via ORAL
  Filled 2020-12-24: qty 1

## 2020-12-24 MED ORDER — ARFORMOTEROL TARTRATE 15 MCG/2ML IN NEBU: 15.0000 ug | INHALATION_SOLUTION | Freq: Two times a day (BID) | RESPIRATORY_TRACT | 0 refills | Status: AC

## 2020-12-24 MED ORDER — SODIUM CHLORIDE 0.9 % IV SOLN: Freq: Once | INTRAVENOUS | Status: AC

## 2020-12-24 MED ORDER — WARFARIN SODIUM 5 MG PO TABS: 5.0000 mg | ORAL_TABLET | Freq: Every day | ORAL | 2 refills | Status: DC

## 2020-12-24 MED ORDER — METOPROLOL SUCCINATE ER 25 MG PO TB24
12.5000 mg | ORAL_TABLET | Freq: Every day | ORAL | 0 refills | Status: AC
Start: 2020-12-25 — End: ?

## 2020-12-24 NOTE — TOC Transition Note (Signed)
Transition of Care [facility]) - CM/SW Discharge Note   Patient Details  Name: Mindy Mills MRN: [id number] Date of Birth: 1951-04-25  Transition of Care [facility]) CM/SW Contact:  Carley Hammed, LCSWA Phone Number: 12/24/2020, 12:11 PM   Clinical Narrative:    Nurse to call report to [phone].    Final next level of care: Skilled Nursing Facility Barriers to Discharge: Transportation   Patient Goals and CMS Choice        Discharge Placement              Patient chooses bed at:  ([facility]) Patient to be transferred to facility by: [facility] Name of family member notified: [name] Patient and family notified of of transfer: 12/24/20  Discharge Plan and Services In-house Referral: Clinical Social Work                                   Social Determinants of Health (SDOH) Interventions     Readmission Risk Interventions No flowsheet data found.

## 2020-12-24 NOTE — Discharge Summary (Addendum)
Physician Discharge Summary  Mindy Mills [id number] DOB: 1951-08-13 DOA: 12/07/2020  PCP: Patient, No Pcp Per  Admit date: 12/07/2020 Discharge date: 12/24/2020  Admitted From: Home  Disposition:  SNF  Recommendations for Outpatient Follow-up:  1. Follow-up with PCP in 1 week 2. Repeat BMP in 3 days 3. Check PT/INR on 12/30 4. Take Coumadin 5 mg daily 5. Encourage increased p.o. intake 6. Follow-up with cardiology outpatient 7. Follow-up with neurology outpatient 8. May need repeat echo in 3 to 4 months  Home Health: None Equipment/Devices: None Discharge Condition: Stable CODE STATUS: DNR Diet recommendation: Dysphagia 1 diet  Brief/Interim Summary: 69 y.o.femalewith medical history significant ofmigraines, previous intracerebral hemorrhage in 2016 which resulted in prolonged course including tracheostomy and PEG tube. Patient has since had PEG tube and tracheostomy removed. She reports that for the past 3 days, she has progressive shortness of breath and cough. She denies any fever. She denies any vomiting. She has not felt as though she is been choking after eating or drinking. She has not had any chest pain. She feels increasingly weak. No dysuria, diarrhea. Since she had progressive shortness of breath, she called EMS for further evaluation  ED Course:Upon arrival to the emergency room, patient is noted to have a mildly elevated creatinine. Oxygen has been applied since she was noted to be hypoxic. Oxygen saturations were 88% on room air. D-dimer was noted to be elevated and CT of the chest was performed that indicated evidence of aspiration pneumonia. COVID-19 test was found to be negative. Troponin mildly elevated around 300 and EKG showed T wave inversions in the anterolateral leads. She was referred for admission.  AM 12/13/20--patient was noted to be dysphasic, not responding to questions nor following commands by RN, and had right hemiparesis.  Code Stroke activated at 1100.Work up revealed new acute L-MCA territory stroke, likely cardioembolic. Teleneuro was activated and neurology was consulted. Heparin drip was continued per stroke protocol dosing.  Acute Cardioembolic Stroke:  -In the setting of LV thrombus.  -CT brain was suspicious for an acute left MCA infarct involving the insula and possibly the operculum.  -On 12/17 MR braindemonstrated anacute moderate size left MCA territory infarct. Discontiguous small acute infarct of the left caudate. Third small acute infarct of the right cerebellum. -On 12/17 CTA H&N--Left M2 superior division occlusion;Associated left MCA territory infarct without evidence of a significant penumbra on CTP.Patent carotid and vertebral arteries.  -Patient started on IV heparin which was transitioned to Coumadin as per pharmacy. -Neurology signed off-recommended outpatient follow-up in stroke clinic in 6 to 8 weeks and may repeat echo in 3 to 4 months -Discussed with pharmacy-INR: 1.8.  Reduce heparin to 850 units/h until discharged.  Warfarin 7.5 mg p.o. once prior to discharge.  Patient will be discharged on Coumadin 5 mg daily.  Repeat PT/INR on 12/26/2020.    Acute respiratory failure with hypoxia: Secondary to aspiration pneumoniaand [facility] setting of COPD.  Maintaining oxygen saturation on 2 L of oxygen via nasal cannula. -12/1 CTA chest--no PE; extensive fluid and debris RLL bronchus.  -Finished Unasyn for 7 days.  Discontinued Unasyn on 12/23. -Evaluated by speech therapy recommended dysphagia 1 diet.  Acute combined systolic and diastolic CHF: As of echocardiogram performed on 12/09/2020 the patient's EF is 25-30% with Grade 2 diastolic dysfunction and an apical thrombus. There is also trivial MR. 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   -Strict INO's and daily weight.  Monitor signs for fluid  overload. -Continued Lasix 40 mg daily -Reviewed cardiology note from  12/19 recommend patient is not a candidate for cath now can be reconsidered over several weeks pending recovery from stroke. -Follow-up cardiology outpatient  COPD: No wheezing noted on exam.    Continued on duoneb, pulmicort, and brovana.  -She has a 50 pack her smoking history  Elevated troponin:  No ACS symptoms.  She does have T wave inversions in the precordial leads. Echocardiogram shows EF of 25-30%. This is possibly due to demand ischemia in the setting of pneumonia or stress induced CM as she has a history of Takotsubo's CM. -plans for [facility] outpatient when the patient has completely recovered from this stroke.  Apical thrombus: Source of cardioembonic CVA. Discharged on Coumadin 5 mg daily.  History of SAH: Noted. 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  AKI on CKDIIIa: -Likely secondary to decreased p.o. intake and Lasix.  -Kidney function slightly declined.  Was started on gentle hydration. -Repeat BMP in 2 to 3 days.  Encouraged increased p.o. intake.  Generalized weakness: The patient has been evaluated by physical therapy with recommendations for SNF.   Hypokalemia: Resolved  Severe Protein Calorie Malnutrition: Nutrition consulted. Continued supplements.   Disposition: Patient discharged in a stable condition to SNF.  I called patient's daughter-in-law Cyril Mourning who is also Therapist, sports and discuss about the discharge plan and she agreed and wants to proceed with it.  Discharge Diagnoses:  Acute cardioembolic stroke Acute respiratory failure with hypoxia Acute combined systolic and diastolic CHF COPD Elevated troponin Apical thrombus History of SAH AKI on CKD stage IIIa Generalized weakness Hypokalemia Severe protein calorie malnutrition   Discharge Instructions  Discharge Instructions    Discharge instructions   Complete by: As directed    Follow-up with PCP in 1 week Repeat BMP in 3 days Check PT/INR on 12/30 Take  Coumadin 5 mg daily Encourage increased p.o. intake Follow-up with cardiology outpatient Follow-up with neurology outpatient May need repeat echo in 4 to 6 months   Increase activity slowly   Complete by: As directed      Allergies as of 12/24/2020      Reactions   Bee Venom Anaphylaxis, Hives   Capsicum (cayenne) [cayenne] Swelling      Medication List    TAKE these medications   albuterol (2.5 MG/3ML) 0.083% nebulizer solution Commonly known as: PROVENTIL Take 3 mLs (2.5 mg total) by nebulization every 4 (four) hours as needed for wheezing or shortness of breath.   arformoterol 15 MCG/2ML Nebu Commonly known as: BROVANA Take 2 mLs (15 mcg total) by nebulization 2 (two) times daily.   aspirin 325 MG EC tablet Take 325 mg by mouth daily.   budesonide 0.5 MG/2ML nebulizer solution Commonly known as: PULMICORT Take 2 mLs (0.5 mg total) by nebulization 2 (two) times daily.   cholecalciferol 1000 units tablet Commonly known as: VITAMIN D Take 1,000 Units by mouth daily.   clonazePAM 0.5 MG tablet Commonly known as: KLONOPIN Take 1 tablet (0.5 mg total) by mouth 2 (two) times daily.   docusate sodium 100 MG capsule Commonly known as: COLACE Take 1 capsule (100 mg total) by mouth 2 (two) times daily.   EPINEPHrine 0.3 mg/0.3 mL Soaj injection Commonly known as: EPI-PEN Inject 0.3 mg into the muscle once as needed (for allergic reaction).   feeding supplement Liqd Take 237 mLs by mouth daily.   FLUoxetine 20 MG capsule Commonly known as: PROZAC  Take 80 mg by mouth daily.   guaiFENesin 600 MG 12 hr tablet Commonly known as: MUCINEX Take 1 tablet (600 mg total) by mouth 2 (two) times daily.   methocarbamol 500 MG tablet Commonly known as: ROBAXIN Take 1 tablet (500 mg total) by mouth every 6 (six) hours as needed for muscle spasms.   metoprolol succinate 25 MG 24 hr tablet Commonly known as: TOPROL-XL Take 0.5 tablets (12.5 mg total) by mouth daily. Start  taking on: December 25, 2020   oxyCODONE-acetaminophen 5-325 MG tablet Commonly known as: Percocet Take 1-2 tablets by mouth every 6 (six) hours as needed.   pantoprazole 40 MG tablet Commonly known as: PROTONIX Take 1 tablet (40 mg total) by mouth at bedtime.   rosuvastatin 40 MG tablet Commonly known as: CRESTOR Take 20 mg by mouth daily.   traMADol 50 MG tablet Commonly known as: ULTRAM Take 100 mg by mouth 4 (four) times daily as needed for moderate pain.   traZODone 100 MG tablet Commonly known as: DESYREL Take 200 mg by mouth at bedtime.   warfarin 5 MG tablet Commonly known as: COUMADIN Take 1 tablet (5 mg total) by mouth daily at 4 PM. Start taking on: December 25, 2020       Contact information for after-discharge care    Destination    [facility] .   Service: Skilled Nursing Contact information: [place] [address] [phone]                 Allergies  Allergen Reactions  . Bee Venom Anaphylaxis and Hives  . Capsicum Gretta Arab) [Cayenne] Swelling    Consultations:  Cardiology  Neurology   Procedures/Studies: CT Code Stroke CTA Head W/WO contrast  Result Date: 12/13/2020 CLINICAL DATA:  Code stroke.  Aphasia. EXAM: CT ANGIOGRAPHY HEAD AND NECK CT PERFUSION BRAIN TECHNIQUE: Multidetector CT imaging of the head and neck was performed using the standard protocol during bolus administration of intravenous contrast. Multiplanar CT image reconstructions and MIPs were obtained to evaluate the vascular anatomy. Carotid stenosis measurements (when applicable) are obtained utilizing NASCET criteria, using the distal internal carotid diameter as the denominator. Multiphase CT imaging of the brain was performed following IV bolus contrast injection. Subsequent parametric perfusion maps were calculated using RAPID software. CONTRAST:  136mL OMNIPAQUE IOHEXOL 350 MG/ML SOLN COMPARISON:  Head MRA  09/09/2015 and CTA 09/19/2014 FINDINGS: CTA NECK FINDINGS Aortic arch: Common origin of the brachiocephalic and left common carotid arteries. Moderate atherosclerotic plaque in the aortic arch without evidence of a significant arch vessel origin stenosis. Right carotid system: Patent with mild calcified and soft plaque about the carotid bifurcation and in the mid common carotid artery. No evidence of significant stenosis or dissection. Left carotid system: Patent with mild calcified and soft plaque about the carotid bifurcation and in the mid common carotid artery without associated significant stenosis. Tortuous proximal to mid cervical ICA with a loop and focally kinked appearance versus a small web. Vertebral arteries: The vertebral arteries are patent with scattered atherosclerotic plaque throughout their cervical segments bilaterally. No significant stenosis or dissection is identified. Codominant. Skeleton: Mild-to-moderate cervical disc degeneration. Other neck: Subcentimeter right thyroid nodules, considered clinically insignificant and for which no follow-up imaging is recommended. Upper chest: Moderate centrilobular emphysema. Partially visualized small pleural effusions. Review of the MIP images confirms the above findings CTA HEAD FINDINGS Anterior circulation: The internal carotid arteries are patent from skull base to carotid termini with mild atherosclerotic  plaque bilaterally not resulting in a significant stenosis. The left M1 segment is widely patent, however there is occlusion of the left M2 superior division near its origin. The left M2 inferior division is patent. The right MCA and both ACAs are patent without evidence of a significant proximal stenosis. 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 Posterior circulation: The intracranial vertebral arteries are widely patent to the basilar.  Patent PICA and SCA origins are seen bilaterally. The basilar artery is widely patent. Posterior communicating arteries are diminutive or absent. Both PCAs are patent without evidence of a significant proximal stenosis. No aneurysm is identified. Venous sinuses: Patent. Anatomic variants: None. Review of the MIP images confirms the above findings CT Brain Perfusion Findings: ASPECTS: 8 CBF (<30%) Volume: 44 mL Perfusion (Tmax>6.0s) volume: 43 mL Mismatch Volume: None Infarction Location: Left MCA territory centered in the opercular region IMPRESSION: 1. Left M2 superior division occlusion. 2. Associated left MCA territory infarct without evidence of a significant penumbra on CTP. 3. Patent carotid and vertebral arteries. Loop in the proximal left ICA with focal kinking versus a small web. 4. Previous anterior communicating aneurysm coiling. 5. Aortic Atherosclerosis (ICD10-I70.0) and Emphysema (ICD10-J43.9). Emergent finding of left M2 occlusion was discussed by telephone on 12/13/2020 at 12:45 p.m. with Dr. Amie Portland , who verbally acknowledged these results. Electronically Signed   By: Logan Bores M.D.   On: 12/13/2020 13:14   CT HEAD WO CONTRAST  Result Date: 12/14/2020 CLINICAL DATA:  Follow-up stroke. EXAM: CT HEAD WITHOUT CONTRAST TECHNIQUE: Contiguous axial images were obtained from the base of the skull through the vertex without intravenous contrast. COMPARISON:  December 13, 2020 FINDINGS: Brain: No subdural, epidural, or subarachnoid hemorrhage. The known left frontal infarct is again identified measuring up to 5.5 cm in cranial caudal dimension, not significantly changed in the interval. No gross hemorrhagic transformation or bleed. Possible early petechial blood not excluded. No midline shift. Moderate to severe white matter changes are seen. No other sites of infarct are identified. Ventricles and sulci are stable. Cerebellum, brainstem, and basal cisterns are stable. An aneurysm clip is seen  in the suprasellar cistern,. Vascular: Calcified atherosclerosis in the intracranial carotids. Aneurysm clip in the suprasellar cistern. Skull: Normal. Negative for fracture or focal lesion. Sinuses/Orbits: No acute finding. Other: None. IMPRESSION: 1. The known left frontal infarct is again identified. The overall size is similar in the interval. No gross hemorrhagic transformation. Possible early petechial blood not excluded. 2. No other acute abnormalities. Electronically Signed   By: Dorise Bullion III M.D   On: 12/14/2020 14:12   CT HEAD WO CONTRAST  Result Date: 12/13/2020 CLINICAL DATA:  Follow-up code stroke.  Right hemiparesis. EXAM: CT HEAD WITHOUT CONTRAST TECHNIQUE: Contiguous axial images were obtained from the base of the skull through the vertex without intravenous contrast. COMPARISON:  CT and MRI studies earlier same day FINDINGS: Brain: No abnormality is seen affecting the brainstem or cerebellum. There is now loss of gray-white differentiation affecting the left frontal operculum consistent with the region of acute infarction shown by MRI. Overall, this measures approximately 5-6 cm in size. No gross hemorrhagic transformation. There may be some minor petechial hyperdensity along the anterior aspect of the region of infarction. No mass effect or shift. Chronic small-vessel ischemic changes of the white matter as seen previously. Old right frontal cortical and subcortical infarction. Previous coiled aneurysm in the midline at the  base of the brain. No hydrocephalus. No extra-axial collection. Vascular: No acute vascular finding by CT. Skull: Negative Sinuses/Orbits: Clear/normal Other: None IMPRESSION: 1. Loss of gray-white differentiation affecting the left frontal operculum consistent with the region of acute infarction shown by MRI. Overall, this measures approximately 5-6 cm in size. No gross hemorrhagic transformation. Possible early petechial blood hyperdensity in the anterior portion  of the infarction. No mass effect or shift. 2. 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 3. Chronic small-vessel ischemic changes of the white matter. Old right frontal cortical and subcortical infarction. Electronically Signed   By: Nelson Chimes M.D.   On: 12/13/2020 20:06   CT Code Stroke CTA Neck W/WO contrast  Result Date: 12/13/2020 CLINICAL DATA:  Code stroke.  Aphasia. EXAM: CT ANGIOGRAPHY HEAD AND NECK CT PERFUSION BRAIN TECHNIQUE: Multidetector CT imaging of the head and neck was performed using the standard protocol during bolus administration of intravenous contrast. Multiplanar CT image reconstructions and MIPs were obtained to evaluate the vascular anatomy. Carotid stenosis measurements (when applicable) are obtained utilizing NASCET criteria, using the distal internal carotid diameter as the denominator. Multiphase CT imaging of the brain was performed following IV bolus contrast injection. Subsequent parametric perfusion maps were calculated using RAPID software. CONTRAST:  191mL OMNIPAQUE IOHEXOL 350 MG/ML SOLN COMPARISON:  Head MRA 09/09/2015 and CTA 09/19/2014 FINDINGS: CTA NECK FINDINGS Aortic arch: Common origin of the brachiocephalic and left common carotid arteries. Moderate atherosclerotic plaque in the aortic arch without evidence of a significant arch vessel origin stenosis. Right carotid system: Patent with mild calcified and soft plaque about the carotid bifurcation and in the mid common carotid artery. No evidence of significant stenosis or dissection. Left carotid system: Patent with mild calcified and soft plaque about the carotid bifurcation and in the mid common carotid artery without associated significant stenosis. Tortuous proximal to mid cervical ICA with a loop and focally kinked appearance versus a small web. Vertebral arteries: The vertebral arteries are patent with scattered atherosclerotic plaque throughout their cervical segments bilaterally. No  significant stenosis or dissection is identified. Codominant. Skeleton: Mild-to-moderate cervical disc degeneration. Other neck: Subcentimeter right thyroid nodules, considered clinically insignificant and for which no follow-up imaging is recommended. Upper chest: Moderate centrilobular emphysema. Partially visualized small pleural effusions. Review of the MIP images confirms the above findings CTA HEAD FINDINGS Anterior circulation: The internal carotid arteries are patent from skull base to carotid termini with mild atherosclerotic plaque bilaterally not resulting in a significant stenosis. The left M1 segment is widely patent, however there is occlusion of the left M2 superior division near its origin. The left M2 inferior division is patent. The right MCA and both ACAs are patent without evidence of a significant proximal stenosis. 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 Posterior circulation: The intracranial vertebral arteries are widely patent to the basilar. Patent PICA and SCA origins are seen bilaterally. The basilar artery is widely patent. Posterior communicating arteries are diminutive or absent. Both PCAs are patent without evidence of a significant proximal stenosis. No aneurysm is identified. Venous sinuses: Patent. Anatomic variants: None. Review of the MIP images confirms the above findings CT Brain Perfusion Findings: ASPECTS: 8 CBF (<30%) Volume: 44 mL Perfusion (Tmax>6.0s) volume: 43 mL Mismatch Volume: None Infarction Location: Left MCA territory centered in the opercular region IMPRESSION: 1. Left M2 superior division occlusion. 2. Associated left MCA territory infarct  without evidence of a significant penumbra on CTP. 3. Patent carotid and vertebral arteries. Loop in the proximal left ICA with focal kinking versus a small web. 4. Previous anterior communicating aneurysm coiling. 5.  Aortic Atherosclerosis (ICD10-I70.0) and Emphysema (ICD10-J43.9). Emergent finding of left M2 occlusion was discussed by telephone on 12/13/2020 at 12:45 p.m. with Dr. Amie Portland , who verbally acknowledged these results. Electronically Signed   By: Logan Bores M.D.   On: 12/13/2020 13:14   CT Angio Chest PE W/Cm &/Or Wo Cm  Result Date: 12/07/2020 CLINICAL DATA:  Chest pain for several days and elevated D-dimer. EXAM: CT ANGIOGRAPHY CHEST WITH CONTRAST TECHNIQUE: Multidetector CT imaging of the chest was performed using the standard protocol during bolus administration of intravenous contrast. Multiplanar CT image reconstructions and MIPs were obtained to evaluate the vascular anatomy. CONTRAST:  12mL OMNIPAQUE IOHEXOL 350 MG/ML SOLN COMPARISON:  None. FINDINGS: Cardiovascular: The heart is borderline enlarged for age. No pericardial effusion. Significant age advanced atherosclerotic calcifications involving the thoracic aorta and branch vessels. Three-vessel coronary artery calcifications are noted. The pulmonary arterial tree is well opacified. No filling defects to suggest pulmonary embolism. Mediastinum/Nodes: Borderline right hilar lymph nodes likely reactive/inflammatory. No mediastinal adenopathy. There is a large hiatal hernia. Lungs/Pleura: Severe emphysematous changes and pulmonary scarring. Extensive fluid and debris filling the right lower lobe bronchus and its branches highly suggestive acute aspiration. There is also mild peribronchial thickening and peribronchial inflammatory changes. Right lower lobe atelectasis is noted adjacent to the hiatal hernia. No pleural effusions. No worrisome pulmonary lesions. Upper Abdomen: Chronic appearing left-sided hydronephrosis with fairly significant renal cortical thinning. Aortic and branch vessel calcifications are noted. Low-attenuation left adrenal gland lesion, likely benign adenoma. Musculoskeletal: No breast masses, supraclavicular or axillary  adenopathy. The bony thorax is intact. No worrisome bone lesions or acute fractures. Review of the MIP images confirms the above findings. IMPRESSION: 1. No CT findings for pulmonary embolism. 2. Significant age advanced atherosclerotic calcifications involving the thoracic aorta and branch vessels including the coronary arteries. 3. Severe emphysematous changes and pulmonary scarring. 4. Extensive fluid and debris filling the right lower lobe bronchus and its branches highly suggestive of acute aspiration. There is also mild peribronchial thickening and peribronchial inflammatory changes. 5. Large hiatal hernia. 6. Chronic appearing left-sided hydronephrosis with fairly significant renal cortical thinning. 7. Emphysema and aortic atherosclerosis. Aortic Atherosclerosis (ICD10-I70.0) and Emphysema (ICD10-J43.9). Electronically Signed   By: Marijo Sanes M.D.   On: 12/07/2020 12:33   MR BRAIN WO CONTRAST  Result Date: 12/13/2020 CLINICAL DATA:  Right hemiparesis EXAM: MRI HEAD WITHOUT CONTRAST TECHNIQUE: Multiplanar, multiecho pulse sequences of the brain and surrounding structures were obtained without intravenous contrast. COMPARISON:  2016 FINDINGS: DWI, sagittal T1, and axial T2 sequences were obtained. Motion artifact is present. Brain: There is restricted diffusion in the left MCA territory involving the frontal lobe and anterior insula. There is involvement of the lateral precentral gyrus as well as the operculum. Additional small area of acute infarction along the body of the caudate on the left. There is a third small area of acute infarction in the right cerebellum. Small right parietal chronic infarct. Small chronic infarct of the left corona radiata. Additional areas of T2 hyperintensity in the supratentorial white matter probably reflect chronic microvascular ischemic changes. There is no intracranial mass or mass effect.  No hydrocephalus. Vascular: Major vessel flow voids at the skull base are  preserved. Skull and upper cervical spine: Normal marrow signal is preserved. Sinuses/Orbits: Paranasal  sinuses are aerated. Orbits are unremarkable. Other: Sella is unremarkable. Mild patchy mastoid fluid opacification. 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 Discontiguous small acute infarct of the left caudate. Third small acute infarct of the right cerebellum. Electronically Signed   By: Guadlupe Spanish M.D.   On: 12/13/2020 13:27   CT Code Stroke Cerebral Perfusion with contrast  Result Date: 12/13/2020 CLINICAL DATA:  Code stroke.  Aphasia. EXAM: CT ANGIOGRAPHY HEAD AND NECK CT PERFUSION BRAIN TECHNIQUE: Multidetector CT imaging of the head and neck was performed using the standard protocol during bolus administration of intravenous contrast. Multiplanar CT image reconstructions and MIPs were obtained to evaluate the vascular anatomy. Carotid stenosis measurements (when applicable) are obtained utilizing NASCET criteria, using the distal internal carotid diameter as the denominator. Multiphase CT imaging of the brain was performed following IV bolus contrast injection. Subsequent parametric perfusion maps were calculated using RAPID software. CONTRAST:  OMNIPAQUE IOHEXOL 350 MG/ML SOLN COMPARISON:  Head MRA 09/09/2015 and CTA 09/19/2014 FINDINGS: CTA NECK FINDINGS Aortic arch: Common origin of the brachiocephalic and left common carotid arteries. Moderate atherosclerotic plaque in the aortic arch without evidence of a significant arch vessel origin stenosis. Right carotid system: Patent with mild calcified and soft plaque about the carotid bifurcation and in the mid common carotid artery. No evidence of significant stenosis or dissection. Left carotid system: Patent with mild calcified and soft plaque about the carotid bifurcation and in the mid common carotid artery without associated significant stenosis. Tortuous proximal to mid cervical ICA with a loop and focally kinked  appearance versus a small web. Vertebral arteries: The vertebral arteries are patent with scattered atherosclerotic plaque throughout their cervical segments bilaterally. No significant stenosis or dissection is identified. Codominant. Skeleton: Mild-to-moderate cervical disc degeneration. Other neck: Subcentimeter right thyroid nodules, considered clinically insignificant and for which no follow-up imaging is recommended. Upper chest: Moderate centrilobular emphysema. Partially visualized small pleural effusions. Review of the MIP images confirms the above findings CTA HEAD FINDINGS Anterior circulation: The internal carotid arteries are patent from skull base to carotid termini with mild atherosclerotic plaque bilaterally not resulting in a significant stenosis. The left M1 segment is widely patent, however there is occlusion of the left M2 superior division near its origin. The left M2 inferior division is patent. The right MCA and both ACAs are patent without evidence of a significant proximal stenosis. 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 Posterior circulation: The intracranial vertebral arteries are widely patent to the basilar. Patent PICA and SCA origins are seen bilaterally. The basilar artery is widely patent. Posterior communicating arteries are diminutive or absent. Both PCAs are patent without evidence of a significant proximal stenosis. No aneurysm is identified. Venous sinuses: Patent. Anatomic variants: None. Review of the MIP images confirms the above findings CT Brain Perfusion Findings: ASPECTS: 8 CBF (<30%) Volume: 44 mL Perfusion (Tmax>6.0s) volume: 43 mL Mismatch Volume: None Infarction Location: Left MCA territory centered in the opercular region IMPRESSION: 1. Left M2 superior division occlusion. 2. Associated left MCA territory infarct without evidence of a significant penumbra  on CTP. 3. Patent carotid and vertebral arteries. Loop in the proximal left ICA with focal kinking versus a small web. 4. Previous anterior communicating aneurysm coiling. 5. Aortic Atherosclerosis (ICD10-I70.0) and Emphysema (ICD10-J43.9). Emergent finding of left M2 occlusion was discussed by telephone on 12/13/2020 at 12:45 p.m. with Dr. Milon Dikes , who verbally  acknowledged these results. Electronically Signed   By: Logan Bores M.D.   On: 12/13/2020 13:14   DG Chest Port 1 View  Result Date: 12/07/2020 CLINICAL DATA:  cough EXAM: PORTABLE CHEST 1 VIEW COMPARISON:  10/10/2020 and prior. FINDINGS: No focal consolidation. No pneumothorax or pleural effusion. Stable cardiomediastinal silhouette including moderate hiatal hernia. Aortic atherosclerotic calcifications. No acute osseous abnormality. IMPRESSION: No focal airspace disease. Electronically Signed   By: Primitivo Gauze M.D.   On: 12/07/2020 10:12   DG Swallowing Func-Speech Pathology  Result Date: 12/16/2020 Objective Swallowing Evaluation: Type of Study: MBS-Modified Barium Swallow Study  Patient Details Name: ALENAH LAPLUME MRN: [id number] Date of Birth: 1951/01/28 Today's Date: 12/16/2020 Time: SLP Start Time (ACUTE ONLY): 0818 -SLP Stop Time (ACUTE ONLY): 0833 SLP Time Calculation (min) (ACUTE ONLY): 15 min Past Medical History: Past Medical History: Diagnosis Date . Cancer ([place])   Cervicle . Full dentures  . Hepatitis C   denies . Hypertension  . Migraine  . Myocardial infarction ([place])  . Stroke [facility]) 2015  Pt reports hx of CVA x3.  Last one in 2015 . Wears glasses  . Wears hearing aid in both ears  Past Surgical History: Past Surgical History: Procedure Laterality Date . ABDOMINAL HYSTERECTOMY   . BUNIONECTOMY   . COLONOSCOPY   . MULTIPLE TOOTH EXTRACTIONS   . OPEN REDUCTION INTERNAL FIXATION (ORIF) DISTAL RADIAL FRACTURE Right 09/07/2018  Procedure: OPEN REDUCTION INTERNAL FIXATION (ORIF) DISTAL RADIAL FRACTURE;  Surgeon: Iran Planas, MD;  Location: [facility];  Service: Orthopedics;  Laterality: Right; . PEG PLACEMENT N/A 09/21/2014  Procedure: PERCUTANEOUS ENDOSCOPIC GASTROSTOMY (PEG) PLACEMENT (BED SIDE);  Surgeon: Georganna Skeans, MD;  Location: [facility];  Service: General;  Laterality: N/A; . RADIOLOGY WITH ANESTHESIA N/A 09/11/2014  Procedure: Sub Arachnoid Aneurysm Coiling -RADIOLOGY WITH ANESTHESIA ;  Surgeon: Consuella Lose, MD;  Location: [place];  Service: Radiology;  Laterality: N/A; HPI: KENDALYNN KANAAN is a 69 y.o. female with medical history significant of migraines, intracerebral hemorrhage in 2015. ed. Admitted to [facility] with shortness of breath and cough. Found to have suspected aspiration pneumonia. Chest CT debris filling the right lower lobe bronchus and its branches highly suggestive of acute aspiration; large hiatal hernia. BSE 12/13 rec'd Dys 3/thin. On 12/17 pt noted to have acute aphasia>code stroke. MRI acute moderate left MCA infarct, small acute infarct right cerebellum. Repeat B[facility] at [facility] recommended Dys 1/honey thick and pt transported to [facility].  Subjective: alert, not vocalizing Assessment / Plan / Recommendation CHL IP CLINICAL IMPRESSIONS 12/16/2020 Clinical Impression Pt presents with a moderate oral more than pharyngeal dysphagia, with impaired timing and coordination primarily impacting safety with swallowing. She has slow oral transit with reduced bolus cohesion, premature spillage to the valleculae and pyriform sinuses, and oral residue. She will intermittently perform a second, spontaneous swallow to reduce oral residue. Impaired timing, as well as reduced hyolaryngeal movement and laryngeal closure, result in penetration and aspiration with nectar thick and thin liquids, respectively. Pt had minimal coughing during testing, coughing x1 before any airway invasion had occurred, and again x1 after aspiration but this was ineffective. Recommend continuing Dys 1 diet and honey thick liquids with good potential  for upgrade as oral control improves. SLP Visit Diagnosis Dysphagia, oropharyngeal phase (R13.12) Attention and concentration deficit following -- Frontal lobe and executive function deficit following -- Impact on safety and function Moderate aspiration risk   CHL IP TREATMENT RECOMMENDATION 12/16/2020 Treatment Recommendations Therapy as outlined in treatment plan  below   Prognosis 12/16/2020 Prognosis for Safe Diet Advancement Good Barriers to Reach Goals Severity of deficits;Language deficits Barriers/Prognosis Comment -- CHL IP DIET RECOMMENDATION 12/16/2020 SLP Diet Recommendations Dysphagia 1 (Puree) solids;Honey thick liquids Liquid Administration via Cup Medication Administration Crushed with puree Compensations Small sips/bites;Lingual sweep for clearance of pocketing;Monitor for anterior loss Postural Changes Seated upright at 90 degrees;Remain semi-upright after after feeds/meals (Comment)   CHL IP OTHER RECOMMENDATIONS 12/16/2020 Recommended Consults -- Oral Care Recommendations Oral care before and after PO;Oral care BID Other Recommendations --   CHL IP FOLLOW UP RECOMMENDATIONS 12/16/2020 Follow up Recommendations Inpatient Rehab   CHL IP FREQUENCY AND DURATION 12/16/2020 Speech Therapy Frequency (ACUTE ONLY) min 2x/week Treatment Duration 2 weeks      CHL IP ORAL PHASE 12/16/2020 Oral Phase Impaired Oral - Pudding Teaspoon -- Oral - Pudding Cup -- Oral - Honey Teaspoon -- Oral - Honey Cup Reduced posterior propulsion;Lingual/palatal residue;Delayed oral transit;Decreased bolus cohesion;Premature spillage Oral - Nectar Teaspoon -- Oral - Nectar Cup Reduced posterior propulsion;Lingual/palatal residue;Delayed oral transit;Decreased bolus cohesion;Premature spillage Oral - Nectar Straw -- Oral - Thin Teaspoon -- Oral - Thin Cup Reduced posterior propulsion;Lingual/palatal residue;Delayed oral transit;Decreased bolus cohesion;Premature spillage Oral - Thin Straw Reduced posterior  propulsion;Lingual/palatal residue;Delayed oral transit;Decreased bolus cohesion;Premature spillage Oral - Puree Reduced posterior propulsion;Lingual/palatal residue;Delayed oral transit;Decreased bolus cohesion Oral - Mech Soft -- Oral - Regular -- Oral - Multi-Consistency -- Oral - Pill -- Oral Phase - Comment --  CHL IP PHARYNGEAL PHASE 12/16/2020 Pharyngeal Phase Impaired Pharyngeal- Pudding Teaspoon -- Pharyngeal -- Pharyngeal- Pudding Cup -- Pharyngeal -- Pharyngeal- Honey Teaspoon -- Pharyngeal -- Pharyngeal- Honey Cup Delayed swallow initiation-pyriform sinuses;Reduced anterior laryngeal mobility;Reduced laryngeal elevation;Reduced airway/laryngeal closure;Delayed swallow initiation-vallecula Pharyngeal -- Pharyngeal- Nectar Teaspoon -- Pharyngeal -- Pharyngeal- Nectar Cup Delayed swallow initiation-pyriform sinuses;Reduced anterior laryngeal mobility;Reduced laryngeal elevation;Reduced airway/laryngeal closure;Penetration/Aspiration during swallow;Penetration/Aspiration before swallow Pharyngeal Material enters airway, CONTACTS cords and not ejected out Pharyngeal- Nectar Straw -- Pharyngeal -- Pharyngeal- Thin Teaspoon -- Pharyngeal -- Pharyngeal- Thin Cup Delayed swallow initiation-pyriform sinuses;Reduced anterior laryngeal mobility;Reduced laryngeal elevation;Reduced airway/laryngeal closure;Penetration/Aspiration during swallow;Penetration/Aspiration before swallow Pharyngeal Material enters airway, passes BELOW cords without attempt by patient to eject out (silent aspiration) Pharyngeal- Thin Straw Delayed swallow initiation-pyriform sinuses;Reduced anterior laryngeal mobility;Reduced laryngeal elevation;Reduced airway/laryngeal closure;Penetration/Aspiration before swallow;Penetration/Aspiration during swallow Pharyngeal Material enters airway, passes BELOW cords and not ejected out despite cough attempt by patient Pharyngeal- Puree Delayed swallow initiation-pyriform sinuses;Reduced anterior  laryngeal mobility;Reduced laryngeal elevation;Reduced airway/laryngeal closure Pharyngeal -- Pharyngeal- Mechanical Soft -- Pharyngeal -- Pharyngeal- Regular -- Pharyngeal -- Pharyngeal- Multi-consistency -- Pharyngeal -- Pharyngeal- Pill -- Pharyngeal -- Pharyngeal Comment --  CHL IP CERVICAL ESOPHAGEAL PHASE 12/16/2020 Cervical Esophageal Phase WFL Pudding Teaspoon -- Pudding Cup -- Honey Teaspoon -- Honey Cup -- Nectar Teaspoon -- Nectar Cup -- Nectar Straw -- Thin Teaspoon -- Thin Cup -- Thin Straw -- Puree -- Mechanical Soft -- Regular -- Multi-consistency -- Pill -- Cervical Esophageal Comment -- Osie Bond., M.A. [place] [facility] Pager [phone] Office [phone] 12/16/2020, 10:05 AM              EEG adult  Result Date: 12/14/2020 Lora [facility], MD     12/14/2020  8:29 PM Patient Name: Mindy Mills MRN: [id number] Epilepsy Attending: Lora [facility] Referring Physician/Provider: Dr Shanon Brow Tat Date: 12/14/2020 Duration: 23.39 mins Patient history: 69 y.o. 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 EEG to evaluate for seizure Level of alertness: Awake AEDs during EEG study: None Technical aspects: This EEG study was done with scalp electrodes positioned according to the 10-20 International system of electrode placement. Electrical activity was acquired at a sampling rate of 500Hz  and reviewed with a high frequency filter of 70Hz  and a low frequency filter of 1Hz . EEG data were recorded continuously and digitally stored. Description: The posterior dominant rhythm consists of 9-10 Hz activity of moderate voltage (25-35 uV) seen predominantly in posterior head regions, symmetric and reactive to eye opening and eye closing.  EEG showed continuous 3 to 6 Hz theta-delta slowing in left hemisphere, maximal left frontal region which at times appear rhythmic. Sharp transients were also seen in left frontotemporal region.  Hyperventilation and photic stimulation were not performed.   ABNORMALITY -Continued slow, left hemisphere, maximal left frontal region IMPRESSION: This study is suggestive of cortical dysfunction in left hemisphere, maximal left frontal region secondary to underlying structural abnormality/stroke. No seizures or definite epileptiform discharges were seen throughout the recording. Lora [facility]   ECHOCARDIOGRAM COMPLETE  Result Date: 12/09/2020    ECHOCARDIOGRAM REPORT   Patient Name:   KEIR KUMPF Date of Exam: 12/09/2020 Medical Rec #:  [id number]        Height:       63.0 in Accession #:    [id number]       Weight:       104.0 lb Date of Birth:  09/05/1951         BSA:          1.464 m Patient Age:    69 years         BP:           115/90 mmHg Patient Gender: F                HR:           94 bpm. Exam Location:  [facility] Procedure: 2D Echo Indications:    Elevated Troponin  History:        Patient has prior history of Echocardiogram examinations, most                 recent 09/10/2015. TIA; Risk Factors:Former Smoker.                 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  Sonographer:    Leavy Cella RDCS (AE) Referring Phys: [place]  1. The mid to distal anteroseptal,anterior and anterolateral walls are hypokinetic. The apex is hypokinetic. There is a 0.7 x 1.3 cm mural apical thrombus. . Left ventricular ejection fraction, by estimation, is 25 to 30%. The left ventricle has severely decreased function. The left ventricle demonstrates regional wall motion abnormalities (see scoring diagram/findings for description). There is  moderate left ventricular hypertrophy. Left ventricular diastolic parameters are consistent with Grade II diastolic dysfunction (pseudonormalization). Elevated left atrial pressure.  2. Right ventricular systolic function is normal. The right ventricular size is normal.  3. Left atrial size was mildly dilated.  4. The  mitral valve is normal in structure. Trivial mitral valve regurgitation. No evidence of mitral stenosis. Moderate mitral annular calcification.  5. The aortic valve was not well visualized. There is mild calcification of the aortic valve. There is mild thickening of the aortic valve. Aortic valve regurgitation is not visualized. No aortic stenosis is present.  6. The inferior vena cava is normal in size with greater than 50% respiratory variability, suggesting right atrial pressure of 3 mmHg. FINDINGS  Left Ventricle: The mid to distal anteroseptal,anterior and anterolateral walls are hypokinetic. The apex is hypokinetic. There is a 0.7 x 1.3 cm mural apical thrombus. Left ventricular ejection fraction, by estimation, is 25 to 30%. The left ventricle has severely decreased function. The left ventricle demonstrates regional wall motion abnormalities. The left ventricular internal cavity size was normal in size. There is moderate left ventricular hypertrophy. Left ventricular diastolic parameters are consistent with Grade II diastolic dysfunction (pseudonormalization). Elevated left atrial pressure. Right Ventricle: The right ventricular size is normal. No increase in right ventricular wall thickness. Right ventricular systolic function is normal. Left Atrium: Left atrial size was mildly dilated. Right Atrium: Right atrial size was normal in size. Pericardium: There is no evidence of pericardial effusion. Mitral Valve: The mitral valve is normal in structure. There is mild thickening of the mitral valve leaflet(s). There is mild calcification of the mitral valve leaflet(s). Moderate mitral annular  calcification. Trivial mitral valve regurgitation. No evidence of mitral valve stenosis. Tricuspid Valve: The tricuspid valve is normal in structure. Tricuspid valve regurgitation is mild . No evidence of tricuspid stenosis. Aortic Valve: The aortic valve was not well visualized. There is mild calcification of the aortic valve. There is mild thickening of the aortic valve. There is mild aortic valve annular calcification. Aortic valve regurgitation is not visualized. No aortic stenosis is present. Aortic valve mean gradient measures 2.8 mmHg. Aortic valve peak gradient measures 5.0 mmHg. Aortic valve area, by VTI measures 1.84 cm. Pulmonic Valve: The pulmonic valve was not well visualized. Pulmonic valve regurgitation is not visualized. No evidence of pulmonic stenosis. Aorta: The aortic root is normal in size and structure. Pulmonary Artery: Indeterminant PASP, inadequate TR jet. Venous: The inferior vena cava is normal in size with greater than 50% respiratory variability, suggesting right atrial pressure of 3 mmHg. IAS/Shunts: No atrial level shunt detected by color flow Doppler.  LEFT VENTRICLE PLAX 2D LVIDd:         3.41 cm  Diastology LVIDs:         2.51 cm  LV e' medial:    3.37 cm/s LV PW:         1.28 cm  LV E/e' medial:  20.7 LV IVS:        1.45 cm  LV e' lateral:   4.90 cm/s LVOT diam:     1.90 cm  LV E/e' lateral: 14.2 LV SV:         29 LV SV Index:   20 LVOT Area:     2.84 cm  RIGHT VENTRICLE RV S prime:     7.18 cm/s TAPSE (M-mode): 1.7 cm LEFT ATRIUM             Index       RIGHT ATRIUM           Index LA diam:        3.60 cm 2.46 cm/m  RA Area:     13.90 cm LA Vol (A2C):   52.9 ml 36.12 ml/m RA  Volume:   41.30 ml  28.20 ml/m LA Vol (A4C):   43.9 ml 29.98 ml/m LA Biplane Vol: 48.1 ml 32.84 ml/m  AORTIC VALVE AV Area (Vmax):    1.83 cm AV Area (Vmean):   1.61 cm AV Area (VTI):     1.84 cm AV Vmax:           111.69 cm/s AV Vmean:          80.592 cm/s AV VTI:            0.159 m AV Peak Grad:       5.0 mmHg AV Mean Grad:      2.8 mmHg LVOT Vmax:         71.98 cm/s LVOT Vmean:        45.672 cm/s LVOT VTI:          0.103 m LVOT/AV VTI ratio: 0.65  AORTA Ao Root diam: 2.90 cm MITRAL VALVE               TRICUSPID VALVE MV Area (PHT): 4.63 cm    TR Peak grad:   37.0 mmHg MV Decel Time: 164 msec    TR Vmax:        304.00 cm/s MV E velocity: 69.80 cm/s MV A velocity: 41.10 cm/s  SHUNTS MV E/A ratio:  1.70        Systemic VTI:  0.10 m                            Systemic Diam: 1.90 cm Carlyle Dolly MD Electronically signed by Carlyle Dolly MD Signature Date/Time: 12/09/2020/12:44:45 PM    Final    CT HEAD CODE STROKE WO CONTRAST`  Addendum Date: 12/13/2020   ADDENDUM REPORT: 12/13/2020 12:25 ADDENDUM: Findings were discussed via telephone with Dr. Rory Percy on 12/13/2020 at 12:10 p.m. CTA and CTP are pending. Electronically Signed   By: Logan Bores M.D.   On: 12/13/2020 12:25   Result Date: 12/13/2020 CLINICAL DATA:  Code stroke. Altered level of consciousness. Weakness. EXAM: CT HEAD WITHOUT CONTRAST TECHNIQUE: Contiguous axial images were obtained from the base of the skull through the vertex without intravenous contrast. COMPARISON:  10/10/2020 FINDINGS: Brain: There is new hypodensity involving the left insula and possibly left operculum. There is no evidence of acute intracranial hemorrhage, mass, midline shift, or extra-axial fluid collection. A small chronic right parietal cortical infarct is again noted. Hypodensities in the cerebral white matter bilaterally are unchanged and nonspecific but compatible with moderate chronic small vessel ischemic disease. A chronic lacunar infarct in the posterior limb of the left internal capsule is unchanged. There is mild cerebral atrophy. Vascular: Prior coiling of an anterior communicating aneurysm. Calcified atherosclerosis at the skull base. No hyperdense vessel. Skull: No fracture or suspicious osseous lesion. Sinuses/Orbits: Visualized paranasal sinuses and  mastoid air cells are clear. Leftward gaze. Other: None. ASPECTS [facility] Stroke Program Early CT Score) - Ganglionic level infarction (caudate, lentiform nuclei, internal capsule, insula, M1-M3 cortex): 5-6 - Supraganglionic infarction (M4-M6 cortex): 3 Total score (0-10 with 10 being normal): 8-9 IMPRESSION: 1. Suspected acute left MCA infarct involving the insula and possibly operculum. No hemorrhage. 2. ASPECTS is 8-9. 3. Moderate chronic small vessel ischemic disease. Small chronic right parietal infarct. Attempts to relate these findings to the ordering physician began at 11:38 a.m. and are ongoing. Electronically Signed: By: Logan Bores M.D. On: 12/13/2020 12:01      Subjective: Patient seen  and examined.  Sitting comfortably on the bed.  No acute events overnight.  Remained afebrile.  Has expressive aphasia.  Discharge Exam: Vitals:   12/24/20 0811 12/24/20 0843  BP: 103/62   Pulse:  70  Resp:  14  Temp: 98.9 F (37.2 C)   SpO2: 96% 98%   Vitals:   12/23/20 2040 12/24/20 0315 12/24/20 0811 12/24/20 0843  BP:  (!) 129/54 103/62   Pulse:  66  70  Resp:  18  14  Temp:  97.6 F (36.4 C) 98.9 F (37.2 C)   TempSrc:  Oral Oral   SpO2: 96%  96% 98%  Weight:      Height:        General: Pt is alert, awake, not in acute distress Cardiovascular: RRR, S1/S2 +, no rubs, no gallops Respiratory: CTA bilaterally, no wheezing, no rhonchi Abdominal: Soft, NT, ND, bowel sounds + Extremities: no edema, no cyanosis    The results of significant diagnostics from this hospitalization (including imaging, microbiology, ancillary and laboratory) are listed below for reference.     Microbiology: Recent Results (from the past 240 hour(s))  Gastrointestinal Panel by PCR , Stool     Status: None   Collection Time: 12/17/20 12:47 PM   Specimen: Stool  Result Value Ref Range Status   Campylobacter species NOT DETECTED NOT DETECTED Final   Plesimonas shigelloides NOT DETECTED NOT DETECTED  Final   Salmonella species NOT DETECTED NOT DETECTED Final   Yersinia enterocolitica NOT DETECTED NOT DETECTED Final   Vibrio species NOT DETECTED NOT DETECTED Final   Vibrio cholerae NOT DETECTED NOT DETECTED Final   Enteroaggregative E coli (EAEC) NOT DETECTED NOT DETECTED Final   Enteropathogenic E coli (EPEC) NOT DETECTED NOT DETECTED Final   Enterotoxigenic E coli (ETEC) NOT DETECTED NOT DETECTED Final   Shiga like toxin producing E coli (STEC) NOT DETECTED NOT DETECTED Final   Shigella/Enteroinvasive E coli (EIEC) NOT DETECTED NOT DETECTED Final   Cryptosporidium NOT DETECTED NOT DETECTED Final   Cyclospora cayetanensis NOT DETECTED NOT DETECTED Final   Entamoeba histolytica NOT DETECTED NOT DETECTED Final   Giardia lamblia NOT DETECTED NOT DETECTED Final   Adenovirus F40/41 NOT DETECTED NOT DETECTED Final   Astrovirus NOT DETECTED NOT DETECTED Final   Norovirus GI/GII NOT DETECTED NOT DETECTED Final   Rotavirus A NOT DETECTED NOT DETECTED Final   Sapovirus (I, II, IV, and V) NOT DETECTED NOT DETECTED Final    Comment: Performed at [facility], [address] [place], [place] [address]  SARS CORONAVIRUS 2 (TAT 6-24 HRS) Nasopharyngeal Nasopharyngeal Swab     Status: None   Collection Time: 12/23/20  2:31 PM   Specimen: Nasopharyngeal Swab  Result Value Ref Range Status   SARS Coronavirus 2 NEGATIVE NEGATIVE Final    Comment: (NOTE) SARS-CoV-2 target nucleic acids are NOT DETECTED.  The SARS-CoV-2 RNA is generally detectable in upper and lower respiratory specimens during the acute phase of infection. Negative results do not preclude SARS-CoV-2 infection, do not rule out co-infections with other pathogens, and should not be used as the sole basis for treatment or other patient management decisions. Negative results must be combined with clinical observations, patient history, and epidemiological information. The expected result is Negative.  Fact Sheet for  Patients: SugarRoll.be  Fact Sheet for Healthcare Providers: https://www.woods-mathews.com/  This test is not yet approved or cleared by the [place] FDA and  has been authorized for detection and/or diagnosis of SARS-CoV-2 by FDA  under an Emergency Use Authorization (EUA). This EUA will remain  in effect (meaning this test can be used) for the duration of the COVID-19 declaration under Se ction 564(b)(1) of the Act, 21 U.S.C. section 360bbb-3(b)(1), unless the authorization is terminated or revoked sooner.  Performed at [facility], [place] [address], [place], [place] [address]      Labs: BNP (last 3 results) Recent Labs    12/10/20 0442 12/12/20 0634  BNP 2,426.0* [id number]*   Basic Metabolic Panel: Recent Labs  Lab 12/18/20 0224 12/19/20 0315 12/20/20 0302 12/21/20 0506 12/22/20 0515 12/23/20 0244 12/24/20 0159  NA 141   < > 141 139 139 140 141  K 3.2*   < > 3.8 4.2 4.4 3.9 4.1  CL 104   < > 103 102 104 102 100  CO2 26   < > 28 26 25 26 30   GLUCOSE 88   < > 98 105* 92 103* 100*  BUN 12   < > 10 13 12 16 23   CREATININE 0.97   < > 1.10* 1.22* 1.17* 1.20* 1.37*  CALCIUM 9.0   < > 9.0 9.5 9.1 9.4 9.6  MG 1.7  --  2.0  --   --  2.1  --    < > = values in this interval not displayed.   Liver Function Tests: No results for input(s): AST, ALT, ALKPHOS, BILITOT, PROT, ALBUMIN in the last 168 hours. No results for input(s): LIPASE, AMYLASE in the last 168 hours. No results for input(s): AMMONIA in the last 168 hours. CBC: Recent Labs  Lab 12/19/20 0315 12/20/20 0302 12/21/20 0506 12/22/20 0515 12/23/20 0244 12/24/20 0159  WBC 8.5 8.8 9.7 7.7 8.6 8.2  NEUTROABS 5.4  --   --   --   --   --   HGB 11.5* 12.1 12.2 11.5* 11.9* 12.0  HCT 35.9* 37.4 36.3 36.0 37.2 37.7  MCV 93.7 93.3 91.2 94.2 94.2 93.8  PLT 277 315 313 285 284 298   Cardiac Enzymes: No results for input(s): CKTOTAL, CKMB, CKMBINDEX, TROPONINI in the  last 168 hours. BNP: Invalid input(s): POCBNP CBG: No results for input(s): GLUCAP in the last 168 hours. D-Dimer No results for input(s): DDIMER in the last 72 hours. Hgb A1c No results for input(s): HGBA1C in the last 72 hours. Lipid Profile No results for input(s): CHOL, HDL, LDLCALC, TRIG, CHOLHDL, LDLDIRECT in the last 72 hours. Thyroid function studies No results for input(s): TSH, T4TOTAL, T3FREE, THYROIDAB in the last 72 hours.  Invalid input(s): FREET3 Anemia work up No results for input(s): VITAMINB12, FOLATE, FERRITIN, TIBC, IRON, RETICCTPCT in the last 72 hours. Urinalysis    Component Value Date/Time   COLORURINE YELLOW 12/13/2020 1825   APPEARANCEUR CLEAR 12/13/2020 1825   LABSPEC >1.046 (H) 12/13/2020 1825   PHURINE 6.0 12/13/2020 1825   GLUCOSEU NEGATIVE 12/13/2020 1825   HGBUR NEGATIVE 12/13/2020 1825   BILIRUBINUR NEGATIVE 12/13/2020 1825   KETONESUR 5 (A) 12/13/2020 1825   PROTEINUR NEGATIVE 12/13/2020 1825   UROBILINOGEN 1.0 09/08/2015 1817   NITRITE NEGATIVE 12/13/2020 1825   LEUKOCYTESUR NEGATIVE 12/13/2020 1825   Sepsis Labs Invalid input(s): PROCALCITONIN,  WBC,  LACTICIDVEN Microbiology Recent Results (from the past 240 hour(s))  Gastrointestinal Panel by PCR , Stool     Status: None   Collection Time: 12/17/20 12:47 PM   Specimen: Stool  Result Value Ref Range Status   Campylobacter species NOT DETECTED NOT DETECTED Final   Plesimonas shigelloides NOT  DETECTED NOT DETECTED Final   Salmonella species NOT DETECTED NOT DETECTED Final   Yersinia enterocolitica NOT DETECTED NOT DETECTED Final   Vibrio species NOT DETECTED NOT DETECTED Final   Vibrio cholerae NOT DETECTED NOT DETECTED Final   Enteroaggregative E coli (EAEC) NOT DETECTED NOT DETECTED Final   Enteropathogenic E coli (EPEC) NOT DETECTED NOT DETECTED Final   Enterotoxigenic E coli (ETEC) NOT DETECTED NOT DETECTED Final   Shiga like toxin producing E coli (STEC) NOT DETECTED NOT  DETECTED Final   Shigella/Enteroinvasive E coli (EIEC) NOT DETECTED NOT DETECTED Final   Cryptosporidium NOT DETECTED NOT DETECTED Final   Cyclospora cayetanensis NOT DETECTED NOT DETECTED Final   Entamoeba histolytica NOT DETECTED NOT DETECTED Final   Giardia lamblia NOT DETECTED NOT DETECTED Final   Adenovirus F40/41 NOT DETECTED NOT DETECTED Final   Astrovirus NOT DETECTED NOT DETECTED Final   Norovirus GI/GII NOT DETECTED NOT DETECTED Final   Rotavirus A NOT DETECTED NOT DETECTED Final   Sapovirus (I, II, IV, and V) NOT DETECTED NOT DETECTED Final    Comment: Performed at [facility], [address] [place], [place] [address]  SARS CORONAVIRUS 2 (TAT 6-24 HRS) Nasopharyngeal Nasopharyngeal Swab     Status: None   Collection Time: 12/23/20  2:31 PM   Specimen: Nasopharyngeal Swab  Result Value Ref Range Status   SARS Coronavirus 2 NEGATIVE NEGATIVE Final    Comment: (NOTE) SARS-CoV-2 target nucleic acids are NOT DETECTED.  The SARS-CoV-2 RNA is generally detectable in upper and lower respiratory specimens during the acute phase of infection. Negative results do not preclude SARS-CoV-2 infection, do not rule out co-infections with other pathogens, and should not be used as the sole basis for treatment or other patient management decisions. Negative results must be combined with clinical observations, patient history, and epidemiological information. The expected result is Negative.  Fact Sheet for Patients: SugarRoll.be  Fact Sheet for Healthcare Providers: https://www.woods-mathews.com/  This test is not yet approved or cleared by the [place] FDA and  has been authorized for detection and/or diagnosis of SARS-CoV-2 by FDA under an Emergency Use Authorization (EUA). This EUA will remain  in effect (meaning this test can be used) for the duration of the COVID-19 declaration under Se ction 564(b)(1) of the Act, 21  U.S.C. section 360bbb-3(b)(1), unless the authorization is terminated or revoked sooner.  Performed at [facility], [place] [address], [place], [place] [address]      Time coordinating discharge: Over 30 minutes  SIGNED:   Mckinley Jewel, MD  [facility] 12/24/2020, 11:07 AM Pager   If 7PM-7AM, please contact night-coverage [email or web address]

## 2020-12-24 NOTE — NC FL2 (Signed)
[place] MEDICAID FL2 LEVEL OF CARE SCREENING TOOL     IDENTIFICATION  Patient Name: Mindy Mills Birthdate: 11-Mar-1951 Sex: female Admission Date (Current Location): 12/07/2020  [facility] and [place] Number:  [employer] and Address:  [facility], [address] [place], [place] [address]      Provider Number: [id number]  Attending Physician Name and Address:  Ollen Bowl, MD  Relative Name and Phone Number:  Azell Der [phone]    Current Level of Care: Hospital Recommended Level of Care: Skilled Nursing Facility Prior Approval Number:    Date Approved/Denied:   PASRR Number: [id number]  Discharge Plan: SNF    Current Diagnoses: Patient Active Problem List   Diagnosis Date Noted  . Cardioembolic stroke (HCC) 12/14/2020  . Protein-calorie malnutrition, severe 12/12/2020  . Pneumonia of right lower lobe due to infectious organism   . Acute systolic CHF (congestive heart failure) (HCC) 12/09/2020  . Emphysema/COPD (HCC) 12/09/2020  . Aspiration pneumonia (HCC) 12/07/2020  . Elevated troponin 12/07/2020  . Closed fracture of right distal radius 09/07/2018  . Fall 09/08/2015  . UTI (lower urinary tract infection) 09/08/2015  . Dehydration 09/08/2015  . Sepsis (HCC) 09/08/2015  . Systolic heart failure (HCC) 09/08/2015  . Confusion 09/08/2015  . TIA (transient ischemic attack) 09/08/2015  . Acute on chronic respiratory failure (HCC) 09/24/2014  . Tracheostomy status (HCC) 09/22/2014  . Demand ischemia of myocardium: in setting of CVA 09/17/2014  . 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 Acute respiratory failure (HCC) 09/11/2014  . Hypokalemia 09/11/2014  . Altered mental status 09/11/2014  . Subarachnoid hemorrhage (HCC) 09/11/2014    Orientation RESPIRATION BLADDER  Height & Weight     Self,Time,Place (Disoriented X4)  O2 ([place] 2) Incontinent Weight: 93 lb 0.6 oz (42.2 kg) Height:  5\' 3"  (160 cm)  BEHAVIORAL SYMPTOMS/MOOD NEUROLOGICAL BOWEL NUTRITION STATUS      Continent Diet (see dc summary)  AMBULATORY STATUS COMMUNICATION OF NEEDS Skin   Limited Assist Non-Verbally Normal                       Personal Care Assistance Level of Assistance  Bathing,Feeding,Dressing Bathing Assistance: Limited assistance Feeding assistance: Limited assistance Dressing Assistance: Limited assistance     Functional Limitations Info  Sight,Hearing,Speech Sight Info: Adequate Hearing Info: Adequate Speech Info: Adequate    SPECIAL CARE FACTORS FREQUENCY  OT (By licensed OT),PT (By licensed PT)     PT Frequency: 5X week OT Frequency: 5X week            Contractures Contractures Info: Not present    Additional Factors Info  Code Status,Allergies,Psychotropic Code Status Info: DNR Allergies Info: Bee venom, Capsicum, Cayenne Psychotropic Info: FLUoxetine (PROZAC) capsule 80 mg daily,         Current Medications (12/24/2020):  This is the current hospital active medication list Current Facility-Administered Medications  Medication Dose Route Frequency Provider Last Rate Last Admin  . albuterol (PROVENTIL) (2.5 MG/3ML) 0.083% nebulizer solution 2.5 mg  2.5 mg Nebulization Q4H PRN Tat, David, MD      . arformoterol (BROVANA) nebulizer solution 15 mcg  15 mcg Nebulization BID 12/26/2020, MD   15 mcg at 12/24/20 0841  . budesonide (PULMICORT) nebulizer solution 0.5 mg  0.5 mg Nebulization BID Tat, David, MD   0.5 mg at  12/24/20 0841  . coumadin book   Does not apply Once Earnie Larsson, [facility]      . feeding supplement (ENSURE ENLIVE / ENSURE PLUS) liquid 237 mL  237 mL Oral BID BM Pahwani, Rinka R, MD   237 mL at 12/24/20 0821  . FLUoxetine (PROZAC) capsule 80 mg  80 mg Oral Daily Tat, Onalee Hua, MD   80 mg at 12/24/20 0815  . guaiFENesin (MUCINEX) 12  hr tablet 600 mg  600 mg Oral BID Tat, David, MD   600 mg at 12/24/20 0815  . heparin ADULT infusion 100 units/mL (25000 units/272mL sodium chloride 0.45%)  850 Units/hr Intravenous Continuous Earnie Larsson, RPH 8.5 mL/hr at 12/24/20 0817 850 Units/hr at 12/24/20 0817  . metoprolol succinate (TOPROL-XL) 24 hr tablet 12.5 mg  12.5 mg Oral Daily Tat, David, MD   12.5 mg at 12/24/20 0815  . ondansetron (ZOFRAN) injection 4 mg  4 mg Intravenous Q6H PRN Catarina Hartshorn, MD   4 mg at 12/12/20 1731  . Resource ThickenUp Clear   Oral PRN Swayze, Ava, DO      . rosuvastatin (CRESTOR) tablet 40 mg  40 mg Oral Daily Pahwani, Rinka R, MD   40 mg at 12/24/20 0814  . traMADol (ULTRAM) tablet 50 mg  50 mg Oral Q6H PRN Catarina Hartshorn, MD   50 mg at 12/09/20 1803  . Warfarin - Pharmacist Dosing Inpatient   Does not apply q1600 Earnie Larsson [facility]   Given at 12/17/20 1557     Discharge Medications: Please see discharge summary for a list of discharge medications.  Relevant Imaging Results:  Relevant Lab Results:   Additional Information    Carley Hammed, [place]

## 2020-12-24 NOTE — Progress Notes (Addendum)
ANTICOAGULATION CONSULT NOTE - Follow Up Consult  Pharmacy Consult for Heparin>warfarin Indication: cardiac thrombus and stroke  Allergies  Allergen Reactions  . Bee Venom Anaphylaxis and Hives  . Capsicum (Cayenne) [Cayenne] Swelling    Patient Measurements: Height: 5\' 3"  (160 cm) Weight: 42.2 kg (93 lb 0.6 oz) IBW/kg (Calculated) : 52.4 Heparin Dosing Weight: 47.7 kg  Vital Signs: Temp: 97.6 F (36.4 C) (12/28 0315) Temp Source: Oral (12/28 0315) BP: 129/54 (12/28 0315) Pulse Rate: 66 (12/28 0315)  Labs: Recent Labs    12/22/20 0515 12/23/20 0244 12/24/20 0159  HGB 11.5* 11.9* 12.0  HCT 36.0 37.2 37.7  PLT 285 284 298  LABPROT 17.8* 17.4* 20.0*  INR 1.5* 1.5* 1.8*  HEPARINUNFRC 0.39 0.52 0.54  CREATININE 1.17* 1.20* 1.37*    Estimated Creatinine Clearance: 26.2 mL/min (A) (by C-G formula based on SCr of 1.37 mg/dL (H)).  Assessment: 70 yr old female started on IV heparin on 12/10/20 for apical thrombus. Prior hx SAH, cleared for anticoagulation by Neuro. No plans for cath this admit. New CVA on 12/18, now using low therapeutic goal for heparin dosing.  Heparin level just above desired goal on heparin at 900 units/hr. INR subtherapeutic at 1.8. No bleeding noted.   Received call from attending indicating plans for discharge today to facility. Will leave recommendations below.   Goal of Therapy:  INR goal 2-3 Heparin level 0.3-0.5 units/ml Monitor platelets by anticoagulation protocol: Yes   Plan:  Reduce heparin to 850 units/hr until discharged Warfarin 7.5 mg x 1 today prior to discharge Warfarin 5mg  daily to start 12/29 Check INR in 48-72 hours as able to ensure appropriate warfarin dosing  1/30 PharmD., BCPS Clinical Pharmacist 12/24/2020 7:34 AM  [facility] pharmacy phone numbers are listed on [email or web address]

## 2020-12-26 DIAGNOSIS — J690 Pneumonitis due to inhalation of food and vomit: Secondary | ICD-10-CM | POA: Diagnosis not present

## 2020-12-26 DIAGNOSIS — G4700 Insomnia, unspecified: Secondary | ICD-10-CM | POA: Diagnosis not present

## 2020-12-26 DIAGNOSIS — I63512 Cerebral infarction due to unspecified occlusion or stenosis of left middle cerebral artery: Secondary | ICD-10-CM | POA: Diagnosis not present

## 2020-12-26 DIAGNOSIS — I10 Essential (primary) hypertension: Secondary | ICD-10-CM | POA: Diagnosis not present

## 2020-12-26 DIAGNOSIS — G43909 Migraine, unspecified, not intractable, without status migrainosus: Secondary | ICD-10-CM | POA: Diagnosis not present

## 2020-12-26 DIAGNOSIS — F32A Depression, unspecified: Secondary | ICD-10-CM | POA: Diagnosis not present

## 2020-12-31 DIAGNOSIS — I679 Cerebrovascular disease, unspecified: Secondary | ICD-10-CM | POA: Diagnosis not present

## 2021-01-08 DIAGNOSIS — F419 Anxiety disorder, unspecified: Secondary | ICD-10-CM | POA: Diagnosis not present

## 2021-01-08 DIAGNOSIS — G4700 Insomnia, unspecified: Secondary | ICD-10-CM | POA: Diagnosis not present

## 2021-01-08 DIAGNOSIS — I10 Essential (primary) hypertension: Secondary | ICD-10-CM | POA: Diagnosis not present

## 2021-01-08 DIAGNOSIS — G43909 Migraine, unspecified, not intractable, without status migrainosus: Secondary | ICD-10-CM | POA: Diagnosis not present

## 2021-01-08 DIAGNOSIS — Z5181 Encounter for therapeutic drug level monitoring: Secondary | ICD-10-CM | POA: Diagnosis not present

## 2021-01-08 DIAGNOSIS — I679 Cerebrovascular disease, unspecified: Secondary | ICD-10-CM | POA: Diagnosis not present

## 2021-01-08 DIAGNOSIS — F32A Depression, unspecified: Secondary | ICD-10-CM | POA: Diagnosis not present

## 2021-01-08 DIAGNOSIS — Z7901 Long term (current) use of anticoagulants: Secondary | ICD-10-CM | POA: Diagnosis not present

## 2021-01-09 DIAGNOSIS — J159 Unspecified bacterial pneumonia: Secondary | ICD-10-CM | POA: Diagnosis not present

## 2021-01-15 DIAGNOSIS — R918 Other nonspecific abnormal finding of lung field: Secondary | ICD-10-CM | POA: Diagnosis not present

## 2021-01-23 DIAGNOSIS — R918 Other nonspecific abnormal finding of lung field: Secondary | ICD-10-CM | POA: Diagnosis not present

## 2021-01-23 DIAGNOSIS — B342 Coronavirus infection, unspecified: Secondary | ICD-10-CM | POA: Diagnosis not present

## 2021-01-24 ENCOUNTER — Ambulatory Visit: Payer: Medicare Other | Admitting: Student

## 2021-01-24 NOTE — Progress Notes (Deleted)
Cardiology Office Note    Date:  01/24/2021   ID:  Mindy Mills, DOB [birth year], MRN [id number]  PCP:  Patient, No Pcp Per  Cardiologist: Carlyle Dolly, MD    No chief complaint on file.   History of Present Illness:    Mindy Mills is a 70 y.o. 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  She was admitted to [facility] from 12/11 - 12/24/2020 for evaluation of worsening dyspnea for several days leading to admission. She was diagnosed with aspiration pneumonia and started on treatment. Cardiology was consulted as she was found to have elevated troponin values which peaked at 338. An echocardiogram was obtained which showed a reduced EF of 25 to 30% with anterior septal, anterior and anterolateral wall hypokinesis. She did have a 0.7 x 1.3 cm mural apical thrombus. Given her prior [facility], Neurology was consulted prior to initiation of anticoagulation and were in agreement to starting Heparin. She was initially going to undergo [facility] during admission but was found to have an acute CVA with left MCA infarct and was transferred to [facility]. It was recommended she be continued on Toprol-XL and Losartan for her cardiomyopathy and continue Heparin for LV thrombus and transition to Coumadin once medically stable. Could consider a catheterization once several weeks out from her CVA. She was transition from Heparin to Coumadin once cleared by Neurology following her CVA and was discharged to SNF per PT recommendations.   Past Medical History:  Diagnosis Date  . 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 Full dentures   . Hepatitis C    denies  . Hypertension   . Migraine   . Myocardial infarction ([place])   . Stroke [facility]) 2015   Pt reports hx of CVA x3.  Last one in 2015  . Wears glasses   . Wears hearing aid in both  ears     Past Surgical History:  Procedure Laterality Date  . ABDOMINAL HYSTERECTOMY    . BUNIONECTOMY    . COLONOSCOPY    . MULTIPLE TOOTH EXTRACTIONS    . OPEN REDUCTION INTERNAL FIXATION (ORIF) DISTAL RADIAL FRACTURE Right 09/07/2018   Procedure: OPEN REDUCTION INTERNAL FIXATION (ORIF) DISTAL RADIAL FRACTURE;  Surgeon: Iran Planas, MD;  Location: [place];  Service: Orthopedics;  Laterality: Right;  . PEG PLACEMENT N/A 09/21/2014   Procedure: PERCUTANEOUS ENDOSCOPIC GASTROSTOMY (PEG) PLACEMENT (BED SIDE);  Surgeon: Georganna Skeans, MD;  Location: [place];  Service: General;  Laterality: N/A;  . RADIOLOGY WITH ANESTHESIA N/A 09/11/2014   Procedure: Sub Arachnoid Aneurysm Coiling -RADIOLOGY WITH ANESTHESIA ;  Surgeon: Consuella Lose, MD;  Location: [place];  Service: Radiology;  Laterality: N/A;    Current Medications: Outpatient Medications Prior to Visit  Medication Sig Dispense Refill  . albuterol (PROVENTIL) (2.5 MG/3ML) 0.083% nebulizer solution Take 3 mLs (2.5 mg total) by nebulization every 4 (four) hours as needed for wheezing or shortness of breath. 75 mL 12  . arformoterol (BROVANA) 15 MCG/2ML NEBU Take 2 mLs (15 mcg total) by nebulization 2 (two) times daily. 120 mL 0  . aspirin 325 MG EC tablet Take 325 mg by mouth daily.    . budesonide (PULMICORT) 0.5 MG/2ML nebulizer solution Take 2 mLs (0.5 mg total) by nebulization 2 (two) times daily. 100 mL 0  . cholecalciferol (VITAMIN  D) 1000 units tablet Take 1,000 Units by mouth daily.    . clonazePAM (KLONOPIN) 0.5 MG tablet Take 1 tablet (0.5 mg total) by mouth 2 (two) times daily. (Patient not taking: No sig reported) 20 tablet 0  . docusate sodium (COLACE) 100 MG capsule Take 1 capsule (100 mg total) by mouth 2 (two) times daily. (Patient not taking: No sig reported) 10 capsule 0  . EPINEPHrine 0.3 mg/0.3 mL IJ SOAJ injection Inject 0.3 mg into the muscle once as needed (for allergic reaction).    . feeding supplement, ENSURE  ENLIVE, (ENSURE ENLIVE) LIQD Take 237 mLs by mouth daily. (Patient not taking: Reported on 12/07/2020)    . FLUoxetine (PROZAC) 20 MG capsule Take 80 mg by mouth daily.    Marland Kitchen guaiFENesin (MUCINEX) 600 MG 12 hr tablet Take 1 tablet (600 mg total) by mouth 2 (two) times daily. 30 tablet 0  . methocarbamol (ROBAXIN) 500 MG tablet Take 1 tablet (500 mg total) by mouth every 6 (six) hours as needed for muscle spasms. (Patient not taking: No sig reported) 60 tablet 0  . metoprolol succinate (TOPROL-XL) 25 MG 24 hr tablet Take 0.5 tablets (12.5 mg total) by mouth daily. 30 tablet 0  . oxyCODONE-acetaminophen (PERCOCET) 5-325 MG tablet Take 1-2 tablets by mouth every 6 (six) hours as needed. (Patient not taking: No sig reported) 20 tablet 0  . pantoprazole (PROTONIX) 40 MG tablet Take 1 tablet (40 mg total) by mouth at bedtime. (Patient not taking: No sig reported)    . rosuvastatin (CRESTOR) 40 MG tablet Take 20 mg by mouth daily. (Patient not taking: No sig reported)    . traMADol (ULTRAM) 50 MG tablet Take 100 mg by mouth 4 (four) times daily as needed for moderate pain. (Patient not taking: No sig reported)    . traZODone (DESYREL) 100 MG tablet Take 200 mg by mouth at bedtime.    Marland Kitchen warfarin (COUMADIN) 5 MG tablet Take 1 tablet (5 mg total) by mouth daily at 4 PM. 30 tablet 2   No facility-administered medications prior to visit.     Allergies:   Bee venom and Capsicum (cayenne) [cayenne]   Social History   Socioeconomic History  . Marital status: Single    Spouse name: Not on file  . Number of children: Not on file  . Years of education: Not on file  . Highest education level: Not on file  Occupational History  . Not on file  Tobacco Use  . Smoking status: Former Smoker    Packs/day: 0.50    Types: Cigarettes    Quit date: 09/06/2015    Years since quitting: 5.3  . Smokeless tobacco: Never Used  Vaping Use  . Vaping Use: Former  Substance and Sexual Activity  . Alcohol use: Not  Currently  . Drug use: No  . Sexual activity: Not Currently  Other Topics Concern  . Not on file  Social History Narrative  . Not on file   Social Determinants of Health   Financial Resource Strain: Not on file  Food Insecurity: Not on file  Transportation Needs: Not on file  Physical Activity: Not on file  Stress: Not on file  Social Connections: Not on file     Family History:  The patient's ***family history includes Alcohol abuse in her father.   Review of Systems:   Please see the history of present illness.     General:  No chills, fever, night sweats or weight changes.  Cardiovascular:  No chest pain, dyspnea on exertion, edema, orthopnea, palpitations, paroxysmal nocturnal dyspnea. Dermatological: No rash, lesions/masses Respiratory: No cough, dyspnea Urologic: No hematuria, dysuria Abdominal:   No nausea, vomiting, diarrhea, bright red blood per rectum, melena, or hematemesis Neurologic:  No visual changes, wkns, changes in mental status. All other systems reviewed and are otherwise negative except as noted above.   Physical Exam:    VS:  There were no vitals taken for this visit.   General: Well developed, well nourished,female appearing in no acute distress. Head: Normocephalic, atraumatic. Neck: No carotid bruits. JVD not elevated.  Lungs: Respirations regular and unlabored, without wheezes or rales.  Heart: ***Regular rate and rhythm. No S3 or S4.  No murmur, no rubs, or gallops appreciated. Abdomen: Appears non-distended. No obvious abdominal masses. Msk:  Strength and tone appear normal for age. No obvious joint deformities or effusions. Extremities: No clubbing or cyanosis. No edema.  Distal pedal pulses are 2+ bilaterally. Neuro: Alert and oriented X 3. Moves all extremities spontaneously. No focal deficits noted. Psych:  Responds to questions appropriately with a normal affect. Skin: No rashes or lesions noted  Wt Readings from Last 3 Encounters:   12/23/20 93 lb 0.6 oz (42.2 kg)  10/10/20 105 lb 13.1 oz (48 kg)  09/07/18 109 lb (49.4 kg)        Studies/Labs Reviewed:   EKG:  EKG is*** ordered today.  The ekg ordered today demonstrates ***  Recent Labs: 12/12/2020: B Natriuretic Peptide 500.0 12/14/2020: ALT 21 12/23/2020: Magnesium 2.1 12/24/2020: BUN 23; Creatinine, Ser 1.37; Hemoglobin 12.0; Platelets 298; Potassium 4.1; Sodium 141   Lipid Panel    Component Value Date/Time   CHOL 199 12/16/2020 0334   TRIG 91 12/16/2020 0334   HDL 44 12/16/2020 0334   CHOLHDL 4.5 12/16/2020 0334   VLDL 18 12/16/2020 0334   LDLCALC 137 (H) 12/16/2020 0334    Additional studies/ records that were reviewed today include:   Echocardiogram: 12/09/2020 IMPRESSIONS    1. The mid to distal anteroseptal,anterior and anterolateral walls are  hypokinetic. The apex is hypokinetic. There is a 0.7 x 1.3 cm mural apical  thrombus. . Left ventricular ejection fraction, by estimation, is 25 to  30%. The left ventricle has  severely decreased function. The left ventricle demonstrates regional wall  motion abnormalities (see scoring diagram/findings for description). There  is moderate left ventricular hypertrophy. Left ventricular diastolic  parameters are consistent with  Grade II diastolic dysfunction (pseudonormalization). Elevated left atrial  pressure.  2. Right ventricular systolic function is normal. The right ventricular  size is normal.  3. Left atrial size was mildly dilated.  4. The mitral valve is normal in structure. Trivial mitral valve  regurgitation. No evidence of mitral stenosis. Moderate mitral annular  calcification.  5. The aortic valve was not well visualized. There is mild calcification  of the aortic valve. There is mild thickening of the aortic valve. Aortic  valve regurgitation is not visualized. No aortic stenosis is present.  6. The inferior vena cava is normal in size with greater than 50%   respiratory variability, suggesting right atrial pressure of 3 mmHg.   Assessment:    No diagnosis found.   Plan:   In order of problems listed above:  1. ***    Shared Decision Making/Informed Consent:   {Are you ordering a CV Procedure (e.g. stress test, cath, DCCV, TEE, etc)?   Press F2        :[id number]}  Medication Adjustments/Labs and Tests Ordered: Current medicines are reviewed at length with the patient today.  Concerns regarding medicines are outlined above.  Medication changes, Labs and Tests ordered today are listed in the Patient Instructions below. There are no Patient Instructions on file for this visit.   Signed, Erma Heritage, PA-C  01/24/2021 10:53 AM    [place] [address] [place], [place] [address] Phone: [phone] Fax: [phone]

## 2021-01-27 DIAGNOSIS — Z5181 Encounter for therapeutic drug level monitoring: Secondary | ICD-10-CM | POA: Diagnosis not present

## 2021-01-27 DIAGNOSIS — F419 Anxiety disorder, unspecified: Secondary | ICD-10-CM | POA: Diagnosis not present

## 2021-01-27 DIAGNOSIS — I4891 Unspecified atrial fibrillation: Secondary | ICD-10-CM | POA: Diagnosis not present

## 2021-01-27 DIAGNOSIS — I63512 Cerebral infarction due to unspecified occlusion or stenosis of left middle cerebral artery: Secondary | ICD-10-CM | POA: Diagnosis not present

## 2021-01-27 DIAGNOSIS — J690 Pneumonitis due to inhalation of food and vomit: Secondary | ICD-10-CM | POA: Diagnosis not present

## 2021-01-27 DIAGNOSIS — F32A Depression, unspecified: Secondary | ICD-10-CM | POA: Diagnosis not present

## 2021-01-27 DIAGNOSIS — Z7901 Long term (current) use of anticoagulants: Secondary | ICD-10-CM | POA: Diagnosis not present

## 2021-01-27 DIAGNOSIS — G43909 Migraine, unspecified, not intractable, without status migrainosus: Secondary | ICD-10-CM | POA: Diagnosis not present

## 2021-01-28 ENCOUNTER — Inpatient Hospital Stay (HOSPITAL_COMMUNITY)
Admission: EM | Admit: 2021-01-28 | Discharge: 2021-01-31 | DRG: 682 | Disposition: A | Discharge status: Skilled Nursing Facility | Payer: Medicare HMO | Source: Skilled Nursing Facility | Attending: Internal Medicine | Admitting: Internal Medicine

## 2021-01-28 ENCOUNTER — Encounter (HOSPITAL_COMMUNITY): Payer: Self-pay | Admitting: Family Medicine

## 2021-01-28 ENCOUNTER — Emergency Department (HOSPITAL_COMMUNITY): Payer: Medicare HMO

## 2021-01-28 DIAGNOSIS — Z7401 Bed confinement status: Secondary | ICD-10-CM | POA: Diagnosis not present

## 2021-01-28 DIAGNOSIS — I252 Old myocardial infarction: Secondary | ICD-10-CM

## 2021-01-28 DIAGNOSIS — I499 Cardiac arrhythmia, unspecified: Secondary | ICD-10-CM | POA: Diagnosis not present

## 2021-01-28 DIAGNOSIS — N179 Acute kidney failure, unspecified: Principal | ICD-10-CM | POA: Diagnosis present

## 2021-01-28 DIAGNOSIS — Z79899 Other long term (current) drug therapy: Secondary | ICD-10-CM

## 2021-01-28 DIAGNOSIS — G43909 Migraine, unspecified, not intractable, without status migrainosus: Secondary | ICD-10-CM | POA: Diagnosis present

## 2021-01-28 DIAGNOSIS — I639 Cerebral infarction, unspecified: Secondary | ICD-10-CM | POA: Diagnosis not present

## 2021-01-28 DIAGNOSIS — F419 Anxiety disorder, unspecified: Secondary | ICD-10-CM | POA: Diagnosis present

## 2021-01-28 DIAGNOSIS — Z66 Do not resuscitate: Secondary | ICD-10-CM | POA: Diagnosis present

## 2021-01-28 DIAGNOSIS — R509 Fever, unspecified: Secondary | ICD-10-CM

## 2021-01-28 DIAGNOSIS — U071 COVID-19: Secondary | ICD-10-CM | POA: Diagnosis not present

## 2021-01-28 DIAGNOSIS — D75839 Thrombocytosis, unspecified: Secondary | ICD-10-CM | POA: Diagnosis present

## 2021-01-28 DIAGNOSIS — K449 Diaphragmatic hernia without obstruction or gangrene: Secondary | ICD-10-CM | POA: Diagnosis not present

## 2021-01-28 DIAGNOSIS — Z90710 Acquired absence of both cervix and uterus: Secondary | ICD-10-CM

## 2021-01-28 DIAGNOSIS — R6510 Systemic inflammatory response syndrome (SIRS) of non-infectious origin without acute organ dysfunction: Secondary | ICD-10-CM

## 2021-01-28 DIAGNOSIS — Z7901 Long term (current) use of anticoagulants: Secondary | ICD-10-CM

## 2021-01-28 DIAGNOSIS — N1832 Chronic kidney disease, stage 3b: Secondary | ICD-10-CM | POA: Diagnosis present

## 2021-01-28 DIAGNOSIS — I130 Hypertensive heart and chronic kidney disease with heart failure and stage 1 through stage 4 chronic kidney disease, or unspecified chronic kidney disease: Secondary | ICD-10-CM | POA: Diagnosis not present

## 2021-01-28 DIAGNOSIS — R4182 Altered mental status, unspecified: Secondary | ICD-10-CM | POA: Diagnosis not present

## 2021-01-28 DIAGNOSIS — I4510 Unspecified right bundle-branch block: Secondary | ICD-10-CM | POA: Diagnosis present

## 2021-01-28 DIAGNOSIS — Z91018 Allergy to other foods: Secondary | ICD-10-CM

## 2021-01-28 DIAGNOSIS — N390 Urinary tract infection, site not specified: Secondary | ICD-10-CM | POA: Diagnosis not present

## 2021-01-28 DIAGNOSIS — I671 Cerebral aneurysm, nonruptured: Secondary | ICD-10-CM | POA: Diagnosis not present

## 2021-01-28 DIAGNOSIS — R404 Transient alteration of awareness: Secondary | ICD-10-CM | POA: Diagnosis not present

## 2021-01-28 DIAGNOSIS — F32A Depression, unspecified: Secondary | ICD-10-CM | POA: Diagnosis present

## 2021-01-28 DIAGNOSIS — Z7982 Long term (current) use of aspirin: Secondary | ICD-10-CM

## 2021-01-28 DIAGNOSIS — J439 Emphysema, unspecified: Secondary | ICD-10-CM | POA: Diagnosis not present

## 2021-01-28 DIAGNOSIS — J9611 Chronic respiratory failure with hypoxia: Secondary | ICD-10-CM | POA: Diagnosis not present

## 2021-01-28 DIAGNOSIS — E875 Hyperkalemia: Secondary | ICD-10-CM | POA: Diagnosis present

## 2021-01-28 DIAGNOSIS — Y92230 Patient room in hospital as the place of occurrence of the external cause: Secondary | ICD-10-CM | POA: Diagnosis present

## 2021-01-28 DIAGNOSIS — R0689 Other abnormalities of breathing: Secondary | ICD-10-CM | POA: Diagnosis not present

## 2021-01-28 DIAGNOSIS — I513 Intracardiac thrombosis, not elsewhere classified: Secondary | ICD-10-CM | POA: Diagnosis not present

## 2021-01-28 DIAGNOSIS — Z974 Presence of external hearing-aid: Secondary | ICD-10-CM

## 2021-01-28 DIAGNOSIS — E876 Hypokalemia: Secondary | ICD-10-CM | POA: Diagnosis not present

## 2021-01-28 DIAGNOSIS — E861 Hypovolemia: Secondary | ICD-10-CM | POA: Diagnosis present

## 2021-01-28 DIAGNOSIS — I5042 Chronic combined systolic (congestive) and diastolic (congestive) heart failure: Secondary | ICD-10-CM | POA: Diagnosis not present

## 2021-01-28 DIAGNOSIS — R1310 Dysphagia, unspecified: Secondary | ICD-10-CM | POA: Diagnosis present

## 2021-01-28 DIAGNOSIS — Z87891 Personal history of nicotine dependence: Secondary | ICD-10-CM | POA: Diagnosis not present

## 2021-01-28 DIAGNOSIS — I6389 Other cerebral infarction: Secondary | ICD-10-CM | POA: Diagnosis not present

## 2021-01-28 DIAGNOSIS — J189 Pneumonia, unspecified organism: Secondary | ICD-10-CM | POA: Diagnosis not present

## 2021-01-28 DIAGNOSIS — J9620 Acute and chronic respiratory failure, unspecified whether with hypoxia or hypercapnia: Secondary | ICD-10-CM | POA: Diagnosis present

## 2021-01-28 DIAGNOSIS — J9621 Acute and chronic respiratory failure with hypoxia: Secondary | ICD-10-CM | POA: Diagnosis not present

## 2021-01-28 DIAGNOSIS — M2550 Pain in unspecified joint: Secondary | ICD-10-CM | POA: Diagnosis not present

## 2021-01-28 DIAGNOSIS — R0602 Shortness of breath: Secondary | ICD-10-CM | POA: Diagnosis not present

## 2021-01-28 DIAGNOSIS — Z8673 Personal history of transient ischemic attack (TIA), and cerebral infarction without residual deficits: Secondary | ICD-10-CM | POA: Diagnosis not present

## 2021-01-28 DIAGNOSIS — G9389 Other specified disorders of brain: Secondary | ICD-10-CM | POA: Diagnosis not present

## 2021-01-28 DIAGNOSIS — Z91030 Bee allergy status: Secondary | ICD-10-CM

## 2021-01-28 DIAGNOSIS — W19XXXA Unspecified fall, initial encounter: Secondary | ICD-10-CM | POA: Diagnosis not present

## 2021-01-28 DIAGNOSIS — A419 Sepsis, unspecified organism: Secondary | ICD-10-CM | POA: Diagnosis not present

## 2021-01-28 DIAGNOSIS — I6782 Cerebral ischemia: Secondary | ICD-10-CM | POA: Diagnosis not present

## 2021-01-28 DIAGNOSIS — E785 Hyperlipidemia, unspecified: Secondary | ICD-10-CM | POA: Diagnosis present

## 2021-01-28 DIAGNOSIS — R0902 Hypoxemia: Secondary | ICD-10-CM | POA: Diagnosis not present

## 2021-01-28 DIAGNOSIS — E860 Dehydration: Secondary | ICD-10-CM | POA: Diagnosis present

## 2021-01-28 DIAGNOSIS — Z7951 Long term (current) use of inhaled steroids: Secondary | ICD-10-CM

## 2021-01-28 DIAGNOSIS — Z743 Need for continuous supervision: Secondary | ICD-10-CM | POA: Diagnosis not present

## 2021-01-28 LAB — TROPONIN I (HIGH SENSITIVITY)
Troponin I (High Sensitivity): 18 ng/L — ABNORMAL HIGH (ref <18)
Troponin I (High Sensitivity): 16 ng/L (ref <18)

## 2021-01-28 LAB — CBC WITH DIFFERENTIAL/PLATELET
Abs Immature Granulocytes: 0.1 10*3/uL — ABNORMAL HIGH (ref 0.00–0.07)
Basophils Absolute: 0 10*3/uL (ref 0.0–0.1)
Basophils Relative: 0 %
Eosinophils Absolute: 0 10*3/uL (ref 0.0–0.5)
Eosinophils Relative: 0 %
HCT: 46.4 % — ABNORMAL HIGH (ref 36.0–46.0)
Hemoglobin: 14.5 g/dL (ref 12.0–15.0)
Immature Granulocytes: 1 %
Lymphocytes Relative: 13 %
Lymphs Abs: 2 10*3/uL (ref 0.7–4.0)
MCH: 28.1 pg (ref 26.0–34.0)
MCHC: 31.3 g/dL (ref 30.0–36.0)
MCV: 89.9 fL (ref 80.0–100.0)
Monocytes Absolute: 1.7 10*3/uL — ABNORMAL HIGH (ref 0.1–1.0)
Monocytes Relative: 11 %
Neutro Abs: 11.6 10*3/uL — ABNORMAL HIGH (ref 1.7–7.7)
Neutrophils Relative %: 75 %
Platelets: 442 10*3/uL — ABNORMAL HIGH (ref 150–400)
RBC: 5.16 MIL/uL — ABNORMAL HIGH (ref 3.87–5.11)
RDW: 13.5 % (ref 11.5–15.5)
WBC: 15.4 10*3/uL — ABNORMAL HIGH (ref 4.0–10.5)
nRBC: 0 % (ref 0.0–0.2)

## 2021-01-28 LAB — LACTIC ACID, PLASMA: Lactic Acid, Venous: 1.3 mmol/L (ref 0.5–1.9)

## 2021-01-28 LAB — BASIC METABOLIC PANEL
Anion gap: 14 (ref 5–15)
BUN: 65 mg/dL — ABNORMAL HIGH (ref 8–23)
CO2: 24 mmol/L (ref 22–32)
Calcium: 8.4 mg/dL — ABNORMAL LOW (ref 8.9–10.3)
Chloride: 102 mmol/L (ref 98–111)
Creatinine, Ser: 2.42 mg/dL — ABNORMAL HIGH (ref 0.44–1.00)
GFR, Estimated: 21 mL/min — ABNORMAL LOW (ref >60)
Glucose, Bld: 102 mg/dL — ABNORMAL HIGH (ref 70–99)
Potassium: 5.9 mmol/L — ABNORMAL HIGH (ref 3.5–5.1)
Sodium: 140 mmol/L (ref 135–145)

## 2021-01-28 LAB — LACTATE DEHYDROGENASE: LDH: 168 U/L (ref 98–192)

## 2021-01-28 LAB — COMPREHENSIVE METABOLIC PANEL
ALT: 19 U/L (ref 0–44)
AST: 21 U/L (ref 15–41)
Albumin: 3.2 g/dL — ABNORMAL LOW (ref 3.5–5.0)
Alkaline Phosphatase: 120 U/L (ref 38–126)
Anion gap: 21 — ABNORMAL HIGH (ref 5–15)
BUN: 71 mg/dL — ABNORMAL HIGH (ref 8–23)
CO2: 20 mmol/L — ABNORMAL LOW (ref 22–32)
Calcium: 9.2 mg/dL (ref 8.9–10.3)
Chloride: 100 mmol/L (ref 98–111)
Creatinine, Ser: 2.56 mg/dL — ABNORMAL HIGH (ref 0.44–1.00)
GFR, Estimated: 20 mL/min — ABNORMAL LOW (ref >60)
Glucose, Bld: 125 mg/dL — ABNORMAL HIGH (ref 70–99)
Potassium: 3.9 mmol/L (ref 3.5–5.1)
Sodium: 141 mmol/L (ref 135–145)
Total Bilirubin: 1.5 mg/dL — ABNORMAL HIGH (ref 0.3–1.2)
Total Protein: 7.9 g/dL (ref 6.5–8.1)

## 2021-01-28 LAB — PROTIME-INR
INR: 1.2 (ref 0.8–1.2)
Prothrombin Time: 14.5 seconds (ref 11.4–15.2)

## 2021-01-28 LAB — BRAIN NATRIURETIC PEPTIDE: B Natriuretic Peptide: 40.4 pg/mL (ref 0.0–100.0)

## 2021-01-28 LAB — I-STAT CHEM 8, ED
BUN: 112 mg/dL — ABNORMAL HIGH (ref 8–23)
Calcium, Ion: 0.99 mmol/L — ABNORMAL LOW (ref 1.15–1.40)
Chloride: 104 mmol/L (ref 98–111)
Creatinine, Ser: 2.5 mg/dL — ABNORMAL HIGH (ref 0.44–1.00)
Glucose, Bld: 123 mg/dL — ABNORMAL HIGH (ref 70–99)
HCT: 43 % (ref 36.0–46.0)
Hemoglobin: 14.6 g/dL (ref 12.0–15.0)
Potassium: 6.5 mmol/L (ref 3.5–5.1)
Sodium: 139 mmol/L (ref 135–145)
TCO2: 27 mmol/L (ref 22–32)

## 2021-01-28 LAB — SARS CORONAVIRUS 2 BY RT PCR (HOSPITAL ORDER, PERFORMED IN ~~LOC~~ HOSPITAL LAB): SARS Coronavirus 2: POSITIVE — AB

## 2021-01-28 LAB — TRIGLYCERIDES: Triglycerides: 125 mg/dL (ref <150)

## 2021-01-28 LAB — C-REACTIVE PROTEIN: CRP: 8.1 mg/dL — ABNORMAL HIGH (ref <1.0)

## 2021-01-28 LAB — PROCALCITONIN: Procalcitonin: 0.12 ng/mL

## 2021-01-28 LAB — APTT: aPTT: 33 seconds (ref 24–36)

## 2021-01-28 LAB — FERRITIN: Ferritin: 83 ng/mL (ref 11–307)

## 2021-01-28 LAB — D-DIMER, QUANTITATIVE: D-Dimer, Quant: 0.63 ug/mL-FEU — ABNORMAL HIGH (ref 0.00–0.50)

## 2021-01-28 LAB — FIBRINOGEN: Fibrinogen: 617 mg/dL — ABNORMAL HIGH (ref 210–475)

## 2021-01-28 MED ORDER — SODIUM ZIRCONIUM CYCLOSILICATE 10 G PO PACK
10.0000 g | PACK | Freq: Once | ORAL | Status: AC
  Administered 2021-01-29: 10 g via ORAL
  Filled 2021-01-28: qty 1

## 2021-01-28 MED ORDER — SODIUM CHLORIDE 0.9 % IV SOLN
500.0000 mg | INTRAVENOUS | Status: DC
  Administered 2021-01-28: 500 mg via INTRAVENOUS
  Filled 2021-01-28: qty 500

## 2021-01-28 MED ORDER — LACTATED RINGERS IV SOLN: INTRAVENOUS | Status: DC

## 2021-01-28 MED ORDER — SENNOSIDES-DOCUSATE SODIUM 8.6-50 MG PO TABS: 1.0000 | ORAL_TABLET | Freq: Every evening | ORAL | Status: DC | PRN

## 2021-01-28 MED ORDER — SODIUM BICARBONATE 8.4 % IV SOLN
Freq: Once | INTRAVENOUS | Status: DC
  Filled 2021-01-28: qty 100

## 2021-01-28 MED ORDER — DEXTROSE 50 % IV SOLN
1.0000 | Freq: Once | INTRAVENOUS | Status: DC
  Filled 2021-01-28: qty 50

## 2021-01-28 MED ORDER — LACTATED RINGERS IV BOLUS (SEPSIS)
500.0000 mL | Freq: Once | INTRAVENOUS | Status: AC
  Administered 2021-01-28: 500 mL via INTRAVENOUS

## 2021-01-28 MED ORDER — LACTATED RINGERS IV SOLN: INTRAVENOUS | Status: AC

## 2021-01-28 MED ORDER — ACETAMINOPHEN 650 MG RE SUPP
650.0000 mg | Freq: Once | RECTAL | Status: AC
  Administered 2021-01-28: 650 mg via RECTAL
  Filled 2021-01-28: qty 1

## 2021-01-28 MED ORDER — SODIUM CHLORIDE 0.9 % IV SOLN
2.0000 g | INTRAVENOUS | Status: DC
  Administered 2021-01-28: 2 g via INTRAVENOUS
  Filled 2021-01-28: qty 20

## 2021-01-28 MED ORDER — ONDANSETRON HCL 4 MG/2ML IJ SOLN: 4.0000 mg | Freq: Four times a day (QID) | INTRAMUSCULAR | Status: DC | PRN

## 2021-01-28 MED ORDER — IPRATROPIUM-ALBUTEROL 20-100 MCG/ACT IN AERS
1.0000 | INHALATION_SPRAY | Freq: Four times a day (QID) | RESPIRATORY_TRACT | Status: DC
  Administered 2021-01-29 – 2021-01-31 (×10): 1 via RESPIRATORY_TRACT
  Filled 2021-01-28: qty 4

## 2021-01-28 MED ORDER — ACETAMINOPHEN 325 MG PO TABS: 650.0000 mg | ORAL_TABLET | Freq: Four times a day (QID) | ORAL | Status: DC | PRN

## 2021-01-28 MED ORDER — ONDANSETRON HCL 4 MG PO TABS: 4.0000 mg | ORAL_TABLET | Freq: Four times a day (QID) | ORAL | Status: DC | PRN

## 2021-01-28 MED ORDER — INSULIN ASPART 100 UNIT/ML IV SOLN: 5.0000 [IU] | Freq: Once | INTRAVENOUS | Status: DC

## 2021-01-28 NOTE — H&P (Signed)
History and Physical    Mindy Mills JKK:[id number] DOB: 10-19-1951 DOA: 01/28/2021  PCP: Patient, No Pcp Per   Patient coming from: SNF   Chief Complaint: Lethargy, decreased responsiveness   HPI: Mindy Mills is a 70 y.o. female with medical history significant for remote hemorrhagic CVA, chronic kidney disease 3B, chronic combined systolic and diastolic CHF, COPD with chronic hypoxic respiratory failure, LV mural thrombus with cardioembolic CVA in December 7169 now on warfarin, and dysphagia, now presenting to the emergency department from her SNF for evaluation of lethargy and decreased responsivenes. History was obtained through discussion with ED personnel, review of EMR, and discussion with the patient's daughter-in-law who is a Marine scientist. Patient was reportedly diagnosed with COVID-19 approximately 1 week ago, was noticed to have worsening lethargy, fatigue, and loss of appetite over the past few days. She was said to be poorly responsive during therapy, oxygen saturation was 91% on her usual 3 L/min of supplemental oxygen, EMS was called, she was placed on nonrebreather, and sent to the ED.   Per daughter-in-law, at baseline the patient does not have comprehensible speech since the most recent CVA, requires assistance with all ADLs, takes thickened liquids, and would not want any heroic measures.  ED Course: Upon arrival to the ED, patient is found to have a temperature of 37.9 C, saturating mid to upper 90s on 3 L/min of supplemental oxygen, and with stable blood pressure. EKG features sinus rhythm with RBBB. Chest x-ray with emphysematous changes but no acute cardiopulmonary disease. Chemistry panel notable for potassium of 5.9, BUN 71, and creatinine 2.56. CBC notable for leukocytosis to 15,400 and a mild thrombocytosis. COVID-19 is positive. Procalcitonin is low. CRP 8.1. D-dimer 0.63. INR subtherapeutic at 1.2. High-sensitivity troponin is slightly elevated. Patient was given 500 cc of  LR, acetaminophen, Rocephin, and azithromycin in the ED.  Review of Systems:  Unable to complete ROS secondary to patient's clinical condition.  Past Medical History:  Diagnosis Date  . Cancer ([place])    Cervicle  . Full dentures   . Hepatitis C    denies  . Hypertension   . Migraine   . Myocardial infarction ([place])   . Stroke [facility]) 2015   Pt reports hx of CVA x3.  Last one in 2015  . Wears glasses   . Wears hearing aid in both ears     Past Surgical History:  Procedure Laterality Date  . ABDOMINAL HYSTERECTOMY    . BUNIONECTOMY    . COLONOSCOPY    . MULTIPLE TOOTH EXTRACTIONS    . OPEN REDUCTION INTERNAL FIXATION (ORIF) DISTAL RADIAL FRACTURE Right 09/07/2018   Procedure: OPEN REDUCTION INTERNAL FIXATION (ORIF) DISTAL RADIAL FRACTURE;  Surgeon: Iran Planas, MD;  Location: [place];  Service: Orthopedics;  Laterality: Right;  . PEG PLACEMENT N/A 09/21/2014   Procedure: PERCUTANEOUS ENDOSCOPIC GASTROSTOMY (PEG) PLACEMENT (BED SIDE);  Surgeon: Georganna Skeans, MD;  Location: [place];  Service: General;  Laterality: N/A;  . RADIOLOGY WITH ANESTHESIA N/A 09/11/2014   Procedure: Sub Arachnoid Aneurysm Coiling -RADIOLOGY WITH ANESTHESIA ;  Surgeon: Consuella Lose, MD;  Location: [place];  Service: Radiology;  Laterality: N/A;    Social History:   reports that she quit smoking about 5 years ago. Her smoking use included cigarettes. She smoked 0.50 packs per day. She has never used smokeless tobacco. She reports previous alcohol use. She reports that she does not use drugs.  Allergies  Allergen Reactions  . Bee Venom Anaphylaxis and Hives  .  Capsicum Gretta Arab) [Cayenne] Swelling    Family History  Problem Relation Age of Onset  . Alcohol abuse Father      Prior to Admission medications   Medication Sig Start Date End Date Taking? Authorizing Provider  albuterol (PROVENTIL) (2.5 MG/3ML) 0.083% nebulizer solution Take 3 mLs (2.5 mg total) by nebulization every 4 (four) hours  as needed for wheezing or shortness of breath. 12/24/20   Pahwani, Michell Heinrich, MD  arformoterol (BROVANA) 15 MCG/2ML NEBU Take 2 mLs (15 mcg total) by nebulization 2 (two) times daily. 12/24/20   Pahwani, Michell Heinrich, MD  aspirin 325 MG EC tablet Take 325 mg by mouth daily.    [provider]  budesonide (PULMICORT) 0.5 MG/2ML nebulizer solution Take 2 mLs (0.5 mg total) by nebulization 2 (two) times daily. 12/24/20   Pahwani, Michell Heinrich, MD  cholecalciferol (VITAMIN D) 1000 units tablet Take 1,000 Units by mouth daily.    [provider]  clonazePAM (KLONOPIN) 0.5 MG tablet Take 1 tablet (0.5 mg total) by mouth 2 (two) times daily. Patient not taking: No sig reported 09/11/15   Jonetta Osgood, MD  docusate sodium (COLACE) 100 MG capsule Take 1 capsule (100 mg total) by mouth 2 (two) times daily. Patient not taking: No sig reported 09/09/18   Gertie Fey Bonham, PA  EPINEPHrine 0.3 mg/0.3 mL IJ SOAJ injection Inject 0.3 mg into the muscle once as needed (for allergic reaction).    [provider]  feeding supplement, ENSURE ENLIVE, (ENSURE ENLIVE) LIQD Take 237 mLs by mouth daily. Patient not taking: Reported on 12/07/2020 09/11/15   Jonetta Osgood, MD  FLUoxetine (PROZAC) 20 MG capsule Take 80 mg by mouth daily.    [provider]  guaiFENesin (MUCINEX) 600 MG 12 hr tablet Take 1 tablet (600 mg total) by mouth 2 (two) times daily. 12/24/20   Pahwani, Michell Heinrich, MD  methocarbamol (ROBAXIN) 500 MG tablet Take 1 tablet (500 mg total) by mouth every 6 (six) hours as needed for muscle spasms. Patient not taking: No sig reported 09/09/18   Brynda Peon, PA  metoprolol succinate (TOPROL-XL) 25 MG 24 hr tablet Take 0.5 tablets (12.5 mg total) by mouth daily. 12/25/20   Pahwani, Michell Heinrich, MD  oxyCODONE-acetaminophen (PERCOCET) 5-325 MG tablet Take 1-2 tablets by mouth every 6 (six) hours as needed. Patient not taking: No sig reported 08/31/18   Charlesetta Shanks, MD   pantoprazole (PROTONIX) 40 MG tablet Take 1 tablet (40 mg total) by mouth at bedtime. Patient not taking: No sig reported 09/11/15   Jonetta Osgood, MD  rosuvastatin (CRESTOR) 40 MG tablet Take 20 mg by mouth daily. Patient not taking: No sig reported    [provider]  traMADol (ULTRAM) 50 MG tablet Take 100 mg by mouth 4 (four) times daily as needed for moderate pain. Patient not taking: No sig reported    [provider]  traZODone (DESYREL) 100 MG tablet Take 200 mg by mouth at bedtime.    [provider]  warfarin (COUMADIN) 5 MG tablet Take 1 tablet (5 mg total) by mouth daily at 4 PM. 12/25/20   Mckinley Jewel, MD    Physical Exam: Vitals:   01/28/21 2045 01/28/21 2115 01/28/21 2200 01/28/21 2300  BP: 123/82 128/80 122/77 106/74  Pulse: 76 73 70 73  Resp: '19 17 17 16  ' Temp:    98.9 F (37.2 C)  TempSrc:    Oral  SpO2: 93% 95% 96% 96%  Constitutional: NAD, calm, cachectic   Eyes: PERTLA, lids and conjunctivae normal ENMT: Mucous membranes are moist. Posterior pharynx clear of any exudate or lesions.   Neck: normal, supple, no masses, no thyromegaly Respiratory: no wheezing, no crackles. No accessory muscle use.  Cardiovascular: S1 & S2 heard, regular rate and rhythm. No extremity edema.   Abdomen: No distension, no tenderness, soft. Bowel sounds active.  Musculoskeletal: no clubbing / cyanosis. No joint deformity upper and lower extremities.   Skin: no significant rashes, lesions, ulcers. Warm, dry, well-perfused. Neurologic: No gross facial asymmetry. Sensation intact. Moving all extremities.  Psychiatric: Alert, makes eye-contact. No comprehensible speech, unable to answer yes/no questions with head nod. Calm and cooperative.    Labs and Imaging on Admission: I have personally reviewed following labs and imaging studies  CBC: Recent Labs  Lab 01/28/21 1842 01/28/21 1947  WBC 15.4*  --   NEUTROABS 11.6*  --   HGB 14.5 14.6   HCT 46.4* 43.0  MCV 89.9  --   PLT 442*  --    Basic Metabolic Panel: Recent Labs  Lab 01/28/21 1842 01/28/21 1947 01/28/21 2125  NA 141 139 140  K 3.9 6.5* 5.9*  CL 100 104 102  CO2 20*  --  24  GLUCOSE 125* 123* 102*  BUN 71* 112* 65*  CREATININE 2.56* 2.50* 2.42*  CALCIUM 9.2  --  8.4*   GFR: CrCl cannot be calculated (Unknown ideal weight.). Liver Function Tests: Recent Labs  Lab 01/28/21 1842  AST 21  ALT 19  ALKPHOS 120  BILITOT 1.5*  PROT 7.9  ALBUMIN 3.2*   No results for input(s): LIPASE, AMYLASE in the last 168 hours. No results for input(s): AMMONIA in the last 168 hours. Coagulation Profile: Recent Labs  Lab 01/28/21 1842  INR 1.2   Cardiac Enzymes: No results for input(s): CKTOTAL, CKMB, CKMBINDEX, TROPONINI in the last 168 hours. BNP (last 3 results) No results for input(s): PROBNP in the last 8760 hours. HbA1C: No results for input(s): HGBA1C in the last 72 hours. CBG: No results for input(s): GLUCAP in the last 168 hours. Lipid Profile: Recent Labs    01/28/21 1845  TRIG 125   Thyroid Function Tests: No results for input(s): TSH, T4TOTAL, FREET4, T3FREE, THYROIDAB in the last 72 hours. Anemia Panel: Recent Labs    01/28/21 1842  FERRITIN 83   Urine analysis:    Component Value Date/Time   COLORURINE YELLOW 12/13/2020 1825   APPEARANCEUR CLEAR 12/13/2020 1825   LABSPEC >1.046 (H) 12/13/2020 1825   PHURINE 6.0 12/13/2020 1825   GLUCOSEU NEGATIVE 12/13/2020 1825   HGBUR NEGATIVE 12/13/2020 1825   BILIRUBINUR NEGATIVE 12/13/2020 1825   KETONESUR 5 (A) 12/13/2020 1825   PROTEINUR NEGATIVE 12/13/2020 1825   UROBILINOGEN 1.0 09/08/2015 1817   NITRITE NEGATIVE 12/13/2020 1825   LEUKOCYTESUR NEGATIVE 12/13/2020 1825   Sepsis Labs: '@LABRCNTIP' (procalcitonin:4,lacticidven:4) ) Recent Results (from the past 240 hour(s))  SARS Coronavirus 2 by RT PCR (hospital order, performed in [place] hospital lab) Nasopharyngeal  Nasopharyngeal Swab     Status: Abnormal   Collection Time: 01/28/21  6:42 PM   Specimen: Nasopharyngeal Swab  Result Value Ref Range Status   SARS Coronavirus 2 POSITIVE (A) NEGATIVE Final    Comment: RESULT CALLED TO, READ BACK BY AND VERIFIED WITH: RN MOLLY  C 2036 [id number] FCP (NOTE) SARS-CoV-2 target nucleic acids are DETECTED  SARS-CoV-2 RNA is generally detectable in upper respiratory specimens  during the acute phase of infection.  Positive results are indicative  of the presence of the identified virus, but do not rule out bacterial infection or co-infection with other pathogens not detected by the test.  Clinical correlation with patient history and  other diagnostic information is necessary to determine patient infection status.  The expected result is negative.  Fact Sheet for Patients:   StrictlyIdeas.no   Fact Sheet for Healthcare Providers:   BankingDealers.co.za    This test is not yet approved or cleared by the [place] FDA and  has been authorized for detection and/or diagnosis of SARS-CoV-2 by FDA under an Emergency Use Authorization (EUA).  This EUA will remain in effect (meaning this test can  be used) for the duration of  the COVID-19 declaration under Section 564(b)(1) of the Act, 21 U.S.C. section 360-bbb-3(b)(1), unless the authorization is terminated or revoked sooner.  Performed at [facility], [place] [address], [place], [place] [address]      Radiological Exams on Admission: DG Chest Port 1 View  Result Date: 01/28/2021 CLINICAL DATA:  Questionable sepsis - evaluate for abnormality lethargy. Decreased responsiveness. EXAM: PORTABLE CHEST 1 VIEW COMPARISON:  Radiograph and CT 12/07/2020. FINDINGS: Emphysema with chronic hyperinflation. Improved bronchial thickening from prior exam. Stable heart size and mediastinal contours. Aortic atherosclerosis. Retrocardiac hiatal hernia. No acute airspace  disease. No pneumothorax or large pleural effusion. No pulmonary edema. Remote left clavicle fracture. Bones are diffusely under mineralized. Patient is rotated. IMPRESSION: Emphysema with chronic hyperinflation. Improved bronchial thickening from prior exam. No acute airspace disease. Electronically Signed   By: Keith Rake M.D.   On: 01/28/2021 19:13    EKG: Independently reviewed. Sinus rhythm, RBBB.   Assessment/Plan   1. Acute kidney injury superimposed on CKD IIIb; hyperkalemia  - Presents from SNF where she has been more lethargic and not eating or drinking much since recent COVID diagnosis and is found to have potassium 5.9 and SCr of 2.56, up from 1.37 a month earlier  - Likely acute prerenal azotemia in setting of not eating or drinking, loss of appetite likely from acute viral illness  - She was given 500 cc IVF in ED  - Give Lokelma, check FENa, continue gentle IVF hydration mindful or low EF, renally-dose medications, follow potassium levels and renal function    2. COVID-19  - Reportedly diagnosed about a week ago at SNF, confirmed to be positive in ED  - CXR is clear and she appears comfortable on her usual 3 Lpm supplemental O2  - Continue isolation, supportive care, and monitoring for now   3. COPD; chronic hypoxic respiratory failure  - No wheezing in ED, stable on her usual 3 Lpm supplemental O2  - Replace nebs with combivent for now while on COVID precautions    4. LV thrombus; hx cardio-embolic CVA  - INR is subtherapeutic in ED, family confirms she is still taking, will consult pharmacy for assistance with dosing   5. Chronic combined CHF  - Echo from December 2021 with EF 25-30%, regional wall-motion abnormalities, hypokinetic apex with mural thrombus, and grade 2 diastolic dysfunction  - Appears hypovolemic in ED and is being gently hydrated with IVF  - Monitor I/Os and weight   6. SIRS  - Met SIRS criteria in ED with tachypnea and WBC 15,400 - Lactate  wnl, procalcitonin low, tachypnea resolved and may have been due to [place] though CXR is clear  - There is no obvious bacterial infection, plan to watch off of abx for now,  follow cultures    DVT prophylaxis: Warfarin  Code Status: DNR, confirmed with family  Level of Care: Level of care: Telemetry Medical Family Communication: Discussed with daughter-in-law Roni Bread) by phone at time of admission  Disposition Plan:  Patient is from: SNF  Anticipated d/c is to: SNF  Anticipated d/c date is: 01/31/21 Patient currently: Pending improvement in renal function  Consults called: None  Admission status: Inpatient     Vianne Bulls, MD [facility]  01/28/2021, 11:24 PM

## 2021-01-28 NOTE — ED Provider Notes (Signed)
[facility] EMERGENCY DEPARTMENT Provider Note   CSN: [id number] Arrival date & time: 01/28/21  1840     History Chief Complaint  Patient presents with  . Code Sepsis    Mindy Mills is a 70 y.o. female.  70 year old female with prior medical history as detailed below presents for evaluation.  Patient is a resident at a [facility].  Per EMS report patient has a been diagnosed with Covid approximately 1 week prior.  She was just moved off of their Covid floor.  Patient is nonverbal at baseline (SP prior CVA).  Patient with valid DNR.  Patient with reported increased shortness of breath this evening.  EMS noted temperature of 102.  Increased O2 requirement reported per EMS  Level 5 caveat secondary to patient's nonverbal status.  The history is provided by the patient, medical records and the EMS personnel.  Illness Location:  Recent Covid infection, fever, shortness of breath Severity:  Moderate Onset quality:  Gradual Duration:  1 day Timing:  Constant Progression:  Worsening Chronicity:  New      Past Medical History:  Diagnosis Date  . Cancer (HCC)    Cervicle  . Full dentures   . Hepatitis C    denies  . Hypertension   . Migraine   . Myocardial infarction (HCC)   . Stroke [facility]) 2015   Pt reports hx of CVA x3.  Last one in 2015  . Wears glasses   . Wears hearing aid in both ears     Patient Active Problem List   Diagnosis Date Noted  . Cardioembolic stroke (HCC) 12/14/2020  . Protein-calorie malnutrition, severe 12/12/2020  . Pneumonia of right lower lobe due to infectious organism   . Acute systolic CHF (congestive heart failure) (HCC) 12/09/2020  . Emphysema/COPD (HCC) 12/09/2020  . Aspiration pneumonia (HCC) 12/07/2020  . Elevated troponin 12/07/2020  . Closed fracture of right distal radius 09/07/2018  . Fall 09/08/2015  . UTI (lower urinary tract infection) 09/08/2015  . Dehydration 09/08/2015  . Sepsis (HCC) 09/08/2015   . Systolic heart failure (HCC) 09/08/2015  . Confusion 09/08/2015  . TIA (transient ischemic attack) 09/08/2015  . Acute on chronic respiratory failure (HCC) 09/24/2014  . Tracheostomy status (HCC) 09/22/2014  . Demand ischemia of myocardium: in setting of CVA 09/17/2014  . 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 Acute respiratory failure (HCC) 09/11/2014  . Hypokalemia 09/11/2014  . Altered mental status 09/11/2014  . Subarachnoid hemorrhage (HCC) 09/11/2014    Past Surgical History:  Procedure Laterality Date  . ABDOMINAL HYSTERECTOMY    . BUNIONECTOMY    . COLONOSCOPY    . MULTIPLE TOOTH EXTRACTIONS    . OPEN REDUCTION INTERNAL FIXATION (ORIF) DISTAL RADIAL FRACTURE Right 09/07/2018   Procedure: OPEN REDUCTION INTERNAL FIXATION (ORIF) DISTAL RADIAL FRACTURE;  Surgeon: Bradly Bienenstock, MD;  Location: [facility] OR;  Service: Orthopedics;  Laterality: Right;  . PEG PLACEMENT N/A 09/21/2014   Procedure: PERCUTANEOUS ENDOSCOPIC GASTROSTOMY (PEG) PLACEMENT (BED SIDE);  Surgeon: Violeta Gelinas, MD;  Location: [facility] ENDOSCOPY;  Service: General;  Laterality: N/A;  . RADIOLOGY WITH ANESTHESIA N/A 09/11/2014   Procedure: Sub Arachnoid Aneurysm Coiling -RADIOLOGY WITH ANESTHESIA ;  Surgeon: Lisbeth Renshaw, MD;  Location: [facility] OR;  Service: Radiology;  Laterality: N/A;     OB History   No obstetric history on file.  Family History  Problem Relation Age of Onset  . Alcohol abuse Father     Social History   Tobacco Use  . Smoking status: Former Smoker    Packs/day: 0.50    Types: Cigarettes    Quit date: 09/06/2015    Years since quitting: 5.4  . Smokeless tobacco: Never Used  Vaping Use  . Vaping Use: Former  Substance Use Topics  . Alcohol use: Not Currently  . Drug use: No    Home Medications Prior to Admission medications   Medication Sig Start  Date End Date Taking? Authorizing Provider  albuterol (PROVENTIL) (2.5 MG/3ML) 0.083% nebulizer solution Take 3 mLs (2.5 mg total) by nebulization every 4 (four) hours as needed for wheezing or shortness of breath. 12/24/20   Pahwani, Michell Heinrich, MD  arformoterol (BROVANA) 15 MCG/2ML NEBU Take 2 mLs (15 mcg total) by nebulization 2 (two) times daily. 12/24/20   Pahwani, Michell Heinrich, MD  aspirin 325 MG EC tablet Take 325 mg by mouth daily.    [provider]  budesonide (PULMICORT) 0.5 MG/2ML nebulizer solution Take 2 mLs (0.5 mg total) by nebulization 2 (two) times daily. 12/24/20   Pahwani, Michell Heinrich, MD  cholecalciferol (VITAMIN D) 1000 units tablet Take 1,000 Units by mouth daily.    [provider]  clonazePAM (KLONOPIN) 0.5 MG tablet Take 1 tablet (0.5 mg total) by mouth 2 (two) times daily. Patient not taking: No sig reported 09/11/15   Jonetta Osgood, MD  docusate sodium (COLACE) 100 MG capsule Take 1 capsule (100 mg total) by mouth 2 (two) times daily. Patient not taking: No sig reported 09/09/18   Gertie Fey Bonham, PA  EPINEPHrine 0.3 mg/0.3 mL IJ SOAJ injection Inject 0.3 mg into the muscle once as needed (for allergic reaction).    [provider]  feeding supplement, ENSURE ENLIVE, (ENSURE ENLIVE) LIQD Take 237 mLs by mouth daily. Patient not taking: Reported on 12/07/2020 09/11/15   Jonetta Osgood, MD  FLUoxetine (PROZAC) 20 MG capsule Take 80 mg by mouth daily.    [provider]  guaiFENesin (MUCINEX) 600 MG 12 hr tablet Take 1 tablet (600 mg total) by mouth 2 (two) times daily. 12/24/20   Pahwani, Michell Heinrich, MD  methocarbamol (ROBAXIN) 500 MG tablet Take 1 tablet (500 mg total) by mouth every 6 (six) hours as needed for muscle spasms. Patient not taking: No sig reported 09/09/18   Brynda Peon, PA  metoprolol succinate (TOPROL-XL) 25 MG 24 hr tablet Take 0.5 tablets (12.5 mg total) by mouth daily. 12/25/20   Pahwani, Michell Heinrich, MD   oxyCODONE-acetaminophen (PERCOCET) 5-325 MG tablet Take 1-2 tablets by mouth every 6 (six) hours as needed. Patient not taking: No sig reported 08/31/18   Charlesetta Shanks, MD  pantoprazole (PROTONIX) 40 MG tablet Take 1 tablet (40 mg total) by mouth at bedtime. Patient not taking: No sig reported 09/11/15   Jonetta Osgood, MD  rosuvastatin (CRESTOR) 40 MG tablet Take 20 mg by mouth daily. Patient not taking: No sig reported    [provider]  traMADol (ULTRAM) 50 MG tablet Take 100 mg by mouth 4 (four) times daily as needed for moderate pain. Patient not taking: No sig reported    [provider]  traZODone (DESYREL) 100 MG tablet Take 200 mg by mouth at bedtime.    [provider]  warfarin (COUMADIN) 5 MG tablet Take 1 tablet (5 mg total) by mouth daily at 4 PM. 12/25/20  Pahwani, Michell Heinrich, MD    Allergies    Bee venom and Capsicum (cayenne) [cayenne]  Review of Systems   Review of Systems  Unable to perform ROS: Patient nonverbal    Physical Exam Updated Vital Signs Pulse 78   Temp 100.3 F (37.9 C) (Rectal)   Resp (!) 24   SpO2 97%   Physical Exam Vitals and nursing note reviewed.  Constitutional:      General: She is not in acute distress.    Appearance: Normal appearance. She is well-developed and well-nourished.  HENT:     Head: Normocephalic and atraumatic.     Mouth/Throat:     Mouth: Oropharynx is clear and moist.  Eyes:     Extraocular Movements: EOM normal.     Conjunctiva/sclera: Conjunctivae normal.     Pupils: Pupils are equal, round, and reactive to light.  Cardiovascular:     Rate and Rhythm: Normal rate and regular rhythm.     Heart sounds: Normal heart sounds.  Pulmonary:     Effort: Pulmonary effort is normal. No respiratory distress.     Breath sounds: Normal breath sounds.  Abdominal:     General: There is no distension.     Palpations: Abdomen is soft.     Tenderness: There is no abdominal tenderness.   Musculoskeletal:        General: No deformity or edema. Normal range of motion.     Cervical back: Normal range of motion and neck supple.  Skin:    General: Skin is warm and dry.  Neurological:     General: No focal deficit present.     Mental Status: She is alert. Mental status is at baseline.     Comments: Non verbal   Psychiatric:        Mood and Affect: Mood and affect normal.     ED Results / Procedures / Treatments   Labs (all labs ordered are listed, but only abnormal results are displayed) Labs Reviewed  SARS CORONAVIRUS 2 BY RT PCR (HOSPITAL ORDER, [place] [facility]) - Abnormal; Notable for the following components:      Result Value   SARS Coronavirus 2 POSITIVE (*)    All other components within normal limits  COMPREHENSIVE METABOLIC PANEL - Abnormal; Notable for the following components:   CO2 20 (*)    Glucose, Bld 125 (*)    BUN 71 (*)    Creatinine, Ser 2.56 (*)    Albumin 3.2 (*)    Total Bilirubin 1.5 (*)    GFR, Estimated 20 (*)    Anion gap 21 (*)    All other components within normal limits  CBC WITH DIFFERENTIAL/PLATELET - Abnormal; Notable for the following components:   WBC 15.4 (*)    RBC 5.16 (*)    HCT 46.4 (*)    Platelets 442 (*)    Neutro Abs 11.6 (*)    Monocytes Absolute 1.7 (*)    Abs Immature Granulocytes 0.10 (*)    All other components within normal limits  D-DIMER, QUANTITATIVE (NOT AT [facility]) - Abnormal; Notable for the following components:   D-Dimer, Quant 0.63 (*)    All other components within normal limits  FIBRINOGEN - Abnormal; Notable for the following components:   Fibrinogen 617 (*)    All other components within normal limits  C-REACTIVE PROTEIN - Abnormal; Notable for the following components:   CRP 8.1 (*)    All other components within normal limits  I-STAT CHEM 8, ED - Abnormal; Notable for the following components:   Potassium 6.5 (*)    BUN 112 (*)    Creatinine, Ser 2.50 (*)    Glucose,  Bld 123 (*)    Calcium, Ion 0.99 (*)    All other components within normal limits  TROPONIN I (HIGH SENSITIVITY) - Abnormal; Notable for the following components:   Troponin I (High Sensitivity) 18 (*)    All other components within normal limits  CULTURE, BLOOD (ROUTINE X 2)  CULTURE, BLOOD (ROUTINE X 2)  URINE CULTURE  LACTIC ACID, PLASMA  PROTIME-INR  APTT  BRAIN NATRIURETIC PEPTIDE  PROCALCITONIN  LACTATE DEHYDROGENASE  FERRITIN  TRIGLYCERIDES  LACTIC ACID, PLASMA  URINALYSIS, ROUTINE W REFLEX MICROSCOPIC  BASIC METABOLIC PANEL  URINALYSIS, COMPLETE (UACMP) WITH MICROSCOPIC  SODIUM, URINE, RANDOM  CREATININE, URINE, RANDOM  UREA NITROGEN, URINE  TROPONIN I (HIGH SENSITIVITY)    EKG EKG Interpretation  Date/Time:  Tuesday January 28 2021 19:13:32 EST Ventricular Rate:  79 PR Interval:    QRS Duration: 128 QT Interval:  442 QTC Calculation: 507 R Axis:   73 Text Interpretation: Sinus rhythm Right atrial enlargement Right bundle branch block Confirmed by Dene Gentry [phone]) on 01/28/2021 7:17:58 PM   Radiology DG Chest Port 1 View  Result Date: 01/28/2021 CLINICAL DATA:  Questionable sepsis - evaluate for abnormality lethargy. Decreased responsiveness. EXAM: PORTABLE CHEST 1 VIEW COMPARISON:  Radiograph and CT 12/07/2020. FINDINGS: Emphysema with chronic hyperinflation. Improved bronchial thickening from prior exam. Stable heart size and mediastinal contours. Aortic atherosclerosis. Retrocardiac hiatal hernia. No acute airspace disease. No pneumothorax or large pleural effusion. No pulmonary edema. Remote left clavicle fracture. Bones are diffusely under mineralized. Patient is rotated. IMPRESSION: Emphysema with chronic hyperinflation. Improved bronchial thickening from prior exam. No acute airspace disease. Electronically Signed   By: Keith Rake M.D.   On: 01/28/2021 19:13    Procedures Procedures  CRITICAL CARE Performed by: Valarie Merino   Total  critical care time: 30 minutes  Critical care time was exclusive of separately billable procedures and treating other patients.  Critical care was necessary to treat or prevent imminent or life-threatening deterioration.  Critical care was time spent personally by me on the following activities: development of treatment plan with patient and/or surrogate as well as nursing, discussions with consultants, evaluation of patient's response to treatment, examination of patient, obtaining history from patient or surrogate, ordering and performing treatments and interventions, ordering and review of laboratory studies, ordering and review of radiographic studies, pulse oximetry and re-evaluation of patient's condition.   Medications Ordered in ED Medications  lactated ringers infusion (has no administration in time range)  lactated ringers bolus 500 mL (500 mLs Intravenous New Bag/Given 01/28/21 1908)  cefTRIAXone (ROCEPHIN) 2 g in sodium chloride 0.9 % 100 mL IVPB (2 g Intravenous New Bag/Given 01/28/21 1928)  azithromycin (ZITHROMAX) 500 mg in sodium chloride 0.9 % 250 mL IVPB (has no administration in time range)  acetaminophen (TYLENOL) suppository 650 mg (has no administration in time range)    ED Course  I have reviewed the triage vital signs and the nursing notes.  Pertinent labs & imaging results that were available during my care of the patient were reviewed by me and considered in my medical decision making (see chart for details).    MDM Rules/Calculators/A&P  MDM  Screen complete  Mindy Mills was evaluated in Emergency Department on 01/28/2021 for the symptoms described in the history of present illness. She was evaluated in the context of the global COVID-19 pandemic, which necessitated consideration that the patient might be at risk for infection with the SARS-CoV-2 virus that causes COVID-19. Institutional protocols and algorithms that pertain to the  evaluation of patients at risk for COVID-19 are in a state of rapid change based on information released by regulatory bodies including the CDC and federal and state organizations. These policies and algorithms were followed during the patient's care in the ED.  Patient is presenting from her facility for evaluation of increasing weakness in the setting of recent Covid diagnosis.  Patient was apparently moved from Covid isolation room within the last day or 2.  He was sent today to the ED for evaluation given reported weakness and possible increased work of breathing.  Patient noted to be febrile with EMS.  Patient with dehydration noted on exam.  Labs support dx of dehydration with elevated creatinine and BUN.  Abx initiated in the ED.   CXR without clear infiltrates.   Patient will require admission for further workup and treatment. Hospitalist service is aware of case.   Final Clinical Impression(s) / ED Diagnoses Final diagnoses:  Fever, unspecified fever cause    Rx / DC Orders ED Discharge Orders    None       Valarie Merino, MD 01/28/21 2134

## 2021-01-28 NOTE — Sepsis Progress Note (Signed)
Sepsis protocol being followed by eLink 

## 2021-01-28 NOTE — ED Triage Notes (Signed)
Patient from [place] with c/o lethargy and decreased responsiveness while working with therapy. EMS reports oxygen sats on arrival 91% on basline 3l/min and neb at facility. Patient arrives on non rebreather at 97%. Nonverbal at baseline.

## 2021-01-29 DIAGNOSIS — I5042 Chronic combined systolic (congestive) and diastolic (congestive) heart failure: Secondary | ICD-10-CM

## 2021-01-29 DIAGNOSIS — J9611 Chronic respiratory failure with hypoxia: Secondary | ICD-10-CM

## 2021-01-29 LAB — COMPREHENSIVE METABOLIC PANEL
ALT: 16 U/L (ref 0–44)
AST: 19 U/L (ref 15–41)
Albumin: 2.7 g/dL — ABNORMAL LOW (ref 3.5–5.0)
Alkaline Phosphatase: 96 U/L (ref 38–126)
Anion gap: 18 — ABNORMAL HIGH (ref 5–15)
BUN: 67 mg/dL — ABNORMAL HIGH (ref 8–23)
CO2: 22 mmol/L (ref 22–32)
Calcium: 8.6 mg/dL — ABNORMAL LOW (ref 8.9–10.3)
Chloride: 103 mmol/L (ref 98–111)
Creatinine, Ser: 2.3 mg/dL — ABNORMAL HIGH (ref 0.44–1.00)
GFR, Estimated: 23 mL/min — ABNORMAL LOW (ref >60)
Glucose, Bld: 95 mg/dL (ref 70–99)
Potassium: 3.5 mmol/L (ref 3.5–5.1)
Sodium: 143 mmol/L (ref 135–145)
Total Bilirubin: 1.2 mg/dL (ref 0.3–1.2)
Total Protein: 6.8 g/dL (ref 6.5–8.1)

## 2021-01-29 LAB — D-DIMER, QUANTITATIVE: D-Dimer, Quant: 0.54 ug/mL-FEU — ABNORMAL HIGH (ref 0.00–0.50)

## 2021-01-29 LAB — CBC WITH DIFFERENTIAL/PLATELET
Abs Immature Granulocytes: 0.13 10*3/uL — ABNORMAL HIGH (ref 0.00–0.07)
Basophils Absolute: 0 10*3/uL (ref 0.0–0.1)
Basophils Relative: 0 %
Eosinophils Absolute: 0 10*3/uL (ref 0.0–0.5)
Eosinophils Relative: 0 %
HCT: 39.3 % (ref 36.0–46.0)
Hemoglobin: 12.6 g/dL (ref 12.0–15.0)
Immature Granulocytes: 1 %
Lymphocytes Relative: 9 %
Lymphs Abs: 1.7 10*3/uL (ref 0.7–4.0)
MCH: 29.2 pg (ref 26.0–34.0)
MCHC: 32.1 g/dL (ref 30.0–36.0)
MCV: 91.2 fL (ref 80.0–100.0)
Monocytes Absolute: 1.4 10*3/uL — ABNORMAL HIGH (ref 0.1–1.0)
Monocytes Relative: 7 %
Neutro Abs: 17.1 10*3/uL — ABNORMAL HIGH (ref 1.7–7.7)
Neutrophils Relative %: 83 %
Platelets: 341 10*3/uL (ref 150–400)
RBC: 4.31 MIL/uL (ref 3.87–5.11)
RDW: 13.4 % (ref 11.5–15.5)
WBC: 20.3 10*3/uL — ABNORMAL HIGH (ref 4.0–10.5)
nRBC: 0 % (ref 0.0–0.2)

## 2021-01-29 LAB — URINALYSIS, ROUTINE W REFLEX MICROSCOPIC
Bilirubin Urine: NEGATIVE
Glucose, UA: NEGATIVE mg/dL
Hgb urine dipstick: NEGATIVE
Ketones, ur: 5 mg/dL — AB
Nitrite: NEGATIVE
Protein, ur: NEGATIVE mg/dL
Specific Gravity, Urine: 1.024 (ref 1.005–1.030)
pH: 5 (ref 5.0–8.0)

## 2021-01-29 LAB — POTASSIUM
Potassium: 3.2 mmol/L — ABNORMAL LOW (ref 3.5–5.1)
Potassium: 2.8 mmol/L — ABNORMAL LOW (ref 3.5–5.1)
Potassium: 2.9 mmol/L — ABNORMAL LOW (ref 3.5–5.1)
Potassium: 3 mmol/L — ABNORMAL LOW (ref 3.5–5.1)

## 2021-01-29 LAB — PROTIME-INR
INR: 1.3 — ABNORMAL HIGH (ref 0.8–1.2)
Prothrombin Time: 15.4 seconds — ABNORMAL HIGH (ref 11.4–15.2)

## 2021-01-29 LAB — C-REACTIVE PROTEIN: CRP: 5.2 mg/dL — ABNORMAL HIGH (ref <1.0)

## 2021-01-29 LAB — MAGNESIUM: Magnesium: 2.1 mg/dL (ref 1.7–2.4)

## 2021-01-29 MED ORDER — WARFARIN SODIUM 5 MG PO TABS
5.0000 mg | ORAL_TABLET | Freq: Once | ORAL | Status: AC
  Administered 2021-01-29: 5 mg via ORAL
  Filled 2021-01-29: qty 1

## 2021-01-29 MED ORDER — POTASSIUM CHLORIDE CRYS ER 20 MEQ PO TBCR
30.0000 meq | EXTENDED_RELEASE_TABLET | Freq: Once | ORAL | Status: AC
  Administered 2021-01-29: 30 meq via ORAL
  Filled 2021-01-29: qty 1

## 2021-01-29 MED ORDER — WARFARIN SODIUM 6 MG PO TABS
6.0000 mg | ORAL_TABLET | Freq: Once | ORAL | Status: AC
  Administered 2021-01-29: 6 mg via ORAL
  Filled 2021-01-29 (×2): qty 1

## 2021-01-29 MED ORDER — WARFARIN - PHARMACIST DOSING INPATIENT: Freq: Every day | Status: DC

## 2021-01-29 NOTE — Sepsis Progress Note (Signed)
Sepsis monitoring complete. Received rocephin and zithromax.

## 2021-01-29 NOTE — Progress Notes (Signed)
PROGRESS NOTE  Mindy Mills XBJ:[id number] DOB: [birth year] DOA: 01/28/2021 PCP: Patient, No Pcp Per   LOS: 1 day   Brief Narrative / Interim history: 70 year old female with remote hemorrhagic CVA, CKD 3B, chronic combined CHF, COPD with chronic hypoxia, LV mural thrombus with cardioembolic CVA in December 3086 now on Coumadin, dysphagia comes into the hospital from SNF due to lethargy and decreased responsiveness.  She was diagnosed with Covid about a week ago and she was noted to have worsening lethargy, fatigue.  At baseline she does not have comprehensible speech since most recent CVA and requires assistance with all ADLs, takes thickened liquids  Subjective / 24h Interval events: She is alert, speech is unintelligible  Assessment & Plan: Principal Problem Acute kidney injury on chronic kidney disease stage IIIb, hyperkalemia -Patient presents from SNF where she has been more lethargic in the last few days since Covid diagnosis, poor p.o. intake.  Baseline creatinine is about 1.3, during admission was 2.5 but gradually improving.  Continue fluids.  Has known CHF -Received Lokelma now hypokalemic  Active Problems COVID-19-reportedly diagnosed about a week ago at the SNF, chest x-ray is clear and she is at baseline respiratory status on chronic 3 L  COPD, chronic hypoxia-stable, no wheezing  LV thrombus, history of cardioembolic CVA - continue Coumadin  Chronic combined CHF-echo from December 2021 showed an EF of 25-30%.  Gentle fluids as above  SIRS-tachypneic, WBC 15,000, on admission, slightly worsened 20 K today.  She is being monitored off antibiotics.  She is afebrile.  Chest x-ray clear.  Send urinalysis   Scheduled Meds: . Ipratropium-Albuterol  1 puff Inhalation Q6H  . potassium chloride  30 mEq Oral Once  . warfarin  6 mg Oral Once  . Warfarin - Pharmacist Dosing Inpatient   Does not apply q1600   Continuous Infusions: . lactated ringers 75 mL/hr at 01/28/21 2355    PRN Meds:.acetaminophen, ondansetron **OR** ondansetron (ZOFRAN) IV, senna-docusate  Diet Orders (From admission, onward)    Start     Ordered   01/28/21 2320  DIET - DYS 1 Room service appropriate? Yes; Fluid consistency: Honey Thick  Diet effective now       Question Answer Comment  Room service appropriate? Yes   Fluid consistency: Honey Thick      01/28/21 2322          DVT prophylaxis:  warfarin (COUMADIN) tablet 6 mg     Code Status: DNR  Family Communication: No family present  Status is: Inpatient  Remains inpatient appropriate because:Inpatient level of care appropriate due to severity of illness   Dispo: The patient is from: SNF              Anticipated d/c is to: SNF              Anticipated d/c date is: 2 days              Patient currently is not medically stable to d/c.   Difficult to place patient No  Level of care: Telemetry Medical  Consultants:  None   Procedures:  None   Microbiology  None   Antimicrobials: None     Objective: Vitals:   01/29/21 0400 01/29/21 0445 01/29/21 0700 01/29/21 1100  BP: 117/71  (!) 122/54 94/65  Pulse: 64  61   Resp: 15  13 18   Temp: 98.9 F (37.2 C)  98 F (36.7 C) 98.2 F (36.8 C)  TempSrc: Axillary  Axillary Axillary  SpO2: 96%     Weight:  37.9 kg      Intake/Output Summary (Last 24 hours) at 01/29/2021 1111 Last data filed at 01/29/2021 0933 Gross per 24 hour  Intake 1394.1 ml  Output 200 ml  Net 1194.1 ml   Filed Weights   01/29/21 0000 01/29/21 0445  Weight: 38 kg 37.9 kg    Examination:  Constitutional: NAD Eyes: no scleral icterus ENMT: Mucous membranes are moist.  Neck: normal, supple Respiratory: clear to auscultation bilaterally, no wheezing, no crackles. Normal respiratory effort. No accessory muscle use.  Cardiovascular: Regular rate and rhythm, no murmurs / rubs / gallops. No LE edema.  Abdomen: non distended, no tenderness. Bowel sounds positive.  Musculoskeletal: no  clubbing / cyanosis.  Skin: no rashes Neurologic: Right hemiparesis  Data Reviewed: I have independently reviewed following labs and imaging studies   CBC: Recent Labs  Lab 01/28/21 1842 01/28/21 1947 01/29/21 0058  WBC 15.4*  --  20.3*  NEUTROABS 11.6*  --  17.1*  HGB 14.5 14.6 12.6  HCT 46.4* 43.0 39.3  MCV 89.9  --  91.2  PLT 442*  --  [id number]   Basic Metabolic Panel: Recent Labs  Lab 01/28/21 1842 01/28/21 1947 01/28/21 2125 01/29/21 0058 01/29/21 0736  NA 141 139 140 143  --   K 3.9 6.5* 5.9* 3.5 3.2*  CL 100 104 102 103  --   CO2 20*  --  24 22  --   GLUCOSE 125* 123* 102* 95  --   BUN 71* 112* 65* 67*  --   CREATININE 2.56* 2.50* 2.42* 2.30*  --   CALCIUM 9.2  --  8.4* 8.6*  --   MG  --   --   --  2.1  --    Liver Function Tests: Recent Labs  Lab 01/28/21 1842 01/29/21 0058  AST 21 19  ALT 19 16  ALKPHOS 120 96  BILITOT 1.5* 1.2  PROT 7.9 6.8  ALBUMIN 3.2* 2.7*   Coagulation Profile: Recent Labs  Lab 01/28/21 1842 01/29/21 0058  INR 1.2 1.3*   HbA1C: No results for input(s): HGBA1C in the last 72 hours. CBG: No results for input(s): GLUCAP in the last 168 hours.  Recent Results (from the past 240 hour(s))  SARS Coronavirus 2 by RT PCR (hospital order, performed in [facility] hospital lab) Nasopharyngeal Nasopharyngeal Swab     Status: Abnormal   Collection Time: 01/28/21  6:42 PM   Specimen: Nasopharyngeal Swab  Result Value Ref Range Status   SARS Coronavirus 2 POSITIVE (A) NEGATIVE Final    Comment: RESULT CALLED TO, READ BACK BY AND VERIFIED WITH: RN MOLLY  C 2036 [id number] FCP (NOTE) SARS-CoV-2 target nucleic acids are DETECTED  SARS-CoV-2 RNA is generally detectable in upper respiratory specimens  during the acute phase of infection.  Positive results are indicative  of the presence of the identified virus, but do not rule out bacterial infection or co-infection with other pathogens not detected by the test.  Clinical correlation with  patient history and  other diagnostic information is necessary to determine patient infection status.  The expected result is negative.  Fact Sheet for Patients:   StrictlyIdeas.no   Fact Sheet for Healthcare Providers:   BankingDealers.co.za    This test is not yet approved or cleared by the [place] FDA and  has been authorized for detection and/or diagnosis of SARS-CoV-2 by FDA under an Emergency Use Authorization (EUA).  This EUA will remain  in effect (meaning this test can  be used) for the duration of  the COVID-19 declaration under Section 564(b)(1) of the Act, 21 U.S.C. section 360-bbb-3(b)(1), unless the authorization is terminated or revoked sooner.  Performed at [facility], [place] [address] [place], [place] [address]   Blood Culture (routine x 2)     Status: None (Preliminary result)   Collection Time: 01/28/21  7:00 PM   Specimen: BLOOD  Result Value Ref Range Status   Specimen Description BLOOD SITE NOT SPECIFIED  Final   Special Requests   Final    BOTTLES DRAWN AEROBIC AND ANAEROBIC Blood Culture results may not be optimal due to an inadequate volume of blood received in culture bottles   Culture   Final    NO GROWTH < 12 HOURS Performed at [facility], [place] [address] [place], [place] [address]    Report Status PENDING  Incomplete  Blood Culture (routine x 2)     Status: None (Preliminary result)   Collection Time: 01/28/21  7:10 PM   Specimen: BLOOD  Result Value Ref Range Status   Specimen Description BLOOD SITE NOT SPECIFIED  Final   Special Requests   Final    BOTTLES DRAWN AEROBIC AND ANAEROBIC Blood Culture results may not be optimal due to an inadequate volume of blood received in culture bottles   Culture   Final    NO GROWTH < 12 HOURS Performed at [facility], [place] [address], [place],  [address]    Report Status PENDING  Incomplete     Radiology Studies: DG Chest  Port 1 View  Result Date: 01/28/2021 CLINICAL DATA:  Questionable sepsis - evaluate for abnormality lethargy. Decreased responsiveness. EXAM: PORTABLE CHEST 1 VIEW COMPARISON:  Radiograph and CT 12/07/2020. FINDINGS: Emphysema with chronic hyperinflation. Improved bronchial thickening from prior exam. Stable heart size and mediastinal contours. Aortic atherosclerosis. Retrocardiac hiatal hernia. No acute airspace disease. No pneumothorax or large pleural effusion. No pulmonary edema. Remote left clavicle fracture. Bones are diffusely under mineralized. Patient is rotated. IMPRESSION: Emphysema with chronic hyperinflation. Improved bronchial thickening from prior exam. No acute airspace disease. Electronically Signed   By: Keith Rake M.D.   On: 01/28/2021 19:13   Marzetta Board, MD, PhD [facility]  Between 7 am - 7 pm I am available, please contact me via Amion or Securechat  Between 7 pm - 7 am I am not available, please contact night coverage MD/APP via Amion

## 2021-01-29 NOTE — Plan of Care (Signed)
  Problem: Education: Goal: Knowledge of risk factors and measures for prevention of condition will improve Outcome: Progressing   Problem: Coping: Goal: Psychosocial and spiritual needs will be supported Outcome: Progressing   Problem: Respiratory: Goal: Will maintain a patent airway Outcome: Progressing Goal: Complications related to the disease process, condition or treatment will be avoided or minimized Outcome: Progressing   

## 2021-01-29 NOTE — Progress Notes (Addendum)
ANTICOAGULATION CONSULT NOTE - Follow Up Consult  Pharmacy Consult for Warfarin Indication: LV thrombus  Allergies  Allergen Reactions  . Bee Venom Anaphylaxis and Hives  . Capsicum (Cayenne) [Cayenne] Swelling    Patient Measurements: Weight: 37.9 kg (83 lb 8.9 oz)   Vital Signs: Temp: 98 F (36.7 C) (02/02 0700) Temp Source: Axillary (02/02 0700) BP: 122/54 (02/02 0700) Pulse Rate: 61 (02/02 0700)  Labs: Recent Labs    01/28/21 1842 01/28/21 1947 01/28/21 2125 01/29/21 0058  HGB 14.5 14.6  --  12.6  HCT 46.4* 43.0  --  39.3  PLT 442*  --   --  341  APTT 33  --   --   --   LABPROT 14.5  --   --  15.4*  INR 1.2  --   --  1.3*  CREATININE 2.56* 2.50* 2.42* 2.30*  TROPONINIHS 18*  --  16  --     Estimated Creatinine Clearance: 14 mL/min (A) (by C-G formula based on SCr of 2.3 mg/dL (H)).  Assessment: Anticoag: H/O LV thrombus/CVA, CBC WNL, Ddimer 0.54. INR 1.3. - Coumadin 5 mg daily at NH, INR 1.2 on admit  Goal of Therapy:  INR 2-3 Monitor platelets by anticoagulation protocol: Yes   Plan:  Warfarin 6mg  po x 1 tonight. Dose later tonight and f/u charting. Daily INR   Tekeyah Santiago S. Alford Highland, PharmD, BCPS Clinical Staff Pharmacist [email or web address] Alford Highland, [employer] 01/29/2021,9:29 AM

## 2021-01-29 NOTE — Progress Notes (Signed)
CSW notified that patient has been at [facility]. Per [facility], they are able to accept patient back if she chooses to return.   Amram Maya LCSW

## 2021-01-29 NOTE — Progress Notes (Signed)
ANTICOAGULATION CONSULT NOTE - Initial Consult  Pharmacy Consult for Coumadin Indication: LV thrombus  Allergies  Allergen Reactions  . Bee Venom Anaphylaxis and Hives  . Capsicum (Cayenne) [Cayenne] Swelling    Patient Measurements: Weight: 38 kg (83 lb 12.4 oz)  Vital Signs: Temp: 98.9 F (37.2 C) (02/02 0000) Temp Source: Axillary (02/02 0000) BP: 107/73 (02/02 0000) Pulse Rate: 69 (02/02 0000)  Labs: Recent Labs    01/28/21 1842 01/28/21 1947 01/28/21 2125  HGB 14.5 14.6  --   HCT 46.4* 43.0  --   PLT 442*  --   --   APTT 33  --   --   LABPROT 14.5  --   --   INR 1.2  --   --   CREATININE 2.56* 2.50* 2.42*  TROPONINIHS 18*  --  16    Estimated Creatinine Clearance: 13.3 mL/min (A) (by C-G formula based on SCr of 2.42 mg/dL (H)).   Medical History: Past Medical History:  Diagnosis Date  . Cancer ([place])    Cervicle  . Full dentures   . Hepatitis C    denies  . Hypertension   . Migraine   . Myocardial infarction ([place])   . Stroke [facility]) 2015   Pt reports hx of CVA x3.  Last one in 2015  . Wears glasses   . Wears hearing aid in both ears     Medications:  Medications Prior to Admission  Medication Sig Dispense Refill Last Dose  . albuterol (PROVENTIL) (2.5 MG/3ML) 0.083% nebulizer solution Take 3 mLs (2.5 mg total) by nebulization every 4 (four) hours as needed for wheezing or shortness of breath. 75 mL 12   . arformoterol (BROVANA) 15 MCG/2ML NEBU Take 2 mLs (15 mcg total) by nebulization 2 (two) times daily. 120 mL 0   . aspirin 325 MG EC tablet Take 325 mg by mouth daily.     . budesonide (PULMICORT) 0.5 MG/2ML nebulizer solution Take 2 mLs (0.5 mg total) by nebulization 2 (two) times daily. 100 mL 0   . cholecalciferol (VITAMIN D) 1000 units tablet Take 1,000 Units by mouth daily.     . clonazePAM (KLONOPIN) 0.5 MG tablet Take 1 tablet (0.5 mg total) by mouth 2 (two) times daily. (Patient not taking: No sig reported) 20 tablet 0   . docusate sodium  (COLACE) 100 MG capsule Take 1 capsule (100 mg total) by mouth 2 (two) times daily. (Patient not taking: No sig reported) 10 capsule 0   . EPINEPHrine 0.3 mg/0.3 mL IJ SOAJ injection Inject 0.3 mg into the muscle once as needed (for allergic reaction).     . feeding supplement, ENSURE ENLIVE, (ENSURE ENLIVE) LIQD Take 237 mLs by mouth daily. (Patient not taking: Reported on 12/07/2020)     . FLUoxetine (PROZAC) 20 MG capsule Take 80 mg by mouth daily.     Marland Kitchen guaiFENesin (MUCINEX) 600 MG 12 hr tablet Take 1 tablet (600 mg total) by mouth 2 (two) times daily. 30 tablet 0   . methocarbamol (ROBAXIN) 500 MG tablet Take 1 tablet (500 mg total) by mouth every 6 (six) hours as needed for muscle spasms. (Patient not taking: No sig reported) 60 tablet 0   . metoprolol succinate (TOPROL-XL) 25 MG 24 hr tablet Take 0.5 tablets (12.5 mg total) by mouth daily. 30 tablet 0   . oxyCODONE-acetaminophen (PERCOCET) 5-325 MG tablet Take 1-2 tablets by mouth every 6 (six) hours as needed. (Patient not taking: No sig reported) 20 tablet  0   . pantoprazole (PROTONIX) 40 MG tablet Take 1 tablet (40 mg total) by mouth at bedtime. (Patient not taking: No sig reported)     . rosuvastatin (CRESTOR) 40 MG tablet Take 20 mg by mouth daily. (Patient not taking: No sig reported)     . traMADol (ULTRAM) 50 MG tablet Take 100 mg by mouth 4 (four) times daily as needed for moderate pain. (Patient not taking: No sig reported)     . traZODone (DESYREL) 100 MG tablet Take 200 mg by mouth at bedtime.     Marland Kitchen warfarin (COUMADIN) 5 MG tablet Take 1 tablet (5 mg total) by mouth daily at 4 PM. 30 tablet 2     Assessment: 70 y.o. female admitted with AKI, h/o LV thrombus, to continue Coumadin  INR subtherapeutic on admit   Goal of Therapy:  INR 2-3 Monitor platelets by anticoagulation protocol: Yes   Plan:  Coumadin 5 mg now Daily INR  Caryl Pina 01/29/2021,12:21 AM

## 2021-01-29 NOTE — Progress Notes (Signed)
Pt admitted from ED alert and non verbal. VSS skin  Intact. Unable to do admission pt unable to answer question. Call bell within reach bed in low locked position. Will continue to monitor.

## 2021-01-30 ENCOUNTER — Inpatient Hospital Stay (HOSPITAL_COMMUNITY): Payer: Medicare HMO

## 2021-01-30 DIAGNOSIS — N1832 Chronic kidney disease, stage 3b: Secondary | ICD-10-CM

## 2021-01-30 DIAGNOSIS — N179 Acute kidney failure, unspecified: Principal | ICD-10-CM

## 2021-01-30 DIAGNOSIS — J9621 Acute and chronic respiratory failure with hypoxia: Secondary | ICD-10-CM

## 2021-01-30 LAB — CBC WITH DIFFERENTIAL/PLATELET
Abs Immature Granulocytes: 0.11 10*3/uL — ABNORMAL HIGH (ref 0.00–0.07)
Basophils Absolute: 0 10*3/uL (ref 0.0–0.1)
Basophils Relative: 0 %
Eosinophils Absolute: 0 10*3/uL (ref 0.0–0.5)
Eosinophils Relative: 0 %
HCT: 39.8 % (ref 36.0–46.0)
Hemoglobin: 12.7 g/dL (ref 12.0–15.0)
Immature Granulocytes: 1 %
Lymphocytes Relative: 5 %
Lymphs Abs: 1.1 10*3/uL (ref 0.7–4.0)
MCH: 28.4 pg (ref 26.0–34.0)
MCHC: 31.9 g/dL (ref 30.0–36.0)
MCV: 89 fL (ref 80.0–100.0)
Monocytes Absolute: 1.3 10*3/uL — ABNORMAL HIGH (ref 0.1–1.0)
Monocytes Relative: 6 %
Neutro Abs: 18.6 10*3/uL — ABNORMAL HIGH (ref 1.7–7.7)
Neutrophils Relative %: 88 %
Platelets: 291 10*3/uL (ref 150–400)
RBC: 4.47 MIL/uL (ref 3.87–5.11)
RDW: 13.2 % (ref 11.5–15.5)
WBC: 21.2 10*3/uL — ABNORMAL HIGH (ref 4.0–10.5)
nRBC: 0 % (ref 0.0–0.2)

## 2021-01-30 LAB — COMPREHENSIVE METABOLIC PANEL
ALT: 14 U/L (ref 0–44)
AST: 20 U/L (ref 15–41)
Albumin: 2.7 g/dL — ABNORMAL LOW (ref 3.5–5.0)
Alkaline Phosphatase: 87 U/L (ref 38–126)
Anion gap: 12 (ref 5–15)
BUN: 38 mg/dL — ABNORMAL HIGH (ref 8–23)
CO2: 26 mmol/L (ref 22–32)
Calcium: 8.9 mg/dL (ref 8.9–10.3)
Chloride: 105 mmol/L (ref 98–111)
Creatinine, Ser: 1.06 mg/dL — ABNORMAL HIGH (ref 0.44–1.00)
GFR, Estimated: 57 mL/min — ABNORMAL LOW (ref >60)
Glucose, Bld: 100 mg/dL — ABNORMAL HIGH (ref 70–99)
Potassium: 3.1 mmol/L — ABNORMAL LOW (ref 3.5–5.1)
Sodium: 143 mmol/L (ref 135–145)
Total Bilirubin: 0.6 mg/dL (ref 0.3–1.2)
Total Protein: 6.7 g/dL (ref 6.5–8.1)

## 2021-01-30 LAB — POTASSIUM
Potassium: 2.9 mmol/L — ABNORMAL LOW (ref 3.5–5.1)
Potassium: 3.2 mmol/L — ABNORMAL LOW (ref 3.5–5.1)

## 2021-01-30 LAB — PROTIME-INR
INR: 1.7 — ABNORMAL HIGH (ref 0.8–1.2)
Prothrombin Time: 19.2 seconds — ABNORMAL HIGH (ref 11.4–15.2)

## 2021-01-30 LAB — D-DIMER, QUANTITATIVE: D-Dimer, Quant: 1.18 ug/mL-FEU — ABNORMAL HIGH (ref 0.00–0.50)

## 2021-01-30 LAB — C-REACTIVE PROTEIN: CRP: 4.8 mg/dL — ABNORMAL HIGH (ref <1.0)

## 2021-01-30 MED ORDER — CLONAZEPAM 0.5 MG PO TABS
0.5000 mg | ORAL_TABLET | Freq: Two times a day (BID) | ORAL | Status: DC
  Administered 2021-01-31: 0.5 mg via ORAL
  Filled 2021-01-30: qty 1

## 2021-01-30 MED ORDER — FOSFOMYCIN TROMETHAMINE 3 G PO PACK
3.0000 g | PACK | Freq: Once | ORAL | Status: AC
  Administered 2021-01-30: 3 g via ORAL
  Filled 2021-01-30: qty 3

## 2021-01-30 MED ORDER — PANTOPRAZOLE SODIUM 40 MG PO TBEC
40.0000 mg | DELAYED_RELEASE_TABLET | Freq: Every day | ORAL | Status: DC
  Filled 2021-01-30: qty 1

## 2021-01-30 MED ORDER — ROSUVASTATIN CALCIUM 20 MG PO TABS
20.0000 mg | ORAL_TABLET | Freq: Every day | ORAL | Status: DC
  Administered 2021-01-31: 20 mg via ORAL
  Filled 2021-01-30: qty 1

## 2021-01-30 MED ORDER — FLUOXETINE HCL 20 MG PO CAPS
80.0000 mg | ORAL_CAPSULE | Freq: Every day | ORAL | Status: DC
  Administered 2021-01-31: 80 mg via ORAL
  Filled 2021-01-30: qty 4

## 2021-01-30 MED ORDER — METOPROLOL SUCCINATE ER 25 MG PO TB24
12.5000 mg | ORAL_TABLET | Freq: Every day | ORAL | Status: DC
Start: 2021-01-31 — End: 2021-01-31
  Administered 2021-01-31: 12.5 mg via ORAL
  Filled 2021-01-30: qty 1

## 2021-01-30 MED ORDER — ASCORBIC ACID 500 MG PO TABS
500.0000 mg | ORAL_TABLET | Freq: Every day | ORAL | Status: DC
  Administered 2021-01-31: 500 mg via ORAL
  Filled 2021-01-30: qty 1

## 2021-01-30 MED ORDER — WARFARIN SODIUM 6 MG PO TABS
6.0000 mg | ORAL_TABLET | Freq: Once | ORAL | Status: AC
  Administered 2021-01-30: 6 mg via ORAL
  Filled 2021-01-30: qty 1

## 2021-01-30 MED ORDER — POTASSIUM CHLORIDE CRYS ER 20 MEQ PO TBCR
40.0000 meq | EXTENDED_RELEASE_TABLET | ORAL | Status: AC
  Administered 2021-01-30 (×2): 40 meq via ORAL
  Filled 2021-01-30 (×2): qty 2

## 2021-01-30 MED ORDER — DOCUSATE SODIUM 100 MG PO CAPS
100.0000 mg | ORAL_CAPSULE | Freq: Two times a day (BID) | ORAL | Status: DC
  Administered 2021-01-31: 100 mg via ORAL
  Filled 2021-01-30: qty 1

## 2021-01-30 MED ORDER — ASPIRIN EC 325 MG PO TBEC
325.0000 mg | DELAYED_RELEASE_TABLET | Freq: Every day | ORAL | Status: DC
  Administered 2021-01-31: 325 mg via ORAL
  Filled 2021-01-30: qty 1

## 2021-01-30 MED ORDER — TRAZODONE HCL 50 MG PO TABS
100.0000 mg | ORAL_TABLET | Freq: Every day | ORAL | Status: DC
  Filled 2021-01-30: qty 2

## 2021-01-30 NOTE — Progress Notes (Signed)
Floor coverage overnight event  Notified by RN that the patient had an unwitnessed fall.  It is reported that her bed alarm was off and that she was found on the floor by a nurse tech.  Per RN, patient is awake but nonverbal at baseline.  When RN asked her if she had pain anywhere she shook her head to say no.  No signs of distress and no obvious injuries reported.  No change in vital signs reported.  -Patient is chronically on Coumadin for anticoagulation due to history of LV thrombus/cardioembolic stroke.  Will order stat head CT to assess for intracranial bleed in the setting of unwitnessed fall/possible head trauma. -PT/OT eval, fall precautions

## 2021-01-30 NOTE — Progress Notes (Signed)
PROGRESS NOTE  Mindy Mills [id number] DOB: 1951/01/25 DOA: 01/28/2021 PCP: Patient, No Pcp Per   LOS: 2 days   Brief Narrative / Interim history: 70 year old female with remote hemorrhagic CVA, CKD 3B, chronic combined CHF, COPD with chronic hypoxia, LV mural thrombus with cardioembolic CVA in December [id number] now on Coumadin, dysphagia comes into the hospital from SNF due to lethargy and decreased responsiveness.  She was diagnosed with Covid about a week ago and she was noted to have worsening lethargy, fatigue.  At baseline she does not have comprehensible speech since most recent CVA and requires assistance with all ADLs, takes thickened liquids  Subjective / 24h Interval events: Alert, speech is unintelligible, seems a bit frustrated that she cannot express herself  Assessment & Plan: Principal Problem Acute kidney injury on chronic kidney disease stage IIIb, hyperkalemia -Patient presents from SNF where she has been more lethargic in the last few days since Covid diagnosis, poor p.o. intake.  Baseline creatinine is about 1.3, during admission was 2.5 but gradually improving.  Creatinine has normalized this morning at 1.0, stop fluids -Received Lokelma, potassium normalized and low now, replete  Active Problems COVID-19-reportedly diagnosed about a week ago at the SNF, chest x-ray is clear and she is at baseline respiratory status on chronic 3 L  COPD, chronic hypoxia-stable, no wheezing  LV thrombus, history of cardioembolic CVA - continue Coumadin.  Continue aspirin  Hyperlipidemia -Resume home statin  Depression/anxiety -Resume home medications  Chronic combined CHF-echo from December 2021 showed an EF of 25-30%.  No further fluids  SIRS-tachypneic, WBC 15,000, on admission, slightly worsened 21 K today.  UA seems a bit dirty, she is unable to verbalize his symptoms and given leukocytosis will give fosfomycin x1   Scheduled Meds: . Ipratropium-Albuterol  1 puff  Inhalation Q6H  . potassium chloride  40 mEq Oral Q3H  . warfarin  6 mg Oral ONCE-1600  . Warfarin - Pharmacist Dosing Inpatient   Does not apply q1600   Continuous Infusions:  PRN Meds:.acetaminophen, ondansetron **OR** ondansetron (ZOFRAN) IV, senna-docusate  Diet Orders (From admission, onward)    Start     Ordered   01/28/21 2320  DIET - DYS 1 Room service appropriate? Yes; Fluid consistency: Honey Thick  Diet effective now       Question Answer Comment  Room service appropriate? Yes   Fluid consistency: Honey Thick      01/28/21 2322          DVT prophylaxis:  warfarin (COUMADIN) tablet 6 mg     Code Status: DNR  Family Communication: No family present, discussed with daughter-in-law over the phone  Status is: Inpatient  Remains inpatient appropriate because:Inpatient level of care appropriate due to severity of illness   Dispo: The patient is from: SNF              Anticipated d/c is to: SNF              Anticipated d/c date is: 2 days              Patient currently is not medically stable to d/c.   Difficult to place patient No  Level of care: Telemetry Medical  Consultants:  None   Procedures:  None   Microbiology  None   Antimicrobials: None     Objective: Vitals:   01/30/21 0258 01/30/21 0458 01/30/21 0500 01/30/21 0721  BP: 126/89 104/67  (!) 94/57  Pulse: 81 86  83  Resp: 20  15  19  Temp: (!) 97.3 F (36.3 C) 97.8 F (36.6 C)  98.2 F (36.8 C)  TempSrc: Oral Axillary  Oral  SpO2: 91% 90%  93%  Weight:   38 kg     Intake/Output Summary (Last 24 hours) at 01/30/2021 1102 Last data filed at 01/29/2021 1313 Gross per 24 hour  Intake 120 ml  Output --  Net 120 ml   Filed Weights   01/29/21 0000 01/29/21 0445 01/30/21 0500  Weight: 38 kg 37.9 kg 38 kg    Examination:  Constitutional: NAD Eyes: No icterus ENMT: mmm Neck: normal, supple Respiratory: Clear bilaterally, no wheezing, no crackles, normal respiratory  effort Cardiovascular: Regular rate and rhythm, no murmurs, no edema Abdomen: Soft, NT, ND, bowel sounds positive Musculoskeletal: no clubbing / cyanosis.  Skin: No rashes seen Neurologic: Right hemiparesis, no new focal deficits  Data Reviewed: I have independently reviewed following labs and imaging studies   CBC: Recent Labs  Lab 01/28/21 1842 01/28/21 1947 01/29/21 0058 01/30/21 0547  WBC 15.4*  --  20.3* 21.2*  NEUTROABS 11.6*  --  17.1* 18.6*  HGB 14.5 14.6 12.6 12.7  HCT 46.4* 43.0 39.3 39.8  MCV 89.9  --  91.2 89.0  PLT 442*  --  341 [id number]   Basic Metabolic Panel: Recent Labs  Lab 01/28/21 1842 01/28/21 1947 01/28/21 2125 01/29/21 0058 01/29/21 0736 01/29/21 1449 01/29/21 1913 01/29/21 2257 01/30/21 0547 01/30/21 0947  NA 141 139 140 143  --   --   --   --  143  --   K 3.9 6.5* 5.9* 3.5   < > 2.9* 3.0* 2.9* 3.1* 3.2*  CL 100 104 102 103  --   --   --   --  105  --   CO2 20*  --  24 22  --   --   --   --  26  --   GLUCOSE 125* 123* 102* 95  --   --   --   --  100*  --   BUN 71* 112* 65* 67*  --   --   --   --  38*  --   CREATININE 2.56* 2.50* 2.42* 2.30*  --   --   --   --  1.06*  --   CALCIUM 9.2  --  8.4* 8.6*  --   --   --   --  8.9  --   MG  --   --   --  2.1  --   --   --   --   --   --    < > = values in this interval not displayed.   Liver Function Tests: Recent Labs  Lab 01/28/21 1842 01/29/21 0058 01/30/21 0547  AST 21 19 20   ALT 19 16 14   ALKPHOS 120 96 87  BILITOT 1.5* 1.2 0.6  PROT 7.9 6.8 6.7  ALBUMIN 3.2* 2.7* 2.7*   Coagulation Profile: Recent Labs  Lab 01/28/21 1842 01/29/21 0058 01/30/21 0547  INR 1.2 1.3* 1.7*   HbA1C: No results for input(s): HGBA1C in the last 72 hours. CBG: No results for input(s): GLUCAP in the last 168 hours.  Recent Results (from the past 240 hour(s))  SARS Coronavirus 2 by RT PCR (hospital order, performed in [facility] hospital lab) Nasopharyngeal Nasopharyngeal Swab     Status: Abnormal    Collection Time: 01/28/21  6:42 PM   Specimen: Nasopharyngeal Swab  Result Value  Ref Range Status   SARS Coronavirus 2 POSITIVE (A) NEGATIVE Final    Comment: RESULT CALLED TO, READ BACK BY AND VERIFIED WITH: RN MOLLY  C 2036 [id number] FCP (NOTE) SARS-CoV-2 target nucleic acids are DETECTED  SARS-CoV-2 RNA is generally detectable in upper respiratory specimens  during the acute phase of infection.  Positive results are indicative  of the presence of the identified virus, but do not rule out bacterial infection or co-infection with other pathogens not detected by the test.  Clinical correlation with patient history and  other diagnostic information is necessary to determine patient infection status.  The expected result is negative.  Fact Sheet for Patients:   StrictlyIdeas.no   Fact Sheet for Healthcare Providers:   BankingDealers.co.za    This test is not yet approved or cleared by the [place] FDA and  has been authorized for detection and/or diagnosis of SARS-CoV-2 by FDA under an Emergency Use Authorization (EUA).  This EUA will remain in effect (meaning this test can  be used) for the duration of  the COVID-19 declaration under Section 564(b)(1) of the Act, 21 U.S.C. section 360-bbb-3(b)(1), unless the authorization is terminated or revoked sooner.  Performed at [facility], [place] [address], [place], [place] [address]   Blood Culture (routine x 2)     Status: None (Preliminary result)   Collection Time: 01/28/21  7:00 PM   Specimen: BLOOD  Result Value Ref Range Status   Specimen Description BLOOD SITE NOT SPECIFIED  Final   Special Requests   Final    BOTTLES DRAWN AEROBIC AND ANAEROBIC Blood Culture results may not be optimal due to an inadequate volume of blood received in culture bottles   Culture   Final    NO GROWTH 2 DAYS Performed at [facility], [place] [address], [place], [place] [address]    Report  Status PENDING  Incomplete  Blood Culture (routine x 2)     Status: None (Preliminary result)   Collection Time: 01/28/21  7:10 PM   Specimen: BLOOD  Result Value Ref Range Status   Specimen Description BLOOD SITE NOT SPECIFIED  Final   Special Requests   Final    BOTTLES DRAWN AEROBIC AND ANAEROBIC Blood Culture results may not be optimal due to an inadequate volume of blood received in culture bottles   Culture   Final    NO GROWTH 2 DAYS Performed at [facility], [place] [address] [place],  [address]    Report Status PENDING  Incomplete     Radiology Studies: CT HEAD WO CONTRAST  Result Date: 01/30/2021 CLINICAL DATA:  Unwitnessed fall EXAM: CT HEAD WITHOUT CONTRAST TECHNIQUE: Contiguous axial images were obtained from the base of the skull through the vertex without intravenous contrast. COMPARISON:  12/14/2020 FINDINGS: Brain: Large remote left MCA branch infarct affecting the frontal lobe and insula with expected evolution from prior. Small chronic infarct along the high right frontal parietal junction. Confluent chronic small vessel ischemia in the cerebral white matter. No evidence of acute infarct, hemorrhage, hydrocephalus, or swelling. Chronic small vessel ischemic gliosis in the cerebral white matter Vascular: Atherosclerosis and A-comm region aneurysm coiling Skull: Negative for fracture. Sinuses/Orbits: Negative IMPRESSION: 1. No evidence of intracranial injury. 2. Aneurysm coiling and chronic ischemic injury as described. Electronically Signed   By: Monte Fantasia M.D.   On: 01/30/2021 05:00   Marzetta Board, MD, PhD [facility]  Between 7 am - 7 pm I am  available, please contact me via Amion or Securechat  Between 7 pm - 7 am I am not available, please contact night coverage MD/APP via Amion

## 2021-01-30 NOTE — Evaluation (Signed)
Occupational Therapy Evaluation Patient Details Name: Mindy Mills MRN: [id number] DOB: 04-Jan-1951 Today's Date: 01/30/2021    History of Present Illness 70 year old female with remote hemorrhagic CVA, CKD 3B, chronic combined CHF, COPD with chronic hypoxia, LV mural thrombus with cardioembolic CVA in December [id number] now on Coumadin, dysphagia comes into the hospital from SNF due to lethargy and decreased responsiveness.  She was diagnosed with Covid about one week PTA and she was noted to have worsening lethargy, fatigue.   Clinical Impression   Pt admitted with above. She demonstrates the below listed deficits and will benefit from continued OT to maximize safety and independence with BADLs.  Pt presents to OT with Rt residual waekness, impaired balance, decreased activity tolerance, impaired communication and impaired cognition.  She currently requires set up - total A for ADL and min A +2 for sit to stand.  Activity limited this date due to pt with orthostatic hypotension and pt incontinent of stool.  She has been at [facility] since CVA in December, and has required assist for ADLs and functional mobility.  Recommend return to SNF at discharge.  Will follow.  See below for vitals during session.       Follow Up Recommendations  SNF    Equipment Recommendations  None recommended by OT    Recommendations for Other Services       Precautions / Restrictions Precautions Precautions: Fall;Other (comment) Precaution Comments: orthostatic      Mobility Bed Mobility Overal bed mobility: Needs Assistance Bed Mobility: Rolling;Supine to Sit;Sit to Supine Rolling: Min assist   Supine to sit: Min assist Sit to supine: Min assist   General bed mobility comments: +rail    Transfers Overall transfer level: Needs assistance Equipment used: Rolling walker (2 wheeled) Transfers: Sit to/from Stand Sit to Stand: Min assist;+2 safety/equipment         General transfer comment: assist to  power up, +2 safety due to orthostatic hypotension    Balance Overall balance assessment: Needs assistance Sitting-balance support: No upper extremity supported;Feet supported Sitting balance-Leahy Scale: Fair     Standing balance support: Bilateral upper extremity supported;During functional activity Standing balance-Leahy Scale: Poor Standing balance comment: reliant on external support                           ADL either performed or assessed with clinical judgement   ADL Overall ADL's : Needs assistance/impaired Eating/Feeding: Set up;Bed level   Grooming: Wash/dry hands;Wash/dry face;Set up;Supervision/safety;Bed level   Upper Body Bathing: Moderate assistance;Sitting   Lower Body Bathing: Maximal assistance;Sit to/from stand   Upper Body Dressing : Moderate assistance;Sitting   Lower Body Dressing: Total assistance;Sit to/from stand   Toilet Transfer: Total assistance Toilet Transfer Details (indicate cue type and reason): unable secondary to orthostasis Toileting- Clothing Manipulation and Hygiene: Total assistance;Bed level Toileting - Clothing Manipulation Details (indicate cue type and reason): pt incontinent of stool.  Assisted with peri care in supine     Functional mobility during ADLs: Minimal assistance;+2 for safety/equipment (sit to stand)       Vision   Additional Comments: Pt with history of questionable Rt visual field deficit from prior CVA     Perception Perception Perception Tested?: Yes Perception Deficits: Inattention/neglect   Praxis      Pertinent Vitals/Pain Pain Assessment: Faces Faces Pain Scale: Hurts even more Pain Location: perineal area with hygiene Pain Descriptors / Indicators: Grimacing;Guarding;Moaning Pain Intervention(s): Repositioned;Other (comment) (barrier cream applied)  Hand Dominance Right   Extremity/Trunk Assessment Upper Extremity Assessment Upper Extremity Assessment: RUE deficits/detail RUE  Deficits / Details: Rt UE weaknss noted, but she will use Rt UE spontaneously during activity   Lower Extremity Assessment Lower Extremity Assessment: Defer to PT evaluation   Cervical / Trunk Assessment Cervical / Trunk Assessment: Kyphotic   Communication Communication Communication: Receptive difficulties;Expressive difficulties   Cognition Arousal/Alertness: Awake/alert Behavior During Therapy: WFL for tasks assessed/performed;Impulsive Overall Cognitive Status: Difficult to assess                                 General Comments: minimal incoherent speech at baseline, primarily nonverbal. Follows simple commands consistently with increased time.   General Comments  Pt presenting with decreased BP on arrival. She appears to have low BP at baseline. BP supine on arrival 97/57. Sitting EOB 90/58. Upon standing, pt demonstrating signs of feeling dizzy and attempting to return to EOB. Upon return to sit, BP 50/33. Pt then assisted with transition back to supine. BP in supine 94/62. After 5-7 minutes, BP in supine 101/69. HR primarily 80s-90s. Max HR during stand, 124. SpO2 87-90% on 5L.    Exercises     Shoulder Instructions      Home Living Family/patient expects to be discharged to:: Skilled nursing facility                                        Prior Functioning/Environment Level of Independence: Needs assistance  Gait / Transfers Assistance Needed: independent prior to 11/2020 CVA. Has been at [facility] for therapy since d/c from hospital. Ambulating with RW and assist. ADL's / Homemaking Assistance Needed: assist for all ADLs            OT Problem List: Decreased strength;Decreased range of motion;Decreased activity tolerance;Impaired balance (sitting and/or standing);Impaired vision/perception;Decreased coordination;Decreased cognition;Decreased safety awareness;Decreased knowledge of use of DME or AE;Cardiopulmonary status limiting  activity;Impaired UE functional use      OT Treatment/Interventions: Self-care/ADL training;Neuromuscular education;DME and/or AE instruction;Therapeutic activities;Cognitive remediation/compensation;Visual/perceptual remediation/compensation;Patient/family education;Balance training    OT Goals(Current goals can be found in the care plan section) Acute Rehab OT Goals Patient Stated Goal: unable to state OT Goal Formulation: Patient unable to participate in goal setting Time For Goal Achievement: 02/13/21 Potential to Achieve Goals: Good ADL Goals Pt Will Perform Grooming: with min guard assist;standing Pt Will Perform Upper Body Bathing: with min assist;sitting Pt Will Perform Lower Body Bathing: with mod assist;sit to/from stand Pt Will Perform Upper Body Dressing: with min assist;sitting Pt Will Perform Lower Body Dressing: with mod assist;sit to/from stand Pt Will Transfer to Toilet: with min assist;ambulating;regular height toilet;bedside commode;grab bars Pt Will Perform Toileting - Clothing Manipulation and hygiene: sit to/from stand;with min assist  OT Frequency: Min 2X/week   Barriers to D/C: Decreased caregiver support          Co-evaluation PT/OT/SLP Co-Evaluation/Treatment: Yes Reason for Co-Treatment: Necessary to address cognition/behavior during functional activity;To address functional/ADL transfers PT goals addressed during session: Mobility/safety with mobility;Balance;Proper use of DME OT goals addressed during session: ADL's and self-care      AM-PAC OT "6 Clicks" Daily Activity     Outcome Measure Help from another person eating meals?: A Little Help from another person taking care of personal grooming?: A Little Help from another person toileting, which includes using  toliet, bedpan, or urinal?: A Lot Help from another person bathing (including washing, rinsing, drying)?: A Lot Help from another person to put on and taking off regular upper body clothing?:  A Lot Help from another person to put on and taking off regular lower body clothing?: Total 6 Click Score: 13   End of Session Nurse Communication: Mobility status;Other (comment) (and orthostatic hypotension)  Activity Tolerance: Treatment limited secondary to medical complications (Comment);Other (comment) (orthostatic hypotension and incontinence) Patient left: in bed;with call bell/phone within reach;with bed alarm set  OT Visit Diagnosis: Unsteadiness on feet (R26.81);Cognitive communication deficit (R41.841) Symptoms and signs involving cognitive functions: Cerebral infarction                Time: 9924-2683 OT Time Calculation (min): 28 min Charges:  OT General Charges $OT Visit: 1 Visit OT Evaluation $OT Eval Moderate Complexity: 1 Mod  Nilsa Nutting., OTR/L Acute Rehabilitation Services Pager [phone] Office [phone]   Lucille Passy M 01/30/2021, 3:23 PM

## 2021-01-30 NOTE — Progress Notes (Signed)
     Referral received for Mindy Mills :goals of care discussion. Chart reviewed.  Patient is unable to engage appropriately in discussions (nonverbal). Attempted to contact patient's daughter, Mindy Mills. Unable to reach. Voicemail left with contact information given.   PMT will re-attempt to contact family at a later time/date. Detailed note and recommendations to follow once GOC has been completed.   Thank you for your referral and allowing PMT to assist in [name] care.   Alda Lea, AGPCNP-BC Palliative Medicine Team  Phone: [phone] Pager: [phone] Amion: N. [place]   NO CHARGE

## 2021-01-30 NOTE — Progress Notes (Signed)
ANTICOAGULATION CONSULT NOTE - Follow Up Consult  Pharmacy Consult for Warfarin Indication: LV thrombus  Allergies  Allergen Reactions  . Bee Venom Anaphylaxis and Hives  . Capsicum (Cayenne) [Cayenne] Swelling    Patient Measurements: Weight: 38 kg (83 lb 12.4 oz)   Vital Signs: Temp: 98.2 F (36.8 C) (02/03 0721) Temp Source: Oral (02/03 0721) BP: 94/57 (02/03 0721) Pulse Rate: 83 (02/03 0721)  Labs: Recent Labs    01/28/21 1842 01/28/21 1947 01/28/21 2125 01/29/21 0058 01/30/21 0547  HGB 14.5 14.6  --  12.6 12.7  HCT 46.4* 43.0  --  39.3 39.8  PLT 442*  --   --  341 291  APTT 33  --   --   --   --   LABPROT 14.5  --   --  15.4* 19.2*  INR 1.2  --   --  1.3* 1.7*  CREATININE 2.56* 2.50* 2.42* 2.30* 1.06*  TROPONINIHS 18*  --  16  --   --     Estimated Creatinine Clearance: 30.5 mL/min (A) (by C-G formula based on SCr of 1.06 mg/dL (H)).  Assessment: Anticoag: H/O LV thrombus/CVA, CBC WNL, Ddimer 0.54. INR 1.7 up. CBC stable. - Coumadin 5 mg daily at NH, INR 1.2 on admit  Goal of Therapy:  INR 2-3 Monitor platelets by anticoagulation protocol: Yes   Plan:  Warfarin 6mg  po x 1 today Daily INR F/u to resume home meds   Denica Web S. Alford Highland, PharmD, BCPS Clinical Staff Pharmacist [email or web address] Alford Highland, [place] 01/30/2021,8:41 AM

## 2021-01-30 NOTE — Progress Notes (Signed)
   01/30/21 0258  What Happened  Was fall witnessed? No  Was patient injured? Unsure  Patient found on floor  Found by Staff-comment Donnetta Simpers NT)  Stated prior activity other (comment) (in bed)  Follow Up  MD notified [place]  Time MD notified 0300  Family notified Yes - comment  Time family notified [phone]  Additional tests Yes-comment (CT ordred)  Progress note created (see row info) Yes  Adult Fall Risk Assessment  Risk Factor Category (scoring not indicated) Fall has occurred during this admission (document High fall risk)  Age 70  Fall History: Fall within 6 months prior to admission 0  Elimination; Bowel and/or Urine Incontinence 2  Elimination; Bowel and/or Urine Urgency/Frequency 0  Medications: includes PCA/Opiates, Anti-convulsants, Anti-hypertensives, Diuretics, Hypnotics, Laxatives, Sedatives, and Psychotropics 3  Patient Care Equipment 1  Mobility-Assistance 2  Mobility-Gait 0  Mobility-Sensory Deficit 2  Altered awareness of immediate physical environment 1  Impulsiveness 2  Lack of understanding of one's physical/cognitive limitations 4  Total Score 18  Patient Fall Risk Level High fall risk  Adult Fall Risk Interventions  Required Bundle Interventions *See Row Information* High fall risk - low, moderate, and high requirements implemented  Additional Interventions Use of appropriate toileting equipment (bedpan, BSC, etc.)  Screening for Fall Injury Risk (To be completed on HIGH fall risk patients) - Assessing Need for Low Bed  Risk For Fall Injury- Low Bed Criteria Previous fall this admission (fall this admission)  Will Implement Low Bed and Floor Mats Yes  Vitals  Temp (!) 97.3 F (36.3 C)  Temp Source Oral  BP 126/89  MAP (mmHg) 100  BP Location Right Leg  BP Method Automatic  Patient Position (if appropriate) Lying  Pulse Rate 81  Pulse Rate Source Monitor (bedside)  ECG Heart Rate 82  Resp 20  Oxygen Therapy  SpO2 91 %  O2 Device Nasal Cannula   O2 Flow Rate (L/min) 3 L/min  Patient Activity (if Appropriate) In bed  Neurological  Level of Consciousness Alert   Nurse Tech Kaylee was walking down the hall and seen patient on the floor. We went in room pt smiled. We helped patient off the floor. Patient had a bowel movement. Vitals were taken, pt denies pain by shaking head no. No visible signs of injury. MD notified CT ordered. Daughter Erasmo Downer notified of fall at 56.

## 2021-01-30 NOTE — Evaluation (Signed)
Physical Therapy Evaluation Patient Details Name: Mindy Mills MRN: [id number] DOB: 1951-08-21 Today's Date: 01/30/2021   History of Present Illness  70 year old female with remote hemorrhagic CVA, CKD 3B, chronic combined CHF, COPD with chronic hypoxia, LV mural thrombus with cardioembolic CVA in December 6967 now on Coumadin, dysphagia comes into the hospital from SNF due to lethargy and decreased responsiveness.  She was diagnosed with Covid about one week PTA and she was noted to have worsening lethargy, fatigue.    Clinical Impression  Pt admitted with above diagnosis. On eval, pt required min assist bed mobility and min assist +2 safety sit to stand with RW. Upon initiation of stance, pt noted to be soiled with stool. Pt assisted with pericare by OT. Pt on 5L O2 during session with SpO2 87-90%. Mobility significantly limited by orthostatic hypotension. See BP chart below. Pt required return to supine at end of session. Pt currently with functional limitations due to the deficits listed below (see PT Problem List). Pt will benefit from skilled PT to increase their independence and safety with mobility to allow discharge to the venue listed below.                                                                          BP Supine prior to mobility                           97/57 Seated EOB                                              90/58 Sit to stand performed. Pt unable to  tolerate and returned to EOB. Seated EOB after standing                     50/33 Return to supine                                       94/62 5-7 minutes in supine                              101/69    Follow Up Recommendations SNF    Equipment Recommendations  None recommended by PT    Recommendations for Other Services       Precautions / Restrictions Precautions Precautions: Fall;Other (comment) Precaution Comments: orthostatic      Mobility  Bed Mobility Overal bed mobility: Needs Assistance Bed  Mobility: Rolling;Supine to Sit;Sit to Supine Rolling: Min assist   Supine to sit: Min assist Sit to supine: Min assist   General bed mobility comments: +rail    Transfers Overall transfer level: Needs assistance Equipment used: Rolling walker (2 wheeled) Transfers: Sit to/from Stand Sit to Stand: Min assist;+2 safety/equipment         General transfer comment: assist to power up, +2 safety due to orthostatic  Ambulation/Gait  General Gait Details: unable due to orthostatic with stance  Stairs            Wheelchair Mobility    Modified Rankin (Stroke Patients Only)       Balance Overall balance assessment: Needs assistance Sitting-balance support: No upper extremity supported;Feet supported Sitting balance-Leahy Scale: Fair     Standing balance support: Bilateral upper extremity supported;During functional activity Standing balance-Leahy Scale: Poor Standing balance comment: reliant on external support                             Pertinent Vitals/Pain Pain Assessment: No/denies pain    Home Living Family/patient expects to be discharged to:: Skilled nursing facility                      Prior Function Level of Independence: Needs assistance   Gait / Transfers Assistance Needed: independent prior to 11/2020 CVA. Has been at [facility] for therapy since d/c from hospital. Ambulating with RW and assist.  ADL's / Homemaking Assistance Needed: assist for all ADLs        Hand Dominance   Dominant Hand: Right    Extremity/Trunk Assessment   Upper Extremity Assessment Upper Extremity Assessment: Defer to OT evaluation    Lower Extremity Assessment Lower Extremity Assessment: Generalized weakness    Cervical / Trunk Assessment Cervical / Trunk Assessment: Kyphotic  Communication   Communication: Expressive difficulties;Receptive difficulties (receptive is largely intact. Difficult to fully assess.)  Cognition  Arousal/Alertness: Awake/alert Behavior During Therapy: WFL for tasks assessed/performed;Impulsive (mildly impulsive) Overall Cognitive Status: Difficult to assess                                 General Comments: minimal incoherent speech at baseline, primarily nonverbal. Follows simple commands consistently with increased time.      General Comments General comments (skin integrity, edema, etc.): Pt presenting with decreased BP on arrival. She appears to have low BP at baseline. BP supine on arrival 97/57. Sitting EOB 90/58. Upon standing, pt demonstrating signs of feeling dizzy and attempting to return to EOB. Upon return to sit, BP 50/33. Pt then assisted with transition back to supine. BP in supine 94/62. After 5-7 minutes, BP in supine 101/69. HR primarily 80s-90s. Max HR during stand, 124. SpO2 87-90% on 5L.    Exercises     Assessment/Plan    PT Assessment Patient needs continued PT services  PT Problem List Decreased strength;Decreased mobility;Decreased safety awareness;Decreased activity tolerance;Decreased balance;Decreased cognition       PT Treatment Interventions Therapeutic activities;Cognitive remediation;Therapeutic exercise;Gait training;Balance training;Functional mobility training;Patient/family education    PT Goals (Current goals can be found in the Care Plan section)  Acute Rehab PT Goals Patient Stated Goal: unable to state PT Goal Formulation: Patient unable to participate in goal setting Time For Goal Achievement: 02/13/21 Potential to Achieve Goals: Fair    Frequency Min 2X/week   Barriers to discharge        Co-evaluation PT/OT/SLP Co-Evaluation/Treatment: Yes Reason for Co-Treatment: For patient/therapist safety;To address functional/ADL transfers PT goals addressed during session: Mobility/safety with mobility;Balance;Proper use of DME         AM-PAC PT "6 Clicks" Mobility  Outcome Measure Help needed turning from your back to  your side while in a flat bed without using bedrails?: A Little Help needed moving from lying on your back to  sitting on the side of a flat bed without using bedrails?: A Little Help needed moving to and from a bed to a chair (including a wheelchair)?: A Lot Help needed standing up from a chair using your arms (e.g., wheelchair or bedside chair)?: A Little Help needed to walk in hospital room?: A Lot Help needed climbing 3-5 steps with a railing? : Total 6 Click Score: 14    End of Session Equipment Utilized During Treatment: Oxygen Activity Tolerance: Treatment limited secondary to medical complications (Comment) (orthostatic) Patient left: in bed;with call bell/phone within reach;with bed alarm set Nurse Communication: Mobility status PT Visit Diagnosis: Unsteadiness on feet (R26.81);Muscle weakness (generalized) (M62.81);Difficulty in walking, not elsewhere classified (R26.2);History of falling (Z91.81)    Time: [id number] PT Time Calculation (min) (ACUTE ONLY): 28 min   Charges:   PT Evaluation $PT Eval Moderate Complexity: 1 Mod          Lorrin Goodell, PT  Office # [phone] Pager [phone]   Lorriane Shire 01/30/2021, 12:19 PM

## 2021-01-31 DIAGNOSIS — I639 Cerebral infarction, unspecified: Secondary | ICD-10-CM

## 2021-01-31 LAB — CBC WITH DIFFERENTIAL/PLATELET
Abs Immature Granulocytes: 0.07 10*3/uL (ref 0.00–0.07)
Basophils Absolute: 0 10*3/uL (ref 0.0–0.1)
Basophils Relative: 0 %
Eosinophils Absolute: 0 10*3/uL (ref 0.0–0.5)
Eosinophils Relative: 0 %
HCT: 38.3 % (ref 36.0–46.0)
Hemoglobin: 12.1 g/dL (ref 12.0–15.0)
Immature Granulocytes: 0 %
Lymphocytes Relative: 13 %
Lymphs Abs: 2.1 10*3/uL (ref 0.7–4.0)
MCH: 28.4 pg (ref 26.0–34.0)
MCHC: 31.6 g/dL (ref 30.0–36.0)
MCV: 89.9 fL (ref 80.0–100.0)
Monocytes Absolute: 1.3 10*3/uL — ABNORMAL HIGH (ref 0.1–1.0)
Monocytes Relative: 8 %
Neutro Abs: 12.4 10*3/uL — ABNORMAL HIGH (ref 1.7–7.7)
Neutrophils Relative %: 79 %
Platelets: 315 10*3/uL (ref 150–400)
RBC: 4.26 MIL/uL (ref 3.87–5.11)
RDW: 13.5 % (ref 11.5–15.5)
WBC: 16 10*3/uL — ABNORMAL HIGH (ref 4.0–10.5)
nRBC: 0 % (ref 0.0–0.2)

## 2021-01-31 LAB — COMPREHENSIVE METABOLIC PANEL
ALT: 13 U/L (ref 0–44)
AST: 16 U/L (ref 15–41)
Albumin: 2.6 g/dL — ABNORMAL LOW (ref 3.5–5.0)
Alkaline Phosphatase: 95 U/L (ref 38–126)
Anion gap: 10 (ref 5–15)
BUN: 26 mg/dL — ABNORMAL HIGH (ref 8–23)
CO2: 25 mmol/L (ref 22–32)
Calcium: 9.2 mg/dL (ref 8.9–10.3)
Chloride: 111 mmol/L (ref 98–111)
Creatinine, Ser: 1.01 mg/dL — ABNORMAL HIGH (ref 0.44–1.00)
GFR, Estimated: >60 mL/min (ref >60)
Glucose, Bld: 108 mg/dL — ABNORMAL HIGH (ref 70–99)
Potassium: 3.9 mmol/L (ref 3.5–5.1)
Sodium: 146 mmol/L — ABNORMAL HIGH (ref 135–145)
Total Bilirubin: 0.7 mg/dL (ref 0.3–1.2)
Total Protein: 6.6 g/dL (ref 6.5–8.1)

## 2021-01-31 LAB — C-REACTIVE PROTEIN: CRP: 8 mg/dL — ABNORMAL HIGH (ref <1.0)

## 2021-01-31 LAB — PROTIME-INR
INR: 2.9 — ABNORMAL HIGH (ref 0.8–1.2)
Prothrombin Time: 29.2 seconds — ABNORMAL HIGH (ref 11.4–15.2)

## 2021-01-31 LAB — D-DIMER, QUANTITATIVE: D-Dimer, Quant: 0.6 ug/mL-FEU — ABNORMAL HIGH (ref 0.00–0.50)

## 2021-01-31 MED ORDER — WARFARIN SODIUM 5 MG PO TABS: 2.5000 mg | ORAL_TABLET | Freq: Every day | ORAL | 2 refills | Status: DC

## 2021-01-31 MED ORDER — POTASSIUM CHLORIDE CRYS ER 20 MEQ PO TBCR: 40.0000 meq | EXTENDED_RELEASE_TABLET | Freq: Once | ORAL | Status: DC

## 2021-01-31 MED ORDER — CLONAZEPAM 0.5 MG PO TABS: 0.5000 mg | ORAL_TABLET | Freq: Two times a day (BID) | ORAL | 0 refills | Status: DC

## 2021-01-31 MED ORDER — POTASSIUM CHLORIDE CRYS ER 10 MEQ PO TBCR: 10.0000 meq | EXTENDED_RELEASE_TABLET | Freq: Once | ORAL | Status: DC

## 2021-01-31 NOTE — Progress Notes (Signed)
ANTICOAGULATION CONSULT NOTE - Follow Up Consult  Pharmacy Consult for Warfarin Indication: LV thrombus  Allergies  Allergen Reactions  . Bee Venom Anaphylaxis and Hives  . Capsicum (Cayenne) [Cayenne] Swelling  . Morphine Nausea And Vomiting  . Topiramate Hives    Patient Measurements: Weight: 38.2 kg (84 lb 3.5 oz)   Vital Signs: Temp: 98.5 F (36.9 C) (02/04 0750) Temp Source: Axillary (02/04 1215) BP: 112/69 (02/04 1215) Pulse Rate: 86 (02/04 1215)  Labs: Recent Labs    01/28/21 1842 01/28/21 1947 01/28/21 2125 01/29/21 0058 01/30/21 0547 01/31/21 0056  HGB 14.5   < >  --  12.6 12.7 12.1  HCT 46.4*   < >  --  39.3 39.8 38.3  PLT 442*  --   --  341 291 315  APTT 33  --   --   --   --   --   LABPROT 14.5  --   --  15.4* 19.2* 29.2*  INR 1.2  --   --  1.3* 1.7* 2.9*  CREATININE 2.56*   < > 2.42* 2.30* 1.06* 1.01*  TROPONINIHS 18*  --  16  --   --   --    < > = values in this interval not displayed.    Estimated Creatinine Clearance: 32.1 mL/min (A) (by C-G formula based on SCr of 1.01 mg/dL (H)).  Assessment: 70 years of age female on Warfarin therapy for LV thrombus and cardio embolic CVA. Note patient has a remote history of a hemorrhagic CVA. Patient is from SNF with Warfarin dose of 5mg  daily per SNF MAR.   INR jumped significantly from 1.7 to 2.9 today. Suspect this may be due to poor po intake lowering vitamin K intake. No significant medication interactions noted. CBC remains stable. No bleeding noted.   *Note that SNF MAR states Coumadin stopped 01/26/21 - question if patient was to continue?   Goal of Therapy:  INR 2-3 Monitor platelets by anticoagulation protocol: Yes   Plan:  Reduce Warfarin to 2.5 mg po x1 today to allow INR to fall back to normal range.  Note patient discharging back to SNF - recommend decrease to 2.5 mg daily with INR check in 1-2 days.  *Note that SNF MAR states Coumadin stopped 01/26/21 - question if patient was to continue?   Plan and concerns discussed with discharging MD - Dr. Cruzita Lederer Daily INR while inpatient   Sloan Leiter, PharmD, BCPS, BCCCP Clinical Pharmacist Please refer to [facility] for [place] numbers 01/31/2021,1:43 PM

## 2021-01-31 NOTE — TOC Transition Note (Signed)
Transition of Care [facility]) - CM/SW Discharge Note   Patient Details  Name: Mindy Mills MRN: [id number] Date of Birth: July 03, 1951  [facility]) CM/SW Contact:  Benard Halsted, LCSW Phone Number: 01/31/2021, 10:57 AM   Clinical Narrative:    Patient will DC to: [facility]  Anticipated DC date: 01/31/21 Family notified: Daughter, Armed forces technical officer by: Corey Harold   Per MD patient ready for DC to [facility]. RN to call report prior to discharge [phone]). RN, patient, patient's family, and facility notified of DC. Discharge Summary and FL2 sent to facility. DC packet on chart. Ambulance transport requested for patient.   CSW will sign off for now as social work intervention is no longer needed. Please consult [place] again if new needs arise.      Final next level of care: Skilled Nursing Facility Barriers to Discharge: No Barriers Identified   Patient Goals and CMS Choice Patient states their goals for this hospitalization and ongoing recovery are:: Rehab CMS Medicare.gov Compare Post Acute Care list provided to:: Patient Represenative (must comment) Choice offered to / list presented to : Adult Children  Discharge Placement   Existing PASRR number confirmed : 01/31/21          Patient chooses bed at: [facility] Patient to be transferred to facility by: [place] Name of family member notified: Daughter Patient and family notified of of transfer: 01/31/21  Discharge Plan and Services In-house Referral: Clinical Social Work   Post Acute Care Choice: [place]                               Social Determinants of Health (SDOH) Interventions     Readmission Risk Interventions No flowsheet data found.

## 2021-01-31 NOTE — Care Management Important Message (Signed)
Important Message  Patient Details  Name: Mindy Mills MRN: [id number] Date of Birth: December 24, 1951   Medicare Important Message Given:  Yes - Important Message mailed due to current National Emergency   Verbal consent obtained due to current National Emergency  Relationship to patient: Self Contact Name: Gearldene Fiorenza Call Date: 01/31/21  Time: 1502 Phone: [phone] Outcome: No Answer/Busy Important Message mailed to: Patient address on file    Delorse Lek 01/31/2021, 3:02 PM

## 2021-01-31 NOTE — Progress Notes (Signed)
Patient was discharged from [id number]. Skin intact with the exception of contact dermatitis on bilateral buttocks. Foam in place. IV's d/c'd. On 3L O2 [place] (baseline).   [facility] will transport to [place]. Discharge paperwork at secretary desk for [place] to retrieve.

## 2021-01-31 NOTE — Discharge Summary (Addendum)
Physician Discharge Summary  RUS KRISHNASWAMY [id number] DOB: 1951-02-08 DOA: 01/28/2021  PCP: Patient, No Pcp Per  Admit date: 01/28/2021 Discharge date: 01/31/2021  Admitted From: SNF Disposition:  SNF  Recommendations for Outpatient Follow-up:  1. Follow up with PCP in 1-2 weeks  Home Health: none Equipment/Devices: none  Discharge Condition: stable CODE STATUS: DNR Diet recommendation: heart healthy  HPI: Per admitting MD, KOREA KOHLBECK is a 70 y.o. female with medical history significant for remote hemorrhagic CVA, chronic kidney disease 3B, chronic combined systolic and diastolic CHF, COPD with chronic hypoxic respiratory failure, LV mural thrombus with cardioembolic CVA in December [id number] now on warfarin, and dysphagia, now presenting to the emergency department from her SNF for evaluation of lethargy and decreased responsivenes. History was obtained through discussion with ED personnel, review of EMR, and discussion with the patient's daughter-in-law who is a Marine scientist. Patient was reportedly diagnosed with COVID-19 approximately 1 week ago, was noticed to have worsening lethargy, fatigue, and loss of appetite over the past few days. She was said to be poorly responsive during therapy, oxygen saturation was 91% on her usual 3 L/min of supplemental oxygen, EMS was called, she was placed on nonrebreather, and sent to the ED. Per daughter-in-law, at baseline the patient does not have comprehensible speech since the most recent CVA, requires assistance with all ADLs, takes thickened liquids, and would not want any heroic measures.  Hospital Course / Discharge diagnoses: Principal Problem Acute kidney injury on chronic kidney disease stage IIIb, hyperkalemia -Patient presents from SNF where she has been more lethargic in the last few days since Covid diagnosis, poor p.o. intake.  Baseline creatinine is about 1.3, during admission was 2.5 but gradually improving.  She has received IV fluids,  creatinine has returned to normal at 1.0, has remained normal despite not being on further IV fluids.  She has good p.o. intake, energy levels have improved and clinically she has returned to her baseline.  She received Lokelma for couple of days, her potassium actually became low, repleted but now normalized on discharge.  Active Problems COVID-19-reportedly diagnosed about a week ago at the SNF, chest x-ray is clear and she is at baseline respiratory status on chronic 3 L.  Only recommend 10 days of total isolation COPD, chronic hypoxia-stable, no wheezing LV thrombus, history of cardioembolic CVA - continue Coumadin.  Continue aspirin Hyperlipidemia-Resume home statin Depression/anxiety-Resume home medications Chronic combined CHF-echo from December 2021 showed an EF of 25-30%.   Appears euvolemic on discharge Leukocytosis due to UTI-WBC 15 K on admission, increased to 21K, urinalysis showed evidence of a UTI, patient is unable to verbalize any symptoms and was treated with fosfomycin x1.  Leukocytosis improving to 16 K on discharge after antibiotics suggesting UTI as possible culprit.  Sepsis ruled out   Discharge Instructions   Allergies as of 01/31/2021      Reactions   Bee Venom Anaphylaxis, Hives   Capsicum (cayenne) [cayenne] Swelling   Morphine Nausea And Vomiting   Topiramate Hives      Medication List    STOP taking these medications   amoxicillin-clavulanate 875-125 MG tablet Commonly known as: AUGMENTIN   methocarbamol 500 MG tablet Commonly known as: ROBAXIN   oxyCODONE-acetaminophen 5-325 MG tablet Commonly known as: Percocet   traMADol 50 MG tablet Commonly known as: ULTRAM     TAKE these medications   albuterol (2.5 MG/3ML) 0.083% nebulizer solution Commonly known as: PROVENTIL Take 3 mLs (2.5 mg total) by nebulization every  4 (four) hours as needed for wheezing or shortness of breath.   arformoterol 15 MCG/2ML Nebu Commonly known as: BROVANA Take 2 mLs  (15 mcg total) by nebulization 2 (two) times daily.   aspirin 325 MG EC tablet Take 325 mg by mouth daily.   budesonide 0.5 MG/2ML nebulizer solution Commonly known as: PULMICORT Take 2 mLs (0.5 mg total) by nebulization 2 (two) times daily.   clonazePAM 0.5 MG tablet Commonly known as: KLONOPIN Take 1 tablet (0.5 mg total) by mouth 2 (two) times daily.   docusate sodium 100 MG capsule Commonly known as: COLACE Take 1 capsule (100 mg total) by mouth 2 (two) times daily.   EPINEPHrine 0.3 mg/0.3 mL Soaj injection Commonly known as: EPI-PEN Inject 0.3 mg into the muscle once as needed (for allergic reaction).   feeding supplement Liqd Take 237 mLs by mouth daily.   FLUoxetine 20 MG capsule Commonly known as: PROZAC Take 80 mg by mouth daily.   guaiFENesin 600 MG 12 hr tablet Commonly known as: MUCINEX Take 1 tablet (600 mg total) by mouth 2 (two) times daily.   metoprolol succinate 25 MG 24 hr tablet Commonly known as: TOPROL-XL Take 0.5 tablets (12.5 mg total) by mouth daily.   pantoprazole 40 MG tablet Commonly known as: PROTONIX Take 1 tablet (40 mg total) by mouth at bedtime.   rosuvastatin 40 MG tablet Commonly known as: CRESTOR Take 20 mg by mouth daily.   traZODone 100 MG tablet Commonly known as: DESYREL Take 100 mg by mouth at bedtime.   vitamin C 1000 MG tablet Take 500 mg by mouth in the morning and at bedtime.   Vitamin D 50 MCG (2000 UT) Caps Take 2,000 Units by mouth daily.   warfarin 5 MG tablet Commonly known as: COUMADIN Take 0.5 tablets (2.5 mg total) by mouth daily at 4 PM. What changed: how much to take       Contact information for after-discharge care    Destination    [facility] .   Service: Skilled Nursing Contact information: [place] [phone]                 Consultations:  None   Procedures/Studies:  None   CT HEAD WO CONTRAST  Result  Date: 01/30/2021 CLINICAL DATA:  Unwitnessed fall EXAM: CT HEAD WITHOUT CONTRAST TECHNIQUE: Contiguous axial images were obtained from the base of the skull through the vertex without intravenous contrast. COMPARISON:  12/14/2020 FINDINGS: Brain: Large remote left MCA branch infarct affecting the frontal lobe and insula with expected evolution from prior. Small chronic infarct along the high right frontal parietal junction. Confluent chronic small vessel ischemia in the cerebral white matter. No evidence of acute infarct, hemorrhage, hydrocephalus, or swelling. Chronic small vessel ischemic gliosis in the cerebral white matter Vascular: Atherosclerosis and A-comm region aneurysm coiling Skull: Negative for fracture. Sinuses/Orbits: Negative IMPRESSION: 1. No evidence of intracranial injury. 2. Aneurysm coiling and chronic ischemic injury as described. Electronically Signed   By: Monte Fantasia M.D.   On: 01/30/2021 05:00   DG Chest Port 1 View  Result Date: 01/28/2021 CLINICAL DATA:  Questionable sepsis - evaluate for abnormality lethargy. Decreased responsiveness. EXAM: PORTABLE CHEST 1 VIEW COMPARISON:  Radiograph and CT 12/07/2020. FINDINGS: Emphysema with chronic hyperinflation. Improved bronchial thickening from prior exam. Stable heart size and mediastinal contours. Aortic atherosclerosis. Retrocardiac hiatal hernia. No acute airspace disease. No pneumothorax or large pleural effusion. No pulmonary edema.  Remote left clavicle fracture. Bones are diffusely under mineralized. Patient is rotated. IMPRESSION: Emphysema with chronic hyperinflation. Improved bronchial thickening from prior exam. No acute airspace disease. Electronically Signed   By: Keith Rake M.D.   On: 01/28/2021 19:13     Subjective: - no chest pain, shortness of breath, no abdominal pain, nausea or vomiting.   Discharge Exam: BP 112/69 (BP Location: Left Arm)   Pulse 86   Temp 98.5 F (36.9 C) (Axillary)   Resp 19   Wt 38.2  kg   SpO2 90%   BMI 14.92 kg/m   General: Pt is alert, awake, not in acute distress Cardiovascular: RRR, S1/S2 +, no rubs, no gallops Respiratory: CTA bilaterally, no wheezing, no rhonchi Abdominal: Soft, NT, ND, bowel sounds + Extremities: no edema, no cyanosis   The results of significant diagnostics from this hospitalization (including imaging, microbiology, ancillary and laboratory) are listed below for reference.     Microbiology: Recent Results (from the past 240 hour(s))  SARS Coronavirus 2 by RT PCR (hospital order, performed in [facility] hospital lab) Nasopharyngeal Nasopharyngeal Swab     Status: Abnormal   Collection Time: 01/28/21  6:42 PM   Specimen: Nasopharyngeal Swab  Result Value Ref Range Status   SARS Coronavirus 2 POSITIVE (A) NEGATIVE Final    Comment: RESULT CALLED TO, READ BACK BY AND VERIFIED WITH: RN MOLLY  C 2036 [id number] FCP (NOTE) SARS-CoV-2 target nucleic acids are DETECTED  SARS-CoV-2 RNA is generally detectable in upper respiratory specimens  during the acute phase of infection.  Positive results are indicative  of the presence of the identified virus, but do not rule out bacterial infection or co-infection with other pathogens not detected by the test.  Clinical correlation with patient history and  other diagnostic information is necessary to determine patient infection status.  The expected result is negative.  Fact Sheet for Patients:   StrictlyIdeas.no   Fact Sheet for Healthcare Providers:   BankingDealers.co.za    This test is not yet approved or cleared by the [place] FDA and  has been authorized for detection and/or diagnosis of SARS-CoV-2 by FDA under an Emergency Use Authorization (EUA).  This EUA will remain in effect (meaning this test can  be used) for the duration of  the COVID-19 declaration under Section 564(b)(1) of the Act, 21 U.S.C. section 360-bbb-3(b)(1), unless the  authorization is terminated or revoked sooner.  Performed at [facility], [place] [address] [place], [place] [address]   Blood Culture (routine x 2)     Status: None (Preliminary result)   Collection Time: 01/28/21  7:00 PM   Specimen: BLOOD  Result Value Ref Range Status   Specimen Description BLOOD SITE NOT SPECIFIED  Final   Special Requests   Final    BOTTLES DRAWN AEROBIC AND ANAEROBIC Blood Culture results may not be optimal due to an inadequate volume of blood received in culture bottles   Culture   Final    NO GROWTH 3 DAYS Performed at [facility], [place] [address], [place], [place] [address]    Report Status PENDING  Incomplete  Blood Culture (routine x 2)     Status: None (Preliminary result)   Collection Time: 01/28/21  7:10 PM   Specimen: BLOOD  Result Value Ref Range Status   Specimen Description BLOOD SITE NOT SPECIFIED  Final   Special Requests   Final    BOTTLES DRAWN AEROBIC AND ANAEROBIC Blood Culture results may  not be optimal due to an inadequate volume of blood received in culture bottles   Culture   Final    NO GROWTH 3 DAYS Performed at [facility], [place] [address], [place], [place] [address]    Report Status PENDING  Incomplete     Labs: Basic Metabolic Panel: Recent Labs  Lab 01/28/21 1842 01/28/21 1947 01/28/21 2125 01/29/21 0058 01/29/21 0736 01/29/21 1913 01/29/21 2257 01/30/21 0547 01/30/21 0947 01/31/21 0056  NA 141 139 140 143  --   --   --  143  --  146*  K 3.9 6.5* 5.9* 3.5   < > 3.0* 2.9* 3.1* 3.2* 3.9  CL 100 104 102 103  --   --   --  105  --  111  CO2 20*  --  24 22  --   --   --  26  --  25  GLUCOSE 125* 123* 102* 95  --   --   --  100*  --  108*  BUN 71* 112* 65* 67*  --   --   --  38*  --  26*  CREATININE 2.56* 2.50* 2.42* 2.30*  --   --   --  1.06*  --  1.01*  CALCIUM 9.2  --  8.4* 8.6*  --   --   --  8.9  --  9.2  MG  --   --   --  2.1  --   --   --   --   --   --    < > = values in this interval not  displayed.   Liver Function Tests: Recent Labs  Lab 01/28/21 1842 01/29/21 0058 01/30/21 0547 01/31/21 0056  AST 21 19 20 16   ALT 19 16 14 13   ALKPHOS 120 96 87 95  BILITOT 1.5* 1.2 0.6 0.7  PROT 7.9 6.8 6.7 6.6  ALBUMIN 3.2* 2.7* 2.7* 2.6*   CBC: Recent Labs  Lab 01/28/21 1842 01/28/21 1947 01/29/21 0058 01/30/21 0547 01/31/21 0056  WBC 15.4*  --  20.3* 21.2* 16.0*  NEUTROABS 11.6*  --  17.1* 18.6* 12.4*  HGB 14.5 14.6 12.6 12.7 12.1  HCT 46.4* 43.0 39.3 39.8 38.3  MCV 89.9  --  91.2 89.0 89.9  PLT 442*  --  341 291 315   CBG: No results for input(s): GLUCAP in the last 168 hours. Hgb A1c No results for input(s): HGBA1C in the last 72 hours. Lipid Profile Recent Labs    01/28/21 1845  TRIG 125   Thyroid function studies No results for input(s): TSH, T4TOTAL, T3FREE, THYROIDAB in the last 72 hours.  Invalid input(s): FREET3 Urinalysis    Component Value Date/Time   COLORURINE YELLOW 01/29/2021 1613   APPEARANCEUR CLOUDY (A) 01/29/2021 1613   LABSPEC 1.024 01/29/2021 1613   PHURINE 5.0 01/29/2021 1613   GLUCOSEU NEGATIVE 01/29/2021 1613   HGBUR NEGATIVE 01/29/2021 1613   BILIRUBINUR NEGATIVE 01/29/2021 1613   KETONESUR 5 (A) 01/29/2021 1613   PROTEINUR NEGATIVE 01/29/2021 1613   UROBILINOGEN 1.0 09/08/2015 1817   NITRITE NEGATIVE 01/29/2021 1613   LEUKOCYTESUR SMALL (A) 01/29/2021 1613    FURTHER DISCHARGE INSTRUCTIONS:   Get Medicines reviewed and adjusted: Please take all your medications with you for your next visit with your Primary MD   Laboratory/radiological data: Please request your Primary MD to go over all hospital tests and procedure/radiological results at the follow up, please ask your Primary MD to get all  Hospital records sent to his/her office.   In some cases, they will be blood work, cultures and biopsy results pending at the time of your discharge. Please request that your primary care M.D. goes through all the records of your  hospital data and follows up on these results.   Also Note the following: If you experience worsening of your admission symptoms, develop shortness of breath, life threatening emergency, suicidal or homicidal thoughts you must seek medical attention immediately by calling 911 or calling your MD immediately  if symptoms less severe.   You must read complete instructions/literature along with all the possible adverse reactions/side effects for all the Medicines you take and that have been prescribed to you. Take any new Medicines after you have completely understood and accpet all the possible adverse reactions/side effects.    Do not drive when taking Pain medications or sleeping medications (Benzodaizepines)   Do not take more than prescribed Pain, Sleep and Anxiety Medications. It is not advisable to combine anxiety,sleep and pain medications without talking with your primary care practitioner   Special Instructions: If you have smoked or chewed Tobacco  in the last 2 yrs please stop smoking, stop any regular Alcohol  and or any Recreational drug use.   Wear Seat belts while driving.   Please note: You were cared for by a hospitalist during your hospital stay. Once you are discharged, your primary care physician will handle any further medical issues. Please note that NO REFILLS for any discharge medications will be authorized once you are discharged, as it is imperative that you return to your primary care physician (or establish a relationship with a primary care physician if you do not have one) for your post hospital discharge needs so that they can reassess your need for medications and monitor your lab values.  Time coordinating discharge: 35 minutes  SIGNED:  Marzetta Board, MD, PhD 01/31/2021, 2:07 PM

## 2021-01-31 NOTE — TOC Initial Note (Signed)
Transition of Care [facility]) - Initial/Assessment Note    Patient Details  Name: Mindy Mills MRN: [id number] Date of Birth: 02-Aug-1951  [facility] CM/SW Contact:    Benard Halsted, LCSW Phone Number: 01/31/2021, 10:25 AM  Clinical Narrative:                 CSW received consult to discharge patient back to [place] today. CSW spoke with patient's daughter and she is in agreement with plan to transport by PTAR. CSW submitted clinicals to [place]. Per [facility], CSW does not need to wait on auth as waiver is in place.   Expected Discharge Plan: Skilled Nursing Facility Barriers to Discharge: No Barriers Identified   Patient Goals and CMS Choice Patient states their goals for this hospitalization and ongoing recovery are:: Rehab CMS Medicare.gov Compare Post Acute Care list provided to:: Patient Represenative (must comment) Choice offered to / list presented to : Adult Children  Expected Discharge Plan and Services Expected Discharge Plan: [place] In-house Referral: Clinical Social Work   Post Acute Care Choice: [place] Living arrangements for the past 2 months: [place]                                      Prior Living Arrangements/Services Living arrangements for the past 2 months: [place] Lives with:: Facility Resident Patient language and need for interpreter reviewed:: Yes Do you feel safe going back to the place where you live?: Yes      Need for Family Participation in Patient Care: Yes (Comment) Care giver support system in place?: Yes (comment)   Criminal Activity/Legal Involvement Pertinent to Current Situation/Hospitalization: No - Comment as needed  Activities of Daily Living      Permission Sought/Granted Permission sought to share information with : Facility Contact Representative,Family Supports Permission granted to share information  with : No  Share Information with NAME: Erasmo Downer  Permission granted to share info w AGENCY: [place] granted to share info w Relationship: Daughter  Permission granted to share info w Contact Information: [phone]  Emotional Assessment   Attitude/Demeanor/Rapport: Unable to Assess Affect (typically observed): Unable to Assess Orientation: :  (Unable to speak clearly) Alcohol / Substance Use: Not Applicable Psych Involvement: No (comment)  Admission diagnosis:  Fever, unspecified fever cause [R50.9] Acute renal failure superimposed on stage 3b chronic kidney disease ([place]) [N17.9, N18.32] Patient Active Problem List   Diagnosis Date Noted  . Acute renal failure superimposed on stage 3b chronic kidney disease ([place]) 01/28/2021  . Chronic combined systolic and diastolic CHF (congestive heart failure) ([place]) 01/28/2021  . SIRS (systemic inflammatory response syndrome) ([place]) 01/28/2021  . Mural thrombus of cardiac apex 01/28/2021  . COVID-19 virus infection 01/28/2021  . Hyperkalemia 01/28/2021  . Chronic respiratory failure with hypoxia ([place])   . Cardioembolic stroke ([place]) 63/78/5885  . Protein-calorie malnutrition, severe 12/12/2020  . Pneumonia of right lower lobe due to infectious organism   . Acute systolic CHF (congestive heart failure) ([place]) 12/09/2020  . Emphysema/COPD ([place]) 12/09/2020  . Aspiration pneumonia ([place]) 12/07/2020  . Elevated troponin 12/07/2020  . Closed fracture of right distal radius 09/07/2018  . Fall 09/08/2015  . UTI (lower urinary tract infection) 09/08/2015  . Dehydration 09/08/2015  . Sepsis ([place]) 09/08/2015  . Systolic heart failure ([place]) 09/08/2015  . Confusion 09/08/2015  . TIA (  transient ischemic attack) 09/08/2015  . Acute on chronic respiratory failure ([place]) 09/24/2014  . 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 Demand ischemia of myocardium: in setting of CVA 09/17/2014  . 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 Subarachnoid hemorrhage due to ruptured aneurysm ([place]) 09/11/2014  . Acute respiratory failure ([place]) 09/11/2014  . Hypokalemia 09/11/2014  . Altered mental status 09/11/2014  . Subarachnoid hemorrhage ([place]) 09/11/2014   PCP:  Patient, No Pcp Per Pharmacy:  No Pharmacies Listed    Social Determinants of Health (SDOH) Interventions    Readmission Risk Interventions No flowsheet data found.

## 2021-01-31 NOTE — Plan of Care (Signed)
Patient slept throughout the night. VSS. Remains on 3L (baseline?). Purewick in place. Call bell within reach. Bed alarm on.   Problem: Education: Goal: Knowledge of risk factors and measures for prevention of condition will improve Outcome: Progressing   Problem: Coping: Goal: Psychosocial and spiritual needs will be supported Outcome: Progressing   Problem: Respiratory: Goal: Will maintain a patent airway Outcome: Progressing Goal: Complications related to the disease process, condition or treatment will be avoided or minimized Outcome: Progressing

## 2021-01-31 NOTE — NC FL2 (Signed)
[place] LEVEL OF CARE SCREENING TOOL     IDENTIFICATION  Patient Name: Mindy Mills Birthdate: 13-Sep-1951 Sex: female Admission Date (Current Location): 01/28/2021  [facility] and [place] Number:  Herbalist and Address:  [facility], [place] [address] [place], [place] [address]      Provider Number: [id number]  Attending Physician Name and Address:  Caren Griffins, MD  Relative Name and Phone Number:  Erasmo Downer (daughter) [phone]    Current Level of Care: Hospital Recommended Level of Care: [place] Prior Approval Number:    Date Approved/Denied:   PASRR Number: [id number]  Discharge Plan: SNF    Current Diagnoses: Patient Active Problem List   Diagnosis Date Noted  . Acute renal failure superimposed on stage 3b chronic kidney disease ([place]) 01/28/2021  . Chronic combined systolic and diastolic CHF (congestive heart failure) ([place]) 01/28/2021  . SIRS (systemic inflammatory response syndrome) ([place]) 01/28/2021  . Mural thrombus of cardiac apex 01/28/2021  . COVID-19 virus infection 01/28/2021  . Hyperkalemia 01/28/2021  . Chronic respiratory failure with hypoxia ([place])   . Cardioembolic stroke ([place]) 19/37/9024  . Protein-calorie malnutrition, severe 12/12/2020  . Pneumonia of right lower lobe due to infectious organism   . Acute systolic CHF (congestive heart failure) ([place]) 12/09/2020  . Emphysema/COPD ([place]) 12/09/2020  . Aspiration pneumonia ([place]) 12/07/2020  . Elevated troponin 12/07/2020  . Closed fracture of right distal radius 09/07/2018  . Fall 09/08/2015  . UTI (lower urinary tract infection) 09/08/2015  . Dehydration 09/08/2015  . Sepsis ([place]) 09/08/2015  . Systolic heart failure ([place]) 09/08/2015  . Confusion 09/08/2015  . TIA (transient ischemic attack) 09/08/2015  . Acute on chronic respiratory failure ([place]) 09/24/2014  . Tracheostomy status ([place]) 09/22/2014  . Demand ischemia of  myocardium: in setting of CVA 09/17/2014  . 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 Acute respiratory failure ([place]) 09/11/2014  . Hypokalemia 09/11/2014  . Altered mental status 09/11/2014  . Subarachnoid hemorrhage ([place]) 09/11/2014    Orientation RESPIRATION BLADDER Height & Weight     Self  O2 (3L) Incontinent,External catheter Weight: 84 lb 3.5 oz (38.2 kg) Height:     BEHAVIORAL SYMPTOMS/MOOD NEUROLOGICAL BOWEL NUTRITION STATUS      Continent Diet  AMBULATORY STATUS COMMUNICATION OF NEEDS Skin   Extensive Assist Non-Verbally Normal                       Personal Care Assistance Level of Assistance  Bathing,Feeding,Dressing Bathing Assistance: Maximum assistance Feeding assistance: Limited assistance Dressing Assistance: Maximum assistance     Functional Limitations Info  Sight,Hearing,Speech Sight Info: Adequate Hearing Info: Adequate Speech Info: Impaired    SPECIAL CARE FACTORS FREQUENCY  PT (By licensed PT),OT (By licensed OT)     PT Frequency: 5X per week OT Frequency: 5X per week            Contractures      Additional Factors Info  Code Status,Allergies,Isolation Precautions Code Status Info: DNR Allergies Info: Bee venom, Capsicum (cayenne), Morphine, Topiramate     Isolation Precautions Info: COVID-19     Current Medications (01/31/2021):  This is the current hospital active medication list Current Facility-Administered Medications  Medication Dose Route Frequency Provider Last Rate Last Admin  . acetaminophen (TYLENOL) tablet 650 mg  650 mg Oral Q6H PRN  Vianne Bulls, MD      . ascorbic acid (VITAMIN C) tablet 500 mg  500 mg Oral Daily Caren Griffins, MD   500 mg at 01/31/21 0846  . aspirin EC tablet 325 mg  325 mg Oral Daily Caren Griffins, MD   325 mg at 01/31/21 0846  . clonazePAM (KLONOPIN)  tablet 0.5 mg  0.5 mg Oral BID Caren Griffins, MD   0.5 mg at 01/31/21 0847  . docusate sodium (COLACE) capsule 100 mg  100 mg Oral BID Caren Griffins, MD   100 mg at 01/31/21 0846  . FLUoxetine (PROZAC) capsule 80 mg  80 mg Oral Daily Caren Griffins, MD   80 mg at 01/31/21 0847  . Ipratropium-Albuterol (COMBIVENT) respimat 1 puff  1 puff Inhalation Q6H Opyd, Ilene Qua, MD   1 puff at 01/31/21 0848  . metoprolol succinate (TOPROL-XL) 24 hr tablet 12.5 mg  12.5 mg Oral Daily Caren Griffins, MD   12.5 mg at 01/31/21 0847  . ondansetron (ZOFRAN) tablet 4 mg  4 mg Oral Q6H PRN Opyd, Ilene Qua, MD       Or  . ondansetron (ZOFRAN) injection 4 mg  4 mg Intravenous Q6H PRN Opyd, Ilene Qua, MD      . pantoprazole (PROTONIX) EC tablet 40 mg  40 mg Oral QHS Gherghe, Costin M, MD      . potassium chloride (KLOR-CON) CR tablet 10 mEq  10 mEq Oral Once Caren Griffins, MD      . rosuvastatin (CRESTOR) tablet 20 mg  20 mg Oral Daily Caren Griffins, MD   20 mg at 01/31/21 0848  . senna-docusate (Senokot-S) tablet 1 tablet  1 tablet Oral QHS PRN Opyd, Ilene Qua, MD      . traZODone (DESYREL) tablet 100 mg  100 mg Oral QHS Caren Griffins, MD      . Warfarin - Pharmacist Dosing Inpatient   Does not apply q1600 Opyd, Ilene Qua, MD         Discharge Medications: Please see discharge summary for a list of discharge medications.  Relevant Imaging Results:  Relevant Lab Results:   Additional Information SSN: [id number]  Glennon Hamilton, Student-Social Work

## 2021-02-02 LAB — CULTURE, BLOOD (ROUTINE X 2)
Culture: NO GROWTH
Culture: NO GROWTH

## 2021-02-03 DIAGNOSIS — Z20828 Contact with and (suspected) exposure to other viral communicable diseases: Secondary | ICD-10-CM | POA: Diagnosis not present

## 2021-02-06 ENCOUNTER — Other Ambulatory Visit: Payer: Self-pay

## 2021-02-06 ENCOUNTER — Encounter (HOSPITAL_COMMUNITY): Payer: Self-pay | Admitting: Internal Medicine

## 2021-02-06 ENCOUNTER — Emergency Department (HOSPITAL_COMMUNITY): Payer: Medicare HMO

## 2021-02-06 ENCOUNTER — Observation Stay (HOSPITAL_COMMUNITY): Payer: Medicare HMO

## 2021-02-06 ENCOUNTER — Inpatient Hospital Stay (HOSPITAL_COMMUNITY)
Admission: EM | Admit: 2021-02-06 | Discharge: 2021-02-11 | DRG: 194 | Disposition: A | Discharge status: Skilled Nursing Facility | Payer: Medicare HMO | Source: Skilled Nursing Facility | Attending: Internal Medicine | Admitting: Internal Medicine

## 2021-02-06 DIAGNOSIS — J439 Emphysema, unspecified: Secondary | ICD-10-CM | POA: Diagnosis present

## 2021-02-06 DIAGNOSIS — J159 Unspecified bacterial pneumonia: Principal | ICD-10-CM | POA: Diagnosis present

## 2021-02-06 DIAGNOSIS — I69320 Aphasia following cerebral infarction: Secondary | ICD-10-CM | POA: Diagnosis not present

## 2021-02-06 DIAGNOSIS — I639 Cerebral infarction, unspecified: Secondary | ICD-10-CM | POA: Diagnosis present

## 2021-02-06 DIAGNOSIS — Z7982 Long term (current) use of aspirin: Secondary | ICD-10-CM | POA: Diagnosis not present

## 2021-02-06 DIAGNOSIS — N1330 Unspecified hydronephrosis: Secondary | ICD-10-CM | POA: Diagnosis not present

## 2021-02-06 DIAGNOSIS — I428 Other cardiomyopathies: Secondary | ICD-10-CM | POA: Diagnosis present

## 2021-02-06 DIAGNOSIS — I5042 Chronic combined systolic (congestive) and diastolic (congestive) heart failure: Secondary | ICD-10-CM | POA: Diagnosis present

## 2021-02-06 DIAGNOSIS — J189 Pneumonia, unspecified organism: Secondary | ICD-10-CM | POA: Diagnosis present

## 2021-02-06 DIAGNOSIS — I5020 Unspecified systolic (congestive) heart failure: Secondary | ICD-10-CM | POA: Diagnosis present

## 2021-02-06 DIAGNOSIS — I130 Hypertensive heart and chronic kidney disease with heart failure and stage 1 through stage 4 chronic kidney disease, or unspecified chronic kidney disease: Secondary | ICD-10-CM | POA: Diagnosis not present

## 2021-02-06 DIAGNOSIS — Z91030 Bee allergy status: Secondary | ICD-10-CM

## 2021-02-06 DIAGNOSIS — Z885 Allergy status to narcotic agent status: Secondary | ICD-10-CM | POA: Diagnosis not present

## 2021-02-06 DIAGNOSIS — I513 Intracardiac thrombosis, not elsewhere classified: Secondary | ICD-10-CM | POA: Diagnosis not present

## 2021-02-06 DIAGNOSIS — I429 Cardiomyopathy, unspecified: Secondary | ICD-10-CM

## 2021-02-06 DIAGNOSIS — M2550 Pain in unspecified joint: Secondary | ICD-10-CM | POA: Diagnosis not present

## 2021-02-06 DIAGNOSIS — L89151 Pressure ulcer of sacral region, stage 1: Secondary | ICD-10-CM | POA: Diagnosis present

## 2021-02-06 DIAGNOSIS — Z888 Allergy status to other drugs, medicaments and biological substances status: Secondary | ICD-10-CM

## 2021-02-06 DIAGNOSIS — U099 Post covid-19 condition, unspecified: Secondary | ICD-10-CM | POA: Diagnosis present

## 2021-02-06 DIAGNOSIS — I252 Old myocardial infarction: Secondary | ICD-10-CM

## 2021-02-06 DIAGNOSIS — N261 Atrophy of kidney (terminal): Secondary | ICD-10-CM | POA: Diagnosis present

## 2021-02-06 DIAGNOSIS — J9611 Chronic respiratory failure with hypoxia: Secondary | ICD-10-CM | POA: Diagnosis not present

## 2021-02-06 DIAGNOSIS — Z79899 Other long term (current) drug therapy: Secondary | ICD-10-CM

## 2021-02-06 DIAGNOSIS — Z7901 Long term (current) use of anticoagulants: Secondary | ICD-10-CM | POA: Diagnosis not present

## 2021-02-06 DIAGNOSIS — L8990 Pressure ulcer of unspecified site, unspecified stage: Secondary | ICD-10-CM | POA: Insufficient documentation

## 2021-02-06 DIAGNOSIS — R0602 Shortness of breath: Secondary | ICD-10-CM | POA: Diagnosis not present

## 2021-02-06 DIAGNOSIS — Z87891 Personal history of nicotine dependence: Secondary | ICD-10-CM | POA: Diagnosis not present

## 2021-02-06 DIAGNOSIS — R0902 Hypoxemia: Secondary | ICD-10-CM | POA: Diagnosis not present

## 2021-02-06 DIAGNOSIS — Z5181 Encounter for therapeutic drug level monitoring: Secondary | ICD-10-CM | POA: Diagnosis not present

## 2021-02-06 DIAGNOSIS — N1832 Chronic kidney disease, stage 3b: Secondary | ICD-10-CM | POA: Diagnosis not present

## 2021-02-06 DIAGNOSIS — N132 Hydronephrosis with renal and ureteral calculous obstruction: Secondary | ICD-10-CM | POA: Diagnosis present

## 2021-02-06 DIAGNOSIS — Z811 Family history of alcohol abuse and dependence: Secondary | ICD-10-CM | POA: Diagnosis not present

## 2021-02-06 DIAGNOSIS — J9811 Atelectasis: Secondary | ICD-10-CM | POA: Diagnosis not present

## 2021-02-06 DIAGNOSIS — Z66 Do not resuscitate: Secondary | ICD-10-CM | POA: Diagnosis present

## 2021-02-06 DIAGNOSIS — Z7401 Bed confinement status: Secondary | ICD-10-CM | POA: Diagnosis not present

## 2021-02-06 DIAGNOSIS — U071 COVID-19: Secondary | ICD-10-CM | POA: Diagnosis not present

## 2021-02-06 DIAGNOSIS — R791 Abnormal coagulation profile: Secondary | ICD-10-CM | POA: Diagnosis not present

## 2021-02-06 DIAGNOSIS — I499 Cardiac arrhythmia, unspecified: Secondary | ICD-10-CM | POA: Diagnosis not present

## 2021-02-06 DIAGNOSIS — R52 Pain, unspecified: Secondary | ICD-10-CM | POA: Diagnosis not present

## 2021-02-06 DIAGNOSIS — K449 Diaphragmatic hernia without obstruction or gangrene: Secondary | ICD-10-CM | POA: Diagnosis not present

## 2021-02-06 DIAGNOSIS — J449 Chronic obstructive pulmonary disease, unspecified: Secondary | ICD-10-CM | POA: Diagnosis not present

## 2021-02-06 LAB — CBC WITH DIFFERENTIAL/PLATELET
Abs Immature Granulocytes: 0.01 10*3/uL (ref 0.00–0.07)
Basophils Absolute: 0 10*3/uL (ref 0.0–0.1)
Basophils Relative: 1 %
Eosinophils Absolute: 0 10*3/uL (ref 0.0–0.5)
Eosinophils Relative: 1 %
HCT: 41 % (ref 36.0–46.0)
Hemoglobin: 12.5 g/dL (ref 12.0–15.0)
Immature Granulocytes: 0 %
Lymphocytes Relative: 32 %
Lymphs Abs: 1.7 10*3/uL (ref 0.7–4.0)
MCH: 28 pg (ref 26.0–34.0)
MCHC: 30.5 g/dL (ref 30.0–36.0)
MCV: 91.9 fL (ref 80.0–100.0)
Monocytes Absolute: 0.6 10*3/uL (ref 0.1–1.0)
Monocytes Relative: 10 %
Neutro Abs: 3.1 10*3/uL (ref 1.7–7.7)
Neutrophils Relative %: 56 %
Platelets: 202 10*3/uL (ref 150–400)
RBC: 4.46 MIL/uL (ref 3.87–5.11)
RDW: 13.8 % (ref 11.5–15.5)
WBC: 5.5 10*3/uL (ref 4.0–10.5)
nRBC: 0 % (ref 0.0–0.2)

## 2021-02-06 LAB — TROPONIN I (HIGH SENSITIVITY)
Troponin I (High Sensitivity): 9 ng/L (ref <18)
Troponin I (High Sensitivity): 9 ng/L

## 2021-02-06 LAB — COMPREHENSIVE METABOLIC PANEL
ALT: 46 U/L — ABNORMAL HIGH (ref 0–44)
AST: 55 U/L — ABNORMAL HIGH (ref 15–41)
Albumin: 2.7 g/dL — ABNORMAL LOW (ref 3.5–5.0)
Alkaline Phosphatase: 87 U/L (ref 38–126)
Anion gap: 11 (ref 5–15)
BUN: 15 mg/dL (ref 8–23)
CO2: 26 mmol/L (ref 22–32)
Calcium: 8.8 mg/dL — ABNORMAL LOW (ref 8.9–10.3)
Chloride: 104 mmol/L (ref 98–111)
Creatinine, Ser: 0.98 mg/dL (ref 0.44–1.00)
GFR, Estimated: >60 mL/min (ref >60)
Glucose, Bld: 80 mg/dL (ref 70–99)
Potassium: 3.6 mmol/L (ref 3.5–5.1)
Sodium: 141 mmol/L (ref 135–145)
Total Bilirubin: 0.8 mg/dL (ref 0.3–1.2)
Total Protein: 6.7 g/dL (ref 6.5–8.1)

## 2021-02-06 LAB — PROTIME-INR
INR: 1.6 — ABNORMAL HIGH (ref 0.8–1.2)
Prothrombin Time: 18.3 seconds — ABNORMAL HIGH (ref 11.4–15.2)

## 2021-02-06 LAB — D-DIMER, QUANTITATIVE: D-Dimer, Quant: 0.35 ug/mL-FEU (ref 0.00–0.50)

## 2021-02-06 LAB — BRAIN NATRIURETIC PEPTIDE: B Natriuretic Peptide: 41 pg/mL (ref 0.0–100.0)

## 2021-02-06 LAB — LACTIC ACID, PLASMA
Lactic Acid, Venous: 1.1 mmol/L (ref 0.5–1.9)
Lactic Acid, Venous: 1 mmol/L (ref 0.5–1.9)

## 2021-02-06 MED ORDER — ACETAMINOPHEN 650 MG RE SUPP: 650.0000 mg | Freq: Four times a day (QID) | RECTAL | Status: DC | PRN

## 2021-02-06 MED ORDER — TRAZODONE HCL 100 MG PO TABS
100.0000 mg | ORAL_TABLET | Freq: Every day | ORAL | Status: DC
  Administered 2021-02-07 – 2021-02-10 (×4): 100 mg via ORAL
  Filled 2021-02-06: qty 2
  Filled 2021-02-06 (×4): qty 1

## 2021-02-06 MED ORDER — ACETAMINOPHEN 325 MG PO TABS: 650.0000 mg | ORAL_TABLET | Freq: Four times a day (QID) | ORAL | Status: DC | PRN

## 2021-02-06 MED ORDER — CLONAZEPAM 0.5 MG PO TABS
0.5000 mg | ORAL_TABLET | Freq: Two times a day (BID) | ORAL | Status: DC
  Administered 2021-02-07 – 2021-02-11 (×8): 0.5 mg via ORAL
  Filled 2021-02-06 (×9): qty 1

## 2021-02-06 MED ORDER — WARFARIN SODIUM 3 MG PO TABS
3.0000 mg | ORAL_TABLET | Freq: Once | ORAL | Status: AC
  Administered 2021-02-07: 3 mg via ORAL
  Filled 2021-02-06: qty 1

## 2021-02-06 MED ORDER — ALBUTEROL SULFATE (2.5 MG/3ML) 0.083% IN NEBU: 2.5000 mg | INHALATION_SOLUTION | RESPIRATORY_TRACT | Status: DC | PRN

## 2021-02-06 MED ORDER — IOHEXOL 350 MG/ML SOLN
100.0000 mL | Freq: Once | INTRAVENOUS | Status: AC | PRN
  Administered 2021-02-06: 100 mL via INTRAVENOUS

## 2021-02-06 MED ORDER — FLUOXETINE HCL 20 MG PO CAPS
80.0000 mg | ORAL_CAPSULE | Freq: Every day | ORAL | Status: DC
  Administered 2021-02-07 – 2021-02-11 (×5): 80 mg via ORAL
  Filled 2021-02-06 (×5): qty 4

## 2021-02-06 MED ORDER — ROSUVASTATIN CALCIUM 20 MG PO TABS
20.0000 mg | ORAL_TABLET | Freq: Every day | ORAL | Status: DC
  Administered 2021-02-07 – 2021-02-11 (×5): 20 mg via ORAL
  Filled 2021-02-06 (×5): qty 1

## 2021-02-06 MED ORDER — ARFORMOTEROL TARTRATE 15 MCG/2ML IN NEBU
15.0000 ug | INHALATION_SOLUTION | Freq: Two times a day (BID) | RESPIRATORY_TRACT | Status: DC
  Administered 2021-02-07 (×3): 15 ug via RESPIRATORY_TRACT
  Filled 2021-02-06 (×4): qty 2

## 2021-02-06 MED ORDER — HEPARIN (PORCINE) 25000 UT/250ML-% IV SOLN
950.0000 [IU]/h | INTRAVENOUS | Status: AC
  Administered 2021-02-07: 550 [IU]/h via INTRAVENOUS
  Administered 2021-02-08: 750 [IU]/h via INTRAVENOUS
  Administered 2021-02-09: 850 [IU]/h via INTRAVENOUS
  Filled 2021-02-06 (×3): qty 250

## 2021-02-06 MED ORDER — PANTOPRAZOLE SODIUM 40 MG PO TBEC
40.0000 mg | DELAYED_RELEASE_TABLET | Freq: Every day | ORAL | Status: DC
  Administered 2021-02-07 – 2021-02-10 (×4): 40 mg via ORAL
  Filled 2021-02-06 (×5): qty 1

## 2021-02-06 MED ORDER — ASPIRIN EC 325 MG PO TBEC
325.0000 mg | DELAYED_RELEASE_TABLET | Freq: Every day | ORAL | Status: DC
  Administered 2021-02-07: 325 mg via ORAL
  Filled 2021-02-06: qty 1

## 2021-02-06 MED ORDER — METOPROLOL SUCCINATE ER 25 MG PO TB24
12.5000 mg | ORAL_TABLET | Freq: Every day | ORAL | Status: DC
Start: 2021-02-07 — End: 2021-02-11
  Administered 2021-02-07 – 2021-02-11 (×4): 12.5 mg via ORAL
  Filled 2021-02-06 (×5): qty 1

## 2021-02-06 MED ORDER — DOCUSATE SODIUM 100 MG PO CAPS
100.0000 mg | ORAL_CAPSULE | Freq: Two times a day (BID) | ORAL | Status: DC
  Administered 2021-02-07 – 2021-02-11 (×7): 100 mg via ORAL
  Filled 2021-02-06 (×8): qty 1

## 2021-02-06 MED ORDER — WARFARIN - PHARMACIST DOSING INPATIENT: Freq: Every day | Status: DC

## 2021-02-06 NOTE — ED Notes (Signed)
Pt transported to CT ?

## 2021-02-06 NOTE — Progress Notes (Signed)
ANTICOAGULATION CONSULT NOTE - Initial Consult  Pharmacy Consult for Heparin + Warfarin Indication: LV thrombus  Allergies  Allergen Reactions  . Bee Venom Anaphylaxis and Hives  . Capsicum (Cayenne) [Cayenne] Swelling  . Morphine Nausea And Vomiting  . Topiramate Hives    Patient Measurements:     Vital Signs: Temp: 97.7 F (36.5 C) (02/10 2223) Temp Source: Oral (02/10 2223) BP: 112/73 (02/10 2215) Pulse Rate: 59 (02/10 2225)  Labs: Recent Labs    02/06/21 1827 02/06/21 1939  HGB 12.5  --   HCT 41.0  --   PLT 202  --   LABPROT 18.3*  --   INR 1.6*  --   CREATININE 0.98  --   TROPONINIHS 9 9    Estimated Creatinine Clearance: 33.1 mL/min (by C-G formula based on SCr of 0.98 mg/dL).  Assessment: 71 YOF who presented on 2/10 with SOB/hypoxia. The patient was reportedly on warfarin PTA for hx LV thrombus and cardio embolic CVA. Note patient has a remote history of a hemorrhagic CVA. D-dimer low, getting CTA to r/o PE.   Last admission - it was recommended to discharge the patient on 2.5 mg daily. Med reconciliation not completed yet this admission. Admit INR 1.6. Pharmacy consulted to resume Warfarin dosing. Discussed with MD and plan is to start a heparin bridge without a bolus while INR<2.  Attempted to contacted SNF ([facility]) without a response - it is unclear if the patient has taken her warfarin today or not. Hep Wt: 38.2 kg  Goal of Therapy:  INR 2-3 Heparin level 0.3-0.5 units/ml Monitor platelets by anticoagulation protocol: Yes   Plan:  - Start Heparin at 550 units/hr (5 ml/hr) - Warfarin 3 mg x 1 dose today - Daily PT/INR, CBC q72h - Will continue to monitor for any signs/symptoms of bleeding and will follow up with PT/INR in the a.m.    Thank you for allowing pharmacy to be a part of this patient's care.  Alycia Rossetti, PharmD, BCPS Clinical Pharmacist Clinical phone for 02/06/2021: [phone] 02/06/2021 11:04 PM   **Pharmacist  phone directory can now be found on [email or web address] ([email or web address]).  Listed under [place].

## 2021-02-06 NOTE — ED Notes (Signed)
Pt oxygen dropped to 89% with a good pleth. Placed pt on 2L [place] with improvement to 93%. Primary RN aware.

## 2021-02-06 NOTE — ED Triage Notes (Signed)
Pt bib [employer] from [place] with reports of intermittent low oxygen saturations. Pt recently dx on 01/15/21 with Covid. Pt oxygen 94% on room air on arrival. Per FEMA, staff at facility state pt is at her baseline. Pt will awake to verbal stimuli but has been non verbal.

## 2021-02-06 NOTE — ED Provider Notes (Signed)
[facility] EMERGENCY DEPARTMENT Provider Note   CSN: [id number] Arrival date & time: 02/06/21  1617     History No chief complaint on file.   Mindy Mills is a 70 y.o. female.  The history is provided by medical records and the EMS personnel (ems report to nursing). The history is limited by the condition of the patient.  Shortness of Breath Severity:  Moderate Timing:  Constant Progression:  Unchanged Chronicity:  New Context: URI (recent covid)   Relieved by:  Oxygen Worsened by:  Nothing Ineffective treatments:  None tried Associated symptoms: no abdominal pain, no chest pain, no cough, no fever, no headaches and no vomiting   Risk factors: hx of cancer   Risk factors: no hx of PE/DVT        Past Medical History:  Diagnosis Date  . Cancer ([place])    Cervicle  . Full dentures   . Hepatitis C    denies  . Hypertension   . Migraine   . Myocardial infarction ([place])   . Stroke [facility]) 2015   Pt reports hx of CVA x3.  Last one in 2015  . Wears glasses   . Wears hearing aid in both ears     Patient Active Problem List   Diagnosis Date Noted  . Acute renal failure superimposed on stage 3b chronic kidney disease ([place]) 01/28/2021  . Chronic combined systolic and diastolic CHF (congestive heart failure) ([place]) 01/28/2021  . SIRS (systemic inflammatory response syndrome) ([place]) 01/28/2021  . Mural thrombus of cardiac apex 01/28/2021  . COVID-19 virus infection 01/28/2021  . Hyperkalemia 01/28/2021  . Chronic respiratory failure with hypoxia ([place])   . Cardioembolic stroke ([place]) 02/63/7858  . Protein-calorie malnutrition, severe 12/12/2020  . Pneumonia of right lower lobe due to infectious organism   . Acute systolic CHF (congestive heart failure) ([place]) 12/09/2020  . Emphysema/COPD ([place]) 12/09/2020  . Aspiration pneumonia ([place]) 12/07/2020  . Elevated troponin 12/07/2020  . Closed fracture of right distal radius 09/07/2018  . Fall 09/08/2015  . UTI  (lower urinary tract infection) 09/08/2015  . Dehydration 09/08/2015  . Sepsis ([place]) 09/08/2015  . Systolic heart failure ([place]) 09/08/2015  . Confusion 09/08/2015  . TIA (transient ischemic attack) 09/08/2015  . Acute on chronic respiratory failure ([place]) 09/24/2014  . Tracheostomy status ([place]) 09/22/2014  . Demand ischemia of myocardium: in setting of CVA 09/17/2014  . 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 Subarachnoid hemorrhage due to ruptured aneurysm ([place]) 09/11/2014  . Acute respiratory failure ([place]) 09/11/2014  . Hypokalemia 09/11/2014  . Altered mental status 09/11/2014  . Subarachnoid hemorrhage ([place]) 09/11/2014    Past Surgical History:  Procedure Laterality Date  . ABDOMINAL HYSTERECTOMY    . BUNIONECTOMY    . COLONOSCOPY    . MULTIPLE TOOTH EXTRACTIONS    . OPEN REDUCTION INTERNAL FIXATION (ORIF) DISTAL RADIAL FRACTURE Right 09/07/2018   Procedure: OPEN REDUCTION INTERNAL FIXATION (ORIF) DISTAL RADIAL FRACTURE;  Surgeon: Iran Planas, MD;  Location: [facility];  Service: Orthopedics;  Laterality: Right;  . PEG PLACEMENT N/A 09/21/2014   Procedure: PERCUTANEOUS ENDOSCOPIC GASTROSTOMY (PEG) PLACEMENT (BED SIDE);  Surgeon: Georganna Skeans, MD;  Location: [facility];  Service: General;  Laterality: N/A;  . RADIOLOGY WITH ANESTHESIA N/A 09/11/2014   Procedure: Sub Arachnoid Aneurysm Coiling -RADIOLOGY WITH ANESTHESIA ;  Surgeon: Consuella Lose, MD;  Location: [facility];  Service: Radiology;  Laterality: N/A;  OB History   No obstetric history on file.     Family History  Problem Relation Age of Onset  . Alcohol abuse Father     Social History   Tobacco Use  . Smoking status: Former Smoker    Packs/day: 0.50    Types: Cigarettes    Quit date: 09/06/2015    Years since quitting: 5.4  . Smokeless tobacco: Never Used  Vaping Use  . Vaping Use: Former  Substance Use Topics  . Alcohol use: Not  Currently  . Drug use: No    Home Medications Prior to Admission medications   Medication Sig Start Date End Date Taking? Authorizing Provider  albuterol (PROVENTIL) (2.5 MG/3ML) 0.083% nebulizer solution Take 3 mLs (2.5 mg total) by nebulization every 4 (four) hours as needed for wheezing or shortness of breath. 12/24/20   Pahwani, Michell Heinrich, MD  arformoterol (BROVANA) 15 MCG/2ML NEBU Take 2 mLs (15 mcg total) by nebulization 2 (two) times daily. Patient not taking: Reported on 01/30/2021 12/24/20   Mckinley Jewel, MD  Ascorbic Acid (VITAMIN C) 1000 MG tablet Take 500 mg by mouth in the morning and at bedtime.    [provider]  aspirin 325 MG EC tablet Take 325 mg by mouth daily.    [provider]  budesonide (PULMICORT) 0.5 MG/2ML nebulizer solution Take 2 mLs (0.5 mg total) by nebulization 2 (two) times daily. Patient not taking: Reported on 01/30/2021 12/24/20   Mckinley Jewel, MD  Cholecalciferol (VITAMIN D) 50 MCG (2000 UT) CAPS Take 2,000 Units by mouth daily.    [provider]  clonazePAM (KLONOPIN) 0.5 MG tablet Take 1 tablet (0.5 mg total) by mouth 2 (two) times daily. 01/31/21   Caren Griffins, MD  docusate sodium (COLACE) 100 MG capsule Take 1 capsule (100 mg total) by mouth 2 (two) times daily. 09/09/18   Brynda Peon, PA  EPINEPHrine 0.3 mg/0.3 mL IJ SOAJ injection Inject 0.3 mg into the muscle once as needed (for allergic reaction).    [provider]  feeding supplement, ENSURE ENLIVE, (ENSURE ENLIVE) LIQD Take 237 mLs by mouth daily. 09/11/15   Ghimire, Henreitta Leber, MD  FLUoxetine (PROZAC) 20 MG capsule Take 80 mg by mouth daily.    [provider]  guaiFENesin (MUCINEX) 600 MG 12 hr tablet Take 1 tablet (600 mg total) by mouth 2 (two) times daily. Patient not taking: No sig reported 12/24/20   Pahwani, Michell Heinrich, MD  metoprolol succinate (TOPROL-XL) 25 MG 24 hr tablet Take 0.5 tablets (12.5 mg total) by mouth daily. 12/25/20    Pahwani, Michell Heinrich, MD  pantoprazole (PROTONIX) 40 MG tablet Take 1 tablet (40 mg total) by mouth at bedtime. 09/11/15   Ghimire, Henreitta Leber, MD  rosuvastatin (CRESTOR) 40 MG tablet Take 20 mg by mouth daily.    [provider]  traZODone (DESYREL) 100 MG tablet Take 100 mg by mouth at bedtime.    [provider]  warfarin (COUMADIN) 5 MG tablet Take 0.5 tablets (2.5 mg total) by mouth daily at 4 PM. 01/31/21   Caren Griffins, MD    Allergies    Bee venom, Capsicum (cayenne) [cayenne], Morphine, and Topiramate  Review of Systems   Review of Systems  Unable to perform ROS: Patient nonverbal  Constitutional: Negative for fever.  Respiratory: Positive for shortness of breath. Negative for cough.   Cardiovascular: Negative for chest pain, palpitations and leg swelling.  Gastrointestinal: Negative for abdominal pain,  constipation, diarrhea, nausea and vomiting.  Genitourinary: Negative for dysuria.  Musculoskeletal: Negative for back pain.  Neurological: Negative for headaches.  Psychiatric/Behavioral: Negative for agitation.  All other systems reviewed and are negative.   Physical Exam Updated Vital Signs BP 98/64 (BP Location: Right Arm)   Pulse 63   Temp 98.3 F (36.8 C) (Oral)   Resp 15   SpO2 94%   Physical Exam Vitals and nursing note reviewed.  Constitutional:      General: She is not in acute distress. HENT:     Head: Normocephalic.     Nose: No congestion or rhinorrhea.     Mouth/Throat:     Mouth: Mucous membranes are dry.     Pharynx: No oropharyngeal exudate or posterior oropharyngeal erythema.  Eyes:     Conjunctiva/sclera: Conjunctivae normal.     Pupils: Pupils are equal, round, and reactive to light.  Cardiovascular:     Rate and Rhythm: Normal rate.     Heart sounds: No murmur heard.   Pulmonary:     Effort: Pulmonary effort is normal. No respiratory distress.     Breath sounds: Rhonchi present. No wheezing or rales.  Chest:      Chest wall: No tenderness.  Abdominal:     General: Abdomen is flat.     Tenderness: There is no abdominal tenderness.  Musculoskeletal:        General: No tenderness.     Cervical back: No tenderness.     Right lower leg: No edema.     Left lower leg: No edema.  Skin:    Capillary Refill: Capillary refill takes less than 2 seconds.     Findings: No erythema.  Neurological:     Mental Status: She is alert. Mental status is at baseline.     Motor: Weakness present.  Psychiatric:        Mood and Affect: Mood normal.     ED Results / Procedures / Treatments   Labs (all labs ordered are listed, but only abnormal results are displayed) Labs Reviewed  COMPREHENSIVE METABOLIC PANEL - Abnormal; Notable for the following components:      Result Value   Calcium 8.8 (*)    Albumin 2.7 (*)    AST 55 (*)    ALT 46 (*)    All other components within normal limits  PROTIME-INR - Abnormal; Notable for the following components:   Prothrombin Time 18.3 (*)    INR 1.6 (*)    All other components within normal limits  CBC WITH DIFFERENTIAL/PLATELET  D-DIMER, QUANTITATIVE (NOT AT [facility])  LACTIC ACID, PLASMA  LACTIC ACID, PLASMA  BRAIN NATRIURETIC PEPTIDE  TROPONIN I (HIGH SENSITIVITY)  TROPONIN I (HIGH SENSITIVITY)    EKG EKG Interpretation  Date/Time:  Thursday February 06 2021 18:10:05 EST Ventricular Rate:  59 PR Interval:    QRS Duration: 134 QT Interval:  479 QTC Calculation: 475 R Axis:   77 Text Interpretation: Sinus rhythm Right bundle branch block when compared to prior, similar apperance. No STEMI Confirmed by Antony Blackbird [phone]) on 02/06/2021 6:23:26 PM   Radiology DG Chest Portable 1 View  Result Date: 02/06/2021 CLINICAL DATA:  Recent COVID now experience hypoxia EXAM: PORTABLE CHEST 1 VIEW COMPARISON:  Chest radiograph January 28, 2021 FINDINGS: The heart size and mediastinal contours are unchanged. Hyperinflation with emphysematous and bronchitic change.  Slightly increased bronchial thickening. Retrocardiac hiatal her. Diffuse demineralization of bone. Remote left clavicle fracture. IMPRESSION: Chronic changes of COPD with slightly  increase in bronchial thickening compared to prior, which may represent bronchitis. Electronically Signed   By: Dahlia Bailiff MD   On: 02/06/2021 18:51    Procedures Procedures   Medications Ordered in ED Medications - No data to display  ED Course  I have reviewed the triage vital signs and the nursing notes.  Pertinent labs & imaging results that were available during my care of the patient were reviewed by me and considered in my medical decision making (see chart for details).    MDM Rules/Calculators/A&P                          Mindy Mills is a 70 y.o. female with a past medical history significant for prior remote hemorrhagic stroke, chronic aphasia, left mural thrombus with cardio [place] CVA in December 22 now on Coumadin, dysphagia, CKD, CHF, subarachnoid hemorrhage, COPD not on oxygen, migraines, hypertension, cervical cancer and recent admission for COVID-19 infection/AKI/and UTI discharged 6 days ago who presents with new hypoxia and shortness of breath.  According to documentation from facility, patient has had shortness of breath today with oxygen saturations lower than baseline.  She does not take oxygen normally.  On arrival to the emergency room, oxygen saturations were found to be in the 80s on room air.  Patient was placed on 2 L with improvement into the 90s.  Patient will shake her head yes and no to some answers but she is aphasic at baseline reportedly.  Patient denied any pain shaking her head no.  She did report the shortness of breath.  She took her had no further complaints after are listed any chest pain, palpitations, nausea, vomiting, urinary, or GI symptoms.  On exam, lungs have some coarseness.  Chest is nontender and abdomen is nontender.  She has some mild weakness in her right  leg and right arm compared to left and had aphasia.   no signs of acute trauma.  EKG showed no STEMI.  Given her new hypoxia, I do anticipate she will require admission but we need to rule out if it is a new post Covid pneumonia versus fluid overload given her heart failure, versus even pulmonary embolism while on Coumadin.  We will get labs including Coumadin level.  As she just had COVID, we will not recheck her.  Anticipate admission after work-up is completed.   8:47 PM Work-up has begun to return. No leukocytosis or anemia. Metabolic panel shows normal kidney function with slight increase in liver enzymes. D-dimer surprisingly nonelevated.. Troponin negative. Lactic acid not elevated. Chest x-ray shows chronic changes of COPD with increased bronchial thickening possibly bronchitis.  Due to her new hypoxia on room air which she is used to, I do feel she needs admission. Will discuss with admitting team about getting a noncontrasted or contrasted CT scan now that the D-dimer is negative and we feel comfortable that she does not have a PE. Clinically I suspect her COVID is likely exacerbated her chronic lung disease. She was not having any wheezing and only had some rhonchi initially. Lower suspicion for COPD exacerbation and will discuss with admitting team further management.  Medicine team agreed with holding on antibiotics and getting a CT scan to further evaluate.  CT PE study was ordered.  Patient will be admitted for further management   Final Clinical Impression(s) / ED Diagnoses Final diagnoses:  Hypoxia     Clinical Impression: 1. Hypoxia     Disposition:  Admit  This note was prepared with assistance of Dragon voice recognition software. Occasional wrong-word or sound-a-like substitutions may have occurred due to the inherent limitations of voice recognition software.     Domenic Schoenberger, Gwenyth Allegra, MD 02/06/21 2217

## 2021-02-06 NOTE — H&P (Signed)
History and Physical    Mindy Mills DQQ:[id number] DOB: 09-18-1951 DOA: 02/06/2021  PCP: Patient, No Pcp Per  Patient coming from: Skilled nursing facility.  Chief Complaint: Hypoxia.  HPI: Mindy Mills is a 70 y.o. 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  There is no mention of any productive cough fever chills or chest pain.  Patient has speech difficulty with history of stroke.  ED Course: In the ER patient was not hypoxic but chest x-ray was showing features concerning for bronchitis.  CT angiogram of the chest was done which shows consolidation concerning for worsening pneumonia and patient admitted for further observation.  BNP high sensitive troponins were negative along with lactic acid.  Review of Systems: As per HPI, rest all negative.   Past Medical History:  Diagnosis Date  . Cancer ([place])    Cervicle  . Full dentures   . Hepatitis C    denies  . Hypertension   . Migraine   . Myocardial infarction ([place])   . Stroke [facility]) 2015   Pt reports hx of CVA x3.  Last one in 2015  . Wears glasses   . Wears hearing aid in both ears     Past Surgical History:  Procedure Laterality Date  . ABDOMINAL HYSTERECTOMY    . BUNIONECTOMY    . COLONOSCOPY    . MULTIPLE TOOTH EXTRACTIONS    . OPEN REDUCTION INTERNAL FIXATION (ORIF) DISTAL RADIAL FRACTURE Right 09/07/2018   Procedure: OPEN REDUCTION INTERNAL FIXATION (ORIF) DISTAL RADIAL FRACTURE;  Surgeon: Iran Planas, MD;  Location: [place];  Service: Orthopedics;  Laterality: Right;  . PEG PLACEMENT N/A 09/21/2014   Procedure: PERCUTANEOUS ENDOSCOPIC GASTROSTOMY (PEG) PLACEMENT (BED SIDE);  Surgeon: Georganna Skeans, MD;  Location: [place];  Service: General;  Laterality: N/A;  . RADIOLOGY WITH ANESTHESIA N/A 09/11/2014   Procedure: Sub Arachnoid Aneurysm Coiling -RADIOLOGY WITH ANESTHESIA ;  Surgeon: Consuella Lose, MD;  Location: [facility];  Service: Radiology;  Laterality: N/A;     reports that she quit smoking about 5 years ago. Her smoking use included cigarettes. She smoked 0.50 packs per day. She has never used smokeless tobacco. She reports previous alcohol use. She reports that she does not use drugs.  Allergies  Allergen Reactions  . Bee Venom Anaphylaxis and Hives  . Capsicum (Cayenne) [Cayenne] Swelling  . Morphine Nausea And Vomiting  . Topiramate Hives    Family History  Problem Relation Age of Onset  . Alcohol abuse Father     Prior to Admission medications   Medication Sig Start Date End Date Taking? Authorizing Provider  albuterol (PROVENTIL) (2.5 MG/3ML) 0.083% nebulizer solution Take 3 mLs (2.5 mg total) by nebulization every 4 (four) hours as needed for wheezing or shortness of breath. 12/24/20   Pahwani, Michell Heinrich, MD  arformoterol (BROVANA) 15 MCG/2ML NEBU Take 2 mLs (15 mcg total) by nebulization 2 (two) times daily. Patient not taking: Reported on 01/30/2021 12/24/20   Mckinley Jewel, MD  Ascorbic Acid (VITAMIN C) 1000 MG tablet Take 500 mg by mouth in the morning and at bedtime.    [provider]  aspirin 325 MG EC tablet Take 325 mg by mouth daily.  [provider]  budesonide (PULMICORT) 0.5 MG/2ML nebulizer solution Take 2 mLs (0.5 mg total) by nebulization 2 (two) times daily. Patient not taking: Reported on 01/30/2021 12/24/20   Mckinley Jewel, MD  Cholecalciferol (VITAMIN D) 50 MCG (2000 UT) CAPS Take 2,000 Units by mouth daily.    [provider]  clonazePAM (KLONOPIN) 0.5 MG tablet Take 1 tablet (0.5 mg total) by mouth 2 (two) times daily. 01/31/21   Caren Griffins, MD  docusate sodium (COLACE) 100 MG capsule Take 1 capsule (100 mg total) by mouth 2  (two) times daily. 09/09/18   Brynda Peon, PA  EPINEPHrine 0.3 mg/0.3 mL IJ SOAJ injection Inject 0.3 mg into the muscle once as needed (for allergic reaction).    [provider]  feeding supplement, ENSURE ENLIVE, (ENSURE ENLIVE) LIQD Take 237 mLs by mouth daily. 09/11/15   Ghimire, Henreitta Leber, MD  FLUoxetine (PROZAC) 20 MG capsule Take 80 mg by mouth daily.    [provider]  guaiFENesin (MUCINEX) 600 MG 12 hr tablet Take 1 tablet (600 mg total) by mouth 2 (two) times daily. Patient not taking: No sig reported 12/24/20   Pahwani, Michell Heinrich, MD  metoprolol succinate (TOPROL-XL) 25 MG 24 hr tablet Take 0.5 tablets (12.5 mg total) by mouth daily. 12/25/20   Pahwani, Michell Heinrich, MD  pantoprazole (PROTONIX) 40 MG tablet Take 1 tablet (40 mg total) by mouth at bedtime. 09/11/15   Ghimire, Henreitta Leber, MD  rosuvastatin (CRESTOR) 40 MG tablet Take 20 mg by mouth daily.    [provider]  traZODone (DESYREL) 100 MG tablet Take 100 mg by mouth at bedtime.    [provider]  warfarin (COUMADIN) 5 MG tablet Take 0.5 tablets (2.5 mg total) by mouth daily at 4 PM. 01/31/21   Caren Griffins, MD    Physical Exam: Constitutional: Moderately built and nourished. Vitals:   02/06/21 2215 02/06/21 2223 02/06/21 2224 02/06/21 2225  BP: 112/73     Pulse: 60  (!) 57 (!) 59  Resp: 11  11 17   Temp:  97.7 F (36.5 C)    TempSrc:  Oral    SpO2: 90%  93% 98%   Eyes: Anicteric no pallor. ENMT: No discharge from the ears eyes nose or mouth. Neck: No mass felt.  No neck rigidity. Respiratory: No rhonchi or crepitations. Cardiovascular: S1-S2 heard. Abdomen: Soft nontender bowel sounds present. Musculoskeletal: No edema. Skin: No rash. Neurologic: Alert awake has speech difficulties following commands moving all extremities. Psychiatric: Alert awake.  Follows commands.   Labs on Admission: I have personally reviewed following labs and imaging studies  CBC: Recent  Labs  Lab 01/31/21 0056 02/06/21 1827  WBC 16.0* 5.5  NEUTROABS 12.4* 3.1  HGB 12.1 12.5  HCT 38.3 41.0  MCV 89.9 91.9  PLT 315 371   Basic Metabolic Panel: Recent Labs  Lab 01/31/21 0056 02/06/21 1827  NA 146* 141  K 3.9 3.6  CL 111 104  CO2 25 26  GLUCOSE 108* 80  BUN 26* 15  CREATININE 1.01* 0.98  CALCIUM 9.2 8.8*   GFR: Estimated Creatinine Clearance: 33.1 mL/min (by C-G formula based on SCr of 0.98 mg/dL). Liver Function Tests: Recent Labs  Lab 01/31/21 0056 02/06/21 1827  AST 16 55*  ALT 13 46*  ALKPHOS 95 87  BILITOT 0.7 0.8  PROT 6.6 6.7  ALBUMIN 2.6* 2.7*   No results for input(s): LIPASE, AMYLASE in the last 168 hours. No results  for input(s): AMMONIA in the last 168 hours. Coagulation Profile: Recent Labs  Lab 01/31/21 0056 02/06/21 1827  INR 2.9* 1.6*   Cardiac Enzymes: No results for input(s): CKTOTAL, CKMB, CKMBINDEX, TROPONINI in the last 168 hours. BNP (last 3 results) No results for input(s): PROBNP in the last 8760 hours. HbA1C: No results for input(s): HGBA1C in the last 72 hours. CBG: No results for input(s): GLUCAP in the last 168 hours. Lipid Profile: No results for input(s): CHOL, HDL, LDLCALC, TRIG, CHOLHDL, LDLDIRECT in the last 72 hours. Thyroid Function Tests: No results for input(s): TSH, T4TOTAL, FREET4, T3FREE, THYROIDAB in the last 72 hours. Anemia Panel: No results for input(s): VITAMINB12, FOLATE, FERRITIN, TIBC, IRON, RETICCTPCT in the last 72 hours. Urine analysis:    Component Value Date/Time   COLORURINE YELLOW 01/29/2021 1613   APPEARANCEUR CLOUDY (A) 01/29/2021 1613   LABSPEC 1.024 01/29/2021 1613   PHURINE 5.0 01/29/2021 1613   GLUCOSEU NEGATIVE 01/29/2021 1613   HGBUR NEGATIVE 01/29/2021 1613   BILIRUBINUR NEGATIVE 01/29/2021 1613   KETONESUR 5 (A) 01/29/2021 1613   PROTEINUR NEGATIVE 01/29/2021 1613   UROBILINOGEN 1.0 09/08/2015 1817   NITRITE NEGATIVE 01/29/2021 1613   LEUKOCYTESUR SMALL (A)  01/29/2021 1613   Sepsis Labs: @LABRCNTIP (procalcitonin:4,lacticidven:4) ) Recent Results (from the past 240 hour(s))  SARS Coronavirus 2 by RT PCR (hospital order, performed in [place] hospital lab) Nasopharyngeal Nasopharyngeal Swab     Status: Abnormal   Collection Time: 01/28/21  6:42 PM   Specimen: Nasopharyngeal Swab  Result Value Ref Range Status   SARS Coronavirus 2 POSITIVE (A) NEGATIVE Final    Comment: RESULT CALLED TO, READ BACK BY AND VERIFIED WITH: RN MOLLY  C [id number] FCP (NOTE) SARS-CoV-2 target nucleic acids are DETECTED  SARS-CoV-2 RNA is generally detectable in upper respiratory specimens  during the acute phase of infection.  Positive results are indicative  of the presence of the identified virus, but do not rule out bacterial infection or co-infection with other pathogens not detected by the test.  Clinical correlation with patient history and  other diagnostic information is necessary to determine patient infection status.  The expected result is negative.  Fact Sheet for Patients:   StrictlyIdeas.no   Fact Sheet for Healthcare Providers:   BankingDealers.co.za    This test is not yet approved or cleared by the [place] FDA and  has been authorized for detection and/or diagnosis of SARS-CoV-2 by FDA under an Emergency Use Authorization (EUA).  This EUA will remain in effect (meaning this test can  be used) for the duration of  the COVID-19 declaration under Section 564(b)(1) of the Act, 21 U.S.C. section 360-bbb-3(b)(1), unless the authorization is terminated or revoked sooner.  Performed at [facility], [place] [address] [place], [place] [address]   Blood Culture (routine x 2)     Status: None   Collection Time: 01/28/21  7:00 PM   Specimen: BLOOD  Result Value Ref Range Status   Specimen Description BLOOD SITE NOT SPECIFIED  Final   Special Requests   Final    BOTTLES DRAWN AEROBIC  AND ANAEROBIC Blood Culture results may not be optimal due to an inadequate volume of blood received in culture bottles   Culture   Final    NO GROWTH 5 DAYS Performed at [facility], [place] [address], [place], [place] [address]    Report Status 02/02/2021 FINAL  Final  Blood Culture (routine x 2)  Status: None   Collection Time: 01/28/21  7:10 PM   Specimen: BLOOD  Result Value Ref Range Status   Specimen Description BLOOD SITE NOT SPECIFIED  Final   Special Requests   Final    BOTTLES DRAWN AEROBIC AND ANAEROBIC Blood Culture results may not be optimal due to an inadequate volume of blood received in culture bottles   Culture   Final    NO GROWTH 5 DAYS Performed at [facility], [place] [address] [place], [place] [address]    Report Status 02/02/2021 FINAL  Final     Radiological Exams on Admission: DG Chest Portable 1 View  Result Date: 02/06/2021 CLINICAL DATA:  Recent COVID now experience hypoxia EXAM: PORTABLE CHEST 1 VIEW COMPARISON:  Chest radiograph January 28, 2021 FINDINGS: The heart size and mediastinal contours are unchanged. Hyperinflation with emphysematous and bronchitic change. Slightly increased bronchial thickening. Retrocardiac hiatal her. Diffuse demineralization of bone. Remote left clavicle fracture. IMPRESSION: Chronic changes of COPD with slightly increase in bronchial thickening compared to prior, which may represent bronchitis. Electronically Signed   By: Dahlia Bailiff MD   On: 02/06/2021 18:51    EKG: Independently reviewed.  Normal sinus rhythm RBBB.  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 Pneumonia  likely causing intermittent hypoxia recently tested positive for COVID-19 infection last month presently I have placed patient on empiric antibiotics.  Closely monitor. 2. Left sided hydronephrosis with proximal ureter stone and atrophy of the left kidney -discussed with on-call urologist Dr. Everlena Cooper who advised patient to follow-up as outpatient and will be setting it up for further work-up. 3. History of cardioembolic CVA with LV thrombus on Coumadin.  Since INR is subtherapeutic Heparin was started after discussing with pharmacy without a bolus. 4. Please history of intracranial hemorrhage. 5. History of CHF appears compensated. 6. History of emphysema.  Not actively wheezing.  Continue inhalers. 7. Recently positive for Covid infection last month.   DVT prophylaxis: Heparin/Coumadin. Code Status: DNR. Family Communication: We will need to discuss with family. Disposition Plan: Back to facility when stable. Consults called: Discussed with urologist. Admission status: Observation.   Rise Patience MD [facility] Pager [phone].  If 7PM-7AM, please contact night-coverage [email or web address] Password [facility]  02/06/2021, 10:41 PM

## 2021-02-07 DIAGNOSIS — Z7982 Long term (current) use of aspirin: Secondary | ICD-10-CM | POA: Diagnosis not present

## 2021-02-07 DIAGNOSIS — I639 Cerebral infarction, unspecified: Secondary | ICD-10-CM | POA: Diagnosis not present

## 2021-02-07 DIAGNOSIS — Z87891 Personal history of nicotine dependence: Secondary | ICD-10-CM | POA: Diagnosis not present

## 2021-02-07 DIAGNOSIS — R0902 Hypoxemia: Secondary | ICD-10-CM | POA: Diagnosis not present

## 2021-02-07 DIAGNOSIS — Z7401 Bed confinement status: Secondary | ICD-10-CM | POA: Diagnosis not present

## 2021-02-07 DIAGNOSIS — N132 Hydronephrosis with renal and ureteral calculous obstruction: Secondary | ICD-10-CM | POA: Diagnosis present

## 2021-02-07 DIAGNOSIS — L89151 Pressure ulcer of sacral region, stage 1: Secondary | ICD-10-CM | POA: Diagnosis present

## 2021-02-07 DIAGNOSIS — Z79899 Other long term (current) drug therapy: Secondary | ICD-10-CM | POA: Diagnosis not present

## 2021-02-07 DIAGNOSIS — R0602 Shortness of breath: Secondary | ICD-10-CM | POA: Diagnosis present

## 2021-02-07 DIAGNOSIS — J439 Emphysema, unspecified: Secondary | ICD-10-CM | POA: Diagnosis not present

## 2021-02-07 DIAGNOSIS — I513 Intracardiac thrombosis, not elsewhere classified: Secondary | ICD-10-CM | POA: Diagnosis not present

## 2021-02-07 DIAGNOSIS — Z5181 Encounter for therapeutic drug level monitoring: Secondary | ICD-10-CM | POA: Diagnosis not present

## 2021-02-07 DIAGNOSIS — U071 COVID-19: Secondary | ICD-10-CM | POA: Diagnosis not present

## 2021-02-07 DIAGNOSIS — Z7901 Long term (current) use of anticoagulants: Secondary | ICD-10-CM | POA: Diagnosis not present

## 2021-02-07 DIAGNOSIS — I499 Cardiac arrhythmia, unspecified: Secondary | ICD-10-CM | POA: Diagnosis not present

## 2021-02-07 DIAGNOSIS — R791 Abnormal coagulation profile: Secondary | ICD-10-CM

## 2021-02-07 DIAGNOSIS — Z91030 Bee allergy status: Secondary | ICD-10-CM | POA: Diagnosis not present

## 2021-02-07 DIAGNOSIS — I130 Hypertensive heart and chronic kidney disease with heart failure and stage 1 through stage 4 chronic kidney disease, or unspecified chronic kidney disease: Secondary | ICD-10-CM | POA: Diagnosis present

## 2021-02-07 DIAGNOSIS — J189 Pneumonia, unspecified organism: Secondary | ICD-10-CM | POA: Diagnosis present

## 2021-02-07 DIAGNOSIS — Z888 Allergy status to other drugs, medicaments and biological substances status: Secondary | ICD-10-CM | POA: Diagnosis not present

## 2021-02-07 DIAGNOSIS — N261 Atrophy of kidney (terminal): Secondary | ICD-10-CM | POA: Diagnosis present

## 2021-02-07 DIAGNOSIS — I69320 Aphasia following cerebral infarction: Secondary | ICD-10-CM | POA: Diagnosis not present

## 2021-02-07 DIAGNOSIS — M2550 Pain in unspecified joint: Secondary | ICD-10-CM | POA: Diagnosis not present

## 2021-02-07 DIAGNOSIS — N1832 Chronic kidney disease, stage 3b: Secondary | ICD-10-CM | POA: Diagnosis present

## 2021-02-07 DIAGNOSIS — I252 Old myocardial infarction: Secondary | ICD-10-CM | POA: Diagnosis not present

## 2021-02-07 DIAGNOSIS — R52 Pain, unspecified: Secondary | ICD-10-CM | POA: Diagnosis not present

## 2021-02-07 DIAGNOSIS — Z885 Allergy status to narcotic agent status: Secondary | ICD-10-CM | POA: Diagnosis not present

## 2021-02-07 DIAGNOSIS — I5042 Chronic combined systolic (congestive) and diastolic (congestive) heart failure: Secondary | ICD-10-CM | POA: Diagnosis present

## 2021-02-07 DIAGNOSIS — L8990 Pressure ulcer of unspecified site, unspecified stage: Secondary | ICD-10-CM | POA: Insufficient documentation

## 2021-02-07 DIAGNOSIS — J9611 Chronic respiratory failure with hypoxia: Secondary | ICD-10-CM | POA: Diagnosis present

## 2021-02-07 DIAGNOSIS — Z66 Do not resuscitate: Secondary | ICD-10-CM | POA: Diagnosis present

## 2021-02-07 DIAGNOSIS — Z811 Family history of alcohol abuse and dependence: Secondary | ICD-10-CM | POA: Diagnosis not present

## 2021-02-07 DIAGNOSIS — I428 Other cardiomyopathies: Secondary | ICD-10-CM | POA: Diagnosis present

## 2021-02-07 DIAGNOSIS — U099 Post covid-19 condition, unspecified: Secondary | ICD-10-CM | POA: Diagnosis present

## 2021-02-07 DIAGNOSIS — J159 Unspecified bacterial pneumonia: Secondary | ICD-10-CM | POA: Diagnosis present

## 2021-02-07 LAB — CBC
HCT: 41.1 % (ref 36.0–46.0)
Hemoglobin: 12.7 g/dL (ref 12.0–15.0)
MCH: 28.8 pg (ref 26.0–34.0)
MCHC: 30.9 g/dL (ref 30.0–36.0)
MCV: 93.2 fL (ref 80.0–100.0)
Platelets: 217 10*3/uL (ref 150–400)
RBC: 4.41 MIL/uL (ref 3.87–5.11)
RDW: 13.7 % (ref 11.5–15.5)
WBC: 5.7 10*3/uL (ref 4.0–10.5)
nRBC: 0 % (ref 0.0–0.2)

## 2021-02-07 LAB — BASIC METABOLIC PANEL
Anion gap: 11 (ref 5–15)
BUN: 13 mg/dL (ref 8–23)
CO2: 27 mmol/L (ref 22–32)
Calcium: 9.1 mg/dL (ref 8.9–10.3)
Chloride: 104 mmol/L (ref 98–111)
Creatinine, Ser: 0.89 mg/dL (ref 0.44–1.00)
GFR, Estimated: >60 mL/min (ref >60)
Glucose, Bld: 76 mg/dL (ref 70–99)
Potassium: 3.4 mmol/L — ABNORMAL LOW (ref 3.5–5.1)
Sodium: 142 mmol/L (ref 135–145)

## 2021-02-07 LAB — HIV ANTIBODY (ROUTINE TESTING W REFLEX): HIV Screen 4th Generation wRfx: NONREACTIVE

## 2021-02-07 LAB — STREP PNEUMONIAE URINARY ANTIGEN: Strep Pneumo Urinary Antigen: NEGATIVE

## 2021-02-07 LAB — HEPARIN LEVEL (UNFRACTIONATED)
Heparin Unfractionated: <0.1 IU/mL — ABNORMAL LOW (ref 0.30–0.70)
Heparin Unfractionated: 0.32 IU/mL (ref 0.30–0.70)

## 2021-02-07 LAB — MRSA PCR SCREENING: MRSA by PCR: NEGATIVE

## 2021-02-07 LAB — PROCALCITONIN: Procalcitonin: <0.1 ng/mL

## 2021-02-07 LAB — PROTIME-INR
INR: 1.6 — ABNORMAL HIGH (ref 0.8–1.2)
Prothrombin Time: 18.7 seconds — ABNORMAL HIGH (ref 11.4–15.2)

## 2021-02-07 MED ORDER — MOMETASONE FURO-FORMOTEROL FUM 200-5 MCG/ACT IN AERO
2.0000 | INHALATION_SPRAY | Freq: Two times a day (BID) | RESPIRATORY_TRACT | Status: DC
  Administered 2021-02-08 – 2021-02-11 (×5): 2 via RESPIRATORY_TRACT
  Filled 2021-02-07: qty 8.8

## 2021-02-07 MED ORDER — SODIUM CHLORIDE 0.9 % IV SOLN
2.0000 g | Freq: Every day | INTRAVENOUS | Status: AC
  Administered 2021-02-07 – 2021-02-10 (×5): 2 g via INTRAVENOUS
  Filled 2021-02-07 (×5): qty 20

## 2021-02-07 MED ORDER — WARFARIN SODIUM 2.5 MG PO TABS
2.5000 mg | ORAL_TABLET | Freq: Once | ORAL | Status: DC
  Filled 2021-02-07: qty 1

## 2021-02-07 MED ORDER — VANCOMYCIN HCL 500 MG/100ML IV SOLN
500.0000 mg | INTRAVENOUS | Status: DC
  Administered 2021-02-07: 500 mg via INTRAVENOUS
  Filled 2021-02-07: qty 100

## 2021-02-07 MED ORDER — SODIUM CHLORIDE 0.9 % IV SOLN
500.0000 mg | Freq: Every day | INTRAVENOUS | Status: DC
  Administered 2021-02-07 – 2021-02-08 (×3): 500 mg via INTRAVENOUS
  Filled 2021-02-07 (×4): qty 500

## 2021-02-07 MED ORDER — ALBUTEROL SULFATE HFA 108 (90 BASE) MCG/ACT IN AERS
2.0000 | INHALATION_SPRAY | RESPIRATORY_TRACT | Status: DC | PRN
  Filled 2021-02-07: qty 6.7

## 2021-02-07 NOTE — Progress Notes (Signed)
[place] [facility] for Heparin Indication: LV thrombus  Allergies  Allergen Reactions  . Bee Venom Anaphylaxis and Hives    Per MAR  . Capsicum (Cayenne) [Cayenne] Swelling    Per MAR  . Morphine Nausea And Vomiting  . Topiramate Hives    Patient Measurements: Heparin dosing weight - 38.2 kg  Vital Signs: Temp: 98.2 F (36.8 C) (02/11 1622) Temp Source: Axillary (02/11 1427) BP: 88/51 (02/11 1622) Pulse Rate: 71 (02/11 1622)  Labs: Recent Labs    02/06/21 1827 02/06/21 1939 02/07/21 0220 02/07/21 0525 02/07/21 1601  HGB 12.5  --  12.7  --   --   HCT 41.0  --  41.1  --   --   PLT 202  --  217  --   --   LABPROT 18.3*  --  18.7*  --   --   INR 1.6*  --  1.6*  --   --   HEPARINUNFRC  --   --   --  <0.10* 0.32  CREATININE 0.98  --  0.89  --   --   TROPONINIHS 9 9  --   --   --     Estimated Creatinine Clearance: 36.5 mL/min (by C-G formula based on SCr of 0.89 mg/dL).  Assessment: 31 YOF presented 2/10 with SOB/hypoxia. The patient was reportedly on warfarin PTA for hx LV thrombus and cardio embolic CVA. Note, patient has remote history of hemorrhagic CVA. D-dimer wnl. CTA negative for PE.  Pharmacy dosing IV heparin bridge to Coumadin given sub-therapeutic INR.  Heparin level is therapeutic; no bleeding reported.  Goal of Therapy:  INR 2-3 Heparin level 0.3-0.5 units/ml Monitor platelets by anticoagulation protocol: Yes   Plan:  Increase heparin gtt slightly to 750 units/hr F/U AM labs  Keyshla Tunison D. Mina Marble, PharmD, BCPS, [place] 02/07/2021, 5:26 PM

## 2021-02-07 NOTE — Progress Notes (Signed)
[facility] for Heparin + Warfarin Indication: LV thrombus  Allergies  Allergen Reactions  . Bee Venom Anaphylaxis and Hives  . Capsicum (Cayenne) [Cayenne] Swelling  . Morphine Nausea And Vomiting  . Topiramate Hives    Patient Measurements:     Vital Signs: Temp: 97.9 F (36.6 C) (02/11 0633) Temp Source: Oral (02/11 0633) BP: 102/60 (02/11 0630) Pulse Rate: 68 (02/11 0630)  Labs: Recent Labs    02/06/21 1827 02/06/21 1939 02/07/21 0220 02/07/21 0525  HGB 12.5  --  12.7  --   HCT 41.0  --  41.1  --   PLT 202  --  217  --   LABPROT 18.3*  --  18.7*  --   INR 1.6*  --  1.6*  --   HEPARINUNFRC  --   --   --  <0.10*  CREATININE 0.98  --  0.89  --   TROPONINIHS 9 9  --   --     Estimated Creatinine Clearance: 36.5 mL/min (by C-G formula based on SCr of 0.89 mg/dL).  Assessment: 7 YOF who presented on 2/10 with SOB/hypoxia. The patient was reportedly on warfarin PTA for hx LV thrombus and cardio embolic CVA. Note patient has a remote history of a hemorrhagic CVA. D-dimer low, getting CTA to r/o PE.   Last admission - it was recommended to discharge the patient on 2.5 mg daily. Med reconciliation not completed yet this admission. Admit INR 1.6. Pharmacy consulted to resume Warfarin dosing. Discussed with MD and plan is to start a heparin bridge without a bolus while INR<2.  Attempted to contacted SNF ([facility]) without a response - it is unclear if the patient has taken her warfarin today or not. Hep Wt: 38.2 kg  2/11 AM update:  Heparin level low No issues per RN  Goal of Therapy:  INR 2-3 Heparin level 0.3-0.5 units/ml Monitor platelets by anticoagulation protocol: Yes   Plan:  -Inc heparin to 700 units/hr -1500 heparin level  Narda Bonds, PharmD, [place] Pharmacist Phone: [phone]

## 2021-02-07 NOTE — ED Notes (Signed)
Lunch Tray Ordered @ 1030. 

## 2021-02-07 NOTE — ED Notes (Signed)
Breakfast ordered 

## 2021-02-07 NOTE — ED Notes (Signed)
Pt began pulling at O2 probe on fingers and on forehead. This RN moved O2 probe to ear

## 2021-02-07 NOTE — Progress Notes (Signed)
Pharmacy Antibiotic Note  Mindy Mills is a 70 y.o. female admitted on 02/06/2021 with pneumonia.  Pharmacy has been consulted for Vancomycin dosing. WBC WNL. Renal function good.   Plan: Vancomycin 500 mg IV q24h >>Estimated AUC: 434 Ceftriaxone/Azithromycin per MD Trend WBC, temp, renal function  F/U infectious work-up Drug levels as indicated  Temp (24hrs), Avg:97.9 F (36.6 C), Min:97.6 F (36.4 C), Max:98.3 F (36.8 C)  Recent Labs  Lab 02/06/21 1827 02/06/21 1939 02/07/21 0220  WBC 5.5  --  5.7  CREATININE 0.98  --   --   LATICACIDVEN 1.1 1.0  --     Estimated Creatinine Clearance: 33.1 mL/min (by C-G formula based on SCr of 0.98 mg/dL).    Allergies  Allergen Reactions  . Bee Venom Anaphylaxis and Hives  . Capsicum (Cayenne) [Cayenne] Swelling  . Morphine Nausea And Vomiting  . Topiramate Hives    Narda Bonds, PharmD, BCPS Clinical Pharmacist Phone: [phone]

## 2021-02-07 NOTE — ED Notes (Signed)
Patient was able to eat about 50% of breakfast. No issues with swallowing present.

## 2021-02-07 NOTE — Progress Notes (Signed)
PROGRESS NOTE                                                                             PROGRESS NOTE                                                                                                                                                                                                             Patient Demographics:    Mindy Mills, is a 70 y.o. female, DOB - 1951/05/11, GYI:[id number]  Outpatient Primary MD for the patient is Patient, No Pcp Per    LOS - 0  Admit date - 02/06/2021    No chief complaint on file.      Brief Narrative     HPI: FAJR FIFE is a 70 y.o. 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  There is no mention of any productive cough fever chills or chest pain.  Patient has speech difficulty with history of stroke.  ED Course: In the ER patient was not hypoxic but chest x-ray was showing features concerning for bronchitis.  CT angiogram of the chest was done which shows consolidation concerning for worsening pneumonia and patient admitted for further observation.  BNP high sensitive troponins were negative along with lactic acid.   Subjective:    [name] no significant event as discussed with staff, she is 96% on 3 L oxygen .   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 -Continue with antibiotic azithromycin , Rocephin and vancomycin.  Recent COVID-19 infection -Initial diagnosis day 2/1, she has past 10 days isolation yesterday -Her acute presentation are related to bacterial pneumonia, and not due to COVID-19. -No indication for further isolation  Chronic hypoxic respiratory failure -This appears to be at baseline, currently on 2 L oxygen.  Left sided hydronephrosis with proximal ureter stone and atrophy of the left kidney  -Meeting physician discussed with on-call urologist Dr. Everlena Cooper who advised patient to follow-up as outpatient and will be setting it up for further work-up.  History of cardioembolic CVA with LV thrombus on Coumadin.   -INR is subtherapeutic at 1.6, given LV thrombus because recent acute CVA, she will need to be bridged with heparin GTT till INR is therapeutic >2.  Heparin GTT managed by pharmacy .   history of intracranial hemorrhage.  History of CHF appears compensated.  History of emphysema.  Not actively wheezing.  Continue inhalers.    SpO2: 98 % O2 Flow Rate (L/min): 2 L/min  Recent Labs  Lab 02/06/21 1827 02/06/21 1939 02/07/21 0220  WBC 5.5  --  5.7  PLT 202  --  217  BNP 41.0  --   --   DDIMER 0.35  --   --   AST 55*  --   --   ALT 46*  --   --   ALKPHOS 87  --   --   BILITOT 0.8  --   --   ALBUMIN 2.7*  --   --   INR 1.6*  --  1.6*  LATICACIDVEN 1.1 1.0  --        ABG     Component Value Date/Time   PHART 7.483 (H) 09/25/2014 0940   PCO2ART 30.5 (L) 09/25/2014 0940   PO2ART 67.0 (L) 09/25/2014 0940   HCO3 22.8 09/25/2014 0940   TCO2 27 01/28/2021 1947   ACIDBASEDEF 0.4 09/25/2014 0940   O2SAT 95.0 09/25/2014 0940          Condition - Extremely Guarded  Family Communication  :  D/W daughter by phone  Code  Status :  DNR  Consults  :  none Disposition Plan  :    Status is: Observation  The patient will require care spanning > 2 midnights and should be moved to inpatient because: IV treatments appropriate due to intensity of illness or inability to take PO  Dispo: The patient is from: SNF              Anticipated d/c is to: SNF              Anticipated d/c date is: 2 days              Patient currently is not medically stable to d/c.  Patient was up therapeutic INR thrombus, at high risk for recurrent CVA due to that, need to be on heparin GTT till her INR is therapeutic> 2.   difficult to place patient No      DVT Prophylaxis  : Bridged with heparin GTT till INR>2.  Lab Results  Component Value Date   PLT 217 02/07/2021    Diet :  Diet Order            Diet Heart Room service appropriate? Yes; Fluid  consistency: Thin  Diet effective now                  Inpatient Medications  Scheduled Meds: . arformoterol  15 mcg Nebulization BID  . aspirin  325 mg Oral Daily  . clonazePAM  0.5 mg Oral BID  . docusate sodium  100 mg Oral BID  . FLUoxetine  80 mg Oral Daily  . metoprolol succinate  12.5 mg Oral Daily  . pantoprazole  40 mg Oral QHS  . rosuvastatin  20 mg Oral Daily  . traZODone  100 mg Oral QHS  . warfarin  2.5 mg Oral ONCE-1600  . Warfarin - Pharmacist Dosing Inpatient   Does not apply q1600   Continuous Infusions: . azithromycin Stopped (02/07/21 0630)  . cefTRIAXone (ROCEPHIN)  IV Stopped (02/07/21 0516)  . heparin 700 Units/hr (02/07/21 0646)  . vancomycin Stopped (02/07/21 0630)   PRN Meds:.acetaminophen **OR** acetaminophen, albuterol  Antibiotics  :    Anti-infectives (From admission, onward)   Start     Dose/Rate Route Frequency Ordered Stop   02/07/21 0400  vancomycin (VANCOREADY) IVPB 500 mg/100 mL        500 mg 100 mL/hr over 60 Minutes Intravenous Every 24 hours 02/07/21 0334     02/07/21 0330  cefTRIAXone (ROCEPHIN) 2 g in sodium chloride  0.9 % 100 mL IVPB        2 g 200 mL/hr over 30 Minutes Intravenous Daily at bedtime 02/07/21 0325 02/11/21 2159   02/07/21 0330  azithromycin (ZITHROMAX) 500 mg in sodium chloride 0.9 % 250 mL IVPB        500 mg 250 mL/hr over 60 Minutes Intravenous Daily at bedtime 02/07/21 0325 02/11/21 2159       Emeline Gins Medea Deines M.D on 02/07/2021 at 2:53 PM  To page go to [email or web address]   [facility] -  Office  [phone]       Objective:   Vitals:   02/07/21 1130 02/07/21 1400 02/07/21 1415 02/07/21 1427  BP: (!) 94/57 (!) 98/55    Pulse: 70 68 69   Resp: 14 18 19    Temp:    97.6 F (36.4 C)  TempSrc:    Axillary  SpO2: 97% 95% 98%     Wt Readings from Last 3 Encounters:  01/31/21 38.2 kg  12/23/20 42.2 kg  10/10/20 48 kg    No intake or output data in the 24 hours ending 02/07/21 1453   Physical Exam  Awake Alert, he is with significant speech difficulty, but following commands unable to move all extremities Symmetrical Chest wall movement, Good air movement bilaterally, CTAB RRR,No Gallops,Rubs or new Murmurs, No Parasternal Heave +ve B.Sounds, Abd Soft, No tenderness,  No rebound - guarding or rigidity. No Cyanosis, Clubbing or edema, No new Rash or bruise     Data Review:    CBC Recent Labs  Lab 02/06/21 1827 02/07/21 0220  WBC 5.5 5.7  HGB 12.5 12.7  HCT 41.0 41.1  PLT 202 217  MCV 91.9 93.2  MCH 28.0 28.8  MCHC 30.5 30.9  RDW 13.8 13.7  LYMPHSABS 1.7  --   MONOABS 0.6  --   EOSABS 0.0  --   BASOSABS 0.0  --     Recent Labs  Lab 02/06/21 1827 02/06/21 1939 02/07/21 0220  NA 141  --  142  K 3.6  --  3.4*  CL 104  --  104  CO2 26  --  27  GLUCOSE 80  --  76  BUN 15  --  13  CREATININE 0.98  --  0.89  CALCIUM 8.8*  --  9.1  AST 55*  --   --   ALT 46*  --   --   ALKPHOS 87  --   --   BILITOT 0.8  --   --   ALBUMIN 2.7*  --   --   DDIMER 0.35  --   --   LATICACIDVEN 1.1 1.0  --   INR 1.6*  --  1.6*  BNP 41.0  --   --      ------------------------------------------------------------------------------------------------------------------ No results for input(s): CHOL, HDL, LDLCALC, TRIG, CHOLHDL, LDLDIRECT in the last 72 hours.  Lab Results  Component Value Date   HGBA1C 5.2 12/16/2020   ------------------------------------------------------------------------------------------------------------------ No results for input(s): TSH, T4TOTAL, T3FREE, THYROIDAB in the last 72 hours.  Invalid input(s): FREET3  Cardiac Enzymes No results for input(s): CKMB, TROPONINI, MYOGLOBIN in the last 168 hours.  Invalid input(s): CK ------------------------------------------------------------------------------------------------------------------    Component Value Date/Time   BNP 41.0 02/06/2021 1827    Micro Results Recent Results (from the past 240 hour(s))  SARS Coronavirus 2 by RT PCR (hospital order, performed in [facility] hospital lab) Nasopharyngeal Nasopharyngeal Swab     Status: Abnormal   Collection Time: 01/28/21  6:42 PM   Specimen: Nasopharyngeal Swab  Result Value Ref Range Status   SARS Coronavirus 2 POSITIVE (A) NEGATIVE Final    Comment: RESULT CALLED TO, READ BACK BY AND VERIFIED WITH: RN MOLLY  C [id number] FCP (NOTE) SARS-CoV-2 target nucleic acids are DETECTED  SARS-CoV-2 RNA is generally detectable in upper respiratory specimens  during the acute phase of infection.  Positive results are indicative  of the presence of the identified virus, but do not rule out bacterial infection or co-infection with other pathogens not detected by the test.  Clinical correlation with patient history and  other diagnostic information is necessary to determine patient infection status.  The expected result is negative.  Fact Sheet for Patients:   StrictlyIdeas.no   Fact Sheet for Healthcare Providers:   BankingDealers.co.za    This test is not yet  approved or cleared by the [place] FDA and  has been authorized for detection and/or diagnosis of SARS-CoV-2 by FDA under an Emergency Use Authorization (EUA).  This EUA will remain in effect (meaning this test can  be used) for the duration of  the COVID-19 declaration under Section 564(b)(1) of the Act, 21 U.S.C. section 360-bbb-3(b)(1), unless the authorization is terminated or revoked sooner.  Performed at [facility], [place] [address] [place], [place] [address]   Blood Culture (routine x 2)     Status: None   Collection Time: 01/28/21  7:00 PM   Specimen: BLOOD  Result Value Ref Range Status   Specimen Description BLOOD SITE NOT SPECIFIED  Final   Special Requests   Final    BOTTLES DRAWN AEROBIC AND ANAEROBIC Blood Culture results may not be optimal due to an inadequate volume of blood received in culture bottles   Culture   Final    NO GROWTH 5 DAYS Performed at [facility], [place] [address], [place], [place] [address]    Report Status 02/02/2021 FINAL  Final  Blood Culture (routine x 2)     Status: None   Collection Time: 01/28/21  7:10 PM   Specimen: BLOOD  Result Value Ref Range Status   Specimen Description BLOOD SITE NOT SPECIFIED  Final   Special Requests   Final    BOTTLES DRAWN AEROBIC AND ANAEROBIC Blood Culture results may not be optimal due to an inadequate volume of blood received in culture bottles   Culture   Final    NO GROWTH 5 DAYS Performed at [facility], [place] [address], [place], [place] [address]    Report Status 02/02/2021 FINAL  Final  MRSA PCR Screening     Status: None   Collection Time: 02/07/21  3:26 AM   Specimen: Nasal Mucosa; Nasopharyngeal  Result Value Ref Range Status   MRSA by PCR NEGATIVE NEGATIVE Final    Comment:        The GeneXpert MRSA Assay (FDA approved for NASAL specimens only), is one component of a comprehensive MRSA colonization surveillance program. It is not intended to diagnose MRSA infection  nor to guide or monitor treatment for MRSA infections. Performed at [facility], [place] [address], [place], [place] [address]     Radiology Reports CT HEAD WO CONTRAST  Result Date: 01/30/2021 CLINICAL DATA:  Unwitnessed fall EXAM: CT HEAD WITHOUT CONTRAST TECHNIQUE: Contiguous axial images were obtained from the base of the skull through the vertex without intravenous contrast. COMPARISON:  12/14/2020 FINDINGS: Brain: Large remote left MCA branch infarct affecting the frontal lobe and insula with expected evolution from prior. Small chronic infarct along the high right frontal parietal junction. Confluent chronic small vessel ischemia in the cerebral white matter. No evidence of acute infarct, hemorrhage, hydrocephalus, or swelling. Chronic small vessel ischemic gliosis in the cerebral white matter Vascular: Atherosclerosis and A-comm region aneurysm coiling Skull: Negative for fracture. Sinuses/Orbits: Negative IMPRESSION: 1. No evidence of intracranial injury. 2. Aneurysm coiling and chronic ischemic injury as described. Electronically Signed   By: Monte Fantasia M.D.   On: 01/30/2021 05:00   CT Angio Chest PE W and/or Wo Contrast  Result Date: 02/07/2021 CLINICAL DATA:  Recent coronavirus infection. Shortness of breath. Assess for pneumonia versus pulmonary emboli. EXAM: CT ANGIOGRAPHY CHEST WITH CONTRAST TECHNIQUE: Multidetector CT imaging of the chest was performed using the standard protocol during bolus administration of intravenous contrast. Multiplanar CT image reconstructions and MIPs were obtained to evaluate the vascular anatomy. CONTRAST:  195mL OMNIPAQUE IOHEXOL 350 MG/ML SOLN COMPARISON:  Radiography same day.  CT chest 12/07/2020. FINDINGS: Cardiovascular: Heart size is normal. Coronary artery calcification and aortic atherosclerotic calcification are present. Pulmonary arterial opacification is excellent. There are no pulmonary emboli. Mediastinum/Nodes: No mediastinal or hilar  mass or lymphadenopathy. Large hiatal hernia is present. Lungs/Pleura: Background pattern of emphysema. Left lung is clear except for minimal atelectasis at the base. On the right, there is consolidation/pneumonia within the right lower lobe inferiorly. Few small areas of peripheral patchy density in posterior right upper lobe consistent with minimal pneumonia in that location. These findings have worsened since December. Upper Abdomen: Chronic left renal atrophy and hydronephrosis. Obstructing stone in the proximal left ureter measuring 14 mm in size. Musculoskeletal: Ordinary chronic degenerative changes of the thoracic spine. Old minimal superior endplate depressions at T5 and T11, unchanged. Review of the MIP images confirms the above findings. IMPRESSION: 1. No pulmonary emboli. 2. Background pattern of emphysema. 3. Right lower lobe consolidation/pneumonia, worsened since December. Minimal pneumonia in the posterior inferior right upper lobe. 4. Coronary artery calcification and aortic atherosclerotic calcification. 5. Large hiatal hernia. 6. Chronic left renal atrophy and hydronephrosis. Obstructing stone in the proximal left ureter measuring 14 mm in size. 7. Emphysema and  aortic atherosclerosis. Aortic Atherosclerosis (ICD10-I70.0) and Emphysema (ICD10-J43.9). Electronically Signed   By: Nelson Chimes M.D.   On: 02/07/2021 00:03   DG Chest Portable 1 View  Result Date: 02/06/2021 CLINICAL DATA:  Recent COVID now experience hypoxia EXAM: PORTABLE CHEST 1 VIEW COMPARISON:  Chest radiograph January 28, 2021 FINDINGS: The heart size and mediastinal contours are unchanged. Hyperinflation with emphysematous and bronchitic change. Slightly increased bronchial thickening. Retrocardiac hiatal her. Diffuse demineralization of bone. Remote left clavicle fracture. IMPRESSION: Chronic changes of COPD with slightly increase in bronchial thickening compared to prior, which may represent bronchitis. Electronically  Signed   By: Dahlia Bailiff MD   On: 02/06/2021 18:51   DG Chest Port 1 View  Result Date: 01/28/2021 CLINICAL DATA:  Questionable sepsis - evaluate for abnormality lethargy. Decreased responsiveness. EXAM: PORTABLE CHEST 1 VIEW COMPARISON:  Radiograph and CT 12/07/2020. FINDINGS: Emphysema with chronic hyperinflation. Improved bronchial thickening from prior exam. Stable heart size and mediastinal contours. Aortic atherosclerosis. Retrocardiac hiatal hernia. No acute airspace disease. No pneumothorax or large pleural effusion. No pulmonary edema. Remote left clavicle fracture. Bones are diffusely under mineralized. Patient is rotated. IMPRESSION: Emphysema with chronic hyperinflation. Improved bronchial thickening from prior exam. No acute airspace disease. Electronically Signed   By: Keith Rake M.D.   On: 01/28/2021 19:13

## 2021-02-07 NOTE — ED Notes (Signed)
Called patient placement to request new bed assignment with no isolation requirements due to recent COVID infection (outside isolation window). Bed request submitted.

## 2021-02-07 NOTE — ED Notes (Signed)
Neb treatment started. No other needs at this time.

## 2021-02-07 NOTE — ED Notes (Addendum)
Dr. Waldron [facility] notified via secure chat of pt BP. No further orders at this time.

## 2021-02-07 NOTE — Progress Notes (Signed)
Handoff received from Sammuel Bailiff, RN.

## 2021-02-07 NOTE — ED Notes (Signed)
Patient resting quietly in bed at this time. NAD present. No needs identified.

## 2021-02-07 NOTE — ED Notes (Signed)
Elgergawy, MD at bedside. Pt does not need to be on COVID isolation.

## 2021-02-07 NOTE — ED Notes (Signed)
Attempted report x 1. Was told [facility] doesn't know who is taking the pt yet and that they don't even know if she is going to [facility] even though it has been approved. Notified CN. Will attempt report again in 15 min.

## 2021-02-07 NOTE — Progress Notes (Signed)
[facility] for Heparin + Warfarin Indication: LV thrombus  Allergies  Allergen Reactions  . Bee Venom Anaphylaxis and Hives  . Capsicum (Cayenne) [Cayenne] Swelling  . Morphine Nausea And Vomiting  . Topiramate Hives    Patient Measurements:   Heparin dosing weight - 38.2 kg  Vital Signs: Temp: 97.9 F (36.6 C) (02/11 0633) Temp Source: Oral (02/11 0633) BP: 109/56 (02/11 0650) Pulse Rate: 68 (02/11 0630)  Labs: Recent Labs    02/06/21 1827 02/06/21 1939 02/07/21 0220 02/07/21 0525  HGB 12.5  --  12.7  --   HCT 41.0  --  41.1  --   PLT 202  --  217  --   LABPROT 18.3*  --  18.7*  --   INR 1.6*  --  1.6*  --   HEPARINUNFRC  --   --   --  <0.10*  CREATININE 0.98  --  0.89  --   TROPONINIHS 9 9  --   --     Estimated Creatinine Clearance: 36.5 mL/min (by C-G formula based on SCr of 0.89 mg/dL).  Assessment: 30 YOF presented 2/10 with SOB/hypoxia. The patient was reportedly on warfarin PTA for hx LV thrombus and cardio embolic CVA. Note, patient has remote history of hemorrhagic CVA. D-dimer wnl. CTA negative for PE.  Last admission - recommended to discharge patient on 2.5 mg daily. Med reconciliation not completed yet this admission. Admit INR 1.6. Pharmacy consulted to resume Warfarin dosing. Discussed with MD and plan is to start heparin bridge without bolus while INR<2.  Attempted to contacted SNF 2/10 ([facility]) without a response - unclear if the patient took warfarin day of admission or not.  INR remains subtherapeutic at 1.6 today. CBC wnl. No active bleed issues reported. Noted DDI with azithromycin (1st dose received 2/11, ordered x 5 days) - may cause INR to increase.  Goal of Therapy:  INR 2-3 Heparin level 0.3-0.5 units/ml Monitor platelets by anticoagulation protocol: Yes   Plan:  Continue heparin at 700 units/hr F/u heparin level pending for 1500 Warfarin 2.5mg  x 1 dose at 1600 Monitor daily  INR/CBC, s/sx bleeding D/c heparin when INR therapeutic - watch trend with DDI on azithromycin   Arturo Morton, PharmD, BCPS Please check AMION for all [facility] contact numbers Clinical Pharmacist 02/07/2021 8:13 AM

## 2021-02-07 NOTE — ED Notes (Signed)
Lab to do urinalysis

## 2021-02-08 DIAGNOSIS — I513 Intracardiac thrombosis, not elsewhere classified: Secondary | ICD-10-CM

## 2021-02-08 DIAGNOSIS — I639 Cerebral infarction, unspecified: Secondary | ICD-10-CM | POA: Diagnosis not present

## 2021-02-08 DIAGNOSIS — R0602 Shortness of breath: Secondary | ICD-10-CM | POA: Diagnosis not present

## 2021-02-08 LAB — CBC
HCT: 40.8 % (ref 36.0–46.0)
Hemoglobin: 13.3 g/dL (ref 12.0–15.0)
MCH: 30.5 pg (ref 26.0–34.0)
MCHC: 32.6 g/dL (ref 30.0–36.0)
MCV: 93.6 fL (ref 80.0–100.0)
Platelets: 133 10*3/uL — ABNORMAL LOW (ref 150–400)
RBC: 4.36 MIL/uL (ref 3.87–5.11)
RDW: 13.9 % (ref 11.5–15.5)
WBC: 9.5 10*3/uL (ref 4.0–10.5)
nRBC: 0 % (ref 0.0–0.2)

## 2021-02-08 LAB — BASIC METABOLIC PANEL
Anion gap: 15 (ref 5–15)
BUN: 13 mg/dL (ref 8–23)
CO2: 22 mmol/L (ref 22–32)
Calcium: 8.7 mg/dL — ABNORMAL LOW (ref 8.9–10.3)
Chloride: 108 mmol/L (ref 98–111)
Creatinine, Ser: 1.04 mg/dL — ABNORMAL HIGH (ref 0.44–1.00)
GFR, Estimated: 59 mL/min — ABNORMAL LOW (ref >60)
Glucose, Bld: 85 mg/dL (ref 70–99)
Potassium: 3.3 mmol/L — ABNORMAL LOW (ref 3.5–5.1)
Sodium: 145 mmol/L (ref 135–145)

## 2021-02-08 LAB — PROTIME-INR
INR: 1.8 — ABNORMAL HIGH (ref 0.8–1.2)
Prothrombin Time: 20 seconds — ABNORMAL HIGH (ref 11.4–15.2)

## 2021-02-08 LAB — HEPARIN LEVEL (UNFRACTIONATED): Heparin Unfractionated: 0.49 IU/mL (ref 0.30–0.70)

## 2021-02-08 MED ORDER — POTASSIUM CHLORIDE CRYS ER 20 MEQ PO TBCR
40.0000 meq | EXTENDED_RELEASE_TABLET | Freq: Once | ORAL | Status: AC
  Administered 2021-02-08: 40 meq via ORAL
  Filled 2021-02-08: qty 2

## 2021-02-08 MED ORDER — WARFARIN SODIUM 2.5 MG PO TABS
2.5000 mg | ORAL_TABLET | Freq: Once | ORAL | Status: AC
  Administered 2021-02-08: 2.5 mg via ORAL
  Filled 2021-02-08: qty 1

## 2021-02-08 NOTE — TOC Progression Note (Addendum)
Transition of Care [facility]) - Progression Note    Patient Details  Name: Mindy Mills MRN: [id number] Date of Birth: 10-17-1951  Transition of Care [facility]) CM/SW [place], [place] Phone Number: 02/08/2021, 2:56 PM  Clinical Narrative:     CSW was advised by MD that pt would be able to DC back to [facility] on 2/13. [facility] noted they could take her back tomorrow. Dtr, Erasmo Downer, is agreeable with this plan. SW will follow for DC planning.       Expected Discharge Plan and Services                                                 Social Determinants of Health (SDOH) Interventions    Readmission Risk Interventions No flowsheet data found.

## 2021-02-08 NOTE — Progress Notes (Signed)
ANTICOAGULATION CONSULT NOTE  Pharmacy Consult for Heparin + Warfarin Indication: LV thrombus    Patient Measurements:   Heparin dosing weight - 38.2 kg  Vital Signs: Temp: 97.9 F (36.6 C) (02/12 0441) Temp Source: Oral (02/12 0441) BP: 116/71 (02/12 0441) Pulse Rate: 57 (02/12 0441)  Labs: Recent Labs    02/06/21 1827 02/06/21 1939 02/07/21 0220 02/07/21 0525 02/07/21 1601 02/08/21 0500 02/08/21 0655  HGB 12.5  --  12.7  --   --  13.3  --   HCT 41.0  --  41.1  --   --  40.8  --   PLT 202  --  217  --   --  133*  --   LABPROT 18.3*  --  18.7*  --   --   --  20.0*  INR 1.6*  --  1.6*  --   --   --  1.8*  HEPARINUNFRC  --   --   --  <0.10* 0.32  --  0.49  CREATININE 0.98  --  0.89  --   --  1.04*  --   TROPONINIHS 9 9  --   --   --   --   --     Estimated Creatinine Clearance: 31.2 mL/min (A) (by C-G formula based on SCr of 1.04 mg/dL (H)).  Assessment: 67 YOF presented 2/10 with SOB/hypoxia. The patient was reportedly on warfarin PTA for hx LV thrombus and cardio embolic CVA. Note, patient has remote history of hemorrhagic CVA. D-dimer wnl. CTA negative for PE.  Last admission - recommended to discharge patient on 2.5 mg daily. Med reconciliation not completed yet this admission. Admit INR 1.6. Pharmacy consulted to resume Warfarin dosing.   INR remains subtherapeutic at 1.8 today after 3mg  x1 on 2/11, though increased from 1.6. CBC wnl. No active bleed issues reported. Noted DDI with azithromycin (1st dose received 2/11, ordered x 5 days) - may cause INR to increase. HL this am is 0.49 which is at goal. Scr has increased overnight to 1.04, so monitor for affect on clearance.   Goal of Therapy:  INR 2-3 Heparin level 0.3-0.5 units/ml Monitor platelets by anticoagulation protocol: Yes   Plan:  Continue heparin at 750 units/hr Warfarin 2.5mg  x 1 dose at 1600 Monitor daily INR/CBC, s/sx bleeding D/c heparin when INR therapeutic - watch trend with DDI on  azithromycin  Cephus Slater, PharmD, [place] Resident [phone] 02/08/2021 7:45 AM

## 2021-02-08 NOTE — Progress Notes (Signed)
Pt very combative, and pulling at IV's, already removed an IV earlier in which the IV team was called to replace it. Pt on heparin drip and has two IVABT's to be given. Pt keeps hitting nurse's hands and removing mittens that were placed for protection, Dr was called for restraint orders and same was given. Will cont to monitor.

## 2021-02-08 NOTE — Progress Notes (Signed)
PROGRESS NOTE                                                                             PROGRESS NOTE                                                                                                                                                                                                             Patient Demographics:    Mindy Mills, is a 70 y.o. female, DOB - Sep 20, 1951, TDH:[id number]  Outpatient Primary MD for the patient is Patient, No Pcp Per    LOS - 1  Admit date - 02/06/2021    No chief complaint on file.      Brief Narrative     HPI: Mindy Mills is a 70 y.o. 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  There is no mention of any productive cough fever chills or chest pain.  Patient has speech difficulty with history of stroke.  ED Course: In the ER patient was not hypoxic but chest x-ray was showing features concerning for bronchitis.  CT angiogram of the chest was done which shows consolidation concerning for worsening pneumonia and patient admitted for further observation.  BNP high sensitive troponins were negative along with lactic acid.   Subjective:    Mindy Mills patient is nonverbal at baseline, she denies any complaints, she had some confusion overnight.      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 -Empirically on azithromycin, Rocephin and vancomycin, her MRSA is negative, so vancomycin will be discontinued.  Recent COVID-19 infection -Initial diagnosis day 2/1, she has past 10 days isolation yesterday -Her acute presentation are related to bacterial pneumonia, and not due to COVID-19. -No indication for further isolation  Chronic hypoxic respiratory failure -This appears to be at baseline, currently on 2 L oxygen.  Left sided hydronephrosis with proximal ureter stone and atrophy of the left kidney  -Meeting physician discussed with on-call urologist Dr. Everlena Cooper who advised patient to follow-up as outpatient and will be setting it up for further work-up.  History of cardioembolic CVA with LV thrombus on Coumadin.   -INR is subtherapeutic at 1.6, given LV thrombus because recent acute CVA, she will need to be bridged with heparin GTT till INR is therapeutic >2.  Heparin GTT managed by pharmacy .  INR is 1.8 today, will continue with heparin GTT till it is>2. -Patient on aspirin 325 mg oral daily as well, I have discussed with the stroke team Dr. Leonie Man who saw patient during recent admission, at this point recommendation is for warfarin only given her LV thrombus, and no indication for aspirin especially in the history of SAH.   history of intracranial hemorrhage.  History of CHF appears compensated.  History of emphysema.  Not actively wheezing.  Continue inhalers.    SpO2: 93 % O2 Flow Rate (L/min): 2 L/min  Recent Labs  Lab 02/06/21 1827 02/06/21 1939 02/07/21 0220 02/07/21 1601 02/08/21 0500 02/08/21 0655  WBC 5.5  --  5.7  --  9.5  --   PLT 202  --  217  --  133*  --   BNP 41.0  --   --   --   --   --   DDIMER 0.35  --   --    --   --   --   PROCALCITON  --   --   --  <0.10  --   --   AST 55*  --   --   --   --   --   ALT 46*  --   --   --   --   --   ALKPHOS 87  --   --   --   --   --   BILITOT 0.8  --   --   --   --   --   ALBUMIN 2.7*  --   --   --   --   --   INR 1.6*  --  1.6*  --   --  1.8*  LATICACIDVEN 1.1 1.0  --   --   --   --        ABG     Component Value Date/Time   PHART 7.483 (H) 09/25/2014 0940   PCO2ART 30.5 (L) 09/25/2014 0940   PO2ART 67.0 (L) 09/25/2014 0940   HCO3 22.8 09/25/2014 0940   TCO2 27 01/28/2021 1947   ACIDBASEDEF 0.4 09/25/2014 0940   O2SAT 95.0 09/25/2014 0940          Condition - Extremely Guarded  Family Communication  :  D/W daughter by phone 2/11  Code Status :  DNR  Consults  :  none Disposition Plan  :    Status is: INPATIENT  The patient will require care spanning > 2 midnights and should be moved to inpatient because: IV treatments appropriate due to intensity of illness or inability to take PO  Dispo: The patient is from: SNF              Anticipated d/c is to: SNF              Anticipated d/c date is: 2 days              Patient currently is not medically stable to d/c.  Patient was up therapeutic INR thrombus, at high risk for recurrent CVA due to that, need to be on heparin GTT till her INR is therapeutic> 2.   difficult to place patient No      DVT Prophylaxis  : Bridged with heparin GTT till INR>2.  Lab Results  Component Value Date   PLT 133 (L) 02/08/2021    Diet :  Diet Order            Diet Heart Room service appropriate? Yes; Fluid consistency: Thin  Diet effective now                  Inpatient Medications  Scheduled Meds: . clonazePAM  0.5 mg Oral BID  . docusate sodium  100 mg Oral BID  . FLUoxetine  80 mg Oral Daily  . metoprolol succinate  12.5 mg Oral Daily  . mometasone-formoterol  2 puff Inhalation BID  . pantoprazole  40 mg Oral QHS  . rosuvastatin  20 mg Oral Daily  . traZODone  100 mg Oral QHS   . warfarin  2.5 mg Oral ONCE-1600  . Warfarin - Pharmacist Dosing Inpatient   Does not apply q1600   Continuous Infusions: . azithromycin 500 mg (02/08/21 0043)  . cefTRIAXone (ROCEPHIN)  IV 2 g (02/07/21 2330)  . heparin 750 Units/hr (02/07/21 1858)   PRN Meds:.acetaminophen **OR** acetaminophen, albuterol  Antibiotics  :    Anti-infectives (From admission, onward)   Start     Dose/Rate Route Frequency Ordered Stop   02/07/21 0400  vancomycin (VANCOREADY) IVPB 500 mg/100 mL  Status:  Discontinued        500 mg 100 mL/hr over 60 Minutes Intravenous Every 24 hours 02/07/21 0334 02/07/21 1646   02/07/21 0330  cefTRIAXone (ROCEPHIN) 2 g in sodium chloride 0.9 % 100 mL IVPB        2 g 200 mL/hr over 30 Minutes Intravenous Daily at bedtime 02/07/21 0325 02/11/21 2159   02/07/21 0330  azithromycin (ZITHROMAX) 500 mg in sodium chloride 0.9 % 250 mL IVPB        500 mg 250 mL/hr over 60 Minutes Intravenous Daily at bedtime 02/07/21 0325 02/11/21 2159       Emeline Gins Lisel Siegrist M.D on 02/08/2021 at 12:04 PM  To page go to [email or web address]   [facility] -  Office  [phone]       Objective:   Vitals:   02/07/21 1622 02/07/21 2112 02/08/21 0441 02/08/21 0804  BP: (!) 88/51 98/70 116/71 111/64  Pulse: 71 66 (!) 57 65  Resp: 20 17 20 18   Temp: 98.2 F (36.8 C) 98.5 F (36.9 C) 97.9 F (36.6 C) 98 F (36.7 C)  TempSrc:  Oral Oral Oral  SpO2: 90% (!) 88%  93%    Wt Readings from Last 3 Encounters:  01/31/21 38.2 kg  12/23/20 42.2 kg  10/10/20 48 kg     Intake/Output Summary (Last 24 hours) at 02/08/2021 1204 Last data filed at 02/08/2021 0400 Gross per 24 hour  Intake 868.57 ml  Output 200 ml  Net 668.57 ml     Physical Exam  Awake Alert, he is with significant speech difficulty, but following commands unable to move all extremities Symmetrical Chest wall movement, Good air movement bilaterally, CTAB RRR,No Gallops,Rubs or new Murmurs, No Parasternal  Heave +ve B.Sounds, Abd Soft, No tenderness, No rebound - guarding or rigidity. No Cyanosis, Clubbing or edema, No new Rash or bruise       Data Review:    CBC Recent Labs  Lab 02/06/21 1827 02/07/21 0220 02/08/21 0500  WBC 5.5 5.7 9.5  HGB 12.5 12.7 13.3  HCT 41.0 41.1 40.8  PLT 202 217 133*  MCV 91.9 93.2 93.6  MCH 28.0 28.8 30.5  MCHC 30.5 30.9 32.6  RDW 13.8 13.7 13.9  LYMPHSABS 1.7  --   --   MONOABS 0.6  --   --   EOSABS 0.0  --   --   BASOSABS 0.0  --   --     Recent Labs  Lab 02/06/21 1827 02/06/21 1939 02/07/21 0220 02/07/21 1601 02/08/21 0500 02/08/21 0655  NA 141  --  142  --  145  --   K 3.6  --  3.4*  --  3.3*  --   CL 104  --  104  --  108  --   CO2 26  --  27  --  22  --   GLUCOSE 80  --  76  --  85  --   BUN 15  --  13  --  13  --   CREATININE 0.98  --  0.89  --  1.04*  --   CALCIUM 8.8*  --  9.1  --  8.7*  --   AST 55*  --   --   --   --   --   ALT 46*  --   --   --   --   --   ALKPHOS 87  --   --   --   --   --   BILITOT 0.8  --   --   --   --   --   ALBUMIN 2.7*  --   --   --   --   --   DDIMER 0.35  --   --   --   --   --   PROCALCITON  --   --   --  <0.10  --   --   LATICACIDVEN 1.1 1.0  --   --   --   --   INR 1.6*  --  1.6*  --   --  1.8*  BNP 41.0  --   --   --   --   --     ------------------------------------------------------------------------------------------------------------------ No results for input(s): CHOL, HDL, LDLCALC, TRIG, CHOLHDL, LDLDIRECT in the last 72 hours.  Lab Results  Component Value Date   HGBA1C 5.2 12/16/2020   ------------------------------------------------------------------------------------------------------------------ No results for input(s): TSH, T4TOTAL, T3FREE, THYROIDAB in the last 72 hours.  Invalid input(s): FREET3  Cardiac Enzymes No results for input(s): CKMB, TROPONINI, MYOGLOBIN in the last 168 hours.  Invalid input(s):  CK ------------------------------------------------------------------------------------------------------------------    Component Value Date/Time   BNP 41.0 02/06/2021 1827    Micro Results Recent Results (from the past 240 hour(s))  MRSA PCR Screening     Status: None   Collection Time: 02/07/21  3:26 AM   Specimen: Nasal Mucosa; Nasopharyngeal  Result Value Ref Range Status   MRSA by PCR NEGATIVE NEGATIVE Final    Comment:        The GeneXpert MRSA Assay (FDA approved for NASAL specimens only), is one component of a comprehensive MRSA colonization surveillance program. It is not intended to diagnose MRSA infection nor to guide or monitor treatment for MRSA infections. Performed at [facility], [place] [address], [place], [place] [address]     Radiology Reports CT HEAD WO CONTRAST  Result Date: 01/30/2021 CLINICAL DATA:  Unwitnessed fall EXAM: CT HEAD WITHOUT CONTRAST TECHNIQUE: Contiguous axial images were obtained from the base of the skull through the vertex without intravenous contrast. COMPARISON:  12/14/2020 FINDINGS: Brain: Large remote left MCA branch infarct affecting the frontal lobe and insula with expected evolution from prior. Small chronic infarct along the high right frontal parietal junction. Confluent chronic small vessel ischemia in the cerebral white matter. No evidence of acute infarct, hemorrhage, hydrocephalus, or swelling. Chronic small vessel ischemic gliosis in the cerebral white matter Vascular: Atherosclerosis and A-comm region aneurysm coiling Skull: Negative for fracture. Sinuses/Orbits: Negative IMPRESSION: 1. No evidence of intracranial injury. 2. Aneurysm coiling and chronic ischemic injury as described. Electronically Signed   By: Monte Fantasia M.D.   On: 01/30/2021 05:00   CT Angio Chest PE W and/or Wo Contrast  Result Date: 02/07/2021 CLINICAL DATA:  Recent coronavirus infection. Shortness of breath. Assess for pneumonia versus pulmonary  emboli. EXAM: CT ANGIOGRAPHY CHEST WITH CONTRAST TECHNIQUE: Multidetector CT imaging of the chest was performed using the standard protocol during bolus administration of intravenous contrast. Multiplanar CT image reconstructions and MIPs were obtained to evaluate the vascular anatomy. CONTRAST:  167mL OMNIPAQUE IOHEXOL 350 MG/ML SOLN COMPARISON:  Radiography same day.  CT chest 12/07/2020. FINDINGS: Cardiovascular: Heart size is normal. Coronary artery calcification and aortic atherosclerotic calcification are present. Pulmonary arterial opacification is excellent. There are no pulmonary emboli. Mediastinum/Nodes: No mediastinal or hilar mass or lymphadenopathy. Large hiatal hernia is present. Lungs/Pleura: Background pattern of emphysema. Left lung is clear except for minimal atelectasis at the base. On the right, there is consolidation/pneumonia within the right lower lobe inferiorly. Few small areas of peripheral patchy density in posterior right upper lobe consistent with minimal pneumonia in that location. These findings have worsened since December. Upper Abdomen: Chronic left renal atrophy and hydronephrosis. Obstructing stone in the proximal left ureter measuring 14 mm in size. Musculoskeletal: Ordinary chronic degenerative changes of the thoracic spine. Old minimal superior endplate depressions at T5 and T11, unchanged. Review of the MIP images confirms the above findings. IMPRESSION: 1. No pulmonary emboli. 2. Background pattern of emphysema. 3. Right lower lobe consolidation/pneumonia, worsened since December. Minimal pneumonia in the posterior inferior right upper lobe. 4. Coronary artery calcification and aortic atherosclerotic calcification. 5. Large hiatal hernia. 6. Chronic left renal atrophy and hydronephrosis. Obstructing stone in the proximal left ureter measuring 14 mm in size. 7. Emphysema and aortic atherosclerosis. Aortic Atherosclerosis (ICD10-I70.0) and Emphysema (ICD10-J43.9).  Electronically Signed   By: Nelson Chimes M.D.   On: 02/07/2021 00:03   DG Chest Portable 1 View  Result Date: 02/06/2021 CLINICAL DATA:  Recent COVID now experience hypoxia EXAM: PORTABLE CHEST 1 VIEW COMPARISON:  Chest radiograph January 28, 2021 FINDINGS: The heart size and mediastinal contours are unchanged. Hyperinflation with emphysematous and bronchitic change. Slightly increased  bronchial thickening. Retrocardiac hiatal her. Diffuse demineralization of bone. Remote left clavicle fracture. IMPRESSION: Chronic changes of COPD with slightly increase in bronchial thickening compared to prior, which may represent bronchitis. Electronically Signed   By: Dahlia Bailiff MD   On: 02/06/2021 18:51   DG Chest Port 1 View  Result Date: 01/28/2021 CLINICAL DATA:  Questionable sepsis - evaluate for abnormality lethargy. Decreased responsiveness. EXAM: PORTABLE CHEST 1 VIEW COMPARISON:  Radiograph and CT 12/07/2020. FINDINGS: Emphysema with chronic hyperinflation. Improved bronchial thickening from prior exam. Stable heart size and mediastinal contours. Aortic atherosclerosis. Retrocardiac hiatal hernia. No acute airspace disease. No pneumothorax or large pleural effusion. No pulmonary edema. Remote left clavicle fracture. Bones are diffusely under mineralized. Patient is rotated. IMPRESSION: Emphysema with chronic hyperinflation. Improved bronchial thickening from prior exam. No acute airspace disease. Electronically Signed   By: Keith Rake M.D.   On: 01/28/2021 19:13

## 2021-02-09 DIAGNOSIS — R791 Abnormal coagulation profile: Secondary | ICD-10-CM | POA: Diagnosis not present

## 2021-02-09 DIAGNOSIS — I513 Intracardiac thrombosis, not elsewhere classified: Secondary | ICD-10-CM | POA: Diagnosis not present

## 2021-02-09 DIAGNOSIS — I639 Cerebral infarction, unspecified: Secondary | ICD-10-CM | POA: Diagnosis not present

## 2021-02-09 LAB — HEPARIN LEVEL (UNFRACTIONATED)
Heparin Unfractionated: <0.1 IU/mL — ABNORMAL LOW (ref 0.30–0.70)
Heparin Unfractionated: <0.1 IU/mL — ABNORMAL LOW (ref 0.30–0.70)
Heparin Unfractionated: <0.1 IU/mL — ABNORMAL LOW (ref 0.30–0.70)

## 2021-02-09 LAB — CBC
HCT: 35.5 % — ABNORMAL LOW (ref 36.0–46.0)
Hemoglobin: 11.5 g/dL — ABNORMAL LOW (ref 12.0–15.0)
MCH: 28.5 pg (ref 26.0–34.0)
MCHC: 32.4 g/dL (ref 30.0–36.0)
MCV: 87.9 fL (ref 80.0–100.0)
Platelets: 174 10*3/uL (ref 150–400)
RBC: 4.04 MIL/uL (ref 3.87–5.11)
RDW: 14.1 % (ref 11.5–15.5)
WBC: 6.5 10*3/uL (ref 4.0–10.5)
nRBC: 0 % (ref 0.0–0.2)

## 2021-02-09 LAB — BASIC METABOLIC PANEL
Anion gap: 9 (ref 5–15)
BUN: 7 mg/dL — ABNORMAL LOW (ref 8–23)
CO2: 23 mmol/L (ref 22–32)
Calcium: 8.5 mg/dL — ABNORMAL LOW (ref 8.9–10.3)
Chloride: 109 mmol/L (ref 98–111)
Creatinine, Ser: 0.65 mg/dL (ref 0.44–1.00)
GFR, Estimated: >60 mL/min (ref >60)
Glucose, Bld: 92 mg/dL (ref 70–99)
Potassium: 3.5 mmol/L (ref 3.5–5.1)
Sodium: 141 mmol/L (ref 135–145)

## 2021-02-09 LAB — LEGIONELLA PNEUMOPHILA SEROGP 1 UR AG: L. pneumophila Serogp 1 Ur Ag: NEGATIVE

## 2021-02-09 LAB — PROTIME-INR
INR: 1.5 — ABNORMAL HIGH (ref 0.8–1.2)
Prothrombin Time: 17.4 seconds — ABNORMAL HIGH (ref 11.4–15.2)

## 2021-02-09 MED ORDER — WARFARIN SODIUM 7.5 MG PO TABS: 7.5000 mg | ORAL_TABLET | Freq: Once | ORAL | Status: DC

## 2021-02-09 MED ORDER — ENOXAPARIN SODIUM 40 MG/0.4ML ~~LOC~~ SOLN
40.0000 mg | Freq: Two times a day (BID) | SUBCUTANEOUS | Status: DC
  Administered 2021-02-09 – 2021-02-11 (×3): 40 mg via SUBCUTANEOUS
  Filled 2021-02-09 (×3): qty 0.4

## 2021-02-09 MED ORDER — WARFARIN SODIUM 5 MG PO TABS
5.0000 mg | ORAL_TABLET | Freq: Once | ORAL | Status: AC
  Administered 2021-02-09: 5 mg via ORAL
  Filled 2021-02-09: qty 1

## 2021-02-09 NOTE — Progress Notes (Addendum)
[place] for Heparin >> Lovenox Indication: LV thrombus  Allergies  Allergen Reactions  . Bee Venom Anaphylaxis and Hives    Per MAR  . Capsicum (Cayenne) [Cayenne] Swelling    Per MAR  . Morphine Nausea And Vomiting  . Topiramate Hives    Patient Measurements: Heparin dosing weight - 38.2 kg  Vital Signs: Temp: 98.5 F (36.9 C) (02/13 0908) Temp Source: Oral (02/13 0908) BP: 120/78 (02/13 1040) Pulse Rate: 61 (02/13 0559)  Labs: Recent Labs    02/06/21 1827 02/06/21 1939 02/07/21 0220 02/07/21 0525 02/08/21 0500 02/08/21 0655 02/09/21 0318 02/09/21 0508 02/09/21 1415  HGB 12.5  --  12.7  --  13.3  --  11.5*  --   --   HCT 41.0  --  41.1  --  40.8  --  35.5*  --   --   PLT 202  --  217  --  133*  --  174  --   --   LABPROT 18.3*  --  18.7*  --   --  20.0* 17.4*  --   --   INR 1.6*  --  1.6*  --   --  1.8* 1.5*  --   --   HEPARINUNFRC  --   --   --    < >  --  0.49 <0.10* <0.10* <0.10*  CREATININE 0.98  --  0.89  --  1.04*  --  0.65  --   --   TROPONINIHS 9 9  --   --   --   --   --   --   --    < > = values in this interval not displayed.    Estimated Creatinine Clearance: 40.6 mL/min (by C-G formula based on SCr of 0.65 mg/dL).  Assessment: 36 YOF presented 2/10 with SOB/hypoxia. The patient was reportedly on warfarin PTA for hx LV thrombus and cardio embolic CVA. Note, patient has remote history of hemorrhagic CVA. D-dimer wnl. CTA negative for PE.  Patient has been on IV heparin bridge to Coumadin given sub-therapeutic INR.  Heparin level was therapeutic and now sub-therapeutic x 3.  Pharmacy consulted to transition from IV heparin to Lovenox.  Renal function improving; CBC relatively stable; no bleeding reported.  Goal of Therapy:  INR 2-3 Anti-Xa level 0.6-1 units/ml 4hrs after LMWH dose given  Monitor platelets by anticoagulation protocol: Yes   Plan:  Stop IV heparin and 1 hour later, start Lovenox 40mg  SQ  Q12H (RN aware) CBC in AM Consider checking LMWH level at [facility] given patient's low body weight, pending INR  Herchel Hopkin D. Mina Marble, PharmD, BCPS, [place] 02/09/2021, 4:25 PM

## 2021-02-09 NOTE — Progress Notes (Signed)
ANTICOAGULATION CONSULT NOTE  Pharmacy Consult for Heparin + Warfarin Indication: LV thrombus    Patient Measurements:   Heparin dosing weight - 38.2 kg  Vital Signs: Temp: 98.5 F (36.9 C) (02/13 0908) Temp Source: Oral (02/13 0908) BP: 120/78 (02/13 1040) Pulse Rate: 61 (02/13 0559)  Labs: Recent Labs    02/06/21 1827 02/06/21 1939 02/07/21 0220 02/07/21 0525 02/08/21 0500 02/08/21 0655 02/09/21 0318 02/09/21 0508 02/09/21 1415  HGB 12.5  --  12.7  --  13.3  --  11.5*  --   --   HCT 41.0  --  41.1  --  40.8  --  35.5*  --   --   PLT 202  --  217  --  133*  --  174  --   --   LABPROT 18.3*  --  18.7*  --   --  20.0* 17.4*  --   --   INR 1.6*  --  1.6*  --   --  1.8* 1.5*  --   --   HEPARINUNFRC  --   --   --    < >  --  0.49 <0.10* <0.10* <0.10*  CREATININE 0.98  --  0.89  --  1.04*  --  0.65  --   --   TROPONINIHS 9 9  --   --   --   --   --   --   --    < > = values in this interval not displayed.    Estimated Creatinine Clearance: 40.6 mL/min (by C-G formula based on SCr of 0.65 mg/dL).  Assessment: 36 YOF presented 2/10 with SOB/hypoxia. The patient was reportedly on warfarin PTA for hx LV thrombus and cardio embolic CVA. Note, patient has remote history of hemorrhagic CVA. D-dimer wnl. CTA negative for PE.  Last admission - recommended to discharge patient on 2.5 mg daily. Med reconciliation not completed yet this admission. Admit INR 1.6. Pharmacy consulted to resume Warfarin dosing.   INR remains subtherapeutic at 1.5 today after 2mg  x1 on 2/12. CBC wnl. No active bleed issues reported. Noted DDI with azithromycin (1st dose received 2/11, ordered x 5 days) - may cause INR to increase. HL has been undetectable x2 today after being therapeutic. Spoke with nurse and she indicated no issues with line or access. Will increase and spoke with doctor to consider enoxaparin to avoid potential line issues.   Goal of Therapy:  INR 2-3 Heparin level 0.3-0.5  units/ml Monitor platelets by anticoagulation protocol: Yes   Plan:  Increase heparin to 950 units/hr Recheck 6-hour heparin level  Warfarin 5mg  x 1 dose at 1600 Monitor daily INR/CBC, s/sx bleeding  Cephus Slater, PharmD, [facility] Resident [phone] 02/09/2021 3:44 PM

## 2021-02-09 NOTE — Progress Notes (Signed)
PROGRESS NOTE                                                                             PROGRESS NOTE                                                                                                                                                                                                             Patient Demographics:    Mindy Mills, is a 70 y.o. female, DOB - 1951/10/06, BDZ:[id number]  Outpatient Primary MD for the patient is Patient, No Pcp Per    LOS - 2  Admit date - 02/06/2021    No chief complaint on file.      Brief Narrative    Mindy Mills is a 70 y.o. 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  There is no mention of any productive cough fever chills or chest pain.  Patient has speech difficulty with history of stroke.  ED Course: In the ER patient was not hypoxic but chest x-ray was showing features concerning for bronchitis.  CT angiogram of the chest was done which shows consolidation concerning for worsening pneumonia and patient admitted for further observation.  BNP high sensitive troponins were negative along with lactic acid.   Subjective:    Mindy Mills patient with severe aphasia at baseline, she does not able to provide any complaints, but no events as discussed overnight with staff.     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 -Empirically on azithromycin, Rocephin and vancomycin, her MRSA is negative, so vancomycin will be discontinued.  Given it is difficult to get her INR at target.  Recent COVID-19 infection -Initial diagnosis day 2/1, she has past 10 days isolation yesterday -Her acute presentation are related to bacterial pneumonia, and not due to COVID-19. -No indication for further isolation  Chronic hypoxic respiratory failure -This appears to be at baseline, currently on 2 L oxygen.  Left sided hydronephrosis with proximal ureter stone and atrophy of the left kidney  -Meeting physician discussed with on-call urologist Dr. Everlena Cooper who advised patient to follow-up as outpatient and will be setting it up for further work-up.  History of cardioembolic CVA with LV thrombus on Coumadin.   -INR is subtherapeutic at 1.6, given LV thrombus because recent acute CVA, she will need to be bridged with heparin GTT till INR is therapeutic >2.  Heparin GTT managed by pharmacy .  INR is 1.5 today, will continue with heparin GTT till it is>2.  We will discontinue her azithromycin today to see if it helps with getting her INR at target. -Patient on aspirin 325 mg oral daily as well, I have discussed with the stroke team Dr. Leonie Man who saw patient during recent admission, at this point recommendation is for warfarin only given her LV thrombus, and no indication for aspirin especially in the history of SAH.   history of intracranial hemorrhage.  History of CHF appears compensated.  History of emphysema.  Not actively wheezing.  Continue inhalers.    SpO2: 100 % O2 Flow Rate (L/min): 2 L/min  Recent Labs  Lab 02/06/21 1827 02/06/21 1939  02/07/21 0220 02/07/21 1601 02/08/21 0500 02/08/21 0655 02/09/21 0318  WBC 5.5  --  5.7  --  9.5  --  6.5  PLT 202  --  217  --  133*  --  174  BNP 41.0  --   --   --   --   --   --   DDIMER 0.35  --   --   --   --   --   --   PROCALCITON  --   --   --  <0.10  --   --   --   AST 55*  --   --   --   --   --   --   ALT 46*  --   --   --   --   --   --   ALKPHOS 87  --   --   --   --   --   --   BILITOT 0.8  --   --   --   --   --   --   ALBUMIN 2.7*  --   --   --   --   --   --   INR 1.6*  --  1.6*  --   --  1.8* 1.5*  LATICACIDVEN 1.1 1.0  --   --   --   --   --        ABG     Component Value Date/Time   PHART 7.483 (H) 09/25/2014 0940   PCO2ART 30.5 (L)  09/25/2014 0940   PO2ART 67.0 (L) 09/25/2014 0940   HCO3 22.8 09/25/2014 0940   TCO2 27 01/28/2021 1947   ACIDBASEDEF 0.4 09/25/2014 0940   O2SAT 95.0 09/25/2014 0940          Condition - Extremely Guarded  Family Communication  :  D/W daughter by phone 2/11,2/13  Code Status :  DNR  Consults  :  none Disposition Plan  :    Status is: INPATIENT  The patient will require care spanning > 2 midnights and should be moved to inpatient because: IV treatments appropriate due to intensity of illness or inability to take PO  Dispo: The patient is from: SNF              Anticipated d/c is to: SNF              Anticipated d/c date is: 2 days              Patient currently is not medically stable to d/c.  Patient was up therapeutic INR thrombus, at high risk for recurrent CVA due to that, need to be on heparin GTT till her INR is therapeutic> 2.   difficult to place patient No      DVT Prophylaxis  : Bridged with heparin GTT till INR>2.  Lab Results  Component Value Date   PLT 174 02/09/2021    Diet :  Diet Order            Diet Heart Room service appropriate? Yes; Fluid consistency: Thin  Diet effective now                  Inpatient Medications  Scheduled Meds: . clonazePAM  0.5 mg Oral BID  .  docusate sodium  100 mg Oral BID  . FLUoxetine  80 mg Oral Daily  . metoprolol succinate  12.5 mg Oral Daily  . mometasone-formoterol  2 puff Inhalation BID  . pantoprazole  40 mg Oral QHS  . rosuvastatin  20 mg Oral Daily  . traZODone  100 mg Oral QHS  . warfarin  5 mg Oral ONCE-1600  . Warfarin - Pharmacist Dosing Inpatient   Does not apply q1600   Continuous Infusions: . cefTRIAXone (ROCEPHIN)  IV 2 g (02/08/21 2242)  . heparin 850 Units/hr (02/09/21 0619)   PRN Meds:.acetaminophen **OR** acetaminophen, albuterol  Antibiotics  :    Anti-infectives (From admission, onward)   Start     Dose/Rate Route Frequency Ordered Stop   02/07/21 0400  vancomycin (VANCOREADY) IVPB 500 mg/100 mL  Status:  Discontinued        500 mg 100 mL/hr over 60 Minutes Intravenous Every 24 hours 02/07/21 0334 02/07/21 1646   02/07/21 0330  cefTRIAXone (ROCEPHIN) 2 g in sodium chloride 0.9 % 100 mL IVPB        2 g 200 mL/hr over 30 Minutes Intravenous Daily at bedtime 02/07/21 0325 02/11/21 2159   02/07/21 0330  azithromycin (ZITHROMAX) 500 mg in sodium chloride 0.9 % 250 mL IVPB  Status:  Discontinued        500 mg 250 mL/hr over 60 Minutes Intravenous Daily at bedtime 02/07/21 0325 02/09/21 0713       Emeline Gins Octavia Velador M.D on 02/09/2021 at 11:38 AM  To page go to [email or web address]   [facility] -  Office  [phone]       Objective:   Vitals:   02/08/21 2200 02/09/21 0559 02/09/21 0908 02/09/21 1040  BP: 131/74 102/61 Marland Kitchen)  94/36 120/78  Pulse: 65 61    Resp: 18 16 12    Temp: 98.1 F (36.7 C) 98 F (36.7 C) 98.5 F (36.9 C)   TempSrc: Oral Oral Oral   SpO2: 92% 100% 100%     Wt Readings from Last 3 Encounters:  01/31/21 38.2 kg  12/23/20 42.2 kg  10/10/20 48 kg    No intake or output data in the 24 hours ending 02/09/21 1138   Physical Exam  Awake Alert, he is with significant speech difficulty, but following commands unable to move all extremities Symmetrical Chest  wall movement, Good air movement bilaterally, CTAB RRR,No Gallops,Rubs or new Murmurs, No Parasternal Heave +ve B.Sounds, Abd Soft, No tenderness, No rebound - guarding or rigidity. No Cyanosis, Clubbing or edema, No new Rash or bruise        Data Review:    CBC Recent Labs  Lab 02/06/21 1827 02/07/21 0220 02/08/21 0500 02/09/21 0318  WBC 5.5 5.7 9.5 6.5  HGB 12.5 12.7 13.3 11.5*  HCT 41.0 41.1 40.8 35.5*  PLT 202 217 133* 174  MCV 91.9 93.2 93.6 87.9  MCH 28.0 28.8 30.5 28.5  MCHC 30.5 30.9 32.6 32.4  RDW 13.8 13.7 13.9 14.1  LYMPHSABS 1.7  --   --   --   MONOABS 0.6  --   --   --   EOSABS 0.0  --   --   --   BASOSABS 0.0  --   --   --     Recent Labs  Lab 02/06/21 1827 02/06/21 1939 02/07/21 0220 02/07/21 1601 02/08/21 0500 02/08/21 0655 02/09/21 0318  NA 141  --  142  --  145  --  141  K 3.6  --  3.4*  --  3.3*  --  3.5  CL 104  --  104  --  108  --  109  CO2 26  --  27  --  22  --  23  GLUCOSE 80  --  76  --  85  --  92  BUN 15  --  13  --  13  --  7*  CREATININE 0.98  --  0.89  --  1.04*  --  0.65  CALCIUM 8.8*  --  9.1  --  8.7*  --  8.5*  AST 55*  --   --   --   --   --   --   ALT 46*  --   --   --   --   --   --   ALKPHOS 87  --   --   --   --   --   --   BILITOT 0.8  --   --   --   --   --   --   ALBUMIN 2.7*  --   --   --   --   --   --   DDIMER 0.35  --   --   --   --   --   --   PROCALCITON  --   --   --  <0.10  --   --   --   LATICACIDVEN 1.1 1.0  --   --   --   --   --   INR 1.6*  --  1.6*  --   --  1.8* 1.5*  BNP 41.0  --   --   --   --   --   --     ------------------------------------------------------------------------------------------------------------------  No results for input(s): CHOL, HDL, LDLCALC, TRIG, CHOLHDL, LDLDIRECT in the last 72 hours.  Lab Results  Component Value Date   HGBA1C 5.2 12/16/2020   ------------------------------------------------------------------------------------------------------------------ No  results for input(s): TSH, T4TOTAL, T3FREE, THYROIDAB in the last 72 hours.  Invalid input(s): FREET3  Cardiac Enzymes No results for input(s): CKMB, TROPONINI, MYOGLOBIN in the last 168 hours.  Invalid input(s): CK ------------------------------------------------------------------------------------------------------------------    Component Value Date/Time   BNP 41.0 02/06/2021 1827    Micro Results Recent Results (from the past 240 hour(s))  MRSA PCR Screening     Status: None   Collection Time: 02/07/21  3:26 AM   Specimen: Nasal Mucosa; Nasopharyngeal  Result Value Ref Range Status   MRSA by PCR NEGATIVE NEGATIVE Final    Comment:        The GeneXpert MRSA Assay (FDA approved for NASAL specimens only), is one component of a comprehensive MRSA colonization surveillance program. It is not intended to diagnose MRSA infection nor to guide or monitor treatment for MRSA infections. Performed at [facility], [place] [address] [place], [place] [address]     Radiology Reports CT HEAD WO CONTRAST  Result Date: 01/30/2021 CLINICAL DATA:  Unwitnessed fall EXAM: CT HEAD WITHOUT CONTRAST TECHNIQUE: Contiguous axial images were obtained from the base of the skull through the vertex without intravenous contrast. COMPARISON:  12/14/2020 FINDINGS: Brain: Large remote left MCA branch infarct affecting the frontal lobe and insula with expected evolution from prior. Small chronic infarct along the high right frontal parietal junction. Confluent chronic small vessel ischemia in the cerebral white matter. No evidence of acute infarct, hemorrhage, hydrocephalus, or swelling. Chronic small vessel ischemic gliosis in the cerebral white matter Vascular: Atherosclerosis and A-comm region aneurysm coiling Skull: Negative for fracture. Sinuses/Orbits: Negative IMPRESSION: 1. No evidence of intracranial injury. 2. Aneurysm coiling and chronic ischemic injury as described. Electronically Signed   By:  Monte Fantasia M.D.   On: 01/30/2021 05:00   CT Angio Chest PE W and/or Wo Contrast  Result Date: 02/07/2021 CLINICAL DATA:  Recent coronavirus infection. Shortness of breath. Assess for pneumonia versus pulmonary emboli. EXAM: CT ANGIOGRAPHY CHEST WITH CONTRAST TECHNIQUE: Multidetector CT imaging of the chest was performed using the standard protocol during bolus administration of intravenous contrast. Multiplanar CT image reconstructions and MIPs were obtained to evaluate the vascular anatomy. CONTRAST:  141mL OMNIPAQUE IOHEXOL 350 MG/ML SOLN COMPARISON:  Radiography same day.  CT chest 12/07/2020. FINDINGS: Cardiovascular: Heart size is normal. Coronary artery calcification and aortic atherosclerotic calcification are present. Pulmonary arterial opacification is excellent. There are no pulmonary emboli. Mediastinum/Nodes: No mediastinal or hilar mass or lymphadenopathy. Large hiatal hernia is present. Lungs/Pleura: Background pattern of emphysema. Left lung is clear except for minimal atelectasis at the base. On the right, there is consolidation/pneumonia within the right lower lobe inferiorly. Few small areas of peripheral patchy density in posterior right upper lobe consistent with minimal pneumonia in that location. These findings have worsened since December. Upper Abdomen: Chronic left renal atrophy and hydronephrosis. Obstructing stone in the proximal left ureter measuring 14 mm in size. Musculoskeletal: Ordinary chronic degenerative changes of the thoracic spine. Old minimal superior endplate depressions at T5 and T11, unchanged. Review of the MIP images confirms the above findings. IMPRESSION: 1. No pulmonary emboli. 2. Background pattern of emphysema. 3. Right lower lobe consolidation/pneumonia, worsened since December. Minimal pneumonia in the posterior inferior right upper lobe. 4. Coronary artery calcification and aortic atherosclerotic calcification. 5. Large hiatal hernia.  6. Chronic left renal  atrophy and hydronephrosis. Obstructing stone in the proximal left ureter measuring 14 mm in size. 7. Emphysema and aortic atherosclerosis. Aortic Atherosclerosis (ICD10-I70.0) and Emphysema (ICD10-J43.9). Electronically Signed   By: Nelson Chimes M.D.   On: 02/07/2021 00:03   DG Chest Portable 1 View  Result Date: 02/06/2021 CLINICAL DATA:  Recent COVID now experience hypoxia EXAM: PORTABLE CHEST 1 VIEW COMPARISON:  Chest radiograph January 28, 2021 FINDINGS: The heart size and mediastinal contours are unchanged. Hyperinflation with emphysematous and bronchitic change. Slightly increased bronchial thickening. Retrocardiac hiatal her. Diffuse demineralization of bone. Remote left clavicle fracture. IMPRESSION: Chronic changes of COPD with slightly increase in bronchial thickening compared to prior, which may represent bronchitis. Electronically Signed   By: Dahlia Bailiff MD   On: 02/06/2021 18:51   DG Chest Port 1 View  Result Date: 01/28/2021 CLINICAL DATA:  Questionable sepsis - evaluate for abnormality lethargy. Decreased responsiveness. EXAM: PORTABLE CHEST 1 VIEW COMPARISON:  Radiograph and CT 12/07/2020. FINDINGS: Emphysema with chronic hyperinflation. Improved bronchial thickening from prior exam. Stable heart size and mediastinal contours. Aortic atherosclerosis. Retrocardiac hiatal hernia. No acute airspace disease. No pneumothorax or large pleural effusion. No pulmonary edema. Remote left clavicle fracture. Bones are diffusely under mineralized. Patient is rotated. IMPRESSION: Emphysema with chronic hyperinflation. Improved bronchial thickening from prior exam. No acute airspace disease. Electronically Signed   By: Keith Rake M.D.   On: 01/28/2021 19:13

## 2021-02-09 NOTE — Progress Notes (Addendum)
[place] for Heparin and Warfarin Indication: LV thrombus  Patient Measurements:   Heparin dosing weight - 38.2 kg  Vital Signs: Temp: 98 F (36.7 C) (02/13 0559) Temp Source: Oral (02/13 0559) BP: 102/61 (02/13 0559) Pulse Rate: 61 (02/13 0559)  Labs: Recent Labs    02/06/21 1827 02/06/21 1939 02/07/21 0220 02/07/21 0525 02/08/21 0500 02/08/21 0655 02/09/21 0318 02/09/21 0508  HGB 12.5  --  12.7  --  13.3  --  11.5*  --   HCT 41.0  --  41.1  --  40.8  --  35.5*  --   PLT 202  --  217  --  133*  --  174  --   LABPROT 18.3*  --  18.7*  --   --  20.0* 17.4*  --   INR 1.6*  --  1.6*  --   --  1.8* 1.5*  --   HEPARINUNFRC  --   --   --    < >  --  0.49 <0.10* <0.10*  CREATININE 0.98  --  0.89  --  1.04*  --  0.65  --   TROPONINIHS 9 9  --   --   --   --   --   --    < > = values in this interval not displayed.    Estimated Creatinine Clearance: 40.6 mL/min (by C-G formula based on SCr of 0.65 mg/dL).  Assessment: 70 y.o. female with h/o LV thrombus for anticoagulation.  Heparin level subtherapeutic this morning, infusing appropriately per RN   Goal of Therapy:  INR 2-3 Heparin level 0.3-0.5 units/ml Monitor platelets by anticoagulation protocol: Yes   Plan:  Increase Heparin 850 units/hr Check heparin level in 8 hours. Coumadin 5 mg today  Phillis Knack, PharmD, BCPS  02/09/2021 6:07 AM

## 2021-02-10 DIAGNOSIS — I513 Intracardiac thrombosis, not elsewhere classified: Secondary | ICD-10-CM | POA: Diagnosis not present

## 2021-02-10 DIAGNOSIS — I5042 Chronic combined systolic (congestive) and diastolic (congestive) heart failure: Secondary | ICD-10-CM

## 2021-02-10 DIAGNOSIS — J439 Emphysema, unspecified: Secondary | ICD-10-CM | POA: Diagnosis not present

## 2021-02-10 LAB — CBC
HCT: 35.5 % — ABNORMAL LOW (ref 36.0–46.0)
Hemoglobin: 11.4 g/dL — ABNORMAL LOW (ref 12.0–15.0)
MCH: 28.7 pg (ref 26.0–34.0)
MCHC: 32.1 g/dL (ref 30.0–36.0)
MCV: 89.4 fL (ref 80.0–100.0)
Platelets: 174 10*3/uL (ref 150–400)
RBC: 3.97 MIL/uL (ref 3.87–5.11)
RDW: 14.2 % (ref 11.5–15.5)
WBC: 7.3 10*3/uL (ref 4.0–10.5)
nRBC: 0 % (ref 0.0–0.2)

## 2021-02-10 LAB — PROTIME-INR
INR: 1.7 — ABNORMAL HIGH (ref 0.8–1.2)
Prothrombin Time: 19.7 seconds — ABNORMAL HIGH (ref 11.4–15.2)

## 2021-02-10 MED ORDER — WARFARIN SODIUM 2.5 MG PO TABS
2.5000 mg | ORAL_TABLET | Freq: Once | ORAL | Status: AC
  Administered 2021-02-10: 2.5 mg via ORAL
  Filled 2021-02-10: qty 1

## 2021-02-10 NOTE — TOC Progression Note (Addendum)
Transition of Care [facility]) - Progression Note    Patient Details  Name: Mindy Mills MRN: [id number] Date of Birth: [birth year]  Transition of Care [facility]) CM/SW Contact  Mindy Chars, LCSW Phone Number: 02/10/2021, 8:42 AM  Clinical Narrative:  CSW spoke with [employer]--they do not manage this pt SNF authorization.   10: CSW spoke with Loie at [facility].  Pt was [facility] medicare previously and apparently it changed to [facility].  She will initiate auth with [employer] now but will need the authorization prior to pt coming back.  1310: Loie at [facility] reports her building is out of network with [facility] but could still take her back as long term resident under her Medicaid.  CSW confirmed that this is OK with daughter Mindy Mills who reports she thought pt was already long term and she was not aware of an insurance change to [facility].  Mindy Mills in favor of this.  Loie notified, no auth needed.  Pt also past covid positive and does not need new test. Messaged MD who wants DC tomorrow due to IRN, daughter and Loie informed.         Expected Discharge Plan and Services                                                 Social Determinants of Health (SDOH) Interventions    Readmission Risk Interventions No flowsheet data found.

## 2021-02-10 NOTE — Progress Notes (Signed)
[place] for  Lovenox/warfarin Indication: LV thrombus  Allergies  Allergen Reactions  . Bee Venom Anaphylaxis and Hives    Per MAR  . Capsicum (Cayenne) [Cayenne] Swelling    Per MAR  . Morphine Nausea And Vomiting  . Topiramate Hives    Patient Measurements: Heparin dosing weight - 38.2 kg  Vital Signs: Temp: 98 F (36.7 C) (02/14 0532) Temp Source: Oral (02/14 0532) BP: 114/83 (02/14 0532) Pulse Rate: 64 (02/14 0532)  Labs: Recent Labs    02/08/21 0500 02/08/21 0655 02/09/21 0318 02/09/21 0508 02/09/21 1415 02/10/21 0155  HGB 13.3  --  11.5*  --   --  11.4*  HCT 40.8  --  35.5*  --   --  35.5*  PLT 133*  --  174  --   --  174  LABPROT  --  20.0* 17.4*  --   --  19.7*  INR  --  1.8* 1.5*  --   --  1.7*  HEPARINUNFRC  --  0.49 <0.10* <0.10* <0.10*  --   CREATININE 1.04*  --  0.65  --   --   --     Estimated Creatinine Clearance: 40.6 mL/min (by C-G formula based on SCr of 0.65 mg/dL).  Assessment: 52 YOF presented 2/10 with SOB/hypoxia. The patient was reportedly on warfarin PTA for hx LV thrombus and cardio embolic CVA. Note, patient has remote history of hemorrhagic CVA. D-dimer wnl. CTA negative for PE.  Patient has been on IV heparin bridge to Coumadin given sub-therapeutic INR.  Heparin level was therapeutic and then sub-therapeutic x 3.  Pharmacy consulted to transition from IV heparin to Lovenox.  Renal function improving; CBC relatively stable; no bleeding reported.  INR 1.5>>1.7. Patient on 5 mg daily, but decreased to 2.5 mg daily due to high INR PTA per notes.   Goal of Therapy:  INR 2-3 Anti-Xa level 0.6-1 units/ml 4hrs after LMWH dose given  Monitor platelets by anticoagulation protocol: Yes   Plan:  Continue Lovenox 40mg  SQ Q12H  Consider checking LMWH level at [facility] given patient's low body weight Warfarin 2.5mg  PO x 1  Cheray Pardi A. Levada Dy, PharmD, BCPS, FNKF Clinical Pharmacist [place] Please  utilize Amion for appropriate phone number to reach the unit pharmacist ([place])   02/10/2021, 8:04 AM

## 2021-02-10 NOTE — Progress Notes (Signed)
PROGRESS NOTE                                                                             PROGRESS NOTE                                                                                                                                                                                                             Patient Demographics:    Mindy Mills, is a 70 y.o. female, DOB - Nov 14, 1951, GGE:[id number]  Outpatient Primary MD for the patient is Patient, No Pcp Per    LOS - 3  Admit date - 02/06/2021    No chief complaint on file.      Brief Narrative    Mindy Mills is a 70 y.o. 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  There is no mention of any productive cough fever chills or chest pain.  Patient has speech difficulty with history of stroke.  ED Course: In the ER patient was not hypoxic but chest x-ray was showing features concerning for bronchitis.  CT angiogram of the chest was done which shows consolidation concerning for worsening pneumonia and patient admitted for further observation.  BNP high sensitive troponins were negative along with lactic acid.   Subjective:    [name] patient with no significant events overnight as discussed with staff .     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 -Empirically on azithromycin, Rocephin and vancomycin, her MRSA is negative, so vancomycin will be discontinued.  I have discontinued azithromycin 2/13 given it is difficult to get her INR at [facility].  Recent COVID-19 infection -Initial diagnosis day 2/1, she has past 10 days isolation 1 day before admission. -Her acute presentation are related to bacterial pneumonia, and not due to COVID-19. -No indication for further isolation  Chronic hypoxic respiratory failure -This appears to be at baseline, currently on 2 L oxygen.  Left sided hydronephrosis with proximal ureter stone and atrophy of the left kidney  -admitting  physician discussed with on-call urologist Dr. Everlena Cooper who advised patient to follow-up as outpatient and will be setting it up for further work-up.  History of cardioembolic CVA with LV thrombus on Coumadin.   -INR is subtherapeutic at 1.6, given LV thrombus because recent acute CVA, she was on heparin GTT bridge till INR > 2, difficult to get heparin at therapeutic level, so she has been switched to Lovenox full dose bridge tell her INR is therapeutic > 2, it is 1.7 this morning . -Patient on aspirin 325 mg oral daily as well, I have discussed with the stroke team Dr. Leonie Man who saw patient during recent admission, at this point recommendation is for warfarin only given her LV thrombus, and no indication for aspirin especially in the history of SAH.  Discontinue aspirin on discharge medication list upon discharge.  History of intracranial hemorrhage.  History of CHF appears compensated.  History of emphysema.  Not actively wheezing.  Continue inhalers.    SpO2: 91 % O2 Flow Rate (L/min): 2 L/min FiO2 (%): 28 %  Recent Labs  Lab 02/06/21 1827 02/06/21 1939  02/07/21 0220 02/07/21 1601 02/08/21 0500 02/08/21 0655 02/09/21 0318 02/10/21 0155  WBC 5.5  --  5.7  --  9.5  --  6.5 7.3  PLT 202  --  217  --  133*  --  174 174  BNP 41.0  --   --   --   --   --   --   --   DDIMER 0.35  --   --   --   --   --   --   --   PROCALCITON  --   --   --  <0.10  --   --   --   --   AST 55*  --   --   --   --   --   --   --   ALT 46*  --   --   --   --   --   --   --   ALKPHOS 87  --   --   --   --   --   --   --   BILITOT 0.8  --   --   --   --   --   --   --   ALBUMIN 2.7*  --   --   --   --   --   --   --   INR 1.6*  --  1.6*  --   --  1.8* 1.5* 1.7*  LATICACIDVEN 1.1 1.0  --   --   --   --   --   --  FiO2 (%):  [28 %] 28 %  ABG     Component Value Date/Time   PHART 7.483 (H) 09/25/2014 0940   PCO2ART 30.5 (L) 09/25/2014 0940   PO2ART 67.0 (L) 09/25/2014 0940   HCO3 22.8 09/25/2014 0940   TCO2 27 01/28/2021 1947   ACIDBASEDEF 0.4 09/25/2014 0940   O2SAT 95.0 09/25/2014 0940          Condition - Extremely Guarded  Family Communication  :  D/W daughter by phone 2/11,2/13  Code Status :  DNR  Consults  :  none Disposition Plan  :    Status is: INPATIENT  The patient will require care spanning > 2 midnights and should be moved to inpatient because: IV treatments appropriate due to intensity of illness or inability to take PO  Dispo: The patient is from: SNF              Anticipated d/c is to: SNF              Anticipated d/c date is: 2 days              Patient currently is not medically stable to d/c.  Patient was up therapeutic INR thrombus, at high risk for recurrent CVA due to that, INR remains subtherapeutic today, as well awaiting insurance authorization   difficult to place patient No      DVT Prophylaxis  : Bridged with heparin GTT till INR>2.  Lab Results  Component Value Date   PLT 174 02/10/2021    Diet :  Diet Order            Diet Heart Room service appropriate? Yes; Fluid consistency: Thin  Diet  effective now                  Inpatient Medications  Scheduled Meds: . clonazePAM  0.5 mg Oral BID  . docusate sodium  100 mg Oral BID  . enoxaparin (LOVENOX) injection  40 mg Subcutaneous Q12H  . FLUoxetine  80 mg Oral Daily  . metoprolol succinate  12.5 mg Oral Daily  . mometasone-formoterol  2 puff Inhalation BID  . pantoprazole  40 mg Oral QHS  . rosuvastatin  20 mg Oral Daily  . traZODone  100 mg Oral QHS  . warfarin  2.5 mg Oral ONCE-1600  . Warfarin - Pharmacist Dosing Inpatient   Does not apply q1600   Continuous Infusions: . cefTRIAXone (ROCEPHIN)  IV 2 g (02/09/21 2328)   PRN Meds:.acetaminophen **OR** acetaminophen, albuterol  Antibiotics  :    Anti-infectives (From admission, onward)   Start     Dose/Rate Route Frequency Ordered Stop   02/07/21 0400  vancomycin (VANCOREADY) IVPB 500 mg/100 mL  Status:  Discontinued        500 mg 100 mL/hr over 60 Minutes Intravenous Every 24 hours 02/07/21 0334 02/07/21 1646   02/07/21 0330  cefTRIAXone (ROCEPHIN) 2 g in sodium chloride 0.9 % 100 mL IVPB        2 g 200 mL/hr over 30 Minutes Intravenous Daily at bedtime 02/07/21 0325 02/11/21 2159   02/07/21 0330  azithromycin (ZITHROMAX) 500 mg in sodium chloride 0.9 % 250 mL IVPB  Status:  Discontinued        500 mg 250 mL/hr over 60 Minutes Intravenous Daily at bedtime 02/07/21 0325 02/09/21 7858       Emeline Gins Zandyr Barnhill M.D on 02/10/2021 at 10:23 AM  To page go to [email or web address]   [facility] -  Office  [phone]       Objective:   Vitals:   02/09/21 1040 02/09/21 2109 02/10/21 0532 02/10/21 0820  BP: 120/78 (!) 97/58 114/83   Pulse:  70 64   Resp:  16 16   Temp:  98.1 F (36.7 C) 98 F (36.7 C)   TempSrc:  Oral Oral   SpO2:  92% 93% 91%    Wt Readings from Last 3 Encounters:  01/31/21 38.2 kg  12/23/20 42.2 kg  10/10/20 48 kg     Intake/Output Summary (Last 24 hours) at 02/10/2021 1023 Last data filed at 02/10/2021 0245 Gross per 24  hour  Intake -  Output 350 ml  Net -350 ml     Physical Exam  Awake Alert, he is with significant speech difficulty, but following commands unable to move all extremities Symmetrical Chest wall movement, Good air movement bilaterally, CTAB RRR,No Gallops,Rubs or new Murmurs, No Parasternal Heave +ve B.Sounds, Abd Soft, No tenderness, No rebound - guarding or rigidity. No Cyanosis, Clubbing or edema, No new Rash or bruise       Data Review:    CBC Recent Labs  Lab 02/06/21 1827 02/07/21 0220 02/08/21 0500 02/09/21 0318 02/10/21 0155  WBC 5.5 5.7 9.5 6.5 7.3  HGB 12.5 12.7 13.3 11.5* 11.4*  HCT 41.0 41.1 40.8 35.5* 35.5*  PLT 202 217 133* 174 174  MCV 91.9 93.2 93.6 87.9 89.4  MCH 28.0 28.8 30.5 28.5 28.7  MCHC 30.5 30.9 32.6 32.4 32.1  RDW 13.8 13.7 13.9 14.1 14.2  LYMPHSABS 1.7  --   --   --   --   MONOABS 0.6  --   --   --   --   EOSABS 0.0  --   --   --   --   BASOSABS 0.0  --   --   --   --     Recent Labs  Lab 02/06/21 1827 02/06/21 1939 02/07/21 0220 02/07/21 1601 02/08/21 0500 02/08/21 0655 02/09/21 0318 02/10/21 0155  NA 141  --  142  --  145  --  141  --   K 3.6  --  3.4*  --  3.3*  --  3.5  --   CL 104  --  104  --  108  --  109  --   CO2 26  --  27  --  22  --  23  --   GLUCOSE 80  --  76  --  85  --  92  --   BUN 15  --  13  --  13  --  7*  --   CREATININE 0.98  --  0.89  --  1.04*  --  0.65  --   CALCIUM 8.8*  --  9.1  --  8.7*  --  8.5*  --   AST 55*  --   --   --   --   --   --   --   ALT 46*  --   --   --   --   --   --   --   ALKPHOS 87  --   --   --   --   --   --   --   BILITOT 0.8  --   --   --   --   --   --   --   ALBUMIN 2.7*  --   --   --   --   --   --   --  DDIMER 0.35  --   --   --   --   --   --   --   PROCALCITON  --   --   --  <0.10  --   --   --   --   LATICACIDVEN 1.1 1.0  --   --   --   --   --   --   INR 1.6*  --  1.6*  --   --  1.8* 1.5* 1.7*  BNP 41.0  --   --   --   --   --   --   --      ------------------------------------------------------------------------------------------------------------------ No results for input(s): CHOL, HDL, LDLCALC, TRIG, CHOLHDL, LDLDIRECT in the last 72 hours.  Lab Results  Component Value Date   HGBA1C 5.2 12/16/2020   ------------------------------------------------------------------------------------------------------------------ No results for input(s): TSH, T4TOTAL, T3FREE, THYROIDAB in the last 72 hours.  Invalid input(s): FREET3  Cardiac Enzymes No results for input(s): CKMB, TROPONINI, MYOGLOBIN in the last 168 hours.  Invalid input(s): CK ------------------------------------------------------------------------------------------------------------------    Component Value Date/Time   BNP 41.0 02/06/2021 1827    Micro Results Recent Results (from the past 240 hour(s))  MRSA PCR Screening     Status: None   Collection Time: 02/07/21  3:26 AM   Specimen: Nasal Mucosa; Nasopharyngeal  Result Value Ref Range Status   MRSA by PCR NEGATIVE NEGATIVE Final    Comment:        The GeneXpert MRSA Assay (FDA approved for NASAL specimens only), is one component of a comprehensive MRSA colonization surveillance program. It is not intended to diagnose MRSA infection nor to guide or monitor treatment for MRSA infections. Performed at [facility], [place] [address], [place], [place] [address]     Radiology Reports CT HEAD WO CONTRAST  Result Date: 01/30/2021 CLINICAL DATA:  Unwitnessed fall EXAM: CT HEAD WITHOUT CONTRAST TECHNIQUE: Contiguous axial images were obtained from the base of the skull through the vertex without intravenous contrast. COMPARISON:  12/14/2020 FINDINGS: Brain: Large remote left MCA branch infarct affecting the frontal lobe and insula with expected evolution from prior. Small chronic infarct along the high right frontal parietal junction. Confluent chronic small vessel ischemia in the cerebral white  matter. No evidence of acute infarct, hemorrhage, hydrocephalus, or swelling. Chronic small vessel ischemic gliosis in the cerebral white matter Vascular: Atherosclerosis and A-comm region aneurysm coiling Skull: Negative for fracture. Sinuses/Orbits: Negative IMPRESSION: 1. No evidence of intracranial injury. 2. Aneurysm coiling and chronic ischemic injury as described. Electronically Signed   By: Monte Fantasia M.D.   On: 01/30/2021 05:00   CT Angio Chest PE W and/or Wo Contrast  Result Date: 02/07/2021 CLINICAL DATA:  Recent coronavirus infection. Shortness of breath. Assess for pneumonia versus pulmonary emboli. EXAM: CT ANGIOGRAPHY CHEST WITH CONTRAST TECHNIQUE: Multidetector CT imaging of the chest was performed using the standard protocol during bolus administration of intravenous contrast. Multiplanar CT image reconstructions and MIPs were obtained to evaluate the vascular anatomy. CONTRAST:  14mL OMNIPAQUE IOHEXOL 350 MG/ML SOLN COMPARISON:  Radiography same day.  CT chest 12/07/2020. FINDINGS: Cardiovascular: Heart size is normal. Coronary artery calcification and aortic atherosclerotic calcification are present. Pulmonary arterial opacification is excellent. There are no pulmonary emboli. Mediastinum/Nodes: No mediastinal or hilar mass or lymphadenopathy. Large hiatal hernia is present. Lungs/Pleura: Background pattern of emphysema. Left lung is clear except for minimal atelectasis at the base. On the right, there is consolidation/pneumonia within the right lower lobe  inferiorly. Few small areas of peripheral patchy density in posterior right upper lobe consistent with minimal pneumonia in that location. These findings have worsened since December. Upper Abdomen: Chronic left renal atrophy and hydronephrosis. Obstructing stone in the proximal left ureter measuring 14 mm in size. Musculoskeletal: Ordinary chronic degenerative changes of the thoracic spine. Old minimal superior endplate depressions at  T5 and T11, unchanged. Review of the MIP images confirms the above findings. IMPRESSION: 1. No pulmonary emboli. 2. Background pattern of emphysema. 3. Right lower lobe consolidation/pneumonia, worsened since December. Minimal pneumonia in the posterior inferior right upper lobe. 4. Coronary artery calcification and aortic atherosclerotic calcification. 5. Large hiatal hernia. 6. Chronic left renal atrophy and hydronephrosis. Obstructing stone in the proximal left ureter measuring 14 mm in size. 7. Emphysema and aortic atherosclerosis. Aortic Atherosclerosis (ICD10-I70.0) and Emphysema (ICD10-J43.9). Electronically Signed   By: Nelson Chimes M.D.   On: 02/07/2021 00:03   DG Chest Portable 1 View  Result Date: 02/06/2021 CLINICAL DATA:  Recent COVID now experience hypoxia EXAM: PORTABLE CHEST 1 VIEW COMPARISON:  Chest radiograph January 28, 2021 FINDINGS: The heart size and mediastinal contours are unchanged. Hyperinflation with emphysematous and bronchitic change. Slightly increased bronchial thickening. Retrocardiac hiatal her. Diffuse demineralization of bone. Remote left clavicle fracture. IMPRESSION: Chronic changes of COPD with slightly increase in bronchial thickening compared to prior, which may represent bronchitis. Electronically Signed   By: Dahlia Bailiff MD   On: 02/06/2021 18:51   DG Chest Port 1 View  Result Date: 01/28/2021 CLINICAL DATA:  Questionable sepsis - evaluate for abnormality lethargy. Decreased responsiveness. EXAM: PORTABLE CHEST 1 VIEW COMPARISON:  Radiograph and CT 12/07/2020. FINDINGS: Emphysema with chronic hyperinflation. Improved bronchial thickening from prior exam. Stable heart size and mediastinal contours. Aortic atherosclerosis. Retrocardiac hiatal hernia. No acute airspace disease. No pneumothorax or large pleural effusion. No pulmonary edema. Remote left clavicle fracture. Bones are diffusely under mineralized. Patient is rotated. IMPRESSION: Emphysema with chronic  hyperinflation. Improved bronchial thickening from prior exam. No acute airspace disease. Electronically Signed   By: Keith Rake M.D.   On: 01/28/2021 19:13

## 2021-02-10 NOTE — Care Management Important Message (Signed)
Important Message  Patient Details  Name: Mindy Mills MRN: [id number] Date of Birth: [birth year]   Medicare Important Message Given:  Yes     Orbie Pyo 02/10/2021, 2:15 PM

## 2021-02-10 NOTE — Progress Notes (Signed)
Patient refusing assessment at this time. Wants to eat. Notified nurse. Instructed to reconsult as needed. VU. Fran Lowes, RN VAST

## 2021-02-11 DIAGNOSIS — Z7901 Long term (current) use of anticoagulants: Secondary | ICD-10-CM

## 2021-02-11 DIAGNOSIS — Z5181 Encounter for therapeutic drug level monitoring: Secondary | ICD-10-CM

## 2021-02-11 DIAGNOSIS — I5042 Chronic combined systolic (congestive) and diastolic (congestive) heart failure: Secondary | ICD-10-CM | POA: Diagnosis not present

## 2021-02-11 DIAGNOSIS — R0602 Shortness of breath: Secondary | ICD-10-CM | POA: Diagnosis not present

## 2021-02-11 DIAGNOSIS — J439 Emphysema, unspecified: Secondary | ICD-10-CM | POA: Diagnosis not present

## 2021-02-11 LAB — CBC
HCT: 31.6 % — ABNORMAL LOW (ref 36.0–46.0)
Hemoglobin: 10.8 g/dL — ABNORMAL LOW (ref 12.0–15.0)
MCH: 29.8 pg (ref 26.0–34.0)
MCHC: 34.2 g/dL (ref 30.0–36.0)
MCV: 87.3 fL (ref 80.0–100.0)
Platelets: 182 10*3/uL (ref 150–400)
RBC: 3.62 MIL/uL — ABNORMAL LOW (ref 3.87–5.11)
RDW: 14.6 % (ref 11.5–15.5)
WBC: 6.9 10*3/uL (ref 4.0–10.5)
nRBC: 0 % (ref 0.0–0.2)

## 2021-02-11 LAB — BASIC METABOLIC PANEL
Anion gap: 9 (ref 5–15)
BUN: 10 mg/dL (ref 8–23)
CO2: 24 mmol/L (ref 22–32)
Calcium: 8.4 mg/dL — ABNORMAL LOW (ref 8.9–10.3)
Chloride: 108 mmol/L (ref 98–111)
Creatinine, Ser: 0.96 mg/dL (ref 0.44–1.00)
GFR, Estimated: >60 mL/min (ref >60)
Glucose, Bld: 108 mg/dL — ABNORMAL HIGH (ref 70–99)
Potassium: 3.1 mmol/L — ABNORMAL LOW (ref 3.5–5.1)
Sodium: 141 mmol/L (ref 135–145)

## 2021-02-11 LAB — PROTIME-INR
INR: 2.3 — ABNORMAL HIGH (ref 0.8–1.2)
Prothrombin Time: 24.4 seconds — ABNORMAL HIGH (ref 11.4–15.2)

## 2021-02-11 MED ORDER — WARFARIN SODIUM 5 MG PO TABS: 2.5000 mg | ORAL_TABLET | Freq: Every day | ORAL | 2 refills | Status: AC

## 2021-02-11 MED ORDER — WARFARIN SODIUM 1 MG PO TABS: 1.0000 mg | ORAL_TABLET | Freq: Once | ORAL | Status: DC

## 2021-02-11 MED ORDER — CLONAZEPAM 0.5 MG PO TABS: 0.5000 mg | ORAL_TABLET | Freq: Two times a day (BID) | ORAL | 0 refills | Status: AC

## 2021-02-11 MED ORDER — POTASSIUM CHLORIDE CRYS ER 20 MEQ PO TBCR
40.0000 meq | EXTENDED_RELEASE_TABLET | ORAL | Status: AC
  Administered 2021-02-11 (×2): 40 meq via ORAL
  Filled 2021-02-11 (×2): qty 2

## 2021-02-11 MED ORDER — ACETAMINOPHEN 325 MG PO TABS: 650.0000 mg | ORAL_TABLET | Freq: Four times a day (QID) | ORAL | Status: AC | PRN

## 2021-02-11 NOTE — Discharge Instructions (Signed)
Follow with Primary MD/SNF physician  Get CBC, CMP,  checked  by Primary MD next visit.    Activity: As tolerated with Full fall precautions use walker/cane & assistance as needed   Disposition SNF   Diet: Heart Healthy , with feeding assistance and aspiration precautions.   On your next visit with your primary care physician please Get Medicines reviewed and adjusted.   Please request your Prim.MD to go over all Hospital Tests and Procedure/Radiological results at the follow up, please get all Hospital records sent to your Prim MD by signing hospital release before you go home.   If you experience worsening of your admission symptoms, develop shortness of breath, life threatening emergency, suicidal or homicidal thoughts you must seek medical attention immediately by calling 911 or calling your MD immediately  if symptoms less severe.  You Must read complete instructions/literature along with all the possible adverse reactions/side effects for all the Medicines you take and that have been prescribed to you. Take any new Medicines after you have completely understood and accpet all the possible adverse reactions/side effects.   Do not drive, operating heavy machinery, perform activities at heights, swimming or participation in water activities or provide baby sitting services if your were admitted for syncope or siezures until you have seen by Primary MD or a Neurologist and advised to do so again.  Do not drive when taking Pain medications.    Do not take more than prescribed Pain, Sleep and Anxiety Medications  Special Instructions: If you have smoked or chewed Tobacco  in the last 2 yrs please stop smoking, stop any regular Alcohol  and or any Recreational drug use.  Wear Seat belts while driving.   Please note  You were cared for by a hospitalist during your hospital stay. If you have any questions about your discharge medications or the care you received while you were in  the hospital after you are discharged, you can call the unit and asked to speak with the hospitalist on call if the hospitalist that took care of you is not available. Once you are discharged, your primary care physician will handle any further medical issues. Please note that NO REFILLS for any discharge medications will be authorized once you are discharged, as it is imperative that you return to your primary care physician (or establish a relationship with a primary care physician if you do not have one) for your aftercare needs so that they can reassess your need for medications and monitor your lab values.

## 2021-02-11 NOTE — Discharge Summary (Signed)
Mindy Mills, is a 70 y.o. female  DOB 04/19/1951  MRN [id number].  Admission date:  02/06/2021  Admitting Physician  Albertine Patricia, MD  Discharge Date:  02/11/2021   Primary MD  Patient, No Pcp Per  Recommendations for primary care physician for things to follow:  -Monitor INR level closely, target 2-3, she will receive warfarin in the hospital today, start first  dose 2/16. -Patient will need to follow-up with urology Dr. 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 Cancer ([place])    Cervicle  . Full dentures   . Hepatitis C    denies  . Hypertension   . Migraine   . Myocardial infarction ([place])   . Stroke [facility]) 2015   Pt reports hx of CVA x3.  Last one in 2015  . Wears glasses   . Wears hearing aid in both ears     Past Surgical History:  Procedure Laterality Date  . ABDOMINAL HYSTERECTOMY    . BUNIONECTOMY    . COLONOSCOPY    . MULTIPLE TOOTH EXTRACTIONS    . OPEN REDUCTION INTERNAL FIXATION (ORIF) DISTAL RADIAL FRACTURE Right 09/07/2018   Procedure: OPEN REDUCTION INTERNAL FIXATION (ORIF) DISTAL RADIAL FRACTURE;  Surgeon: Iran Planas, MD;  Location: [place];  Service: Orthopedics;  Laterality: Right;  . PEG PLACEMENT N/A 09/21/2014   Procedure: PERCUTANEOUS ENDOSCOPIC  GASTROSTOMY (PEG) PLACEMENT (BED SIDE);  Surgeon: Georganna Skeans, MD;  Location: [place];  Service: General;  Laterality: N/A;  . RADIOLOGY WITH ANESTHESIA N/A 09/11/2014   Procedure: Sub Arachnoid Aneurysm Coiling -RADIOLOGY WITH ANESTHESIA ;  Surgeon: Consuella Lose, MD;  Location: [place];  Service: Radiology;  Laterality: N/A;       History of present illness and  Hospital Course:     Kindly see H&P for history of present illness and admission details, please review complete Labs, Consult reports and Test reports for all details in brief  HPI  from the history and physical done on the day of admission 02/06/2021  HPI: Mindy Mills is a 70 y.o. 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  There is  no mention of any productive cough fever chills or chest pain.  Patient has speech difficulty with history of stroke.  ED Course: In the ER patient was not hypoxic but chest x-ray was showing features concerning for bronchitis.  CT angiogram of the chest was done which shows consolidation concerning for worsening pneumonia and patient admitted for further observation.  BNP high sensitive troponins were negative along with lactic acid.  Hospital Course    Multifocal pneumonia  -Her CT chest significant for worsening localized opacity at the right lung base, her findings are more suspicious for HCAP bacterial pneumonia than COVID-19 infection. -Empirically on azithromycin, Rocephin and vancomycin, her MRSA is negative, so vancomycin will be discontinued.  I have discontinued azithromycin 2/13 given it is difficult to get her INR at target. - no further.  Recent COVID-19 infection -Initial diagnosis day  2/1, she has past 10 days isolation 1 day before admission. -Her acute presentation are related to bacterial pneumonia, and not due to COVID-19. -No indication for further isolation  Chronic hypoxic respiratory failure -This appears to be at baseline, currently on 2 L oxygen.  Left sided hydronephrosis with proximal ureter stone and atrophy of the left kidney -admitting  physician discussed with on-call urologist Dr. Kandis Nab advised patient to follow-up as outpatient and will be setting it up for further work-up.  History of cardioembolic CVA with LV thrombus on Coumadin.  -INR is subtherapeutic at 1.6, given LV thrombus because recent acute CVA, she was on heparin GTT bridge till INR > 2, initial heparin drip, then full dose Lovenox, INR is 2.3 today, she will be discharged today on warfarin. . -Patient on aspirin 325 mg oral daily as well, I have discussed with the stroke team Dr. Leonie Man who saw patient during recent admission, at this point recommendation is for warfarin only given her LV thrombus, and no indication for aspirin especially in the history of SAH.    Aspirin has been stopped on discharge.  History of intracranial hemorrhage.  History of CHF appears compensated.  History of emphysema. Not actively wheezing. Continue inhalers.    Discharge Condition:  stable   Follow UP   Follow-up Information    Delight Hoh, MD Follow up in 1 week(s).   Specialty: Urology Contact information: [address] [place] [phone]                 Discharge Instructions  and  Discharge Medications    Discharge Instructions    Discharge instructions   Complete by: As directed    Follow with Primary MD/SNF physician  Get CBC, CMP,  checked  by Primary MD next visit.    Activity: As tolerated with Full fall precautions use walker/cane & assistance as needed   Disposition SNF   Diet: Heart Healthy , with feeding assistance  and aspiration precautions.   On your next visit with your primary care physician please Get Medicines reviewed and adjusted.   Please request your Prim.MD to go over all Hospital Tests and Procedure/Radiological results at the follow up, please get all Hospital records sent to your Prim MD by signing hospital release before you go home.   If you experience worsening of your admission symptoms, develop shortness of breath, life threatening emergency, suicidal or homicidal thoughts you must seek medical attention immediately by calling 911 or calling your MD immediately  if symptoms less severe.  You Must read complete instructions/literature along with all the possible adverse reactions/side effects  for all the Medicines you take and that have been prescribed to you. Take any new Medicines after you have completely understood and accpet all the possible adverse reactions/side effects.   Do not drive, operating heavy machinery, perform activities at heights, swimming or participation in water activities or provide baby sitting services if your were admitted for syncope or siezures until you have seen by Primary MD or a Neurologist and advised to do so again.  Do not drive when taking Pain medications.    Do not take more than prescribed Pain, Sleep and Anxiety Medications  Special Instructions: If you have smoked or chewed Tobacco  in the last 2 yrs please stop smoking, stop any regular Alcohol  and or any Recreational drug use.  Wear Seat belts while driving.   Please note  You were cared for by a hospitalist during your hospital stay. If you have any questions about your discharge medications or the care you received while you were in the hospital after you are discharged, you can call the unit and asked to speak with the hospitalist on call if the hospitalist that took care of you is not available. Once you are discharged, your primary care physician will handle any further medical  issues. Please note that NO REFILLS for any discharge medications will be authorized once you are discharged, as it is imperative that you return to your primary care physician (or establish a relationship with a primary care physician if you do not have one) for your aftercare needs so that they can reassess your need for medications and monitor your lab values.   Increase activity slowly   Complete by: As directed      Allergies as of 02/11/2021      Reactions   Bee Venom Anaphylaxis, Hives   Per MAR   Capsicum (cayenne) [cayenne] Swelling   Per MAR   Morphine Nausea And Vomiting   Topiramate Hives      Medication List    STOP taking these medications   aspirin 325 MG EC tablet     TAKE these medications   acetaminophen 325 MG tablet Commonly known as: TYLENOL Take 2 tablets (650 mg total) by mouth every 6 (six) hours as needed for mild pain (or Fever >/= 101).   albuterol (2.5 MG/3ML) 0.083% nebulizer solution Commonly known as: PROVENTIL Take 3 mLs (2.5 mg total) by nebulization every 4 (four) hours as needed for wheezing or shortness of breath.   arformoterol 15 MCG/2ML Nebu Commonly known as: BROVANA Take 2 mLs (15 mcg total) by nebulization 2 (two) times daily.   budesonide 0.5 MG/2ML nebulizer solution Commonly known as: PULMICORT Take 2 mLs (0.5 mg total) by nebulization 2 (two) times daily.   clonazePAM 0.5 MG tablet Commonly known as: KLONOPIN Take 1 tablet (0.5 mg total) by mouth 2 (two) times daily.   docusate sodium 100 MG capsule Commonly known as: COLACE Take 1 capsule (100 mg total) by mouth 2 (two) times daily.   EPINEPHrine 0.3 mg/0.3 mL Soaj injection Commonly known as: EPI-PEN Inject 0.3 mg into the muscle once as needed (for allergic reaction).   feeding supplement Liqd Take 237 mLs by mouth daily.   FLUoxetine 20 MG capsule Commonly known as: PROZAC Take 80 mg by mouth daily.   guaiFENesin 600 MG 12 hr tablet Commonly known as:  MUCINEX Take 1 tablet (600 mg total) by mouth 2 (two) times daily.   metoprolol succinate 25 MG 24 hr tablet Commonly known  as: TOPROL-XL Take 0.5 tablets (12.5 mg total) by mouth daily.   pantoprazole 40 MG tablet Commonly known as: PROTONIX Take 1 tablet (40 mg total) by mouth at bedtime.   rosuvastatin 40 MG tablet Commonly known as: CRESTOR Take 20 mg by mouth daily.   traZODone 100 MG tablet Commonly known as: DESYREL Take 100 mg by mouth at bedtime.   vitamin C 500 MG tablet Commonly known as: ASCORBIC ACID Take 500 mg by mouth in the morning and at bedtime.   Vitamin D 50 MCG (2000 UT) Caps Take 2,000 Units by mouth daily.   warfarin 5 MG tablet Commonly known as: COUMADIN Take 0.5 tablets (2.5 mg total) by mouth daily at 4 PM. Skip today, and start first dose on 02/12/2021 Start taking on: February 12, 2021 What changed: additional instructions         Diet and Activity recommendation: See Discharge Instructions above   Consults obtained -  None   Major procedures and Radiology Reports - PLEASE review detailed and final reports for all details, in brief -      CT HEAD WO CONTRAST  Result Date: 01/30/2021 CLINICAL DATA:  Unwitnessed fall EXAM: CT HEAD WITHOUT CONTRAST TECHNIQUE: Contiguous axial images were obtained from the base of the skull through the vertex without intravenous contrast. COMPARISON:  12/14/2020 FINDINGS: Brain: Large remote left MCA branch infarct affecting the frontal lobe and insula with expected evolution from prior. Small chronic infarct along the high right frontal parietal junction. Confluent chronic small vessel ischemia in the cerebral white matter. No evidence of acute infarct, hemorrhage, hydrocephalus, or swelling. Chronic small vessel ischemic gliosis in the cerebral white matter Vascular: Atherosclerosis and A-comm region aneurysm coiling Skull: Negative for fracture. Sinuses/Orbits: Negative IMPRESSION: 1. No evidence of  intracranial injury. 2. Aneurysm coiling and chronic ischemic injury as described. Electronically Signed   By: Monte Fantasia M.D.   On: 01/30/2021 05:00   CT Angio Chest PE W and/or Wo Contrast  Result Date: 02/07/2021 CLINICAL DATA:  Recent coronavirus infection. Shortness of breath. Assess for pneumonia versus pulmonary emboli. EXAM: CT ANGIOGRAPHY CHEST WITH CONTRAST TECHNIQUE: Multidetector CT imaging of the chest was performed using the standard protocol during bolus administration of intravenous contrast. Multiplanar CT image reconstructions and MIPs were obtained to evaluate the vascular anatomy. CONTRAST:  157mL OMNIPAQUE IOHEXOL 350 MG/ML SOLN COMPARISON:  Radiography same day.  CT chest 12/07/2020. FINDINGS: Cardiovascular: Heart size is normal. Coronary artery calcification and aortic atherosclerotic calcification are present. Pulmonary arterial opacification is excellent. There are no pulmonary emboli. Mediastinum/Nodes: No mediastinal or hilar mass or lymphadenopathy. Large hiatal hernia is present. Lungs/Pleura: Background pattern of emphysema. Left lung is clear except for minimal atelectasis at the base. On the right, there is consolidation/pneumonia within the right lower lobe inferiorly. Few small areas of peripheral patchy density in posterior right upper lobe consistent with minimal pneumonia in that location. These findings have worsened since December. Upper Abdomen: Chronic left renal atrophy and hydronephrosis. Obstructing stone in the proximal left ureter measuring 14 mm in size. Musculoskeletal: Ordinary chronic degenerative changes of the thoracic spine. Old minimal superior endplate depressions at T5 and T11, unchanged. Review of the MIP images confirms the above findings. IMPRESSION: 1. No pulmonary emboli. 2. Background pattern of emphysema. 3. Right lower lobe consolidation/pneumonia, worsened since December. Minimal pneumonia in the posterior inferior right upper lobe. 4.  Coronary artery calcification and aortic atherosclerotic calcification. 5. Large hiatal hernia. 6. Chronic left renal atrophy and  hydronephrosis. Obstructing stone in the proximal left ureter measuring 14 mm in size. 7. Emphysema and aortic atherosclerosis. Aortic Atherosclerosis (ICD10-I70.0) and Emphysema (ICD10-J43.9). Electronically Signed   By: Nelson Chimes M.D.   On: 02/07/2021 00:03   DG Chest Portable 1 View  Result Date: 02/06/2021 CLINICAL DATA:  Recent COVID now experience hypoxia EXAM: PORTABLE CHEST 1 VIEW COMPARISON:  Chest radiograph January 28, 2021 FINDINGS: The heart size and mediastinal contours are unchanged. Hyperinflation with emphysematous and bronchitic change. Slightly increased bronchial thickening. Retrocardiac hiatal her. Diffuse demineralization of bone. Remote left clavicle fracture. IMPRESSION: Chronic changes of COPD with slightly increase in bronchial thickening compared to prior, which may represent bronchitis. Electronically Signed   By: Dahlia Bailiff MD   On: 02/06/2021 18:51   DG Chest Port 1 View  Result Date: 01/28/2021 CLINICAL DATA:  Questionable sepsis - evaluate for abnormality lethargy. Decreased responsiveness. EXAM: PORTABLE CHEST 1 VIEW COMPARISON:  Radiograph and CT 12/07/2020. FINDINGS: Emphysema with chronic hyperinflation. Improved bronchial thickening from prior exam. Stable heart size and mediastinal contours. Aortic atherosclerosis. Retrocardiac hiatal hernia. No acute airspace disease. No pneumothorax or large pleural effusion. No pulmonary edema. Remote left clavicle fracture. Bones are diffusely under mineralized. Patient is rotated. IMPRESSION: Emphysema with chronic hyperinflation. Improved bronchial thickening from prior exam. No acute airspace disease. Electronically Signed   By: Keith Rake M.D.   On: 01/28/2021 19:13    Micro Results    Recent Results (from the past 240 hour(s))  MRSA PCR Screening     Status: None   Collection Time:  02/07/21  3:26 AM   Specimen: Nasal Mucosa; Nasopharyngeal  Result Value Ref Range Status   MRSA by PCR NEGATIVE NEGATIVE Final    Comment:        The GeneXpert MRSA Assay (FDA approved for NASAL specimens only), is one component of a comprehensive MRSA colonization surveillance program. It is not intended to diagnose MRSA infection nor to guide or monitor treatment for MRSA infections. Performed at [facility], [place] [address] [place], [place] [address]        Today   Subjective:   Darely Becknell today with no significant events as discussed with staff, appetite is good.  Objective:   Blood pressure 106/63, pulse 68, temperature 98.5 F (36.9 C), temperature source Oral, resp. rate 14, SpO2 93 %.  No intake or output data in the 24 hours ending 02/11/21 1030  Exam Awake Alert, easily distracted at baseline, pleasant Symmetrical Chest wall movement, Good air movement bilaterally, CTAB RRR,No Gallops,Rubs or new Murmurs, No Parasternal Heave +ve B.Sounds, Abd Soft, Non tender,  No rebound -guarding or rigidity. No Cyanosis, Clubbing or edema, No new Rash or bruise  Data Review   CBC w Diff:  Lab Results  Component Value Date   WBC 6.9 02/11/2021   HGB 10.8 (L) 02/11/2021   HCT 31.6 (L) 02/11/2021   PLT 182 02/11/2021   LYMPHOPCT 32 02/06/2021   MONOPCT 10 02/06/2021   EOSPCT 1 02/06/2021   BASOPCT 1 02/06/2021    CMP:  Lab Results  Component Value Date   NA 141 02/11/2021   K 3.1 (L) 02/11/2021   CL 108 02/11/2021   CO2 24 02/11/2021   BUN 10 02/11/2021   CREATININE 0.96 02/11/2021   PROT 6.7 02/06/2021   ALBUMIN 2.7 (L) 02/06/2021   BILITOT 0.8 02/06/2021   ALKPHOS 87 02/06/2021   AST 55 (H) 02/06/2021   ALT 46 (H) 02/06/2021  .  Total Time in preparing paper work, data evaluation and todays exam - 30 minutes  Phillips Climes M.D on 02/11/2021 at 10:30 AM  [facility]   Office  [phone]

## 2021-02-11 NOTE — Progress Notes (Signed)
[place] for  Lovenox/warfarin Indication: LV thrombus  Allergies  Allergen Reactions  . Bee Venom Anaphylaxis and Hives    Per MAR  . Capsicum (Cayenne) [Cayenne] Swelling    Per MAR  . Morphine Nausea And Vomiting  . Topiramate Hives    Patient Measurements: Heparin dosing weight - 38.2 kg  Vital Signs: Temp: 98.5 F (36.9 C) (02/15 0446) Temp Source: Oral (02/15 0446) BP: 125/62 (02/15 0446) Pulse Rate: 63 (02/15 0446)  Labs: Recent Labs    02/09/21 0318 02/09/21 0508 02/09/21 1415 02/10/21 0155 02/11/21 0130  HGB 11.5*  --   --  11.4* 10.8*  HCT 35.5*  --   --  35.5* 31.6*  PLT 174  --   --  174 182  LABPROT 17.4*  --   --  19.7* 24.4*  INR 1.5*  --   --  1.7* 2.3*  HEPARINUNFRC <0.10* <0.10* <0.10*  --   --   CREATININE 0.65  --   --   --  0.96    Estimated Creatinine Clearance: 33.8 mL/min (by C-G formula based on SCr of 0.96 mg/dL).  Assessment: 57 YOF presented 2/10 with SOB/hypoxia. The patient was reportedly on warfarin PTA for hx LV thrombus and cardio embolic CVA. Note, patient has remote history of hemorrhagic CVA. D-dimer wnl. CTA negative for PE.  INR 1.5>>1.7>2.3 after 2 doses of 2.5 and one dose of 5mg . Question of whether patient was getting warfarin immediately prior to admission due to high INR (per HML). AS patient therapeutic on warfarin will d/c lovenox and lower dose of warfarin today due to rapid rise in INR given h/o SAH.  Can likely resume 2.5mg  daily once stable.   Renal function improving; CBC relatively stable; no bleeding reported.   Goal of Therapy:  INR 2-3 Anti-Xa level 0.6-1 units/ml 4hrs after LMWH dose given  Monitor platelets by anticoagulation protocol: Yes   Plan:  Discontinue Lovenox 40mg  SQ Q12H  Warfarin 1 mg PO x 1  Rawad Bochicchio A. Levada Dy, PharmD, BCPS, FNKF Clinical Pharmacist [place] Please utilize Amion for appropriate phone number to reach the unit pharmacist ([place])   02/11/2021, 7:28 AM

## 2021-02-11 NOTE — Progress Notes (Signed)
Attempted to call report to [place] at [phone].  Remained on hold for more than 12 minutes without a response.  Will attempt to call report later.

## 2021-02-11 NOTE — TOC Transition Note (Signed)
Transition of Care [facility]) - CM/SW Discharge Note   Patient Details  Name: Mindy Mills MRN: [id number] Date of Birth: [birth year]  [facility] CM/SW Contact:  Joanne Chars, LCSW Phone Number: 02/11/2021, 10:52 AM   Clinical Narrative:   Pt discharging to [facility], room 137A.  RN call report to [phone].    Final next level of care: Skilled Nursing Facility Barriers to Discharge: No Barriers Identified   Patient Goals and CMS Choice        Discharge Placement              Patient chooses bed at:  ([facility])   Name of family member notified: daughter, Erasmo Downer Patient and family notified of of transfer: 02/11/21  Discharge Plan and Services                                     Social Determinants of Health (SDOH) Interventions     Readmission Risk Interventions No flowsheet data found.

## 2021-02-11 NOTE — Progress Notes (Signed)
Report given to [place], RN at [facility].

## 2021-02-13 DIAGNOSIS — Z20828 Contact with and (suspected) exposure to other viral communicable diseases: Secondary | ICD-10-CM | POA: Diagnosis not present

## 2021-02-14 DIAGNOSIS — Z79899 Other long term (current) drug therapy: Secondary | ICD-10-CM | POA: Diagnosis not present

## 2021-02-14 DIAGNOSIS — I679 Cerebrovascular disease, unspecified: Secondary | ICD-10-CM | POA: Diagnosis not present

## 2021-02-14 DIAGNOSIS — F32A Depression, unspecified: Secondary | ICD-10-CM | POA: Diagnosis not present

## 2021-02-14 DIAGNOSIS — F419 Anxiety disorder, unspecified: Secondary | ICD-10-CM | POA: Diagnosis not present

## 2021-02-14 DIAGNOSIS — I10 Essential (primary) hypertension: Secondary | ICD-10-CM | POA: Diagnosis not present

## 2021-02-14 DIAGNOSIS — Z8616 Personal history of COVID-19: Secondary | ICD-10-CM | POA: Diagnosis not present

## 2021-02-17 DIAGNOSIS — N201 Calculus of ureter: Secondary | ICD-10-CM | POA: Diagnosis not present

## 2021-02-17 DIAGNOSIS — N132 Hydronephrosis with renal and ureteral calculous obstruction: Secondary | ICD-10-CM | POA: Diagnosis not present

## 2021-02-17 DIAGNOSIS — I63512 Cerebral infarction due to unspecified occlusion or stenosis of left middle cerebral artery: Secondary | ICD-10-CM | POA: Diagnosis not present

## 2021-02-17 DIAGNOSIS — Z5181 Encounter for therapeutic drug level monitoring: Secondary | ICD-10-CM | POA: Diagnosis not present

## 2021-02-17 DIAGNOSIS — Z79899 Other long term (current) drug therapy: Secondary | ICD-10-CM | POA: Diagnosis not present

## 2021-02-17 DIAGNOSIS — Z7901 Long term (current) use of anticoagulants: Secondary | ICD-10-CM | POA: Diagnosis not present

## 2021-02-17 DIAGNOSIS — N261 Atrophy of kidney (terminal): Secondary | ICD-10-CM | POA: Diagnosis not present

## 2021-02-18 DIAGNOSIS — R0602 Shortness of breath: Secondary | ICD-10-CM | POA: Diagnosis not present

## 2021-02-28 DIAGNOSIS — Z23 Encounter for immunization: Secondary | ICD-10-CM | POA: Diagnosis not present

## 2021-03-07 DIAGNOSIS — R419 Unspecified symptoms and signs involving cognitive functions and awareness: Secondary | ICD-10-CM | POA: Diagnosis not present

## 2021-03-07 DIAGNOSIS — F331 Major depressive disorder, recurrent, moderate: Secondary | ICD-10-CM | POA: Diagnosis not present

## 2021-03-17 DIAGNOSIS — F419 Anxiety disorder, unspecified: Secondary | ICD-10-CM | POA: Diagnosis not present

## 2021-03-17 DIAGNOSIS — F32A Depression, unspecified: Secondary | ICD-10-CM | POA: Diagnosis not present

## 2021-03-17 DIAGNOSIS — I10 Essential (primary) hypertension: Secondary | ICD-10-CM | POA: Diagnosis not present

## 2021-03-17 DIAGNOSIS — K5900 Constipation, unspecified: Secondary | ICD-10-CM | POA: Diagnosis not present

## 2021-03-17 DIAGNOSIS — I63512 Cerebral infarction due to unspecified occlusion or stenosis of left middle cerebral artery: Secondary | ICD-10-CM | POA: Diagnosis not present

## 2021-03-25 DIAGNOSIS — I5042 Chronic combined systolic (congestive) and diastolic (congestive) heart failure: Secondary | ICD-10-CM | POA: Diagnosis not present

## 2021-03-25 DIAGNOSIS — N179 Acute kidney failure, unspecified: Secondary | ICD-10-CM | POA: Diagnosis not present

## 2021-03-25 DIAGNOSIS — I69320 Aphasia following cerebral infarction: Secondary | ICD-10-CM | POA: Diagnosis not present

## 2021-03-25 DIAGNOSIS — I69321 Dysphasia following cerebral infarction: Secondary | ICD-10-CM | POA: Diagnosis not present

## 2021-03-25 DIAGNOSIS — I63412 Cerebral infarction due to embolism of left middle cerebral artery: Secondary | ICD-10-CM | POA: Diagnosis not present

## 2021-03-25 DIAGNOSIS — I513 Intracardiac thrombosis, not elsewhere classified: Secondary | ICD-10-CM | POA: Diagnosis not present

## 2021-03-27 DIAGNOSIS — I513 Intracardiac thrombosis, not elsewhere classified: Secondary | ICD-10-CM | POA: Diagnosis not present

## 2021-03-27 DIAGNOSIS — I63412 Cerebral infarction due to embolism of left middle cerebral artery: Secondary | ICD-10-CM | POA: Diagnosis not present

## 2021-03-27 DIAGNOSIS — N179 Acute kidney failure, unspecified: Secondary | ICD-10-CM | POA: Diagnosis not present

## 2021-03-27 DIAGNOSIS — I69320 Aphasia following cerebral infarction: Secondary | ICD-10-CM | POA: Diagnosis not present

## 2021-03-27 DIAGNOSIS — I5042 Chronic combined systolic (congestive) and diastolic (congestive) heart failure: Secondary | ICD-10-CM | POA: Diagnosis not present

## 2021-03-27 DIAGNOSIS — I69321 Dysphasia following cerebral infarction: Secondary | ICD-10-CM | POA: Diagnosis not present

## 2021-03-29 DIAGNOSIS — I513 Intracardiac thrombosis, not elsewhere classified: Secondary | ICD-10-CM | POA: Diagnosis not present

## 2021-03-29 DIAGNOSIS — I69320 Aphasia following cerebral infarction: Secondary | ICD-10-CM | POA: Diagnosis not present

## 2021-03-29 DIAGNOSIS — I69321 Dysphasia following cerebral infarction: Secondary | ICD-10-CM | POA: Diagnosis not present

## 2021-03-29 DIAGNOSIS — I63412 Cerebral infarction due to embolism of left middle cerebral artery: Secondary | ICD-10-CM | POA: Diagnosis not present

## 2021-03-29 DIAGNOSIS — N179 Acute kidney failure, unspecified: Secondary | ICD-10-CM | POA: Diagnosis not present

## 2021-03-29 DIAGNOSIS — I5042 Chronic combined systolic (congestive) and diastolic (congestive) heart failure: Secondary | ICD-10-CM | POA: Diagnosis not present

## 2021-04-01 DIAGNOSIS — F331 Major depressive disorder, recurrent, moderate: Secondary | ICD-10-CM | POA: Diagnosis not present

## 2021-04-01 DIAGNOSIS — I63412 Cerebral infarction due to embolism of left middle cerebral artery: Secondary | ICD-10-CM | POA: Diagnosis not present

## 2021-04-01 DIAGNOSIS — R419 Unspecified symptoms and signs involving cognitive functions and awareness: Secondary | ICD-10-CM | POA: Diagnosis not present

## 2021-04-01 DIAGNOSIS — I69321 Dysphasia following cerebral infarction: Secondary | ICD-10-CM | POA: Diagnosis not present

## 2021-04-01 DIAGNOSIS — I5042 Chronic combined systolic (congestive) and diastolic (congestive) heart failure: Secondary | ICD-10-CM | POA: Diagnosis not present

## 2021-04-01 DIAGNOSIS — I513 Intracardiac thrombosis, not elsewhere classified: Secondary | ICD-10-CM | POA: Diagnosis not present

## 2021-04-01 DIAGNOSIS — I69320 Aphasia following cerebral infarction: Secondary | ICD-10-CM | POA: Diagnosis not present

## 2021-04-01 DIAGNOSIS — N179 Acute kidney failure, unspecified: Secondary | ICD-10-CM | POA: Diagnosis not present

## 2021-04-03 DIAGNOSIS — I5042 Chronic combined systolic (congestive) and diastolic (congestive) heart failure: Secondary | ICD-10-CM | POA: Diagnosis not present

## 2021-04-03 DIAGNOSIS — N179 Acute kidney failure, unspecified: Secondary | ICD-10-CM | POA: Diagnosis not present

## 2021-04-03 DIAGNOSIS — I513 Intracardiac thrombosis, not elsewhere classified: Secondary | ICD-10-CM | POA: Diagnosis not present

## 2021-04-03 DIAGNOSIS — I69320 Aphasia following cerebral infarction: Secondary | ICD-10-CM | POA: Diagnosis not present

## 2021-04-03 DIAGNOSIS — I69321 Dysphasia following cerebral infarction: Secondary | ICD-10-CM | POA: Diagnosis not present

## 2021-04-03 DIAGNOSIS — I63412 Cerebral infarction due to embolism of left middle cerebral artery: Secondary | ICD-10-CM | POA: Diagnosis not present

## 2021-04-07 DIAGNOSIS — I513 Intracardiac thrombosis, not elsewhere classified: Secondary | ICD-10-CM | POA: Diagnosis not present

## 2021-04-07 DIAGNOSIS — I63412 Cerebral infarction due to embolism of left middle cerebral artery: Secondary | ICD-10-CM | POA: Diagnosis not present

## 2021-04-07 DIAGNOSIS — I5042 Chronic combined systolic (congestive) and diastolic (congestive) heart failure: Secondary | ICD-10-CM | POA: Diagnosis not present

## 2021-04-07 DIAGNOSIS — I69320 Aphasia following cerebral infarction: Secondary | ICD-10-CM | POA: Diagnosis not present

## 2021-04-07 DIAGNOSIS — N179 Acute kidney failure, unspecified: Secondary | ICD-10-CM | POA: Diagnosis not present

## 2021-04-07 DIAGNOSIS — I69321 Dysphasia following cerebral infarction: Secondary | ICD-10-CM | POA: Diagnosis not present

## 2021-04-08 DIAGNOSIS — N179 Acute kidney failure, unspecified: Secondary | ICD-10-CM | POA: Diagnosis not present

## 2021-04-08 DIAGNOSIS — I69321 Dysphasia following cerebral infarction: Secondary | ICD-10-CM | POA: Diagnosis not present

## 2021-04-08 DIAGNOSIS — I513 Intracardiac thrombosis, not elsewhere classified: Secondary | ICD-10-CM | POA: Diagnosis not present

## 2021-04-08 DIAGNOSIS — N261 Atrophy of kidney (terminal): Secondary | ICD-10-CM | POA: Diagnosis not present

## 2021-04-08 DIAGNOSIS — N201 Calculus of ureter: Secondary | ICD-10-CM | POA: Diagnosis not present

## 2021-04-08 DIAGNOSIS — N132 Hydronephrosis with renal and ureteral calculous obstruction: Secondary | ICD-10-CM | POA: Diagnosis not present

## 2021-04-08 DIAGNOSIS — I69320 Aphasia following cerebral infarction: Secondary | ICD-10-CM | POA: Diagnosis not present

## 2021-04-08 DIAGNOSIS — I5042 Chronic combined systolic (congestive) and diastolic (congestive) heart failure: Secondary | ICD-10-CM | POA: Diagnosis not present

## 2021-04-08 DIAGNOSIS — I63412 Cerebral infarction due to embolism of left middle cerebral artery: Secondary | ICD-10-CM | POA: Diagnosis not present

## 2021-04-10 DIAGNOSIS — I513 Intracardiac thrombosis, not elsewhere classified: Secondary | ICD-10-CM | POA: Diagnosis not present

## 2021-04-10 DIAGNOSIS — I5042 Chronic combined systolic (congestive) and diastolic (congestive) heart failure: Secondary | ICD-10-CM | POA: Diagnosis not present

## 2021-04-10 DIAGNOSIS — I69320 Aphasia following cerebral infarction: Secondary | ICD-10-CM | POA: Diagnosis not present

## 2021-04-10 DIAGNOSIS — I63412 Cerebral infarction due to embolism of left middle cerebral artery: Secondary | ICD-10-CM | POA: Diagnosis not present

## 2021-04-10 DIAGNOSIS — I69321 Dysphasia following cerebral infarction: Secondary | ICD-10-CM | POA: Diagnosis not present

## 2021-04-10 DIAGNOSIS — N179 Acute kidney failure, unspecified: Secondary | ICD-10-CM | POA: Diagnosis not present

## 2021-04-12 DIAGNOSIS — N179 Acute kidney failure, unspecified: Secondary | ICD-10-CM | POA: Diagnosis not present

## 2021-04-12 DIAGNOSIS — I69320 Aphasia following cerebral infarction: Secondary | ICD-10-CM | POA: Diagnosis not present

## 2021-04-12 DIAGNOSIS — I513 Intracardiac thrombosis, not elsewhere classified: Secondary | ICD-10-CM | POA: Diagnosis not present

## 2021-04-12 DIAGNOSIS — I63412 Cerebral infarction due to embolism of left middle cerebral artery: Secondary | ICD-10-CM | POA: Diagnosis not present

## 2021-04-12 DIAGNOSIS — I69321 Dysphasia following cerebral infarction: Secondary | ICD-10-CM | POA: Diagnosis not present

## 2021-04-12 DIAGNOSIS — I5042 Chronic combined systolic (congestive) and diastolic (congestive) heart failure: Secondary | ICD-10-CM | POA: Diagnosis not present

## 2021-04-14 DIAGNOSIS — Z20828 Contact with and (suspected) exposure to other viral communicable diseases: Secondary | ICD-10-CM | POA: Diagnosis not present

## 2021-04-15 DIAGNOSIS — I63412 Cerebral infarction due to embolism of left middle cerebral artery: Secondary | ICD-10-CM | POA: Diagnosis not present

## 2021-04-15 DIAGNOSIS — E785 Hyperlipidemia, unspecified: Secondary | ICD-10-CM | POA: Diagnosis not present

## 2021-04-15 DIAGNOSIS — F419 Anxiety disorder, unspecified: Secondary | ICD-10-CM | POA: Diagnosis not present

## 2021-04-15 DIAGNOSIS — I513 Intracardiac thrombosis, not elsewhere classified: Secondary | ICD-10-CM | POA: Diagnosis not present

## 2021-04-15 DIAGNOSIS — I5042 Chronic combined systolic (congestive) and diastolic (congestive) heart failure: Secondary | ICD-10-CM | POA: Diagnosis not present

## 2021-04-15 DIAGNOSIS — K5900 Constipation, unspecified: Secondary | ICD-10-CM | POA: Diagnosis not present

## 2021-04-15 DIAGNOSIS — I69320 Aphasia following cerebral infarction: Secondary | ICD-10-CM | POA: Diagnosis not present

## 2021-04-15 DIAGNOSIS — I69321 Dysphasia following cerebral infarction: Secondary | ICD-10-CM | POA: Diagnosis not present

## 2021-04-15 DIAGNOSIS — N179 Acute kidney failure, unspecified: Secondary | ICD-10-CM | POA: Diagnosis not present

## 2021-04-17 DIAGNOSIS — N179 Acute kidney failure, unspecified: Secondary | ICD-10-CM | POA: Diagnosis not present

## 2021-04-17 DIAGNOSIS — I69320 Aphasia following cerebral infarction: Secondary | ICD-10-CM | POA: Diagnosis not present

## 2021-04-17 DIAGNOSIS — I5042 Chronic combined systolic (congestive) and diastolic (congestive) heart failure: Secondary | ICD-10-CM | POA: Diagnosis not present

## 2021-04-17 DIAGNOSIS — I69321 Dysphasia following cerebral infarction: Secondary | ICD-10-CM | POA: Diagnosis not present

## 2021-04-17 DIAGNOSIS — I63412 Cerebral infarction due to embolism of left middle cerebral artery: Secondary | ICD-10-CM | POA: Diagnosis not present

## 2021-04-17 DIAGNOSIS — I513 Intracardiac thrombosis, not elsewhere classified: Secondary | ICD-10-CM | POA: Diagnosis not present

## 2021-04-21 DIAGNOSIS — I513 Intracardiac thrombosis, not elsewhere classified: Secondary | ICD-10-CM | POA: Diagnosis not present

## 2021-04-21 DIAGNOSIS — I69320 Aphasia following cerebral infarction: Secondary | ICD-10-CM | POA: Diagnosis not present

## 2021-04-21 DIAGNOSIS — I5042 Chronic combined systolic (congestive) and diastolic (congestive) heart failure: Secondary | ICD-10-CM | POA: Diagnosis not present

## 2021-04-21 DIAGNOSIS — I69321 Dysphasia following cerebral infarction: Secondary | ICD-10-CM | POA: Diagnosis not present

## 2021-04-21 DIAGNOSIS — N179 Acute kidney failure, unspecified: Secondary | ICD-10-CM | POA: Diagnosis not present

## 2021-04-21 DIAGNOSIS — I63412 Cerebral infarction due to embolism of left middle cerebral artery: Secondary | ICD-10-CM | POA: Diagnosis not present

## 2021-04-22 DIAGNOSIS — I5042 Chronic combined systolic (congestive) and diastolic (congestive) heart failure: Secondary | ICD-10-CM | POA: Diagnosis not present

## 2021-04-22 DIAGNOSIS — I69320 Aphasia following cerebral infarction: Secondary | ICD-10-CM | POA: Diagnosis not present

## 2021-04-22 DIAGNOSIS — I63412 Cerebral infarction due to embolism of left middle cerebral artery: Secondary | ICD-10-CM | POA: Diagnosis not present

## 2021-04-22 DIAGNOSIS — N179 Acute kidney failure, unspecified: Secondary | ICD-10-CM | POA: Diagnosis not present

## 2021-04-22 DIAGNOSIS — I69321 Dysphasia following cerebral infarction: Secondary | ICD-10-CM | POA: Diagnosis not present

## 2021-04-22 DIAGNOSIS — I513 Intracardiac thrombosis, not elsewhere classified: Secondary | ICD-10-CM | POA: Diagnosis not present

## 2021-04-24 DIAGNOSIS — I69321 Dysphasia following cerebral infarction: Secondary | ICD-10-CM | POA: Diagnosis not present

## 2021-04-24 DIAGNOSIS — N179 Acute kidney failure, unspecified: Secondary | ICD-10-CM | POA: Diagnosis not present

## 2021-04-24 DIAGNOSIS — I513 Intracardiac thrombosis, not elsewhere classified: Secondary | ICD-10-CM | POA: Diagnosis not present

## 2021-04-24 DIAGNOSIS — I63412 Cerebral infarction due to embolism of left middle cerebral artery: Secondary | ICD-10-CM | POA: Diagnosis not present

## 2021-04-24 DIAGNOSIS — I5042 Chronic combined systolic (congestive) and diastolic (congestive) heart failure: Secondary | ICD-10-CM | POA: Diagnosis not present

## 2021-04-24 DIAGNOSIS — I69320 Aphasia following cerebral infarction: Secondary | ICD-10-CM | POA: Diagnosis not present

## 2021-04-26 DIAGNOSIS — I5042 Chronic combined systolic (congestive) and diastolic (congestive) heart failure: Secondary | ICD-10-CM | POA: Diagnosis not present

## 2021-04-26 DIAGNOSIS — I69321 Dysphasia following cerebral infarction: Secondary | ICD-10-CM | POA: Diagnosis not present

## 2021-04-26 DIAGNOSIS — N179 Acute kidney failure, unspecified: Secondary | ICD-10-CM | POA: Diagnosis not present

## 2021-04-26 DIAGNOSIS — I69320 Aphasia following cerebral infarction: Secondary | ICD-10-CM | POA: Diagnosis not present

## 2021-04-26 DIAGNOSIS — I63412 Cerebral infarction due to embolism of left middle cerebral artery: Secondary | ICD-10-CM | POA: Diagnosis not present

## 2021-04-26 DIAGNOSIS — I513 Intracardiac thrombosis, not elsewhere classified: Secondary | ICD-10-CM | POA: Diagnosis not present

## 2021-04-28 DIAGNOSIS — I63512 Cerebral infarction due to unspecified occlusion or stenosis of left middle cerebral artery: Secondary | ICD-10-CM | POA: Diagnosis not present

## 2021-04-28 DIAGNOSIS — F419 Anxiety disorder, unspecified: Secondary | ICD-10-CM | POA: Diagnosis not present

## 2021-04-28 DIAGNOSIS — N179 Acute kidney failure, unspecified: Secondary | ICD-10-CM | POA: Diagnosis not present

## 2021-04-28 DIAGNOSIS — I63412 Cerebral infarction due to embolism of left middle cerebral artery: Secondary | ICD-10-CM | POA: Diagnosis not present

## 2021-04-28 DIAGNOSIS — I69321 Dysphasia following cerebral infarction: Secondary | ICD-10-CM | POA: Diagnosis not present

## 2021-04-28 DIAGNOSIS — I513 Intracardiac thrombosis, not elsewhere classified: Secondary | ICD-10-CM | POA: Diagnosis not present

## 2021-04-28 DIAGNOSIS — I69320 Aphasia following cerebral infarction: Secondary | ICD-10-CM | POA: Diagnosis not present

## 2021-04-28 DIAGNOSIS — I5042 Chronic combined systolic (congestive) and diastolic (congestive) heart failure: Secondary | ICD-10-CM | POA: Diagnosis not present

## 2021-04-29 DIAGNOSIS — I69320 Aphasia following cerebral infarction: Secondary | ICD-10-CM | POA: Diagnosis not present

## 2021-04-29 DIAGNOSIS — N179 Acute kidney failure, unspecified: Secondary | ICD-10-CM | POA: Diagnosis not present

## 2021-04-29 DIAGNOSIS — I63412 Cerebral infarction due to embolism of left middle cerebral artery: Secondary | ICD-10-CM | POA: Diagnosis not present

## 2021-04-29 DIAGNOSIS — I513 Intracardiac thrombosis, not elsewhere classified: Secondary | ICD-10-CM | POA: Diagnosis not present

## 2021-04-29 DIAGNOSIS — I69321 Dysphasia following cerebral infarction: Secondary | ICD-10-CM | POA: Diagnosis not present

## 2021-04-29 DIAGNOSIS — I5042 Chronic combined systolic (congestive) and diastolic (congestive) heart failure: Secondary | ICD-10-CM | POA: Diagnosis not present

## 2021-04-30 DIAGNOSIS — I5042 Chronic combined systolic (congestive) and diastolic (congestive) heart failure: Secondary | ICD-10-CM | POA: Diagnosis not present

## 2021-04-30 DIAGNOSIS — I69321 Dysphasia following cerebral infarction: Secondary | ICD-10-CM | POA: Diagnosis not present

## 2021-04-30 DIAGNOSIS — I69320 Aphasia following cerebral infarction: Secondary | ICD-10-CM | POA: Diagnosis not present

## 2021-04-30 DIAGNOSIS — I513 Intracardiac thrombosis, not elsewhere classified: Secondary | ICD-10-CM | POA: Diagnosis not present

## 2021-04-30 DIAGNOSIS — N179 Acute kidney failure, unspecified: Secondary | ICD-10-CM | POA: Diagnosis not present

## 2021-04-30 DIAGNOSIS — I63412 Cerebral infarction due to embolism of left middle cerebral artery: Secondary | ICD-10-CM | POA: Diagnosis not present

## 2021-05-01 DIAGNOSIS — N179 Acute kidney failure, unspecified: Secondary | ICD-10-CM | POA: Diagnosis not present

## 2021-05-01 DIAGNOSIS — I5042 Chronic combined systolic (congestive) and diastolic (congestive) heart failure: Secondary | ICD-10-CM | POA: Diagnosis not present

## 2021-05-01 DIAGNOSIS — I513 Intracardiac thrombosis, not elsewhere classified: Secondary | ICD-10-CM | POA: Diagnosis not present

## 2021-05-01 DIAGNOSIS — I69321 Dysphasia following cerebral infarction: Secondary | ICD-10-CM | POA: Diagnosis not present

## 2021-05-01 DIAGNOSIS — R419 Unspecified symptoms and signs involving cognitive functions and awareness: Secondary | ICD-10-CM | POA: Diagnosis not present

## 2021-05-01 DIAGNOSIS — F331 Major depressive disorder, recurrent, moderate: Secondary | ICD-10-CM | POA: Diagnosis not present

## 2021-05-01 DIAGNOSIS — I69320 Aphasia following cerebral infarction: Secondary | ICD-10-CM | POA: Diagnosis not present

## 2021-05-01 DIAGNOSIS — I63412 Cerebral infarction due to embolism of left middle cerebral artery: Secondary | ICD-10-CM | POA: Diagnosis not present

## 2021-05-06 DIAGNOSIS — I63412 Cerebral infarction due to embolism of left middle cerebral artery: Secondary | ICD-10-CM | POA: Diagnosis not present

## 2021-05-06 DIAGNOSIS — I69320 Aphasia following cerebral infarction: Secondary | ICD-10-CM | POA: Diagnosis not present

## 2021-05-06 DIAGNOSIS — I69321 Dysphasia following cerebral infarction: Secondary | ICD-10-CM | POA: Diagnosis not present

## 2021-05-06 DIAGNOSIS — N179 Acute kidney failure, unspecified: Secondary | ICD-10-CM | POA: Diagnosis not present

## 2021-05-06 DIAGNOSIS — I513 Intracardiac thrombosis, not elsewhere classified: Secondary | ICD-10-CM | POA: Diagnosis not present

## 2021-05-06 DIAGNOSIS — I5042 Chronic combined systolic (congestive) and diastolic (congestive) heart failure: Secondary | ICD-10-CM | POA: Diagnosis not present

## 2021-05-08 DIAGNOSIS — I69320 Aphasia following cerebral infarction: Secondary | ICD-10-CM | POA: Diagnosis not present

## 2021-05-08 DIAGNOSIS — I5042 Chronic combined systolic (congestive) and diastolic (congestive) heart failure: Secondary | ICD-10-CM | POA: Diagnosis not present

## 2021-05-08 DIAGNOSIS — N179 Acute kidney failure, unspecified: Secondary | ICD-10-CM | POA: Diagnosis not present

## 2021-05-08 DIAGNOSIS — I63412 Cerebral infarction due to embolism of left middle cerebral artery: Secondary | ICD-10-CM | POA: Diagnosis not present

## 2021-05-08 DIAGNOSIS — I69321 Dysphasia following cerebral infarction: Secondary | ICD-10-CM | POA: Diagnosis not present

## 2021-05-08 DIAGNOSIS — I513 Intracardiac thrombosis, not elsewhere classified: Secondary | ICD-10-CM | POA: Diagnosis not present

## 2021-05-12 DIAGNOSIS — I69320 Aphasia following cerebral infarction: Secondary | ICD-10-CM | POA: Diagnosis not present

## 2021-05-12 DIAGNOSIS — I69321 Dysphasia following cerebral infarction: Secondary | ICD-10-CM | POA: Diagnosis not present

## 2021-05-12 DIAGNOSIS — I10 Essential (primary) hypertension: Secondary | ICD-10-CM | POA: Diagnosis not present

## 2021-05-12 DIAGNOSIS — K219 Gastro-esophageal reflux disease without esophagitis: Secondary | ICD-10-CM | POA: Diagnosis not present

## 2021-05-12 DIAGNOSIS — E785 Hyperlipidemia, unspecified: Secondary | ICD-10-CM | POA: Diagnosis not present

## 2021-05-12 DIAGNOSIS — I513 Intracardiac thrombosis, not elsewhere classified: Secondary | ICD-10-CM | POA: Diagnosis not present

## 2021-05-12 DIAGNOSIS — I63412 Cerebral infarction due to embolism of left middle cerebral artery: Secondary | ICD-10-CM | POA: Diagnosis not present

## 2021-05-12 DIAGNOSIS — F419 Anxiety disorder, unspecified: Secondary | ICD-10-CM | POA: Diagnosis not present

## 2021-05-12 DIAGNOSIS — I5042 Chronic combined systolic (congestive) and diastolic (congestive) heart failure: Secondary | ICD-10-CM | POA: Diagnosis not present

## 2021-05-12 DIAGNOSIS — F32A Depression, unspecified: Secondary | ICD-10-CM | POA: Diagnosis not present

## 2021-05-12 DIAGNOSIS — N179 Acute kidney failure, unspecified: Secondary | ICD-10-CM | POA: Diagnosis not present

## 2021-05-12 DIAGNOSIS — I63512 Cerebral infarction due to unspecified occlusion or stenosis of left middle cerebral artery: Secondary | ICD-10-CM | POA: Diagnosis not present

## 2021-05-14 DIAGNOSIS — N179 Acute kidney failure, unspecified: Secondary | ICD-10-CM | POA: Diagnosis not present

## 2021-05-14 DIAGNOSIS — I69321 Dysphasia following cerebral infarction: Secondary | ICD-10-CM | POA: Diagnosis not present

## 2021-05-14 DIAGNOSIS — I69320 Aphasia following cerebral infarction: Secondary | ICD-10-CM | POA: Diagnosis not present

## 2021-05-14 DIAGNOSIS — I5042 Chronic combined systolic (congestive) and diastolic (congestive) heart failure: Secondary | ICD-10-CM | POA: Diagnosis not present

## 2021-05-14 DIAGNOSIS — I63412 Cerebral infarction due to embolism of left middle cerebral artery: Secondary | ICD-10-CM | POA: Diagnosis not present

## 2021-05-14 DIAGNOSIS — I513 Intracardiac thrombosis, not elsewhere classified: Secondary | ICD-10-CM | POA: Diagnosis not present

## 2021-05-16 DIAGNOSIS — I63512 Cerebral infarction due to unspecified occlusion or stenosis of left middle cerebral artery: Secondary | ICD-10-CM | POA: Diagnosis not present

## 2021-05-16 DIAGNOSIS — F419 Anxiety disorder, unspecified: Secondary | ICD-10-CM | POA: Diagnosis not present

## 2021-05-16 DIAGNOSIS — I5042 Chronic combined systolic (congestive) and diastolic (congestive) heart failure: Secondary | ICD-10-CM | POA: Diagnosis not present

## 2021-05-16 DIAGNOSIS — I69321 Dysphasia following cerebral infarction: Secondary | ICD-10-CM | POA: Diagnosis not present

## 2021-05-16 DIAGNOSIS — I513 Intracardiac thrombosis, not elsewhere classified: Secondary | ICD-10-CM | POA: Diagnosis not present

## 2021-05-16 DIAGNOSIS — W19XXXD Unspecified fall, subsequent encounter: Secondary | ICD-10-CM | POA: Diagnosis not present

## 2021-05-16 DIAGNOSIS — I10 Essential (primary) hypertension: Secondary | ICD-10-CM | POA: Diagnosis not present

## 2021-05-16 DIAGNOSIS — I63412 Cerebral infarction due to embolism of left middle cerebral artery: Secondary | ICD-10-CM | POA: Diagnosis not present

## 2021-05-16 DIAGNOSIS — N179 Acute kidney failure, unspecified: Secondary | ICD-10-CM | POA: Diagnosis not present

## 2021-05-16 DIAGNOSIS — F32A Depression, unspecified: Secondary | ICD-10-CM | POA: Diagnosis not present

## 2021-05-16 DIAGNOSIS — I69320 Aphasia following cerebral infarction: Secondary | ICD-10-CM | POA: Diagnosis not present

## 2021-05-18 DIAGNOSIS — I63412 Cerebral infarction due to embolism of left middle cerebral artery: Secondary | ICD-10-CM | POA: Diagnosis not present

## 2021-05-18 DIAGNOSIS — I69320 Aphasia following cerebral infarction: Secondary | ICD-10-CM | POA: Diagnosis not present

## 2021-05-18 DIAGNOSIS — I69321 Dysphasia following cerebral infarction: Secondary | ICD-10-CM | POA: Diagnosis not present

## 2021-05-18 DIAGNOSIS — N179 Acute kidney failure, unspecified: Secondary | ICD-10-CM | POA: Diagnosis not present

## 2021-05-18 DIAGNOSIS — I5042 Chronic combined systolic (congestive) and diastolic (congestive) heart failure: Secondary | ICD-10-CM | POA: Diagnosis not present

## 2021-05-18 DIAGNOSIS — I513 Intracardiac thrombosis, not elsewhere classified: Secondary | ICD-10-CM | POA: Diagnosis not present

## 2021-05-20 DIAGNOSIS — I5042 Chronic combined systolic (congestive) and diastolic (congestive) heart failure: Secondary | ICD-10-CM | POA: Diagnosis not present

## 2021-05-20 DIAGNOSIS — N179 Acute kidney failure, unspecified: Secondary | ICD-10-CM | POA: Diagnosis not present

## 2021-05-20 DIAGNOSIS — I63412 Cerebral infarction due to embolism of left middle cerebral artery: Secondary | ICD-10-CM | POA: Diagnosis not present

## 2021-05-20 DIAGNOSIS — I513 Intracardiac thrombosis, not elsewhere classified: Secondary | ICD-10-CM | POA: Diagnosis not present

## 2021-05-20 DIAGNOSIS — I69321 Dysphasia following cerebral infarction: Secondary | ICD-10-CM | POA: Diagnosis not present

## 2021-05-20 DIAGNOSIS — I69320 Aphasia following cerebral infarction: Secondary | ICD-10-CM | POA: Diagnosis not present

## 2021-05-22 DIAGNOSIS — I5042 Chronic combined systolic (congestive) and diastolic (congestive) heart failure: Secondary | ICD-10-CM | POA: Diagnosis not present

## 2021-05-22 DIAGNOSIS — I513 Intracardiac thrombosis, not elsewhere classified: Secondary | ICD-10-CM | POA: Diagnosis not present

## 2021-05-22 DIAGNOSIS — I63412 Cerebral infarction due to embolism of left middle cerebral artery: Secondary | ICD-10-CM | POA: Diagnosis not present

## 2021-05-22 DIAGNOSIS — I69320 Aphasia following cerebral infarction: Secondary | ICD-10-CM | POA: Diagnosis not present

## 2021-05-22 DIAGNOSIS — I69321 Dysphasia following cerebral infarction: Secondary | ICD-10-CM | POA: Diagnosis not present

## 2021-05-22 DIAGNOSIS — N179 Acute kidney failure, unspecified: Secondary | ICD-10-CM | POA: Diagnosis not present

## 2021-05-24 DIAGNOSIS — I69320 Aphasia following cerebral infarction: Secondary | ICD-10-CM | POA: Diagnosis not present

## 2021-05-24 DIAGNOSIS — I69321 Dysphasia following cerebral infarction: Secondary | ICD-10-CM | POA: Diagnosis not present

## 2021-05-24 DIAGNOSIS — I63412 Cerebral infarction due to embolism of left middle cerebral artery: Secondary | ICD-10-CM | POA: Diagnosis not present

## 2021-05-24 DIAGNOSIS — I5042 Chronic combined systolic (congestive) and diastolic (congestive) heart failure: Secondary | ICD-10-CM | POA: Diagnosis not present

## 2021-05-24 DIAGNOSIS — I513 Intracardiac thrombosis, not elsewhere classified: Secondary | ICD-10-CM | POA: Diagnosis not present

## 2021-05-24 DIAGNOSIS — N179 Acute kidney failure, unspecified: Secondary | ICD-10-CM | POA: Diagnosis not present

## 2021-05-26 DIAGNOSIS — F419 Anxiety disorder, unspecified: Secondary | ICD-10-CM | POA: Diagnosis not present

## 2021-05-26 DIAGNOSIS — I6930 Unspecified sequelae of cerebral infarction: Secondary | ICD-10-CM | POA: Diagnosis not present

## 2021-05-26 DIAGNOSIS — W19XXXD Unspecified fall, subsequent encounter: Secondary | ICD-10-CM | POA: Diagnosis not present

## 2021-05-26 DIAGNOSIS — Z79899 Other long term (current) drug therapy: Secondary | ICD-10-CM | POA: Diagnosis not present

## 2021-05-26 DIAGNOSIS — I10 Essential (primary) hypertension: Secondary | ICD-10-CM | POA: Diagnosis not present

## 2021-05-27 DIAGNOSIS — I69320 Aphasia following cerebral infarction: Secondary | ICD-10-CM | POA: Diagnosis not present

## 2021-05-27 DIAGNOSIS — I513 Intracardiac thrombosis, not elsewhere classified: Secondary | ICD-10-CM | POA: Diagnosis not present

## 2021-05-27 DIAGNOSIS — I5042 Chronic combined systolic (congestive) and diastolic (congestive) heart failure: Secondary | ICD-10-CM | POA: Diagnosis not present

## 2021-05-27 DIAGNOSIS — I69321 Dysphasia following cerebral infarction: Secondary | ICD-10-CM | POA: Diagnosis not present

## 2021-05-27 DIAGNOSIS — I63412 Cerebral infarction due to embolism of left middle cerebral artery: Secondary | ICD-10-CM | POA: Diagnosis not present

## 2021-05-27 DIAGNOSIS — N179 Acute kidney failure, unspecified: Secondary | ICD-10-CM | POA: Diagnosis not present

## 2021-05-29 DIAGNOSIS — M6281 Muscle weakness (generalized): Secondary | ICD-10-CM | POA: Diagnosis not present

## 2021-05-29 DIAGNOSIS — I69321 Dysphasia following cerebral infarction: Secondary | ICD-10-CM | POA: Diagnosis not present

## 2021-05-29 DIAGNOSIS — I69351 Hemiplegia and hemiparesis following cerebral infarction affecting right dominant side: Secondary | ICD-10-CM | POA: Diagnosis not present

## 2021-05-29 DIAGNOSIS — I63512 Cerebral infarction due to unspecified occlusion or stenosis of left middle cerebral artery: Secondary | ICD-10-CM | POA: Diagnosis not present

## 2021-05-29 DIAGNOSIS — N179 Acute kidney failure, unspecified: Secondary | ICD-10-CM | POA: Diagnosis not present

## 2021-05-29 DIAGNOSIS — I63412 Cerebral infarction due to embolism of left middle cerebral artery: Secondary | ICD-10-CM | POA: Diagnosis not present

## 2021-05-29 DIAGNOSIS — I69320 Aphasia following cerebral infarction: Secondary | ICD-10-CM | POA: Diagnosis not present

## 2021-05-29 DIAGNOSIS — I5042 Chronic combined systolic (congestive) and diastolic (congestive) heart failure: Secondary | ICD-10-CM | POA: Diagnosis not present

## 2021-05-29 DIAGNOSIS — I513 Intracardiac thrombosis, not elsewhere classified: Secondary | ICD-10-CM | POA: Diagnosis not present

## 2021-06-02 DIAGNOSIS — N179 Acute kidney failure, unspecified: Secondary | ICD-10-CM | POA: Diagnosis not present

## 2021-06-02 DIAGNOSIS — M6281 Muscle weakness (generalized): Secondary | ICD-10-CM | POA: Diagnosis not present

## 2021-06-02 DIAGNOSIS — I63412 Cerebral infarction due to embolism of left middle cerebral artery: Secondary | ICD-10-CM | POA: Diagnosis not present

## 2021-06-02 DIAGNOSIS — I69321 Dysphasia following cerebral infarction: Secondary | ICD-10-CM | POA: Diagnosis not present

## 2021-06-02 DIAGNOSIS — I513 Intracardiac thrombosis, not elsewhere classified: Secondary | ICD-10-CM | POA: Diagnosis not present

## 2021-06-02 DIAGNOSIS — I5042 Chronic combined systolic (congestive) and diastolic (congestive) heart failure: Secondary | ICD-10-CM | POA: Diagnosis not present

## 2021-06-02 DIAGNOSIS — I63512 Cerebral infarction due to unspecified occlusion or stenosis of left middle cerebral artery: Secondary | ICD-10-CM | POA: Diagnosis not present

## 2021-06-02 DIAGNOSIS — I69351 Hemiplegia and hemiparesis following cerebral infarction affecting right dominant side: Secondary | ICD-10-CM | POA: Diagnosis not present

## 2021-06-02 DIAGNOSIS — I69320 Aphasia following cerebral infarction: Secondary | ICD-10-CM | POA: Diagnosis not present

## 2021-06-03 DIAGNOSIS — I5042 Chronic combined systolic (congestive) and diastolic (congestive) heart failure: Secondary | ICD-10-CM | POA: Diagnosis not present

## 2021-06-03 DIAGNOSIS — I63512 Cerebral infarction due to unspecified occlusion or stenosis of left middle cerebral artery: Secondary | ICD-10-CM | POA: Diagnosis not present

## 2021-06-03 DIAGNOSIS — N179 Acute kidney failure, unspecified: Secondary | ICD-10-CM | POA: Diagnosis not present

## 2021-06-03 DIAGNOSIS — I63412 Cerebral infarction due to embolism of left middle cerebral artery: Secondary | ICD-10-CM | POA: Diagnosis not present

## 2021-06-03 DIAGNOSIS — I69320 Aphasia following cerebral infarction: Secondary | ICD-10-CM | POA: Diagnosis not present

## 2021-06-03 DIAGNOSIS — I69351 Hemiplegia and hemiparesis following cerebral infarction affecting right dominant side: Secondary | ICD-10-CM | POA: Diagnosis not present

## 2021-06-03 DIAGNOSIS — I69321 Dysphasia following cerebral infarction: Secondary | ICD-10-CM | POA: Diagnosis not present

## 2021-06-03 DIAGNOSIS — I513 Intracardiac thrombosis, not elsewhere classified: Secondary | ICD-10-CM | POA: Diagnosis not present

## 2021-06-03 DIAGNOSIS — M6281 Muscle weakness (generalized): Secondary | ICD-10-CM | POA: Diagnosis not present

## 2021-06-05 DIAGNOSIS — I513 Intracardiac thrombosis, not elsewhere classified: Secondary | ICD-10-CM | POA: Diagnosis not present

## 2021-06-05 DIAGNOSIS — I5042 Chronic combined systolic (congestive) and diastolic (congestive) heart failure: Secondary | ICD-10-CM | POA: Diagnosis not present

## 2021-06-05 DIAGNOSIS — I69321 Dysphasia following cerebral infarction: Secondary | ICD-10-CM | POA: Diagnosis not present

## 2021-06-05 DIAGNOSIS — I63512 Cerebral infarction due to unspecified occlusion or stenosis of left middle cerebral artery: Secondary | ICD-10-CM | POA: Diagnosis not present

## 2021-06-05 DIAGNOSIS — I10 Essential (primary) hypertension: Secondary | ICD-10-CM | POA: Diagnosis not present

## 2021-06-05 DIAGNOSIS — M6281 Muscle weakness (generalized): Secondary | ICD-10-CM | POA: Diagnosis not present

## 2021-06-05 DIAGNOSIS — I69351 Hemiplegia and hemiparesis following cerebral infarction affecting right dominant side: Secondary | ICD-10-CM | POA: Diagnosis not present

## 2021-06-05 DIAGNOSIS — I63412 Cerebral infarction due to embolism of left middle cerebral artery: Secondary | ICD-10-CM | POA: Diagnosis not present

## 2021-06-05 DIAGNOSIS — I69320 Aphasia following cerebral infarction: Secondary | ICD-10-CM | POA: Diagnosis not present

## 2021-06-05 DIAGNOSIS — N179 Acute kidney failure, unspecified: Secondary | ICD-10-CM | POA: Diagnosis not present

## 2021-06-06 DIAGNOSIS — I69351 Hemiplegia and hemiparesis following cerebral infarction affecting right dominant side: Secondary | ICD-10-CM | POA: Diagnosis not present

## 2021-06-06 DIAGNOSIS — M6281 Muscle weakness (generalized): Secondary | ICD-10-CM | POA: Diagnosis not present

## 2021-06-06 DIAGNOSIS — I69321 Dysphasia following cerebral infarction: Secondary | ICD-10-CM | POA: Diagnosis not present

## 2021-06-06 DIAGNOSIS — I10 Essential (primary) hypertension: Secondary | ICD-10-CM | POA: Diagnosis not present

## 2021-06-06 DIAGNOSIS — N179 Acute kidney failure, unspecified: Secondary | ICD-10-CM | POA: Diagnosis not present

## 2021-06-06 DIAGNOSIS — I63412 Cerebral infarction due to embolism of left middle cerebral artery: Secondary | ICD-10-CM | POA: Diagnosis not present

## 2021-06-06 DIAGNOSIS — I513 Intracardiac thrombosis, not elsewhere classified: Secondary | ICD-10-CM | POA: Diagnosis not present

## 2021-06-06 DIAGNOSIS — I63512 Cerebral infarction due to unspecified occlusion or stenosis of left middle cerebral artery: Secondary | ICD-10-CM | POA: Diagnosis not present

## 2021-06-06 DIAGNOSIS — I69320 Aphasia following cerebral infarction: Secondary | ICD-10-CM | POA: Diagnosis not present

## 2021-06-06 DIAGNOSIS — I5042 Chronic combined systolic (congestive) and diastolic (congestive) heart failure: Secondary | ICD-10-CM | POA: Diagnosis not present

## 2021-06-07 DIAGNOSIS — I63512 Cerebral infarction due to unspecified occlusion or stenosis of left middle cerebral artery: Secondary | ICD-10-CM | POA: Diagnosis not present

## 2021-06-07 DIAGNOSIS — I69351 Hemiplegia and hemiparesis following cerebral infarction affecting right dominant side: Secondary | ICD-10-CM | POA: Diagnosis not present

## 2021-06-07 DIAGNOSIS — I63412 Cerebral infarction due to embolism of left middle cerebral artery: Secondary | ICD-10-CM | POA: Diagnosis not present

## 2021-06-07 DIAGNOSIS — I69320 Aphasia following cerebral infarction: Secondary | ICD-10-CM | POA: Diagnosis not present

## 2021-06-07 DIAGNOSIS — I513 Intracardiac thrombosis, not elsewhere classified: Secondary | ICD-10-CM | POA: Diagnosis not present

## 2021-06-07 DIAGNOSIS — N179 Acute kidney failure, unspecified: Secondary | ICD-10-CM | POA: Diagnosis not present

## 2021-06-07 DIAGNOSIS — I5042 Chronic combined systolic (congestive) and diastolic (congestive) heart failure: Secondary | ICD-10-CM | POA: Diagnosis not present

## 2021-06-07 DIAGNOSIS — M6281 Muscle weakness (generalized): Secondary | ICD-10-CM | POA: Diagnosis not present

## 2021-06-07 DIAGNOSIS — I69321 Dysphasia following cerebral infarction: Secondary | ICD-10-CM | POA: Diagnosis not present

## 2021-06-10 DIAGNOSIS — I69320 Aphasia following cerebral infarction: Secondary | ICD-10-CM | POA: Diagnosis not present

## 2021-06-10 DIAGNOSIS — I69351 Hemiplegia and hemiparesis following cerebral infarction affecting right dominant side: Secondary | ICD-10-CM | POA: Diagnosis not present

## 2021-06-10 DIAGNOSIS — I63412 Cerebral infarction due to embolism of left middle cerebral artery: Secondary | ICD-10-CM | POA: Diagnosis not present

## 2021-06-10 DIAGNOSIS — I513 Intracardiac thrombosis, not elsewhere classified: Secondary | ICD-10-CM | POA: Diagnosis not present

## 2021-06-10 DIAGNOSIS — N179 Acute kidney failure, unspecified: Secondary | ICD-10-CM | POA: Diagnosis not present

## 2021-06-10 DIAGNOSIS — I63512 Cerebral infarction due to unspecified occlusion or stenosis of left middle cerebral artery: Secondary | ICD-10-CM | POA: Diagnosis not present

## 2021-06-10 DIAGNOSIS — I69321 Dysphasia following cerebral infarction: Secondary | ICD-10-CM | POA: Diagnosis not present

## 2021-06-10 DIAGNOSIS — M6281 Muscle weakness (generalized): Secondary | ICD-10-CM | POA: Diagnosis not present

## 2021-06-10 DIAGNOSIS — I5042 Chronic combined systolic (congestive) and diastolic (congestive) heart failure: Secondary | ICD-10-CM | POA: Diagnosis not present

## 2021-06-11 DIAGNOSIS — I69321 Dysphasia following cerebral infarction: Secondary | ICD-10-CM | POA: Diagnosis not present

## 2021-06-11 DIAGNOSIS — I513 Intracardiac thrombosis, not elsewhere classified: Secondary | ICD-10-CM | POA: Diagnosis not present

## 2021-06-11 DIAGNOSIS — I5042 Chronic combined systolic (congestive) and diastolic (congestive) heart failure: Secondary | ICD-10-CM | POA: Diagnosis not present

## 2021-06-11 DIAGNOSIS — I69320 Aphasia following cerebral infarction: Secondary | ICD-10-CM | POA: Diagnosis not present

## 2021-06-11 DIAGNOSIS — I69351 Hemiplegia and hemiparesis following cerebral infarction affecting right dominant side: Secondary | ICD-10-CM | POA: Diagnosis not present

## 2021-06-11 DIAGNOSIS — I63512 Cerebral infarction due to unspecified occlusion or stenosis of left middle cerebral artery: Secondary | ICD-10-CM | POA: Diagnosis not present

## 2021-06-11 DIAGNOSIS — N179 Acute kidney failure, unspecified: Secondary | ICD-10-CM | POA: Diagnosis not present

## 2021-06-11 DIAGNOSIS — M6281 Muscle weakness (generalized): Secondary | ICD-10-CM | POA: Diagnosis not present

## 2021-06-11 DIAGNOSIS — I63412 Cerebral infarction due to embolism of left middle cerebral artery: Secondary | ICD-10-CM | POA: Diagnosis not present

## 2021-06-12 DIAGNOSIS — M6281 Muscle weakness (generalized): Secondary | ICD-10-CM | POA: Diagnosis not present

## 2021-06-12 DIAGNOSIS — I63512 Cerebral infarction due to unspecified occlusion or stenosis of left middle cerebral artery: Secondary | ICD-10-CM | POA: Diagnosis not present

## 2021-06-12 DIAGNOSIS — F331 Major depressive disorder, recurrent, moderate: Secondary | ICD-10-CM | POA: Diagnosis not present

## 2021-06-12 DIAGNOSIS — I69321 Dysphasia following cerebral infarction: Secondary | ICD-10-CM | POA: Diagnosis not present

## 2021-06-12 DIAGNOSIS — I69320 Aphasia following cerebral infarction: Secondary | ICD-10-CM | POA: Diagnosis not present

## 2021-06-12 DIAGNOSIS — I5042 Chronic combined systolic (congestive) and diastolic (congestive) heart failure: Secondary | ICD-10-CM | POA: Diagnosis not present

## 2021-06-12 DIAGNOSIS — I513 Intracardiac thrombosis, not elsewhere classified: Secondary | ICD-10-CM | POA: Diagnosis not present

## 2021-06-12 DIAGNOSIS — R419 Unspecified symptoms and signs involving cognitive functions and awareness: Secondary | ICD-10-CM | POA: Diagnosis not present

## 2021-06-12 DIAGNOSIS — N179 Acute kidney failure, unspecified: Secondary | ICD-10-CM | POA: Diagnosis not present

## 2021-06-12 DIAGNOSIS — I63412 Cerebral infarction due to embolism of left middle cerebral artery: Secondary | ICD-10-CM | POA: Diagnosis not present

## 2021-06-12 DIAGNOSIS — I69351 Hemiplegia and hemiparesis following cerebral infarction affecting right dominant side: Secondary | ICD-10-CM | POA: Diagnosis not present

## 2021-06-13 DIAGNOSIS — I69320 Aphasia following cerebral infarction: Secondary | ICD-10-CM | POA: Diagnosis not present

## 2021-06-13 DIAGNOSIS — W19XXXD Unspecified fall, subsequent encounter: Secondary | ICD-10-CM | POA: Diagnosis not present

## 2021-06-13 DIAGNOSIS — Z79899 Other long term (current) drug therapy: Secondary | ICD-10-CM | POA: Diagnosis not present

## 2021-06-13 DIAGNOSIS — N179 Acute kidney failure, unspecified: Secondary | ICD-10-CM | POA: Diagnosis not present

## 2021-06-13 DIAGNOSIS — I513 Intracardiac thrombosis, not elsewhere classified: Secondary | ICD-10-CM | POA: Diagnosis not present

## 2021-06-13 DIAGNOSIS — I63512 Cerebral infarction due to unspecified occlusion or stenosis of left middle cerebral artery: Secondary | ICD-10-CM | POA: Diagnosis not present

## 2021-06-13 DIAGNOSIS — I69321 Dysphasia following cerebral infarction: Secondary | ICD-10-CM | POA: Diagnosis not present

## 2021-06-13 DIAGNOSIS — I5042 Chronic combined systolic (congestive) and diastolic (congestive) heart failure: Secondary | ICD-10-CM | POA: Diagnosis not present

## 2021-06-13 DIAGNOSIS — F419 Anxiety disorder, unspecified: Secondary | ICD-10-CM | POA: Diagnosis not present

## 2021-06-13 DIAGNOSIS — F32A Depression, unspecified: Secondary | ICD-10-CM | POA: Diagnosis not present

## 2021-06-13 DIAGNOSIS — I69351 Hemiplegia and hemiparesis following cerebral infarction affecting right dominant side: Secondary | ICD-10-CM | POA: Diagnosis not present

## 2021-06-13 DIAGNOSIS — M6281 Muscle weakness (generalized): Secondary | ICD-10-CM | POA: Diagnosis not present

## 2021-06-13 DIAGNOSIS — I63412 Cerebral infarction due to embolism of left middle cerebral artery: Secondary | ICD-10-CM | POA: Diagnosis not present

## 2021-06-15 DIAGNOSIS — I69351 Hemiplegia and hemiparesis following cerebral infarction affecting right dominant side: Secondary | ICD-10-CM | POA: Diagnosis not present

## 2021-06-15 DIAGNOSIS — M6281 Muscle weakness (generalized): Secondary | ICD-10-CM | POA: Diagnosis not present

## 2021-06-15 DIAGNOSIS — I5042 Chronic combined systolic (congestive) and diastolic (congestive) heart failure: Secondary | ICD-10-CM | POA: Diagnosis not present

## 2021-06-15 DIAGNOSIS — N179 Acute kidney failure, unspecified: Secondary | ICD-10-CM | POA: Diagnosis not present

## 2021-06-15 DIAGNOSIS — I69320 Aphasia following cerebral infarction: Secondary | ICD-10-CM | POA: Diagnosis not present

## 2021-06-15 DIAGNOSIS — I63412 Cerebral infarction due to embolism of left middle cerebral artery: Secondary | ICD-10-CM | POA: Diagnosis not present

## 2021-06-15 DIAGNOSIS — I513 Intracardiac thrombosis, not elsewhere classified: Secondary | ICD-10-CM | POA: Diagnosis not present

## 2021-06-15 DIAGNOSIS — I69321 Dysphasia following cerebral infarction: Secondary | ICD-10-CM | POA: Diagnosis not present

## 2021-06-15 DIAGNOSIS — I63512 Cerebral infarction due to unspecified occlusion or stenosis of left middle cerebral artery: Secondary | ICD-10-CM | POA: Diagnosis not present

## 2021-06-16 DIAGNOSIS — I69351 Hemiplegia and hemiparesis following cerebral infarction affecting right dominant side: Secondary | ICD-10-CM | POA: Diagnosis not present

## 2021-06-16 DIAGNOSIS — I69321 Dysphasia following cerebral infarction: Secondary | ICD-10-CM | POA: Diagnosis not present

## 2021-06-16 DIAGNOSIS — I63512 Cerebral infarction due to unspecified occlusion or stenosis of left middle cerebral artery: Secondary | ICD-10-CM | POA: Diagnosis not present

## 2021-06-16 DIAGNOSIS — I513 Intracardiac thrombosis, not elsewhere classified: Secondary | ICD-10-CM | POA: Diagnosis not present

## 2021-06-16 DIAGNOSIS — W19XXXD Unspecified fall, subsequent encounter: Secondary | ICD-10-CM | POA: Diagnosis not present

## 2021-06-16 DIAGNOSIS — F419 Anxiety disorder, unspecified: Secondary | ICD-10-CM | POA: Diagnosis not present

## 2021-06-16 DIAGNOSIS — F32A Depression, unspecified: Secondary | ICD-10-CM | POA: Diagnosis not present

## 2021-06-16 DIAGNOSIS — I5042 Chronic combined systolic (congestive) and diastolic (congestive) heart failure: Secondary | ICD-10-CM | POA: Diagnosis not present

## 2021-06-16 DIAGNOSIS — I10 Essential (primary) hypertension: Secondary | ICD-10-CM | POA: Diagnosis not present

## 2021-06-16 DIAGNOSIS — N179 Acute kidney failure, unspecified: Secondary | ICD-10-CM | POA: Diagnosis not present

## 2021-06-16 DIAGNOSIS — M6281 Muscle weakness (generalized): Secondary | ICD-10-CM | POA: Diagnosis not present

## 2021-06-16 DIAGNOSIS — I63412 Cerebral infarction due to embolism of left middle cerebral artery: Secondary | ICD-10-CM | POA: Diagnosis not present

## 2021-06-16 DIAGNOSIS — I69320 Aphasia following cerebral infarction: Secondary | ICD-10-CM | POA: Diagnosis not present

## 2021-06-16 DIAGNOSIS — G4700 Insomnia, unspecified: Secondary | ICD-10-CM | POA: Diagnosis not present

## 2021-06-17 DIAGNOSIS — I513 Intracardiac thrombosis, not elsewhere classified: Secondary | ICD-10-CM | POA: Diagnosis not present

## 2021-06-17 DIAGNOSIS — I5042 Chronic combined systolic (congestive) and diastolic (congestive) heart failure: Secondary | ICD-10-CM | POA: Diagnosis not present

## 2021-06-17 DIAGNOSIS — I63512 Cerebral infarction due to unspecified occlusion or stenosis of left middle cerebral artery: Secondary | ICD-10-CM | POA: Diagnosis not present

## 2021-06-17 DIAGNOSIS — N179 Acute kidney failure, unspecified: Secondary | ICD-10-CM | POA: Diagnosis not present

## 2021-06-17 DIAGNOSIS — I63412 Cerebral infarction due to embolism of left middle cerebral artery: Secondary | ICD-10-CM | POA: Diagnosis not present

## 2021-06-17 DIAGNOSIS — I69351 Hemiplegia and hemiparesis following cerebral infarction affecting right dominant side: Secondary | ICD-10-CM | POA: Diagnosis not present

## 2021-06-17 DIAGNOSIS — M6281 Muscle weakness (generalized): Secondary | ICD-10-CM | POA: Diagnosis not present

## 2021-06-17 DIAGNOSIS — I69320 Aphasia following cerebral infarction: Secondary | ICD-10-CM | POA: Diagnosis not present

## 2021-06-17 DIAGNOSIS — I69321 Dysphasia following cerebral infarction: Secondary | ICD-10-CM | POA: Diagnosis not present

## 2021-06-18 DIAGNOSIS — I69351 Hemiplegia and hemiparesis following cerebral infarction affecting right dominant side: Secondary | ICD-10-CM | POA: Diagnosis not present

## 2021-06-18 DIAGNOSIS — I63412 Cerebral infarction due to embolism of left middle cerebral artery: Secondary | ICD-10-CM | POA: Diagnosis not present

## 2021-06-18 DIAGNOSIS — I5042 Chronic combined systolic (congestive) and diastolic (congestive) heart failure: Secondary | ICD-10-CM | POA: Diagnosis not present

## 2021-06-18 DIAGNOSIS — N179 Acute kidney failure, unspecified: Secondary | ICD-10-CM | POA: Diagnosis not present

## 2021-06-18 DIAGNOSIS — I513 Intracardiac thrombosis, not elsewhere classified: Secondary | ICD-10-CM | POA: Diagnosis not present

## 2021-06-18 DIAGNOSIS — M6281 Muscle weakness (generalized): Secondary | ICD-10-CM | POA: Diagnosis not present

## 2021-06-18 DIAGNOSIS — I69320 Aphasia following cerebral infarction: Secondary | ICD-10-CM | POA: Diagnosis not present

## 2021-06-18 DIAGNOSIS — I63512 Cerebral infarction due to unspecified occlusion or stenosis of left middle cerebral artery: Secondary | ICD-10-CM | POA: Diagnosis not present

## 2021-06-18 DIAGNOSIS — I69321 Dysphasia following cerebral infarction: Secondary | ICD-10-CM | POA: Diagnosis not present

## 2021-06-19 DIAGNOSIS — I69351 Hemiplegia and hemiparesis following cerebral infarction affecting right dominant side: Secondary | ICD-10-CM | POA: Diagnosis not present

## 2021-06-19 DIAGNOSIS — I69321 Dysphasia following cerebral infarction: Secondary | ICD-10-CM | POA: Diagnosis not present

## 2021-06-19 DIAGNOSIS — N179 Acute kidney failure, unspecified: Secondary | ICD-10-CM | POA: Diagnosis not present

## 2021-06-19 DIAGNOSIS — I63412 Cerebral infarction due to embolism of left middle cerebral artery: Secondary | ICD-10-CM | POA: Diagnosis not present

## 2021-06-19 DIAGNOSIS — I513 Intracardiac thrombosis, not elsewhere classified: Secondary | ICD-10-CM | POA: Diagnosis not present

## 2021-06-19 DIAGNOSIS — I5042 Chronic combined systolic (congestive) and diastolic (congestive) heart failure: Secondary | ICD-10-CM | POA: Diagnosis not present

## 2021-06-19 DIAGNOSIS — I69320 Aphasia following cerebral infarction: Secondary | ICD-10-CM | POA: Diagnosis not present

## 2021-06-19 DIAGNOSIS — I63512 Cerebral infarction due to unspecified occlusion or stenosis of left middle cerebral artery: Secondary | ICD-10-CM | POA: Diagnosis not present

## 2021-06-19 DIAGNOSIS — M6281 Muscle weakness (generalized): Secondary | ICD-10-CM | POA: Diagnosis not present

## 2021-06-20 DIAGNOSIS — I513 Intracardiac thrombosis, not elsewhere classified: Secondary | ICD-10-CM | POA: Diagnosis not present

## 2021-06-20 DIAGNOSIS — I63512 Cerebral infarction due to unspecified occlusion or stenosis of left middle cerebral artery: Secondary | ICD-10-CM | POA: Diagnosis not present

## 2021-06-20 DIAGNOSIS — N179 Acute kidney failure, unspecified: Secondary | ICD-10-CM | POA: Diagnosis not present

## 2021-06-20 DIAGNOSIS — I69321 Dysphasia following cerebral infarction: Secondary | ICD-10-CM | POA: Diagnosis not present

## 2021-06-20 DIAGNOSIS — I5042 Chronic combined systolic (congestive) and diastolic (congestive) heart failure: Secondary | ICD-10-CM | POA: Diagnosis not present

## 2021-06-20 DIAGNOSIS — I69351 Hemiplegia and hemiparesis following cerebral infarction affecting right dominant side: Secondary | ICD-10-CM | POA: Diagnosis not present

## 2021-06-20 DIAGNOSIS — M6281 Muscle weakness (generalized): Secondary | ICD-10-CM | POA: Diagnosis not present

## 2021-06-20 DIAGNOSIS — I69320 Aphasia following cerebral infarction: Secondary | ICD-10-CM | POA: Diagnosis not present

## 2021-06-20 DIAGNOSIS — I63412 Cerebral infarction due to embolism of left middle cerebral artery: Secondary | ICD-10-CM | POA: Diagnosis not present

## 2021-06-21 DIAGNOSIS — I63512 Cerebral infarction due to unspecified occlusion or stenosis of left middle cerebral artery: Secondary | ICD-10-CM | POA: Diagnosis not present

## 2021-06-21 DIAGNOSIS — I69351 Hemiplegia and hemiparesis following cerebral infarction affecting right dominant side: Secondary | ICD-10-CM | POA: Diagnosis not present

## 2021-06-21 DIAGNOSIS — M6281 Muscle weakness (generalized): Secondary | ICD-10-CM | POA: Diagnosis not present

## 2021-06-21 DIAGNOSIS — I69321 Dysphasia following cerebral infarction: Secondary | ICD-10-CM | POA: Diagnosis not present

## 2021-06-21 DIAGNOSIS — I513 Intracardiac thrombosis, not elsewhere classified: Secondary | ICD-10-CM | POA: Diagnosis not present

## 2021-06-21 DIAGNOSIS — N179 Acute kidney failure, unspecified: Secondary | ICD-10-CM | POA: Diagnosis not present

## 2021-06-21 DIAGNOSIS — I69320 Aphasia following cerebral infarction: Secondary | ICD-10-CM | POA: Diagnosis not present

## 2021-06-21 DIAGNOSIS — I5042 Chronic combined systolic (congestive) and diastolic (congestive) heart failure: Secondary | ICD-10-CM | POA: Diagnosis not present

## 2021-06-21 DIAGNOSIS — I63412 Cerebral infarction due to embolism of left middle cerebral artery: Secondary | ICD-10-CM | POA: Diagnosis not present

## 2021-06-23 DIAGNOSIS — M6281 Muscle weakness (generalized): Secondary | ICD-10-CM | POA: Diagnosis not present

## 2021-06-23 DIAGNOSIS — I69351 Hemiplegia and hemiparesis following cerebral infarction affecting right dominant side: Secondary | ICD-10-CM | POA: Diagnosis not present

## 2021-06-23 DIAGNOSIS — I63412 Cerebral infarction due to embolism of left middle cerebral artery: Secondary | ICD-10-CM | POA: Diagnosis not present

## 2021-06-23 DIAGNOSIS — I69321 Dysphasia following cerebral infarction: Secondary | ICD-10-CM | POA: Diagnosis not present

## 2021-06-23 DIAGNOSIS — I63512 Cerebral infarction due to unspecified occlusion or stenosis of left middle cerebral artery: Secondary | ICD-10-CM | POA: Diagnosis not present

## 2021-06-23 DIAGNOSIS — I5042 Chronic combined systolic (congestive) and diastolic (congestive) heart failure: Secondary | ICD-10-CM | POA: Diagnosis not present

## 2021-06-23 DIAGNOSIS — I513 Intracardiac thrombosis, not elsewhere classified: Secondary | ICD-10-CM | POA: Diagnosis not present

## 2021-06-23 DIAGNOSIS — N179 Acute kidney failure, unspecified: Secondary | ICD-10-CM | POA: Diagnosis not present

## 2021-06-23 DIAGNOSIS — I69320 Aphasia following cerebral infarction: Secondary | ICD-10-CM | POA: Diagnosis not present

## 2021-06-24 DIAGNOSIS — N179 Acute kidney failure, unspecified: Secondary | ICD-10-CM | POA: Diagnosis not present

## 2021-06-24 DIAGNOSIS — I63412 Cerebral infarction due to embolism of left middle cerebral artery: Secondary | ICD-10-CM | POA: Diagnosis not present

## 2021-06-24 DIAGNOSIS — M6281 Muscle weakness (generalized): Secondary | ICD-10-CM | POA: Diagnosis not present

## 2021-06-24 DIAGNOSIS — I69320 Aphasia following cerebral infarction: Secondary | ICD-10-CM | POA: Diagnosis not present

## 2021-06-24 DIAGNOSIS — I69321 Dysphasia following cerebral infarction: Secondary | ICD-10-CM | POA: Diagnosis not present

## 2021-06-24 DIAGNOSIS — I69351 Hemiplegia and hemiparesis following cerebral infarction affecting right dominant side: Secondary | ICD-10-CM | POA: Diagnosis not present

## 2021-06-24 DIAGNOSIS — I5042 Chronic combined systolic (congestive) and diastolic (congestive) heart failure: Secondary | ICD-10-CM | POA: Diagnosis not present

## 2021-06-24 DIAGNOSIS — I513 Intracardiac thrombosis, not elsewhere classified: Secondary | ICD-10-CM | POA: Diagnosis not present

## 2021-06-24 DIAGNOSIS — I63512 Cerebral infarction due to unspecified occlusion or stenosis of left middle cerebral artery: Secondary | ICD-10-CM | POA: Diagnosis not present

## 2021-06-27 DIAGNOSIS — N179 Acute kidney failure, unspecified: Secondary | ICD-10-CM | POA: Diagnosis not present

## 2021-06-27 DIAGNOSIS — I63412 Cerebral infarction due to embolism of left middle cerebral artery: Secondary | ICD-10-CM | POA: Diagnosis not present

## 2021-06-27 DIAGNOSIS — I63512 Cerebral infarction due to unspecified occlusion or stenosis of left middle cerebral artery: Secondary | ICD-10-CM | POA: Diagnosis not present

## 2021-06-27 DIAGNOSIS — I513 Intracardiac thrombosis, not elsewhere classified: Secondary | ICD-10-CM | POA: Diagnosis not present

## 2021-06-27 DIAGNOSIS — M6281 Muscle weakness (generalized): Secondary | ICD-10-CM | POA: Diagnosis not present

## 2021-06-27 DIAGNOSIS — I69320 Aphasia following cerebral infarction: Secondary | ICD-10-CM | POA: Diagnosis not present

## 2021-06-27 DIAGNOSIS — I69351 Hemiplegia and hemiparesis following cerebral infarction affecting right dominant side: Secondary | ICD-10-CM | POA: Diagnosis not present

## 2021-06-27 DIAGNOSIS — I5042 Chronic combined systolic (congestive) and diastolic (congestive) heart failure: Secondary | ICD-10-CM | POA: Diagnosis not present

## 2021-06-27 DIAGNOSIS — I69321 Dysphasia following cerebral infarction: Secondary | ICD-10-CM | POA: Diagnosis not present

## 2021-06-28 DIAGNOSIS — I513 Intracardiac thrombosis, not elsewhere classified: Secondary | ICD-10-CM | POA: Diagnosis not present

## 2021-06-28 DIAGNOSIS — I69321 Dysphasia following cerebral infarction: Secondary | ICD-10-CM | POA: Diagnosis not present

## 2021-06-28 DIAGNOSIS — I5042 Chronic combined systolic (congestive) and diastolic (congestive) heart failure: Secondary | ICD-10-CM | POA: Diagnosis not present

## 2021-06-28 DIAGNOSIS — I69351 Hemiplegia and hemiparesis following cerebral infarction affecting right dominant side: Secondary | ICD-10-CM | POA: Diagnosis not present

## 2021-06-28 DIAGNOSIS — I63412 Cerebral infarction due to embolism of left middle cerebral artery: Secondary | ICD-10-CM | POA: Diagnosis not present

## 2021-06-28 DIAGNOSIS — N179 Acute kidney failure, unspecified: Secondary | ICD-10-CM | POA: Diagnosis not present

## 2021-06-28 DIAGNOSIS — I69320 Aphasia following cerebral infarction: Secondary | ICD-10-CM | POA: Diagnosis not present

## 2021-06-28 DIAGNOSIS — I63512 Cerebral infarction due to unspecified occlusion or stenosis of left middle cerebral artery: Secondary | ICD-10-CM | POA: Diagnosis not present

## 2021-06-28 DIAGNOSIS — M6281 Muscle weakness (generalized): Secondary | ICD-10-CM | POA: Diagnosis not present

## 2021-06-30 ENCOUNTER — Other Ambulatory Visit: Payer: Self-pay

## 2021-06-30 ENCOUNTER — Emergency Department (HOSPITAL_COMMUNITY)
Admission: EM | Admit: 2021-06-30 | Discharge: 2021-06-30 | Disposition: A | Discharge status: Home / Self Care | Payer: No Typology Code available for payment source | Attending: Emergency Medicine | Admitting: Emergency Medicine

## 2021-06-30 ENCOUNTER — Encounter (HOSPITAL_COMMUNITY): Payer: Self-pay | Admitting: Emergency Medicine

## 2021-06-30 ENCOUNTER — Emergency Department (HOSPITAL_COMMUNITY): Payer: No Typology Code available for payment source

## 2021-06-30 DIAGNOSIS — Z7401 Bed confinement status: Secondary | ICD-10-CM | POA: Diagnosis not present

## 2021-06-30 DIAGNOSIS — R69 Illness, unspecified: Secondary | ICD-10-CM | POA: Diagnosis not present

## 2021-06-30 DIAGNOSIS — R5383 Other fatigue: Secondary | ICD-10-CM | POA: Diagnosis not present

## 2021-06-30 DIAGNOSIS — Z7951 Long term (current) use of inhaled steroids: Secondary | ICD-10-CM | POA: Diagnosis not present

## 2021-06-30 DIAGNOSIS — Z8616 Personal history of COVID-19: Secondary | ICD-10-CM | POA: Diagnosis not present

## 2021-06-30 DIAGNOSIS — I5042 Chronic combined systolic (congestive) and diastolic (congestive) heart failure: Secondary | ICD-10-CM | POA: Diagnosis not present

## 2021-06-30 DIAGNOSIS — W050XXA Fall from non-moving wheelchair, initial encounter: Secondary | ICD-10-CM | POA: Diagnosis not present

## 2021-06-30 DIAGNOSIS — Z7901 Long term (current) use of anticoagulants: Secondary | ICD-10-CM | POA: Diagnosis not present

## 2021-06-30 DIAGNOSIS — R410 Disorientation, unspecified: Secondary | ICD-10-CM | POA: Insufficient documentation

## 2021-06-30 DIAGNOSIS — N1832 Chronic kidney disease, stage 3b: Secondary | ICD-10-CM | POA: Diagnosis not present

## 2021-06-30 DIAGNOSIS — Y92129 Unspecified place in nursing home as the place of occurrence of the external cause: Secondary | ICD-10-CM | POA: Insufficient documentation

## 2021-06-30 DIAGNOSIS — Z79899 Other long term (current) drug therapy: Secondary | ICD-10-CM | POA: Insufficient documentation

## 2021-06-30 DIAGNOSIS — W19XXXA Unspecified fall, initial encounter: Secondary | ICD-10-CM | POA: Diagnosis not present

## 2021-06-30 DIAGNOSIS — Z87891 Personal history of nicotine dependence: Secondary | ICD-10-CM | POA: Diagnosis not present

## 2021-06-30 DIAGNOSIS — I130 Hypertensive heart and chronic kidney disease with heart failure and stage 1 through stage 4 chronic kidney disease, or unspecified chronic kidney disease: Secondary | ICD-10-CM | POA: Insufficient documentation

## 2021-06-30 DIAGNOSIS — J449 Chronic obstructive pulmonary disease, unspecified: Secondary | ICD-10-CM | POA: Diagnosis not present

## 2021-06-30 DIAGNOSIS — R4182 Altered mental status, unspecified: Secondary | ICD-10-CM | POA: Diagnosis not present

## 2021-06-30 DIAGNOSIS — Z8541 Personal history of malignant neoplasm of cervix uteri: Secondary | ICD-10-CM | POA: Diagnosis not present

## 2021-06-30 DIAGNOSIS — R5381 Other malaise: Secondary | ICD-10-CM | POA: Diagnosis not present

## 2021-06-30 NOTE — ED Notes (Signed)
Report called to [place] RN

## 2021-06-30 NOTE — ED Triage Notes (Addendum)
Patient coming from [facility]. Complaint of fall out of wheelchair. Patient endorses no complaints, denies hitting her head. Facility wanted patient sent out d/t fall being unwitnessed. VSS. NAD. Right sided deficits @ baseline.

## 2021-06-30 NOTE — ED Notes (Signed)
[facility] called  

## 2021-06-30 NOTE — ED Provider Notes (Addendum)
[facility] EMERGENCY DEPARTMENT Provider Note   CSN: [id number] Arrival date & time: 06/30/21  1356     History Chief Complaint  Patient presents with   Mindy Mills    Mindy Mills is a 70 y.o. female.  The history is provided by the patient, the EMS personnel and medical records.  Fall Mindy Mills is a 70 y.o. female who presents to the Emergency Department complaining of fall.  Level V caveat due to confusion.  She presents to the ED for evaluation following an unwitnessed fall. She is in a wheelchair at baseline. She is a resident at [facility] facility. She is on eliquis. Attempted to contact facility for additional information and there was no answer. Contacted patient daughter-in-law, who states that she falls frequently. No reports of recent illnesses. Patient has no complaints on ED presentation.     Past Medical History:  Diagnosis Date   Cancer ([place])    Cervicle   Full dentures    Hepatitis C    denies   Hypertension    Migraine    Myocardial infarction [facility])    Stroke ([place]) 2015   Pt reports hx of CVA x3.  Last one in 2015   Wears glasses    Wears hearing aid in both ears     Patient Active Problem List   Diagnosis Date Noted   Pneumonia 02/07/2021   Pressure injury of skin 02/07/2021   SOB (shortness of breath) 02/06/2021   Acute renal failure superimposed on stage 3b chronic kidney disease ([place]) 01/28/2021   Chronic combined systolic and diastolic CHF (congestive heart failure) ([place]) 01/28/2021   SIRS (systemic inflammatory response syndrome) ([place]) 01/28/2021   Mural thrombus of cardiac apex 01/28/2021   COVID-19 virus infection 01/28/2021   Hyperkalemia 01/28/2021   Chronic respiratory failure with hypoxia (HCC)    Cardioembolic stroke ([place]) 41/93/7902   Protein-calorie malnutrition, severe 12/12/2020   Pneumonia of right lower lobe due to infectious organism    Acute systolic CHF (congestive heart failure) ([place])  12/09/2020   Emphysema/COPD ([place]) 12/09/2020   Aspiration pneumonia ([place]) 12/07/2020   Elevated troponin 12/07/2020   Closed fracture of right distal radius 09/07/2018   Fall 09/08/2015   UTI (lower urinary tract infection) 09/08/2015   Dehydration 09/08/2015   Sepsis ([place]) 40/97/3532   Systolic heart failure ([place]) 09/08/2015   Confusion 09/08/2015   TIA (transient ischemic attack) 09/08/2015   Acute on chronic respiratory failure ([place]) 09/24/2014   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   Demand ischemia of myocardium: in setting of CVA 09/17/2014   Cardiomyopathy, idiopathic - in setting of Rosa Sanchez, LAD WMA on Echo & demand ischemia-infarction 09/17/2014   Subarachnoid hemorrhage due to ruptured aneurysm ([place]) 09/11/2014   Acute respiratory failure ([place]) 09/11/2014   Hypokalemia 09/11/2014   Altered mental status 09/11/2014   Subarachnoid hemorrhage ([place]) 09/11/2014    Past Surgical History:  Procedure Laterality Date   ABDOMINAL HYSTERECTOMY     BUNIONECTOMY     COLONOSCOPY     MULTIPLE TOOTH EXTRACTIONS     OPEN REDUCTION INTERNAL FIXATION (ORIF) DISTAL RADIAL FRACTURE Right 09/07/2018   Procedure: OPEN REDUCTION INTERNAL FIXATION (ORIF) DISTAL RADIAL FRACTURE;  Surgeon: Iran Planas, MD;  Location: [facility];  Service: Orthopedics;  Laterality: Right;   PEG PLACEMENT N/A 09/21/2014   Procedure: PERCUTANEOUS ENDOSCOPIC GASTROSTOMY (PEG) PLACEMENT (BED SIDE);  Surgeon: Georganna Skeans, MD;  Location: [facility];  Service: General;  Laterality: N/A;   RADIOLOGY WITH  ANESTHESIA N/A 09/11/2014   Procedure: Sub Arachnoid Aneurysm Coiling -RADIOLOGY WITH ANESTHESIA ;  Surgeon: Consuella Lose, MD;  Location: [place];  Service: Radiology;  Laterality: N/A;     OB History   No obstetric history on file.     Family History  Problem Relation Age of Onset   Alcohol abuse Father     Social History   Tobacco Use   Smoking status: Former    Packs/day: 0.50    Pack years: 0.00     Types: Cigarettes    Quit date: 09/06/2015    Years since quitting: 5.8   Smokeless tobacco: Never  Vaping Use   Vaping Use: Former  Substance Use Topics   Alcohol use: Not Currently   Drug use: No    Home Medications Prior to Admission medications   Medication Sig Start Date End Date Taking? Authorizing Provider  acetaminophen (TYLENOL) 325 MG tablet Take 2 tablets (650 mg total) by mouth every 6 (six) hours as needed for mild pain (or Fever >/= 101). 02/11/21   Elgergawy, Silver Huguenin, MD  albuterol (PROVENTIL) (2.5 MG/3ML) 0.083% nebulizer solution Take 3 mLs (2.5 mg total) by nebulization every 4 (four) hours as needed for wheezing or shortness of breath. 12/24/20   Pahwani, Michell Heinrich, MD  arformoterol (BROVANA) 15 MCG/2ML NEBU Take 2 mLs (15 mcg total) by nebulization 2 (two) times daily. 12/24/20   Pahwani, Michell Heinrich, MD  budesonide (PULMICORT) 0.5 MG/2ML nebulizer solution Take 2 mLs (0.5 mg total) by nebulization 2 (two) times daily. 12/24/20   Pahwani, Michell Heinrich, MD  Cholecalciferol (VITAMIN D) 50 MCG (2000 UT) CAPS Take 2,000 Units by mouth daily.    [provider]  clonazePAM (KLONOPIN) 0.5 MG tablet Take 1 tablet (0.5 mg total) by mouth 2 (two) times daily. 02/11/21   Elgergawy, Silver Huguenin, MD  docusate sodium (COLACE) 100 MG capsule Take 1 capsule (100 mg total) by mouth 2 (two) times daily. 09/09/18   Brynda Peon, PA  EPINEPHrine 0.3 mg/0.3 mL IJ SOAJ injection Inject 0.3 mg into the muscle once as needed (for allergic reaction).    [provider]  feeding supplement, ENSURE ENLIVE, (ENSURE ENLIVE) LIQD Take 237 mLs by mouth daily. 09/11/15   Ghimire, Henreitta Leber, MD  FLUoxetine (PROZAC) 20 MG capsule Take 80 mg by mouth daily.    [provider]  guaiFENesin (MUCINEX) 600 MG 12 hr tablet Take 1 tablet (600 mg total) by mouth 2 (two) times daily. 12/24/20   Pahwani, Michell Heinrich, MD  metoprolol succinate (TOPROL-XL) 25 MG 24 hr tablet Take 0.5 tablets (12.5 mg  total) by mouth daily. 12/25/20   Pahwani, Michell Heinrich, MD  pantoprazole (PROTONIX) 40 MG tablet Take 1 tablet (40 mg total) by mouth at bedtime. 09/11/15   Ghimire, Henreitta Leber, MD  rosuvastatin (CRESTOR) 40 MG tablet Take 20 mg by mouth daily.    [provider]  traZODone (DESYREL) 100 MG tablet Take 100 mg by mouth at bedtime.    [provider]  vitamin C (ASCORBIC ACID) 500 MG tablet Take 500 mg by mouth in the morning and at bedtime.    [provider]  warfarin (COUMADIN) 5 MG tablet Take 0.5 tablets (2.5 mg total) by mouth daily at 4 PM. Skip today, and start first dose on 02/12/2021 02/12/21   Elgergawy, Silver Huguenin, MD    Allergies    Bee venom, Capsicum (cayenne) [cayenne], Morphine, and Topiramate  Review of Systems  Review of Systems  All other systems reviewed and are negative.  Physical Exam Updated Vital Signs BP 123/81 (BP Location: Right Arm)   Pulse 62   Temp 97.7 F (36.5 C) (Oral)   Resp 14   SpO2 100%   Physical Exam Vitals and nursing note reviewed.  Constitutional:      Appearance: She is well-developed.  HENT:     Head: Normocephalic and atraumatic.  Cardiovascular:     Rate and Rhythm: Normal rate and regular rhythm.     Heart sounds: No murmur heard. Pulmonary:     Effort: Pulmonary effort is normal. No respiratory distress.     Breath sounds: Normal breath sounds.  Abdominal:     Palpations: Abdomen is soft.     Tenderness: There is no abdominal tenderness. There is no guarding or rebound.  Musculoskeletal:        General: No tenderness.  Skin:    General: Skin is warm and dry.  Neurological:     Mental Status: She is alert.     Comments: Minimally verbal. Answers yes and no questions. Right hemiparesis.  Psychiatric:        Behavior: Behavior normal.    ED Results / Procedures / Treatments   Labs (all labs ordered are listed, but only abnormal results are displayed) Labs Reviewed - No data to  display  EKG None  Radiology CT Head Wo Contrast  Result Date: 06/30/2021 CLINICAL DATA:  Head trauma fall EXAM: CT HEAD WITHOUT CONTRAST TECHNIQUE: Contiguous axial images were obtained from the base of the skull through the vertex without intravenous contrast. COMPARISON:  CT brain 01/30/2021 FINDINGS: Brain: No acute territorial infarction, hemorrhage or intracranial mass. Remote left MCA infarct with encephalomalacia as before. Small chronic infarct at the right frontal parietal junction. Atrophy and chronic small vessel ischemic changes of the white matter. Stable ventricle size and configuration. Vascular: No hyperdense vessels. Carotid vascular calcification. Aneurysm coils. Skull: Negative for fracture Sinuses/Orbits: No acute finding. Other: None IMPRESSION: 1. No CT evidence for acute intracranial abnormality 2. Chronic left MCA infarct. Atrophy and chronic small vessel ischemic changes of the white matter Electronically Signed   By: Donavan Foil M.D.   On: 06/30/2021 15:42    Procedures Procedures   Medications Ordered in ED Medications - No data to display  ED Course  I have reviewed the triage vital signs and the nursing notes.  Pertinent labs & imaging results that were available during my care of the patient were reviewed by me and considered in my medical decision making (see chart for details).    MDM Rules/Calculators/A&P                         patient here for evaluation following an unwitnessed fall. No evidence of trauma on examination. She has no tenderness to palpation over long bones. Given her anticoagulation it will check a CT head in the event she did strike her head.  CT is negative for acute abnormality. Plan to discharge to facility with outpatient follow-up and return precautions Final Clinical Impression(s) / ED Diagnoses Final diagnoses:  Fall, initial encounter    Rx / DC Orders ED Discharge Orders     None        Quintella Reichert, MD 06/30/21  1531    Quintella Reichert, MD 06/30/21 [id number]

## 2021-07-01 DIAGNOSIS — I5042 Chronic combined systolic (congestive) and diastolic (congestive) heart failure: Secondary | ICD-10-CM | POA: Diagnosis not present

## 2021-07-01 DIAGNOSIS — I69321 Dysphasia following cerebral infarction: Secondary | ICD-10-CM | POA: Diagnosis not present

## 2021-07-01 DIAGNOSIS — I513 Intracardiac thrombosis, not elsewhere classified: Secondary | ICD-10-CM | POA: Diagnosis not present

## 2021-07-01 DIAGNOSIS — I63412 Cerebral infarction due to embolism of left middle cerebral artery: Secondary | ICD-10-CM | POA: Diagnosis not present

## 2021-07-01 DIAGNOSIS — I69320 Aphasia following cerebral infarction: Secondary | ICD-10-CM | POA: Diagnosis not present

## 2021-07-01 DIAGNOSIS — I63512 Cerebral infarction due to unspecified occlusion or stenosis of left middle cerebral artery: Secondary | ICD-10-CM | POA: Diagnosis not present

## 2021-07-01 DIAGNOSIS — I69351 Hemiplegia and hemiparesis following cerebral infarction affecting right dominant side: Secondary | ICD-10-CM | POA: Diagnosis not present

## 2021-07-01 DIAGNOSIS — N179 Acute kidney failure, unspecified: Secondary | ICD-10-CM | POA: Diagnosis not present

## 2021-07-01 DIAGNOSIS — M6281 Muscle weakness (generalized): Secondary | ICD-10-CM | POA: Diagnosis not present

## 2021-07-02 DIAGNOSIS — I5042 Chronic combined systolic (congestive) and diastolic (congestive) heart failure: Secondary | ICD-10-CM | POA: Diagnosis not present

## 2021-07-02 DIAGNOSIS — N179 Acute kidney failure, unspecified: Secondary | ICD-10-CM | POA: Diagnosis not present

## 2021-07-02 DIAGNOSIS — I69320 Aphasia following cerebral infarction: Secondary | ICD-10-CM | POA: Diagnosis not present

## 2021-07-02 DIAGNOSIS — I63512 Cerebral infarction due to unspecified occlusion or stenosis of left middle cerebral artery: Secondary | ICD-10-CM | POA: Diagnosis not present

## 2021-07-02 DIAGNOSIS — I69321 Dysphasia following cerebral infarction: Secondary | ICD-10-CM | POA: Diagnosis not present

## 2021-07-02 DIAGNOSIS — I513 Intracardiac thrombosis, not elsewhere classified: Secondary | ICD-10-CM | POA: Diagnosis not present

## 2021-07-02 DIAGNOSIS — I69351 Hemiplegia and hemiparesis following cerebral infarction affecting right dominant side: Secondary | ICD-10-CM | POA: Diagnosis not present

## 2021-07-02 DIAGNOSIS — M6281 Muscle weakness (generalized): Secondary | ICD-10-CM | POA: Diagnosis not present

## 2021-07-02 DIAGNOSIS — I63412 Cerebral infarction due to embolism of left middle cerebral artery: Secondary | ICD-10-CM | POA: Diagnosis not present

## 2021-07-03 DIAGNOSIS — I513 Intracardiac thrombosis, not elsewhere classified: Secondary | ICD-10-CM | POA: Diagnosis not present

## 2021-07-03 DIAGNOSIS — M6281 Muscle weakness (generalized): Secondary | ICD-10-CM | POA: Diagnosis not present

## 2021-07-03 DIAGNOSIS — I69321 Dysphasia following cerebral infarction: Secondary | ICD-10-CM | POA: Diagnosis not present

## 2021-07-03 DIAGNOSIS — I5042 Chronic combined systolic (congestive) and diastolic (congestive) heart failure: Secondary | ICD-10-CM | POA: Diagnosis not present

## 2021-07-03 DIAGNOSIS — I69320 Aphasia following cerebral infarction: Secondary | ICD-10-CM | POA: Diagnosis not present

## 2021-07-03 DIAGNOSIS — I63512 Cerebral infarction due to unspecified occlusion or stenosis of left middle cerebral artery: Secondary | ICD-10-CM | POA: Diagnosis not present

## 2021-07-03 DIAGNOSIS — I63412 Cerebral infarction due to embolism of left middle cerebral artery: Secondary | ICD-10-CM | POA: Diagnosis not present

## 2021-07-03 DIAGNOSIS — N179 Acute kidney failure, unspecified: Secondary | ICD-10-CM | POA: Diagnosis not present

## 2021-07-03 DIAGNOSIS — I69351 Hemiplegia and hemiparesis following cerebral infarction affecting right dominant side: Secondary | ICD-10-CM | POA: Diagnosis not present

## 2021-07-04 DIAGNOSIS — I69320 Aphasia following cerebral infarction: Secondary | ICD-10-CM | POA: Diagnosis not present

## 2021-07-04 DIAGNOSIS — N179 Acute kidney failure, unspecified: Secondary | ICD-10-CM | POA: Diagnosis not present

## 2021-07-04 DIAGNOSIS — I63512 Cerebral infarction due to unspecified occlusion or stenosis of left middle cerebral artery: Secondary | ICD-10-CM | POA: Diagnosis not present

## 2021-07-04 DIAGNOSIS — M6281 Muscle weakness (generalized): Secondary | ICD-10-CM | POA: Diagnosis not present

## 2021-07-04 DIAGNOSIS — I69351 Hemiplegia and hemiparesis following cerebral infarction affecting right dominant side: Secondary | ICD-10-CM | POA: Diagnosis not present

## 2021-07-04 DIAGNOSIS — I63412 Cerebral infarction due to embolism of left middle cerebral artery: Secondary | ICD-10-CM | POA: Diagnosis not present

## 2021-07-04 DIAGNOSIS — I69321 Dysphasia following cerebral infarction: Secondary | ICD-10-CM | POA: Diagnosis not present

## 2021-07-04 DIAGNOSIS — I5042 Chronic combined systolic (congestive) and diastolic (congestive) heart failure: Secondary | ICD-10-CM | POA: Diagnosis not present

## 2021-07-04 DIAGNOSIS — I513 Intracardiac thrombosis, not elsewhere classified: Secondary | ICD-10-CM | POA: Diagnosis not present

## 2021-07-05 DIAGNOSIS — I69351 Hemiplegia and hemiparesis following cerebral infarction affecting right dominant side: Secondary | ICD-10-CM | POA: Diagnosis not present

## 2021-07-05 DIAGNOSIS — I63512 Cerebral infarction due to unspecified occlusion or stenosis of left middle cerebral artery: Secondary | ICD-10-CM | POA: Diagnosis not present

## 2021-07-05 DIAGNOSIS — I69320 Aphasia following cerebral infarction: Secondary | ICD-10-CM | POA: Diagnosis not present

## 2021-07-05 DIAGNOSIS — M6281 Muscle weakness (generalized): Secondary | ICD-10-CM | POA: Diagnosis not present

## 2021-07-05 DIAGNOSIS — I5042 Chronic combined systolic (congestive) and diastolic (congestive) heart failure: Secondary | ICD-10-CM | POA: Diagnosis not present

## 2021-07-05 DIAGNOSIS — I69321 Dysphasia following cerebral infarction: Secondary | ICD-10-CM | POA: Diagnosis not present

## 2021-07-05 DIAGNOSIS — I513 Intracardiac thrombosis, not elsewhere classified: Secondary | ICD-10-CM | POA: Diagnosis not present

## 2021-07-05 DIAGNOSIS — I63412 Cerebral infarction due to embolism of left middle cerebral artery: Secondary | ICD-10-CM | POA: Diagnosis not present

## 2021-07-05 DIAGNOSIS — N179 Acute kidney failure, unspecified: Secondary | ICD-10-CM | POA: Diagnosis not present

## 2021-07-07 DIAGNOSIS — I69321 Dysphasia following cerebral infarction: Secondary | ICD-10-CM | POA: Diagnosis not present

## 2021-07-07 DIAGNOSIS — I513 Intracardiac thrombosis, not elsewhere classified: Secondary | ICD-10-CM | POA: Diagnosis not present

## 2021-07-07 DIAGNOSIS — I5042 Chronic combined systolic (congestive) and diastolic (congestive) heart failure: Secondary | ICD-10-CM | POA: Diagnosis not present

## 2021-07-07 DIAGNOSIS — I69320 Aphasia following cerebral infarction: Secondary | ICD-10-CM | POA: Diagnosis not present

## 2021-07-07 DIAGNOSIS — I63412 Cerebral infarction due to embolism of left middle cerebral artery: Secondary | ICD-10-CM | POA: Diagnosis not present

## 2021-07-07 DIAGNOSIS — N179 Acute kidney failure, unspecified: Secondary | ICD-10-CM | POA: Diagnosis not present

## 2021-07-07 DIAGNOSIS — I69351 Hemiplegia and hemiparesis following cerebral infarction affecting right dominant side: Secondary | ICD-10-CM | POA: Diagnosis not present

## 2021-07-07 DIAGNOSIS — I63512 Cerebral infarction due to unspecified occlusion or stenosis of left middle cerebral artery: Secondary | ICD-10-CM | POA: Diagnosis not present

## 2021-07-07 DIAGNOSIS — M6281 Muscle weakness (generalized): Secondary | ICD-10-CM | POA: Diagnosis not present

## 2021-07-08 DIAGNOSIS — I69351 Hemiplegia and hemiparesis following cerebral infarction affecting right dominant side: Secondary | ICD-10-CM | POA: Diagnosis not present

## 2021-07-08 DIAGNOSIS — I69321 Dysphasia following cerebral infarction: Secondary | ICD-10-CM | POA: Diagnosis not present

## 2021-07-08 DIAGNOSIS — I10 Essential (primary) hypertension: Secondary | ICD-10-CM | POA: Diagnosis not present

## 2021-07-08 DIAGNOSIS — I63412 Cerebral infarction due to embolism of left middle cerebral artery: Secondary | ICD-10-CM | POA: Diagnosis not present

## 2021-07-08 DIAGNOSIS — I513 Intracardiac thrombosis, not elsewhere classified: Secondary | ICD-10-CM | POA: Diagnosis not present

## 2021-07-08 DIAGNOSIS — N179 Acute kidney failure, unspecified: Secondary | ICD-10-CM | POA: Diagnosis not present

## 2021-07-08 DIAGNOSIS — M6281 Muscle weakness (generalized): Secondary | ICD-10-CM | POA: Diagnosis not present

## 2021-07-08 DIAGNOSIS — I5042 Chronic combined systolic (congestive) and diastolic (congestive) heart failure: Secondary | ICD-10-CM | POA: Diagnosis not present

## 2021-07-08 DIAGNOSIS — I69320 Aphasia following cerebral infarction: Secondary | ICD-10-CM | POA: Diagnosis not present

## 2021-07-08 DIAGNOSIS — I63512 Cerebral infarction due to unspecified occlusion or stenosis of left middle cerebral artery: Secondary | ICD-10-CM | POA: Diagnosis not present

## 2021-07-08 DIAGNOSIS — K5900 Constipation, unspecified: Secondary | ICD-10-CM | POA: Diagnosis not present

## 2021-07-08 DIAGNOSIS — I679 Cerebrovascular disease, unspecified: Secondary | ICD-10-CM | POA: Diagnosis not present

## 2021-07-09 DIAGNOSIS — I69351 Hemiplegia and hemiparesis following cerebral infarction affecting right dominant side: Secondary | ICD-10-CM | POA: Diagnosis not present

## 2021-07-09 DIAGNOSIS — I5042 Chronic combined systolic (congestive) and diastolic (congestive) heart failure: Secondary | ICD-10-CM | POA: Diagnosis not present

## 2021-07-09 DIAGNOSIS — M6281 Muscle weakness (generalized): Secondary | ICD-10-CM | POA: Diagnosis not present

## 2021-07-09 DIAGNOSIS — I63512 Cerebral infarction due to unspecified occlusion or stenosis of left middle cerebral artery: Secondary | ICD-10-CM | POA: Diagnosis not present

## 2021-07-09 DIAGNOSIS — I69320 Aphasia following cerebral infarction: Secondary | ICD-10-CM | POA: Diagnosis not present

## 2021-07-09 DIAGNOSIS — I513 Intracardiac thrombosis, not elsewhere classified: Secondary | ICD-10-CM | POA: Diagnosis not present

## 2021-07-09 DIAGNOSIS — I63412 Cerebral infarction due to embolism of left middle cerebral artery: Secondary | ICD-10-CM | POA: Diagnosis not present

## 2021-07-09 DIAGNOSIS — N179 Acute kidney failure, unspecified: Secondary | ICD-10-CM | POA: Diagnosis not present

## 2021-07-09 DIAGNOSIS — I69321 Dysphasia following cerebral infarction: Secondary | ICD-10-CM | POA: Diagnosis not present

## 2021-07-10 DIAGNOSIS — F331 Major depressive disorder, recurrent, moderate: Secondary | ICD-10-CM | POA: Diagnosis not present

## 2021-07-10 DIAGNOSIS — I5042 Chronic combined systolic (congestive) and diastolic (congestive) heart failure: Secondary | ICD-10-CM | POA: Diagnosis not present

## 2021-07-10 DIAGNOSIS — I69320 Aphasia following cerebral infarction: Secondary | ICD-10-CM | POA: Diagnosis not present

## 2021-07-10 DIAGNOSIS — I63512 Cerebral infarction due to unspecified occlusion or stenosis of left middle cerebral artery: Secondary | ICD-10-CM | POA: Diagnosis not present

## 2021-07-10 DIAGNOSIS — I63412 Cerebral infarction due to embolism of left middle cerebral artery: Secondary | ICD-10-CM | POA: Diagnosis not present

## 2021-07-10 DIAGNOSIS — I69321 Dysphasia following cerebral infarction: Secondary | ICD-10-CM | POA: Diagnosis not present

## 2021-07-10 DIAGNOSIS — I69351 Hemiplegia and hemiparesis following cerebral infarction affecting right dominant side: Secondary | ICD-10-CM | POA: Diagnosis not present

## 2021-07-10 DIAGNOSIS — N179 Acute kidney failure, unspecified: Secondary | ICD-10-CM | POA: Diagnosis not present

## 2021-07-10 DIAGNOSIS — I513 Intracardiac thrombosis, not elsewhere classified: Secondary | ICD-10-CM | POA: Diagnosis not present

## 2021-07-10 DIAGNOSIS — M6281 Muscle weakness (generalized): Secondary | ICD-10-CM | POA: Diagnosis not present

## 2021-07-10 DIAGNOSIS — R419 Unspecified symptoms and signs involving cognitive functions and awareness: Secondary | ICD-10-CM | POA: Diagnosis not present

## 2021-07-11 DIAGNOSIS — I513 Intracardiac thrombosis, not elsewhere classified: Secondary | ICD-10-CM | POA: Diagnosis not present

## 2021-07-11 DIAGNOSIS — I69321 Dysphasia following cerebral infarction: Secondary | ICD-10-CM | POA: Diagnosis not present

## 2021-07-11 DIAGNOSIS — I63512 Cerebral infarction due to unspecified occlusion or stenosis of left middle cerebral artery: Secondary | ICD-10-CM | POA: Diagnosis not present

## 2021-07-11 DIAGNOSIS — I5042 Chronic combined systolic (congestive) and diastolic (congestive) heart failure: Secondary | ICD-10-CM | POA: Diagnosis not present

## 2021-07-11 DIAGNOSIS — M6281 Muscle weakness (generalized): Secondary | ICD-10-CM | POA: Diagnosis not present

## 2021-07-11 DIAGNOSIS — I63412 Cerebral infarction due to embolism of left middle cerebral artery: Secondary | ICD-10-CM | POA: Diagnosis not present

## 2021-07-11 DIAGNOSIS — I69351 Hemiplegia and hemiparesis following cerebral infarction affecting right dominant side: Secondary | ICD-10-CM | POA: Diagnosis not present

## 2021-07-11 DIAGNOSIS — I69320 Aphasia following cerebral infarction: Secondary | ICD-10-CM | POA: Diagnosis not present

## 2021-07-11 DIAGNOSIS — N179 Acute kidney failure, unspecified: Secondary | ICD-10-CM | POA: Diagnosis not present

## 2021-07-14 DIAGNOSIS — M6281 Muscle weakness (generalized): Secondary | ICD-10-CM | POA: Diagnosis not present

## 2021-07-14 DIAGNOSIS — I63512 Cerebral infarction due to unspecified occlusion or stenosis of left middle cerebral artery: Secondary | ICD-10-CM | POA: Diagnosis not present

## 2021-07-14 DIAGNOSIS — I69321 Dysphasia following cerebral infarction: Secondary | ICD-10-CM | POA: Diagnosis not present

## 2021-07-14 DIAGNOSIS — I69320 Aphasia following cerebral infarction: Secondary | ICD-10-CM | POA: Diagnosis not present

## 2021-07-14 DIAGNOSIS — I63412 Cerebral infarction due to embolism of left middle cerebral artery: Secondary | ICD-10-CM | POA: Diagnosis not present

## 2021-07-14 DIAGNOSIS — N179 Acute kidney failure, unspecified: Secondary | ICD-10-CM | POA: Diagnosis not present

## 2021-07-14 DIAGNOSIS — I5042 Chronic combined systolic (congestive) and diastolic (congestive) heart failure: Secondary | ICD-10-CM | POA: Diagnosis not present

## 2021-07-14 DIAGNOSIS — I69351 Hemiplegia and hemiparesis following cerebral infarction affecting right dominant side: Secondary | ICD-10-CM | POA: Diagnosis not present

## 2021-07-14 DIAGNOSIS — I513 Intracardiac thrombosis, not elsewhere classified: Secondary | ICD-10-CM | POA: Diagnosis not present

## 2021-07-15 DIAGNOSIS — M6281 Muscle weakness (generalized): Secondary | ICD-10-CM | POA: Diagnosis not present

## 2021-07-15 DIAGNOSIS — I63412 Cerebral infarction due to embolism of left middle cerebral artery: Secondary | ICD-10-CM | POA: Diagnosis not present

## 2021-07-15 DIAGNOSIS — I63512 Cerebral infarction due to unspecified occlusion or stenosis of left middle cerebral artery: Secondary | ICD-10-CM | POA: Diagnosis not present

## 2021-07-15 DIAGNOSIS — I69320 Aphasia following cerebral infarction: Secondary | ICD-10-CM | POA: Diagnosis not present

## 2021-07-15 DIAGNOSIS — I5042 Chronic combined systolic (congestive) and diastolic (congestive) heart failure: Secondary | ICD-10-CM | POA: Diagnosis not present

## 2021-07-15 DIAGNOSIS — I513 Intracardiac thrombosis, not elsewhere classified: Secondary | ICD-10-CM | POA: Diagnosis not present

## 2021-07-15 DIAGNOSIS — N179 Acute kidney failure, unspecified: Secondary | ICD-10-CM | POA: Diagnosis not present

## 2021-07-15 DIAGNOSIS — I69351 Hemiplegia and hemiparesis following cerebral infarction affecting right dominant side: Secondary | ICD-10-CM | POA: Diagnosis not present

## 2021-07-15 DIAGNOSIS — I69321 Dysphasia following cerebral infarction: Secondary | ICD-10-CM | POA: Diagnosis not present

## 2021-07-17 DIAGNOSIS — I69351 Hemiplegia and hemiparesis following cerebral infarction affecting right dominant side: Secondary | ICD-10-CM | POA: Diagnosis not present

## 2021-07-17 DIAGNOSIS — I69320 Aphasia following cerebral infarction: Secondary | ICD-10-CM | POA: Diagnosis not present

## 2021-07-17 DIAGNOSIS — I63412 Cerebral infarction due to embolism of left middle cerebral artery: Secondary | ICD-10-CM | POA: Diagnosis not present

## 2021-07-17 DIAGNOSIS — N179 Acute kidney failure, unspecified: Secondary | ICD-10-CM | POA: Diagnosis not present

## 2021-07-17 DIAGNOSIS — I513 Intracardiac thrombosis, not elsewhere classified: Secondary | ICD-10-CM | POA: Diagnosis not present

## 2021-07-17 DIAGNOSIS — I63512 Cerebral infarction due to unspecified occlusion or stenosis of left middle cerebral artery: Secondary | ICD-10-CM | POA: Diagnosis not present

## 2021-07-17 DIAGNOSIS — I5042 Chronic combined systolic (congestive) and diastolic (congestive) heart failure: Secondary | ICD-10-CM | POA: Diagnosis not present

## 2021-07-17 DIAGNOSIS — M6281 Muscle weakness (generalized): Secondary | ICD-10-CM | POA: Diagnosis not present

## 2021-07-17 DIAGNOSIS — I69321 Dysphasia following cerebral infarction: Secondary | ICD-10-CM | POA: Diagnosis not present

## 2021-07-21 DIAGNOSIS — I513 Intracardiac thrombosis, not elsewhere classified: Secondary | ICD-10-CM | POA: Diagnosis not present

## 2021-07-21 DIAGNOSIS — M6281 Muscle weakness (generalized): Secondary | ICD-10-CM | POA: Diagnosis not present

## 2021-07-21 DIAGNOSIS — I63412 Cerebral infarction due to embolism of left middle cerebral artery: Secondary | ICD-10-CM | POA: Diagnosis not present

## 2021-07-21 DIAGNOSIS — N179 Acute kidney failure, unspecified: Secondary | ICD-10-CM | POA: Diagnosis not present

## 2021-07-21 DIAGNOSIS — I69320 Aphasia following cerebral infarction: Secondary | ICD-10-CM | POA: Diagnosis not present

## 2021-07-21 DIAGNOSIS — I5042 Chronic combined systolic (congestive) and diastolic (congestive) heart failure: Secondary | ICD-10-CM | POA: Diagnosis not present

## 2021-07-21 DIAGNOSIS — I69351 Hemiplegia and hemiparesis following cerebral infarction affecting right dominant side: Secondary | ICD-10-CM | POA: Diagnosis not present

## 2021-07-21 DIAGNOSIS — I63512 Cerebral infarction due to unspecified occlusion or stenosis of left middle cerebral artery: Secondary | ICD-10-CM | POA: Diagnosis not present

## 2021-07-21 DIAGNOSIS — I69321 Dysphasia following cerebral infarction: Secondary | ICD-10-CM | POA: Diagnosis not present

## 2021-07-22 DIAGNOSIS — I69321 Dysphasia following cerebral infarction: Secondary | ICD-10-CM | POA: Diagnosis not present

## 2021-07-22 DIAGNOSIS — I513 Intracardiac thrombosis, not elsewhere classified: Secondary | ICD-10-CM | POA: Diagnosis not present

## 2021-07-22 DIAGNOSIS — M6281 Muscle weakness (generalized): Secondary | ICD-10-CM | POA: Diagnosis not present

## 2021-07-22 DIAGNOSIS — I63512 Cerebral infarction due to unspecified occlusion or stenosis of left middle cerebral artery: Secondary | ICD-10-CM | POA: Diagnosis not present

## 2021-07-22 DIAGNOSIS — I63412 Cerebral infarction due to embolism of left middle cerebral artery: Secondary | ICD-10-CM | POA: Diagnosis not present

## 2021-07-22 DIAGNOSIS — N179 Acute kidney failure, unspecified: Secondary | ICD-10-CM | POA: Diagnosis not present

## 2021-07-22 DIAGNOSIS — I69351 Hemiplegia and hemiparesis following cerebral infarction affecting right dominant side: Secondary | ICD-10-CM | POA: Diagnosis not present

## 2021-07-22 DIAGNOSIS — I5042 Chronic combined systolic (congestive) and diastolic (congestive) heart failure: Secondary | ICD-10-CM | POA: Diagnosis not present

## 2021-07-22 DIAGNOSIS — I69320 Aphasia following cerebral infarction: Secondary | ICD-10-CM | POA: Diagnosis not present

## 2021-07-24 DIAGNOSIS — M6281 Muscle weakness (generalized): Secondary | ICD-10-CM | POA: Diagnosis not present

## 2021-07-24 DIAGNOSIS — I69320 Aphasia following cerebral infarction: Secondary | ICD-10-CM | POA: Diagnosis not present

## 2021-07-24 DIAGNOSIS — I69351 Hemiplegia and hemiparesis following cerebral infarction affecting right dominant side: Secondary | ICD-10-CM | POA: Diagnosis not present

## 2021-07-24 DIAGNOSIS — I63412 Cerebral infarction due to embolism of left middle cerebral artery: Secondary | ICD-10-CM | POA: Diagnosis not present

## 2021-07-24 DIAGNOSIS — I69321 Dysphasia following cerebral infarction: Secondary | ICD-10-CM | POA: Diagnosis not present

## 2021-07-24 DIAGNOSIS — I513 Intracardiac thrombosis, not elsewhere classified: Secondary | ICD-10-CM | POA: Diagnosis not present

## 2021-07-24 DIAGNOSIS — I63512 Cerebral infarction due to unspecified occlusion or stenosis of left middle cerebral artery: Secondary | ICD-10-CM | POA: Diagnosis not present

## 2021-07-24 DIAGNOSIS — I5042 Chronic combined systolic (congestive) and diastolic (congestive) heart failure: Secondary | ICD-10-CM | POA: Diagnosis not present

## 2021-07-24 DIAGNOSIS — N179 Acute kidney failure, unspecified: Secondary | ICD-10-CM | POA: Diagnosis not present

## 2021-07-26 DIAGNOSIS — M6281 Muscle weakness (generalized): Secondary | ICD-10-CM | POA: Diagnosis not present

## 2021-07-26 DIAGNOSIS — I63412 Cerebral infarction due to embolism of left middle cerebral artery: Secondary | ICD-10-CM | POA: Diagnosis not present

## 2021-07-26 DIAGNOSIS — N179 Acute kidney failure, unspecified: Secondary | ICD-10-CM | POA: Diagnosis not present

## 2021-07-26 DIAGNOSIS — I5042 Chronic combined systolic (congestive) and diastolic (congestive) heart failure: Secondary | ICD-10-CM | POA: Diagnosis not present

## 2021-07-26 DIAGNOSIS — I513 Intracardiac thrombosis, not elsewhere classified: Secondary | ICD-10-CM | POA: Diagnosis not present

## 2021-07-26 DIAGNOSIS — I69351 Hemiplegia and hemiparesis following cerebral infarction affecting right dominant side: Secondary | ICD-10-CM | POA: Diagnosis not present

## 2021-07-26 DIAGNOSIS — I69320 Aphasia following cerebral infarction: Secondary | ICD-10-CM | POA: Diagnosis not present

## 2021-07-26 DIAGNOSIS — I69321 Dysphasia following cerebral infarction: Secondary | ICD-10-CM | POA: Diagnosis not present

## 2021-07-26 DIAGNOSIS — I63512 Cerebral infarction due to unspecified occlusion or stenosis of left middle cerebral artery: Secondary | ICD-10-CM | POA: Diagnosis not present

## 2021-07-29 DIAGNOSIS — I69321 Dysphasia following cerebral infarction: Secondary | ICD-10-CM | POA: Diagnosis not present

## 2021-07-29 DIAGNOSIS — I5042 Chronic combined systolic (congestive) and diastolic (congestive) heart failure: Secondary | ICD-10-CM | POA: Diagnosis not present

## 2021-07-29 DIAGNOSIS — I69320 Aphasia following cerebral infarction: Secondary | ICD-10-CM | POA: Diagnosis not present

## 2021-07-29 DIAGNOSIS — I63412 Cerebral infarction due to embolism of left middle cerebral artery: Secondary | ICD-10-CM | POA: Diagnosis not present

## 2021-07-29 DIAGNOSIS — I513 Intracardiac thrombosis, not elsewhere classified: Secondary | ICD-10-CM | POA: Diagnosis not present

## 2021-07-29 DIAGNOSIS — N179 Acute kidney failure, unspecified: Secondary | ICD-10-CM | POA: Diagnosis not present

## 2021-07-31 DIAGNOSIS — G8190 Hemiplegia, unspecified affecting unspecified side: Secondary | ICD-10-CM | POA: Diagnosis not present

## 2021-07-31 DIAGNOSIS — I69321 Dysphasia following cerebral infarction: Secondary | ICD-10-CM | POA: Diagnosis not present

## 2021-07-31 DIAGNOSIS — I7091 Generalized atherosclerosis: Secondary | ICD-10-CM | POA: Diagnosis not present

## 2021-07-31 DIAGNOSIS — I513 Intracardiac thrombosis, not elsewhere classified: Secondary | ICD-10-CM | POA: Diagnosis not present

## 2021-07-31 DIAGNOSIS — I63412 Cerebral infarction due to embolism of left middle cerebral artery: Secondary | ICD-10-CM | POA: Diagnosis not present

## 2021-07-31 DIAGNOSIS — N179 Acute kidney failure, unspecified: Secondary | ICD-10-CM | POA: Diagnosis not present

## 2021-07-31 DIAGNOSIS — I69320 Aphasia following cerebral infarction: Secondary | ICD-10-CM | POA: Diagnosis not present

## 2021-07-31 DIAGNOSIS — N189 Chronic kidney disease, unspecified: Secondary | ICD-10-CM | POA: Diagnosis not present

## 2021-07-31 DIAGNOSIS — I5042 Chronic combined systolic (congestive) and diastolic (congestive) heart failure: Secondary | ICD-10-CM | POA: Diagnosis not present

## 2021-08-04 DIAGNOSIS — I69321 Dysphasia following cerebral infarction: Secondary | ICD-10-CM | POA: Diagnosis not present

## 2021-08-04 DIAGNOSIS — N179 Acute kidney failure, unspecified: Secondary | ICD-10-CM | POA: Diagnosis not present

## 2021-08-04 DIAGNOSIS — I69320 Aphasia following cerebral infarction: Secondary | ICD-10-CM | POA: Diagnosis not present

## 2021-08-04 DIAGNOSIS — I63412 Cerebral infarction due to embolism of left middle cerebral artery: Secondary | ICD-10-CM | POA: Diagnosis not present

## 2021-08-04 DIAGNOSIS — I5042 Chronic combined systolic (congestive) and diastolic (congestive) heart failure: Secondary | ICD-10-CM | POA: Diagnosis not present

## 2021-08-04 DIAGNOSIS — I513 Intracardiac thrombosis, not elsewhere classified: Secondary | ICD-10-CM | POA: Diagnosis not present

## 2021-08-05 DIAGNOSIS — I513 Intracardiac thrombosis, not elsewhere classified: Secondary | ICD-10-CM | POA: Diagnosis not present

## 2021-08-05 DIAGNOSIS — I63412 Cerebral infarction due to embolism of left middle cerebral artery: Secondary | ICD-10-CM | POA: Diagnosis not present

## 2021-08-05 DIAGNOSIS — I69320 Aphasia following cerebral infarction: Secondary | ICD-10-CM | POA: Diagnosis not present

## 2021-08-05 DIAGNOSIS — F331 Major depressive disorder, recurrent, moderate: Secondary | ICD-10-CM | POA: Diagnosis not present

## 2021-08-05 DIAGNOSIS — I69321 Dysphasia following cerebral infarction: Secondary | ICD-10-CM | POA: Diagnosis not present

## 2021-08-05 DIAGNOSIS — R419 Unspecified symptoms and signs involving cognitive functions and awareness: Secondary | ICD-10-CM | POA: Diagnosis not present

## 2021-08-05 DIAGNOSIS — N179 Acute kidney failure, unspecified: Secondary | ICD-10-CM | POA: Diagnosis not present

## 2021-08-05 DIAGNOSIS — I5042 Chronic combined systolic (congestive) and diastolic (congestive) heart failure: Secondary | ICD-10-CM | POA: Diagnosis not present

## 2021-08-06 DIAGNOSIS — H2513 Age-related nuclear cataract, bilateral: Secondary | ICD-10-CM | POA: Diagnosis not present

## 2021-08-06 DIAGNOSIS — H40013 Open angle with borderline findings, low risk, bilateral: Secondary | ICD-10-CM | POA: Diagnosis not present

## 2021-08-07 DIAGNOSIS — I69320 Aphasia following cerebral infarction: Secondary | ICD-10-CM | POA: Diagnosis not present

## 2021-08-07 DIAGNOSIS — N179 Acute kidney failure, unspecified: Secondary | ICD-10-CM | POA: Diagnosis not present

## 2021-08-07 DIAGNOSIS — I513 Intracardiac thrombosis, not elsewhere classified: Secondary | ICD-10-CM | POA: Diagnosis not present

## 2021-08-07 DIAGNOSIS — I63412 Cerebral infarction due to embolism of left middle cerebral artery: Secondary | ICD-10-CM | POA: Diagnosis not present

## 2021-08-07 DIAGNOSIS — I69321 Dysphasia following cerebral infarction: Secondary | ICD-10-CM | POA: Diagnosis not present

## 2021-08-07 DIAGNOSIS — I5042 Chronic combined systolic (congestive) and diastolic (congestive) heart failure: Secondary | ICD-10-CM | POA: Diagnosis not present

## 2021-08-08 DIAGNOSIS — I513 Intracardiac thrombosis, not elsewhere classified: Secondary | ICD-10-CM | POA: Diagnosis not present

## 2021-08-08 DIAGNOSIS — I69320 Aphasia following cerebral infarction: Secondary | ICD-10-CM | POA: Diagnosis not present

## 2021-08-08 DIAGNOSIS — I63412 Cerebral infarction due to embolism of left middle cerebral artery: Secondary | ICD-10-CM | POA: Diagnosis not present

## 2021-08-08 DIAGNOSIS — N179 Acute kidney failure, unspecified: Secondary | ICD-10-CM | POA: Diagnosis not present

## 2021-08-08 DIAGNOSIS — I5042 Chronic combined systolic (congestive) and diastolic (congestive) heart failure: Secondary | ICD-10-CM | POA: Diagnosis not present

## 2021-08-08 DIAGNOSIS — I69321 Dysphasia following cerebral infarction: Secondary | ICD-10-CM | POA: Diagnosis not present

## 2021-08-12 DIAGNOSIS — I69320 Aphasia following cerebral infarction: Secondary | ICD-10-CM | POA: Diagnosis not present

## 2021-08-12 DIAGNOSIS — I69321 Dysphasia following cerebral infarction: Secondary | ICD-10-CM | POA: Diagnosis not present

## 2021-08-12 DIAGNOSIS — I63412 Cerebral infarction due to embolism of left middle cerebral artery: Secondary | ICD-10-CM | POA: Diagnosis not present

## 2021-08-12 DIAGNOSIS — N179 Acute kidney failure, unspecified: Secondary | ICD-10-CM | POA: Diagnosis not present

## 2021-08-12 DIAGNOSIS — I513 Intracardiac thrombosis, not elsewhere classified: Secondary | ICD-10-CM | POA: Diagnosis not present

## 2021-08-12 DIAGNOSIS — I5042 Chronic combined systolic (congestive) and diastolic (congestive) heart failure: Secondary | ICD-10-CM | POA: Diagnosis not present

## 2021-08-14 DIAGNOSIS — I5042 Chronic combined systolic (congestive) and diastolic (congestive) heart failure: Secondary | ICD-10-CM | POA: Diagnosis not present

## 2021-08-14 DIAGNOSIS — I69321 Dysphasia following cerebral infarction: Secondary | ICD-10-CM | POA: Diagnosis not present

## 2021-08-14 DIAGNOSIS — I69320 Aphasia following cerebral infarction: Secondary | ICD-10-CM | POA: Diagnosis not present

## 2021-08-14 DIAGNOSIS — I513 Intracardiac thrombosis, not elsewhere classified: Secondary | ICD-10-CM | POA: Diagnosis not present

## 2021-08-14 DIAGNOSIS — N179 Acute kidney failure, unspecified: Secondary | ICD-10-CM | POA: Diagnosis not present

## 2021-08-14 DIAGNOSIS — I63412 Cerebral infarction due to embolism of left middle cerebral artery: Secondary | ICD-10-CM | POA: Diagnosis not present

## 2021-08-16 DIAGNOSIS — I5042 Chronic combined systolic (congestive) and diastolic (congestive) heart failure: Secondary | ICD-10-CM | POA: Diagnosis not present

## 2021-08-16 DIAGNOSIS — I69321 Dysphasia following cerebral infarction: Secondary | ICD-10-CM | POA: Diagnosis not present

## 2021-08-16 DIAGNOSIS — I513 Intracardiac thrombosis, not elsewhere classified: Secondary | ICD-10-CM | POA: Diagnosis not present

## 2021-08-16 DIAGNOSIS — N179 Acute kidney failure, unspecified: Secondary | ICD-10-CM | POA: Diagnosis not present

## 2021-08-16 DIAGNOSIS — I69320 Aphasia following cerebral infarction: Secondary | ICD-10-CM | POA: Diagnosis not present

## 2021-08-16 DIAGNOSIS — I63412 Cerebral infarction due to embolism of left middle cerebral artery: Secondary | ICD-10-CM | POA: Diagnosis not present

## 2021-08-18 DIAGNOSIS — Z7901 Long term (current) use of anticoagulants: Secondary | ICD-10-CM | POA: Diagnosis not present

## 2021-08-18 DIAGNOSIS — N950 Postmenopausal bleeding: Secondary | ICD-10-CM | POA: Diagnosis not present

## 2021-08-19 DIAGNOSIS — N179 Acute kidney failure, unspecified: Secondary | ICD-10-CM | POA: Diagnosis not present

## 2021-08-19 DIAGNOSIS — I69320 Aphasia following cerebral infarction: Secondary | ICD-10-CM | POA: Diagnosis not present

## 2021-08-19 DIAGNOSIS — I69321 Dysphasia following cerebral infarction: Secondary | ICD-10-CM | POA: Diagnosis not present

## 2021-08-19 DIAGNOSIS — I513 Intracardiac thrombosis, not elsewhere classified: Secondary | ICD-10-CM | POA: Diagnosis not present

## 2021-08-19 DIAGNOSIS — I5042 Chronic combined systolic (congestive) and diastolic (congestive) heart failure: Secondary | ICD-10-CM | POA: Diagnosis not present

## 2021-08-19 DIAGNOSIS — I63412 Cerebral infarction due to embolism of left middle cerebral artery: Secondary | ICD-10-CM | POA: Diagnosis not present

## 2021-08-21 DIAGNOSIS — I513 Intracardiac thrombosis, not elsewhere classified: Secondary | ICD-10-CM | POA: Diagnosis not present

## 2021-08-21 DIAGNOSIS — I69320 Aphasia following cerebral infarction: Secondary | ICD-10-CM | POA: Diagnosis not present

## 2021-08-21 DIAGNOSIS — N179 Acute kidney failure, unspecified: Secondary | ICD-10-CM | POA: Diagnosis not present

## 2021-08-21 DIAGNOSIS — I63412 Cerebral infarction due to embolism of left middle cerebral artery: Secondary | ICD-10-CM | POA: Diagnosis not present

## 2021-08-21 DIAGNOSIS — I5042 Chronic combined systolic (congestive) and diastolic (congestive) heart failure: Secondary | ICD-10-CM | POA: Diagnosis not present

## 2021-08-21 DIAGNOSIS — I69321 Dysphasia following cerebral infarction: Secondary | ICD-10-CM | POA: Diagnosis not present

## 2021-08-23 DIAGNOSIS — I69320 Aphasia following cerebral infarction: Secondary | ICD-10-CM | POA: Diagnosis not present

## 2021-08-23 DIAGNOSIS — N179 Acute kidney failure, unspecified: Secondary | ICD-10-CM | POA: Diagnosis not present

## 2021-08-23 DIAGNOSIS — I69321 Dysphasia following cerebral infarction: Secondary | ICD-10-CM | POA: Diagnosis not present

## 2021-08-23 DIAGNOSIS — I63412 Cerebral infarction due to embolism of left middle cerebral artery: Secondary | ICD-10-CM | POA: Diagnosis not present

## 2021-08-23 DIAGNOSIS — I513 Intracardiac thrombosis, not elsewhere classified: Secondary | ICD-10-CM | POA: Diagnosis not present

## 2021-08-23 DIAGNOSIS — I5042 Chronic combined systolic (congestive) and diastolic (congestive) heart failure: Secondary | ICD-10-CM | POA: Diagnosis not present

## 2021-08-26 DIAGNOSIS — N179 Acute kidney failure, unspecified: Secondary | ICD-10-CM | POA: Diagnosis not present

## 2021-08-26 DIAGNOSIS — I513 Intracardiac thrombosis, not elsewhere classified: Secondary | ICD-10-CM | POA: Diagnosis not present

## 2021-08-26 DIAGNOSIS — I69320 Aphasia following cerebral infarction: Secondary | ICD-10-CM | POA: Diagnosis not present

## 2021-08-26 DIAGNOSIS — I63412 Cerebral infarction due to embolism of left middle cerebral artery: Secondary | ICD-10-CM | POA: Diagnosis not present

## 2021-08-26 DIAGNOSIS — I5042 Chronic combined systolic (congestive) and diastolic (congestive) heart failure: Secondary | ICD-10-CM | POA: Diagnosis not present

## 2021-08-26 DIAGNOSIS — I69321 Dysphasia following cerebral infarction: Secondary | ICD-10-CM | POA: Diagnosis not present

## 2021-08-27 DIAGNOSIS — I10 Essential (primary) hypertension: Secondary | ICD-10-CM | POA: Diagnosis not present

## 2021-08-28 DIAGNOSIS — I69320 Aphasia following cerebral infarction: Secondary | ICD-10-CM | POA: Diagnosis not present

## 2021-08-28 DIAGNOSIS — I69321 Dysphasia following cerebral infarction: Secondary | ICD-10-CM | POA: Diagnosis not present

## 2021-08-28 DIAGNOSIS — I69351 Hemiplegia and hemiparesis following cerebral infarction affecting right dominant side: Secondary | ICD-10-CM | POA: Diagnosis not present

## 2021-08-28 DIAGNOSIS — I63412 Cerebral infarction due to embolism of left middle cerebral artery: Secondary | ICD-10-CM | POA: Diagnosis not present

## 2021-08-28 DIAGNOSIS — I513 Intracardiac thrombosis, not elsewhere classified: Secondary | ICD-10-CM | POA: Diagnosis not present

## 2021-08-28 DIAGNOSIS — N179 Acute kidney failure, unspecified: Secondary | ICD-10-CM | POA: Diagnosis not present

## 2021-08-28 DIAGNOSIS — M6281 Muscle weakness (generalized): Secondary | ICD-10-CM | POA: Diagnosis not present

## 2021-08-28 DIAGNOSIS — I5042 Chronic combined systolic (congestive) and diastolic (congestive) heart failure: Secondary | ICD-10-CM | POA: Diagnosis not present

## 2021-08-30 DIAGNOSIS — I63412 Cerebral infarction due to embolism of left middle cerebral artery: Secondary | ICD-10-CM | POA: Diagnosis not present

## 2021-08-30 DIAGNOSIS — N179 Acute kidney failure, unspecified: Secondary | ICD-10-CM | POA: Diagnosis not present

## 2021-08-30 DIAGNOSIS — I69321 Dysphasia following cerebral infarction: Secondary | ICD-10-CM | POA: Diagnosis not present

## 2021-08-30 DIAGNOSIS — I69351 Hemiplegia and hemiparesis following cerebral infarction affecting right dominant side: Secondary | ICD-10-CM | POA: Diagnosis not present

## 2021-08-30 DIAGNOSIS — I513 Intracardiac thrombosis, not elsewhere classified: Secondary | ICD-10-CM | POA: Diagnosis not present

## 2021-08-30 DIAGNOSIS — I5042 Chronic combined systolic (congestive) and diastolic (congestive) heart failure: Secondary | ICD-10-CM | POA: Diagnosis not present

## 2021-08-30 DIAGNOSIS — M6281 Muscle weakness (generalized): Secondary | ICD-10-CM | POA: Diagnosis not present

## 2021-08-30 DIAGNOSIS — I69320 Aphasia following cerebral infarction: Secondary | ICD-10-CM | POA: Diagnosis not present

## 2021-09-01 DIAGNOSIS — I10 Essential (primary) hypertension: Secondary | ICD-10-CM | POA: Diagnosis not present

## 2021-09-01 DIAGNOSIS — E785 Hyperlipidemia, unspecified: Secondary | ICD-10-CM | POA: Diagnosis not present

## 2021-09-01 DIAGNOSIS — I4891 Unspecified atrial fibrillation: Secondary | ICD-10-CM | POA: Diagnosis not present

## 2021-09-01 DIAGNOSIS — N950 Postmenopausal bleeding: Secondary | ICD-10-CM | POA: Diagnosis not present

## 2021-09-01 DIAGNOSIS — K219 Gastro-esophageal reflux disease without esophagitis: Secondary | ICD-10-CM | POA: Diagnosis not present

## 2021-09-02 DIAGNOSIS — N179 Acute kidney failure, unspecified: Secondary | ICD-10-CM | POA: Diagnosis not present

## 2021-09-02 DIAGNOSIS — I69320 Aphasia following cerebral infarction: Secondary | ICD-10-CM | POA: Diagnosis not present

## 2021-09-02 DIAGNOSIS — I5042 Chronic combined systolic (congestive) and diastolic (congestive) heart failure: Secondary | ICD-10-CM | POA: Diagnosis not present

## 2021-09-02 DIAGNOSIS — I69321 Dysphasia following cerebral infarction: Secondary | ICD-10-CM | POA: Diagnosis not present

## 2021-09-02 DIAGNOSIS — I63412 Cerebral infarction due to embolism of left middle cerebral artery: Secondary | ICD-10-CM | POA: Diagnosis not present

## 2021-09-02 DIAGNOSIS — I69351 Hemiplegia and hemiparesis following cerebral infarction affecting right dominant side: Secondary | ICD-10-CM | POA: Diagnosis not present

## 2021-09-02 DIAGNOSIS — M6281 Muscle weakness (generalized): Secondary | ICD-10-CM | POA: Diagnosis not present

## 2021-09-02 DIAGNOSIS — I513 Intracardiac thrombosis, not elsewhere classified: Secondary | ICD-10-CM | POA: Diagnosis not present

## 2021-09-06 DIAGNOSIS — I63412 Cerebral infarction due to embolism of left middle cerebral artery: Secondary | ICD-10-CM | POA: Diagnosis not present

## 2021-09-06 DIAGNOSIS — I5042 Chronic combined systolic (congestive) and diastolic (congestive) heart failure: Secondary | ICD-10-CM | POA: Diagnosis not present

## 2021-09-06 DIAGNOSIS — I69321 Dysphasia following cerebral infarction: Secondary | ICD-10-CM | POA: Diagnosis not present

## 2021-09-06 DIAGNOSIS — N179 Acute kidney failure, unspecified: Secondary | ICD-10-CM | POA: Diagnosis not present

## 2021-09-06 DIAGNOSIS — M6281 Muscle weakness (generalized): Secondary | ICD-10-CM | POA: Diagnosis not present

## 2021-09-06 DIAGNOSIS — I69351 Hemiplegia and hemiparesis following cerebral infarction affecting right dominant side: Secondary | ICD-10-CM | POA: Diagnosis not present

## 2021-09-06 DIAGNOSIS — I513 Intracardiac thrombosis, not elsewhere classified: Secondary | ICD-10-CM | POA: Diagnosis not present

## 2021-09-06 DIAGNOSIS — I69320 Aphasia following cerebral infarction: Secondary | ICD-10-CM | POA: Diagnosis not present

## 2021-09-08 ENCOUNTER — Other Ambulatory Visit: Payer: Self-pay | Admitting: Internal Medicine

## 2021-09-08 ENCOUNTER — Other Ambulatory Visit (HOSPITAL_COMMUNITY): Payer: Self-pay | Admitting: Internal Medicine

## 2021-09-08 DIAGNOSIS — R1310 Dysphagia, unspecified: Secondary | ICD-10-CM

## 2021-09-08 DIAGNOSIS — I69320 Aphasia following cerebral infarction: Secondary | ICD-10-CM | POA: Diagnosis not present

## 2021-09-08 DIAGNOSIS — I69321 Dysphasia following cerebral infarction: Secondary | ICD-10-CM | POA: Diagnosis not present

## 2021-09-08 DIAGNOSIS — I69351 Hemiplegia and hemiparesis following cerebral infarction affecting right dominant side: Secondary | ICD-10-CM | POA: Diagnosis not present

## 2021-09-08 DIAGNOSIS — I63412 Cerebral infarction due to embolism of left middle cerebral artery: Secondary | ICD-10-CM | POA: Diagnosis not present

## 2021-09-08 DIAGNOSIS — N179 Acute kidney failure, unspecified: Secondary | ICD-10-CM | POA: Diagnosis not present

## 2021-09-08 DIAGNOSIS — M6281 Muscle weakness (generalized): Secondary | ICD-10-CM | POA: Diagnosis not present

## 2021-09-08 DIAGNOSIS — I5042 Chronic combined systolic (congestive) and diastolic (congestive) heart failure: Secondary | ICD-10-CM | POA: Diagnosis not present

## 2021-09-08 DIAGNOSIS — I513 Intracardiac thrombosis, not elsewhere classified: Secondary | ICD-10-CM | POA: Diagnosis not present

## 2021-09-09 DIAGNOSIS — I63412 Cerebral infarction due to embolism of left middle cerebral artery: Secondary | ICD-10-CM | POA: Diagnosis not present

## 2021-09-09 DIAGNOSIS — R419 Unspecified symptoms and signs involving cognitive functions and awareness: Secondary | ICD-10-CM | POA: Diagnosis not present

## 2021-09-09 DIAGNOSIS — I69321 Dysphasia following cerebral infarction: Secondary | ICD-10-CM | POA: Diagnosis not present

## 2021-09-09 DIAGNOSIS — I513 Intracardiac thrombosis, not elsewhere classified: Secondary | ICD-10-CM | POA: Diagnosis not present

## 2021-09-09 DIAGNOSIS — N179 Acute kidney failure, unspecified: Secondary | ICD-10-CM | POA: Diagnosis not present

## 2021-09-09 DIAGNOSIS — M6281 Muscle weakness (generalized): Secondary | ICD-10-CM | POA: Diagnosis not present

## 2021-09-09 DIAGNOSIS — I69351 Hemiplegia and hemiparesis following cerebral infarction affecting right dominant side: Secondary | ICD-10-CM | POA: Diagnosis not present

## 2021-09-09 DIAGNOSIS — I5042 Chronic combined systolic (congestive) and diastolic (congestive) heart failure: Secondary | ICD-10-CM | POA: Diagnosis not present

## 2021-09-09 DIAGNOSIS — F331 Major depressive disorder, recurrent, moderate: Secondary | ICD-10-CM | POA: Diagnosis not present

## 2021-09-09 DIAGNOSIS — I69320 Aphasia following cerebral infarction: Secondary | ICD-10-CM | POA: Diagnosis not present

## 2021-09-10 DIAGNOSIS — M6281 Muscle weakness (generalized): Secondary | ICD-10-CM | POA: Diagnosis not present

## 2021-09-10 DIAGNOSIS — I5042 Chronic combined systolic (congestive) and diastolic (congestive) heart failure: Secondary | ICD-10-CM | POA: Diagnosis not present

## 2021-09-10 DIAGNOSIS — I69351 Hemiplegia and hemiparesis following cerebral infarction affecting right dominant side: Secondary | ICD-10-CM | POA: Diagnosis not present

## 2021-09-10 DIAGNOSIS — N179 Acute kidney failure, unspecified: Secondary | ICD-10-CM | POA: Diagnosis not present

## 2021-09-10 DIAGNOSIS — I513 Intracardiac thrombosis, not elsewhere classified: Secondary | ICD-10-CM | POA: Diagnosis not present

## 2021-09-10 DIAGNOSIS — I69320 Aphasia following cerebral infarction: Secondary | ICD-10-CM | POA: Diagnosis not present

## 2021-09-10 DIAGNOSIS — I69321 Dysphasia following cerebral infarction: Secondary | ICD-10-CM | POA: Diagnosis not present

## 2021-09-10 DIAGNOSIS — I63412 Cerebral infarction due to embolism of left middle cerebral artery: Secondary | ICD-10-CM | POA: Diagnosis not present

## 2021-09-11 DIAGNOSIS — I63412 Cerebral infarction due to embolism of left middle cerebral artery: Secondary | ICD-10-CM | POA: Diagnosis not present

## 2021-09-11 DIAGNOSIS — M6281 Muscle weakness (generalized): Secondary | ICD-10-CM | POA: Diagnosis not present

## 2021-09-11 DIAGNOSIS — I5042 Chronic combined systolic (congestive) and diastolic (congestive) heart failure: Secondary | ICD-10-CM | POA: Diagnosis not present

## 2021-09-11 DIAGNOSIS — I513 Intracardiac thrombosis, not elsewhere classified: Secondary | ICD-10-CM | POA: Diagnosis not present

## 2021-09-11 DIAGNOSIS — N179 Acute kidney failure, unspecified: Secondary | ICD-10-CM | POA: Diagnosis not present

## 2021-09-11 DIAGNOSIS — I69320 Aphasia following cerebral infarction: Secondary | ICD-10-CM | POA: Diagnosis not present

## 2021-09-11 DIAGNOSIS — I69351 Hemiplegia and hemiparesis following cerebral infarction affecting right dominant side: Secondary | ICD-10-CM | POA: Diagnosis not present

## 2021-09-11 DIAGNOSIS — I69321 Dysphasia following cerebral infarction: Secondary | ICD-10-CM | POA: Diagnosis not present

## 2021-09-12 DIAGNOSIS — H2513 Age-related nuclear cataract, bilateral: Secondary | ICD-10-CM | POA: Diagnosis not present

## 2021-09-12 DIAGNOSIS — H40013 Open angle with borderline findings, low risk, bilateral: Secondary | ICD-10-CM | POA: Diagnosis not present

## 2021-09-12 DIAGNOSIS — H524 Presbyopia: Secondary | ICD-10-CM | POA: Diagnosis not present

## 2021-09-15 ENCOUNTER — Ambulatory Visit (HOSPITAL_COMMUNITY): Admission: RE | Admit: 2021-09-15 | Payer: Medicare HMO | Source: Ambulatory Visit

## 2021-09-15 ENCOUNTER — Other Ambulatory Visit: Payer: Self-pay

## 2021-09-15 ENCOUNTER — Encounter (HOSPITAL_COMMUNITY): Payer: Self-pay

## 2021-09-15 ENCOUNTER — Ambulatory Visit (HOSPITAL_COMMUNITY)
Admission: RE | Admit: 2021-09-15 | Discharge: 2021-09-15 | Disposition: A | Discharge status: Home / Self Care | Payer: Medicare HMO | Source: Ambulatory Visit | Attending: Internal Medicine | Admitting: Internal Medicine

## 2021-09-15 ENCOUNTER — Other Ambulatory Visit (HOSPITAL_COMMUNITY): Payer: Self-pay

## 2021-09-15 ENCOUNTER — Ambulatory Visit (HOSPITAL_COMMUNITY): Payer: No Typology Code available for payment source

## 2021-09-15 DIAGNOSIS — E119 Type 2 diabetes mellitus without complications: Secondary | ICD-10-CM | POA: Diagnosis not present

## 2021-09-15 DIAGNOSIS — R4189 Other symptoms and signs involving cognitive functions and awareness: Secondary | ICD-10-CM | POA: Diagnosis not present

## 2021-09-15 DIAGNOSIS — I10 Essential (primary) hypertension: Secondary | ICD-10-CM | POA: Diagnosis not present

## 2021-09-15 DIAGNOSIS — R531 Weakness: Secondary | ICD-10-CM | POA: Diagnosis not present

## 2021-09-15 DIAGNOSIS — R1310 Dysphagia, unspecified: Secondary | ICD-10-CM

## 2021-09-15 DIAGNOSIS — E559 Vitamin D deficiency, unspecified: Secondary | ICD-10-CM | POA: Diagnosis not present

## 2021-09-15 DIAGNOSIS — R946 Abnormal results of thyroid function studies: Secondary | ICD-10-CM | POA: Diagnosis not present

## 2021-09-15 DIAGNOSIS — W19XXXD Unspecified fall, subsequent encounter: Secondary | ICD-10-CM | POA: Diagnosis not present

## 2021-09-16 DIAGNOSIS — E559 Vitamin D deficiency, unspecified: Secondary | ICD-10-CM | POA: Diagnosis not present

## 2021-09-16 DIAGNOSIS — I63412 Cerebral infarction due to embolism of left middle cerebral artery: Secondary | ICD-10-CM | POA: Diagnosis not present

## 2021-09-16 DIAGNOSIS — I69320 Aphasia following cerebral infarction: Secondary | ICD-10-CM | POA: Diagnosis not present

## 2021-09-16 DIAGNOSIS — M6281 Muscle weakness (generalized): Secondary | ICD-10-CM | POA: Diagnosis not present

## 2021-09-16 DIAGNOSIS — I513 Intracardiac thrombosis, not elsewhere classified: Secondary | ICD-10-CM | POA: Diagnosis not present

## 2021-09-16 DIAGNOSIS — I69321 Dysphasia following cerebral infarction: Secondary | ICD-10-CM | POA: Diagnosis not present

## 2021-09-16 DIAGNOSIS — I69351 Hemiplegia and hemiparesis following cerebral infarction affecting right dominant side: Secondary | ICD-10-CM | POA: Diagnosis not present

## 2021-09-16 DIAGNOSIS — N179 Acute kidney failure, unspecified: Secondary | ICD-10-CM | POA: Diagnosis not present

## 2021-09-16 DIAGNOSIS — I5042 Chronic combined systolic (congestive) and diastolic (congestive) heart failure: Secondary | ICD-10-CM | POA: Diagnosis not present

## 2021-09-17 DIAGNOSIS — R4701 Aphasia: Secondary | ICD-10-CM | POA: Diagnosis not present

## 2021-09-18 DIAGNOSIS — F419 Anxiety disorder, unspecified: Secondary | ICD-10-CM | POA: Diagnosis not present

## 2021-09-18 DIAGNOSIS — I513 Intracardiac thrombosis, not elsewhere classified: Secondary | ICD-10-CM | POA: Diagnosis not present

## 2021-09-18 DIAGNOSIS — I5042 Chronic combined systolic (congestive) and diastolic (congestive) heart failure: Secondary | ICD-10-CM | POA: Diagnosis not present

## 2021-09-18 DIAGNOSIS — I69321 Dysphasia following cerebral infarction: Secondary | ICD-10-CM | POA: Diagnosis not present

## 2021-09-18 DIAGNOSIS — D649 Anemia, unspecified: Secondary | ICD-10-CM | POA: Diagnosis not present

## 2021-09-18 DIAGNOSIS — I69351 Hemiplegia and hemiparesis following cerebral infarction affecting right dominant side: Secondary | ICD-10-CM | POA: Diagnosis not present

## 2021-09-18 DIAGNOSIS — N179 Acute kidney failure, unspecified: Secondary | ICD-10-CM | POA: Diagnosis not present

## 2021-09-18 DIAGNOSIS — I69320 Aphasia following cerebral infarction: Secondary | ICD-10-CM | POA: Diagnosis not present

## 2021-09-18 DIAGNOSIS — I63412 Cerebral infarction due to embolism of left middle cerebral artery: Secondary | ICD-10-CM | POA: Diagnosis not present

## 2021-09-18 DIAGNOSIS — N950 Postmenopausal bleeding: Secondary | ICD-10-CM | POA: Diagnosis not present

## 2021-09-18 DIAGNOSIS — M6281 Muscle weakness (generalized): Secondary | ICD-10-CM | POA: Diagnosis not present

## 2021-09-20 DIAGNOSIS — I69320 Aphasia following cerebral infarction: Secondary | ICD-10-CM | POA: Diagnosis not present

## 2021-09-20 DIAGNOSIS — I63412 Cerebral infarction due to embolism of left middle cerebral artery: Secondary | ICD-10-CM | POA: Diagnosis not present

## 2021-09-20 DIAGNOSIS — I513 Intracardiac thrombosis, not elsewhere classified: Secondary | ICD-10-CM | POA: Diagnosis not present

## 2021-09-20 DIAGNOSIS — I69321 Dysphasia following cerebral infarction: Secondary | ICD-10-CM | POA: Diagnosis not present

## 2021-09-20 DIAGNOSIS — M6281 Muscle weakness (generalized): Secondary | ICD-10-CM | POA: Diagnosis not present

## 2021-09-20 DIAGNOSIS — N179 Acute kidney failure, unspecified: Secondary | ICD-10-CM | POA: Diagnosis not present

## 2021-09-20 DIAGNOSIS — I69351 Hemiplegia and hemiparesis following cerebral infarction affecting right dominant side: Secondary | ICD-10-CM | POA: Diagnosis not present

## 2021-09-20 DIAGNOSIS — I5042 Chronic combined systolic (congestive) and diastolic (congestive) heart failure: Secondary | ICD-10-CM | POA: Diagnosis not present

## 2021-09-21 DIAGNOSIS — I69321 Dysphasia following cerebral infarction: Secondary | ICD-10-CM | POA: Diagnosis not present

## 2021-09-21 DIAGNOSIS — N179 Acute kidney failure, unspecified: Secondary | ICD-10-CM | POA: Diagnosis not present

## 2021-09-21 DIAGNOSIS — I69320 Aphasia following cerebral infarction: Secondary | ICD-10-CM | POA: Diagnosis not present

## 2021-09-21 DIAGNOSIS — M6281 Muscle weakness (generalized): Secondary | ICD-10-CM | POA: Diagnosis not present

## 2021-09-21 DIAGNOSIS — I5042 Chronic combined systolic (congestive) and diastolic (congestive) heart failure: Secondary | ICD-10-CM | POA: Diagnosis not present

## 2021-09-21 DIAGNOSIS — I69351 Hemiplegia and hemiparesis following cerebral infarction affecting right dominant side: Secondary | ICD-10-CM | POA: Diagnosis not present

## 2021-09-21 DIAGNOSIS — I513 Intracardiac thrombosis, not elsewhere classified: Secondary | ICD-10-CM | POA: Diagnosis not present

## 2021-09-21 DIAGNOSIS — I63412 Cerebral infarction due to embolism of left middle cerebral artery: Secondary | ICD-10-CM | POA: Diagnosis not present

## 2021-09-23 DIAGNOSIS — N179 Acute kidney failure, unspecified: Secondary | ICD-10-CM | POA: Diagnosis not present

## 2021-09-23 DIAGNOSIS — I513 Intracardiac thrombosis, not elsewhere classified: Secondary | ICD-10-CM | POA: Diagnosis not present

## 2021-09-23 DIAGNOSIS — I69320 Aphasia following cerebral infarction: Secondary | ICD-10-CM | POA: Diagnosis not present

## 2021-09-23 DIAGNOSIS — I5042 Chronic combined systolic (congestive) and diastolic (congestive) heart failure: Secondary | ICD-10-CM | POA: Diagnosis not present

## 2021-09-23 DIAGNOSIS — I63412 Cerebral infarction due to embolism of left middle cerebral artery: Secondary | ICD-10-CM | POA: Diagnosis not present

## 2021-09-23 DIAGNOSIS — I69321 Dysphasia following cerebral infarction: Secondary | ICD-10-CM | POA: Diagnosis not present

## 2021-09-23 DIAGNOSIS — I69351 Hemiplegia and hemiparesis following cerebral infarction affecting right dominant side: Secondary | ICD-10-CM | POA: Diagnosis not present

## 2021-09-23 DIAGNOSIS — M6281 Muscle weakness (generalized): Secondary | ICD-10-CM | POA: Diagnosis not present

## 2021-09-24 DIAGNOSIS — I69320 Aphasia following cerebral infarction: Secondary | ICD-10-CM | POA: Diagnosis not present

## 2021-09-24 DIAGNOSIS — I5042 Chronic combined systolic (congestive) and diastolic (congestive) heart failure: Secondary | ICD-10-CM | POA: Diagnosis not present

## 2021-09-24 DIAGNOSIS — I69351 Hemiplegia and hemiparesis following cerebral infarction affecting right dominant side: Secondary | ICD-10-CM | POA: Diagnosis not present

## 2021-09-24 DIAGNOSIS — I513 Intracardiac thrombosis, not elsewhere classified: Secondary | ICD-10-CM | POA: Diagnosis not present

## 2021-09-24 DIAGNOSIS — I63412 Cerebral infarction due to embolism of left middle cerebral artery: Secondary | ICD-10-CM | POA: Diagnosis not present

## 2021-09-24 DIAGNOSIS — I69321 Dysphasia following cerebral infarction: Secondary | ICD-10-CM | POA: Diagnosis not present

## 2021-09-24 DIAGNOSIS — N179 Acute kidney failure, unspecified: Secondary | ICD-10-CM | POA: Diagnosis not present

## 2021-09-24 DIAGNOSIS — M6281 Muscle weakness (generalized): Secondary | ICD-10-CM | POA: Diagnosis not present

## 2021-09-25 DIAGNOSIS — N179 Acute kidney failure, unspecified: Secondary | ICD-10-CM | POA: Diagnosis not present

## 2021-09-25 DIAGNOSIS — I513 Intracardiac thrombosis, not elsewhere classified: Secondary | ICD-10-CM | POA: Diagnosis not present

## 2021-09-25 DIAGNOSIS — I69321 Dysphasia following cerebral infarction: Secondary | ICD-10-CM | POA: Diagnosis not present

## 2021-09-25 DIAGNOSIS — M6281 Muscle weakness (generalized): Secondary | ICD-10-CM | POA: Diagnosis not present

## 2021-09-25 DIAGNOSIS — I69351 Hemiplegia and hemiparesis following cerebral infarction affecting right dominant side: Secondary | ICD-10-CM | POA: Diagnosis not present

## 2021-09-25 DIAGNOSIS — I63412 Cerebral infarction due to embolism of left middle cerebral artery: Secondary | ICD-10-CM | POA: Diagnosis not present

## 2021-09-25 DIAGNOSIS — I69320 Aphasia following cerebral infarction: Secondary | ICD-10-CM | POA: Diagnosis not present

## 2021-09-25 DIAGNOSIS — I5042 Chronic combined systolic (congestive) and diastolic (congestive) heart failure: Secondary | ICD-10-CM | POA: Diagnosis not present

## 2021-09-26 DIAGNOSIS — I69321 Dysphasia following cerebral infarction: Secondary | ICD-10-CM | POA: Diagnosis not present

## 2021-09-26 DIAGNOSIS — I5042 Chronic combined systolic (congestive) and diastolic (congestive) heart failure: Secondary | ICD-10-CM | POA: Diagnosis not present

## 2021-09-26 DIAGNOSIS — M6281 Muscle weakness (generalized): Secondary | ICD-10-CM | POA: Diagnosis not present

## 2021-09-26 DIAGNOSIS — I513 Intracardiac thrombosis, not elsewhere classified: Secondary | ICD-10-CM | POA: Diagnosis not present

## 2021-09-26 DIAGNOSIS — I63412 Cerebral infarction due to embolism of left middle cerebral artery: Secondary | ICD-10-CM | POA: Diagnosis not present

## 2021-09-26 DIAGNOSIS — I69320 Aphasia following cerebral infarction: Secondary | ICD-10-CM | POA: Diagnosis not present

## 2021-09-26 DIAGNOSIS — I69351 Hemiplegia and hemiparesis following cerebral infarction affecting right dominant side: Secondary | ICD-10-CM | POA: Diagnosis not present

## 2021-09-26 DIAGNOSIS — N179 Acute kidney failure, unspecified: Secondary | ICD-10-CM | POA: Diagnosis not present

## 2021-09-29 DIAGNOSIS — I5042 Chronic combined systolic (congestive) and diastolic (congestive) heart failure: Secondary | ICD-10-CM | POA: Diagnosis not present

## 2021-09-29 DIAGNOSIS — I513 Intracardiac thrombosis, not elsewhere classified: Secondary | ICD-10-CM | POA: Diagnosis not present

## 2021-09-29 DIAGNOSIS — M6281 Muscle weakness (generalized): Secondary | ICD-10-CM | POA: Diagnosis not present

## 2021-09-29 DIAGNOSIS — I63412 Cerebral infarction due to embolism of left middle cerebral artery: Secondary | ICD-10-CM | POA: Diagnosis not present

## 2021-09-29 DIAGNOSIS — I69321 Dysphasia following cerebral infarction: Secondary | ICD-10-CM | POA: Diagnosis not present

## 2021-09-29 DIAGNOSIS — I69351 Hemiplegia and hemiparesis following cerebral infarction affecting right dominant side: Secondary | ICD-10-CM | POA: Diagnosis not present

## 2021-09-29 DIAGNOSIS — N179 Acute kidney failure, unspecified: Secondary | ICD-10-CM | POA: Diagnosis not present

## 2021-09-29 DIAGNOSIS — I69320 Aphasia following cerebral infarction: Secondary | ICD-10-CM | POA: Diagnosis not present

## 2021-09-30 DIAGNOSIS — I5042 Chronic combined systolic (congestive) and diastolic (congestive) heart failure: Secondary | ICD-10-CM | POA: Diagnosis not present

## 2021-09-30 DIAGNOSIS — I69351 Hemiplegia and hemiparesis following cerebral infarction affecting right dominant side: Secondary | ICD-10-CM | POA: Diagnosis not present

## 2021-09-30 DIAGNOSIS — I513 Intracardiac thrombosis, not elsewhere classified: Secondary | ICD-10-CM | POA: Diagnosis not present

## 2021-09-30 DIAGNOSIS — I69321 Dysphasia following cerebral infarction: Secondary | ICD-10-CM | POA: Diagnosis not present

## 2021-09-30 DIAGNOSIS — I69320 Aphasia following cerebral infarction: Secondary | ICD-10-CM | POA: Diagnosis not present

## 2021-09-30 DIAGNOSIS — M6281 Muscle weakness (generalized): Secondary | ICD-10-CM | POA: Diagnosis not present

## 2021-09-30 DIAGNOSIS — I63412 Cerebral infarction due to embolism of left middle cerebral artery: Secondary | ICD-10-CM | POA: Diagnosis not present

## 2021-09-30 DIAGNOSIS — N179 Acute kidney failure, unspecified: Secondary | ICD-10-CM | POA: Diagnosis not present

## 2021-10-02 DIAGNOSIS — I63412 Cerebral infarction due to embolism of left middle cerebral artery: Secondary | ICD-10-CM | POA: Diagnosis not present

## 2021-10-02 DIAGNOSIS — I69321 Dysphasia following cerebral infarction: Secondary | ICD-10-CM | POA: Diagnosis not present

## 2021-10-02 DIAGNOSIS — I69320 Aphasia following cerebral infarction: Secondary | ICD-10-CM | POA: Diagnosis not present

## 2021-10-02 DIAGNOSIS — M6281 Muscle weakness (generalized): Secondary | ICD-10-CM | POA: Diagnosis not present

## 2021-10-02 DIAGNOSIS — N179 Acute kidney failure, unspecified: Secondary | ICD-10-CM | POA: Diagnosis not present

## 2021-10-02 DIAGNOSIS — I5042 Chronic combined systolic (congestive) and diastolic (congestive) heart failure: Secondary | ICD-10-CM | POA: Diagnosis not present

## 2021-10-02 DIAGNOSIS — I513 Intracardiac thrombosis, not elsewhere classified: Secondary | ICD-10-CM | POA: Diagnosis not present

## 2021-10-02 DIAGNOSIS — I69351 Hemiplegia and hemiparesis following cerebral infarction affecting right dominant side: Secondary | ICD-10-CM | POA: Diagnosis not present

## 2021-10-04 DIAGNOSIS — M6281 Muscle weakness (generalized): Secondary | ICD-10-CM | POA: Diagnosis not present

## 2021-10-04 DIAGNOSIS — I69351 Hemiplegia and hemiparesis following cerebral infarction affecting right dominant side: Secondary | ICD-10-CM | POA: Diagnosis not present

## 2021-10-04 DIAGNOSIS — I63412 Cerebral infarction due to embolism of left middle cerebral artery: Secondary | ICD-10-CM | POA: Diagnosis not present

## 2021-10-04 DIAGNOSIS — I5042 Chronic combined systolic (congestive) and diastolic (congestive) heart failure: Secondary | ICD-10-CM | POA: Diagnosis not present

## 2021-10-04 DIAGNOSIS — I69320 Aphasia following cerebral infarction: Secondary | ICD-10-CM | POA: Diagnosis not present

## 2021-10-04 DIAGNOSIS — I513 Intracardiac thrombosis, not elsewhere classified: Secondary | ICD-10-CM | POA: Diagnosis not present

## 2021-10-04 DIAGNOSIS — N179 Acute kidney failure, unspecified: Secondary | ICD-10-CM | POA: Diagnosis not present

## 2021-10-04 DIAGNOSIS — I69321 Dysphasia following cerebral infarction: Secondary | ICD-10-CM | POA: Diagnosis not present

## 2021-10-05 DIAGNOSIS — N179 Acute kidney failure, unspecified: Secondary | ICD-10-CM | POA: Diagnosis not present

## 2021-10-05 DIAGNOSIS — I513 Intracardiac thrombosis, not elsewhere classified: Secondary | ICD-10-CM | POA: Diagnosis not present

## 2021-10-05 DIAGNOSIS — I69321 Dysphasia following cerebral infarction: Secondary | ICD-10-CM | POA: Diagnosis not present

## 2021-10-05 DIAGNOSIS — M6281 Muscle weakness (generalized): Secondary | ICD-10-CM | POA: Diagnosis not present

## 2021-10-05 DIAGNOSIS — I69320 Aphasia following cerebral infarction: Secondary | ICD-10-CM | POA: Diagnosis not present

## 2021-10-05 DIAGNOSIS — I63412 Cerebral infarction due to embolism of left middle cerebral artery: Secondary | ICD-10-CM | POA: Diagnosis not present

## 2021-10-05 DIAGNOSIS — I69351 Hemiplegia and hemiparesis following cerebral infarction affecting right dominant side: Secondary | ICD-10-CM | POA: Diagnosis not present

## 2021-10-05 DIAGNOSIS — I5042 Chronic combined systolic (congestive) and diastolic (congestive) heart failure: Secondary | ICD-10-CM | POA: Diagnosis not present

## 2021-10-07 DIAGNOSIS — R419 Unspecified symptoms and signs involving cognitive functions and awareness: Secondary | ICD-10-CM | POA: Diagnosis not present

## 2021-10-07 DIAGNOSIS — I69351 Hemiplegia and hemiparesis following cerebral infarction affecting right dominant side: Secondary | ICD-10-CM | POA: Diagnosis not present

## 2021-10-07 DIAGNOSIS — I5042 Chronic combined systolic (congestive) and diastolic (congestive) heart failure: Secondary | ICD-10-CM | POA: Diagnosis not present

## 2021-10-07 DIAGNOSIS — N179 Acute kidney failure, unspecified: Secondary | ICD-10-CM | POA: Diagnosis not present

## 2021-10-07 DIAGNOSIS — I69321 Dysphasia following cerebral infarction: Secondary | ICD-10-CM | POA: Diagnosis not present

## 2021-10-07 DIAGNOSIS — M6281 Muscle weakness (generalized): Secondary | ICD-10-CM | POA: Diagnosis not present

## 2021-10-07 DIAGNOSIS — I513 Intracardiac thrombosis, not elsewhere classified: Secondary | ICD-10-CM | POA: Diagnosis not present

## 2021-10-07 DIAGNOSIS — I69320 Aphasia following cerebral infarction: Secondary | ICD-10-CM | POA: Diagnosis not present

## 2021-10-07 DIAGNOSIS — F331 Major depressive disorder, recurrent, moderate: Secondary | ICD-10-CM | POA: Diagnosis not present

## 2021-10-07 DIAGNOSIS — I63412 Cerebral infarction due to embolism of left middle cerebral artery: Secondary | ICD-10-CM | POA: Diagnosis not present

## 2021-10-08 DIAGNOSIS — I69320 Aphasia following cerebral infarction: Secondary | ICD-10-CM | POA: Diagnosis not present

## 2021-10-08 DIAGNOSIS — I69351 Hemiplegia and hemiparesis following cerebral infarction affecting right dominant side: Secondary | ICD-10-CM | POA: Diagnosis not present

## 2021-10-08 DIAGNOSIS — I513 Intracardiac thrombosis, not elsewhere classified: Secondary | ICD-10-CM | POA: Diagnosis not present

## 2021-10-08 DIAGNOSIS — N179 Acute kidney failure, unspecified: Secondary | ICD-10-CM | POA: Diagnosis not present

## 2021-10-08 DIAGNOSIS — M6281 Muscle weakness (generalized): Secondary | ICD-10-CM | POA: Diagnosis not present

## 2021-10-08 DIAGNOSIS — I69321 Dysphasia following cerebral infarction: Secondary | ICD-10-CM | POA: Diagnosis not present

## 2021-10-08 DIAGNOSIS — I5042 Chronic combined systolic (congestive) and diastolic (congestive) heart failure: Secondary | ICD-10-CM | POA: Diagnosis not present

## 2021-10-08 DIAGNOSIS — I63412 Cerebral infarction due to embolism of left middle cerebral artery: Secondary | ICD-10-CM | POA: Diagnosis not present

## 2021-10-10 ENCOUNTER — Ambulatory Visit (HOSPITAL_COMMUNITY)
Admission: RE | Admit: 2021-10-10 | Discharge: 2021-10-10 | Disposition: A | Discharge status: Home / Self Care | Payer: Medicare HMO | Source: Ambulatory Visit | Attending: Internal Medicine | Admitting: Internal Medicine

## 2021-10-10 ENCOUNTER — Encounter (HOSPITAL_COMMUNITY): Payer: Self-pay

## 2021-10-10 DIAGNOSIS — R1310 Dysphagia, unspecified: Secondary | ICD-10-CM

## 2021-10-11 DIAGNOSIS — M6281 Muscle weakness (generalized): Secondary | ICD-10-CM | POA: Diagnosis not present

## 2021-10-11 DIAGNOSIS — I63412 Cerebral infarction due to embolism of left middle cerebral artery: Secondary | ICD-10-CM | POA: Diagnosis not present

## 2021-10-11 DIAGNOSIS — I69320 Aphasia following cerebral infarction: Secondary | ICD-10-CM | POA: Diagnosis not present

## 2021-10-11 DIAGNOSIS — N179 Acute kidney failure, unspecified: Secondary | ICD-10-CM | POA: Diagnosis not present

## 2021-10-11 DIAGNOSIS — I513 Intracardiac thrombosis, not elsewhere classified: Secondary | ICD-10-CM | POA: Diagnosis not present

## 2021-10-11 DIAGNOSIS — I69321 Dysphasia following cerebral infarction: Secondary | ICD-10-CM | POA: Diagnosis not present

## 2021-10-11 DIAGNOSIS — I5042 Chronic combined systolic (congestive) and diastolic (congestive) heart failure: Secondary | ICD-10-CM | POA: Diagnosis not present

## 2021-10-11 DIAGNOSIS — I69351 Hemiplegia and hemiparesis following cerebral infarction affecting right dominant side: Secondary | ICD-10-CM | POA: Diagnosis not present

## 2021-10-12 DIAGNOSIS — I5042 Chronic combined systolic (congestive) and diastolic (congestive) heart failure: Secondary | ICD-10-CM | POA: Diagnosis not present

## 2021-10-12 DIAGNOSIS — I63412 Cerebral infarction due to embolism of left middle cerebral artery: Secondary | ICD-10-CM | POA: Diagnosis not present

## 2021-10-12 DIAGNOSIS — I69351 Hemiplegia and hemiparesis following cerebral infarction affecting right dominant side: Secondary | ICD-10-CM | POA: Diagnosis not present

## 2021-10-12 DIAGNOSIS — I69321 Dysphasia following cerebral infarction: Secondary | ICD-10-CM | POA: Diagnosis not present

## 2021-10-12 DIAGNOSIS — I69320 Aphasia following cerebral infarction: Secondary | ICD-10-CM | POA: Diagnosis not present

## 2021-10-12 DIAGNOSIS — I513 Intracardiac thrombosis, not elsewhere classified: Secondary | ICD-10-CM | POA: Diagnosis not present

## 2021-10-12 DIAGNOSIS — M6281 Muscle weakness (generalized): Secondary | ICD-10-CM | POA: Diagnosis not present

## 2021-10-12 DIAGNOSIS — N179 Acute kidney failure, unspecified: Secondary | ICD-10-CM | POA: Diagnosis not present

## 2021-10-14 DIAGNOSIS — I69320 Aphasia following cerebral infarction: Secondary | ICD-10-CM | POA: Diagnosis not present

## 2021-10-14 DIAGNOSIS — N179 Acute kidney failure, unspecified: Secondary | ICD-10-CM | POA: Diagnosis not present

## 2021-10-14 DIAGNOSIS — I69351 Hemiplegia and hemiparesis following cerebral infarction affecting right dominant side: Secondary | ICD-10-CM | POA: Diagnosis not present

## 2021-10-14 DIAGNOSIS — M6281 Muscle weakness (generalized): Secondary | ICD-10-CM | POA: Diagnosis not present

## 2021-10-14 DIAGNOSIS — I69321 Dysphasia following cerebral infarction: Secondary | ICD-10-CM | POA: Diagnosis not present

## 2021-10-14 DIAGNOSIS — I5042 Chronic combined systolic (congestive) and diastolic (congestive) heart failure: Secondary | ICD-10-CM | POA: Diagnosis not present

## 2021-10-14 DIAGNOSIS — I513 Intracardiac thrombosis, not elsewhere classified: Secondary | ICD-10-CM | POA: Diagnosis not present

## 2021-10-14 DIAGNOSIS — I63412 Cerebral infarction due to embolism of left middle cerebral artery: Secondary | ICD-10-CM | POA: Diagnosis not present

## 2021-10-16 ENCOUNTER — Encounter (HOSPITAL_COMMUNITY): Payer: Self-pay

## 2021-10-16 ENCOUNTER — Other Ambulatory Visit: Payer: Self-pay

## 2021-10-16 ENCOUNTER — Emergency Department (HOSPITAL_COMMUNITY): Payer: Medicare HMO

## 2021-10-16 ENCOUNTER — Emergency Department (HOSPITAL_COMMUNITY)
Admission: EM | Admit: 2021-10-16 | Discharge: 2021-10-17 | Disposition: A | Discharge status: Skilled Nursing Facility | Payer: Medicare HMO | Attending: Emergency Medicine | Admitting: Emergency Medicine

## 2021-10-16 DIAGNOSIS — W19XXXA Unspecified fall, initial encounter: Secondary | ICD-10-CM

## 2021-10-16 DIAGNOSIS — S0083XA Contusion of other part of head, initial encounter: Secondary | ICD-10-CM

## 2021-10-16 DIAGNOSIS — I5042 Chronic combined systolic (congestive) and diastolic (congestive) heart failure: Secondary | ICD-10-CM | POA: Diagnosis not present

## 2021-10-16 DIAGNOSIS — R296 Repeated falls: Secondary | ICD-10-CM | POA: Diagnosis not present

## 2021-10-16 DIAGNOSIS — N179 Acute kidney failure, unspecified: Secondary | ICD-10-CM | POA: Diagnosis not present

## 2021-10-16 DIAGNOSIS — R9431 Abnormal electrocardiogram [ECG] [EKG]: Secondary | ICD-10-CM | POA: Diagnosis not present

## 2021-10-16 DIAGNOSIS — T148XXA Other injury of unspecified body region, initial encounter: Secondary | ICD-10-CM | POA: Diagnosis not present

## 2021-10-16 DIAGNOSIS — I63412 Cerebral infarction due to embolism of left middle cerebral artery: Secondary | ICD-10-CM | POA: Diagnosis not present

## 2021-10-16 DIAGNOSIS — R531 Weakness: Secondary | ICD-10-CM | POA: Insufficient documentation

## 2021-10-16 DIAGNOSIS — S0990XA Unspecified injury of head, initial encounter: Secondary | ICD-10-CM | POA: Diagnosis not present

## 2021-10-16 DIAGNOSIS — I513 Intracardiac thrombosis, not elsewhere classified: Secondary | ICD-10-CM | POA: Diagnosis not present

## 2021-10-16 DIAGNOSIS — I671 Cerebral aneurysm, nonruptured: Secondary | ICD-10-CM | POA: Diagnosis not present

## 2021-10-16 DIAGNOSIS — I69351 Hemiplegia and hemiparesis following cerebral infarction affecting right dominant side: Secondary | ICD-10-CM | POA: Diagnosis not present

## 2021-10-16 DIAGNOSIS — Z043 Encounter for examination and observation following other accident: Secondary | ICD-10-CM | POA: Diagnosis not present

## 2021-10-16 DIAGNOSIS — Z7901 Long term (current) use of anticoagulants: Secondary | ICD-10-CM | POA: Diagnosis not present

## 2021-10-16 DIAGNOSIS — M6281 Muscle weakness (generalized): Secondary | ICD-10-CM | POA: Diagnosis not present

## 2021-10-16 DIAGNOSIS — R9082 White matter disease, unspecified: Secondary | ICD-10-CM | POA: Diagnosis not present

## 2021-10-16 DIAGNOSIS — R404 Transient alteration of awareness: Secondary | ICD-10-CM | POA: Diagnosis not present

## 2021-10-16 DIAGNOSIS — S0993XA Unspecified injury of face, initial encounter: Secondary | ICD-10-CM | POA: Diagnosis present

## 2021-10-16 DIAGNOSIS — I69320 Aphasia following cerebral infarction: Secondary | ICD-10-CM | POA: Diagnosis not present

## 2021-10-16 DIAGNOSIS — I69321 Dysphasia following cerebral infarction: Secondary | ICD-10-CM | POA: Diagnosis not present

## 2021-10-16 LAB — BASIC METABOLIC PANEL
Anion gap: 10 (ref 5–15)
BUN: 14 mg/dL (ref 8–23)
CO2: 24 mmol/L (ref 22–32)
Calcium: 9 mg/dL (ref 8.9–10.3)
Chloride: 108 mmol/L (ref 98–111)
Creatinine, Ser: 0.9 mg/dL (ref 0.44–1.00)
GFR, Estimated: >60 mL/min (ref >60)
Glucose, Bld: 77 mg/dL (ref 70–99)
Potassium: 4.5 mmol/L (ref 3.5–5.1)
Sodium: 142 mmol/L (ref 135–145)

## 2021-10-16 LAB — FIBRINOGEN: Fibrinogen: 377 mg/dL (ref 210–475)

## 2021-10-16 LAB — CBC
HCT: 44.4 % (ref 36.0–46.0)
Hemoglobin: 13.9 g/dL (ref 12.0–15.0)
MCH: 28.8 pg (ref 26.0–34.0)
MCHC: 31.3 g/dL (ref 30.0–36.0)
MCV: 91.9 fL (ref 80.0–100.0)
Platelets: 160 10*3/uL (ref 150–400)
RBC: 4.83 MIL/uL (ref 3.87–5.11)
RDW: 13.7 % (ref 11.5–15.5)
WBC: 6.9 10*3/uL (ref 4.0–10.5)
nRBC: 0 % (ref 0.0–0.2)

## 2021-10-16 LAB — HEPATIC FUNCTION PANEL
ALT: 31 U/L (ref 0–44)
AST: 29 U/L (ref 15–41)
Albumin: 3.4 g/dL — ABNORMAL LOW (ref 3.5–5.0)
Alkaline Phosphatase: 65 U/L (ref 38–126)
Bilirubin, Direct: 0.1 mg/dL (ref 0.0–0.2)
Indirect Bilirubin: 0.7 mg/dL (ref 0.3–0.9)
Total Bilirubin: 0.8 mg/dL (ref 0.3–1.2)
Total Protein: 7.3 g/dL (ref 6.5–8.1)

## 2021-10-16 LAB — PROTIME-INR
INR: 1.2 (ref 0.8–1.2)
Prothrombin Time: 15.6 seconds — ABNORMAL HIGH (ref 11.4–15.2)

## 2021-10-16 LAB — APTT: aPTT: 34 seconds (ref 24–36)

## 2021-10-16 NOTE — ED Notes (Signed)
Trauma Response Nurse Note-  Reason for Call / Reason for Trauma activation:   - L2 fall on thinners  Initial Focused Assessment (If applicable, or please see trauma documentation):  - GCS 13-14 (aphasic) - HR a fib (on eliquis) - Pt slightly contracted  - FC - Pupils 2 equal brisk - Hematoma to R forehead  Interventions:  - 20G started to L wrist - labs done - CXR - CT head (negative)  Plan of Care as of this note:  - Awaiting results of scans - Probable d/c back to SNF  Event Summary:   - Pt from SNF and had a fall striking the R forehead.  Hematoma and small scratch to forehead.  Pt has a hx of previous stroke and has aphasia making it difficult to communicate with patient.  Pt denies pain and follows commands in all extremities although weak all throughout.  TRN [employer] [phone]

## 2021-10-16 NOTE — ED Triage Notes (Signed)
Pt BIB [facility] from [facility] for Level II fall on thinners. Unwitnessed fall. Pt takes eliquis. Hx stroke with expressive aphasia. Denies neck/back/hip pain. Hematoma to R side of forehead with soreness to area. Able to follow commands.   EMS VS- 106 palpated BP, HR 62, RR 14, CBG 117

## 2021-10-16 NOTE — Discharge Instructions (Addendum)
Mindy Mills's imaging report and labs are included.  Were no significant traumatic findings on her exam today.  She has a small hematoma on the forehead which can be treated with ice packs as needed and some compression.  I did attempt to reach her daughter by phone to update her on the ED workup, but was not able to.  * CT Head Report  TECHNIQUE: Contiguous axial images were obtained from the base of the skull through the vertex without intravenous contrast.  COMPARISON: Head CT 06/30/2021  FINDINGS: Brain: Stable aneurysm clip near the circle-of-Willis. Stable large area encephalomalacia in the left frontoparietal area consistent with remote infarct.  No evidence of acute infarction or intracranial hemorrhage. No extra-axial fluid collections are identified. Stable chronic white matter disease.  The brainstem and cerebellum are grossly normal in stable.  Vascular: Stable vascular calcifications. No hyperdense vessels.  Skull: No skull fracture or bone lesions.  Sinuses/Orbits: The paranasal sinuses and mastoid air cells are clear. The globes are intact.  Other: No scalp lesions or scalp hematoma.  IMPRESSION: 1. Stable large area of encephalomalacia in the left frontoparietal area consistent with remote infarct. 2. No acute intracranial findings or skull fracture.   Electronically Signed By: Marijo Sanes M.D. On: 10/16/2021 13:53

## 2021-10-16 NOTE — ED Notes (Signed)
Pt transported to CT ?

## 2021-10-16 NOTE — ED Provider Notes (Signed)
[facility] EMERGENCY DEPARTMENT Provider Note   CSN: [id number] Arrival date & time: 10/16/21  1244     History Chief Complaint  Patient presents with   Fall    Level II    Mindy Mills is a 70 y.o. female presenting from [facility] facility with unwitnessed fall.  Patient found down with a small hematoma on her forehead.  She has baseline weakness and deficits from prior strokes.  She is a poor historian.  She denies any active headache to me.  She is on Eliquis per nursing home report and per her medication list.  She presents with signed DNR/DNI form at bedside  HPI     No past medical history on file.  There are no problems to display for this patient.    OB History   No obstetric history on file.     No family history on file.     Home Medications Prior to Admission medications   Not on File    Allergies    Patient has no allergy information on record.  Review of Systems   Review of Systems  Unable to perform ROS: Dementia (level 5 caveat)   Physical Exam Updated Vital Signs BP (!) 127/101   Pulse (!) 50   Temp (!) 96.5 F (35.8 C) (Axillary)   Resp 10   Ht 5\' 1"  (1.549 m)   Wt 52 kg   SpO2 97%   BMI 21.66 kg/m   Physical Exam Constitutional:      General: She is not in acute distress.    Comments: Thin, frail appearing  HENT:     Head: Normocephalic.     Comments: Small forehead hematoma Eyes:     Conjunctiva/sclera: Conjunctivae normal.     Pupils: Pupils are equal, round, and reactive to light.  Cardiovascular:     Rate and Rhythm: Normal rate and regular rhythm.  Pulmonary:     Effort: Pulmonary effort is normal. No respiratory distress.  Abdominal:     General: There is no distension.     Tenderness: There is no abdominal tenderness.  Skin:    General: Skin is warm and dry.  Neurological:     General: No focal deficit present.     Mental Status: She is alert. Mental status is at baseline.      Comments: No spinal midline tenderness    ED Results / Procedures / Treatments   Labs (all labs ordered are listed, but only abnormal results are displayed) Labs Reviewed  HEPATIC FUNCTION PANEL - Abnormal; Notable for the following components:      Result Value   Albumin 3.4 (*)    All other components within normal limits  PROTIME-INR - Abnormal; Notable for the following components:   Prothrombin Time 15.6 (*)    All other components within normal limits  BASIC METABOLIC PANEL  CBC  FIBRINOGEN  APTT    EKG EKG Interpretation  Date/Time:  Thursday October 16 2021 12:57:01 EDT Ventricular Rate:  50 PR Interval:  166 QRS Duration: 126 QT Interval:  519 QTC Calculation: 474 R Axis:   86 Text Interpretation: Sinus rhythm Right bundle branch block Confirmed by Octaviano Glow [phone]) on 10/16/2021 1:11:15 PM  Radiology DG Chest Portable 1 View  Result Date: 10/16/2021 CLINICAL DATA:  Fall, unresponsive episode. Evaluate for aspiration. EXAM: PORTABLE CHEST 1 VIEW COMPARISON:  02/06/2021 FINDINGS: Patient is rotated towards the right on this AP portable view. Left lung is clear.  Limited evaluation of the right lung base due to patient positioning. Heart size appears to be within normal limits. IMPRESSION: No acute chest finding but limited evaluation of the right lower lung due to patient positioning. Electronically Signed   By: Markus Daft M.D.   On: 10/16/2021 13:31    Procedures Procedures   Medications Ordered in ED Medications - No data to display  ED Course  I have reviewed the triage vital signs and the nursing notes.  Pertinent labs & imaging results that were available during my care of the patient were reviewed by me and considered in my medical decision making (see chart for details).  Patient is here with an unwitnessed fall from her facility.  Small hematoma on the forehead.  CT scan of the brain does not show acute traumatic intracranial injury or skull  fracture.  She has large prior stroke, chronic infarct.  She is frail-appearing, clinically per her labs and blood test does not appear to be dehydrated.  No AKI.  Albumin very mildly low.  Otherwise BMP and CBC within normal limits.  No evidence of infection on UA.  I do believe she is reasonably safe for discharge.  Chest x-ray with low lung volumes per interpretation but no obvious focal infiltrate, she has no hypoxia or respiratory complaints to suggest an aspiration event.  Clinical Course as of 10/16/21 1451  Thu Oct 16, 2021  1448 Patient's labs are otherwise unremarkable.  No acute traumatic injuries and no acute CVA per my interpretation of brain scan.  Labs not show evidence of significant dehydration.  She is reasonably safe for discharge.  I did attempt to contact her daughter by phone, but was bounced to [employer] [MT]    Clinical Course User Index [MT] Choua Ikner, Carola Rhine, MD    Final Clinical Impression(s) / ED Diagnoses Final diagnoses:  Fall, initial encounter  Traumatic hematoma of forehead, initial encounter    Rx / DC Orders ED Discharge Orders     None        Wyvonnia Dusky, MD 10/16/21 1451

## 2021-10-17 ENCOUNTER — Other Ambulatory Visit (HOSPITAL_COMMUNITY): Payer: Self-pay | Admitting: *Deleted

## 2021-10-17 DIAGNOSIS — R1310 Dysphagia, unspecified: Secondary | ICD-10-CM

## 2021-10-17 DIAGNOSIS — W19XXXA Unspecified fall, initial encounter: Secondary | ICD-10-CM | POA: Diagnosis not present

## 2021-10-17 DIAGNOSIS — I69321 Dysphasia following cerebral infarction: Secondary | ICD-10-CM | POA: Diagnosis not present

## 2021-10-17 DIAGNOSIS — N179 Acute kidney failure, unspecified: Secondary | ICD-10-CM | POA: Diagnosis not present

## 2021-10-17 DIAGNOSIS — M6281 Muscle weakness (generalized): Secondary | ICD-10-CM | POA: Diagnosis not present

## 2021-10-17 DIAGNOSIS — Z7401 Bed confinement status: Secondary | ICD-10-CM | POA: Diagnosis not present

## 2021-10-17 DIAGNOSIS — I69351 Hemiplegia and hemiparesis following cerebral infarction affecting right dominant side: Secondary | ICD-10-CM | POA: Diagnosis not present

## 2021-10-17 DIAGNOSIS — R531 Weakness: Secondary | ICD-10-CM | POA: Diagnosis not present

## 2021-10-17 DIAGNOSIS — I5042 Chronic combined systolic (congestive) and diastolic (congestive) heart failure: Secondary | ICD-10-CM | POA: Diagnosis not present

## 2021-10-17 DIAGNOSIS — I69320 Aphasia following cerebral infarction: Secondary | ICD-10-CM | POA: Diagnosis not present

## 2021-10-17 DIAGNOSIS — I513 Intracardiac thrombosis, not elsewhere classified: Secondary | ICD-10-CM | POA: Diagnosis not present

## 2021-10-17 DIAGNOSIS — I63412 Cerebral infarction due to embolism of left middle cerebral artery: Secondary | ICD-10-CM | POA: Diagnosis not present

## 2021-10-18 DIAGNOSIS — I69320 Aphasia following cerebral infarction: Secondary | ICD-10-CM | POA: Diagnosis not present

## 2021-10-18 DIAGNOSIS — N179 Acute kidney failure, unspecified: Secondary | ICD-10-CM | POA: Diagnosis not present

## 2021-10-18 DIAGNOSIS — I5042 Chronic combined systolic (congestive) and diastolic (congestive) heart failure: Secondary | ICD-10-CM | POA: Diagnosis not present

## 2021-10-18 DIAGNOSIS — I513 Intracardiac thrombosis, not elsewhere classified: Secondary | ICD-10-CM | POA: Diagnosis not present

## 2021-10-18 DIAGNOSIS — I69351 Hemiplegia and hemiparesis following cerebral infarction affecting right dominant side: Secondary | ICD-10-CM | POA: Diagnosis not present

## 2021-10-18 DIAGNOSIS — I69321 Dysphasia following cerebral infarction: Secondary | ICD-10-CM | POA: Diagnosis not present

## 2021-10-18 DIAGNOSIS — I63412 Cerebral infarction due to embolism of left middle cerebral artery: Secondary | ICD-10-CM | POA: Diagnosis not present

## 2021-10-18 DIAGNOSIS — M6281 Muscle weakness (generalized): Secondary | ICD-10-CM | POA: Diagnosis not present

## 2021-10-20 DIAGNOSIS — M6281 Muscle weakness (generalized): Secondary | ICD-10-CM | POA: Diagnosis not present

## 2021-10-20 DIAGNOSIS — R296 Repeated falls: Secondary | ICD-10-CM | POA: Diagnosis not present

## 2021-10-20 DIAGNOSIS — N179 Acute kidney failure, unspecified: Secondary | ICD-10-CM | POA: Diagnosis not present

## 2021-10-20 DIAGNOSIS — I513 Intracardiac thrombosis, not elsewhere classified: Secondary | ICD-10-CM | POA: Diagnosis not present

## 2021-10-20 DIAGNOSIS — I69320 Aphasia following cerebral infarction: Secondary | ICD-10-CM | POA: Diagnosis not present

## 2021-10-20 DIAGNOSIS — I63412 Cerebral infarction due to embolism of left middle cerebral artery: Secondary | ICD-10-CM | POA: Diagnosis not present

## 2021-10-20 DIAGNOSIS — R4189 Other symptoms and signs involving cognitive functions and awareness: Secondary | ICD-10-CM | POA: Diagnosis not present

## 2021-10-20 DIAGNOSIS — I5042 Chronic combined systolic (congestive) and diastolic (congestive) heart failure: Secondary | ICD-10-CM | POA: Diagnosis not present

## 2021-10-20 DIAGNOSIS — I69351 Hemiplegia and hemiparesis following cerebral infarction affecting right dominant side: Secondary | ICD-10-CM | POA: Diagnosis not present

## 2021-10-20 DIAGNOSIS — I69321 Dysphasia following cerebral infarction: Secondary | ICD-10-CM | POA: Diagnosis not present

## 2021-10-20 DIAGNOSIS — T148XXA Other injury of unspecified body region, initial encounter: Secondary | ICD-10-CM | POA: Diagnosis not present

## 2021-10-21 DIAGNOSIS — N179 Acute kidney failure, unspecified: Secondary | ICD-10-CM | POA: Diagnosis not present

## 2021-10-21 DIAGNOSIS — I69351 Hemiplegia and hemiparesis following cerebral infarction affecting right dominant side: Secondary | ICD-10-CM | POA: Diagnosis not present

## 2021-10-21 DIAGNOSIS — I513 Intracardiac thrombosis, not elsewhere classified: Secondary | ICD-10-CM | POA: Diagnosis not present

## 2021-10-21 DIAGNOSIS — I63412 Cerebral infarction due to embolism of left middle cerebral artery: Secondary | ICD-10-CM | POA: Diagnosis not present

## 2021-10-21 DIAGNOSIS — M6281 Muscle weakness (generalized): Secondary | ICD-10-CM | POA: Diagnosis not present

## 2021-10-21 DIAGNOSIS — I5042 Chronic combined systolic (congestive) and diastolic (congestive) heart failure: Secondary | ICD-10-CM | POA: Diagnosis not present

## 2021-10-21 DIAGNOSIS — I69320 Aphasia following cerebral infarction: Secondary | ICD-10-CM | POA: Diagnosis not present

## 2021-10-21 DIAGNOSIS — I69321 Dysphasia following cerebral infarction: Secondary | ICD-10-CM | POA: Diagnosis not present

## 2021-10-23 ENCOUNTER — Other Ambulatory Visit: Payer: Self-pay

## 2021-10-23 ENCOUNTER — Ambulatory Visit (HOSPITAL_COMMUNITY)
Admission: RE | Admit: 2021-10-23 | Discharge: 2021-10-23 | Disposition: A | Discharge status: Home / Self Care | Payer: Medicare HMO | Source: Ambulatory Visit | Attending: Internal Medicine | Admitting: Internal Medicine

## 2021-10-23 DIAGNOSIS — I69320 Aphasia following cerebral infarction: Secondary | ICD-10-CM | POA: Diagnosis not present

## 2021-10-23 DIAGNOSIS — R1313 Dysphagia, pharyngeal phase: Secondary | ICD-10-CM | POA: Diagnosis not present

## 2021-10-23 DIAGNOSIS — I69321 Dysphasia following cerebral infarction: Secondary | ICD-10-CM | POA: Diagnosis not present

## 2021-10-23 DIAGNOSIS — R1310 Dysphagia, unspecified: Secondary | ICD-10-CM | POA: Insufficient documentation

## 2021-10-23 DIAGNOSIS — N179 Acute kidney failure, unspecified: Secondary | ICD-10-CM | POA: Diagnosis not present

## 2021-10-23 DIAGNOSIS — I63412 Cerebral infarction due to embolism of left middle cerebral artery: Secondary | ICD-10-CM | POA: Diagnosis not present

## 2021-10-23 DIAGNOSIS — I69351 Hemiplegia and hemiparesis following cerebral infarction affecting right dominant side: Secondary | ICD-10-CM | POA: Diagnosis not present

## 2021-10-23 DIAGNOSIS — M6281 Muscle weakness (generalized): Secondary | ICD-10-CM | POA: Diagnosis not present

## 2021-10-23 DIAGNOSIS — I513 Intracardiac thrombosis, not elsewhere classified: Secondary | ICD-10-CM | POA: Diagnosis not present

## 2021-10-23 DIAGNOSIS — I5042 Chronic combined systolic (congestive) and diastolic (congestive) heart failure: Secondary | ICD-10-CM | POA: Diagnosis not present

## 2021-10-23 NOTE — Progress Notes (Signed)
Modified Barium Swallow Progress Note  Patient Details  Name: Mindy Mills MRN: [id number] Date of Birth: June 08, 1951  Today's Date: 10/23/2021  Modified Barium Swallow completed.  Full report located under Chart Review in the Imaging Section.  Brief recommendations include the following:  Clinical Impression  Pt was seen for an MBS. Overall pt presents with a swallow delay initiated at the level of the vallecula resulting in moderate pharyngeal dysphagia with nectar thick liquids. Oral mechanism exam revealed pt unable to volitionally elicit cough or swallow and lingual coordination reduced. Pt exhibited extended oral phase c/b delayed oral transit with liquids and prolonged mastication with mechanical soft solids, however oral phase was functional. During pharyngeal phase, pt exhibited no penetration/aspiration with honey thick liquids but penetration to the level of the vocal folds which did not clear (PAS 5) with nectar thick. Pt stimulable for chin tuck, reducing score to PAS 2 (flash penetration) x2. Recommend continue with mechanical soft/honey thick liquid diet while working with Speech Pathologist to establish chin tuck technique with nectar thick liquid during therapy sessions for possible upgrade (nectar with chin tuck) at therapist's discretion.   Swallow Evaluation Recommendations   Recommended Consults: Consider esophageal assessment   SLP Diet Recommendations: Dysphagia 3 (Mech soft) solids;Honey thick liquids   Liquid Administration via: Cup   Medication Administration: Crushed with puree   Supervision: Full supervision/cueing for compensatory strategies   Compensations: Chin tuck (chin tuck with nectar thick liquids during therapy)   Postural Changes: Remain semi-upright after after feeds/meals (Comment);Seated upright at 90 degrees   Oral Care Recommendations: Oral care BID        Houston Siren 10/23/2021,3:25 PM  Orbie Pyo [place].Ed  Risk analyst [phone] Office [phone]

## 2021-10-24 DIAGNOSIS — N179 Acute kidney failure, unspecified: Secondary | ICD-10-CM | POA: Diagnosis not present

## 2021-10-24 DIAGNOSIS — M6281 Muscle weakness (generalized): Secondary | ICD-10-CM | POA: Diagnosis not present

## 2021-10-24 DIAGNOSIS — I5042 Chronic combined systolic (congestive) and diastolic (congestive) heart failure: Secondary | ICD-10-CM | POA: Diagnosis not present

## 2021-10-24 DIAGNOSIS — I69320 Aphasia following cerebral infarction: Secondary | ICD-10-CM | POA: Diagnosis not present

## 2021-10-24 DIAGNOSIS — I69351 Hemiplegia and hemiparesis following cerebral infarction affecting right dominant side: Secondary | ICD-10-CM | POA: Diagnosis not present

## 2021-10-24 DIAGNOSIS — I69321 Dysphasia following cerebral infarction: Secondary | ICD-10-CM | POA: Diagnosis not present

## 2021-10-24 DIAGNOSIS — I63412 Cerebral infarction due to embolism of left middle cerebral artery: Secondary | ICD-10-CM | POA: Diagnosis not present

## 2021-10-24 DIAGNOSIS — I513 Intracardiac thrombosis, not elsewhere classified: Secondary | ICD-10-CM | POA: Diagnosis not present

## 2021-10-25 DIAGNOSIS — I69320 Aphasia following cerebral infarction: Secondary | ICD-10-CM | POA: Diagnosis not present

## 2021-10-25 DIAGNOSIS — N179 Acute kidney failure, unspecified: Secondary | ICD-10-CM | POA: Diagnosis not present

## 2021-10-25 DIAGNOSIS — I5042 Chronic combined systolic (congestive) and diastolic (congestive) heart failure: Secondary | ICD-10-CM | POA: Diagnosis not present

## 2021-10-25 DIAGNOSIS — I63412 Cerebral infarction due to embolism of left middle cerebral artery: Secondary | ICD-10-CM | POA: Diagnosis not present

## 2021-10-25 DIAGNOSIS — I69321 Dysphasia following cerebral infarction: Secondary | ICD-10-CM | POA: Diagnosis not present

## 2021-10-25 DIAGNOSIS — I513 Intracardiac thrombosis, not elsewhere classified: Secondary | ICD-10-CM | POA: Diagnosis not present

## 2021-10-25 DIAGNOSIS — I69351 Hemiplegia and hemiparesis following cerebral infarction affecting right dominant side: Secondary | ICD-10-CM | POA: Diagnosis not present

## 2021-10-25 DIAGNOSIS — M6281 Muscle weakness (generalized): Secondary | ICD-10-CM | POA: Diagnosis not present

## 2021-10-28 DIAGNOSIS — N179 Acute kidney failure, unspecified: Secondary | ICD-10-CM | POA: Diagnosis not present

## 2021-10-28 DIAGNOSIS — M6281 Muscle weakness (generalized): Secondary | ICD-10-CM | POA: Diagnosis not present

## 2021-10-28 DIAGNOSIS — I69351 Hemiplegia and hemiparesis following cerebral infarction affecting right dominant side: Secondary | ICD-10-CM | POA: Diagnosis not present

## 2021-10-28 DIAGNOSIS — I69321 Dysphasia following cerebral infarction: Secondary | ICD-10-CM | POA: Diagnosis not present

## 2021-10-28 DIAGNOSIS — I513 Intracardiac thrombosis, not elsewhere classified: Secondary | ICD-10-CM | POA: Diagnosis not present

## 2021-10-28 DIAGNOSIS — I63412 Cerebral infarction due to embolism of left middle cerebral artery: Secondary | ICD-10-CM | POA: Diagnosis not present

## 2021-10-28 DIAGNOSIS — I69320 Aphasia following cerebral infarction: Secondary | ICD-10-CM | POA: Diagnosis not present

## 2021-10-28 DIAGNOSIS — I5042 Chronic combined systolic (congestive) and diastolic (congestive) heart failure: Secondary | ICD-10-CM | POA: Diagnosis not present

## 2021-10-29 DIAGNOSIS — I69320 Aphasia following cerebral infarction: Secondary | ICD-10-CM | POA: Diagnosis not present

## 2021-10-29 DIAGNOSIS — I63412 Cerebral infarction due to embolism of left middle cerebral artery: Secondary | ICD-10-CM | POA: Diagnosis not present

## 2021-10-29 DIAGNOSIS — N179 Acute kidney failure, unspecified: Secondary | ICD-10-CM | POA: Diagnosis not present

## 2021-10-29 DIAGNOSIS — M6281 Muscle weakness (generalized): Secondary | ICD-10-CM | POA: Diagnosis not present

## 2021-10-29 DIAGNOSIS — I5042 Chronic combined systolic (congestive) and diastolic (congestive) heart failure: Secondary | ICD-10-CM | POA: Diagnosis not present

## 2021-10-29 DIAGNOSIS — I69321 Dysphasia following cerebral infarction: Secondary | ICD-10-CM | POA: Diagnosis not present

## 2021-10-29 DIAGNOSIS — I69351 Hemiplegia and hemiparesis following cerebral infarction affecting right dominant side: Secondary | ICD-10-CM | POA: Diagnosis not present

## 2021-10-29 DIAGNOSIS — I513 Intracardiac thrombosis, not elsewhere classified: Secondary | ICD-10-CM | POA: Diagnosis not present

## 2021-10-30 DIAGNOSIS — Z5181 Encounter for therapeutic drug level monitoring: Secondary | ICD-10-CM | POA: Diagnosis not present

## 2021-10-30 DIAGNOSIS — F419 Anxiety disorder, unspecified: Secondary | ICD-10-CM | POA: Diagnosis not present

## 2021-10-30 DIAGNOSIS — I513 Intracardiac thrombosis, not elsewhere classified: Secondary | ICD-10-CM | POA: Diagnosis not present

## 2021-10-30 DIAGNOSIS — R4189 Other symptoms and signs involving cognitive functions and awareness: Secondary | ICD-10-CM | POA: Diagnosis not present

## 2021-10-30 DIAGNOSIS — I69321 Dysphasia following cerebral infarction: Secondary | ICD-10-CM | POA: Diagnosis not present

## 2021-10-30 DIAGNOSIS — I5042 Chronic combined systolic (congestive) and diastolic (congestive) heart failure: Secondary | ICD-10-CM | POA: Diagnosis not present

## 2021-10-30 DIAGNOSIS — M6281 Muscle weakness (generalized): Secondary | ICD-10-CM | POA: Diagnosis not present

## 2021-10-30 DIAGNOSIS — N179 Acute kidney failure, unspecified: Secondary | ICD-10-CM | POA: Diagnosis not present

## 2021-10-30 DIAGNOSIS — I69320 Aphasia following cerebral infarction: Secondary | ICD-10-CM | POA: Diagnosis not present

## 2021-10-30 DIAGNOSIS — I7091 Generalized atherosclerosis: Secondary | ICD-10-CM | POA: Diagnosis not present

## 2021-10-30 DIAGNOSIS — I63412 Cerebral infarction due to embolism of left middle cerebral artery: Secondary | ICD-10-CM | POA: Diagnosis not present

## 2021-10-30 DIAGNOSIS — I69351 Hemiplegia and hemiparesis following cerebral infarction affecting right dominant side: Secondary | ICD-10-CM | POA: Diagnosis not present

## 2021-10-31 DIAGNOSIS — I69321 Dysphasia following cerebral infarction: Secondary | ICD-10-CM | POA: Diagnosis not present

## 2021-10-31 DIAGNOSIS — I63412 Cerebral infarction due to embolism of left middle cerebral artery: Secondary | ICD-10-CM | POA: Diagnosis not present

## 2021-10-31 DIAGNOSIS — I5042 Chronic combined systolic (congestive) and diastolic (congestive) heart failure: Secondary | ICD-10-CM | POA: Diagnosis not present

## 2021-10-31 DIAGNOSIS — I513 Intracardiac thrombosis, not elsewhere classified: Secondary | ICD-10-CM | POA: Diagnosis not present

## 2021-10-31 DIAGNOSIS — I69351 Hemiplegia and hemiparesis following cerebral infarction affecting right dominant side: Secondary | ICD-10-CM | POA: Diagnosis not present

## 2021-10-31 DIAGNOSIS — I69320 Aphasia following cerebral infarction: Secondary | ICD-10-CM | POA: Diagnosis not present

## 2021-10-31 DIAGNOSIS — N179 Acute kidney failure, unspecified: Secondary | ICD-10-CM | POA: Diagnosis not present

## 2021-10-31 DIAGNOSIS — M6281 Muscle weakness (generalized): Secondary | ICD-10-CM | POA: Diagnosis not present

## 2021-11-01 DIAGNOSIS — I5042 Chronic combined systolic (congestive) and diastolic (congestive) heart failure: Secondary | ICD-10-CM | POA: Diagnosis not present

## 2021-11-01 DIAGNOSIS — M6281 Muscle weakness (generalized): Secondary | ICD-10-CM | POA: Diagnosis not present

## 2021-11-01 DIAGNOSIS — I513 Intracardiac thrombosis, not elsewhere classified: Secondary | ICD-10-CM | POA: Diagnosis not present

## 2021-11-01 DIAGNOSIS — I69321 Dysphasia following cerebral infarction: Secondary | ICD-10-CM | POA: Diagnosis not present

## 2021-11-01 DIAGNOSIS — N179 Acute kidney failure, unspecified: Secondary | ICD-10-CM | POA: Diagnosis not present

## 2021-11-01 DIAGNOSIS — I63412 Cerebral infarction due to embolism of left middle cerebral artery: Secondary | ICD-10-CM | POA: Diagnosis not present

## 2021-11-01 DIAGNOSIS — I69320 Aphasia following cerebral infarction: Secondary | ICD-10-CM | POA: Diagnosis not present

## 2021-11-01 DIAGNOSIS — I69351 Hemiplegia and hemiparesis following cerebral infarction affecting right dominant side: Secondary | ICD-10-CM | POA: Diagnosis not present

## 2021-11-04 DIAGNOSIS — F331 Major depressive disorder, recurrent, moderate: Secondary | ICD-10-CM | POA: Diagnosis not present

## 2021-11-04 DIAGNOSIS — I63412 Cerebral infarction due to embolism of left middle cerebral artery: Secondary | ICD-10-CM | POA: Diagnosis not present

## 2021-11-04 DIAGNOSIS — I5042 Chronic combined systolic (congestive) and diastolic (congestive) heart failure: Secondary | ICD-10-CM | POA: Diagnosis not present

## 2021-11-04 DIAGNOSIS — I513 Intracardiac thrombosis, not elsewhere classified: Secondary | ICD-10-CM | POA: Diagnosis not present

## 2021-11-04 DIAGNOSIS — M6281 Muscle weakness (generalized): Secondary | ICD-10-CM | POA: Diagnosis not present

## 2021-11-04 DIAGNOSIS — I69320 Aphasia following cerebral infarction: Secondary | ICD-10-CM | POA: Diagnosis not present

## 2021-11-04 DIAGNOSIS — N179 Acute kidney failure, unspecified: Secondary | ICD-10-CM | POA: Diagnosis not present

## 2021-11-04 DIAGNOSIS — F419 Anxiety disorder, unspecified: Secondary | ICD-10-CM | POA: Diagnosis not present

## 2021-11-04 DIAGNOSIS — I69351 Hemiplegia and hemiparesis following cerebral infarction affecting right dominant side: Secondary | ICD-10-CM | POA: Diagnosis not present

## 2021-11-04 DIAGNOSIS — I69321 Dysphasia following cerebral infarction: Secondary | ICD-10-CM | POA: Diagnosis not present

## 2021-11-05 DIAGNOSIS — M6281 Muscle weakness (generalized): Secondary | ICD-10-CM | POA: Diagnosis not present

## 2021-11-05 DIAGNOSIS — I63412 Cerebral infarction due to embolism of left middle cerebral artery: Secondary | ICD-10-CM | POA: Diagnosis not present

## 2021-11-05 DIAGNOSIS — I69320 Aphasia following cerebral infarction: Secondary | ICD-10-CM | POA: Diagnosis not present

## 2021-11-05 DIAGNOSIS — N179 Acute kidney failure, unspecified: Secondary | ICD-10-CM | POA: Diagnosis not present

## 2021-11-05 DIAGNOSIS — I5042 Chronic combined systolic (congestive) and diastolic (congestive) heart failure: Secondary | ICD-10-CM | POA: Diagnosis not present

## 2021-11-05 DIAGNOSIS — I513 Intracardiac thrombosis, not elsewhere classified: Secondary | ICD-10-CM | POA: Diagnosis not present

## 2021-11-05 DIAGNOSIS — I69351 Hemiplegia and hemiparesis following cerebral infarction affecting right dominant side: Secondary | ICD-10-CM | POA: Diagnosis not present

## 2021-11-05 DIAGNOSIS — I69321 Dysphasia following cerebral infarction: Secondary | ICD-10-CM | POA: Diagnosis not present

## 2021-11-06 DIAGNOSIS — I63412 Cerebral infarction due to embolism of left middle cerebral artery: Secondary | ICD-10-CM | POA: Diagnosis not present

## 2021-11-06 DIAGNOSIS — N179 Acute kidney failure, unspecified: Secondary | ICD-10-CM | POA: Diagnosis not present

## 2021-11-06 DIAGNOSIS — I69321 Dysphasia following cerebral infarction: Secondary | ICD-10-CM | POA: Diagnosis not present

## 2021-11-06 DIAGNOSIS — I69351 Hemiplegia and hemiparesis following cerebral infarction affecting right dominant side: Secondary | ICD-10-CM | POA: Diagnosis not present

## 2021-11-06 DIAGNOSIS — M6281 Muscle weakness (generalized): Secondary | ICD-10-CM | POA: Diagnosis not present

## 2021-11-06 DIAGNOSIS — I513 Intracardiac thrombosis, not elsewhere classified: Secondary | ICD-10-CM | POA: Diagnosis not present

## 2021-11-06 DIAGNOSIS — I5042 Chronic combined systolic (congestive) and diastolic (congestive) heart failure: Secondary | ICD-10-CM | POA: Diagnosis not present

## 2021-11-06 DIAGNOSIS — I69320 Aphasia following cerebral infarction: Secondary | ICD-10-CM | POA: Diagnosis not present

## 2021-11-07 DIAGNOSIS — I63412 Cerebral infarction due to embolism of left middle cerebral artery: Secondary | ICD-10-CM | POA: Diagnosis not present

## 2021-11-07 DIAGNOSIS — I69320 Aphasia following cerebral infarction: Secondary | ICD-10-CM | POA: Diagnosis not present

## 2021-11-07 DIAGNOSIS — I513 Intracardiac thrombosis, not elsewhere classified: Secondary | ICD-10-CM | POA: Diagnosis not present

## 2021-11-07 DIAGNOSIS — I69321 Dysphasia following cerebral infarction: Secondary | ICD-10-CM | POA: Diagnosis not present

## 2021-11-07 DIAGNOSIS — M6281 Muscle weakness (generalized): Secondary | ICD-10-CM | POA: Diagnosis not present

## 2021-11-07 DIAGNOSIS — I69351 Hemiplegia and hemiparesis following cerebral infarction affecting right dominant side: Secondary | ICD-10-CM | POA: Diagnosis not present

## 2021-11-07 DIAGNOSIS — N179 Acute kidney failure, unspecified: Secondary | ICD-10-CM | POA: Diagnosis not present

## 2021-11-07 DIAGNOSIS — I5042 Chronic combined systolic (congestive) and diastolic (congestive) heart failure: Secondary | ICD-10-CM | POA: Diagnosis not present

## 2021-11-08 DIAGNOSIS — I513 Intracardiac thrombosis, not elsewhere classified: Secondary | ICD-10-CM | POA: Diagnosis not present

## 2021-11-08 DIAGNOSIS — N179 Acute kidney failure, unspecified: Secondary | ICD-10-CM | POA: Diagnosis not present

## 2021-11-08 DIAGNOSIS — I69320 Aphasia following cerebral infarction: Secondary | ICD-10-CM | POA: Diagnosis not present

## 2021-11-08 DIAGNOSIS — I5042 Chronic combined systolic (congestive) and diastolic (congestive) heart failure: Secondary | ICD-10-CM | POA: Diagnosis not present

## 2021-11-08 DIAGNOSIS — I63412 Cerebral infarction due to embolism of left middle cerebral artery: Secondary | ICD-10-CM | POA: Diagnosis not present

## 2021-11-08 DIAGNOSIS — I69351 Hemiplegia and hemiparesis following cerebral infarction affecting right dominant side: Secondary | ICD-10-CM | POA: Diagnosis not present

## 2021-11-08 DIAGNOSIS — I69321 Dysphasia following cerebral infarction: Secondary | ICD-10-CM | POA: Diagnosis not present

## 2021-11-08 DIAGNOSIS — M6281 Muscle weakness (generalized): Secondary | ICD-10-CM | POA: Diagnosis not present

## 2021-11-10 DIAGNOSIS — I69321 Dysphasia following cerebral infarction: Secondary | ICD-10-CM | POA: Diagnosis not present

## 2021-11-10 DIAGNOSIS — M6281 Muscle weakness (generalized): Secondary | ICD-10-CM | POA: Diagnosis not present

## 2021-11-10 DIAGNOSIS — I63412 Cerebral infarction due to embolism of left middle cerebral artery: Secondary | ICD-10-CM | POA: Diagnosis not present

## 2021-11-10 DIAGNOSIS — I69320 Aphasia following cerebral infarction: Secondary | ICD-10-CM | POA: Diagnosis not present

## 2021-11-10 DIAGNOSIS — I69351 Hemiplegia and hemiparesis following cerebral infarction affecting right dominant side: Secondary | ICD-10-CM | POA: Diagnosis not present

## 2021-11-10 DIAGNOSIS — I513 Intracardiac thrombosis, not elsewhere classified: Secondary | ICD-10-CM | POA: Diagnosis not present

## 2021-11-10 DIAGNOSIS — N179 Acute kidney failure, unspecified: Secondary | ICD-10-CM | POA: Diagnosis not present

## 2021-11-10 DIAGNOSIS — I5042 Chronic combined systolic (congestive) and diastolic (congestive) heart failure: Secondary | ICD-10-CM | POA: Diagnosis not present

## 2021-11-11 DIAGNOSIS — I69320 Aphasia following cerebral infarction: Secondary | ICD-10-CM | POA: Diagnosis not present

## 2021-11-11 DIAGNOSIS — I69321 Dysphasia following cerebral infarction: Secondary | ICD-10-CM | POA: Diagnosis not present

## 2021-11-11 DIAGNOSIS — I5042 Chronic combined systolic (congestive) and diastolic (congestive) heart failure: Secondary | ICD-10-CM | POA: Diagnosis not present

## 2021-11-11 DIAGNOSIS — I63412 Cerebral infarction due to embolism of left middle cerebral artery: Secondary | ICD-10-CM | POA: Diagnosis not present

## 2021-11-11 DIAGNOSIS — N179 Acute kidney failure, unspecified: Secondary | ICD-10-CM | POA: Diagnosis not present

## 2021-11-11 DIAGNOSIS — I513 Intracardiac thrombosis, not elsewhere classified: Secondary | ICD-10-CM | POA: Diagnosis not present

## 2021-11-11 DIAGNOSIS — I69351 Hemiplegia and hemiparesis following cerebral infarction affecting right dominant side: Secondary | ICD-10-CM | POA: Diagnosis not present

## 2021-11-11 DIAGNOSIS — M6281 Muscle weakness (generalized): Secondary | ICD-10-CM | POA: Diagnosis not present

## 2021-11-12 DIAGNOSIS — I69321 Dysphasia following cerebral infarction: Secondary | ICD-10-CM | POA: Diagnosis not present

## 2021-11-12 DIAGNOSIS — N179 Acute kidney failure, unspecified: Secondary | ICD-10-CM | POA: Diagnosis not present

## 2021-11-12 DIAGNOSIS — M6281 Muscle weakness (generalized): Secondary | ICD-10-CM | POA: Diagnosis not present

## 2021-11-12 DIAGNOSIS — I5042 Chronic combined systolic (congestive) and diastolic (congestive) heart failure: Secondary | ICD-10-CM | POA: Diagnosis not present

## 2021-11-12 DIAGNOSIS — I63412 Cerebral infarction due to embolism of left middle cerebral artery: Secondary | ICD-10-CM | POA: Diagnosis not present

## 2021-11-12 DIAGNOSIS — I69351 Hemiplegia and hemiparesis following cerebral infarction affecting right dominant side: Secondary | ICD-10-CM | POA: Diagnosis not present

## 2021-11-12 DIAGNOSIS — I513 Intracardiac thrombosis, not elsewhere classified: Secondary | ICD-10-CM | POA: Diagnosis not present

## 2021-11-12 DIAGNOSIS — I69320 Aphasia following cerebral infarction: Secondary | ICD-10-CM | POA: Diagnosis not present

## 2021-11-13 DIAGNOSIS — I513 Intracardiac thrombosis, not elsewhere classified: Secondary | ICD-10-CM | POA: Diagnosis not present

## 2021-11-13 DIAGNOSIS — I69351 Hemiplegia and hemiparesis following cerebral infarction affecting right dominant side: Secondary | ICD-10-CM | POA: Diagnosis not present

## 2021-11-13 DIAGNOSIS — I69320 Aphasia following cerebral infarction: Secondary | ICD-10-CM | POA: Diagnosis not present

## 2021-11-13 DIAGNOSIS — I5042 Chronic combined systolic (congestive) and diastolic (congestive) heart failure: Secondary | ICD-10-CM | POA: Diagnosis not present

## 2021-11-13 DIAGNOSIS — M6281 Muscle weakness (generalized): Secondary | ICD-10-CM | POA: Diagnosis not present

## 2021-11-13 DIAGNOSIS — I63412 Cerebral infarction due to embolism of left middle cerebral artery: Secondary | ICD-10-CM | POA: Diagnosis not present

## 2021-11-13 DIAGNOSIS — I69321 Dysphasia following cerebral infarction: Secondary | ICD-10-CM | POA: Diagnosis not present

## 2021-11-13 DIAGNOSIS — N179 Acute kidney failure, unspecified: Secondary | ICD-10-CM | POA: Diagnosis not present

## 2021-11-14 DIAGNOSIS — F418 Other specified anxiety disorders: Secondary | ICD-10-CM | POA: Diagnosis not present

## 2021-11-14 DIAGNOSIS — F331 Major depressive disorder, recurrent, moderate: Secondary | ICD-10-CM | POA: Diagnosis not present

## 2021-11-16 DIAGNOSIS — I5042 Chronic combined systolic (congestive) and diastolic (congestive) heart failure: Secondary | ICD-10-CM | POA: Diagnosis not present

## 2021-11-16 DIAGNOSIS — I63412 Cerebral infarction due to embolism of left middle cerebral artery: Secondary | ICD-10-CM | POA: Diagnosis not present

## 2021-11-16 DIAGNOSIS — M6281 Muscle weakness (generalized): Secondary | ICD-10-CM | POA: Diagnosis not present

## 2021-11-16 DIAGNOSIS — N179 Acute kidney failure, unspecified: Secondary | ICD-10-CM | POA: Diagnosis not present

## 2021-11-16 DIAGNOSIS — I69351 Hemiplegia and hemiparesis following cerebral infarction affecting right dominant side: Secondary | ICD-10-CM | POA: Diagnosis not present

## 2021-11-16 DIAGNOSIS — I69321 Dysphasia following cerebral infarction: Secondary | ICD-10-CM | POA: Diagnosis not present

## 2021-11-16 DIAGNOSIS — I513 Intracardiac thrombosis, not elsewhere classified: Secondary | ICD-10-CM | POA: Diagnosis not present

## 2021-11-16 DIAGNOSIS — I69320 Aphasia following cerebral infarction: Secondary | ICD-10-CM | POA: Diagnosis not present

## 2021-11-18 DIAGNOSIS — I513 Intracardiac thrombosis, not elsewhere classified: Secondary | ICD-10-CM | POA: Diagnosis not present

## 2021-11-18 DIAGNOSIS — I69321 Dysphasia following cerebral infarction: Secondary | ICD-10-CM | POA: Diagnosis not present

## 2021-11-18 DIAGNOSIS — I69320 Aphasia following cerebral infarction: Secondary | ICD-10-CM | POA: Diagnosis not present

## 2021-11-18 DIAGNOSIS — N179 Acute kidney failure, unspecified: Secondary | ICD-10-CM | POA: Diagnosis not present

## 2021-11-18 DIAGNOSIS — I5042 Chronic combined systolic (congestive) and diastolic (congestive) heart failure: Secondary | ICD-10-CM | POA: Diagnosis not present

## 2021-11-18 DIAGNOSIS — M6281 Muscle weakness (generalized): Secondary | ICD-10-CM | POA: Diagnosis not present

## 2021-11-18 DIAGNOSIS — I63412 Cerebral infarction due to embolism of left middle cerebral artery: Secondary | ICD-10-CM | POA: Diagnosis not present

## 2021-11-18 DIAGNOSIS — I69351 Hemiplegia and hemiparesis following cerebral infarction affecting right dominant side: Secondary | ICD-10-CM | POA: Diagnosis not present

## 2021-12-01 DIAGNOSIS — E785 Hyperlipidemia, unspecified: Secondary | ICD-10-CM | POA: Diagnosis not present

## 2021-12-01 DIAGNOSIS — F419 Anxiety disorder, unspecified: Secondary | ICD-10-CM | POA: Diagnosis not present

## 2021-12-01 DIAGNOSIS — I4891 Unspecified atrial fibrillation: Secondary | ICD-10-CM | POA: Diagnosis not present

## 2021-12-03 DIAGNOSIS — D511 Vitamin B12 deficiency anemia due to selective vitamin B12 malabsorption with proteinuria: Secondary | ICD-10-CM | POA: Diagnosis not present

## 2021-12-03 DIAGNOSIS — I10 Essential (primary) hypertension: Secondary | ICD-10-CM | POA: Diagnosis not present

## 2021-12-03 DIAGNOSIS — E559 Vitamin D deficiency, unspecified: Secondary | ICD-10-CM | POA: Diagnosis not present

## 2021-12-03 DIAGNOSIS — E0800 Diabetes mellitus due to underlying condition with hyperosmolarity without nonketotic hyperglycemic-hyperosmolar coma (NKHHC): Secondary | ICD-10-CM | POA: Diagnosis not present

## 2021-12-08 DIAGNOSIS — F331 Major depressive disorder, recurrent, moderate: Secondary | ICD-10-CM | POA: Diagnosis not present

## 2021-12-08 DIAGNOSIS — F418 Other specified anxiety disorders: Secondary | ICD-10-CM | POA: Diagnosis not present

## 2021-12-29 DIAGNOSIS — R4189 Other symptoms and signs involving cognitive functions and awareness: Secondary | ICD-10-CM | POA: Diagnosis not present

## 2021-12-29 DIAGNOSIS — F419 Anxiety disorder, unspecified: Secondary | ICD-10-CM | POA: Diagnosis not present

## 2022-01-01 DIAGNOSIS — T148XXA Other injury of unspecified body region, initial encounter: Secondary | ICD-10-CM | POA: Diagnosis not present

## 2022-01-01 DIAGNOSIS — J9601 Acute respiratory failure with hypoxia: Secondary | ICD-10-CM | POA: Diagnosis not present

## 2022-01-01 DIAGNOSIS — R4189 Other symptoms and signs involving cognitive functions and awareness: Secondary | ICD-10-CM | POA: Diagnosis not present

## 2022-01-01 DIAGNOSIS — J189 Pneumonia, unspecified organism: Secondary | ICD-10-CM | POA: Diagnosis not present

## 2022-01-01 DIAGNOSIS — R4701 Aphasia: Secondary | ICD-10-CM | POA: Diagnosis not present

## 2022-01-01 DIAGNOSIS — J449 Chronic obstructive pulmonary disease, unspecified: Secondary | ICD-10-CM | POA: Diagnosis not present

## 2022-01-01 DIAGNOSIS — F419 Anxiety disorder, unspecified: Secondary | ICD-10-CM | POA: Diagnosis not present

## 2022-01-01 DIAGNOSIS — I10 Essential (primary) hypertension: Secondary | ICD-10-CM | POA: Diagnosis not present

## 2022-01-01 DIAGNOSIS — N950 Postmenopausal bleeding: Secondary | ICD-10-CM | POA: Diagnosis not present

## 2022-01-01 DIAGNOSIS — U071 COVID-19: Secondary | ICD-10-CM | POA: Diagnosis not present

## 2022-01-28 DEATH — deceased
